# Patient Record
Sex: Female | Born: 1960
Health system: Southern US, Community
[De-identification: ages and names within clinical notes are randomized; demographics above are authoritative.]

## PROBLEM LIST (undated history)

## (undated) DIAGNOSIS — S88111A Complete traumatic amputation at level between knee and ankle, right lower leg, initial encounter: Secondary | ICD-10-CM

## (undated) DIAGNOSIS — Z800 Family history of malignant neoplasm of digestive organs: Secondary | ICD-10-CM

## (undated) DIAGNOSIS — C55 Malignant neoplasm of uterus, part unspecified: Secondary | ICD-10-CM

## (undated) DIAGNOSIS — J449 Chronic obstructive pulmonary disease, unspecified: Secondary | ICD-10-CM

## (undated) DIAGNOSIS — E669 Obesity, unspecified: Secondary | ICD-10-CM

## (undated) DIAGNOSIS — M869 Osteomyelitis, unspecified: Secondary | ICD-10-CM

## (undated) DIAGNOSIS — E039 Hypothyroidism, unspecified: Secondary | ICD-10-CM

## (undated) DIAGNOSIS — D62 Acute posthemorrhagic anemia: Secondary | ICD-10-CM

## (undated) DIAGNOSIS — E119 Type 2 diabetes mellitus without complications: Secondary | ICD-10-CM

## (undated) DIAGNOSIS — F329 Major depressive disorder, single episode, unspecified: Secondary | ICD-10-CM

## (undated) DIAGNOSIS — T8781 Dehiscence of amputation stump: Secondary | ICD-10-CM

## (undated) DIAGNOSIS — E441 Mild protein-calorie malnutrition: Secondary | ICD-10-CM

## (undated) DIAGNOSIS — M1990 Unspecified osteoarthritis, unspecified site: Secondary | ICD-10-CM

## (undated) DIAGNOSIS — Z89511 Acquired absence of right leg below knee: Secondary | ICD-10-CM

## (undated) DIAGNOSIS — F32A Depression, unspecified: Secondary | ICD-10-CM

## (undated) DIAGNOSIS — S88111D Complete traumatic amputation at level between knee and ankle, right lower leg, subsequent encounter: Secondary | ICD-10-CM

## (undated) DIAGNOSIS — I739 Peripheral vascular disease, unspecified: Secondary | ICD-10-CM

## (undated) DIAGNOSIS — N390 Urinary tract infection, site not specified: Secondary | ICD-10-CM

## (undated) DIAGNOSIS — K219 Gastro-esophageal reflux disease without esophagitis: Secondary | ICD-10-CM

## (undated) DIAGNOSIS — E785 Hyperlipidemia, unspecified: Secondary | ICD-10-CM

## (undated) DIAGNOSIS — I82409 Acute embolism and thrombosis of unspecified deep veins of unspecified lower extremity: Secondary | ICD-10-CM

## (undated) DIAGNOSIS — I10 Essential (primary) hypertension: Secondary | ICD-10-CM

## (undated) HISTORY — DX: Major depressive disorder, single episode, unspecified: F32.9

## (undated) HISTORY — DX: Acute posthemorrhagic anemia: D62

## (undated) HISTORY — DX: Essential (primary) hypertension: I10

## (undated) HISTORY — DX: Urinary tract infection, site not specified: N39.0

## (undated) HISTORY — PX: TONSILLECTOMY: SUR1361

## (undated) HISTORY — DX: Peripheral vascular disease, unspecified: I73.9

## (undated) HISTORY — DX: Malignant neoplasm of uterus, part unspecified: C55

## (undated) HISTORY — DX: Depression, unspecified: F32.A

## (undated) HISTORY — DX: Dehiscence of amputation stump: T87.81

## (undated) HISTORY — DX: Obesity, unspecified: E66.9

## (undated) HISTORY — PX: FEMORAL BYPASS: SHX50

## (undated) HISTORY — DX: Chronic obstructive pulmonary disease, unspecified: J44.9

## (undated) HISTORY — DX: Mild protein-calorie malnutrition: E44.1

## (undated) HISTORY — DX: Complete traumatic amputation at level between knee and ankle, right lower leg, subsequent encounter: S88.111D

## (undated) HISTORY — DX: Hyperlipidemia, unspecified: E78.5

## (undated) HISTORY — DX: Gastro-esophageal reflux disease without esophagitis: K21.9

## (undated) HISTORY — PX: ABDOMINAL HYSTERECTOMY: SHX81

## (undated) HISTORY — DX: Hypothyroidism, unspecified: E03.9

## (undated) HISTORY — DX: Acquired absence of right leg below knee: Z89.511

## (undated) HISTORY — DX: Acute embolism and thrombosis of unspecified deep veins of unspecified lower extremity: I82.409

## (undated) HISTORY — PX: LUMBAR DISC SURGERY: SHX700

## (undated) HISTORY — PX: TOE AMPUTATION: SHX809

## (undated) HISTORY — DX: Family history of malignant neoplasm of digestive organs: Z80.0

## (undated) HISTORY — DX: Type 2 diabetes mellitus without complications: E11.9

---
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
## 1999-01-12 ENCOUNTER — Encounter: Payer: Self-pay | Admitting: Neurosurgery

## 1999-01-12 ENCOUNTER — Ambulatory Visit (HOSPITAL_COMMUNITY): Admission: RE | Admit: 1999-01-12 | Discharge: 1999-01-12 | Payer: Self-pay | Admitting: Neurosurgery

## 1999-01-20 ENCOUNTER — Encounter: Admission: RE | Admit: 1999-01-20 | Discharge: 1999-04-01 | Payer: Self-pay | Admitting: Neurosurgery

## 2003-04-06 ENCOUNTER — Other Ambulatory Visit: Admission: RE | Admit: 2003-04-06 | Discharge: 2003-04-06 | Payer: Self-pay | Admitting: Family Medicine

## 2003-05-18 ENCOUNTER — Encounter: Admission: RE | Admit: 2003-05-18 | Discharge: 2003-05-18 | Payer: Self-pay | Admitting: Family Medicine

## 2005-06-17 ENCOUNTER — Other Ambulatory Visit: Admission: RE | Admit: 2005-06-17 | Discharge: 2005-06-17 | Payer: Self-pay | Admitting: Family Medicine

## 2006-01-10 ENCOUNTER — Emergency Department (HOSPITAL_COMMUNITY): Admission: EM | Admit: 2006-01-10 | Discharge: 2006-01-10 | Payer: Self-pay | Admitting: *Deleted

## 2006-01-20 ENCOUNTER — Ambulatory Visit (HOSPITAL_COMMUNITY): Admission: RE | Admit: 2006-01-20 | Discharge: 2006-01-20 | Payer: Self-pay | Admitting: Vascular Surgery

## 2006-01-22 ENCOUNTER — Inpatient Hospital Stay (HOSPITAL_COMMUNITY): Admission: RE | Admit: 2006-01-22 | Discharge: 2006-01-26 | Payer: Self-pay | Admitting: Vascular Surgery

## 2006-04-16 ENCOUNTER — Encounter (INDEPENDENT_AMBULATORY_CARE_PROVIDER_SITE_OTHER): Payer: Self-pay | Admitting: Specialist

## 2006-04-16 ENCOUNTER — Ambulatory Visit (HOSPITAL_COMMUNITY): Admission: RE | Admit: 2006-04-16 | Discharge: 2006-04-16 | Payer: Self-pay | Admitting: Vascular Surgery

## 2006-07-01 ENCOUNTER — Encounter: Payer: Self-pay | Admitting: Vascular Surgery

## 2006-07-01 ENCOUNTER — Inpatient Hospital Stay (HOSPITAL_COMMUNITY): Admission: AD | Admit: 2006-07-01 | Discharge: 2006-07-02 | Payer: Self-pay | Admitting: Vascular Surgery

## 2006-08-02 ENCOUNTER — Ambulatory Visit: Payer: Self-pay | Admitting: Vascular Surgery

## 2006-08-25 ENCOUNTER — Ambulatory Visit: Payer: Self-pay | Admitting: Vascular Surgery

## 2006-09-01 ENCOUNTER — Encounter: Admission: RE | Admit: 2006-09-01 | Discharge: 2006-09-01 | Payer: Self-pay | Admitting: Vascular Surgery

## 2006-09-09 ENCOUNTER — Ambulatory Visit: Payer: Self-pay | Admitting: *Deleted

## 2006-09-24 ENCOUNTER — Ambulatory Visit: Payer: Self-pay | Admitting: Vascular Surgery

## 2006-09-24 ENCOUNTER — Observation Stay (HOSPITAL_COMMUNITY): Admission: EM | Admit: 2006-09-24 | Discharge: 2006-09-25 | Payer: Self-pay | Admitting: *Deleted

## 2006-09-24 ENCOUNTER — Ambulatory Visit (HOSPITAL_COMMUNITY): Admission: RE | Admit: 2006-09-24 | Discharge: 2006-09-24 | Payer: Self-pay | Admitting: Vascular Surgery

## 2006-09-27 ENCOUNTER — Ambulatory Visit: Payer: Self-pay | Admitting: Vascular Surgery

## 2006-10-25 ENCOUNTER — Ambulatory Visit: Payer: Self-pay | Admitting: Vascular Surgery

## 2006-11-04 ENCOUNTER — Ambulatory Visit: Payer: Self-pay | Admitting: Vascular Surgery

## 2006-12-06 ENCOUNTER — Ambulatory Visit: Payer: Self-pay | Admitting: Vascular Surgery

## 2006-12-08 ENCOUNTER — Ambulatory Visit: Payer: Self-pay | Admitting: Vascular Surgery

## 2006-12-08 ENCOUNTER — Inpatient Hospital Stay (HOSPITAL_COMMUNITY): Admission: RE | Admit: 2006-12-08 | Discharge: 2006-12-11 | Payer: Self-pay | Admitting: Vascular Surgery

## 2006-12-09 ENCOUNTER — Encounter: Payer: Self-pay | Admitting: Vascular Surgery

## 2006-12-27 ENCOUNTER — Ambulatory Visit: Payer: Self-pay | Admitting: Vascular Surgery

## 2007-01-04 ENCOUNTER — Ambulatory Visit: Payer: Self-pay | Admitting: *Deleted

## 2007-01-04 ENCOUNTER — Ambulatory Visit: Payer: Self-pay | Admitting: Vascular Surgery

## 2007-01-07 ENCOUNTER — Ambulatory Visit: Payer: Self-pay | Admitting: Vascular Surgery

## 2007-01-08 ENCOUNTER — Inpatient Hospital Stay (HOSPITAL_COMMUNITY): Admission: RE | Admit: 2007-01-08 | Discharge: 2007-01-18 | Payer: Self-pay | Admitting: Vascular Surgery

## 2007-01-10 ENCOUNTER — Encounter: Payer: Self-pay | Admitting: Vascular Surgery

## 2007-01-11 ENCOUNTER — Ambulatory Visit: Payer: Self-pay | Admitting: Vascular Surgery

## 2007-01-28 ENCOUNTER — Ambulatory Visit: Payer: Self-pay | Admitting: Vascular Surgery

## 2007-03-04 ENCOUNTER — Ambulatory Visit: Payer: Self-pay | Admitting: Vascular Surgery

## 2007-03-16 ENCOUNTER — Encounter: Admission: RE | Admit: 2007-03-16 | Discharge: 2007-03-16 | Payer: Self-pay | Admitting: Family Medicine

## 2007-04-05 ENCOUNTER — Emergency Department (HOSPITAL_COMMUNITY): Admission: EM | Admit: 2007-04-05 | Discharge: 2007-04-05 | Payer: Self-pay | Admitting: *Deleted

## 2007-04-29 ENCOUNTER — Ambulatory Visit: Payer: Self-pay | Admitting: Vascular Surgery

## 2007-06-21 ENCOUNTER — Ambulatory Visit: Payer: Self-pay | Admitting: Gastroenterology

## 2007-08-05 ENCOUNTER — Ambulatory Visit: Payer: Self-pay | Admitting: Vascular Surgery

## 2007-08-30 ENCOUNTER — Encounter: Admission: RE | Admit: 2007-08-30 | Discharge: 2007-11-28 | Payer: Self-pay | Admitting: Family Medicine

## 2008-03-30 ENCOUNTER — Ambulatory Visit: Payer: Self-pay | Admitting: Vascular Surgery

## 2008-04-18 DIAGNOSIS — I739 Peripheral vascular disease, unspecified: Secondary | ICD-10-CM

## 2008-04-18 DIAGNOSIS — L089 Local infection of the skin and subcutaneous tissue, unspecified: Secondary | ICD-10-CM

## 2008-04-18 DIAGNOSIS — E669 Obesity, unspecified: Secondary | ICD-10-CM | POA: Insufficient documentation

## 2008-04-18 DIAGNOSIS — E785 Hyperlipidemia, unspecified: Secondary | ICD-10-CM

## 2008-04-18 DIAGNOSIS — E1169 Type 2 diabetes mellitus with other specified complication: Secondary | ICD-10-CM | POA: Insufficient documentation

## 2008-04-18 DIAGNOSIS — E11628 Type 2 diabetes mellitus with other skin complications: Secondary | ICD-10-CM

## 2008-04-18 DIAGNOSIS — F17200 Nicotine dependence, unspecified, uncomplicated: Secondary | ICD-10-CM

## 2008-04-18 DIAGNOSIS — I10 Essential (primary) hypertension: Secondary | ICD-10-CM | POA: Insufficient documentation

## 2008-09-14 ENCOUNTER — Ambulatory Visit: Payer: Self-pay | Admitting: Vascular Surgery

## 2009-04-17 ENCOUNTER — Ambulatory Visit: Payer: Self-pay | Admitting: Surgery

## 2009-04-17 ENCOUNTER — Emergency Department (HOSPITAL_COMMUNITY): Admission: EM | Admit: 2009-04-17 | Discharge: 2009-04-17 | Payer: Self-pay | Admitting: Family Medicine

## 2009-04-17 ENCOUNTER — Encounter (INDEPENDENT_AMBULATORY_CARE_PROVIDER_SITE_OTHER): Payer: Self-pay | Admitting: Emergency Medicine

## 2009-04-17 ENCOUNTER — Emergency Department (HOSPITAL_COMMUNITY): Admission: EM | Admit: 2009-04-17 | Discharge: 2009-04-17 | Payer: Self-pay | Admitting: Emergency Medicine

## 2009-05-17 ENCOUNTER — Ambulatory Visit: Payer: Self-pay | Admitting: Vascular Surgery

## 2009-11-29 ENCOUNTER — Ambulatory Visit: Payer: Self-pay | Admitting: Vascular Surgery

## 2010-06-03 ENCOUNTER — Ambulatory Visit: Payer: Self-pay | Admitting: Vascular Surgery

## 2010-07-08 ENCOUNTER — Ambulatory Visit
Admission: RE | Admit: 2010-07-08 | Discharge: 2010-07-08 | Payer: Self-pay | Source: Home / Self Care | Attending: Vascular Surgery | Admitting: Vascular Surgery

## 2010-07-09 NOTE — Assessment & Plan Note (Addendum)
OFFICE VISIT  Pam, Cox DOB:  09/10/1960                                       07/08/2010 ZOXWR#:[id number]  The patient presents today for followup of her lower extremity peripheral vascular occlusive disease.  She has a very complex past history, presenting initially with atheroemboli to her right fourth and fifth toes.  This was in August 2007.  Workup revealed high-grade stenosis in her superficial femoral artery.  It was felt she had embolized from this.  She underwent right femoral popliteal bypass and eventual toe amputation.  She has had multiple Delina Kruczek failures of her fem- pop bypass.  Most recently, she had redo bypass with vein from her left leg.  This was in August 2008.  On her last protocol visit 6 months ago, she did have drops in her ankle-arm index but did have a patent bypass and it was recommended that we continue observation only.  She was seen in our office yesterday for vascular lab followup and was found to have an occlusion of her graft.  Interestingly, her last ankle-arm index was 0.37 and with the graft occluded by duplex is 0.39.  Fortunately, she is having no symptoms related to this and really is walking without any claudication symptoms, having no rest pain, and is quite comfortable.  I am quite pleased with this and explained to the patient that this really is the best possible outcome since she is not at any risk for occlusion with the graft failed.  She, on physical exam, has well-perfused feet bilaterally with no evidence of tissue loss.  I discussed this with the patient and recommended continued observation only.  She knows to notify us immediately should she develop any tissue loss or rest pain. Otherwise, we will see her again in 6 months for continued followup of her moderate to severe ischemia.    [name], M.D. Electronically Signed  TFE/MEDQ  D:  07/08/2010  T:  07/09/2010  Job:  [id number]

## 2010-07-17 NOTE — Procedures (Unsigned)
BYPASS GRAFT EVALUATION  INDICATION:  Followup right fem-pop graft.  HISTORY: The patient fell in December and reports bruising of distal surgical scar but states she is feeling better than ever. Diabetes:  Yes. Cardiac:  No. Hypertension:  Yes. Smoking:  Current. Previous Surgery:  Right fem-pop bypass graft with multiple revisions, most recent performed on 01/21/2007, it was a right tibial embolectomy/VPA/PTA.  SINGLE LEVEL ARTERIAL EXAM                              RIGHT              LEFT Brachial:                    127                134 Anterior tibial:             46                 102 Posterior tibial:            52                 105 Peroneal: Ankle/brachial index:        0.39               0.78  PREVIOUS ABI:  Date:  11/29/2009  RIGHT:  0.37  LEFT:  0.79  LOWER EXTREMITY BYPASS GRAFT DUPLEX EXAM:  DUPLEX:  Patent right CFA however no flow can be detected in the right fem-pop graft. Arterial flow appears to be provided to the lower extremity via the right deep femoral artery. Difficult visualization due to groin scarring.  IMPRESSION: 1. Stable bilateral ankle brachial index. 2. Occluded right femoral-popliteal graft. 3. Results are communicated to Dr. Myra Gianotti.  The patient is set up to     see Dr. Arbie Cookey at her earliest convenience.  ___________________________________________ [name], M.D.  LT/MEDQ  D:  07/07/2010  T:  07/07/2010  Job:  [id number]

## 2010-09-10 LAB — POCT I-STAT, CHEM 8
Chloride: 104 mEq/L (ref 96–112)
HCT: 39 % (ref 36.0–46.0)
Potassium: 3.6 mEq/L (ref 3.5–5.1)
Sodium: 139 mEq/L (ref 135–145)

## 2010-09-10 LAB — APTT: aPTT: 44 seconds — ABNORMAL HIGH (ref 24–37)

## 2010-09-10 LAB — PROTIME-INR: INR: 2.85 — ABNORMAL HIGH (ref 0.00–1.49)

## 2010-10-21 NOTE — Procedures (Signed)
BYPASS GRAFT EVALUATION   INDICATION:  Followup evaluation of right leg bypass graft, right thigh  pain.   HISTORY:  Diabetes:  Orally controlled.  Cardiac:  No.  Hypertension:  Yes.  Smoking:  Two packs per day for 30 years.  Previous Surgery:  Thrombectomy and revision of distal anastomosis of  right fem-pop bypass graft on June 30, 2006; PTA of bypass graft on  September 24, 2006, by Dr. Arbie Cookey.   SINGLE LEVEL ARTERIAL EXAM                               RIGHT              LEFT  Brachial:                    148.               168.  Anterior tibial:             39.                71.  Posterior tibial:            52.                107.  Peroneal:  Ankle/brachial index:        0.31.              0.64.   PREVIOUS ABI:  Date:  Nov 04, 2006.  RIGHT:  0.90  LEFT:  0.62   LOWER EXTREMITY BYPASS GRAFT DUPLEX EXAM:   DUPLEX:  No flow is identified within the right fem-pop bypass graft.   IMPRESSION:  1. Occluded right femoral-popliteal bypass graft.  2. Left ankle-brachial index (ABI) is stable from previous study.   ___________________________________________  [name], M.D.   MC/MEDQ  D:  12/06/2006  T:  12/06/2006  Job:  [id number]

## 2010-10-21 NOTE — Assessment & Plan Note (Signed)
OFFICE VISIT   DARWIN, GUASTELLA  DOB:  Feb 18, 1961                                       05/17/2009  UYQIH#:[id number]   Patient presents today for continued follow-up of her right fem-pop  bypass with multiple restenoses, tibial embolectomies in the past.  She  had been maintained on Coumadin for her graft patency.  She recently was  placed on Crestor and had a marked elevation in her Coumadin level with  a resulting hematoma in her right anterior thigh.  This is now resolved.   She underwent noninvasive vascular laboratory studies today is in our  office, and this reveals no change in her ankle-arm index bilaterally.  Her ankle-arm index on the right is 0.45, on the left is 0.76.  Her  graft is patent throughout its course by ultrasound.   She will continue her follow-up in our vascular lab protocol.  I feel it  is okay for her to undergo oral surgery as indicated.  She tells me that  Dr. Manson Passey does not need to stop her Coumadin for this, since he is bone-  grafting after he pulls her tooth.  She will continue to see [place] in our  vascular lab protocol.   [name], M.D.  Electronically Signed   TFE/MEDQ  D:  05/17/2009  T:  05/21/2009  Job:  [id number]   cc:   Grant Ruts., D.D.S.

## 2010-10-21 NOTE — Procedures (Signed)
BYPASS GRAFT EVALUATION   INDICATION:  Followup, status post revision to the right fem-pop bypass  graft as well as tibial embolectomy VPA/PTA from August, 2008 by Dr.  Arbie Cookey.   HISTORY:  Diabetes:  Yes.  Cardiac:  No.  Hypertension:  Yes.  Smoking:  Quit.  Previous Surgery:  See above.  In July, 2008, redo right fem-pop bypass  graft.  In April, 2008, revision of right fem-pop bypass graft.  In  January, 2008, right fem-pop graft.   SINGLE LEVEL ARTERIAL EXAM                               RIGHT              LEFT  Brachial:                    165                159  Anterior tibial:             97                 125  Posterior tibial:            105                125  Peroneal:                    123  Ankle/brachial index:        0.75               0.76   PREVIOUS ABI:  Date: 03/04/2007  RIGHT:  0.67  LEFT:  0.52   LOWER EXTREMITY BYPASS GRAFT DUPLEX EXAM:  See attached sheet for  velocities.   DUPLEX:  Doppler arterial waveforms appear biphasic proximal to, within,  and distal to bypass graft.  Nonvascularized complex structures  suggestive of hematomas in the right groin and the medial aspect of the  knee.   IMPRESSION:  1. Stable bilateral ankle brachial indices from previous study with      strong monophasic (near biphasic) flow bilaterally.  2. Patent right femoropopliteal bypass graft.  3. Technically difficult to image sections due to depth and body      habitus.   ___________________________________________  [name], M.D.   AS/MEDQ  D:  04/29/2007  T:  04/30/2007  Job:  [id number]

## 2010-10-21 NOTE — Assessment & Plan Note (Signed)
OFFICE VISIT   [name], [name]  DOB:  06-Apr-1961                                       03/04/2007  ZOXWR#:[id number]   The patient presents today for followup of her right fem-pop bypass.  She has had multiple prior graft difficulties with occlusions.  She did  have new bypass of the right fem-pop with vein to her left leg on  01/11/2007.  She quit smoking and has not smoked since July of 2008.  She looks quite good today.  She has good spirits.  She has completely  healed all of her incisions.  She is walking with no claudication  symptoms.  She does have a palpable popliteal pulse.  Ankle brachial  index is stable at 0.67 on the right and 0.52 on the left.  She is to  notify us if she has any difficulty.  Otherwise, we will see her again  in November with repeat vascular lab protocol.  She is on Coumadin  therapy and we will continue this, at least on the short term.   [name], M.D.  Electronically Signed   TFE/MEDQ  D:  03/04/2007  T:  03/04/2007  Job:  [id number]

## 2010-10-21 NOTE — Assessment & Plan Note (Signed)
OFFICE VISIT   ELANTRA, CAPRARA  DOB:  Feb 11, 1961                                       08/05/2007  LKGMW#:[id number]   [name] presents today for continued followup of her right  fem-pop bypass.  She has a new diagnosis of hypothyroidism and has been  on thyroid replacement medication for 2 weeks.  She is concerned  regarding her continued weight gain.  Fortunately,  she continues to not  smoke.  She does report total legs, both sides, giving out with  walking and also reports numbness and tingling in her thighs with  walking and at rest.  She does have known degenerative disk disease and  I suspect this is the etiology for this.   PHYSICAL EXAM:  She does have obesity.  I do not palpable pedal pulses.  She underwent Duplex today.  This does show patent bypass graft on the  right.  She does have slightly elevated velocities at the distal  anastomosis, ankle arm index 0.5 on the right and 0.7 on the left.   I have encouraged [name] to continue her exercise program as  much as possible and plan to see her again in 3 months for continued  graft followup.   [name], M.D.  Electronically Signed   TFE/MEDQ  D:  08/05/2007  T:  08/08/2007  Job:  [id number]

## 2010-10-21 NOTE — Assessment & Plan Note (Signed)
OFFICE VISIT   [name], [name]  DOB:  1960/12/28                                       11/29/2009  MVHQI#:[id number]   Patient presents today for continued follow-up of her lower extremity  insufficiency and also for noninvasive vascular laboratory study.  She  has a patent right femoral to popliteal bypass.  She has multiple  revisions of this and has known tibial disease.  She reports that she is  in a swimming program and is walking with very mild claudication  symptoms.  She has night cramps in her left, more so than her right,  calf.  She does not have any tissue loss.  She did have significant  swelling in her right leg, and this is improving over time as well.   She underwent noninvasive vascular laboratory study in our office today,  and this reveals a slight drop her right pressure 0.37, down from 0.45  six months ago and the left stable at 0.79, as compared to 0.76 in  December 2010.   Her physical exam is unchanged.  She does not have palpable pedal  pulses.  She does not have any tissue loss.  Has a well-healed toe  amputation on the right.   I plan to continue to follow her on a noninvasive vascular lab protocol.     [name], M.D.  Electronically Signed   TFE/MEDQ  D:  11/29/2009  T:  11/29/2009  Job:  [id number]

## 2010-10-21 NOTE — Procedures (Signed)
BYPASS GRAFT EVALUATION   INDICATION:  Followup lower extremity bypass graft.   HISTORY:  Diabetes:  Yes.  Cardiac:  No.  Hypertension:  Yes.  Smoking:  Yes.  Previous Surgery:  Right fem-pop bypass graft with multiple revisions,  most recent on 01/11/2007, right tibial embolectomy/VPA/PTA on  01/11/2007.   SINGLE LEVEL ARTERIAL EXAM                               RIGHT              LEFT  Brachial:                    150                141  Anterior tibial:             59                 86  Posterior tibial:            66                 107  Peroneal:  Ankle/brachial index:        0.44               0.71   PREVIOUS ABI:  Date:  03/30/2008  RIGHT:  0.48  LEFT:  0.64   LOWER EXTREMITY BYPASS GRAFT DUPLEX EXAM:   DUPLEX:  Patent right fem-pop bypass graft with no evidence of focal  stenosis.  Biphasic duplex waveform noted within graft and native arteries.   IMPRESSION:  1. Right lower extremity ABI suggests moderate to severe arterial      disease.  2. Left lower extremity ABI suggests moderate arterial disease.       ___________________________________________  [name], M.D.   AC/MEDQ  D:  09/14/2008  T:  09/14/2008  Job:  [id number]

## 2010-10-21 NOTE — Discharge Summary (Signed)
NAMEHAZELL, [name]        ACCOUNT NO.:  [id number]   MEDICAL RECORD NO.:  [id number]          PATIENT TYPE:  INP   LOCATION:  2038                         FACILITY:  [facility]   PHYSICIAN:  [name], M.D.    DATE OF BIRTH:  05/26/1961   DATE OF ADMISSION:  01/07/2007  DATE OF DISCHARGE:  01/18/2007                               DISCHARGE SUMMARY   FINAL DIAGNOSIS:  Recurrent ischemia of right lower extremity.   HISTORY OF PRESENT ILLNESS:  The patient is a 50 year old white female  patient undergoing right femoral to below-the-knee popliteal bypass with  saphenous vein in August of 2007.  She had presented with gangrenous  changes of her toes.  Underwent toe amputation following this, which  eventually healed.  She subsequently had occlusion of this and underwent  revision in January of 2008, with a replacement with a prosthetic graft.  She presented on January 07, 2007 with a 24-hour history of increasing  coolness in her right foot, with obvious occlusion of the graft.  She  did have an open groin wound and had been on Keflex 500 mg b.i.d. for  this.  She was also undergoing local wound care.   PAST HISTORY:  Significant for:  1. Diabetes.  2. Hypertension.  3. Elevated cholesterol.   FAMILY HISTORY:  Significant for cardiac disease.   SOCIAL HISTORY:  Recently quit smoking.   MEDICATIONS ON ADMISSION AND DISCHARGE:  1. Nexium 40 mg b.i.d.  2. Zyrtec 10 mg twice a day.  3. Benicar 20 mg once daily.  4. Macrobid 100 mg once daily.  5. Flonase p.r.n.  6. Lipitor 20 mg once daily.  7. Oxycodone 5/500 p.r.n.  8. She was discharged on Coumadin to be regulated in the outpatient      [facility].   HOSPITAL COURSE:  The patient was admitted and underwent attempted  thrombolysis of her prosthetic graft.  This was initially successful and  has no evidence of anastomotic difficulties with the upper or lower  anastomosis.  She was continued on heparin and Coumadin.   Despite  maintaining her heparin and Coumadin, she reoccluded her graft on January 10, 2007 in the afternoon, with recurrent ischemia.  I discussed the  options for [name], and recommend that she undergo  replacement with a vein graft with her early reocclusion, with no  evidence of technical difficulty.  She had imaging of her left leg vein,  and this showed this is acceptable for bypass.  She was taken to the  operating room on the morning of January 11, 2007, where she underwent  harvesting of her left leg great saphenous vein, and had a redo right  femoral tibial below-the-knee popliteal bypass on the right, using vein  from her left.  On opening her below-the-knee popliteal artery, she was  found to have an occlusion of her anterior tibia, posterior tibial, and  perineal arteries, and these underwent individual thrombectomies as  well.  The patient was maintained on heparin and Coumadin  postoperatively, and did well.  She slowly mobilized.  She was continued  on Zinacef for 3 days postoperatively.  She was working with physical  therapy.  __________  index was 0.78 on the right.  It was up from  inaudible, and maintained stable at 0.61 on the left postoperatively.  Her Coumadin was adjusted to therapeutic.  She was mobilizing  independently and was discharged to home on January 18, 2007, with  instructions for followup in my office in 2 weeks.      [name], M.D.  Electronically Signed     TFE/MEDQ  D:  02/22/2007  T:  02/22/2007  Job:  [id number]

## 2010-10-21 NOTE — Procedures (Signed)
BYPASS GRAFT EVALUATION   INDICATION:  Follow up right femoral-popliteal artery bypass graft with  multiple revisions.   HISTORY:  Diabetes:  Yes.  Cardiac:  No.  Hypertension:  Yes.  Smoking:  Quit.  Previous Surgery:  July, 2008, re-do right fem-pop artery bypass graft  and tibial embolectomy VPA/PTA from August, 2008, April, 2008 revision  of right femoral-popliteal artery bypass graft, January, 2008 right  femoral - popliteal artery bypass graft.   SINGLE LEVEL ARTERIAL EXAM                               RIGHT              LEFT  Brachial:                    162                152  Anterior tibial:             62                 110  Posterior tibial:            Inaudible          115  Peroneal:                    82  Ankle/brachial index:        0.51               0.71   PREVIOUS ABI:  Date: 04/29/2007  RIGHT:  0.75  LEFT:  0.76   LOWER EXTREMITY BYPASS GRAFT DUPLEX EXAM:   DUPLEX:  1. The Doppler arterial wave forms appear biphasic proximal to, within      and distal to bypass graft; however, elevated velocities of 149      cm/s in distal anastomosis/native compared to distal graft at 45      cm/s, suggestive of greater than 50% stenosis.  2. Known hematoma-like non-vascularized structures in right groin and      medial aspect of knee.   IMPRESSION:  1. Right ABI has decreased from previous study and was difficult to      obtain.  2. Left ABI appears stable.  3. Patent right femoral-popliteal artery bypass graft; however,      elevated velocities at the distal anastomosis/native suggestive of      greater than 50% stenosis.  4. Technically difficult study due to depth and body habitus.   ___________________________________________  [name], M.D.   AS/MEDQ  D:  08/05/2007  T:  08/07/2007  Job:  [id number]

## 2010-10-21 NOTE — H&P (Signed)
Cox, Pam        ACCOUNT NO.:  [id number]   MEDICAL RECORD NO.:  [id number]          PATIENT TYPE:  OIB   LOCATION:  2860                         FACILITY:  [facility]   PHYSICIAN:  Janetta Hora. Fields, MD  DATE OF BIRTH:  07-Jul-1960   DATE OF ADMISSION:  01/07/2007  DATE OF DISCHARGE:                              HISTORY & PHYSICAL   REASON FOR ADMISSION:  Right lower extremity ischemia with occlusion of  femoral-popliteal bypass.   HISTORY OF PRESENT ILLNESS:  Patient is a 50 year old female who  previously has undergone right femoral-to-below knee popliteal bypass  with saphenous vein in August, 2007, followed by thrombectomy and  revision of this in January, 2008, followed by redo right femoral-to-  below-knee popliteal bypass in July, 2008.  All of these were done by  Dr. Arbie Cookey.  Approximately 24 hours ago, she began to experience some  numbness in her right foot.  She presented to the office today.  Duplex  scan showed that her bypass was occluded.  She is currently on Keflex  500 mg twice daily for an open groin wound.  She is doing wound care,  normal saline wet-to-dry, of this.   PAST MEDICAL HISTORY:  1. Diabetes.  2. Hypertension.  3. Elevated cholesterol.   FAMILY HISTORY:  Cardiac disease.   She recently quit smoking.   MEDICATIONS:  1. Nexium 40 mg once daily.  2. Zyrtec 10 mg twice daily.  3. Benicar 20 mg once daily.  4. Macrobid 100 mg once daily.  5. Flonase p.r.n.  6. Lipitor 20 mg once daily.  7. Oxycodone 5/500 p.r.n.  8. Keflex 500 mg twice daily.   PHYSICAL EXAMINATION:  Blood pressure is 138/80.  Heart rate is 86 and  regular.  HEENT:  Unremarkable.  CHEST:  Clear to auscultation.  CARDIAC:  Regular rate and rhythm.  ABDOMEN:  Obese, soft and nontender.  LOWER EXTREMITIES:  She has 2+ femoral pulses bilaterally.  She has an  open wound in the right groin, which is slightly separated, but there is  some granulation tissue in this  area.  There is no purulent drainage.  There is no erythema.  Right foot is cool and dusky in appearance.  NEURO:  Intact.  VASCULAR:  She does have decreased sensation to light touch.  Left foot  is warm and adequately perfused. She has no palpable popliteal or pedal  pulses on the left side.   ABIs today were 0.29 on the right and 0.63 on the left.   ASSESSMENT:  Occlusion of right femoral-popliteal graft with some  ischemia symptoms.  I believe the best option for her is lysis of her  graft due to her multiple previous occlusions and the relatively early-  on failure of this bypass graft.  Also, since she has an open wound in  the right groin, it is not an ideal time to explore her prosthetic  graft, as this would be at high risk of infection.  Additionally, we  will send off a hypercoagulable profile and consider putting her on  Coumadin if able to open the bypass with lytic therapy.  Janetta Hora. Fields, MD  Electronically Signed     CEF/MEDQ  D:  01/07/2007  T:  01/07/2007  Job:  [id number]

## 2010-10-21 NOTE — Assessment & Plan Note (Signed)
OFFICE VISIT   GEORGEAN, SPAINHOWER  DOB:  1961/01/08                                       09/14/2008  ZOXWR#:[id number]   The patient presents today for continued follow-up of her extensive  prior revascularization for her right lower extremity.  She initially  presented to 2007 with gangrenous changes on her toes and she  subsequently underwent right femoral to popliteal bypass and toe  amputation.  She has had subsequent occlusion and revision.  Most  recently, she underwent right femoral to below-knee popliteal bypass  with saphenous vein harvest from her left leg in August 2008.  She has  done well since that time.  She continues to have bilateral calf  claudication.  This is not limiting to her.  Unfortunately, she  continues to smoke cigarettes as well.   On physical exam, her incisions are all well-healed.  She does have a  palpable popliteal pulse on the right and no pulses in her right foot.  She has no tissue loss in her right foot.   Her noninvasive vascular laboratories are stable with an ankle arm index  of 0.44 on the right and 0.71 on the left.  Her graft scan shows normal  flow throughout her graft.   I am pleased with her continued progress as is the patient.  I again had  a long discussion regarding the critical importance of smoking cessation  to her.  She will continue to follow up in our noninvasive vascular  laboratory study.   [name], M.D.  Electronically Signed   TFE/MEDQ  D:  09/14/2008  T:  09/17/2008  Job:  [id number]

## 2010-10-21 NOTE — Discharge Summary (Signed)
NAMEJAHNIAH, [name]        ACCOUNT NO.:  [id number]   MEDICAL RECORD NO.:  [id number]          PATIENT TYPE:  INP   LOCATION:  2002                         FACILITY:  [facility]   PHYSICIAN:  [name], M.D.    DATE OF BIRTH:  [birth year]   DATE OF ADMISSION:  12/08/2006  DATE OF DISCHARGE:  12/11/2006                               DISCHARGE SUMMARY   ADMISSION DIAGNOSIS:  Recurrent occlusion, right femoral-popliteal  bypass vein graft.   SECONDARY DIAGNOSES:  1. Recurrent occlusion of right popliteal vein bypass status post re-      do right femoral-popliteal bypass.  2. Postoperative hypotension, improved.  3. Suspected candidiasis left groin treated with Nystatin.  4. Right foot pain with history of gout.  5. Diabetes mellitus type 2.  6. Hypertension.  7. Hyperlipidemia.  8. History of tobacco abuse.  9. History of heart murmur as a child.  10.Renal cyst.  11.Recurrent urinary tract infection.  12.Gastroesophageal reflux disease.  13.History of peripheral vascular occlusive disease status post right      femoral to below-knee popliteal artery bypass graft using reverse      saphenous vein graft, January 22, 2006, status post right toe      amputation, fourth and fifth, April 16, 2006, status post      thrombectomy right femoral-popliteal bypass graft with exploration      of proximal and distal anastomosis with vein patch angioplasty of      distal site.  14.History of Cesarean section.  15.History of lumbar spine surgery.  16.History of pulmonary nodule felt most likely granulomatous disease.   ALLERGIES:  PREDNISONE AND CORTISONE.   PROCEDURES:  On 12/08/2006 exploration of right femoral below-knee  popliteal vein bypass followed by placement of new right femoral to  below-knee popliteal Propaten Gore-Tex femoral-popliteal graft by Dr.  Arbie Cookey.   BRIEF HISTORY:  The patient is a 50 year old Caucasian female with  recurrent graft occlusion of the right leg.   She has recently undergone  femoral-popliteal bypass with saphenous vein in August 2007 when she  presented with ischemia and gangrenous changes of her right fourth and  fifth toes.  She underwent fourth and fifth toe amputation in November  2007 with eventual healing.  She underwent early re-occlusion of her  graft in January, and underwent thrombectomy exploration of her proximal  and distal anastomosis and distal vein patch angioplasty.  She then had  recurrent ischemia.  An arteriogram in April 2008 revealed a critical  stenosis in the mid portion of her vein graft which was successfully  treated with dilatation.  She was seen in routine follow-up and found to  have re-occluded right femoral-popliteal bypass graft.  Dr. Arbie Cookey  recommended that she be admitted on 12/08/2006 for exploration and  possible replacement of her prosthetic graft.   HOSPITAL COURSE:  The patient was admitted electively to [facility] 12/08/2006 and underwent the previously mentioned procedure.  She was extubated and neurologically intact and remained stable other  than her blood pressure was somewhat low in the recovery unit with  systolic in the 80's.  This was treated with normal  saline bolus and she  was transferred to the surgical intensive care unit overnight for closer  monitoring.  Her Benicar was placed on hold.  She is asymptomatic of her  hypotension.  The following day her pressure showed improvement with  systolic blood pressure in the 90's.  She had she was felt appropriate  transferred to telemetry unit 2000 and mobilized, and later blood  pressures were ranging between 115 and 130 with diastolic in the 60's to  80's.  Her oxygen saturation was 92% on room air.  She was in sinus  rhythm although slightly tachycardiac at times in the low 100's.  She  was afebrile.  Her postoperative labs were stable showing mild acute  blood loss anemia which did not feel that she would require  transfusion.  She showed specifically hemoglobin 9.9, hematocrit 29.3, platelet count  184, white blood count of 9.1.  Sodium was 137, potassium 3.9, chloride  104, CO2 27, blood glucose 141, BUN 9, creatinine 0.87, calcium 8.2.  postoperatively she was seen by Physical Therapy who felt that depending  on her progress, she may or may not need home health physical therapy.  We will follow this and make recommendations as appropriate.  Her foot  has remained warm with positive Doppler signals of her posterior tibial  and dorsalis pedis arteries.  Her incisions appear to be healing without  signs of infection.  She was tolerating p.o.'s and pain has been  controlled on oral medication.  She has reported some right foot pain  without injury that was present since July 1st.  She was concerned that  she may be having gout exacerbation and uric acid level has been ordered  but is still pending.  There is no significant erythema of her leg  except some minimal right great toe erythema.  She did report some yeast  irritation of her left groin which was treated with nystatin powder.  Her sugars have been controlled on her home regimen and NovoLog insulin  sliding scale and most recently range between about 150 and 170.  If the  patient continues to make steady progress, and there are no significant  her status, it is anticipated she will be ready for discharge home on  postoperative day #3, 12/11/2006.   DISCHARGE MEDICATIONS:  1. Benicar 20 mg p.o. daily.  2. Macrobid 100 mg p.o. daily for which she stays on chronically.  3. Avandaryl 4/4 milligrams p.o. daily.  4. Plavix 75 mg daily.  5. Lipitor 20 mg p.o. daily.  6. Aspirin 325 mg p.o. daily.  7. Stool softener 100 mg,  2 tablets p.o. daily.  8. Chantix 1 mg p.o. b.i.d.  9. Oxycodone 5 mg, 1 to 2 tablets p.o. q 4 hours.   DISCHARGE INSTRUCTIONS:  She is to continue diabetic appropriate diet.  She can increase her activity slowly, avoid  driving or heavy lifting for  the next three weeks.  She may shower and clean her incisions gently  with soap and water.  She should call if she develops fever greater than  101, redness or drainage from her incision site or increased pain.  She  will see Dr. Arbie Cookey at the [facility] office in  approximately three weeks with postoperative ABI;s.  His office will  contact her regarding specific appointment date and time.      Jerold Coombe, P.A.      [name], M.D.  Electronically Signed   AWZ/MEDQ  D:  12/10/2006  T:  12/10/2006  Job:  [id number]

## 2010-10-21 NOTE — Assessment & Plan Note (Signed)
OFFICE VISIT   [name], [name]  DOB:  [birth year]  AYTKZ#:[id number]   [name] returns today for examination of the right inguinal  wound where she has had a slowly healing wound being packed daily.  This  was examined closely and there is good granulation tissue with no deep  extension.  The wound now measures only about 4 cm by 1-1/2 to 2 cm in  depth.  This is being packed twice daily.  Instructed her to continue  the same wound care and return to see Dr. Arbie Cookey next week as scheduled.   Quita Skye Hart Rochester, M.D.  Electronically Signed   JDL/MEDQ  D:  01/04/2007  T:  01/06/2007  Job:  [id number]

## 2010-10-21 NOTE — Procedures (Signed)
BYPASS GRAFT EVALUATION   INDICATION:  Followup right lower extremity bypass graft.   HISTORY:  Diabetes:  Yes.  Cardiac:  No.  Hypertension:  Yes.  Smoking:  Yes.  Previous Surgery:  Right femoral-popliteal artery bypass graft with  multiple revisions, most recent 01/11/2007.  Right tibial embolectomy /  VPA / PTA on 01/11/2007.   SINGLE LEVEL ARTERIAL EXAM                               RIGHT              LEFT  Brachial:                    146                139  Anterior tibial:             58                 106  Posterior tibial:            65                 111  Peroneal:  Ankle/brachial index:        0.45               0.76   PREVIOUS ABI:  Date:  09/14/2008  RIGHT:  0.44  LEFT:  0.71   LOWER EXTREMITY BYPASS GRAFT DUPLEX EXAM:   DUPLEX:  Patent right femoral-popliteal artery bypass graft with no  evidence for restenosis within graft.  Elevated velocities in native artery distal to graft of 190 cm/s with  flow as low as 26 cm/s in graft.   IMPRESSION:  1. Patent right femoral-popliteal artery bypass graft.  2. Elevated velocities in native artery distal to graft.  3. Bilateral ankle brachial indices appear stable from previous study      with monophasic flow.   ___________________________________________  [name], M.D.   AS/MEDQ  D:  05/17/2009  T:  05/17/2009  Job:  [id number]

## 2010-10-21 NOTE — Assessment & Plan Note (Signed)
OFFICE VISIT   [name], KANNER  DOB:  07-03-1960                                       12/27/2006  [id number]#:[id number]   The patient is a walk-in today with concerns regarding her right groin.  She is status post several right fem-pop bypasses for limb salvage.  Most recently she underwent replacement of a new right femoral to below  knee popliteal Propaten bypass graft on December 08, 2006.  She had palpable  popliteal graft pulse.  She is walking without any difficulty.  She does  have separation of her groin wound.  She is obese with a large  peniculus.  This area was debrided with a Vicryl suture being removed  and also had some debridement of some fat necrosis.  There does not  appear to be a deep space or graft involvement.  She will begin b.i.d.  normal saline dressing changes and I have started her on Keflex 500 mg  b.i.d.  for 2 weeks.  She will see Dr. Hart Rochester in 1 week in my absence  and I will see her back following this.  She denies any fevers, does not  have any surrounding erythema.   [name], M.D.  Electronically Signed   TFE/MEDQ  D:  12/27/2006  T:  12/28/2006  Job:  [id number]

## 2010-10-21 NOTE — Procedures (Signed)
BYPASS GRAFT EVALUATION   INDICATION:  Follow up right femoral-to-popliteal bypass graft.   HISTORY:  Diabetes:  Yes.  Cardiac:  No.  Hypertension:  Yes.  Smoking:  Yes.  Previous Surgery:  Right femoral-to-popliteal artery bypass graft with  multiple revisions, most recent 01/11/07, right tibial  embolectomy/VTA/PTA on 01/11/07.   SINGLE LEVEL ARTERIAL EXAM                               RIGHT              LEFT  Brachial:                    127                134  Anterior tibial:             50                 100  Posterior tibial:            44                 106  Peroneal:  Ankle/brachial index:        0.37               0.79   PREVIOUS ABI:  Date: 05/17/09  RIGHT:  0.45  LEFT:  0.76   LOWER EXTREMITY BYPASS GRAFT DUPLEX EXAM:   DUPLEX:  Patent right femoral-to-popliteal artery bypass graft with no  evidence of restenosis within the graft itself.  There are elevated  velocities of 143 cm at the distal anastomosis.  Difficult to image the  distal outflow level.   IMPRESSION:  1. Right ankle brachial index suggests severe arterial disease.  2. Left ankle brachial index suggests mild arterial disease.  3. Patent right femoral to popliteal artery bypass graft with elevated      velocities as described above.   ___________________________________________  [name], M.D.   CB/MEDQ  D:  11/29/2009  T:  11/29/2009  Job:  [id number]

## 2010-10-21 NOTE — Assessment & Plan Note (Signed)
OFFICE VISIT   [name], [name]  DOB:  Sep 18, 1960                                       01/28/2007  JYNWG#:[id number]   The patient presents today for followup of her recent hospitalization  after an occluded Gore-Tex right fem-pop bypass.  She initially had  thrombolysis of this after she had presented with an occluded graft on  08/01.  She had initially successful thrombolysis with a widely patent  graft at 3-vessel runoff and no evidence of technical difficulties.  She  was started on heparin and Coumadin conversion and 3 days following her  successful lysis, she had re-occlusion of her right Gore-Tex fem-pop  bypass.  After a prolonged discussion, it was recommended that she  undergo harvest of her left leg saphenous vein for a right fem-pop  bypass.  At the time of her re-do bypass, she was found to have complete  occlusion of her popliteal and all tibial vessels.  She had individual  thrombectomy of her tibial vessels and had a right fem-to below knee  popliteal bypass with saphenous vein.  She had an excellent quality  saphenous vein for bypass.  She is continued on Coumadin therapy.  She  had had a coagulable workup with no evidence of hypercoagulable state.   Today, fortunately her graft remains patent with a 2+ dorsalis pedis  pulse.  Her wounds are all healing.  She had removal of all of her  surgical staples.  Her surgery was on 08/05.  She does have a small open  area in her right groin and this will be continued to be treated with  dressing changes.  She had a subcutaneous Vicryl suture that was exposed  at this area and this was removed.   I plan to see her again in 1 month.  She will continue her walking  program in the interim.  Fortunately, she has stopped smoking since her  recent admission and was encouraged with this.  She will continue her  Coumadin therapy and we will see her in 1 month with ABI  at that time.   [name], M.D.  Electronically Signed   TFE/MEDQ  D:  01/28/2007  T:  01/31/2007  Job:  [id number]

## 2010-10-21 NOTE — Op Note (Signed)
NAMEDEWANDA, FENNEMA        ACCOUNT NO.:  [id number]   MEDICAL RECORD NO.:  [id number]          PATIENT TYPE:  INP   LOCATION:  2305                         FACILITY:  [facility]   PHYSICIAN:  [name], M.D.    DATE OF BIRTH:  10-17-1960   DATE OF PROCEDURE:  12/08/2006  DATE OF DISCHARGE:                               OPERATIVE REPORT   PREOPERATIVE DIAGNOSIS:  Recurrent occlusion of right fem below-knee  popliteal vein bypass.   POSTOPERATIVE DIAGNOSIS:  Recurrent occlusion of right fem below-knee  popliteal vein bypass.   PROCEDURE:  Exploration of right fem below-knee popliteal vein bypass  followed by placement of new right femoral to below-knee popliteal  Propaten Gore-Tex fem-pop graft.   SURGEON:  [name], M.D.   ASSISTANT:  Gae Bon, P.A. student.   ANESTHESIA:  General endotracheal.   COMPLICATIONS:  None.   CONDITION:  To recovery room stable.   PROCEDURE IN DETAIL:  The patient was taken the operating room and  placed in the supine position where the right groin and right leg were  prepped and draped in sterile fashion.  Incision was made over the prior  femoral incision and carried down through the scar to the level of the  common femoral artery vein anastomosis.  The patient is obese and had  extensive scarring.  The common femoral artery was encircled with vessel  loop.  The old vein graft was encircled as was the superficial femoral  artery and profunda femoris artery.  The bypass graft was opened  transversely and was extremely sclerotic and would not allow for a  dilator passed throughout.  A Fogarty catheter was positioned through  this and was quite scarred down and sclerotic and unusable for bypass.  Separate incision was made through the same prior scar at the below-knee  popliteal medial approach.  The below-knee popliteal artery was quite  small as it had been in prior explorations.  The artery was of minimal  plaque, however.   The popliteal artery was exposed distal to the prior  below-knee popliteal bypass and proximal to the anterior tibial artery  takeoff.  A tunnel was created between the level of the popliteal space  and the groin, and a 6-mm Propaten ring Gore-Tex graft was brought  through the tunnel.  The patient was given 8000 units of intravenous  heparin.  After adequate circulation time, the common superficial  femoral and profundus femoris arteries were occluded.  The old vein  graft was excised, and the graft was spatulated and sewn end-to-side to  the artery with a running 5-0 Prolene suture.  Anastomosis was tested  and found to be adequate.  The graft itself was flushed with heparinized  saline and reoccluded.  The below-knee popliteal artery was occluded at  the old venous anastomosis and also occluded distally.  The artery was  opened with an 11 blade and Potts scissors.  There was poor  backbleeding.  For this reason, three Fogarty catheters were passed  distally and no clot was removed.  The graft was cut to the appropriate  length, spatulated and sewn end-to-side to the  artery with a running 6-0  Prolene suture.  Prior to completion of the anastomosis, the usual  flushing maneuvers were undertaken and anastomosis was completed.  Intraoperative arteriogram was obtained through a 21-gauge butterfly  needle.  This showed small but patent tibial vessel runoff.  The patient was given 50 mg of protamine to reverse heparin.  The wounds  were irrigated with saline.  Hemostasis with electrocautery.  Wounds  were closed with 2-0 Vicryl subcutaneous tissue.  Skin was closed with 3-  0 subcuticular Vicryl.  Sterile dressing was applied.  The patient was  taken to recovery room in stable condition.      [name], M.D.  Electronically Signed     TFE/MEDQ  D:  12/08/2006  T:  12/09/2006  Job:  [id number]

## 2010-10-21 NOTE — Procedures (Signed)
BYPASS GRAFT EVALUATION   INDICATION:  Followup right lower extremity bypass graft.   HISTORY:  Diabetes:  Yes.  Cardiac:  No.  Hypertension:  Yes.  Smoking:  Yes.  Previous Surgery:  Right fem-pop bypass graft with multiple revisions,  most recent on 01/11/2007, right tibial embolectomy/VPA/PTA on  01/11/2007.   SINGLE LEVEL ARTERIAL EXAM                               RIGHT              LEFT  Brachial:                    142                138  Anterior tibial:             64                 82  Posterior tibial:            68                 92  Peroneal:  Ankle/brachial index:        0.48               0.64   PREVIOUS ABI:  Date: 10/03/2007  RIGHT:  0.51  LEFT:  0.71   LOWER EXTREMITY BYPASS GRAFT DUPLEX EXAM:   DUPLEX:  Biphasic to monophasic Doppler waveforms noted throughout the  right lower extremity bypass graft with increased velocity of 121 cm/s  noted at the distal anastomosis level since the distal bypass graft  velocity was 47 cm/s.   IMPRESSION:  1. Patent right fem-pop bypass graft with Doppler velocities      suggestive of a greater than 50% stenosis of the distal      anastomosis, as described above.  2. Stable bilateral ankle brachial indices noted.   ___________________________________________  [name], M.D.   CH/MEDQ  D:  04/02/2008  T:  04/02/2008  Job:  [id number]

## 2010-10-21 NOTE — Assessment & Plan Note (Signed)
OFFICE VISIT   DONNELL, BEAUCHAMP  DOB:  15-Apr-1961                                       03/30/2008  EAVWU#:[id number]   The patient presents today for followup of her extensive  revascularizations in her right leg.  Her most recent surgery was in  August 2008.  She continued to do well and is walking without  claudication symptoms.  She looks quite good today, her wounds are all  well-healed, she is not having any significant swelling in her right  ankle.  Her ankle-arm indices are stable at 0.48 on the right and 0.64  on the left.  Her duplex reveals patency throughout her graft.  She will  continue her usual activities.  We will see her at 15-month intervals for  vascular lab protocol.  She knows to notify us immediately should she  develop any new lower extremity deficits.   [name], M.D.  Electronically Signed   TFE/MEDQ  D:  03/30/2008  T:  04/02/2008  Job:  [id number]   cc:   Ernestina Penna, M.D.

## 2010-10-21 NOTE — Procedures (Signed)
BYPASS GRAFT EVALUATION   INDICATION:  Patient had a sudden lingering pain in the right foot which  occurred 24 hours ago and still hurts.   HISTORY:  Diabetes:  Orally controlled.  Cardiac:  No.  Hypertension:  Yes.  Smoking:  Two packs per day for 30 years.  Previous Surgery:  Redo right femoral-popliteal bypass graft with [facility] by Dr. Arbie Cookey on December 08, 2006.   SINGLE LEVEL ARTERIAL EXAM                               RIGHT              LEFT  Brachial:                    158                167  Anterior tibial:                                47  Posterior tibial:            49                 105  Peroneal:  Ankle/brachial index:        0.29               0.63   PREVIOUS ABI:  Date: December 08, 2006  RIGHT:  0.75  LEFT:  0.60   LOWER EXTREMITY BYPASS GRAFT DUPLEX EXAM:   DUPLEX:  No flow was identified within the right femoral-popliteal  bypass graft.   IMPRESSION:  Occluded right femoral-popliteal bypass graft.   Right ankle brachial index is lower than previously recorded.   Left ankle brachial index is stable from previous study.   ___________________________________________  [name], M.D.   MC/MEDQ  D:  01/07/2007  T:  01/08/2007  Job:  [id number]

## 2010-10-21 NOTE — Procedures (Signed)
LOWER EXTREMITY ARTERIAL EVALUATION-SINGLE LEVEL   INDICATION:  Followup, status post revision to the right SPBG as well as  a tibial embolectomy/VPA/PTA from August, 2008 by Dr. Arbie Cookey.  Patient  states that she is doing well with no new complaints.   HISTORY:  Diabetes:  Yes, orally controlled.  Cardiac:  No.  Hypertension:  Yes.  Smoking:  Quit in July, 2008.  Previous Surgery:  See above.  In July, 2008, redo, right SPBG.  In  April, 2008, revision to right FPBG.  In January, 2008, right FPBG.   RESTING SYSTOLIC PRESSURES: (ABI)                          RIGHT                LEFT  Brachial:               150                  148  Anterior tibial:        88                   78 (0.52)  Posterior tibial:  Peroneal:               100 (0.67)           60  DOPPLER WAVEFORM ANALYSIS:  Anterior tibial:        Monophasic           Monophasic  Posterior tibial:  Peroneal:               Monophasic/near biphasic  Monophasic   PREVIOUS ABI'S:  Date: 01/2007 (preop)  RIGHT:  0.29  LEFT:  0.63   IMPRESSION:  1. Right ankle brachial index improved status post redo      femoropopliteal bypass grafting. Moderate arterial compromise with      monophasic flow.  2. Left ankle brachial indices stable, consistent with moderate-to-      severe arterial compromise.   ___________________________________________  [name], M.D.   AR/MEDQ  D:  03/04/2007  T:  03/05/2007  Job:  [id number]

## 2010-10-21 NOTE — H&P (Signed)
HISTORY AND PHYSICAL EXAMINATION   December 06, 2006   Re:  [name], HORACE              DOB:  February 21, 1961   CHIEF COMPLAINT:  Right foot ischemia.   HISTORY OF PRESENT ILLNESS:  The patient is a 50 year old white female  with recurrent graft occlusion in her right leg.  She has recently  undergone femoral popliteal bypass with saphenous vein in August 2007,  where she had presented with ischemia and gangrenous changes of her  right 4th and 5th toe.  She had been seen and underwent 4th and 5th toe  amputation in November 2007 with eventual healing.  She underwent early  reocclusion of her graft in January and underwent thrombectomy and  exploration of her proximal and distal anastomosis and distal vein patch  angioplasty.  She then had recurrent ischemia arteriogram in April 2008  revealed a critical stenosis in the midportion of her vein graft which  was successfully treated with dilatation.  She was admitted with  reoccluded right fem-pop bypass.   PAST MEDICAL HISTORY:  1. Diabetes.  2. Hypertension.  3. Elevated cholesterol.  4. She has noninsulin diabetes.   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  She is trying to quit  smoking.  She also has difficulty with weight gain.  Current weight is  280 pounds.  She has no other major medical difficulties.   REVIEW OF SYSTEMS:  Otherwise unremarkable.   MEDICATIONS:  1. Nexium 40 mg daily.  2. Zyrtec 10 mg b.i.d.  3. Benicar 20 mg daily.  4. Macrobid 100 mg daily.  5. Flonase p.r.n.  6. Lipitor 20 mg daily.  7. Oxycodone 5/500 p.r.n.   PHYSICAL EXAMINATION:  GENERAL:  Well-developed, well-nourished white  female in no acute distress.  VITAL SIGNS:  Blood pressure is 142/78, pulse 84, respirations 20.  CHEST:  Clear bilaterally.  HEART:  Regular rate and rhythm.  ABDOMEN:  Mildly obese, benign.  EXTREMITIES:  2+ femoral  pulses bilaterally with a well-healed right  femoral to popliteal incision.  She does have a cool right foot as  compared to her left.  She does have motor and sensory intact.   IMPRESSION:  Recurrent occlusion of her right femoral popliteal bypass.   PLAN:  The patient will be admitted on July 2 for exploration and  probable replacement of her graft with prosthetic graft.  She has now  had 3 failures of this graft since it was placed in a month ago.  She  understands the procedure including risk and recurrent occlusive  disease.   [name], M.D.  Electronically Signed   TFE/MEDQ  D:  12/06/2006  T:  12/07/2006  Job:  [id number]

## 2010-10-21 NOTE — Op Note (Signed)
[name], [name]        ACCOUNT NO.:  [id number]   MEDICAL RECORD NO.:  [id number]          PATIENT TYPE:  INP   LOCATION:  3305                         FACILITY:  [facility]   PHYSICIAN:  [name], M.D.    DATE OF BIRTH:  April 04, 1961   DATE OF PROCEDURE:  01/11/2007  DATE OF DISCHARGE:                               OPERATIVE REPORT   PREOPERATIVE DIAGNOSIS:  Right foot ischemia with reocclusion of right  femoral to below-knee popliteal Gore-Tex graft.   POSTOPERATIVE DIAGNOSIS:  Right foot ischemia with reocclusion of right  femoral to below-knee popliteal Gore-Tex graft.   PROCEDURE:  1. Right femoral to below-knee popliteal bypass with saphenous vein      harvest from left leg.  2. Tibial embolectomy.  3. Patch angioplasty of tibioperoneal trunk with saphenous vein patch      and intraoperative arteriogram.   SURGEON:  [name], M.D.   ASSISTANT:  Jerold Coombe, P.A.   ANESTHESIA:  General tracheal.   COMPLICATIONS:  None.   DISPOSITION:  To recovery room stable.   INDICATIONS FOR PROCEDURE:  The patient is a 51 year old with extensive  revascularization history of her right leg.  She had initially presented  one year ago with critical ischemia to her right foot and gangrenous  fourth and fifth toes.  She eventually underwent the fem-pop bypass and  amputation of her fourth and fifth toes.  The bypass was with vein.  She  has early significant stenosis in her vein graft, underwent balloon  angioplasty and then had subsequent occlusion.  She then underwent  attempted thrombectomy followed by placement of a new right femoral to  below-knee popliteal bypass with Propaten Gore-Tex graft.  The patient  suffered early occlusion of this and three days ago underwent  thrombolysis of this in the radiology department.  This showed  technically good bypass with no evidence of inflow stenosis with three-  vessel runoff and no evidence of stenoses at either  anastomosis  following thrombolysis.  The patient had been placed on Coumadin was  starting to be heparinized when she reoccluded her graft yesterday  afternoon.  She was continued on heparin throughout the night and was  taken to the operating room today for redo bypass.  It was recommended  that she undergo harvest of her left saphenous vein with right vein  bypass.  The risks of the procedure and alternatives including  amputation, rethrombolysis were discussed.  It was felt with her  immediate reocclusion after thrombolysis this was not appropriate.  She  clearly did not have adequate flow in her foot for limb salvage long-  term since she had rest pain.  It was recommended she undergo harvested  of her left vein.  Her left ankle arm index was 0.6 and it was discussed  that she could require additional treatment in her left leg in the  future but currently the critical problem was her right leg.  She is  taken to operating room at this time for right fem-pop bypass with left  leg vein.   PROCEDURE IN DETAIL:  The patient taken to operating room  and placed in  supine position where area of both groins and both legs were prepped and  draped in the usual sterile fashion.  Incision made through the prior  groin scar, carried down to isolate the common, superficial femoral and  profundus femoris arteries and the Gore-Tex graft coming off the common  femoral artery.  These were all encircled with vessel loops.  Next,  separate incision was made through the prior below-knee popliteal  incision on medial aspect of her calf and the below-knee popliteal  artery was exposed proximal and distal to the old Gore-Tex graft  anastomosis.  Next, starting in her groin and extending down to midcalf  her saphenous vein was harvested from the left leg.  The vein was  ligated at the saphenofemoral junction with 2-0 silk tie.  The tributary  branches of the left saphenous vein were ligated with 3-0 and 4-0  silk  ties and divided.  The vein harvest was through multiple small skin  incisions leaving skin bridges.  The vein was harvested and was gently  dilated with heparinized saline and was very good caliber.  It did  become smaller at just below the knee but otherwise was excellent for  bypass.  The tunnel was then created between the level of the below-knee  popliteal artery and the right groin.  The patient was given 9000  intravenous heparin.  After adequate circulation time the common  superficial femoral and profundus femoris arteries were occluded.  The  old Gore-Tex graft was removed in its entirety from the common femoral  artery.  The graft was brought through its prior tunnel.  The vein was  brought onto the field, was spatulated and was sewn end-to-side to the  junction of the common femoral artery and superficial femoral artery on  the right. This was done with a running 5-0 Prolene suture.  The  anastomosis tested and found to be adequate.  Next using a Mills  valvulotome, the vein valves were lysed and excellent flow was noted  through the vein graft.  The vein was then brought through a prior  created tunnel down to the level of her below-knee popliteal artery.  The below-knee popliteal artery was occluded proximally and distally  above and below the prior bypass.  The graft was removed from the below-  knee popliteal artery and the Gore-Tex graft was removed in its entirety  with no remaining prosthetic material on the right leg.  There was clot  in the below-knee popliteal artery itself.  The 3 Fogarty catheter was  passed distally and was able to be passed down to the level of the ankle  and clot was removed.  With no further positive passes, the Fogarty was  passed proximally to the level of the above-knee popliteal artery.  There was disease throughout the artery and clot was removed from here  as well.  The vein graft cut to appropriate length, was spatulated and  sewn  end-to-side to the below-knee popliteal artery at the area of the  old Gore-Tex anastomosis.  Prior to completion anastomosis, usual  flushing maneuvers were undertaken.  Anastomosis completed.  There was  occlusive sounding signal at the level of popliteal artery and no flow  in the foot.  Intraoperative arteriogram was taken through a  21 gauge  butterfly needle through the vein graft.  This showed spasm in the  anterior tibial artery but patency of the anterior tibial artery.  There  was no flow in  the popliteal and there was no flow in the peroneal,  posterior tibial arteries.  The patient had a completion study after the  thrombolysis three days earlier showing widely patent trifurcation  proximally.  For this reason, decision was made to explore the  trifurcation.  Incision was extended further distally and the below-knee  popliteal artery was exposed further distally.  The anterior tibial  artery takeoff was encircled with a red vessel loop and the  tibioperoneal trunk was exposed down to the peroneal and posterior  tibial takeoffs and these were individually controlled with vessel loops  as well.  The below-knee popliteal artery was opened at the area of the  anterior tibial takeoff longitudinally.  There was clot present and the  arteriotomy was extended down to the junction of the peroneal and  posterior tibial artery since there was adherent thrombus here.  A 3  Fogarty catheter was passed into the foot via the posterior tibial  artery with thrombus removed and also through the anterior tibial and  again negative pass was taken down the anterior tibial and peroneal.  When no further thrombus was removed, the remaining segment of vein was  used, was opened up longitudinally and was sewn as a patch angioplasty  over the tibioperoneal trunk arteriotomy.  This was done with a running  6-0 Prolene suture.  Prior to completion of the anastomosis, the usual  flush maneuvers were  undertaken.  The anastomosis completed.  Finally  the patient was not given any additional heparin.  The patient did have  a anterior tibial Doppler signal at the ankle.  The wounds were  irrigated with saline.  Hemostasis electrocautery.  The vein harvest  incisions were closed with 3-0 and 4-0 Vicryl ties and skin was closed  skin clips.  The right groin and popliteal incisions were closed with several layers  of 2-0 Vicryl tie and skin was closed with skin clips.  Sterile dressing  was applied.  The patient was taken to recovery room in stable  condition.   OPERATIVE NOTE:  Body levels a curved 95 dictation by      [name], M.D.  Electronically Signed     TFE/MEDQ  D:  01/11/2007  T:  01/12/2007  Job:  [id number]

## 2010-10-24 NOTE — H&P (Signed)
[name], SHAWLEY        ACCOUNT NO.:  [id number]   MEDICAL RECORD NO.:  [id number]          PATIENT TYPE:  INP   LOCATION:  2016                         FACILITY:  [facility]   PHYSICIAN:  Janetta Hora. Fields, MD  DATE OF BIRTH:  12-Jan-1961   DATE OF ADMISSION:  09/24/2006  DATE OF DISCHARGE:                              HISTORY & PHYSICAL   CHIEF COMPLAINT:  Occluded right femoral-popliteal bypass grafting.   HISTORY OF PRESENT ILLNESS:  The patient is a pleasant 50 year old  Caucasian female who is well known to vascular service.  The patient  underwent right femoral-popliteal below knee bypass grafting with  saphenous vein in August of 2007.  Patient tolerated the procedure well  and was discharged home.  She unfortunately returned in January 2008  with occluded right femoral popliteal bypass.  She was then taken to the  operating room where she underwent thrombectomy of right femoral-  popliteal bypass with vein patch angioplasty, distal anastomosis.  The  patient tolerated that procedure well.  Noted, she also underwent right  fourth and fifth toe amputation in November of 2007.  Since then, the  patient has been followed by the office.  She states, over the past  several months, she has noted pain at the right toe amputation site.  Feels like it is infected.  States area is getting worse.  One suture  noted through skin, and patient cut that herself.  States that this was  not getting better.  She denies any claudication symptoms, ulcerations,  purulent drainage, fevers, chills, tenderness, numbness, tingling,  decreased temperature or motor function.  The patient was seen and  evaluated at the office today.  She was at the office for follow-up  appointment.  While there, noted occluded right femoral-popliteal bypass  graft.   PAST MEDICAL HISTORY:  1. Peripheral vascular occlusive disease.  2. Diabetes mellitus type 2.  3. Hypertension.  4. Tobacco abuse.  5. History  of heart murmur as a child.  6. History of renal cyst.  7. History of recurring urinary tract infections, on prophylactic      Macrobid.  8. History of gastroesophageal reflux disease.  9. History of pulmonary nodule.   PAST SURGICAL HISTORY:  1. Status post right femoral to below-knee popliteal bypass graft      using known reverse saphenous vein graft January 22, 2006.  2. Status post right toe amputation, 4th and 5th, April 16, 2006.  3. Status post thrombectomy of right femoral to popliteal bypass graft      with exploration of proximal and distal anastomosis with vein patch      angioplasty of distal site.  4. Status post C-section.  5. Status post lumbar spine surgery.   ALLERGIES:  HE HAS NO KNOWN DRUG ALLERGIES.  THE PATIENT DOES STATE SHE  IS SENSITIVE TO CORTISONE, STATES INTERNAL SWELLING.   MEDICATIONS:  1. Nexium 40 mg daily.  2. Zyrtec 10 mg b.i.d.  3. Benicar 20 mg daily.  4. Macrobid 100 mg daily.  5. Flonase p.r.n..  6. Lipitor 20 mg daily.  7. Oxycodone 5/500 p.r.n.   SOCIAL HISTORY:  The  patient is married with one child, lives at home  with family.  The patient states she still smokes, was down to 5  cigarettes per day but has increased since toe amputation site pain.  The patient is still working as a Runner, broadcasting/film/video.   FAMILY HISTORY:  Noncontributory.   REVIEW OF SYSTEMS:  See HPI for pertinent positives and negatives.  The  patient denies any recent fevers, night sweats, chills.  She denies any  recent changes in vision, difficulty swallowing or changes in hearing.  Denies any shortness of breath, coughing, hemoptysis or wheezing.  She  denies any chest pain, palpitations, lightheadedness, dizziness,  orthopnea, proximal nocturnal dyspnea.  Denies any nausea, vomiting,  abdominal pain, changes in bowel movements, diarrhea, constipation,  melena, hematemesis, hematochezia.  She also has a history of chronic  urinary tract infections.  Denies any TIA or CVA  symptoms such as muscle  weakness, slurred speech, amaurosis fugax, dysphagia.   PHYSICAL EXAM:  GENERAL:  Well-developed, well-nourished white female in  no acute distress.  VITAL SIGNS:  Not available.  HEENT:  Normocephalic, atraumatic.  Pupils equal, round, react to light  accommodation.  Extraocular movements intact.  Oral mucosa is pink and  moist.  NECK:  Supple.  RESPIRATORY:  Clear to auscultation bilaterally.  CARDIAC:  Regular rate and rhythm.  No murmurs, gallops, rubs noted.  ABDOMEN:  Bowel sounds x4.  Soft, nontender on palpitation.  GENITOURINARY:  Deferred.  RECTAL:  Deferred.  EXTREMITIES:  Noted skin breakdown, right 4th and 5th toe amputation  site.  No erythema.  Purulent drainage noted.  No ulceration noted.  The  patient does have cold bilateral lower extremities to touch.  She has 2+  bilateral radial pulses noted.  No palpable right dorsalis pedis or  posterior tibial pulses noted.  Dorsalis pedis and posterior pulses 1+  noted on the left.  She has a well-healed scar on the right femur from  femoral to popliteal bypass grafting.  NEUROLOGIC:  Cranial nerves II-XII intact.  The patient is alert and  oriented x4.  Gait steady.  MUSCULOSKELETAL:  Strength 5/5 upper and lower extremities bilaterally.      [name], PA      Janetta Hora. Fields, MD  Electronically Signed   KMD/MEDQ  D:  09/24/2006  T:  09/25/2006  Job:  [id number]

## 2010-10-24 NOTE — H&P (Signed)
[name], [name]        ACCOUNT NO.:  [id number]   MEDICAL RECORD NO.:  [id number]          PATIENT TYPE:  OIB   LOCATION:  5501                         FACILITY:  [facility]   PHYSICIAN:  [name], M.D.    DATE OF BIRTH:  Oct 24, 1960   DATE OF ADMISSION:  06/29/2006  DATE OF DISCHARGE:                              HISTORY & PHYSICAL   CHIEF COMPLAINT:  Left leg/foot numbness.   HISTORY OF PRESENT ILLNESS:  The patient is a 50 year old female with  known history of peripheral vascular occlusive disease status post right  fem to below knee popliteal bypass with greater saphenous vein done on  January 22, 2006 by Dr. Arbie Cookey.  The patient also had ischemic toes and  subsequently underwent a right foot fourth and fifth toe amputation.  On  Thursday, this week, she developed increasing symptoms of cramping in  her right leg, as well as numbness to the foot.  She was subsequently  seen by Dr. Arbie Cookey, who recommended proceeding with arteriography and  this was done on June 29, 2006 by Dr. Darrick Penna.  Reportedly, it shows  that the bypass has occluded.  She will require redo revascularization  for relief of symptoms.  Currently, the patient primarily has symptoms  of right foot numbness.  The claudication appears to have resolved, but  she is having much less activity.  She denies any tissue loss or  evidence of infection.  She has no fevers or chills.  The foot is not  tender, but it is numb.  There is some tingling type sensation.  She  also noticed that the foot is cool to the touch.  She reports normal  motor function.  She denies chest pain, shortness of breath currently.  She does occasionally have these symptoms and is a known ongoing tobacco  abuser.  She has no symptoms of TIA or stroke.  She is admitted for redo  femoral popliteal bypass.   PAST MEDICAL HISTORY:  1. Peripheral vascular occlusive disease.  2. Noninsulin dependent diabetes.  3. Hypertension.  4. Tobacco  abuse.  5. History of childhood heart murmur.  6. History of renal cyst.  7. History of recurrent urinary tract infections on chronic      prophylaxis with Macrobid.  8. History of gastroesophageal reflux disease.  9. History of obesity.  10.History of pulmonary nodules.   PAST SURGICAL HISTORY:  Includes:  1. C-section.  2. L-spine surgery.  3. Right femoral popliteal bypass.  4. Right fourth and fifth toe amputation.   ALLERGIES:  SHE HAS NO KNOWN DRUG ALLERGIES, BUT DOES HAVE A REACTION TO  CORTISONE IN THE PAST, WHICH HAS CAUSED INTERNAL SWELLING.   CURRENT MEDICATIONS:  1. Zyrtec 10 mg b.i.d. p.r.n.  2. Benicar 20 mg daily.  3. Macrobid 100 mg daily.  4. Flonase p.r.n.  5. Oxycodone 5/500 p.r.n.  6. Lipitor 20 mg daily.   REVIEW OF SYSTEMS:  See the history of present illness for pertinent  positives and negatives.  Otherwise, is remarkable for recent weight  gain over the holidays.  Otherwise, essentially unremarkable from  previous state.   SOCIAL  HISTORY:  She is married with 1 child.  She continues to smoke  cigarettes and has for approximately the past 25 years.  Alcohol use is  occasional.  Occupation:  She works as a Runner, broadcasting/film/video.   FAMILY HISTORY:  Remarkable for her father and brother having premature  cardiac and vascular disease.   PHYSICAL EXAMINATION:  VITAL SIGNS:  Blood pressure 119/69.  Heart rate  79.  Respirations 16.  Temperature 96.9.  GENERAL APPEARANCE:  [age]-year-old white, obese female in no  acute distress.  HEENT EXAMINATION:  Normocephalic, atraumatic.  Pupils equal, round and  reactive to light.  Extraocular movements intact.  Oral mucosa is pink  and moist.  There are no obvious lesions.  Sclerae is anicteric.  Pharynx is clean, no exudates, erythema or lesions.  NECK EXAMINATION:  Supple.  No jugular venous distention.  No bruits.  No  lymphadenopathy.  PULMONARY EXAMINATION:  Symmetrical and inspiration unlabored.  Clear  breath  sounds without wheezes, rhonchi or crackles.  CARDIAC EXAMINATION:  Regular rate and rhythm without murmurs, gallops  or rubs.  ABDOMINAL EXAMINATION:  Soft, nontender, nondistended.  Obese.  Normoactive bowel sounds.  No masses.  GENITOURINARY/RECTAL:  Deferred.  EXTREMITY:  No edema.  No varicosities.  No venous stasis changes.  The  right foot is slightly cool compared to the left.  Peripheral pulses are  absent below the level of the femoral bilateral.  NEUROLOGICAL:  Grossly nonfocal with the exception of numbness to the  right foot grossly.  Gait is not tested as she is status post her  arteriogram and at bed rest.  Muscle strength is grossly equal and  intact bilaterally.  She evidences no significant asymmetry regarding  her neurological exam.   ASSESSMENT:  Recurrent right leg ischemia secondary to peripheral  vascular occlusive disease with occluded right femoral popliteal bypass.   PLAN:  Redo right fem/pop bypass on June 30, 2006 per Dr. Arbie Cookey.      Rowe Clack, P.A.-C.      [name], M.D.  Electronically Signed    WEG/MEDQ  D:  06/29/2006  T:  06/29/2006  Job:  [id number]

## 2010-10-24 NOTE — Op Note (Signed)
[name], [name] NO.:  [id number]   MEDICAL RECORD NO.:  [id number]          PATIENT TYPE:  INP   LOCATION:  3313                         FACILITY:  [facility]   PHYSICIAN:  [name], M.D.    DATE OF BIRTH:  1961-01-02   DATE OF PROCEDURE:  01/22/2006  DATE OF DISCHARGE:                                 OPERATIVE REPORT   SURGEON:  [name], M.D.   ASSISTANT:  Stephanie Acre Dominick, PA-C.   PREOPERATIVE DIAGNOSIS:  Right foot ischemia with atheroma of the right  fourth and fifth toes.   POSTOPERATIVE DIAGNOSIS:  Right foot ischemia with atheroma of the right  fourth and fifth toes.   PROCEDURE:  Right femoral-to-below-knee-popliteal bypass with translocated  nonreversed saphenous vein.   ANESTHESIA:  General endotracheal.   COMPLICATIONS:  None.   DISPOSITION:  To recovery room, stable.   PROCEDURE IN DETAIL:  The patient was taken to the operating room and placed  supine, at which time the areas of the right foot and right leg and groin  were prepped and draped in the usual sterile fashion.  Incision was made  oblique above the inguinal crease and carried down nicely to common,  superficial femoral and profunda femoris arteries.  The common femoral vein  and saphenofemoral junction were identified.  Saphenous vein was unroofed  from the level of the groin to the below-knee position, leaving multiple  skin bridges.  The vein was of good caliber throughout its course.  The  below-knee popliteal artery was exposed through the medial vein-harvest  incision and was also of good caliber, with minimal atherosclerotic change  in the popliteal artery.  The vein was harvested from the midcalf up to the  level of the groin, ligating tributary branches with 3-0 and 4-0 silk ties  and dividing them.  The saphenofemoral junction was occluded with a Cooley  clamp.  The vein was excised from the saphenofemoral junction and the  saphenofemoral junction was  oversewn with a running 5-0 Prolene suture.  A  tunnel was created from the level of the below-knee popliteal artery to the  femoral artery in the groin.  The patient was given 9000 units intravenous  heparin.  After adequate circulation time, the common, superficial femoral  and profunda femoris arteries were occluded.  The common femoral artery was  opened with an 11 blade and extended longitudinally with Potts scissors.  The vein was brought onto the field.  The proximal portion was spatulated  and sewn end to side to the artery with a running 6-0 Prolene suture.  The  anastomosis was tested and found to be adequate.  Next, the vein valves were  lysed with the Mills valvulotome and excellent flow was noted through the  vein graft.  The vein graft was brought through the prior created tunnel to  the level of the below-knee popliteal artery.  A Webril and pneumatic  tourniquet were placed in the above-knee position.  The leg was elevated and  exsanguinated with an Esmarch tourniquet.  The pneumatic tourniquet was  inflated to 250 mmHg.  The below-knee popliteal artery was  opened with an 11  blade and extended longitudinally with Potts scissors.  The vein was cut to  appropriate length.  It was spatulated and sewn end to side to the artery  with a running 6-0 Prolene suture.  A 3 dilator passed easily through the  proximal and distal anastomosis.  The vein was controlled with a vascular  clamp and the tourniquet was deflated.  After the usual flushing maneuvers,  the anastomosis was completed.  Graft-dependent Doppler flow was noted in  the foot.  The wounds were irrigated with saline.  The patient was given 15  mg protamine.  Hemostasis was obtained with electrocautery.  The wounds were  closed with 3-0 Vicryl in several layers, and the harvest site, the groin  and below-knee popliteal incision were closed with several layers of 2-0  Vicryl, followed by closure of the skin with a 4-0  subcuticular Vicryl  stitch.  A sterile dressing was applied, and the patient was taken to the  recovery room in stable condition.      [name], M.D.  Electronically Signed     TFE/MEDQ  D:  01/22/2006  T:  01/22/2006  Job:  [id number]

## 2010-10-24 NOTE — Op Note (Signed)
NAME:  [name], [name]        ACCOUNT NO.:  [id number]   MEDICAL RECORD NO.:  [id number]           PATIENT TYPE:   LOCATION:                                 FACILITY:   PHYSICIAN:  [name], M.D.         DATE OF BIRTH:   DATE OF PROCEDURE:  06/30/2006  DATE OF DISCHARGE:                               OPERATIVE REPORT   PREOPERATIVE DIAGNOSIS:  Right foot ischemia with occluded right femoral-  to-popliteal bypass graft.   POSTOPERATIVE DIAGNOSIS:  Right foot ischemia with occluded right  femoral-to-popliteal bypass graft.   PROCEDURES:  1. Thrombectomy of right femoral-to-popliteal bypass, with exploration      of proximal and distal anastomosis.  2. Vein patch angioplasty of distal anastomosis.  3. Intraoperative arteriogram.   SURGEON:  [name], MD.   ASSISTANT:  Constance Holster, PA-C.   ANESTHESIA:  General endotracheal.   COMPLICATIONS:  None.   DISPOSITION:  To recovery room, stable.   INDICATIONS FOR PROCEDURES:  The patient is a 50 year old white female,  who is status post right femoral-to-popliteal bypass in August 2007.  At  that time, she had presented with gangrenous changes of her fourth and  fifth toes, underwent fem-pop bypass, and eventual amputation and  healing of her right fourth and fifth toes.  She had been seen most  recently in our office in December 2007, with a normal ankle-arm index  and normal graft flow by duplex.  She presented on January 21 with a 4-  day history of ischemic pain in her right foot.  She was found by  Doppler to have an occluded graft.  She underwent repeat arteriogram the  day prior to this procedure, and this revealed an occluded fem-pop, and  also progressive disease in her popliteal artery and tibial runoff.  It  was recommended that she undergo surgery with attempted thrombectomy of  her bypass, with potential placement of a prosthetic graft if this was  not possible.   PROCEDURES IN DETAIL:  The  patient was taken to the room and placed  supine.  The areas of the right groin and right leg were prepped and  draped in the usual sterile fashion.  An incision was made over the  prior groin incision, carried to isolate the common femoral artery and  the venous anastomosis.  The vein was opened transversely near the  arterial anastomosis, and the vein at this level appeared to be normal.  The Fogarty would pass down to the distal anastomosis, but not through  the distal anastomosis.  The vein was reoccluded.  Next, the below-knee  popliteal anastomosis was exposed through the same medial incision, and  carried down to isolate the popliteal artery and the distal vein graft  anastomosis.  The artery was of a small caliber.  The patient was given  8000 units of intravenous heparin, and after adequate circulation time,  the vein was opened across the hood of the graft.  This did reveal a  tight stenosis with intimal hyperplasia at the distal anastomosis.  The  vein above this was thrombectomized, and a  3.5 dilator passed with  slight resistance through this.  The vein did not appear sclerotic.  There was no clot noted in the distal popliteal artery, and a 3 dilator  passed through this without resistance.  The transverse incision near  the arterial anastomosis at the proximal anastomosis was closed with  interrupted 6-0 Prolene sutures, and the clamps were removed from the  vein graft, restoring excellent flow through the graft.  For this, the  decision was made to try to salvage the vein graft, rather than place a  prosthetic graft at this time.  Using ultrasound visualization, the  distal saphenous vein was patent from the level of the ankle to the  distal calf.  A separate incision was made and this vein was harvested  and the tributary branches were ligated with 3-0 and 4-0 silk ties.  The  vein was ligated proximally and distally and was harvested.  The vein  was opened longitudinally  and was used as a patch angioplasty across the  prior anastomosis, extending down onto the native below-knee popliteal  artery and up onto the prior vein graft.  This was accomplished with a  running 6-0 Prolene suture.  Prior to completion of the anastomosis, the  3 dilator again passed through the distal anastomosis.  The anastomosis  was completed after the usual flushing maneuvers.  The patient had a  graft-dependent Doppler signal at the level of the foot.  Intraoperative  arteriogram was obtained with 21-gauge butterfly needles through the  proximal vein.  This did show a somewhat small vein graft and somewhat  small popliteal artery and a patulous patched anastomosis with no flow-  limiting stenosis.  The patient's heparin was not reversed.  The  patient's wounds were irrigated with saline and hemostasis obtained with  electrocautery.  The wounds were closed with 2-0 Vicryl in the  subcutaneous tissue in several layers in the groin and popliteal  incision.  The distal ankle incision for vein patch harvest was closed  with a 3-0 and 4-0 subcuticular stitch.  The skin at the 2 groin and  popliteal incisions was closed with a 3-0 Vicryl suture in subcuticular  fashion.  A sterile dressing was applied and the patient was taken to  the recovery room in stable condition.      [name], M.D.  Electronically Signed     TFE/MEDQ  D:  06/30/2006  T:  06/30/2006  Job:  [id number]

## 2010-10-24 NOTE — Op Note (Signed)
[name], [name]        ACCOUNT NO.:  [id number]   MEDICAL RECORD NO.:  [id number]          PATIENT TYPE:  AMB   LOCATION:  SDS                          FACILITY:  [facility]   PHYSICIAN:  [name], M.D.    DATE OF BIRTH:  12-01-1960   DATE OF PROCEDURE:  01/20/2006  DATE OF DISCHARGE:                                 OPERATIVE REPORT   PREOPERATIVE DIAGNOSIS:  Severe bilateral lower extremity arterial  insufficiency with emboli to right foot.   POSTOPERATIVE DIAGNOSIS:  Severe bilateral lower extremity arterial  insufficiency with emboli to right foot.   PROCEDURE:  Aortogram and bilateral lower extremity runoff.   SURGEON:  [name], M.D.   ANESTHESIA:  1% lidocaine, local.   COMPLICATIONS:  None.   DISPOSITION:  To holding area stable.   PROCEDURE IN DETAIL:  The patient was taken to the cath lab, placed in  supine position with the area of both groins prepped and draped in usual  sterile fashion.  Using local anesthesia in a single wall puncture, the  right common femoral artery was entered and the guide wire was passed up to  the level of suprarenal aorta.  A 5 French sheath was passed over the guide  wire and a pigtail catheter was positioned at the level of suprarenal aorta.  AP projection was undertaken, revealing single renal arteries bilaterally  with wide patency.  There was no evidence of irregularity or flow-limiting  stenosis in the aorta, common or external iliac arteries.  Next, the pigtail  catheter was exchanged for an IMA catheter and the left common iliac artery  was electively catheterized.  The patient had irregularity in the common  iliac artery at the junction of the takeoff of the profunda femoris artery.  The patient had a large profunda femoris artery.  The superficial femoral  artery was occluded at its takeoff.  There was reconstitution of above-knee  popliteal artery and runoff on the left was a 3-vessel runoff.  The  posterior  tibial artery with a dominant runoff as well into the left foot.   Next, the IMA catheter was removed and right leg runoff was obtained via the  right femoral sheath.  This showed wide patency of the common femoral  artery, the profundus, and the proximal superficial femoral artery. There  was occlusion of the superficial femoral artery at the adductor canal.  There was irregularity of the popliteal artery behind the knee, but below-  knee popliteal artery was normal and widely patent.  There was 3-vessel  runoff on the right with the posterior tibial being the dominant runoff  vessel.  Additional subtracted reviews were obtained at the knee, again  showing irregularity at the at-knee position.  A right lateral foot view was  obtained showing good filling into the foot from the anterior tibial and  posterior tibial arteries.   The patient tolerated the procedure without immediate complication and was  transferred to the holding area in stable condition.      [name], M.D.  Electronically Signed     TFE/MEDQ  D:  01/20/2006  T:  01/20/2006  Job:  [id number]

## 2010-10-24 NOTE — Op Note (Signed)
[name], [name]        ACCOUNT NO.:  [id number]   MEDICAL RECORD NO.:  [id number]          PATIENT TYPE:  AMB   LOCATION:  SDS                          FACILITY:  [facility]   PHYSICIAN:  [name], M.D.    DATE OF BIRTH:  01-Aug-1960   DATE OF PROCEDURE:  04/16/2006  DATE OF DISCHARGE:  04/16/2006                                 OPERATIVE REPORT   PREOPERATIVE DIAGNOSIS:  Gangrene, right fourth and fifth toe.   POSTOPERATIVE DIAGNOSIS:  Gangrene, right fourth and fifth toe.   PROCEDURE:  Toe amputation, right fourth and fifth toe, primary closure.   SURGEON:  [name], M.D.   ASSISTANT:  Nurse.   ANESTHESIA:  General endotracheal.   COMPLICATIONS:  None.   DISPOSITION:  To recovery room stable.   PROCEDURE IN DETAIL:  The patient was taken the operating room and placed in  a supine position, where the area of the right foot was prepped and draped  in the usual sterile fashion.  A fishmouth incision was made at the base of  the fourth and fifth toe, carried down to the phalangeal bone.  The bone was  resected in line with the incision and was resected further proximally with  a rongeur.  The metatarsal head was left intact bilaterally.  There was good  bleeding and hemostasis was obtained with electrocautery.  The wounds were  irrigated with saline and were closed with 3-0 nylon mattress sutures.  A  sterile dressing was applied.  The patient was taken to the recovery room in  stable condition.      [name], M.D.  Electronically Signed     TFE/MEDQ  D:  04/16/2006  T:  04/17/2006  Job:  [id number]

## 2010-10-24 NOTE — H&P (Signed)
NAME:  [name], [name]        ACCOUNT NO.:  [id number]   MEDICAL RECORD NO.:  [id number]          PATIENT TYPE:  INP   LOCATION:  NA                           FACILITY:  [facility]   PHYSICIAN:  [name], M.D.    DATE OF BIRTH:  1960/11/13   DATE OF ADMISSION:  01/22/2006  DATE OF DISCHARGE:                                HISTORY & PHYSICAL   CHIEF COMPLAINT:  Right fourth and fifth toe cyanosis.   HISTORY OF PRESENT ILLNESS:  This patient is a 50 year old female referred  to Dr. Arbie Cookey with a history of approximately 2 weeks of increasing cyanosis  and duskiness to the right fourth and fifth toes.  The patient has a  significant history for claudication of the bilateral lower extremities but  has had no previous tissue component.  The cyanosis is associated with some  increased pain and claudication.  Claudication is primarily in her calves  but occasionally does go to the thigh level.  She has recently been  diagnosed with noninsulin-dependent diabetes.  She does additionally  describe some symptoms of peripheral neuropathy with a tingling-type  sensation in her feet.  She was scheduled and underwent peripheral  arteriography by Dr. Arbie Cookey on January 20, 2006.  He has reviewed the study.  The results are currently available to the chart.  He has recommended  proceeding with right femoral-popliteal bypass.  She will be admitted  electively on January 22, 2006, for the procedure.   PAST MEDICAL HISTORY:  1. Peripheral vascular occlusive disease.  2. Noninsulin-dependent diabetes mellitus.  3. Hypertension.  4. Tobacco abuse.  5. History of a heart murmur as a child.  6. Gastroesophageal reflux.  7. History of a renal cyst.  8. History of frequent urinary tract infections.   ALLERGIES:  She has no known drug allergies although has had some type of  reaction related to cortisone in the past.  She describes these symptoms as  internal swelling.   MEDICATIONS CURRENTLY:  1.  Zyrtec one p.o. b.i.d.  2. Benicar 20 mg daily.  3. Hydrocodone/APAP p.r.n.  4. __________ 4/4 mg one daily.  She has been told that she may reduce      this dose in the near future.  5. Stool softener p.r.n.  6. Fluticasone inhalation once daily.  7. Lyrica 75 mg b.i.d.   REVIEW OF SYSTEMS:  Positive for shortness of breath, primarily dyspnea on  exertion.  She also describes dry skin.  She also describes the tingling in  both lower extremities, primarily in the feet.  Otherwise, review is  unremarkable.   PAST SURGICAL HISTORY:  She had a C-section in 1989.  She has had L-spine  surgery in 1991.   SOCIAL HISTORY:  Remarkable for cigarette use, one-half pack per day for  approximately 25 years.  Alcohol use:  None.   FAMILY HISTORY:  Remarkable for early vascular disease with her father and  her brother having coronary artery bypass grafting in their late 46s or  early 23s.   PHYSICAL EXAMINATION:  VITAL SIGNS:  Blood pressure 158/95, heart rate 48,  respirations 16, temperature 97.4.  GENERAL:  This is an adult obese female in no acute distress.  NECK:  Supple, no jugular venous distention, no bruits, no adenopathy, no  thyromegaly.  HEENT:  Pupils equal, round, and reactive to light.  Extraocular movements  intact.  Pharynx is clear without exudates or erythema.  PULMONARY:  Lungs are clear to auscultation.  She is noted to be at bed rest  following her arteriogram, so a full examination was not undertaken.  CARDIAC:  Regular rate and rhythm, S1, S2, without murmurs, gallops or rubs.  PERIPHERAL PULSES:  Absent below the level of the femoral arteries  bilaterally.  SKIN:  Right fourth and fifth toes with cyanotic changes.  EXTREMITIES:  No clubbing, cyanosis, or edema.  NEUROLOGIC:  Grossly notable for decreased sensation in the feet.  Otherwise, it is essentially nonfocal.  ABDOMEN:  Obese, no bruits, no hepatosplenomegaly.  No masses.   ASSESSMENT:  Severe peripheral  vascular occlusive disease with cyanotic  right fourth and fifth toes.   PLAN:  Right femoral-popliteal bypass grafting.      Rowe Clack, P.A.-C.      [name], M.D.  Electronically Signed    WEG/MEDQ  D:  01/20/2006  T:  01/20/2006  Job:  [id number]   cc:   Ernestina Penna, M.D.  Vanita Panda. Magnus Ivan, M.D.

## 2010-10-24 NOTE — Op Note (Signed)
NAME:  [name], [name]        ACCOUNT NO.:  [id number]   MEDICAL RECORD NO.:  [id number]          PATIENT TYPE:  OIB   LOCATION:  2807                         FACILITY:  [facility]   PHYSICIAN:  Janetta Hora. Fields, MD  DATE OF BIRTH:  Jun 19, 1960   DATE OF PROCEDURE:  06/29/2006  DATE OF DISCHARGE:                               OPERATIVE REPORT   PREOPERATIVE DIAGNOSIS:  Rest pain right foot.   POSTOPERATIVE DIAGNOSIS:  Rest pain right foot.   PROCEDURE:  Right lower extremity arteriogram.   ANESTHESIA:  Local.   INDICATIONS:  The patient is a 81 old female who has previously  undergone right femoral to below-knee popliteal bypass by Dr. Colin Benton  approximately 6 months ago.  She has had a few day history of numbness,  tingling and pain in her right foot.   OPERATIVE DETAIL:  After obtaining informed consent, the patient taken  to the PV lab.  The patient placed supine position on the angio table.  The patient's right groin was prepped and draped usual sterile fashion.  Local anesthesia was infiltrated over the right common femoral artery.  The patient was quite obese and the pannus was retracted superiorly.  Local anesthesia was then infiltrated over the right common femoral  artery using fluoroscopic guidance to determine the precise location of  the femoral head and right common femoral artery.  The right common  femoral artery was successfully cannulated and a 0.035 J-tipped Rosen  wire was threaded into the abdominal aorta under fluoroscopic guidance.  The needle was removed.  An attempt was made to place a 5-French sheath  over the wire.  However, due to the depth of the patient's femoral  artery and previous scar tissue the dilator would not advance easily.  Therefore, this was pulled back and a 5-French end-hole catheter was  placed over the guidewire and up into the abdominal aorta.  The Rosen  wire was then exchanged for a .035 Amplatz wire.  This was then threaded  in  the abdominal aorta and the end-hole catheter removed over the  guidewire.  The 5-French sheath then passed easily over the Amplatz wire  into the right common femoral artery.  The guidewire was then removed.  The flush was noted to flush and draw easily.  This was thoroughly  flushed with heparinized saline.  Next contrast arteriogram was obtained  of the right lower extremity and multiple subtraction views and also  some peak hole views of the right calf and foot.  This shows a widely  patent right common iliac and external iliac artery.  There is some mild  atherosclerotic change of the right common femoral artery.  The profunda  femoris and superficial femoral arteries are patent proximally.  The  proximal portion of the vein graft was not visualized and is occluded.  There are scattered collaterals throughout the thigh but there is a  paucity of these.  There are some geniculate collaterals which fill the  right popliteal artery.  There is mild atherosclerotic change of the  right popliteal artery.  Anterior tibial artery fills proximally but  occludes in the mid leg.  The peroneal artery is very small and occludes  in the mid leg.  Posterior tibial artery is also very small but is in  continuity all the way to the level of the ankle and the only vessel  filling the foot.  The plantar arch does not fill.  On very delayed  films there was some retrograde filling of the right dorsalis pedis  artery.   Next the patient was taken to the holding area with the 5-French sheath  in place to be removed in the holding area.  The patient tolerated  procedure well and there were no complications.   OPERATIVE FINDINGS:  1. Occlusion of right femoral to below-knee popliteal bypass graft.  2. Right superficial femoral peroneal and anterior tibial artery      occlusions with severe tibial and superficial femoral artery      occlusive disease.      Janetta Hora. Fields, MD  Electronically  Signed     CEF/MEDQ  D:  06/29/2006  T:  06/29/2006  Job:  [id number]

## 2010-10-24 NOTE — Discharge Summary (Signed)
NAMECHANTELLA, [name]        ACCOUNT NO.:  [id number]   MEDICAL RECORD NO.:  [id number]          PATIENT TYPE:  INP   LOCATION:  2041                         FACILITY:  [facility]   PHYSICIAN:  [name], M.D.    DATE OF BIRTH:  11-Jul-1960   DATE OF ADMISSION:  01/22/2006  DATE OF DISCHARGE:  01/26/2006                                 DISCHARGE SUMMARY   ADMISSION DIAGNOSIS:  Right foot ischemia with atheroma of the right fourth  and fifth toes.   DISCHARGE DIAGNOSES:  1. Right foot ischemia with atheroma of the right fourth and fifth toes,      status post a right STBG.  2. Peripheral vascular disease.  3. Noninsulin dependent diabetes mellitus.  4. Hypertension.  5. Tobacco abuse.  6. History of heart murmur as a child.  7. Gastroesophageal reflux disease.  8. History of renal cyst.  9. History of frequent urinary tract infections.   CONSULTS:  None.   PROCEDURES:  On January 22, 2006 the patient underwent right femoral to below  knee popliteal bypass graft with translocated nonreversed saphenous vein by  Dr. Gretta Began.   HISTORY AND PHYSICAL:  The patient is a 50 year old female referred to Dr.  Arbie Cookey with a history of approximately 2 weeks of increasing cyanosis and  duskiness of the right fourth and fifth toes.  The patient had a significant  history for claudication of the bilateral lower extremities, but had no  previous tissue component.  Cyanosis with associated increased pain and  claudication.  The claudication is primarily in her calves, though  occasionally does goes up to the thigh level.  The patient has recently been  diagnosed with noninsulin dependent diabetes mellitus.  She does  additionally describe some symptoms of peripheral neuropathy with a tingling  type sensation in her feet.  The patient was scheduled and underwent  peripheral arteriography by Dr. Juel Burrow on January 20, 2006.  He has reviewed the  study.  The results are currently available in  the chart.  It was  recommended the patient undergo a right femoral popliteal bypass, the  patient will be admitted electively on January 22, 2006 for the procedure.   HOSPITAL COURSE:  Postoperatively, the patient had progressed well than  expected.  Her hospital stay has been uneventful.  The patient was  transferred to 2000 appropriately on postop day number 1.  She did have some  acute blood loss anemia; however, she did not require any blood transfusions  and her hemoglobin and hematocrit have raised appropriately.  The patient's  vital signs have remained stable.  She has remained afebrile.  The patient's  graft has been patent with a 2+ dorsalis pedis pulse.   The patient has been ambulating well.  She is ambulating 18 feet with a  rolling walker on postop day number 1.  The patient is able to ambulate in  the hallway with a walker independently on postop day number 3.  Tobacco  cessation is spoke with the patient on January 25, 2006, the patient agrees  to discontinue smoking habits.  The patient's blood sugars have been within  normal limits.  She has been maintained on a sliding scale of insulin.  Patient was started on Lovenox 40 mg subcu daily on January 23, 2006 for DVT  prophylaxis.   The patient will be discharged home in the morning provided she remains  stable and progresses in a positive manner.   DISCHARGE DISPOSITION:  The patient will be discharged to home without home  health.   INSTRUCTIONS:  1. The patient is instructed to follow a low-fat, low-salt diabetic diet.  2. Do no driving for 2 weeks and no heavy lifting greater than 10 pounds      for 3 weeks.  3. The patient is ambulate 3 to 4 times daily, increase activity as      tolerated.  4. The patient is to continue her breathing exercises.  She may shower and      clean the wounds with mild soap and water.  5. The patient was instructed to call the office with any problems, such      as incision drainage,  erythema, temp greater than 101.5.  6. The patient was counseled on smoking cessation.   MEDICATIONS:  Include:  1. Oxycodone 5 mg one to two tabs every 4 p.r.n.  2. Zyrtec 10 mg p.o. b.i.d.  3. Benicar 20 mg p.o. daily.  4. Stool softener p.r.n.  5. Flovent inhaler 1 puff p.o. daily.  6. Lyrica 75 mg p.o. b.i.d.  7. Avandaryl 4/4 mg p.o. daily.   FOLLOWUP:  The patient will have a followup appointment with Dr. Arbie Cookey on  February 22, 2006 at 2:30 p.m.      Constance Holster, PA      [name], M.D.  Electronically Signed    JMW/MEDQ  D:  01/25/2006  T:  01/25/2006  Job:  [id number]

## 2011-03-23 LAB — CBC
HCT: 31.7 — ABNORMAL LOW
HCT: 32.8 — ABNORMAL LOW
HCT: 33.1 — ABNORMAL LOW
HCT: 33.2 — ABNORMAL LOW
HCT: 33.3 — ABNORMAL LOW
HCT: 33.5 — ABNORMAL LOW
HCT: 34.6 — ABNORMAL LOW
HCT: 35.8 — ABNORMAL LOW
HCT: 38.1
HCT: 38.8
Hemoglobin: 10.4 — ABNORMAL LOW
Hemoglobin: 10.5 — ABNORMAL LOW
Hemoglobin: 10.9 — ABNORMAL LOW
Hemoglobin: 11.1 — ABNORMAL LOW
Hemoglobin: 11.2 — ABNORMAL LOW
Hemoglobin: 11.2 — ABNORMAL LOW
Hemoglobin: 11.2 — ABNORMAL LOW
Hemoglobin: 11.4 — ABNORMAL LOW
Hemoglobin: 12.6
Hemoglobin: 13
MCHC: 33
MCHC: 33.1
MCHC: 33.1
MCHC: 33.2
MCHC: 33.3
MCHC: 33.4
MCHC: 33.5
MCHC: 33.6
MCHC: 33.7
MCV: 87
MCV: 87.2
MCV: 87.6
MCV: 87.9
MCV: 88
MCV: 88.2
MCV: 88.3
MCV: 88.5
MCV: 88.5
MCV: 88.7
Platelets: 109 — ABNORMAL LOW
Platelets: 116 — ABNORMAL LOW
Platelets: 118 — ABNORMAL LOW
Platelets: 150
Platelets: 174
Platelets: 234
Platelets: 74 — ABNORMAL LOW
Platelets: 81 — ABNORMAL LOW
Platelets: 99 — ABNORMAL LOW
RBC: 3.57 — ABNORMAL LOW
RBC: 3.58 — ABNORMAL LOW
RBC: 3.62 — ABNORMAL LOW
RBC: 3.74 — ABNORMAL LOW
RBC: 3.76 — ABNORMAL LOW
RBC: 3.77 — ABNORMAL LOW
RBC: 3.78 — ABNORMAL LOW
RBC: 3.78 — ABNORMAL LOW
RBC: 3.9
RBC: 4.07
RBC: 4.32
RDW: 15 — ABNORMAL HIGH
RDW: 15.1 — ABNORMAL HIGH
RDW: 15.2 — ABNORMAL HIGH
RDW: 15.4 — ABNORMAL HIGH
RDW: 15.4 — ABNORMAL HIGH
RDW: 15.5 — ABNORMAL HIGH
RDW: 15.6 — ABNORMAL HIGH
WBC: 10.1
WBC: 10.7 — ABNORMAL HIGH
WBC: 12 — ABNORMAL HIGH
WBC: 7.1
WBC: 7.2
WBC: 8.8
WBC: 9.2
WBC: 9.2
WBC: 9.5
WBC: 9.6
WBC: 9.9

## 2011-03-23 LAB — FACTOR 5 LEIDEN

## 2011-03-23 LAB — PROTIME-INR
INR: 1
INR: 1
INR: 1.1
INR: 1.1
INR: 1.3
INR: 1.4
INR: 1.5
INR: 1.9 — ABNORMAL HIGH
INR: 2.1 — ABNORMAL HIGH
INR: 2.3 — ABNORMAL HIGH
INR: 3.6 — ABNORMAL HIGH
Prothrombin Time: 13.3
Prothrombin Time: 13.6
Prothrombin Time: 14.4
Prothrombin Time: 14.9
Prothrombin Time: 16.9 — ABNORMAL HIGH
Prothrombin Time: 18.3 — ABNORMAL HIGH
Prothrombin Time: 22.5 — ABNORMAL HIGH
Prothrombin Time: 23.8 — ABNORMAL HIGH
Prothrombin Time: 25.8 — ABNORMAL HIGH
Prothrombin Time: 36.9 — ABNORMAL HIGH

## 2011-03-23 LAB — POCT I-STAT 7, (LYTES, BLD GAS, ICA,H+H)
Calcium, Ion: 1.17
HCT: 28 — ABNORMAL LOW
Operator id: 190201
pCO2 arterial: 44.1
pO2, Arterial: 450 — ABNORMAL HIGH

## 2011-03-23 LAB — BASIC METABOLIC PANEL
BUN: 13
CO2: 26
Calcium: 8 — ABNORMAL LOW
Calcium: 9.4
Chloride: 104
Chloride: 106
GFR calc Af Amer: >60
GFR calc non Af Amer: >60
GFR calc non Af Amer: >60
Glucose, Bld: 102 — ABNORMAL HIGH
Potassium: 4
Potassium: 4.2
Sodium: 136
Sodium: 137
Sodium: 140

## 2011-03-23 LAB — POCT I-STAT 4, (NA,K, GLUC, HGB,HCT)
Glucose, Bld: 112 — ABNORMAL HIGH
Glucose, Bld: 114 — ABNORMAL HIGH
Glucose, Bld: 123 — ABNORMAL HIGH
HCT: 24 — ABNORMAL LOW
HCT: 25 — ABNORMAL LOW
HCT: 31 — ABNORMAL LOW
Hemoglobin: 10.5 — ABNORMAL LOW
Hemoglobin: 8.2 — ABNORMAL LOW
Hemoglobin: 8.5 — ABNORMAL LOW
Operator id: 190201
Operator id: 190201
Potassium: 4.1
Potassium: 4.1
Potassium: 4.2
Sodium: 136
Sodium: 137
Sodium: 137

## 2011-03-23 LAB — TYPE AND SCREEN
ABO/RH(D): AB POS
Antibody Screen: NEGATIVE

## 2011-03-23 LAB — FIBRINOGEN
Fibrinogen: 122 — ABNORMAL LOW
Fibrinogen: 205

## 2011-03-23 LAB — HEPARIN LEVEL (UNFRACTIONATED)
Heparin Unfractionated: 0.24 — ABNORMAL LOW
Heparin Unfractionated: 0.25 — ABNORMAL LOW
Heparin Unfractionated: 0.26 — ABNORMAL LOW
Heparin Unfractionated: 0.32

## 2011-03-23 LAB — POTASSIUM: Potassium: 4.2

## 2011-03-23 LAB — CREATININE, SERUM
Creatinine, Ser: 1.13
GFR calc Af Amer: >60
GFR calc non Af Amer: 52 — ABNORMAL LOW

## 2011-03-23 LAB — ANTITHROMBIN III: AntiThromb III Func: 97

## 2011-03-23 LAB — APTT: aPTT: 30

## 2011-03-23 LAB — PROTEIN S, TOTAL: Protein S Ag, Total: 136 % (ref 70–140)

## 2011-03-24 LAB — BASIC METABOLIC PANEL
CO2: 27
Calcium: 8.2 — ABNORMAL LOW
Creatinine, Ser: 0.87
GFR calc Af Amer: >60

## 2011-03-24 LAB — CBC
HCT: 41.7
Hemoglobin: 14
MCHC: 33.5
MCHC: 33.9
RBC: 3.37 — ABNORMAL LOW
RDW: 15.1 — ABNORMAL HIGH
WBC: 9.1

## 2011-03-24 LAB — BLOOD GAS, ARTERIAL
Acid-base deficit: 1
Drawn by: 274481
O2 Saturation: 96.4
pO2, Arterial: 79.9 — ABNORMAL LOW

## 2011-03-24 LAB — APTT: aPTT: 29

## 2011-03-24 LAB — COMPREHENSIVE METABOLIC PANEL
BUN: 11
Calcium: 9.5
Glucose, Bld: 119 — ABNORMAL HIGH
Sodium: 138
Total Protein: 7

## 2011-03-24 LAB — TYPE AND SCREEN: ABO/RH(D): AB POS

## 2011-03-24 LAB — PROTIME-INR
INR: 1
Prothrombin Time: 13.3

## 2011-03-24 LAB — URINALYSIS, ROUTINE W REFLEX MICROSCOPIC
Protein, ur: NEGATIVE
Urobilinogen, UA: 0.2

## 2011-03-24 LAB — URIC ACID: Uric Acid, Serum: 5.7

## 2012-04-12 ENCOUNTER — Encounter: Payer: Self-pay | Admitting: Vascular Surgery

## 2012-05-24 ENCOUNTER — Encounter: Payer: Self-pay | Admitting: Gastroenterology

## 2012-06-17 ENCOUNTER — Encounter: Payer: Self-pay | Admitting: Gastroenterology

## 2012-06-17 ENCOUNTER — Ambulatory Visit (INDEPENDENT_AMBULATORY_CARE_PROVIDER_SITE_OTHER): Payer: 59 | Admitting: Gastroenterology

## 2012-06-17 ENCOUNTER — Other Ambulatory Visit (INDEPENDENT_AMBULATORY_CARE_PROVIDER_SITE_OTHER): Payer: 59

## 2012-06-17 VITALS — BP 120/64 | HR 81 | Ht 68.0 in | Wt 253.1 lb

## 2012-06-17 DIAGNOSIS — K625 Hemorrhage of anus and rectum: Secondary | ICD-10-CM

## 2012-06-17 DIAGNOSIS — R195 Other fecal abnormalities: Secondary | ICD-10-CM

## 2012-06-17 DIAGNOSIS — R1110 Vomiting, unspecified: Secondary | ICD-10-CM

## 2012-06-17 DIAGNOSIS — K219 Gastro-esophageal reflux disease without esophagitis: Secondary | ICD-10-CM

## 2012-06-17 DIAGNOSIS — K5900 Constipation, unspecified: Secondary | ICD-10-CM

## 2012-06-17 DIAGNOSIS — I739 Peripheral vascular disease, unspecified: Secondary | ICD-10-CM

## 2012-06-17 DIAGNOSIS — R21 Rash and other nonspecific skin eruption: Secondary | ICD-10-CM

## 2012-06-17 DIAGNOSIS — E669 Obesity, unspecified: Secondary | ICD-10-CM

## 2012-06-17 LAB — IBC PANEL: Iron: 145 ug/dL (ref 42–145)

## 2012-06-17 LAB — FOLATE: Folate: 8.2 ng/mL (ref >5.9)

## 2012-06-17 MED ORDER — PEG-KCL-NACL-NASULF-NA ASC-C 100 G PO SOLR: 1.0000 | Freq: Once | ORAL | Status: DC

## 2012-06-17 MED ORDER — LINACLOTIDE 145 MCG PO CAPS: 145.0000 ug | ORAL_CAPSULE | Freq: Every day | ORAL | Status: DC

## 2012-06-17 NOTE — Progress Notes (Signed)
History of Present Illness:  This is a pleasant 52 year old Caucasian female with peripheral vascular disease of her right lower extremity which has required several vascular surgical procedures.  She also is status post total abdominal hysterectomy and removal of her ovaries for uterine cancer several years ago.  She chronically is on Coumadin/Plavix therapy and followed by Dr. Tawanna Cooler Early in vascular surgery.  Her GI problems or valve around 20 years of periodic severe constipation associated with right red blood per rectum.  She had a viral illness this past Christmas with some diffuse abdominal pain and swelling which seems to have improved.  However, she continues to complain of intermittent episodes her brother severe sudden abdominal spasms which are very intermittent and very infrequent without real precipitating or alleviating elements.  She has chronic hard bowel movements without melena or maroonish stools .  She is followed at [facility] family practice, and recent lab data has shown a normal CBC and liver profile.  The patient does have type 2 diabetes on metformon meds and chronic thyroid dysfunction.  Patient describes many years of rather severe acid reflux with regurgitation and burning substernal chest pain without dysphagia.  She's been on Dexilant 60 mg a day for many years. There is no history of previous endoscopy or colonoscopy.  Family history is noncontributory.  She denies any specific food intolerances or history of celiac disease.  I have reviewed this patient's present history, medical and surgical past history, allergies and medications.     ROS: The remainder of the 10 point ROS is negative: Specifically no cardiovascular or pulmonary complaints this time.  She does have a chronic papular skin rash, and apparently has had dermatologic evaluation.  She denies pain in her right lower extremity but does have bilateral peripheral edema.  Patient denies previous MI, CVA,  brother embolic problems.  Allergies  Allergen Reactions  . Cortisone   . Prednisone     Makes internal organs swell   Outpatient Prescriptions Prior to Visit  Medication Sig Dispense Refill  . aspirin 325 MG tablet Take 325 mg by mouth daily.      . Cholecalciferol (VITAMIN D) 1000 UNITS capsule Take 5,000 Units by mouth daily.      . clopidogrel (PLAVIX) 75 MG tablet Take 75 mg by mouth daily.      Marland Kitchen dexlansoprazole (DEXILANT) 60 MG capsule Take 60 mg by mouth daily.      Tery Sanfilippo Calcium (STOOL SOFTENER PO) Take by mouth as directed.      . escitalopram (LEXAPRO) 20 MG tablet Take 40 mg by mouth daily.      . furosemide (LASIX) 20 MG tablet Take 20 mg by mouth daily.      Marland Kitchen glimepiride (AMARYL) 4 MG tablet Take 4 mg by mouth daily before breakfast.      . levothyroxine (SYNTHROID, LEVOTHROID) 100 MCG tablet Take 100 mcg by mouth daily.      . metFORMIN (GLUCOPHAGE) 1000 MG tablet Take 1,000 mg by mouth 2 (two) times daily with a meal.      . olmesartan (BENICAR) 5 MG tablet Take 10 mg by mouth daily.      . rosuvastatin (CRESTOR) 10 MG tablet Take 10 mg by mouth daily.       Last reviewed on 06/17/2012 10:30 AM by Mardella Layman, MD Past Medical History  Diagnosis Date  . DM (diabetes mellitus)   . Hypertension   . Hypothyroid   . Hyperlipemia   .  DVT (deep venous thrombosis)     x5  . GERD (gastroesophageal reflux disease)   . UTI (urinary tract infection)   . Family history of colon cancer   . PVD (peripheral vascular disease)   . Uterine cancer   . Depression   . Obesity    Past Surgical History  Procedure Date  . Abdominal hysterectomy   . Toe amputation     right  . Lumbar disc surgery     L4-L5  . Femoral bypass     x 5  . Cesarean section   . Tonsillectomy    History   Social History  . Marital Status: Married    Spouse Name: N/A    Number of Children: 1  . Years of Education: N/A   Occupational History  . teacher    Social History Main  Topics  . Smoking status: Current Every Day Smoker -- 0.5 packs/day for 38 years    Types: Cigarettes  . Smokeless tobacco: Never Used  . Alcohol Use: No  . Drug Use: No  . Sexually Active: None   Other Topics Concern  . None   Social History Narrative  . None   Family History  Problem Relation Age of Onset  . Colon cancer Brother 29  . Hypertension Mother   . Hypothyroidism Mother   . Heart disease Father   . Diabetes Father   . Hyperlipidemia Father   . Clotting disorder Brother   . Diabetes Brother   . Heart disease Brother        Physical Exam: Blood pressure 120/64, pulse 81 and regular, and weight 253 pounds with a BMI of 38.49. General well developed well nourished patient in no acute distress, appearing their stated age Eyes PERRLA, no icterus, fundoscopic exam per opthamologist Skin at her skin rash on anterior abdominal wall and lower extremities with some excoriations. Neck supple, no adenopathy, no thyroid enlargement, no tenderness Chest clear to percussion and auscultation Heart no significant murmurs, gallops or rubs noted Abdomen no hepatosplenomegaly masses or tenderness, BS normal.  Rectal inspection normal no fissures, or fistulae noted.  No masses or tenderness on digital exam.  Hard impacted stool in rectal vault which is guaiac positive. Extremities no acute joint lesions, edema, phlebitis or evidence of cellulitis.Marland Kitchen decreased blood flow in both pretibial areas with trace edema right greater than left.  I cannot see evidence of active phlebitis.. Neurologic patient oriented x 3, cranial nerves intact, no focal neurologic deficits noted. Psychological mental status normal and normal affect.  Assessment and plan: Obese patient with chronic GERD doing well on daily PPI therapy.  We have scheduled endoscopy to exclude Barrett's mucosa because of the chronicity of her acid reflux problems..  Her main complaint is chronic constipation, and she has a guaiac  positive stool without previous colonoscopy exam.  We'll schedule colonoscopy, and we'll hold her Coumadin and Plavix 5-7 days before this procedure while continuing salicylates.  We will consult with Dr. Lacinda Axon office to be sure that this course of action is appropriate, and that she does not need Lovenox coverage.  For constipation I placed her on Linzess 145 mcg a day and MiraLax 8 ounces at bedtime.  I have reviewed a constipation regime with a high fiber diet and daily Metamucil, liberal by mouth fluids, and also a standard antireflux regime.  I suspect this patient does have underlying colonic polyposis, and we need to exclude colon carcinoma.  She seems to be stable  from a cardiovascular standpoint, and has not have had to have peripheral vascular surgery in over 5 years.  She does have a brother who has some type of clotting disorder with DVTs.  I did order a day and anemia and a celiac profile.  Her skin rash almost resembles dermatitis herpetiformis.  If this patient has not had hematology consultation, I think this would be in order because of her complex any coagulation requirements.  Otherwise she appears very stable from a cardiovascular and pulmonary standpoint.    CC Dr. Leretha Pol CC Dr. Rudi Heap Encounter Diagnoses  Name Primary?  . Constipation Yes  . Vomiting

## 2012-06-17 NOTE — Patient Instructions (Addendum)
You have been scheduled for an endoscopy and colonoscopy with propofol. Please follow the written instructions given to you at your visit today. Please pick up your prep at the pharmacy within the next 1-3 days. If you use inhalers (even only as needed) or a CPAP machine, please bring them with you on the day of your procedure. You will be contacted by our office prior to your procedure for directions on holding your Plavix/Coumadin.  If you do not hear from our office 1 week prior to your scheduled procedure, please call [phone] to discuss.  We have sent the following medications to your pharmacy for you to pick up at your convenience: Linzess Please start Miralax daily at bedtime as directed, you can purchase this over the counter at your drug store or [facility]. Your physician has requested that you go to the basement for  lab work before leaving today. CC:  Rudi Heap MD

## 2012-06-20 ENCOUNTER — Telehealth: Payer: Self-pay

## 2012-06-20 LAB — CELIAC PANEL 10
Endomysial Screen: NEGATIVE
Gliadin IgA: 9.6 U/mL (ref <20)
Gliadin IgG: 6.8 U/mL (ref <20)
Tissue Transglut Ab: 7.8 U/mL (ref <20)

## 2012-06-20 NOTE — Telephone Encounter (Signed)
Unable to reach pt the phone was not accepting messages

## 2012-06-21 ENCOUNTER — Telehealth: Payer: Self-pay | Admitting: *Deleted

## 2012-06-21 MED ORDER — LUBIPROSTONE 24 MCG PO CAPS: 24.0000 ug | ORAL_CAPSULE | Freq: Two times a day (BID) | ORAL | Status: DC

## 2012-06-21 NOTE — Telephone Encounter (Signed)
I tried contacting patient on home/cell phone but voice mail full cannot leave message.  I left message on patients voicemail at her place of employment to call me back.  Linzess is not covered by patients insurance but patient can try Amitiza. I tried to contact patient to see if she tried Amitiza but had to leave a message. Sent Amitiza to patients pharmacy.  Waiting on call back

## 2012-06-22 NOTE — Telephone Encounter (Signed)
Pt is aware to hold her coumadin and plavix prior to the procedure and was notified that her linzess is not covered and amitiza has been sent to the pharmacy

## 2012-06-22 NOTE — Telephone Encounter (Signed)
Left message on machine to call back  

## 2012-07-01 ENCOUNTER — Encounter: Payer: Self-pay | Admitting: Gastroenterology

## 2012-07-01 ENCOUNTER — Ambulatory Visit (AMBULATORY_SURGERY_CENTER): Payer: 59 | Admitting: Gastroenterology

## 2012-07-01 VITALS — BP 153/99 | HR 59 | Temp 97.9°F | Resp 21 | Ht 68.0 in | Wt 253.0 lb

## 2012-07-01 DIAGNOSIS — K219 Gastro-esophageal reflux disease without esophagitis: Secondary | ICD-10-CM

## 2012-07-01 DIAGNOSIS — K625 Hemorrhage of anus and rectum: Secondary | ICD-10-CM

## 2012-07-01 DIAGNOSIS — K5900 Constipation, unspecified: Secondary | ICD-10-CM

## 2012-07-01 DIAGNOSIS — Z1211 Encounter for screening for malignant neoplasm of colon: Secondary | ICD-10-CM

## 2012-07-01 LAB — GLUCOSE, CAPILLARY: Glucose-Capillary: 87 mg/dL (ref 70–99)

## 2012-07-01 MED ORDER — DEXTROSE 5 % IV SOLN: INTRAVENOUS | Status: DC

## 2012-07-01 NOTE — Progress Notes (Signed)
Pts IV infiltrated upon arrival to procedure room Restart # 20 left forearm. nonfunctional IV d/c'd  Mouth guard in and out and out for EGD No dental trauma

## 2012-07-01 NOTE — Progress Notes (Signed)
Patient did not experience any of the following events: a burn prior to discharge; a fall within the facility; wrong site/side/patient/procedure/implant event; or a hospital transfer or hospital admission upon discharge from the facility. (G8907) Patient did not have preoperative order for IV antibiotic SSI prophylaxis. (G8918)  

## 2012-07-01 NOTE — Patient Instructions (Addendum)

## 2012-07-01 NOTE — Op Note (Signed)
 [facility] [address] [place], [address]   ENDOSCOPY PROCEDURE REPORT  PATIENT: [name], [name].  MR#: [id number] BIRTHDATE: Feb 04, 1961 , 51  yrs. old GENDER: Female ENDOSCOPIST:Liem Copenhaver Hale Bogus, MD, [facility] REFERRED BY: Rudi Heap, M.D. PROCEDURE DATE:  07/01/2012 PROCEDURE:   EGD w/ biopsy for H.pylori ASA CLASS:    Class III INDICATIONS: History of reflux esophagitis. MEDICATION: There was residual sedation effect present from prior procedure and Propofol (Diprivan) 140 mg IV TOPICAL ANESTHETIC:  DESCRIPTION OF PROCEDURE:   After the risks and benefits of the procedure were explained, informed consent was obtained.  The LB GIF-H180 [id number]  endoscope was introduced through the mouth  and advanced to the second portion of the duodenum .  The instrument was slowly withdrawn as the mucosa was fully examined.    The esophagus was generally unremarkable, but there was a 4-5 cm sliding hiatal hernia.  .  There is dried heme in the stomach and mild antral gastritis without ulcerations.  Pylori channel and duodenal bulb and post bulbar area was unremarkable.  Biopsies were obtained of the gastric mucosa for CLO test.  Retroflexed view the fundus and cardia stomach otherwise was unremarkable except for prominent hiatal hernia.   There is dried heme in the stomach and mild antral gastritis without ulcerations.  Pylori channel and duodenal bulb and post bulbar area was unremarkable.  Biopsies were obtained of the gastric mucosa for CLO test.  Retroflexed view the fundus and cardia stomach otherwise was unremarkable except for prominent hiatal hernia.    Retroflexed views revealed a hiatal hernia.    The scope was then withdrawn from the patient and the procedure completed.  COMPLICATIONS: There were no complications.   ENDOSCOPIC IMPRESSION: 1.   The esophagus was generally unremarkable, but there was a 4-5 cm sliding hiatal hernia.   There is dried  heme in the stomach and mild antral gastritis without ulcerations.  Pylori channel and duodenal bulb and post bulbar area was unremarkable.  Biopsies were obtained of the gastric mucosa for CLO test.  Retroflexed view the fundus and cardia stomach otherwise was unremarkable except for prominent hiatal hernia. 2 ,Chronic GERD  RECOMMENDATIONS: 1.  Await biopsy results 2.  Continue PPI    _______________________________ eSigned:  Mardella Layman, MD, [facility] 07/01/2012 4:12 PM      PATIENT NAME:  [name]. MR#: [id number]

## 2012-07-01 NOTE — Op Note (Signed)
[facility] [address] [place], [address]   COLONOSCOPY PROCEDURE REPORT  PATIENT: [name], [name].  MR#: [id number] BIRTHDATE: 06/27/1960 , 51  yrs. old GENDER: Female ENDOSCOPIST: Mardella Layman, MD, [facility] REFERRED BY:  Rudi Heap, M.D. PROCEDURE DATE:  07/01/2012 PROCEDURE:   Colonoscopy with snare polypectomy ASA CLASS:   Class III INDICATIONS:Patient's family history of colon polyps. MEDICATIONS: propofol (Diprivan) 200mg  IV  DESCRIPTION OF PROCEDURE:   After the risks and benefits and of the procedure were explained, informed consent was obtained.  A digital rectal exam revealed no abnormalities of the rectum.    The LB CF-H180AL [id number]  endoscope was introduced through the anus and advanced to the cecum, which was identified by both the appendix and ileocecal valve .  The quality of the prep was excellent, using MoviPrep .  The instrument was then slowly withdrawn as the colon was fully examined.     COLON FINDINGS: A smooth flat polyp ranging between 3-64mm in size was found at the hepatic flexure.  A polypectomy was performed with a cold snare.  The resection was complete and the polyp tissue was not retrieved.   The colon was otherwise normal.  There was no diverticulosis, inflammation, polyps or cancers unless previously stated.     Retroflexed views revealed no abnormalities.     The scope was then withdrawn from the patient and the procedure completed.  COMPLICATIONS: There were no complications. ENDOSCOPIC IMPRESSION: 1.   Flat polyp ranging between 3-38mm in size was found at the hepatic flexure; polypectomy was performed with a cold snare 2.   The colon was otherwise normal  RECOMMENDATIONS: 1.  Repeat Colonoscopy in 5 years. 2.  Continue current medications   REPEAT EXAM:  cc:  _______________________________ eSignedMardella Layman, MD, [facility] 07/01/2012 3:57 PM

## 2012-07-01 NOTE — Progress Notes (Signed)
Per Dr. Norval Gable verbal order, pt to restart Coumadin and Plavix today

## 2012-07-01 NOTE — Progress Notes (Signed)
Pt stable awake HOB elevated to RR Report to RN

## 2012-07-04 ENCOUNTER — Telehealth: Payer: Self-pay | Admitting: *Deleted

## 2012-07-04 ENCOUNTER — Encounter: Payer: Self-pay | Admitting: Gastroenterology

## 2012-07-04 NOTE — Telephone Encounter (Signed)
  Follow up Call-  Call back number 07/01/2012  Post procedure Call Back phone  # [phone]  Permission to leave phone message Yes    [facility]

## 2012-07-26 ENCOUNTER — Ambulatory Visit: Payer: 59 | Admitting: Gastroenterology

## 2012-10-18 ENCOUNTER — Encounter: Payer: Self-pay | Admitting: Physician Assistant

## 2012-10-18 ENCOUNTER — Ambulatory Visit (INDEPENDENT_AMBULATORY_CARE_PROVIDER_SITE_OTHER): Payer: 59 | Admitting: Physician Assistant

## 2012-10-18 VITALS — BP 107/66 | HR 86 | Temp 97.4°F | Ht 68.0 in | Wt 255.0 lb

## 2012-10-18 DIAGNOSIS — K219 Gastro-esophageal reflux disease without esophagitis: Secondary | ICD-10-CM | POA: Insufficient documentation

## 2012-10-18 DIAGNOSIS — K449 Diaphragmatic hernia without obstruction or gangrene: Secondary | ICD-10-CM

## 2012-10-18 NOTE — Patient Instructions (Addendum)

## 2012-10-18 NOTE — Progress Notes (Addendum)
Subjective:     Patient ID: [name], female   DOB: Nov 06, 1960, 52 y.o.   MRN: [id number]  Abdominal Pain This is a recurrent problem. The current episode started 1 to 4 weeks ago. The onset quality is sudden. The problem occurs intermittently. The most recent episode lasted 1 week. The problem has been unchanged. The pain is located in the epigastric region. The pain is at a severity of 5/10. The pain is moderate. The quality of the pain is colicky. The abdominal pain does not radiate. Associated symptoms include belching, dysuria, frequency and nausea. The pain is aggravated by eating. The pain is relieved by vomiting. She has tried proton pump inhibitors for the symptoms. The treatment provided no relief. Prior diagnostic workup includes upper endoscopy and GI consult.  Emesis  This is a chronic problem. The problem occurs 2 to 4 times per day. The problem has been unchanged. The emesis has an appearance of bile and stomach contents. There has been no fever. The treatment provided no relief.     Review of Systems  Gastrointestinal: Positive for nausea.  Genitourinary: Positive for dysuria and frequency.       Objective:   Physical Exam  Cardiovascular: Normal rate, regular rhythm, S1 normal, S2 normal and normal heart sounds.   Pulmonary/Chest: Effort normal and breath sounds normal.  Abdominal: Soft. Normal appearance and bowel sounds are normal. There is no hepatosplenomegaly, splenomegaly or hepatomegaly. There is tenderness in the epigastric area. There is no rigidity, no rebound and no guarding.       Assessment:     1. Acid reflux   2. GERD (gastroesophageal reflux disease)   3. Hernia, hiatal        Plan:     Will refer back to GI Increase Dexilant to bid dose No ETOH/caff intake Stay upright 20 min after eating F/U pending GI consult

## 2012-10-19 ENCOUNTER — Telehealth: Payer: Self-pay

## 2012-10-19 DIAGNOSIS — K219 Gastro-esophageal reflux disease without esophagitis: Secondary | ICD-10-CM

## 2012-10-19 MED ORDER — DEXLANSOPRAZOLE 60 MG PO CPDR: 60.0000 mg | DELAYED_RELEASE_CAPSULE | Freq: Every day | ORAL | Status: DC

## 2012-10-19 MED ORDER — DEXLANSOPRAZOLE 60 MG PO CPDR: 60.0000 mg | DELAYED_RELEASE_CAPSULE | Freq: Two times a day (BID) | ORAL | Status: DC

## 2012-10-19 NOTE — Telephone Encounter (Signed)
Patient requesting something for nausea. Discussed with Montey Hora, PA-C and he had increased Dexilant to BID which should help with nausea. Patient notified.  Also notified that GI appt is 10/25/12 with Dr. Jarold Motto.

## 2012-10-19 NOTE — Telephone Encounter (Signed)
Wants referral to GI

## 2012-10-25 ENCOUNTER — Ambulatory Visit (INDEPENDENT_AMBULATORY_CARE_PROVIDER_SITE_OTHER): Payer: 59 | Admitting: Gastroenterology

## 2012-10-25 ENCOUNTER — Other Ambulatory Visit (INDEPENDENT_AMBULATORY_CARE_PROVIDER_SITE_OTHER): Payer: 59

## 2012-10-25 ENCOUNTER — Encounter: Payer: Self-pay | Admitting: Gastroenterology

## 2012-10-25 VITALS — BP 118/78 | HR 76 | Ht 67.75 in | Wt 258.4 lb

## 2012-10-25 DIAGNOSIS — Z8542 Personal history of malignant neoplasm of other parts of uterus: Secondary | ICD-10-CM

## 2012-10-25 DIAGNOSIS — R197 Diarrhea, unspecified: Secondary | ICD-10-CM

## 2012-10-25 DIAGNOSIS — R1013 Epigastric pain: Secondary | ICD-10-CM

## 2012-10-25 DIAGNOSIS — K219 Gastro-esophageal reflux disease without esophagitis: Secondary | ICD-10-CM

## 2012-10-25 DIAGNOSIS — I739 Peripheral vascular disease, unspecified: Secondary | ICD-10-CM

## 2012-10-25 DIAGNOSIS — R112 Nausea with vomiting, unspecified: Secondary | ICD-10-CM

## 2012-10-25 LAB — COMPREHENSIVE METABOLIC PANEL
BUN: 12 mg/dL (ref 6–23)
CO2: 30 mEq/L (ref 19–32)
Creatinine, Ser: 0.8 mg/dL (ref 0.4–1.2)
GFR: 79.46 mL/min (ref >60.00)
Glucose, Bld: 69 mg/dL — ABNORMAL LOW (ref 70–99)
Total Bilirubin: 0.3 mg/dL (ref 0.3–1.2)

## 2012-10-25 LAB — CBC WITH DIFFERENTIAL/PLATELET
Basophils Absolute: 0.1 10*3/uL (ref 0.0–0.1)
Basophils Relative: 0.5 % (ref 0.0–3.0)
Eosinophils Absolute: 1.4 10*3/uL — ABNORMAL HIGH (ref 0.0–0.7)
Hemoglobin: 15.1 g/dL — ABNORMAL HIGH (ref 12.0–15.0)
MCHC: 34.4 g/dL (ref 30.0–36.0)
MCV: 87.9 fl (ref 78.0–100.0)
Monocytes Absolute: 0.7 10*3/uL (ref 0.1–1.0)
Neutro Abs: 6.6 10*3/uL (ref 1.4–7.7)
RBC: 4.99 Mil/uL (ref 3.87–5.11)
RDW: 13.8 % (ref 11.5–14.6)

## 2012-10-25 LAB — IBC PANEL
Iron: 65 ug/dL (ref 42–145)
Transferrin: 268.4 mg/dL (ref 212.0–360.0)

## 2012-10-25 LAB — HEPATIC FUNCTION PANEL
ALT: 19 U/L (ref 0–35)
AST: 20 U/L (ref 0–37)
Alkaline Phosphatase: 42 U/L (ref 39–117)
Bilirubin, Direct: 0.1 mg/dL (ref 0.0–0.3)
Total Bilirubin: 0.3 mg/dL (ref 0.3–1.2)

## 2012-10-25 LAB — TSH: TSH: 1.55 u[IU]/mL (ref 0.35–5.50)

## 2012-10-25 LAB — FOLATE: Folate: 7.3 ng/mL (ref >5.9)

## 2012-10-25 LAB — LIPASE: Lipase: 26 U/L (ref 11.0–59.0)

## 2012-10-25 NOTE — Patient Instructions (Addendum)
Your physician has requested that you go to the basement for lab work before leaving today.  You have been scheduled for a CT scan of the abdomen and pelvis at [facility] ([address]---this is in the same building as Architectural technologist).   You are scheduled on 10-27-12 at 2:30 pm. You should arrive 15 minutes prior to your appointment time for registration. Please follow the written instructions below on the day of your exam:  WARNING: IF YOU ARE ALLERGIC TO IODINE/X-RAY DYE, PLEASE NOTIFY RADIOLOGY IMMEDIATELY AT [phone]! YOU WILL BE GIVEN A 13 HOUR PREMEDICATION PREP.  1) Do not eat or drink anything after 10:30 am (4 hours prior to your test)  You may take any medications as prescribed with a small amount of water except for the following: Metformin, Glucophage, Glucovance, Avandamet, Riomet, Fortamet, Actoplus Met, Janumet, Glumetza or Metaglip. The above medications must be held the day of the exam AND 48 hours after the exam.  The purpose of you drinking the oral contrast is to aid in the visualization of your intestinal tract. The contrast solution may cause some diarrhea. Before your exam is started, you will be given a small amount of fluid to drink. Depending on your individual set of symptoms, you may also receive an intravenous injection of x-ray contrast/dye. Plan on being at [facility] for 30 minutes or long, depending on the type of exam you are having performed.  This test typically takes 30-45 minutes to complete.  If you have any questions regarding your exam or if you need to reschedule, you may call the CT department at [phone] between the hours of 8:00 am and 5:00 pm, Monday-Friday.  ________________________________________________________________________

## 2012-10-25 NOTE — Progress Notes (Signed)
This is a nice 52 year old Caucasian female smoker with peripheral vascular disease his had previous femoral-popliteal bypass surgery and is chronically on Coumadin.  He also is status post total abdominal hysterectomy and removal of her ovaries in 2011 because of uterine cancer.  Since December this year she's had recurrent episodes of upper abdominal pain with nausea and vomiting and periodic watery diarrhea.  Endoscopy and colonoscopy were negative except for a 4-5 cm hiatal hernia, she's been on regular PPI therapy.  She has is periodic episodes of severe pain with nausea and vomiting occur every several months without real precipitating or alleviating elements.  She denies a history of angina, but does have mild COPD and dyspnea on exertion.  There is no history of alcohol abuse, previous hepatitis or pancreatitis, anorexia, weight loss, or other systemic complaints.  She denies abuse of NSAIDs.  In addition the patient is on Plavix, also oral medications for adult onset diabetes.   Current Medications, Allergies, Past Medical History, Past Surgical History, Family History and Social History were reviewed in [employer] record.  ROS: All systems were reviewed and are negative unless otherwise stated in the HPI.          Physical Exam: At pressure 118/78, pulse 76 and weight 258 with a BMI of 39.57.  97% oxygen saturation on room air.  Chest is clear without wheezes or rhonchi.  She appear to be in a regular rhythm without murmurs gallops or rubs.  Her abdomen is markedly obese without definite organomegaly, masses or localized tenderness.  Bowel sounds are nonobstructive, and there is no succussion splash.  There is no evidence of phlebitis, but the patient does have multiple ecchymoses on his and trunk area.  Mental status is normal    Assessment and Plan: Recurrent, severe intermittent upper abdominal pain with nausea vomiting, rule out recurrent pancreatitis,  cholelithiasis, or ischemic bowel problems.  We'll check labs today including CBC, metabolic and anemia profile, and schedule CT scan of the abdomen and pelvis.  As mentioned above, she is status post surgical hysterectomy for uterine cancer.  I cannot appreciate hepatomegaly or any evidence of definite ascites on examination, but I can this is an underlying anatomical cause for her recurrent symptomatology, at the time of CT scan will last him to pay attention to her mesenteric arteries and intestinal areas.  She is a smoker, has had previous peripheral vascular problems and bypass surgery.  Now we will continue current medications.  Her clinical picture does not seem consistent with diabetic gastroparesis.

## 2012-10-27 ENCOUNTER — Other Ambulatory Visit: Payer: 59

## 2012-10-27 ENCOUNTER — Telehealth: Payer: Self-pay | Admitting: *Deleted

## 2012-10-27 NOTE — Telephone Encounter (Signed)
[employer] will not approve pt's CT Angio; they wanted a Peer to Peer , but Dr Jarold Motto stated it was OK to do a regular CT w/ special attention to mesenteric arteries. Spoke with pt to inform her and she can't come today to get contrast before the CT today, so she would like it r/s to 11/04/12 at 2:30pm Eventually  [facility] approved the CT with contrast. R/S with Rose and pt will pick up instructions and contrast here.

## 2012-11-04 ENCOUNTER — Ambulatory Visit (INDEPENDENT_AMBULATORY_CARE_PROVIDER_SITE_OTHER)
Admission: RE | Admit: 2012-11-04 | Discharge: 2012-11-04 | Disposition: A | Discharge status: Home / Self Care | Payer: 59 | Source: Ambulatory Visit | Attending: Gastroenterology | Admitting: Gastroenterology

## 2012-11-04 DIAGNOSIS — R112 Nausea with vomiting, unspecified: Secondary | ICD-10-CM

## 2012-11-04 DIAGNOSIS — R197 Diarrhea, unspecified: Secondary | ICD-10-CM

## 2012-11-04 MED ORDER — IOHEXOL 300 MG/ML  SOLN
100.0000 mL | Freq: Once | INTRAMUSCULAR | Status: AC | PRN
  Administered 2012-11-04: 100 mL via INTRAVENOUS

## 2012-11-07 ENCOUNTER — Telehealth: Payer: Self-pay | Admitting: *Deleted

## 2012-11-07 DIAGNOSIS — R197 Diarrhea, unspecified: Secondary | ICD-10-CM

## 2012-11-07 DIAGNOSIS — R112 Nausea with vomiting, unspecified: Secondary | ICD-10-CM

## 2012-11-07 NOTE — Telephone Encounter (Signed)
Informed pt of normal CT and that Dr Jarold Motto recommends a GES. Pt agreed and prefers Fridays. Scheduled for 11/18/12 arrive at 6:45am at [facility]; hold stomach meds for 24 hours and NPO after midnight. lmom to call back.

## 2012-11-07 NOTE — Telephone Encounter (Signed)
Message copied by Florene Glen on Mon Nov 07, 2012  9:53 AM ------      Message from: Jarold Motto, DAVID R      Created: Mon Nov 07, 2012  9:05 AM       CT is OK...needs gastric empting scan ------

## 2012-11-07 NOTE — Telephone Encounter (Signed)
Informed pt of appt time, location and prep; pt stated understanding.

## 2012-11-16 ENCOUNTER — Telehealth: Payer: Self-pay | Admitting: Gastroenterology

## 2012-11-16 NOTE — Telephone Encounter (Signed)
Cancelled scan; left message for pt to call if she needs to r/s.

## 2012-11-18 ENCOUNTER — Encounter (HOSPITAL_COMMUNITY): Payer: 59

## 2012-12-12 ENCOUNTER — Other Ambulatory Visit: Payer: Self-pay | Admitting: Physician Assistant

## 2012-12-16 ENCOUNTER — Other Ambulatory Visit: Payer: Self-pay

## 2012-12-19 ENCOUNTER — Encounter (INDEPENDENT_AMBULATORY_CARE_PROVIDER_SITE_OTHER): Payer: 59 | Admitting: *Deleted

## 2012-12-19 NOTE — Progress Notes (Signed)
This encounter was created in error - please disregard.

## 2012-12-21 ENCOUNTER — Ambulatory Visit (INDEPENDENT_AMBULATORY_CARE_PROVIDER_SITE_OTHER): Payer: 59 | Admitting: General Practice

## 2012-12-21 ENCOUNTER — Encounter: Payer: Self-pay | Admitting: General Practice

## 2012-12-21 VITALS — BP 117/80 | HR 65 | Temp 96.7°F | Ht 68.0 in | Wt 250.0 lb

## 2012-12-21 DIAGNOSIS — K219 Gastro-esophageal reflux disease without esophagitis: Secondary | ICD-10-CM

## 2012-12-21 DIAGNOSIS — F329 Major depressive disorder, single episode, unspecified: Secondary | ICD-10-CM

## 2012-12-21 DIAGNOSIS — E785 Hyperlipidemia, unspecified: Secondary | ICD-10-CM

## 2012-12-21 DIAGNOSIS — F32A Depression, unspecified: Secondary | ICD-10-CM

## 2012-12-21 DIAGNOSIS — E039 Hypothyroidism, unspecified: Secondary | ICD-10-CM

## 2012-12-21 DIAGNOSIS — E119 Type 2 diabetes mellitus without complications: Secondary | ICD-10-CM

## 2012-12-21 DIAGNOSIS — I739 Peripheral vascular disease, unspecified: Secondary | ICD-10-CM

## 2012-12-21 DIAGNOSIS — F3289 Other specified depressive episodes: Secondary | ICD-10-CM

## 2012-12-21 DIAGNOSIS — Z09 Encounter for follow-up examination after completed treatment for conditions other than malignant neoplasm: Secondary | ICD-10-CM

## 2012-12-21 DIAGNOSIS — R300 Dysuria: Secondary | ICD-10-CM

## 2012-12-21 DIAGNOSIS — I10 Essential (primary) hypertension: Secondary | ICD-10-CM

## 2012-12-21 LAB — POCT URINALYSIS DIPSTICK
Glucose, UA: NEGATIVE
Ketones, UA: NEGATIVE
Spec Grav, UA: 1.015

## 2012-12-21 LAB — POCT CBC
Hemoglobin: 15.9 g/dL (ref 12.2–16.2)
Lymph, poc: 4.4 — AB (ref 0.6–3.4)
MCH, POC: 29.8 pg (ref 27–31.2)
MCHC: 34 g/dL (ref 31.8–35.4)
MPV: 9.2 fL (ref 0–99.8)
POC LYMPH PERCENT: 38.9 %L (ref 10–50)
RDW, POC: 13.6 %
WBC: 11.4 10*3/uL — AB (ref 4.6–10.2)

## 2012-12-21 LAB — POCT UA - MICROSCOPIC ONLY: WBC, Ur, HPF, POC: NEGATIVE

## 2012-12-21 MED ORDER — ROSUVASTATIN CALCIUM 10 MG PO TABS: 10.0000 mg | ORAL_TABLET | Freq: Every day | ORAL | Status: DC

## 2012-12-21 MED ORDER — LEVOTHYROXINE SODIUM 100 MCG PO TABS: 100.0000 ug | ORAL_TABLET | Freq: Every day | ORAL | Status: DC

## 2012-12-21 MED ORDER — CLOPIDOGREL BISULFATE 75 MG PO TABS: 75.0000 mg | ORAL_TABLET | Freq: Every day | ORAL | Status: DC

## 2012-12-21 MED ORDER — OLMESARTAN MEDOXOMIL 20 MG PO TABS: 20.0000 mg | ORAL_TABLET | Freq: Every day | ORAL | Status: DC

## 2012-12-21 MED ORDER — GLIMEPIRIDE 4 MG PO TABS: 4.0000 mg | ORAL_TABLET | Freq: Every day | ORAL | Status: DC

## 2012-12-21 MED ORDER — DEXLANSOPRAZOLE 60 MG PO CPDR: 60.0000 mg | DELAYED_RELEASE_CAPSULE | Freq: Two times a day (BID) | ORAL | Status: DC

## 2012-12-21 MED ORDER — ESCITALOPRAM OXALATE 20 MG PO TABS: 20.0000 mg | ORAL_TABLET | Freq: Every day | ORAL | Status: DC

## 2012-12-21 NOTE — Progress Notes (Signed)
Subjective:    Patient ID: [name], female    DOB: 1961/05/19, 52 y.o.   MRN: [id number]  HPI Patient presents today for follow up of chronic health conditions. She has hypertension, gerd, diabetes, PVD, and she is a smoker. She reports having several episodes of nausea, vomiting, and diarrhea since December. She was hospitalized and endoscopy performed. She reports being diagnosed with hiatal hernia. She reports changing her eating habits (no fried food, no fast foods, no tomato based foods). Patient reports that she stopped taking metformin to see if this helps episodes of diarrhea, nausea, vomiting. She reports blood sugars were running around 120, but hasn't checked it since stopping metformin.     Review of Systems  Constitutional: Negative for fever and chills.  Respiratory: Negative for chest tightness and shortness of breath.   Cardiovascular: Negative for chest pain and palpitations.  Gastrointestinal: Negative for nausea, vomiting, abdominal pain and blood in stool.       Reports abdominal pain intermittently, denies pain at this time  Genitourinary: Negative for dysuria, hematuria and difficulty urinating.  Neurological: Negative for dizziness, weakness and headaches.       Objective:   Physical Exam  Constitutional: She is oriented to person, place, and time. She appears well-developed and well-nourished.  HENT:  Head: Normocephalic and atraumatic.  Right Ear: External ear normal.  Left Ear: External ear normal.  Mouth/Throat: Oropharynx is clear and moist.  Neck: Normal range of motion.  Cardiovascular: Normal rate, regular rhythm and normal heart sounds.   Pulmonary/Chest: Effort normal and breath sounds normal. No respiratory distress. She exhibits no tenderness.  Abdominal: Soft. Bowel sounds are normal. She exhibits no distension. There is no tenderness.  Neurological: She is alert and oriented to person, place, and time.  Skin: Skin is warm and dry.   Psychiatric: She has a normal mood and affect.   Results for orders placed in visit on 12/21/12  POCT URINALYSIS DIPSTICK      Result Value Range   Color, UA yellow     Clarity, UA clear     Glucose, UA neg     Bilirubin, UA neg     Ketones, UA neg     Spec Grav, UA 1.015     Blood, UA trace     pH, UA 7.5     Protein, UA mod     Urobilinogen, UA negative     Nitrite, UA neg     Leukocytes, UA Negative    POCT UA - MICROSCOPIC ONLY      Result Value Range   WBC, Ur, HPF, POC neg     RBC, urine, microscopic occ     Bacteria, U Microscopic few     Mucus, UA trace     Epithelial cells, urine per micros occ     Crystals, Ur, HPF, POC occ     Casts, Ur, LPF, POC neg     Yeast, UA neg           Assessment & Plan:  1. Dysuria - POCT urinalysis dipstick - POCT UA - Microscopic Only  2. Acid reflux - dexlansoprazole (DEXILANT) 60 MG capsule; Take 1 capsule (60 mg total) by mouth 2 (two) times daily.  Dispense: 60 capsule; Refill: 3  3. Unspecified hypothyroidism - levothyroxine (SYNTHROID, LEVOTHROID) 100 MCG tablet; Take 1 tablet (100 mcg total) by mouth daily.  Dispense: 30 tablet; Refill: 3 - Thyroid Panel With TSH  4. Other and unspecified hyperlipidemia -  rosuvastatin (CRESTOR) 10 MG tablet; Take 1 tablet (10 mg total) by mouth daily.  Dispense: 30 tablet; Refill: 0 - NMR Lipoprofile with Lipids  5. Follow-up exam, 3-6 months since previous exam - POCT CBC - COMPLETE METABOLIC PANEL WITH GFR  6. Diabetes - glimepiride (AMARYL) 4 MG tablet; Take 1 tablet (4 mg total) by mouth daily before breakfast.  Dispense: 30 tablet; Refill: 3 -discussed importance of taking all medications as prescribed - POCT glycosylated hemoglobin (Hb A1C)  7. Essential hypertension, benign - olmesartan (BENICAR) 20 MG tablet; Take 1 tablet (20 mg total) by mouth daily.  Dispense: 30 tablet; Refill: 3  8. PVD (peripheral vascular disease) - clopidogrel (PLAVIX) 75 MG tablet; Take 1  tablet (75 mg total) by mouth daily.  Dispense: 30 tablet; Refill: 3  9. Depression - escitalopram (LEXAPRO) 20 MG tablet; Take 1 tablet (20 mg total) by mouth daily.  Dispense: 30 tablet; Refill: 3 -Continue all current medications Labs pending F/u in 3 months and sooner if symptoms develop Discussed regular exercise and healthy eating -Patient to discuss Xarelto with next INR reading -Patient verbalized understanding -Coralie Keens, FNP-C

## 2012-12-22 ENCOUNTER — Telehealth: Payer: Self-pay | Admitting: General Practice

## 2012-12-22 LAB — THYROID PANEL WITH TSH
T3 Uptake: 32.5 % (ref 22.5–37.0)
T4, Total: 11.5 ug/dL (ref 5.0–12.5)

## 2012-12-22 LAB — COMPLETE METABOLIC PANEL WITH GFR
AST: 16 U/L (ref 0–37)
BUN: 12 mg/dL (ref 6–23)
CO2: 27 mEq/L (ref 19–32)
Calcium: 9.4 mg/dL (ref 8.4–10.5)
Chloride: 103 mEq/L (ref 96–112)
Creat: 0.76 mg/dL (ref 0.50–1.10)
GFR, Est African American: >89 mL/min

## 2012-12-23 LAB — NMR LIPOPROFILE WITH LIPIDS
Cholesterol, Total: 136 mg/dL (ref <200)
HDL Particle Number: 21.9 umol/L — ABNORMAL LOW (ref >=30.5)
Large VLDL-P: 6.6 nmol/L — ABNORMAL HIGH (ref <=2.7)
Small LDL Particle Number: 917 nmol/L — ABNORMAL HIGH (ref <=527)

## 2012-12-23 NOTE — Telephone Encounter (Signed)
Could you find out which medications need to be called in and which pharmacy. Also verify which she pick up for mail in. thx

## 2012-12-30 ENCOUNTER — Telehealth: Payer: Self-pay | Admitting: General Practice

## 2012-12-30 NOTE — Telephone Encounter (Signed)
Please contact patient and have her make appointment with Tammy to have INR checked, which has to be done prior to starting on Xarelto. thx

## 2013-01-02 ENCOUNTER — Other Ambulatory Visit: Payer: Self-pay | Admitting: *Deleted

## 2013-01-02 DIAGNOSIS — E785 Hyperlipidemia, unspecified: Secondary | ICD-10-CM

## 2013-01-02 DIAGNOSIS — F329 Major depressive disorder, single episode, unspecified: Secondary | ICD-10-CM

## 2013-01-02 MED ORDER — ROSUVASTATIN CALCIUM 10 MG PO TABS: 20.0000 mg | ORAL_TABLET | Freq: Every day | ORAL | Status: DC

## 2013-01-02 MED ORDER — ESCITALOPRAM OXALATE 20 MG PO TABS: 20.0000 mg | ORAL_TABLET | Freq: Every day | ORAL | Status: DC

## 2013-01-02 MED ORDER — WARFARIN SODIUM 5 MG PO TABS: 5.0000 mg | ORAL_TABLET | ORAL | Status: DC

## 2013-01-02 MED ORDER — ROSUVASTATIN CALCIUM 20 MG PO TABS: ORAL_TABLET | ORAL | Status: DC

## 2013-01-02 NOTE — Telephone Encounter (Signed)
Spoke with patient and took care of meds. See previous note

## 2013-01-02 NOTE — Telephone Encounter (Signed)
Spoke with patient. Sent in correct amount of meds to mail order pharmacy per [facility] and advised that she needs to make an appt with Tammy to discuss changing to Xarelto. Pt verbalized understanding and will call back for appt

## 2013-04-18 ENCOUNTER — Other Ambulatory Visit: Payer: Self-pay | Admitting: General Practice

## 2013-04-21 ENCOUNTER — Ambulatory Visit (INDEPENDENT_AMBULATORY_CARE_PROVIDER_SITE_OTHER): Payer: 59 | Admitting: General Practice

## 2013-04-21 ENCOUNTER — Encounter: Payer: Self-pay | Admitting: General Practice

## 2013-04-21 VITALS — BP 122/72 | HR 84 | Temp 97.5°F | Wt 254.0 lb

## 2013-04-21 DIAGNOSIS — K219 Gastro-esophageal reflux disease without esophagitis: Secondary | ICD-10-CM

## 2013-04-21 DIAGNOSIS — Z7901 Long term (current) use of anticoagulants: Secondary | ICD-10-CM

## 2013-04-21 DIAGNOSIS — E039 Hypothyroidism, unspecified: Secondary | ICD-10-CM

## 2013-04-21 DIAGNOSIS — E785 Hyperlipidemia, unspecified: Secondary | ICD-10-CM

## 2013-04-21 DIAGNOSIS — F32A Depression, unspecified: Secondary | ICD-10-CM

## 2013-04-21 DIAGNOSIS — L0291 Cutaneous abscess, unspecified: Secondary | ICD-10-CM

## 2013-04-21 DIAGNOSIS — F329 Major depressive disorder, single episode, unspecified: Secondary | ICD-10-CM

## 2013-04-21 DIAGNOSIS — E119 Type 2 diabetes mellitus without complications: Secondary | ICD-10-CM

## 2013-04-21 DIAGNOSIS — I10 Essential (primary) hypertension: Secondary | ICD-10-CM

## 2013-04-21 DIAGNOSIS — Z9229 Personal history of other drug therapy: Secondary | ICD-10-CM

## 2013-04-21 DIAGNOSIS — I739 Peripheral vascular disease, unspecified: Secondary | ICD-10-CM

## 2013-04-21 LAB — POCT CBC
Granulocyte percent: 61.6 %G (ref 37–80)
HCT, POC: 46 % (ref 37.7–47.9)
Hemoglobin: 15.2 g/dL (ref 12.2–16.2)
MCH, POC: 28.5 pg (ref 27–31.2)
MCHC: 33 g/dL (ref 31.8–35.4)
MPV: 8.8 fL (ref 0–99.8)
POC Granulocyte: 7.1 — AB (ref 2–6.9)
POC LYMPH PERCENT: 31.8 %L (ref 10–50)
Platelet Count, POC: 200 10*3/uL (ref 142–424)
RDW, POC: 14.2 %
WBC: 11.5 10*3/uL — AB (ref 4.6–10.2)

## 2013-04-21 LAB — POCT INR: INR: 1.4

## 2013-04-21 LAB — POCT GLYCOSYLATED HEMOGLOBIN (HGB A1C): Hemoglobin A1C: 6.6

## 2013-04-21 MED ORDER — LEVOTHYROXINE SODIUM 100 MCG PO TABS: 100.0000 ug | ORAL_TABLET | Freq: Every day | ORAL | Status: DC

## 2013-04-21 MED ORDER — FUROSEMIDE 20 MG PO TABS: 20.0000 mg | ORAL_TABLET | Freq: Every day | ORAL | Status: DC

## 2013-04-21 MED ORDER — ROSUVASTATIN CALCIUM 20 MG PO TABS: ORAL_TABLET | ORAL | Status: DC

## 2013-04-21 MED ORDER — OLMESARTAN MEDOXOMIL 20 MG PO TABS: 20.0000 mg | ORAL_TABLET | Freq: Every day | ORAL | Status: DC

## 2013-04-21 MED ORDER — ESCITALOPRAM OXALATE 20 MG PO TABS: 20.0000 mg | ORAL_TABLET | Freq: Every day | ORAL | Status: DC

## 2013-04-21 MED ORDER — SULFAMETHOXAZOLE-TMP DS 800-160 MG PO TABS: 1.0000 | ORAL_TABLET | Freq: Two times a day (BID) | ORAL | Status: DC

## 2013-04-21 MED ORDER — DEXLANSOPRAZOLE 60 MG PO CPDR: 60.0000 mg | DELAYED_RELEASE_CAPSULE | Freq: Two times a day (BID) | ORAL | Status: DC

## 2013-04-21 MED ORDER — GLIMEPIRIDE 4 MG PO TABS: 4.0000 mg | ORAL_TABLET | Freq: Every day | ORAL | Status: DC

## 2013-04-21 MED ORDER — CLOPIDOGREL BISULFATE 75 MG PO TABS: 75.0000 mg | ORAL_TABLET | Freq: Once | ORAL | Status: DC

## 2013-04-21 NOTE — Patient Instructions (Signed)

## 2013-04-22 LAB — CMP14+EGFR
ALT: 16 IU/L (ref 0–32)
AST: 16 IU/L (ref 0–40)
CO2: 23 mmol/L (ref 18–29)
Calcium: 9.8 mg/dL (ref 8.7–10.2)
Chloride: 101 mmol/L (ref 97–108)
GFR calc Af Amer: 120 mL/min/{1.73_m2} (ref >59)
GFR calc non Af Amer: 104 mL/min/{1.73_m2} (ref >59)
Glucose: 86 mg/dL (ref 65–99)
Potassium: 4.4 mmol/L (ref 3.5–5.2)
Sodium: 144 mmol/L (ref 134–144)
Total Protein: 7.3 g/dL (ref 6.0–8.5)

## 2013-04-22 LAB — THYROID PANEL WITH TSH
Free Thyroxine Index: 1.7 (ref 1.2–4.9)
T3 Uptake Ratio: 28 % (ref 24–39)
T4, Total: 6.1 ug/dL (ref 4.5–12.0)
TSH: 3.92 u[IU]/mL (ref 0.450–4.500)

## 2013-04-22 LAB — NMR, LIPOPROFILE
Cholesterol: 174 mg/dL (ref <200)
HDL Cholesterol by NMR: 32 mg/dL — ABNORMAL LOW (ref >=40)
LDL Particle Number: 1847 nmol/L — ABNORMAL HIGH (ref <1000)
LDLC SERPL CALC-MCNC: 104 mg/dL — ABNORMAL HIGH (ref <100)
Triglycerides by NMR: 192 mg/dL — ABNORMAL HIGH (ref <150)

## 2013-04-22 NOTE — Progress Notes (Signed)
Subjective:    Patient ID: [name], female    DOB: 1960/07/27, 52 y.o.   MRN: [id number]  HPI Patient presents today for follow up of chronic health conditions. She has hypertension, gerd, depression, hyperlipidemia, hypothyroidism, diabetes, and PVD. She reports also taking coumadin, but denies having INR checked regular. Reports she was placed on coumadin after having fem-pop bypass. She reports she will follow up with vascular surgeon in reference to coumadin. She also reports having a knot on her right lateral chest that is tender and has small amount of drainage. She reports having these tender areas, "boils" in the past.      Review of Systems  Constitutional: Negative for fever and chills.  Respiratory: Negative for chest tightness, shortness of breath and wheezing.   Cardiovascular: Negative for chest pain and palpitations.  Gastrointestinal: Negative for nausea, vomiting, abdominal pain, diarrhea, constipation and blood in stool.  Genitourinary: Negative for dysuria, vaginal discharge and difficulty urinating.  Skin:       Slightly red, tender knot to right side  Neurological: Negative for dizziness, weakness and headaches.       Objective:   Physical Exam  Constitutional: She is oriented to person, place, and time. She appears well-developed and well-nourished.  HENT:  Head: Normocephalic and atraumatic.  Right Ear: External ear normal.  Left Ear: External ear normal.  Nose: Nose normal.  Mouth/Throat: Oropharynx is clear and moist.  Eyes: EOM are normal. Pupils are equal, round, and reactive to light.  Neck: Normal range of motion. Neck supple. No thyromegaly present.  Cardiovascular: Normal rate, regular rhythm and normal heart sounds.   Pulmonary/Chest: Effort normal and breath sounds normal. No respiratory distress. She exhibits no tenderness.  Abdominal: Soft. Bowel sounds are normal. She exhibits no distension. There is no tenderness.   Musculoskeletal: She exhibits no edema and no tenderness.  Lymphadenopathy:    She has no cervical adenopathy.  Neurological: She is alert and oriented to person, place, and time.  Skin: Skin is warm and dry. There is erythema.  Abscess noted to right lateral chest, size of small grape. Negative for drainage. Firm with mild erythema.   Psychiatric: She has a normal mood and affect.    Results for orders placed in visit on 04/21/13  POCT CBC      Result Value Range   WBC 11.5 (*) 4.6 - 10.2 K/uL   Lymph, poc 3.7 (*) 0.6 - 3.4   POC LYMPH PERCENT 31.8  10 - 50 %L   POC Granulocyte 7.1 (*) 2 - 6.9   Granulocyte percent 61.6  37 - 80 %G   RBC 5.3  4.04 - 5.48 M/uL   Hemoglobin 15.2  12.2 - 16.2 g/dL   HCT, POC 28.4  13.2 - 47.9 %   MCV 86.2  80 - 97 fL   MCH, POC 28.5  27 - 31.2 pg   MCHC 33.0  31.8 - 35.4 g/dL   RDW, POC 44.0     Platelet Count, POC 200.0  142 - 424 K/uL   MPV 8.8  0 - 99.8 fL  POCT GLYCOSYLATED HEMOGLOBIN (HGB A1C)      Result Value Range   Hemoglobin A1C 6.6%    POCT INR      Result Value Range   INR 1.4           Assessment & Plan:  1. PVD (peripheral vascular disease)  - clopidogrel (PLAVIX) 75 MG tablet; Take 1 tablet (75 mg  total) by mouth once.  Dispense: 90 tablet; Refill: 1  2. Acid reflux  - dexlansoprazole (DEXILANT) 60 MG capsule; Take 1 capsule (60 mg total) by mouth 2 (two) times daily.  Dispense: 180 capsule; Refill: 1  3. Other and unspecified hyperlipidemia  - rosuvastatin (CRESTOR) 20 MG tablet; Take 1 tablet every day  Dispense: 90 tablet; Refill: 1  4. Essential hypertension, benign  - olmesartan (BENICAR) 20 MG tablet; Take 1 tablet (20 mg total) by mouth daily.  Dispense: 90 tablet; Refill: 1 - POCT CBC - CMP14+EGFR - furosemide (LASIX) 20 MG tablet; Take 1 tablet (20 mg total) by mouth daily.  Dispense: 90 tablet; Refill: 1  5. Depression  - escitalopram (LEXAPRO) 20 MG tablet; Take 1 tablet (20 mg total) by mouth  daily.  Dispense: 90 tablet; Refill: 1  6. Diabetes  - glimepiride (AMARYL) 4 MG tablet; Take 1 tablet (4 mg total) by mouth daily before breakfast.  Dispense: 90 tablet; Refill: 1 - POCT glycosylated hemoglobin (Hb A1C)  7. Hypothyroidism  - Thyroid Panel With TSH - levothyroxine (SYNTHROID, LEVOTHROID) 100 MCG tablet; Take 1 tablet (100 mcg total) by mouth daily before breakfast.  Dispense: 90 tablet; Refill: 1  8. Hyperlipidemia  - NMR, lipoprofile  9. Cellulitis and abscess  - sulfamethoxazole-trimethoprim (BACTRIM DS) 800-160 MG per tablet; Take 1 tablet by mouth 2 (two) times daily.  Dispense: 20 tablet; Refill: 0  10. HX: anticoagulation - POCT INR -discussed importance of having INR monitored while taking coumadin -Patient to follow up with vascular surgeon for coumadin order -Continue all current medications Labs pending F/u in 3 months Discussed exercise and diet  Patient verbalized understanding Coralie Keens, FNP-C

## 2013-04-27 ENCOUNTER — Other Ambulatory Visit: Payer: Self-pay | Admitting: General Practice

## 2013-05-17 ENCOUNTER — Ambulatory Visit (INDEPENDENT_AMBULATORY_CARE_PROVIDER_SITE_OTHER): Payer: 59 | Admitting: Pharmacist

## 2013-05-17 DIAGNOSIS — I739 Peripheral vascular disease, unspecified: Secondary | ICD-10-CM

## 2013-05-17 DIAGNOSIS — K219 Gastro-esophageal reflux disease without esophagitis: Secondary | ICD-10-CM

## 2013-05-17 MED ORDER — WARFARIN SODIUM 5 MG PO TABS: 5.0000 mg | ORAL_TABLET | Freq: Every day | ORAL | Status: DC

## 2013-05-17 MED ORDER — DEXLANSOPRAZOLE 60 MG PO CPDR: 60.0000 mg | DELAYED_RELEASE_CAPSULE | Freq: Every day | ORAL | Status: DC

## 2013-05-17 NOTE — Patient Instructions (Signed)
Anticoagulation Dose Instructions as of 05/17/2013     [name] Tue Wed Thu Fri Sat   New Dose 5 mg 7.5 mg 5 mg 7.5 mg 5 mg 7.5 mg 5 mg    Description       While taking Bactrim DS / Septra DS take 1 tablet daily, then restart 1 and 1/2 tablet Mondays, Wednesdays and Fridays and 1 tablet all other days.      INR was 1.6 today

## 2013-06-19 ENCOUNTER — Encounter: Payer: Self-pay | Admitting: Family Medicine

## 2013-06-19 ENCOUNTER — Encounter (INDEPENDENT_AMBULATORY_CARE_PROVIDER_SITE_OTHER): Payer: Self-pay

## 2013-06-19 ENCOUNTER — Ambulatory Visit (INDEPENDENT_AMBULATORY_CARE_PROVIDER_SITE_OTHER): Payer: 59 | Admitting: Pharmacist

## 2013-06-19 ENCOUNTER — Ambulatory Visit (INDEPENDENT_AMBULATORY_CARE_PROVIDER_SITE_OTHER): Payer: 59 | Admitting: Family Medicine

## 2013-06-19 ENCOUNTER — Ambulatory Visit (INDEPENDENT_AMBULATORY_CARE_PROVIDER_SITE_OTHER): Payer: 59

## 2013-06-19 VITALS — BP 114/66 | HR 83 | Temp 97.3°F | Ht 68.0 in | Wt 258.0 lb

## 2013-06-19 DIAGNOSIS — L0390 Cellulitis, unspecified: Secondary | ICD-10-CM

## 2013-06-19 DIAGNOSIS — R059 Cough, unspecified: Secondary | ICD-10-CM

## 2013-06-19 DIAGNOSIS — R05 Cough: Secondary | ICD-10-CM

## 2013-06-19 DIAGNOSIS — L0291 Cutaneous abscess, unspecified: Secondary | ICD-10-CM

## 2013-06-19 DIAGNOSIS — I739 Peripheral vascular disease, unspecified: Secondary | ICD-10-CM

## 2013-06-19 DIAGNOSIS — J209 Acute bronchitis, unspecified: Secondary | ICD-10-CM

## 2013-06-19 LAB — POCT INR: INR: 2.4

## 2013-06-19 MED ORDER — LEVOFLOXACIN 500 MG PO TABS
500.0000 mg | ORAL_TABLET | Freq: Every day | ORAL | Status: DC
Start: 2013-06-19 — End: 2013-07-28

## 2013-06-19 MED ORDER — ALBUTEROL SULFATE HFA 108 (90 BASE) MCG/ACT IN AERS: 2.0000 | INHALATION_SPRAY | Freq: Four times a day (QID) | RESPIRATORY_TRACT | Status: DC | PRN

## 2013-06-19 NOTE — Patient Instructions (Signed)
Use a cool mist humidifier in her bedroom at nighttime Drink plenty of fluids Take Mucinex maximum strength blue and white in color, over-the-counter one twice daily with a large glass of water for cough and congestion Take antibiotic as directed Use albuterol inhaler as needed Recheck in 7-8 day or sooner if necessary

## 2013-06-19 NOTE — Patient Instructions (Signed)
Anticoagulation Dose Instructions as of 06/19/2013     [name] Tue Wed Thu Fri Sat   New Dose 5 mg 7.5 mg 5 mg 7.5 mg 5 mg 7.5 mg 5 mg    Description       Continue 1 and 1/2 tablet Mondays, Wednesdays and Fridays and 1 tablet all other days.      INR was 2.4 today

## 2013-06-19 NOTE — Progress Notes (Signed)
Subjective:    Patient ID: [name], female    DOB: [birth year], 53 y.o.   MRN: [id number]  HPI Patient here today for cough and congestion and sore under right arm. Patient also complains of diarrhea. Patient has multiple sores on her axilla back and arms .       Patient Active Problem List   Diagnosis Date Noted  . Peripheral vascular occlusive disease 05/17/2013  . GERD (gastroesophageal reflux disease) 10/18/2012  . Hernia, hiatal 10/18/2012  . DIABETES MELLITUS 04/18/2008  . HYPERLIPIDEMIA 04/18/2008  . OBESITY 04/18/2008  . SMOKER 04/18/2008  . HYPERTENSION 04/18/2008  . PERIPHERAL VASCULAR DISEASE 04/18/2008   Outpatient Encounter Prescriptions as of 06/19/2013  Medication Sig  . clopidogrel (PLAVIX) 75 MG tablet Take 1 tablet (75 mg total) by mouth once.  Marland Kitchen dexlansoprazole (DEXILANT) 60 MG capsule Take 1 capsule (60 mg total) by mouth daily.  Marland Kitchen escitalopram (LEXAPRO) 20 MG tablet Take 1 tablet (20 mg total) by mouth daily.  . furosemide (LASIX) 20 MG tablet Take 1 tablet (20 mg total) by mouth daily.  Marland Kitchen levothyroxine (SYNTHROID, LEVOTHROID) 100 MCG tablet Take 1 tablet (100 mcg total) by mouth daily before breakfast.  . olmesartan (BENICAR) 20 MG tablet Take 1 tablet (20 mg total) by mouth daily.  . Potassium Gluconate 595 MG CAPS Take 1 capsule by mouth daily.  . rosuvastatin (CRESTOR) 20 MG tablet Take 1 tablet every day  . warfarin (COUMADIN) 5 MG tablet Take 1 tablet (5 mg total) by mouth daily.  . [DISCONTINUED] aspirin 325 MG tablet Take 325 mg by mouth daily.  . [DISCONTINUED] Cholecalciferol (VITAMIN D) 1000 UNITS capsule Take 5,000 Units by mouth daily.  . [DISCONTINUED] glimepiride (AMARYL) 4 MG tablet Take 1 tablet (4 mg total) by mouth daily before breakfast.  . [DISCONTINUED] metFORMIN (GLUCOPHAGE) 1000 MG tablet Take 1,000 mg by mouth 2 (two) times daily with a meal.  . [DISCONTINUED] sulfamethoxazole-trimethoprim (BACTRIM DS) 800-160 MG per  tablet Take 1 tablet by mouth 2 (two) times daily.    Review of Systems  Constitutional: Negative.   HENT: Positive for congestion and postnasal drip.   Eyes: Negative.   Respiratory: Positive for cough and wheezing.   Cardiovascular: Negative.   Gastrointestinal: Positive for diarrhea.  Endocrine: Negative.   Genitourinary: Negative.   Musculoskeletal: Negative.   Skin: Negative.   Allergic/Immunologic: Negative.   Neurological: Negative.   Hematological: Negative.   Psychiatric/Behavioral: Negative.        Objective:   Physical Exam  Nursing note and vitals reviewed. Constitutional: She is oriented to person, place, and time. She appears well-developed and well-nourished. No distress.  HENT:  Head: Normocephalic and atraumatic.  Right Ear: External ear normal.  Left Ear: External ear normal.  Mouth/Throat: Oropharynx is clear and moist. No oropharyngeal exudate.  Nasal congestion bilaterally  Eyes: Conjunctivae and EOM are normal. Pupils are equal, round, and reactive to light. Right eye exhibits no discharge. Left eye exhibits no discharge. No scleral icterus.  Neck: Normal range of motion. Neck supple. No JVD present. No thyromegaly present.  Cardiovascular: Normal rate, regular rhythm, normal heart sounds and intact distal pulses.  Exam reveals no gallop and no friction rub.   No murmur heard. At 72 per minute  Pulmonary/Chest: Effort normal and breath sounds normal. No respiratory distress. She has no wheezes. She has no rales. She exhibits no tenderness.  Tight congested cough  Abdominal: Soft. Bowel sounds are normal.  Musculoskeletal: Normal range  of motion. She exhibits no edema.  Lymphadenopathy:    She has no cervical adenopathy.  Neurological: She is alert and oriented to person, place, and time.  Skin: Skin is warm and dry. Rash noted.  Multiple areas of excoriation dry patchy and papular  Psychiatric: She has a normal mood and affect. Her behavior is  normal. Judgment and thought content normal.   BP 114/66  Pulse 83  Temp(Src) 97.3 F (36.3 C) (Oral)  Ht 5\' 8"  (1.727 m)  Wt 258 lb (117.028 kg)  BMI 39.24 kg/m2  [facility] reading (PRIMARY) by  Dr.Moore;-chest x-ray, degenerative changes in the spine and increased bronchial markings                                Results for orders placed in visit on 06/19/13  POCT INR      Result Value Range   INR 2.4            Assessment & Plan:  1. Cough - DG Chest 2 View; Future - levofloxacin (LEVAQUIN) 500 MG tablet; Take 1 tablet (500 mg total) by mouth daily.  Dispense: 7 tablet; Refill: 0 - albuterol (PROVENTIL HFA;VENTOLIN HFA) 108 (90 BASE) MCG/ACT inhaler; Inhale 2 puffs into the lungs every 6 (six) hours as needed for wheezing or shortness of breath.  Dispense: 1 Inhaler; Refill: 2  2. Cellulitis - levofloxacin (LEVAQUIN) 500 MG tablet; Take 1 tablet (500 mg total) by mouth daily.  Dispense: 7 tablet; Refill: 0 - albuterol (PROVENTIL HFA;VENTOLIN HFA) 108 (90 BASE) MCG/ACT inhaler; Inhale 2 puffs into the lungs every 6 (six) hours as needed for wheezing or shortness of breath.  Dispense: 1 Inhaler; Refill: 2  3. Acute bronchitis - levofloxacin (LEVAQUIN) 500 MG tablet; Take 1 tablet (500 mg total) by mouth daily.  Dispense: 7 tablet; Refill: 0 - albuterol (PROVENTIL HFA;VENTOLIN HFA) 108 (90 BASE) MCG/ACT inhaler; Inhale 2 puffs into the lungs every 6 (six) hours as needed for wheezing or shortness of breath.  Dispense: 1 Inhaler; Refill: 2 Patient Instructions  Use a cool mist humidifier in her bedroom at nighttime Drink plenty of fluids Take Mucinex maximum strength blue and white in color, over-the-counter one twice daily with a large glass of water for cough and congestion Take antibiotic as directed Use albuterol inhaler as needed Recheck in 7-8 day or sooner if necessary   Arrie Senate MD

## 2013-06-19 NOTE — Progress Notes (Signed)
Patient triaged to Dr Laurance Flatten for cough and congestion

## 2013-06-20 ENCOUNTER — Ambulatory Visit: Payer: 59

## 2013-06-28 ENCOUNTER — Ambulatory Visit: Payer: 59 | Admitting: Family Medicine

## 2013-07-28 ENCOUNTER — Ambulatory Visit (INDEPENDENT_AMBULATORY_CARE_PROVIDER_SITE_OTHER): Payer: 59 | Admitting: Family Medicine

## 2013-07-28 ENCOUNTER — Encounter: Payer: Self-pay | Admitting: Family Medicine

## 2013-07-28 VITALS — BP 140/85 | HR 77 | Temp 97.6°F | Ht 68.0 in | Wt 261.0 lb

## 2013-07-28 DIAGNOSIS — C55 Malignant neoplasm of uterus, part unspecified: Secondary | ICD-10-CM | POA: Insufficient documentation

## 2013-07-28 DIAGNOSIS — E669 Obesity, unspecified: Secondary | ICD-10-CM

## 2013-07-28 DIAGNOSIS — F17200 Nicotine dependence, unspecified, uncomplicated: Secondary | ICD-10-CM

## 2013-07-28 DIAGNOSIS — E119 Type 2 diabetes mellitus without complications: Secondary | ICD-10-CM

## 2013-07-28 DIAGNOSIS — L0100 Impetigo, unspecified: Secondary | ICD-10-CM | POA: Insufficient documentation

## 2013-07-28 DIAGNOSIS — J449 Chronic obstructive pulmonary disease, unspecified: Secondary | ICD-10-CM | POA: Insufficient documentation

## 2013-07-28 DIAGNOSIS — I739 Peripheral vascular disease, unspecified: Secondary | ICD-10-CM

## 2013-07-28 DIAGNOSIS — I10 Essential (primary) hypertension: Secondary | ICD-10-CM

## 2013-07-28 DIAGNOSIS — K219 Gastro-esophageal reflux disease without esophagitis: Secondary | ICD-10-CM

## 2013-07-28 DIAGNOSIS — E785 Hyperlipidemia, unspecified: Secondary | ICD-10-CM

## 2013-07-28 DIAGNOSIS — J209 Acute bronchitis, unspecified: Secondary | ICD-10-CM | POA: Insufficient documentation

## 2013-07-28 MED ORDER — BUDESONIDE-FORMOTEROL FUMARATE 160-4.5 MCG/ACT IN AERO: 2.0000 | INHALATION_SPRAY | Freq: Two times a day (BID) | RESPIRATORY_TRACT | Status: DC

## 2013-07-28 MED ORDER — CEPHALEXIN 500 MG PO CAPS: 500.0000 mg | ORAL_CAPSULE | Freq: Four times a day (QID) | ORAL | Status: DC

## 2013-07-28 MED ORDER — MUPIROCIN 2 % EX OINT: 1.0000 "application " | TOPICAL_OINTMENT | Freq: Two times a day (BID) | CUTANEOUS | Status: DC

## 2013-07-28 NOTE — Progress Notes (Signed)
Patient ID: [name], female   DOB: [birth year], 53 y.o.   MRN: [id number] SUBJECTIVE: CC: Chief Complaint  Patient presents with  . Cough  . head and chest congestion    HPI: Acute Bronchitis Patient presents for evaluation of productive cough. Symptoms began 2 weeks ago and are gradually worsening since that time. Past history is significant for smoking and episodes of bronchitis. She has other medical problems as listed in the History in EPIC. She continues to smoke because she loves it and has no plans to stop.  Her sugars are fluctuating. She has chronic vascular problems in the right leg. And had an injury subsequent to the fem-pop surgery. The vascular surgeon has had her on soumadin to keep the flow patent and it seems to have worked. .  Past Medical History  Diagnosis Date  . DM (diabetes mellitus)   . Hypertension   . Hypothyroid   . Hyperlipemia   . DVT (deep venous thrombosis)     x5  . GERD (gastroesophageal reflux disease)   . UTI (urinary tract infection)   . Family history of colon cancer   . PVD (peripheral vascular disease)   . Depression   . Obesity   . COPD (chronic obstructive pulmonary disease)   . Uterine cancer    Past Surgical History  Procedure Laterality Date  . Abdominal hysterectomy    . Toe amputation      right  . Lumbar disc surgery      L4-L5  . Femoral bypass      x 5  . Cesarean section    . Tonsillectomy     History   Social History  . Marital Status: Married    Spouse Name: N/A    Number of Children: 1  . Years of Education: N/A   Occupational History  . teacher    Social History Main Topics  . Smoking status: Current Every Day Smoker -- 0.50 packs/day for 38 years    Types: Cigarettes  . Smokeless tobacco: Never Used  . Alcohol Use: No  . Drug Use: No  . Sexual Activity: Not on file   Other Topics Concern  . Not on file   Social History Narrative  . No narrative on file   Family History  Problem  Relation Age of Onset  . Colon cancer Brother 72  . Hypertension Mother   . Hypothyroidism Mother   . Heart disease Father   . Diabetes Father   . Hyperlipidemia Father   . Clotting disorder Brother   . Diabetes Brother   . Heart disease Brother    Current Outpatient Prescriptions on File Prior to Visit  Medication Sig Dispense Refill  . clopidogrel (PLAVIX) 75 MG tablet Take 1 tablet (75 mg total) by mouth once.  90 tablet  1  . dexlansoprazole (DEXILANT) 60 MG capsule Take 1 capsule (60 mg total) by mouth daily.  30 capsule  2  . escitalopram (LEXAPRO) 20 MG tablet Take 1 tablet (20 mg total) by mouth daily.  90 tablet  1  . furosemide (LASIX) 20 MG tablet Take 1 tablet (20 mg total) by mouth daily.  90 tablet  1  . levothyroxine (SYNTHROID, LEVOTHROID) 100 MCG tablet Take 1 tablet (100 mcg total) by mouth daily before breakfast.  90 tablet  1  . olmesartan (BENICAR) 20 MG tablet Take 1 tablet (20 mg total) by mouth daily.  90 tablet  1  . Potassium Gluconate 595 MG CAPS  Take 1 capsule by mouth daily.      . rosuvastatin (CRESTOR) 20 MG tablet Take 1 tablet every day  90 tablet  1  . warfarin (COUMADIN) 5 MG tablet Take 1 tablet (5 mg total) by mouth daily.  90 tablet  1  . albuterol (PROVENTIL HFA;VENTOLIN HFA) 108 (90 BASE) MCG/ACT inhaler Inhale 2 puffs into the lungs every 6 (six) hours as needed for wheezing or shortness of breath.  1 Inhaler  2   No current facility-administered medications on file prior to visit.   Allergies  Allergen Reactions  . Cortisone   . Prednisone     Makes internal organs swell   Immunization History  Administered Date(s) Administered  . Td 06/08/2005   Prior to Admission medications   Medication Sig Start Date End Date Taking? Authorizing Provider  clopidogrel (PLAVIX) 75 MG tablet Take 1 tablet (75 mg total) by mouth once. 04/21/13  Yes Mae Loree Fee, FNP  dexlansoprazole (DEXILANT) 60 MG capsule Take 1 capsule (60 mg total) by mouth  daily. 05/17/13  Yes Mae Loree Fee, FNP  escitalopram (LEXAPRO) 20 MG tablet Take 1 tablet (20 mg total) by mouth daily. 04/21/13  Yes Mae Loree Fee, FNP  furosemide (LASIX) 20 MG tablet Take 1 tablet (20 mg total) by mouth daily. 04/21/13  Yes Mae Loree Fee, FNP  levothyroxine (SYNTHROID, LEVOTHROID) 100 MCG tablet Take 1 tablet (100 mcg total) by mouth daily before breakfast. 04/21/13  Yes Mae Loree Fee, FNP  olmesartan (BENICAR) 20 MG tablet Take 1 tablet (20 mg total) by mouth daily. 04/21/13  Yes Mae E Haliburton, FNP  Potassium Gluconate 595 MG CAPS Take 1 capsule by mouth daily.   Yes Historical Provider, MD  rosuvastatin (CRESTOR) 20 MG tablet Take 1 tablet every day 04/21/13  Yes Mae Loree Fee, FNP  warfarin (COUMADIN) 5 MG tablet Take 1 tablet (5 mg total) by mouth daily. 05/17/13  Yes Tammy Eckard, PHARMD  albuterol (PROVENTIL HFA;VENTOLIN HFA) 108 (90 BASE) MCG/ACT inhaler Inhale 2 puffs into the lungs every 6 (six) hours as needed for wheezing or shortness of breath. 06/19/13   Chipper Herb, MD     ROS: As above in the HPI. All other systems are stable or negative.  OBJECTIVE: APPEARANCE:  Patient in no acute distress.The patient appeared well nourished and normally developed. Acyanotic. Waist: VITAL SIGNS:BP 140/85  Pulse 77  Temp(Src) 97.6 F (36.4 C) (Oral)  Ht 5\' 8"  (1.727 m)  Wt 261 lb (118.389 kg)  BMI 39.69 kg/m2  Obese WF with a wheezy cough  SKIN: warm and  Dry with ulcerated skin areas where she has picked them. There are sores. And there is a submental lymph node as well related to the sore under the chin. Multiple areas of the sores on arms and shoulders.  HEAD and Neck: without JVD, Head and scalp: normal Eyes:No scleral icterus. Fundi normal, eye movements normal. Ears: Auricle normal, canal normal, Tympanic membranes normal, insufflation normal. Nose: normal Throat: normal Neck & thyroid: normal  CHEST & LUNGS: Chest wall:  normal Lungs: Coarse breath sounds and wheezes. No distress. No Rales.  CVS: Reveals the PMI to be normally located. Regular rhythm, First and Second Heart sounds are normal,  absence of murmurs, rubs or gallops. Peripheral vasculature: Radial pulses: normal Dorsal pedis pulses: normal Posterior pulses: normal  ABDOMEN:  Appearance: Obese Benign, no organomegaly, no masses, no Abdominal Aortic enlargement. No Guarding , no rebound. No Bruits. Bowel sounds: normal  RECTAL: N/A GU: N/A  EXTREMETIES: nonedematous.  NEUROLOGIC: oriented to time,place and person; nonfocal. Strength is normal Sensory is normal Reflexes are normal Cranial Nerves are normal.  ASSESSMENT:  SMOKER  Peripheral vascular occlusive disease  OBESITY  HYPERTENSION  HYPERLIPIDEMIA  DIABETES MELLITUS  GERD (gastroesophageal reflux disease)  Impetigo  Acute bronchitis Psychologic related skin picking.   PLAN: Smoking dessation in the AVS and counselled for 7 minutes.   No orders of the defined types were placed in this encounter.   Meds ordered this encounter  Medications  . cephALEXin (KEFLEX) 500 MG capsule    Sig: Take 1 capsule (500 mg total) by mouth 4 (four) times daily.    Dispense:  40 capsule    Refill:  0  . budesonide-formoterol (SYMBICORT) 160-4.5 MCG/ACT inhaler    Sig: Inhale 2 puffs into the lungs 2 (two) times daily.    Dispense:  2 Inhaler    Refill:  0  . mupirocin ointment (BACTROBAN) 2 %    Sig: Place 1 application into the nose 2 (two) times daily.    Dispense:  22 g    Refill:  0   Medications Discontinued During This Encounter  Medication Reason  . levofloxacin (LEVAQUIN) 500 MG tablet   skin care. Samples of symbicort given Use the albuterol as a rescue. If she is better in 2 weeks consider PFTs.  Return in about 2 weeks (around 08/11/2013) for recheck wheezing with Mae.Dub Mikes P. Jacelyn Grip, M.D.

## 2013-07-28 NOTE — Patient Instructions (Signed)
Smoking Cessation Quitting smoking is important to your health and has many advantages. However, it is not always easy to quit since nicotine is a very addictive drug. Often times, people try 3 times or more before being able to quit. This document explains the best ways for you to prepare to quit smoking. Quitting takes hard work and a lot of effort, but you can do it. ADVANTAGES OF QUITTING SMOKING  You will live longer, feel better, and live better.  Your body will feel the impact of quitting smoking almost immediately.  Within 20 minutes, blood pressure decreases. Your pulse returns to its normal level.  After 8 hours, carbon monoxide levels in the blood return to normal. Your oxygen level increases.  After 24 hours, the chance of having a heart attack starts to decrease. Your breath, hair, and body stop smelling like smoke.  After 48 hours, damaged nerve endings begin to recover. Your sense of taste and smell improve.  After 72 hours, the body is virtually free of nicotine. Your bronchial tubes relax and breathing becomes easier.  After 2 to 12 weeks, lungs can hold more air. Exercise becomes easier and circulation improves.  The risk of having a heart attack, stroke, cancer, or lung disease is greatly reduced.  After 1 year, the risk of coronary heart disease is cut in half.  After 5 years, the risk of stroke falls to the same as a nonsmoker.  After 10 years, the risk of lung cancer is cut in half and the risk of other cancers decreases significantly.  After 15 years, the risk of coronary heart disease drops, usually to the level of a nonsmoker.  If you are pregnant, quitting smoking will improve your chances of having a healthy baby.  The people you live with, especially any children, will be healthier.  You will have extra money to spend on things other than cigarettes. QUESTIONS TO THINK ABOUT BEFORE ATTEMPTING TO QUIT You may want to talk about your answers with your  caregiver.  Why do you want to quit?  If you tried to quit in the past, what helped and what did not?  What will be the most difficult situations for you after you quit? How will you plan to handle them?  Who can help you through the tough times? Your family? Friends? A caregiver?  What pleasures do you get from smoking? What ways can you still get pleasure if you quit? Here are some questions to ask your caregiver:  How can you help me to be successful at quitting?  What medicine do you think would be best for me and how should I take it?  What should I do if I need more help?  What is smoking withdrawal like? How can I get information on withdrawal? GET READY  Set a quit date.  Change your environment by getting rid of all cigarettes, ashtrays, matches, and lighters in your home, car, or work. Do not let people smoke in your home.  Review your past attempts to quit. Think about what worked and what did not. GET SUPPORT AND ENCOURAGEMENT You have a better chance of being successful if you have help. You can get support in many ways.  Tell your family, friends, and co-workers that you are going to quit and need their support. Ask them not to smoke around you.  Get individual, group, or telephone counseling and support. Programs are available at local hospitals and health centers. Call your local health department for   information about programs in your area.  Spiritual beliefs and practices may help some smokers quit.  Download a "quit meter" on your computer to keep track of quit statistics, such as how long you have gone without smoking, cigarettes not smoked, and money saved.  Get a self-help book about quitting smoking and staying off of tobacco. LEARN NEW SKILLS AND BEHAVIORS  Distract yourself from urges to smoke. Talk to someone, go for a walk, or occupy your time with a task.  Change your normal routine. Take a different route to work. Drink tea instead of coffee.  Eat breakfast in a different place.  Reduce your stress. Take a hot bath, exercise, or read a book.  Plan something enjoyable to do every day. Reward yourself for not smoking.  Explore interactive web-based programs that specialize in helping you quit. GET MEDICINE AND USE IT CORRECTLY Medicines can help you stop smoking and decrease the urge to smoke. Combining medicine with the above behavioral methods and support can greatly increase your chances of successfully quitting smoking.  Nicotine replacement therapy helps deliver nicotine to your body without the negative effects and risks of smoking. Nicotine replacement therapy includes nicotine gum, lozenges, inhalers, nasal sprays, and skin patches. Some may be available over-the-counter and others require a prescription.  Antidepressant medicine helps people abstain from smoking, but how this works is unknown. This medicine is available by prescription.  Nicotinic receptor partial agonist medicine simulates the effect of nicotine in your brain. This medicine is available by prescription. Ask your caregiver for advice about which medicines to use and how to use them based on your health history. Your caregiver will tell you what side effects to look out for if you choose to be on a medicine or therapy. Carefully read the information on the package. Do not use any other product containing nicotine while using a nicotine replacement product.  RELAPSE OR DIFFICULT SITUATIONS Most relapses occur within the first 3 months after quitting. Do not be discouraged if you start smoking again. Remember, most people try several times before finally quitting. You may have symptoms of withdrawal because your body is used to nicotine. You may crave cigarettes, be irritable, feel very hungry, cough often, get headaches, or have difficulty concentrating. The withdrawal symptoms are only temporary. They are strongest when you first quit, but they will go away within  10 14 days. To reduce the chances of relapse, try to:  Avoid drinking alcohol. Drinking lowers your chances of successfully quitting.  Reduce the amount of caffeine you consume. Once you quit smoking, the amount of caffeine in your body increases and can give you symptoms, such as a rapid heartbeat, sweating, and anxiety.  Avoid smokers because they can make you want to smoke.  Do not let weight gain distract you. Many smokers will gain weight when they quit, usually less than 10 pounds. Eat a healthy diet and stay active. You can always lose the weight gained after you quit.  Find ways to improve your mood other than smoking. FOR MORE INFORMATION  www.smokefree.gov  Document Released: 05/19/2001 Document Revised: 11/24/2011 Document Reviewed: 09/03/2011 [employer] Patient Information 2014 [employer].  

## 2013-08-01 ENCOUNTER — Other Ambulatory Visit: Payer: Self-pay | Admitting: Family Medicine

## 2013-08-03 ENCOUNTER — Other Ambulatory Visit: Payer: Self-pay | Admitting: *Deleted

## 2013-08-03 ENCOUNTER — Telehealth: Payer: Self-pay | Admitting: *Deleted

## 2013-08-03 DIAGNOSIS — E039 Hypothyroidism, unspecified: Secondary | ICD-10-CM

## 2013-08-03 MED ORDER — LEVOTHYROXINE SODIUM 100 MCG PO TABS: 100.0000 ug | ORAL_TABLET | Freq: Every day | ORAL | Status: DC

## 2013-08-03 NOTE — Telephone Encounter (Signed)
We received fax from [facility] requesting refill for Warfarin 5mg . Pt told them she is taking 1 and 1/2 tablet daily. I reviewed last Protime visit and the directions are different. Can you review and send in correctly to [facility].  Also received fax for Escitalopram 10mg . Last strength in Epic is for 20mg ? Refill on Clopidogrel 75mg  daily needed.

## 2013-08-04 ENCOUNTER — Other Ambulatory Visit: Payer: Self-pay | Admitting: Nurse Practitioner

## 2013-08-04 DIAGNOSIS — I739 Peripheral vascular disease, unspecified: Secondary | ICD-10-CM

## 2013-08-04 MED ORDER — WARFARIN SODIUM 5 MG PO TABS: ORAL_TABLET | ORAL | Status: DC

## 2013-08-04 NOTE — Telephone Encounter (Signed)
Is she getting these from mail order or [facility]- ask parient about dose of lexapro 10 or 20?

## 2013-08-14 ENCOUNTER — Ambulatory Visit: Payer: 59 | Admitting: General Practice

## 2013-08-14 NOTE — Telephone Encounter (Signed)
Line busy

## 2013-08-16 ENCOUNTER — Ambulatory Visit (INDEPENDENT_AMBULATORY_CARE_PROVIDER_SITE_OTHER): Payer: 59 | Admitting: Family Medicine

## 2013-08-16 VITALS — BP 109/71 | HR 69 | Temp 96.8°F | Resp 20 | Ht 70.0 in | Wt 260.4 lb

## 2013-08-16 DIAGNOSIS — I739 Peripheral vascular disease, unspecified: Secondary | ICD-10-CM

## 2013-08-16 DIAGNOSIS — E669 Obesity, unspecified: Secondary | ICD-10-CM | POA: Insufficient documentation

## 2013-08-16 DIAGNOSIS — E119 Type 2 diabetes mellitus without complications: Secondary | ICD-10-CM

## 2013-08-16 LAB — POCT GLYCOSYLATED HEMOGLOBIN (HGB A1C): Hemoglobin A1C: 9.7

## 2013-08-16 LAB — POCT INR: INR: 2

## 2013-08-16 LAB — GLUCOSE, POCT (MANUAL RESULT ENTRY): POC Glucose: 294 mg/dl — AB (ref 70–99)

## 2013-08-16 NOTE — Patient Instructions (Addendum)
Continue current medications. Continue good therapeutic lifestyle changes which include good diet and exercise. Fall precautions discussed with patient. Follow up with Mae and schedule a visit with clinical pharm. For diabetic management.   Monitor blood sugars as demonstrated on blood sugar flow sheet keep appointment with Tammy in 3-4 weeks and bring these blood sugar readings and to that visit Exercise as much is possible tried to stop smoking, drink plenty of water and fluids Make all efforts possible especially to stop smoking   Take januvia 100 mg one daily

## 2013-08-16 NOTE — Progress Notes (Signed)
Subjective:    Patient ID: [name], female    DOB: 1960-12-04, 53 y.o.   MRN: [id number]  HPI Patient here today for elevated blood sugar levels. The patient had a blood sugar of 400 this morning. It is already 290 out. She has had trouble managing her blood sugars and finding medication that she can tolerate. She is tried metformin but this caused a lot of abdominal pain. She is also trigona peroxide and this caused her blood sugars to drop too low. She is on a lot of different medication and one of these includes warfarin. She is taking this because of a history of arterial blockage in one of her legs.        Patient Active Problem List   Diagnosis Date Noted  . Acute bronchitis 07/28/2013  . Impetigo 07/28/2013  . COPD (chronic obstructive pulmonary disease)   . Uterine cancer   . Peripheral vascular occlusive disease 05/17/2013  . GERD (gastroesophageal reflux disease) 10/18/2012  . Hernia, hiatal 10/18/2012  . DIABETES MELLITUS 04/18/2008  . HYPERLIPIDEMIA 04/18/2008  . OBESITY 04/18/2008  . SMOKER 04/18/2008  . HYPERTENSION 04/18/2008  . PERIPHERAL VASCULAR DISEASE 04/18/2008   Outpatient Encounter Prescriptions as of 08/16/2013  Medication Sig  . clopidogrel (PLAVIX) 75 MG tablet Take 1 tablet (75 mg total) by mouth once.  Marland Kitchen dexlansoprazole (DEXILANT) 60 MG capsule Take 1 capsule (60 mg total) by mouth daily.  Marland Kitchen escitalopram (LEXAPRO) 20 MG tablet Take 1 tablet (20 mg total) by mouth daily.  . furosemide (LASIX) 20 MG tablet Take 1 tablet (20 mg total) by mouth daily.  . furosemide (LASIX) 20 MG tablet TAKE 1 TABLET DAILY  . levothyroxine (SYNTHROID, LEVOTHROID) 100 MCG tablet Take 1 tablet (100 mcg total) by mouth daily before breakfast.  . mupirocin ointment (BACTROBAN) 2 % Place 1 application into the nose 2 (two) times daily.  Marland Kitchen olmesartan (BENICAR) 20 MG tablet Take 1 tablet (20 mg total) by mouth daily.  . Potassium Gluconate 595 MG CAPS Take 1  capsule by mouth daily.  . rosuvastatin (CRESTOR) 20 MG tablet Take 1 tablet every day  . warfarin (COUMADIN) 5 MG tablet Up to 1 1/2 tablets daily  . albuterol (PROVENTIL HFA;VENTOLIN HFA) 108 (90 BASE) MCG/ACT inhaler Inhale 2 puffs into the lungs every 6 (six) hours as needed for wheezing or shortness of breath.  . [DISCONTINUED] budesonide-formoterol (SYMBICORT) 160-4.5 MCG/ACT inhaler Inhale 2 puffs into the lungs 2 (two) times daily.  . [DISCONTINUED] cephALEXin (KEFLEX) 500 MG capsule Take 1 capsule (500 mg total) by mouth 4 (four) times daily.    Review of Systems  Constitutional: Negative.   HENT: Negative.   Eyes: Negative.   Respiratory: Negative.   Cardiovascular: Negative.   Gastrointestinal: Negative.   Endocrine: Negative.        Elevated blood sugar levels  Genitourinary: Negative.   Musculoskeletal: Negative.   Skin: Negative.   Allergic/Immunologic: Negative.   Neurological: Negative.   Hematological: Negative.   Psychiatric/Behavioral: Negative.        Objective:   Physical Exam  Nursing note and vitals reviewed. Constitutional: She is oriented to person, place, and time. She appears well-developed and well-nourished.  Obesity  HENT:  Head: Normocephalic and atraumatic.  Right Ear: External ear normal.  Left Ear: External ear normal.  Nose: Nose normal.  Mouth/Throat: Oropharynx is clear and moist.  Eyes: Conjunctivae and EOM are normal. Pupils are equal, round, and reactive to light. Right eye exhibits  no discharge. Left eye exhibits no discharge. No scleral icterus.  Neck: Normal range of motion. Neck supple. No thyromegaly present.  Cardiovascular: Normal rate, regular rhythm, normal heart sounds and intact distal pulses.   No murmur heard. At 72 per minute  Pulmonary/Chest: Effort normal and breath sounds normal. She has no wheezes. She has no rales.  Abdominal: Soft. Bowel sounds are normal. She exhibits no mass. There is no tenderness. There is no  rebound and no guarding.  Musculoskeletal: Normal range of motion. She exhibits no edema and no tenderness.  Lymphadenopathy:    She has no cervical adenopathy.  Neurological: She is alert and oriented to person, place, and time. She has normal reflexes. No cranial nerve deficit.  Skin: Skin is warm and dry.  Psychiatric: She has a normal mood and affect. Her behavior is normal. Judgment and thought content normal.   BP 109/71  Pulse 69  Temp(Src) 96.8 F (36 C) (Oral)  Resp 20  Ht _0  (1.778 m)  Wt 260 lb 6.4 oz (118.117 kg)  BMI 37.36 kg/m2        Assessment & Plan:   1. Peripheral vascular occlusive disease - POCT INR - POCT glucose (manual entry) - POCT glycosylated hemoglobin (Hb A1C) - BMP8+EGFR - Hepatic function panel  2. Diabetes - POCT glycosylated hemoglobin (Hb A1C) - BMP8+EGFR - Hepatic function panel  3. High risk medication use  4. Obesity  5. Nicotine abuse  Patient Instructions  Continue current medications. Continue good therapeutic lifestyle changes which include good diet and exercise. Fall precautions discussed with patient. Follow up with Mae and schedule a visit with clinical pharm. For diabetic management.   Monitor blood sugars as demonstrated on blood sugar flow sheet keep appointment with Tammy in 3-4 weeks and bring these blood sugar readings and to that visit Exercise as much is possible tried to stop smoking, drink plenty of water and fluids Make all efforts possible especially to stop smoking   Take januvia 100 mg one daily   Arrie Senate MD

## 2013-08-17 LAB — BMP8+EGFR
BUN/Creatinine Ratio: 21 (ref 9–23)
BUN: 15 mg/dL (ref 6–24)
CO2: 23 mmol/L (ref 18–29)
Calcium: 9.1 mg/dL (ref 8.7–10.2)
Chloride: 96 mmol/L — ABNORMAL LOW (ref 97–108)
Creatinine, Ser: 0.73 mg/dL (ref 0.57–1.00)
GFR, EST AFRICAN AMERICAN: 110 mL/min/{1.73_m2} (ref >59)
GFR, EST NON AFRICAN AMERICAN: 96 mL/min/{1.73_m2} (ref >59)
Glucose: 263 mg/dL — ABNORMAL HIGH (ref 65–99)
POTASSIUM: 3.9 mmol/L (ref 3.5–5.2)
SODIUM: 138 mmol/L (ref 134–144)

## 2013-08-17 LAB — HEPATIC FUNCTION PANEL
ALBUMIN: 3.8 g/dL (ref 3.5–5.5)
ALT: 21 IU/L (ref 0–32)
AST: 19 IU/L (ref 0–40)
Alkaline Phosphatase: 79 IU/L (ref 39–117)
BILIRUBIN TOTAL: 0.2 mg/dL (ref 0.0–1.2)
Bilirubin, Direct: 0.09 mg/dL (ref 0.00–0.40)
TOTAL PROTEIN: 6.8 g/dL (ref 6.0–8.5)

## 2013-08-17 NOTE — Telephone Encounter (Signed)
Patient seen on 3/11

## 2013-08-18 ENCOUNTER — Other Ambulatory Visit: Payer: Self-pay | Admitting: Family Medicine

## 2013-08-30 ENCOUNTER — Ambulatory Visit: Payer: 59 | Admitting: Nurse Practitioner

## 2013-09-01 ENCOUNTER — Other Ambulatory Visit: Payer: Self-pay | Admitting: Family Medicine

## 2013-09-04 ENCOUNTER — Encounter: Payer: Self-pay | Admitting: Pharmacist

## 2013-09-04 ENCOUNTER — Ambulatory Visit (INDEPENDENT_AMBULATORY_CARE_PROVIDER_SITE_OTHER): Payer: 59 | Admitting: Pharmacist

## 2013-09-04 VITALS — BP 126/80 | HR 75 | Ht 70.0 in | Wt 256.0 lb

## 2013-09-04 DIAGNOSIS — I739 Peripheral vascular disease, unspecified: Secondary | ICD-10-CM

## 2013-09-04 DIAGNOSIS — R319 Hematuria, unspecified: Secondary | ICD-10-CM

## 2013-09-04 DIAGNOSIS — E785 Hyperlipidemia, unspecified: Secondary | ICD-10-CM

## 2013-09-04 LAB — POCT URINALYSIS DIPSTICK
BILIRUBIN UA: NEGATIVE
Glucose, UA: NEGATIVE
KETONES UA: NEGATIVE
Leukocytes, UA: NEGATIVE
Nitrite, UA: NEGATIVE
PH UA: 6
SPEC GRAV UA: 1.02
Urobilinogen, UA: NEGATIVE

## 2013-09-04 LAB — POCT UA - MICROSCOPIC ONLY
CASTS, UR, LPF, POC: NEGATIVE
Crystals, Ur, HPF, POC: NEGATIVE
Yeast, UA: NEGATIVE

## 2013-09-04 LAB — POCT INR: INR: 1.8

## 2013-09-04 MED ORDER — BLOOD GLUCOSE TEST VI STRP: ORAL_STRIP | Status: DC

## 2013-09-04 MED ORDER — ROSUVASTATIN CALCIUM 20 MG PO TABS: ORAL_TABLET | ORAL | Status: DC

## 2013-09-04 NOTE — Patient Instructions (Signed)
Blood glucose goals:     Fasting = 80 to 130     Within 2 hours of the start of a meal = less than 180.

## 2013-09-04 NOTE — Progress Notes (Signed)
Diabetes Flow Sheet:  Visit 1  Chief Complaint:   Chief Complaint  Patient presents with  . Diabetes    HPI:  Type 2DM seen by Dr Laurance Flatten about 3 weeks ago when was discovered DM was uncontrolled. Started Januvia 100mg  1 tablet daily. She has tried metformin in past but had GI discomfort.  Glimepiride caused hypoglycemia.   HBG readings - 337, 259, 279, 204, 274, 257, 238, 214, 148, 169, 288, 124, 183, 153   Exam Filed Vitals:   09/04/13 1624  BP: 126/80  Pulse: 75   Filed Weights   09/04/13 1624  Weight: 256 lb (116.121 kg)    HR:  RRR Edema:  trace  Polyuria:  Negative  (but patient c/o hemituria about 2 weeks ago and though it was related to starting Januvia, reports that hematuria better but would like checked today)  Polydipsia:  negative Polyphagia:  negative  BMI:  Body mass index is 36.73 kg/(m^2).   Weight changes:  Decreased 4 # General Appearance:  alert, oriented, no acute distress and obese Mood/Affect:  normal  Low fat/carbohydrate diet?  No - ice cream, sweets Nicotine Abuse?  Yes Medication Compliance?  Yes Exercise?  No Alcohol Abuse?  No   INR was 1.8 today  Lab Results  Component Value Date   HGBA1C 9.7% 08/16/2013    Lab Results  Component Value Date   CHOL 174 04/21/2013   LDLCALC 76 12/21/2012   TRIG 164* 12/21/2012     Medication Checklist: ACE Inhibitor/ARB?  Yes Lipid Lowering Agent?  Yes Aspirin?  No - nat applicable on warfarin and plavix Oral Hypoglycemic Agent(s)?  Yes  Assessment: 1.  type 2 Diabetes.  Improving controlled since starting Januvia 2.  Blood Pressure Control.  good 3.  Lipid Control.  Due recheck - last showed elevated Tg and low HDL  Recommendations: 1.  1500 calorie, carbohydrate counting diet.  Patient is counseled extensively on carbohydrate counting, serving sizes, saturated fat intake and meal planning.  Patient is instructed to eat 3 meals a day and 3 small snacks.  Patient will supplement snacks based on  physical activity. 2.  30 minutes of physical activity.  Patient is counseled to always carry glucose tablets, lifesavers, hard candies, etc., while exercising in case of hypoglycemic event. 3.  Patient is counseled on pathophysiology of diabetes and the risk of long-term complications.  Fasting blood glucose goals are 80-120mg /dL.  Post-prandial goals are < 140.  A1C goals < 6.5%. 4.  LDL goal of < 100, HDL > 40 and TG < 150; BP goal < 130/80 5.  Patient is counseled on proper use of glucometer and lancing device.  Patient is informed how often to test and how to respond to unsuitable results. 6.  Medication recommendations at this time are as follows:    Continue Januvia 100mg  1 tablet daily, Rx sent in for Crestor to [place]   Anticoagulation Dose Instructions as of 09/04/2013     [name] Tue Wed Thu Fri Sat   New Dose 5 mg 7.5 mg 5 mg 7.5 mg 5 mg 7.5 mg 5 mg    Description       Take 2 tablets today, then Continue 1 and 1/2 tablet Mondays, Wednesdays and Fridays and 1 tablet all other days.         Time spent counseling patient:  45 minutes  Referring provider:  Redge Gainer     PharmD:  Cherre Robins, [facility]

## 2013-09-28 ENCOUNTER — Ambulatory Visit: Payer: Self-pay

## 2013-10-09 ENCOUNTER — Ambulatory Visit (INDEPENDENT_AMBULATORY_CARE_PROVIDER_SITE_OTHER): Payer: 59 | Admitting: Pharmacist

## 2013-10-09 DIAGNOSIS — E119 Type 2 diabetes mellitus without complications: Secondary | ICD-10-CM

## 2013-10-09 DIAGNOSIS — R319 Hematuria, unspecified: Secondary | ICD-10-CM

## 2013-10-09 DIAGNOSIS — I739 Peripheral vascular disease, unspecified: Secondary | ICD-10-CM

## 2013-10-09 LAB — POCT URINALYSIS DIPSTICK
BILIRUBIN UA: NEGATIVE
Blood, UA: NEGATIVE
Glucose, UA: NEGATIVE
KETONES UA: NEGATIVE
Leukocytes, UA: NEGATIVE
Nitrite, UA: NEGATIVE
PH UA: 5
Protein, UA: NEGATIVE
Spec Grav, UA: 1.02
Urobilinogen, UA: NEGATIVE

## 2013-10-09 LAB — POCT UA - MICROSCOPIC ONLY
Casts, Ur, LPF, POC: NEGATIVE
Crystals, Ur, HPF, POC: NEGATIVE
MUCUS UA: NEGATIVE
RBC, urine, microscopic: NEGATIVE
Yeast, UA: NEGATIVE

## 2013-10-09 LAB — POCT INR: INR: 1.8

## 2013-10-09 MED ORDER — SITAGLIP PHOS-METFORMIN HCL ER 100-1000 MG PO TB24: 1.0000 | ORAL_TABLET | Freq: Every day | ORAL | Status: DC

## 2013-10-09 NOTE — Progress Notes (Signed)
Diabetes Flow Sheet:  Visit 1  Chief Complaint:   No chief complaint on file.   HPI:  Type 2DM seen by Dr Laurance Flatten about 3 weeks ago when was discovered DM was uncontrolled. Started Januvia 100mg  1 tablet daily. She has tried metformin in past but had GI discomfort though today she states that metformin kept her BG well controlled and maybe GI discomfort was related to hernia.  Glimepiride caused hypoglycemia.   HBG readings - still in 200's  Exam Edema:  trace  Polyuria:  Negative  Polydipsia:  negative Polyphagia:  negative    General Appearance:  alert, oriented, no acute distress and obese Mood/Affect:  normal  Low fat/carbohydrate diet?  Improved since last visit - trying to limit high sugar food Nicotine Abuse?  Yes Medication Compliance?  Yes Exercise?  No Alcohol Abuse?  No   INR was 1.8 today  Lab Results  Component Value Date   HGBA1C 9.7% 08/16/2013    Lab Results  Component Value Date   CHOL 174 04/21/2013   LDLCALC 76 12/21/2012   TRIG 164* 12/21/2012     Medication Checklist: ACE Inhibitor/ARB?  Yes Lipid Lowering Agent?  Yes Aspirin?  No - nat applicable on warfarin and plavix Oral Hypoglycemic Agent(s)?  Yes  Assessment: 1.  type 2 Diabetes. HBG improved but still elevated 2.  Blood Pressure Control.  good 3.  Lipid Control.  Due recheck - last showed elevated Tg and low HDL  Recommendations: 1.  Discontinue Januvia.  Start Janumet XR 100/1000mg  1 tablet daily with food. 2.  30 minutes of physical activity.  Patient is counseled to always carry glucose tablets, lifesavers, hard candies, etc., while exercising in case of hypoglycemic event. 3.  Patient is counseled on pathophysiology of diabetes and the risk of long-term complications.  Fasting blood glucose goals are 80-130mg /dL.  Post-prandial goals are < 180.  A1C goals < 7.0%. 4.  LDL goal of < 100, HDL > 40 and TG < 150; BP goal < 130/80 5.  Continue to check BG readings 1-2 times daily 6.    Anticoagulation Dose Instructions as of 10/09/2013     [name] Tue Wed Thu Fri Sat   New Dose 5 mg 7.5 mg 5 mg 7.5 mg 5 mg 7.5 mg 7.5 mg    Description       Take 2 tablets today, then Continue 1 and 1/2 tablet Mondays, Wednesdays, Fridays and Saturdays and 1 tablet all other days.       7.   Orders Placed This Encounter  Procedures  . POCT urinalysis dipstick  . POCT UA - Microscopic Only   8.  Referral to urologist for hematuria (since smoker)  Time spent counseling patient:  30 minutes  Referring provider:  Redge Gainer     PharmD:  Cherre Robins, [facility]

## 2013-10-09 NOTE — Patient Instructions (Addendum)
Anticoagulation Dose Instructions as of 10/09/2013     [name] Tue Wed Thu Fri Sat   New Dose 5 mg 7.5 mg 5 mg 7.5 mg 5 mg 7.5 mg 7.5 mg    Description       Take 2 tablets today, then Continue 1 and 1/2 tablet Mondays, Wednesdays, Fridays and Saturdays and 1 tablet all other days.      INR was 1.8 today  Take JanuMet 50/500mg  1 tablet daily with food for 1 week, then start Janumet XR 100/1000mg  1 tablet daily with food

## 2013-10-28 ENCOUNTER — Other Ambulatory Visit: Payer: Self-pay | Admitting: Physician Assistant

## 2013-11-02 ENCOUNTER — Other Ambulatory Visit: Payer: Self-pay | Admitting: Family Medicine

## 2013-11-14 ENCOUNTER — Telehealth: Payer: Self-pay | Admitting: Family Medicine

## 2013-11-14 DIAGNOSIS — I10 Essential (primary) hypertension: Secondary | ICD-10-CM

## 2013-11-14 DIAGNOSIS — E785 Hyperlipidemia, unspecified: Secondary | ICD-10-CM

## 2013-11-16 MED ORDER — SITAGLIP PHOS-METFORMIN HCL ER 100-1000 MG PO TB24: 1.0000 | ORAL_TABLET | Freq: Every day | ORAL | Status: DC

## 2013-11-16 MED ORDER — ROSUVASTATIN CALCIUM 20 MG PO TABS: ORAL_TABLET | ORAL | Status: DC

## 2013-11-16 MED ORDER — OLMESARTAN MEDOXOMIL 20 MG PO TABS: 20.0000 mg | ORAL_TABLET | Freq: Every day | ORAL | Status: DC

## 2013-11-16 NOTE — Telephone Encounter (Signed)
#   28 Crestor 20mg  and #56 Januvia 100mg  (+ rx for Metformin 100mg  take 1 a day along with Januvia to equal Janumet) left at front desk. Also rxs for crestor, [place] and benicar left with 1 month free coupons.  Called [facility] - left message on VM

## 2013-12-05 ENCOUNTER — Other Ambulatory Visit: Payer: Self-pay | Admitting: Family Medicine

## 2013-12-05 ENCOUNTER — Other Ambulatory Visit: Payer: Self-pay | Admitting: General Practice

## 2013-12-06 ENCOUNTER — Telehealth: Payer: Self-pay | Admitting: Family Medicine

## 2013-12-06 NOTE — Telephone Encounter (Signed)
Called and changed at [facility]

## 2013-12-06 NOTE — Telephone Encounter (Signed)
This is okay to change the prescription to generic Prilosec. Please call into [facility] and discontinue the dexilant

## 2013-12-19 ENCOUNTER — Telehealth: Payer: Self-pay | Admitting: Pharmacist

## 2013-12-19 NOTE — Telephone Encounter (Signed)
Called to check on patient since she recently lost job.  Left message to see if she has been able to get her medications and if she is going to either free clinic or health department for protime / INR checks.

## 2013-12-29 NOTE — Telephone Encounter (Signed)
Left message again today for patient - trying to make sure she is able to get meds.

## 2014-01-02 ENCOUNTER — Other Ambulatory Visit: Payer: Self-pay | Admitting: Family Medicine

## 2014-01-03 ENCOUNTER — Telehealth: Payer: Self-pay | Admitting: Pharmacist

## 2014-01-03 DIAGNOSIS — E785 Hyperlipidemia, unspecified: Secondary | ICD-10-CM

## 2014-01-03 MED ORDER — SITAGLIPTIN PHOSPHATE 100 MG PO TABS: 100.0000 mg | ORAL_TABLET | Freq: Every day | ORAL | Status: DC

## 2014-01-03 MED ORDER — METFORMIN HCL ER 500 MG PO TB24: 1000.0000 mg | ORAL_TABLET | Freq: Every day | ORAL | Status: DC

## 2014-01-03 MED ORDER — ROSUVASTATIN CALCIUM 20 MG PO TABS: ORAL_TABLET | ORAL | Status: DC

## 2014-01-03 NOTE — Telephone Encounter (Signed)
Patient has lost job and she cannot afford janumet or Crestor I left #28 samples of Crestor 20mg  (take 1 tablet daily) and #42 Januvia 100mg  take 1 tablet daily at front desk.  Rx sent to [facility] for metformin

## 2014-01-18 ENCOUNTER — Other Ambulatory Visit: Payer: Self-pay | Admitting: Nurse Practitioner

## 2014-01-19 NOTE — Telephone Encounter (Signed)
Patient has lost job - protime to be checked this month

## 2014-01-19 NOTE — Telephone Encounter (Signed)
Last seen 08/16/13  DWM

## 2014-01-29 ENCOUNTER — Ambulatory Visit (INDEPENDENT_AMBULATORY_CARE_PROVIDER_SITE_OTHER): Payer: 59 | Admitting: Pharmacist

## 2014-01-29 ENCOUNTER — Encounter: Payer: Self-pay | Admitting: Pharmacist

## 2014-01-29 VITALS — BP 112/70 | HR 80 | Ht 70.0 in | Wt 244.0 lb

## 2014-01-29 DIAGNOSIS — E119 Type 2 diabetes mellitus without complications: Secondary | ICD-10-CM

## 2014-01-29 DIAGNOSIS — F17200 Nicotine dependence, unspecified, uncomplicated: Secondary | ICD-10-CM

## 2014-01-29 DIAGNOSIS — I739 Peripheral vascular disease, unspecified: Secondary | ICD-10-CM

## 2014-01-29 DIAGNOSIS — R635 Abnormal weight gain: Secondary | ICD-10-CM

## 2014-01-29 DIAGNOSIS — E669 Obesity, unspecified: Secondary | ICD-10-CM

## 2014-01-29 DIAGNOSIS — E039 Hypothyroidism, unspecified: Secondary | ICD-10-CM

## 2014-01-29 LAB — POCT GLYCOSYLATED HEMOGLOBIN (HGB A1C): Hemoglobin A1C: 6.7

## 2014-01-29 LAB — POCT INR: INR: 2.1

## 2014-01-29 NOTE — Patient Instructions (Signed)
Anticoagulation Dose Instructions as of 01/29/2014     [name] Tue Wed Thu Fri Sat   New Dose 5 mg 7.5 mg 5 mg 7.5 mg 5 mg 7.5 mg 7.5 mg    Description        Continue 1 and 1/2 tablet Mondays, Wednesdays, Fridays and Saturdays and 1 tablet all other days.

## 2014-01-29 NOTE — Progress Notes (Signed)
Diabetes Flow Sheet:  Visit 1  Chief Complaint:   Chief Complaint  Patient presents with  . Atrial Fibrillation  . Diabetes    HPI:  Type 2DM diagnosed about 5 months ago.  Patient has no been in for recheck INR or other labs because about 2 months ago she lost her job and does not have insurance. Currently taking Januvia 116m 1 tablet daily and metformin XR 5037m2 tablets daily for diabetes.   She is also taking warfarin daily for Afib (see anticoag note for dosing) Since being out of work she is exercising more and paying more attention to eating healthier.  She has lost about 12# since 09/04/13.   HBG readings - no checking regularly due to cost  Exam Filed Vitals:   01/29/14 1100  BP: 112/70  Pulse: 80   Filed Weights   01/29/14 1100  Weight: 244 lb (110.678 kg)    HR:  RRR Edema:  negative Polyuria:  Negative  Polydipsia:  negative Polyphagia:  negative  BMI:  Body mass index is 35.01 kg/(m^2).   Weight changes:  Decreased 12 # General Appearance:  alert, oriented, no acute distress and obese Mood/Affect:  normal  Low fat/carbohydrate diet?  Yes Nicotine Abuse?  Yes Medication Compliance?  Yes Exercise?  No Alcohol Abuse?  No   Lab Results  Component Value Date   HGBA1C 6.7 01/29/2014    Lab Results  Component Value Date   CHOL 174 04/21/2013   HDL 32* 04/21/2013   LDLCALC 104* 04/21/2013   TRIG 192* 04/21/2013    INR = 2.1 today  Medication Checklist: ACE Inhibitor/ARB?  Yes Lipid Lowering Agent?  Yes Aspirin?  No - not applicable on warfarin and plavix Oral Hypoglycemic Agent(s)?  Yes  Assessment: 1.  type 2 Diabetes.  Has met A1c goal - A1c = 6.7% 2.  Blood Pressure Control.  good 3.  Lipid Control.  Due recheck - last showed elevated Tg and low HDL - due recheck 4.  Therapeutic anticoagulation 5.  Hypothyroidism - due recheck last TSH elevated 6.   Obesity - improved BMI / weight with recent diet changes and increased exercise 7.  Tobacco use  disorder - patient in contemplative stage - not committed to quitting yet   Recommendations: 1.  Continue to limit caloric intake and follow carbohydrate counting diet.  2.  30 minutes of physical activity.  Patient is counseled to always carry glucose tablets, lifesavers, hard candies, etc., while exercising in case of hypoglycemic event. 3.  Reviewed pathophysiology of diabetes and the risk of long-term complications.  Fasting blood glucose goals are 80-1204mL.  Post-prandial goals are < 140.  A1C goals < 6.5%. 4.  LDL goal of < 100, HDL > 40 and TG < 150; BP goal < 130/80 5.  Medication recommendations at this time are as follows:  No changes 6.  Reviewed benefits of smoking cessation  Patient was given samples of Januvia 100m86mily #35 and Benicar 40mg46me 1/2 tablet daily #28   Anticoagulation Dose Instructions as of 01/29/2014     Sun MDorene GrebeWed Thu Fri Sat   New Dose 5 mg 7.5 mg 5 mg 7.5 mg 5 mg 7.5 mg 7.5 mg    Description        Continue 1 and 1/2 tablet Mondays, Wednesdays, Fridays and Saturdays and 1 tablet all other days.         Time spent counseling patient:  45 minutes  Referring provider:  Redge Gainer     PharmD:  Cherre Robins, PHARMD , CPP, CDE

## 2014-01-30 LAB — THYROID PANEL WITH TSH
Free Thyroxine Index: 2.1 (ref 1.2–4.9)
T3 UPTAKE RATIO: 31 % (ref 24–39)
T4, Total: 6.8 ug/dL (ref 4.5–12.0)
TSH: 1.63 u[IU]/mL (ref 0.450–4.500)

## 2014-02-28 ENCOUNTER — Other Ambulatory Visit: Payer: Self-pay | Admitting: General Practice

## 2014-02-28 ENCOUNTER — Other Ambulatory Visit: Payer: Self-pay | Admitting: Pharmacist

## 2014-03-12 ENCOUNTER — Ambulatory Visit (INDEPENDENT_AMBULATORY_CARE_PROVIDER_SITE_OTHER): Payer: Self-pay | Admitting: Pharmacist

## 2014-03-12 ENCOUNTER — Encounter: Payer: Self-pay | Admitting: Pharmacist

## 2014-03-12 VITALS — Ht 70.0 in | Wt 246.0 lb

## 2014-03-12 DIAGNOSIS — I739 Peripheral vascular disease, unspecified: Secondary | ICD-10-CM

## 2014-03-12 LAB — POCT INR: INR: 1.5

## 2014-03-12 NOTE — Patient Instructions (Signed)
Anticoagulation Dose Instructions as of 03/12/2014     [name] Tue Wed Thu Fri Sat   New Dose 5 mg 7.5 mg 5 mg 7.5 mg 5 mg 7.5 mg 7.5 mg    Description       Take extra 1/2 today, then Continue 1 and 1/2 tablet Mondays, Wednesdays, Fridays and Saturdays and 1 tablet sundays, tuesdays and thursdays.      INR was 1.5 today

## 2014-03-15 ENCOUNTER — Other Ambulatory Visit: Payer: Self-pay | Admitting: Family Medicine

## 2014-03-20 ENCOUNTER — Other Ambulatory Visit: Payer: Self-pay | Admitting: Family Medicine

## 2014-03-23 ENCOUNTER — Ambulatory Visit (INDEPENDENT_AMBULATORY_CARE_PROVIDER_SITE_OTHER): Payer: Self-pay | Admitting: Family Medicine

## 2014-03-23 ENCOUNTER — Encounter: Payer: Self-pay | Admitting: Family Medicine

## 2014-03-23 VITALS — BP 143/69 | HR 62 | Temp 96.5°F | Ht 70.0 in | Wt 246.0 lb

## 2014-03-23 DIAGNOSIS — R635 Abnormal weight gain: Secondary | ICD-10-CM

## 2014-03-23 DIAGNOSIS — E785 Hyperlipidemia, unspecified: Secondary | ICD-10-CM

## 2014-03-23 DIAGNOSIS — Z7901 Long term (current) use of anticoagulants: Secondary | ICD-10-CM

## 2014-03-23 DIAGNOSIS — I10 Essential (primary) hypertension: Secondary | ICD-10-CM

## 2014-03-23 DIAGNOSIS — I739 Peripheral vascular disease, unspecified: Secondary | ICD-10-CM

## 2014-03-23 DIAGNOSIS — Z720 Tobacco use: Secondary | ICD-10-CM

## 2014-03-23 DIAGNOSIS — F329 Major depressive disorder, single episode, unspecified: Secondary | ICD-10-CM

## 2014-03-23 DIAGNOSIS — R059 Cough, unspecified: Secondary | ICD-10-CM

## 2014-03-23 DIAGNOSIS — Z719 Counseling, unspecified: Secondary | ICD-10-CM

## 2014-03-23 DIAGNOSIS — F32A Depression, unspecified: Secondary | ICD-10-CM

## 2014-03-23 DIAGNOSIS — Z9229 Personal history of other drug therapy: Secondary | ICD-10-CM

## 2014-03-23 DIAGNOSIS — R05 Cough: Secondary | ICD-10-CM

## 2014-03-23 DIAGNOSIS — E669 Obesity, unspecified: Secondary | ICD-10-CM

## 2014-03-23 DIAGNOSIS — K219 Gastro-esophageal reflux disease without esophagitis: Secondary | ICD-10-CM

## 2014-03-23 DIAGNOSIS — E039 Hypothyroidism, unspecified: Secondary | ICD-10-CM

## 2014-03-23 DIAGNOSIS — F17200 Nicotine dependence, unspecified, uncomplicated: Secondary | ICD-10-CM

## 2014-03-23 DIAGNOSIS — E1159 Type 2 diabetes mellitus with other circulatory complications: Secondary | ICD-10-CM

## 2014-03-23 NOTE — Progress Notes (Deleted)
   Subjective:    Patient ID: [name], female    DOB: 1960/10/02, 53 y.o.   MRN: [id number]  HPI Patient here today for a disability discussion.        Patient Active Problem List   Diagnosis Date Noted  . Hypothyroidism 01/29/2014  . Obesity (BMI 30-39.9) 08/16/2013  . Acute bronchitis 07/28/2013  . Impetigo 07/28/2013  . COPD (chronic obstructive pulmonary disease)   . Uterine cancer   . Peripheral vascular occlusive disease 05/17/2013  . GERD (gastroesophageal reflux disease) 10/18/2012  . Hernia, hiatal 10/18/2012  . DIABETES MELLITUS 04/18/2008  . HYPERLIPIDEMIA 04/18/2008  . OBESITY 04/18/2008  . SMOKER 04/18/2008  . HYPERTENSION 04/18/2008   Outpatient Encounter Prescriptions as of 03/23/2014  Medication Sig  . albuterol (PROVENTIL HFA;VENTOLIN HFA) 108 (90 BASE) MCG/ACT inhaler Inhale 2 puffs into the lungs every 6 (six) hours as needed for wheezing or shortness of breath.  . clopidogrel (PLAVIX) 75 MG tablet TAKE 1 TABLET DAILY  . DEXILANT 60 MG capsule TAKE  (1)  CAPSULE  TWICE DAILY.  Marland Kitchen escitalopram (LEXAPRO) 20 MG tablet Take 1 tablet (20 mg total) by mouth daily.  . furosemide (LASIX) 20 MG tablet Take 1 tablet (20 mg total) by mouth daily.  . furosemide (LASIX) 20 MG tablet TAKE 1 TABLET DAILY  . Glucose Blood (BLOOD GLUCOSE TEST STRIPS) STRP One touch ultra 2 test strips.  Use to check BG up to twice a day.  Dx:  250.02  . levothyroxine (SYNTHROID, LEVOTHROID) 100 MCG tablet Take 1 tablet (100 mcg total) by mouth daily before breakfast.  . metFORMIN (GLUCOPHAGE XR) 500 MG 24 hr tablet Take 2 tablets (1,000 mg total) by mouth daily with breakfast.  . mupirocin ointment (BACTROBAN) 2 % APPLY TO THE NOSE AS DIRECTED 2 TIMES A DAY  . olmesartan (BENICAR) 20 MG tablet Take 1 tablet (20 mg total) by mouth daily.  . Potassium Gluconate 595 MG CAPS Take 1 capsule by mouth daily.  . rosuvastatin (CRESTOR) 20 MG tablet Take 1 tablet every day  .  sitaGLIPtin (JANUVIA) 100 MG tablet Take 1 tablet (100 mg total) by mouth daily.  . Vitamin D, Ergocalciferol, (DRISDOL) 50000 UNITS CAPS capsule Take 50,000 Units by mouth every 7 (seven) days.  Marland Kitchen warfarin (COUMADIN) 5 MG tablet TAKE AS DIRECTED UP TO 1 1/2 TABLETS A DAY    Review of Systems  Constitutional: Negative.   HENT: Negative.   Eyes: Negative.   Respiratory: Negative.   Cardiovascular: Negative.   Gastrointestinal: Negative.   Endocrine: Negative.   Genitourinary: Negative.   Musculoskeletal: Negative.   Skin: Negative.   Allergic/Immunologic: Negative.   Neurological: Negative.   Hematological: Negative.   Psychiatric/Behavioral: Negative.        Objective:   Physical Exam BP 143/69  Pulse 62  Temp(Src) 96.5 F (35.8 C) (Oral)  Ht 5\' 10"  (1.778 m)  Wt 246 lb (111.585 kg)  BMI 35.30 kg/m2        Assessment & Plan:

## 2014-03-23 NOTE — Progress Notes (Signed)
Subjective:    Patient ID: [name], female    DOB: 06-27-1960, 53 y.o.   MRN: [id number]  HPI Patient here today to discuss disability and all of her chronic health problems.       Patient Active Problem List   Diagnosis Date Noted  . Hypothyroidism 01/29/2014  . Obesity (BMI 30-39.9) 08/16/2013  . Acute bronchitis 07/28/2013  . Impetigo 07/28/2013  . COPD (chronic obstructive pulmonary disease)   . Uterine cancer   . Peripheral vascular occlusive disease 05/17/2013  . GERD (gastroesophageal reflux disease) 10/18/2012  . Hernia, hiatal 10/18/2012  . DIABETES MELLITUS 04/18/2008  . HYPERLIPIDEMIA 04/18/2008  . OBESITY 04/18/2008  . SMOKER 04/18/2008  . HYPERTENSION 04/18/2008   Outpatient Encounter Prescriptions as of 03/23/2014  Medication Sig  . albuterol (PROVENTIL HFA;VENTOLIN HFA) 108 (90 BASE) MCG/ACT inhaler Inhale 2 puffs into the lungs every 6 (six) hours as needed for wheezing or shortness of breath.  . clopidogrel (PLAVIX) 75 MG tablet TAKE 1 TABLET DAILY  . DEXILANT 60 MG capsule TAKE  (1)  CAPSULE  TWICE DAILY.  Marland Kitchen escitalopram (LEXAPRO) 20 MG tablet Take 1 tablet (20 mg total) by mouth daily.  . furosemide (LASIX) 20 MG tablet Take 1 tablet (20 mg total) by mouth daily.  . furosemide (LASIX) 20 MG tablet TAKE 1 TABLET DAILY  . Glucose Blood (BLOOD GLUCOSE TEST STRIPS) STRP One touch ultra 2 test strips.  Use to check BG up to twice a day.  Dx:  250.02  . levothyroxine (SYNTHROID, LEVOTHROID) 100 MCG tablet Take 1 tablet (100 mcg total) by mouth daily before breakfast.  . metFORMIN (GLUCOPHAGE XR) 500 MG 24 hr tablet Take 2 tablets (1,000 mg total) by mouth daily with breakfast.  . mupirocin ointment (BACTROBAN) 2 % APPLY TO THE NOSE AS DIRECTED 2 TIMES A DAY  . olmesartan (BENICAR) 20 MG tablet Take 1 tablet (20 mg total) by mouth daily.  . Potassium Gluconate 595 MG CAPS Take 1 capsule by mouth daily.  . rosuvastatin (CRESTOR) 20 MG tablet Take  1 tablet every day  . sitaGLIPtin (JANUVIA) 100 MG tablet Take 1 tablet (100 mg total) by mouth daily.  . Vitamin D, Ergocalciferol, (DRISDOL) 50000 UNITS CAPS capsule Take 50,000 Units by mouth every 7 (seven) days.  Marland Kitchen warfarin (COUMADIN) 5 MG tablet TAKE AS DIRECTED UP TO 1 1/2 TABLETS A DAY    Review of Systems The patient and I had a long discussion of about 30 minutes regarding her multiple medical problems. He started back in the early 90s with back surgery for a ruptured disc. Only one disc was repaired and 2 others were left unrepaired. The patient is a smoker. She has developed diabetes. She has had uterine cancer with a hysterectomy and oophorectomy. She developed circulatory problems and has had multiple surgeries because of poor circulation to the right leg these surgeries were done by vascular surgeon from August of 2007 to September of 2008. She's had bypass surgery to the leg. 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 She got to the point recently where she was unable to walk around the classroom because of severe pain secondary to lack of circulation to her extremities. Because of poor circulation she has had one toe amputated on the right foot. She is attempting to get her blood sugar under better control and is unable to tolerate any further surgeries on her leg cause of the multiple  surgeries and stents which have been done. She has severe acid reflux. She has COPD. She has medication for thyroid replacement. She certainly has advanced circulatory problems and breathing problems for someone her age. There is a strong family history of vascular disease in her brother and her father. Her mother had a history of COPD. This patient has many complaints which are justified by the physical problems that she has.    Objective:   Physical Exam BP 143/69  Pulse 62  Temp(Src) 96.5 F (35.8 C) (Oral)  Ht 5\' 10"  (1.778 m)  Wt 246 lb (111.585 kg)   BMI 35.30 kg/m2 No direct physical exam was done today as we reviewed her options with her multiple problems and where she should go from this point. She obviously has shortness of breath chronic cough and circulation issues with legs. Based on her history of trying to be on her feet and unable to do this because of the pain I feel that she would be a good candidate for disability.       Assessment & Plan:  1. Encounter for consultation  2. Peripheral vascular occlusive disease  3. Hypothyroidism, unspecified hypothyroidism type  4. Obesity (BMI 30-39.9)  5. Tobacco use disorder  6. Essential hypertension, benign  7. Type 2 diabetes mellitus with other circulatory complications  8. Gastroesophageal reflux disease, esophagitis presence not specified  9. Cough  10. Depression  11. Hyperlipidemia  12. HX: anticoagulation  Patient Instructions  Continue current meds Our office will help you with applying for disability because of her chronic medical problems especially circulatory related Please schedule a visit with vascular surgeon that you have seen and the past Requested information for [place] to write a letter supporting your application for disability   Arrie Senate MD

## 2014-03-23 NOTE — Patient Instructions (Signed)
Continue current meds Our office will help you with applying for disability because of her chronic medical problems especially circulatory related Please schedule a visit with vascular surgeon that you have seen and the past Requested information for [place] to write a letter supporting your application for disability

## 2014-03-26 DIAGNOSIS — Z0279 Encounter for issue of other medical certificate: Secondary | ICD-10-CM | POA: Diagnosis not present

## 2014-03-30 ENCOUNTER — Telehealth: Payer: Self-pay

## 2014-03-30 DIAGNOSIS — I739 Peripheral vascular disease, unspecified: Secondary | ICD-10-CM

## 2014-03-30 DIAGNOSIS — R202 Paresthesia of skin: Secondary | ICD-10-CM

## 2014-03-30 DIAGNOSIS — R200 Anesthesia of skin: Secondary | ICD-10-CM

## 2014-03-30 NOTE — Telephone Encounter (Signed)
Phone call from pt. with request for Dr. Donnetta Hutching to complete paperwork for Disability.  Stated she is applying for [employer] Disability.  Last seen in our office 07/08/10.  Advised pt. That she would have to be reevaluated for Dr. Donnetta Hutching to comment on her ability to work or not.  Questioned about any symptoms she currently has.  Stated her right leg has been the one with most problems.  Reported her right foot has been numb since her surgery.  Stated the sole of her left foot tingles. Reported she has pain in right leg with activity, and occasionally at night @ rest.  Denies any open sores of lower extremities.  Will discuss with Dr. Donnetta Hutching, and contact pt. to schedule appts., per his recommendation.  Agrees with plan.

## 2014-04-03 NOTE — Telephone Encounter (Signed)
Discussed pt's situation with Dr. Donnetta Hutching.  Recommended to schedule for ABI's and office visit to evaluate PVD, and indication for Disability.

## 2014-04-03 NOTE — Telephone Encounter (Signed)
Spoke with patient to schedule for 11/17 @1130 , dpm

## 2014-04-18 ENCOUNTER — Telehealth: Payer: Self-pay | Admitting: Family Medicine

## 2014-04-18 DIAGNOSIS — Z0289 Encounter for other administrative examinations: Secondary | ICD-10-CM

## 2014-04-20 ENCOUNTER — Telehealth: Payer: Self-pay | Admitting: Pharmacist

## 2014-04-20 NOTE — Telephone Encounter (Signed)
Tried to call patient to schedule protime recheck which is past due.  LMOVM for patient to call office to schedule appt.

## 2014-04-23 ENCOUNTER — Encounter: Payer: Self-pay | Admitting: Vascular Surgery

## 2014-04-24 ENCOUNTER — Ambulatory Visit (INDEPENDENT_AMBULATORY_CARE_PROVIDER_SITE_OTHER): Payer: PRIVATE HEALTH INSURANCE | Admitting: Vascular Surgery

## 2014-04-24 ENCOUNTER — Ambulatory Visit (HOSPITAL_COMMUNITY)
Admission: RE | Admit: 2014-04-24 | Discharge: 2014-04-24 | Disposition: A | Discharge status: Home / Self Care | Payer: PRIVATE HEALTH INSURANCE | Source: Ambulatory Visit | Attending: Vascular Surgery | Admitting: Vascular Surgery

## 2014-04-24 ENCOUNTER — Encounter: Payer: Self-pay | Admitting: Vascular Surgery

## 2014-04-24 VITALS — BP 105/64 | HR 71 | Ht 70.0 in | Wt 241.4 lb

## 2014-04-24 DIAGNOSIS — R202 Paresthesia of skin: Secondary | ICD-10-CM

## 2014-04-24 DIAGNOSIS — R200 Anesthesia of skin: Secondary | ICD-10-CM

## 2014-04-24 DIAGNOSIS — I739 Peripheral vascular disease, unspecified: Secondary | ICD-10-CM | POA: Insufficient documentation

## 2014-04-24 DIAGNOSIS — R208 Other disturbances of skin sensation: Secondary | ICD-10-CM | POA: Insufficient documentation

## 2014-04-24 NOTE — Progress Notes (Signed)
Patient name: [name] MRN: [id number] DOB: 06/04/1961 Sex: female   Referred by: Laurance Flatten  Reason for referral:  Chief Complaint  Patient presents with  . New Evaluation    c/o numbness of R LE and tingling of L foot     HISTORY OF PRESENT ILLNESS: Patient is seen today for follow-up of her long history of lower extremity arterial insufficiency. She has a very complex past history. She initially underwent right femoral-popliteal bypass with saphenous vein in August 2007 when she suffered atheroemboli to fourth and fifth toe secondary to severe stenosis in her right superficial femoral artery. She also had amputation of fourth and fifth toes that time had eventual healing of this. She was extremely young at 29 during this initial presentation. She suffered Charvi Gammage failure of her bypass and underwent multiple revision attempts including a thrombectomy and revision and eventually complete replacement with a new left vein femoropopliteal to her right leg and even prosthetic graft. All of these failed. Portions she is been relatively stable since her last known occlusion and 2011. She does have severe limiting claudication but fortunately has had no critical limb ischemia with no rest pain and tissue loss. This makes it difficult difficult for her to walk long distances or standing for prolongedperiods of time. Unfortunately she does continue to smoke cigarettes. She has no right legs symptoms.  Past Medical History  Diagnosis Date  . DM (diabetes mellitus)   . Hypertension   . Hypothyroid   . Hyperlipemia   . DVT (deep venous thrombosis)     x5  . GERD (gastroesophageal reflux disease)   . UTI (urinary tract infection)   . Family history of colon cancer   . PVD (peripheral vascular disease)   . Depression   . Obesity   . COPD (chronic obstructive pulmonary disease)   . Uterine cancer     Past Surgical History  Procedure Laterality Date  . Abdominal hysterectomy    .  Toe amputation      right  . Lumbar disc surgery      L4-L5  . Femoral bypass      x 5  . Cesarean section    . Tonsillectomy      History   Social History  . Marital Status: Married    Spouse Name: N/A    Number of Children: 1  . Years of Education: N/A   Occupational History  . teacher    Social History Main Topics  . Smoking status: Current Every Day Smoker -- 0.50 packs/day for 38 years    Types: Cigarettes  . Smokeless tobacco: Never Used  . Alcohol Use: No  . Drug Use: No  . Sexual Activity: Not on file   Other Topics Concern  . Not on file   Social History Narrative    Family History  Problem Relation Age of Onset  . Colon cancer Brother 24  . Hypertension Mother   . Hypothyroidism Mother   . Heart disease Father   . Diabetes Father   . Hyperlipidemia Father   . Clotting disorder Brother   . Diabetes Brother   . Heart disease Brother     Allergies as of 04/24/2014 - Review Complete 04/24/2014  Allergen Reaction Noted  . Cortisone  06/17/2012  . Prednisone  06/17/2012    Current Outpatient Prescriptions on File Prior to Visit  Medication Sig Dispense Refill  . albuterol (PROVENTIL HFA;VENTOLIN HFA) 108 (90 BASE) MCG/ACT inhaler Inhale 2  puffs into the lungs every 6 (six) hours as needed for wheezing or shortness of breath. 1 Inhaler 2  . clopidogrel (PLAVIX) 75 MG tablet TAKE 1 TABLET DAILY 30 tablet 3  . DEXILANT 60 MG capsule TAKE  (1)  CAPSULE  TWICE DAILY. 30 capsule 3  . escitalopram (LEXAPRO) 20 MG tablet Take 1 tablet (20 mg total) by mouth daily. 90 tablet 1  . furosemide (LASIX) 20 MG tablet Take 1 tablet (20 mg total) by mouth daily. 90 tablet 1  . furosemide (LASIX) 20 MG tablet TAKE 1 TABLET DAILY 30 tablet 3  . Glucose Blood (BLOOD GLUCOSE TEST STRIPS) STRP One touch ultra 2 test strips.  Use to check BG up to twice a day.  Dx:  250.02 100 each 5  . levothyroxine (SYNTHROID, LEVOTHROID) 100 MCG tablet Take 1 tablet (100 mcg total) by  mouth daily before breakfast. 30 tablet 10  . metFORMIN (GLUCOPHAGE XR) 500 MG 24 hr tablet Take 2 tablets (1,000 mg total) by mouth daily with breakfast. 60 tablet 2  . mupirocin ointment (BACTROBAN) 2 % APPLY TO THE NOSE AS DIRECTED 2 TIMES A DAY 22 g 1  . olmesartan (BENICAR) 20 MG tablet Take 1 tablet (20 mg total) by mouth daily. 30 tablet 0  . Potassium Gluconate 595 MG CAPS Take 1 capsule by mouth daily.    . rosuvastatin (CRESTOR) 20 MG tablet Take 1 tablet every day 28 tablet 0  . sitaGLIPtin (JANUVIA) 100 MG tablet Take 1 tablet (100 mg total) by mouth daily. 42 tablet 0  . Vitamin D, Ergocalciferol, (DRISDOL) 50000 UNITS CAPS capsule Take 50,000 Units by mouth every 7 (seven) days.    Marland Kitchen warfarin (COUMADIN) 5 MG tablet TAKE AS DIRECTED UP TO 1 1/2 TABLETS A DAY 45 tablet 2   No current facility-administered medications on file prior to visit.     REVIEW OF SYSTEMS:  Positives indicated with an "X"  CARDIOVASCULAR:  [ ]  chest pain   [ ]  chest pressure   [ ]  palpitations   [ ]  orthopnea   [ ]  dyspnea on exertion   [x ] claudication   [ ]  rest pain   [ ]  DVT   [ ]  phlebitis PULMONARY:   [ ]  productive cough   [ ]  asthma   [x ] wheezing NEUROLOGIC:   [x ] weakness  [ x] paresthesias  [ ]  aphasia  [ ]  amaurosis  [ ]  dizziness HEMATOLOGIC:   [ ]  bleeding problems   [ ]  clotting disorders MUSCULOSKELETAL:  [ ]  joint pain   [ ]  joint swelling GASTROINTESTINAL: [ ]   blood in stool  [ ]   hematemesis GENITOURINARY:  [ ]   dysuria  [ ]   hematuria PSYCHIATRIC:  [ ]  history of major depression INTEGUMENTARY:  [ ]  rashes  [ ]  ulcers CONSTITUTIONAL:  [ ]  fever   [ ]  chills  PHYSICAL EXAMINATION:  General: The patient is a well-nourished female, in no acute distress. Vital signs are BP 105/64 mmHg  Pulse 71  Ht 5\' 10"  (1.778 m)  Wt 241 lb 6.4 oz (109.498 kg)  BMI 34.64 kg/m2  SpO2 97% Pulmonary: There is a good air exchange   Musculoskeletal: There are no major deformities.  There is  no significant extremity pain. Neurologic: No focal weakness or paresthesias are detected, Skin: There are no ulcer or rashes noted. Psychiatric: The patient has normal affect. Cardiovascularpalpable radial and femoral pulses. Absent popliteal and distal pulses bilaterally  [facility] Vascular Lab Studies:  Ordered and Independently Reviewed a invasive studies today reveal monophasic waveforms tibial level bilaterally. She does have medial calcification making ankle arm indices unreliable.  Impression and Plan:  Moderate left leg and moderate to severe right leg arterial insufficiency. She is unreconstructable from a standpoint of her right leg with multiple failed infrainguinal bypasses. Fortunately she is not regressed limb threatening ischemia. I again explained the critical importance of smoking cessation to her. Recommend that she continue her usual activities and walking program is much as possible. She will notify should she develop any lower extremity tissue loss. Otherwise we'll see [place] on as-needed basis    Tito Ausmus [facility]: [phone]        i

## 2014-04-25 ENCOUNTER — Telehealth: Payer: Self-pay | Admitting: *Deleted

## 2014-04-25 NOTE — Telephone Encounter (Signed)
Dwm gave [name]  Message that last day of work for this pt was 10-31-13 But that she should have came out of work about 1 yr prior to that date.

## 2014-05-21 NOTE — Telephone Encounter (Signed)
Patient called and appt made for 05/23/14 at 11am

## 2014-05-23 ENCOUNTER — Other Ambulatory Visit: Payer: Self-pay | Admitting: General Practice

## 2014-05-23 ENCOUNTER — Ambulatory Visit (INDEPENDENT_AMBULATORY_CARE_PROVIDER_SITE_OTHER): Payer: Self-pay | Admitting: Pharmacist

## 2014-05-23 ENCOUNTER — Other Ambulatory Visit: Payer: Self-pay | Admitting: Pharmacist

## 2014-05-23 VITALS — Wt 241.5 lb

## 2014-05-23 DIAGNOSIS — I739 Peripheral vascular disease, unspecified: Secondary | ICD-10-CM

## 2014-05-23 DIAGNOSIS — E785 Hyperlipidemia, unspecified: Secondary | ICD-10-CM

## 2014-05-23 LAB — POCT INR: INR: 1.2

## 2014-05-23 MED ORDER — SITAGLIPTIN PHOSPHATE 100 MG PO TABS: 100.0000 mg | ORAL_TABLET | Freq: Every day | ORAL | Status: DC

## 2014-05-23 MED ORDER — ROSUVASTATIN CALCIUM 20 MG PO TABS: ORAL_TABLET | ORAL | Status: DC

## 2014-05-23 NOTE — Patient Instructions (Signed)
Anticoagulation Dose Instructions as of 05/23/2014      [name] Tue Wed Thu Fri Sat   New Dose 7.5 mg 5 mg 7.5 mg 7.5 mg 7.5 mg 5 mg 7.5 mg    Description        Take 2 tablets today and tomorrow.  Increase to 1 tablet on mondays and fridays.  Take 1 and 1/2 tablets on all other days.       INR was 1.2 today

## 2014-06-12 DIAGNOSIS — Z0279 Encounter for issue of other medical certificate: Secondary | ICD-10-CM

## 2014-06-14 ENCOUNTER — Ambulatory Visit (HOSPITAL_COMMUNITY)
Admission: RE | Admit: 2014-06-14 | Discharge: 2014-06-14 | Disposition: A | Discharge status: Home / Self Care | Payer: Disability Insurance | Source: Ambulatory Visit | Attending: Family Medicine | Admitting: Family Medicine

## 2014-06-14 ENCOUNTER — Other Ambulatory Visit (HOSPITAL_COMMUNITY): Payer: Self-pay | Admitting: Family Medicine

## 2014-06-14 DIAGNOSIS — M5137 Other intervertebral disc degeneration, lumbosacral region: Secondary | ICD-10-CM | POA: Insufficient documentation

## 2014-06-14 DIAGNOSIS — M5136 Other intervertebral disc degeneration, lumbar region: Secondary | ICD-10-CM | POA: Insufficient documentation

## 2014-06-14 DIAGNOSIS — M5441 Lumbago with sciatica, right side: Secondary | ICD-10-CM

## 2014-06-14 DIAGNOSIS — I700 Atherosclerosis of aorta: Secondary | ICD-10-CM | POA: Diagnosis not present

## 2014-06-14 DIAGNOSIS — M5126 Other intervertebral disc displacement, lumbar region: Secondary | ICD-10-CM | POA: Insufficient documentation

## 2014-06-18 ENCOUNTER — Ambulatory Visit (INDEPENDENT_AMBULATORY_CARE_PROVIDER_SITE_OTHER): Payer: Self-pay | Admitting: Pharmacist

## 2014-06-18 VITALS — Wt 240.0 lb

## 2014-06-18 DIAGNOSIS — I739 Peripheral vascular disease, unspecified: Secondary | ICD-10-CM

## 2014-06-18 LAB — POCT INR: INR: 1.9

## 2014-06-18 MED ORDER — SITAGLIPTIN PHOSPHATE 100 MG PO TABS
100.0000 mg | ORAL_TABLET | Freq: Every day | ORAL | Status: DC
Start: 2014-06-18 — End: 2014-09-07

## 2014-06-18 NOTE — Patient Instructions (Signed)
Anticoagulation Dose Instructions as of 06/18/2014      [name] Tue Wed Thu Fri Sat   New Dose 7.5 mg 5 mg 7.5 mg 7.5 mg 7.5 mg 5 mg 7.5 mg    Description        Take 2 tablets today. Then continue current warfarin dose of 1 tablet on mondays and fridays.  Take 1 and 1/2 tablets on all other days.       INR was 1.9 today

## 2014-06-22 ENCOUNTER — Other Ambulatory Visit: Payer: Self-pay | Admitting: Family Medicine

## 2014-06-30 ENCOUNTER — Other Ambulatory Visit: Payer: Self-pay | Admitting: Family Medicine

## 2014-07-25 ENCOUNTER — Ambulatory Visit: Payer: Self-pay

## 2014-08-01 ENCOUNTER — Encounter: Payer: Self-pay | Admitting: Family

## 2014-08-01 ENCOUNTER — Ambulatory Visit (INDEPENDENT_AMBULATORY_CARE_PROVIDER_SITE_OTHER): Payer: Self-pay | Admitting: Pharmacist

## 2014-08-01 ENCOUNTER — Ambulatory Visit (INDEPENDENT_AMBULATORY_CARE_PROVIDER_SITE_OTHER): Payer: Self-pay | Admitting: Family

## 2014-08-01 VITALS — Wt 235.0 lb

## 2014-08-01 VITALS — BP 132/83 | HR 70 | Temp 97.2°F | Ht 70.0 in | Wt 235.0 lb

## 2014-08-01 DIAGNOSIS — F17200 Nicotine dependence, unspecified, uncomplicated: Secondary | ICD-10-CM

## 2014-08-01 DIAGNOSIS — Z720 Tobacco use: Secondary | ICD-10-CM

## 2014-08-01 DIAGNOSIS — K219 Gastro-esophageal reflux disease without esophagitis: Secondary | ICD-10-CM

## 2014-08-01 DIAGNOSIS — I10 Essential (primary) hypertension: Secondary | ICD-10-CM

## 2014-08-01 DIAGNOSIS — I739 Peripheral vascular disease, unspecified: Secondary | ICD-10-CM

## 2014-08-01 DIAGNOSIS — J069 Acute upper respiratory infection, unspecified: Secondary | ICD-10-CM

## 2014-08-01 DIAGNOSIS — L0291 Cutaneous abscess, unspecified: Secondary | ICD-10-CM

## 2014-08-01 DIAGNOSIS — J449 Chronic obstructive pulmonary disease, unspecified: Secondary | ICD-10-CM

## 2014-08-01 LAB — POCT INR: INR: 1.9

## 2014-08-01 MED ORDER — DOXYCYCLINE HYCLATE 100 MG PO TABS: 100.0000 mg | ORAL_TABLET | Freq: Two times a day (BID) | ORAL | Status: DC

## 2014-08-01 NOTE — Patient Instructions (Signed)
Anticoagulation Dose Instructions as of 08/01/2014      [name] Tue Wed Thu Fri Sat   New Dose 7.5 mg 5 mg 7.5 mg 7.5 mg 7.5 mg 5 mg 7.5 mg    Description        Take 2 tablets today. Then continue current warfarin dose of 1 tablet on mondays and fridays.  Take 1 and 1/2 tablets on all other days.       INR was 1.9 today

## 2014-08-01 NOTE — Progress Notes (Signed)
Subjective:    Patient ID: [name], female    DOB: February 07, 1961, 54 y.o.   MRN: [id number]  Pt presents to the office with a number of complaints. Pt states she has a chronic cough that has been going on for years.Pt states she is a chronic smoker 1 pack X 35 years. Pt states she is wanting to stop smoking within this year. Pt states she has nausea and vomiting everyday. States she has these nausea spells are worse in the morning. Pt states she has a hital hernia and she believes this is causing the nausea and pressure. Pt states she is also having leg pain . Pt states she ahs PVD and has had multiple surgeries on her right leg and has removed her main artery. However, this has not helped her pain. Pt states she does not have insurance right now and does not want to be started on any new medications at this time. Pt states she was on dexliant and it was helping with her GERD and N&V, but she can not afford it. Pt is in the process of applying for disability.    Cough This is a chronic problem. The current episode started more than 1 year ago. The problem has been waxing and waning. The problem occurs every few minutes. The cough is non-productive. Associated symptoms include heartburn, myalgias, nasal congestion, rhinorrhea, shortness of breath and wheezing. Pertinent negatives include no chills or headaches. The symptoms are aggravated by lying down. She has tried rest for the symptoms. Her past medical history is significant for COPD.  Gastrophageal Reflux She complains of coughing, heartburn, nausea and wheezing. This is a chronic problem. The current episode started more than 1 year ago. The problem occurs frequently. The problem has been waxing and waning. The heartburn duration is several minutes. Associated symptoms include fatigue. Risk factors include obesity, hiatal hernia and lack of exercise. She has tried a PPI for the symptoms. The treatment provided mild relief.      Review  of Systems  Constitutional: Positive for fatigue. Negative for chills.  HENT: Positive for rhinorrhea.   Eyes: Negative.   Respiratory: Positive for cough, shortness of breath and wheezing.   Cardiovascular: Positive for leg swelling. Negative for palpitations.  Gastrointestinal: Positive for heartburn and nausea.  Endocrine: Negative.   Genitourinary: Negative.   Musculoskeletal: Positive for myalgias and back pain.  Neurological: Negative.  Negative for headaches.  Hematological: Negative.   Psychiatric/Behavioral: Negative.   All other systems reviewed and are negative.      Objective:   Physical Exam  Constitutional: She is oriented to person, place, and time. She appears well-developed and well-nourished. No distress.  HENT:  Head: Normocephalic and atraumatic.  Right Ear: External ear normal.  Left Ear: External ear normal.  Mouth/Throat: Oropharynx is clear and moist.  Nasal passage erythemas with mild swelling  Oropharynx erythemas   Eyes: Pupils are equal, round, and reactive to light.  Neck: Normal range of motion. Neck supple. No thyromegaly present.  Cardiovascular: Normal rate, regular rhythm, normal heart sounds and intact distal pulses.   No murmur heard. Pulmonary/Chest: Effort normal. No respiratory distress. She has wheezes.  Coarse breath sounds in bilateral lungs   Abdominal: Soft. Bowel sounds are normal. She exhibits no distension. There is no tenderness.  Musculoskeletal: Normal range of motion. She exhibits no edema or tenderness.  Neurological: She is alert and oriented to person, place, and time. She has normal reflexes. No cranial nerve deficit.  Skin: Skin is warm and dry.  Bilateral legs discolored, right worse than left. Cool to touch.  Abscess under right arm   Psychiatric: She has a normal mood and affect. Her behavior is normal. Judgment and thought content normal.  Vitals reviewed.    BP 132/83 mmHg  Pulse 70  Temp(Src) 97.2 F  (36.2 C) (Oral)  Ht 5\' 10"  (1.778 m)  Wt 235 lb (106.595 kg)  BMI 33.72 kg/m2      Assessment & Plan:  1. Peripheral vascular occlusive disease  2. Essential hypertension  3. Chronic obstructive pulmonary disease, unspecified COPD, unspecified chronic bronchitis type  4. Gastroesophageal reflux disease, esophagitis presence not specified  5. SMOKER  6. Acute upper respiratory infection -- Take meds as prescribed - Use a cool mist humidifier  -Use saline nose sprays frequently -Saline irrigations of the nose can be very helpful if done frequently.  * 4X daily for 1 week*  * Use of a nettie pot can be helpful with this. Follow directions with this* -Force fluids -For any cough or congestion  Use plain Mucinex- regular strength or max strength is fine   * Children- consult with Pharmacist for dosing -For fever or aces or pains- take tylenol or ibuprofen appropriate for age and weight.  * for fevers greater than 101 orally you may alternate ibuprofen and tylenol every  3 hours. -Throat lozenges if help - doxycycline (VIBRA-TABS) 100 MG tablet; Take 1 tablet (100 mg total) by mouth 2 (two) times daily.  Dispense: 20 tablet; Refill: 0  7. Abscess -Warm compresses Do not pick!! - doxycycline (VIBRA-TABS) 100 MG tablet; Take 1 tablet (100 mg total) by mouth 2 (two) times daily.  Dispense: 20 tablet; Refill: 0   Continue all meds Smoking cessation discussed Diet and exercise encouraged RTO prn  Evelina Dun, FNP

## 2014-08-01 NOTE — Patient Instructions (Addendum)
Smoking Cessation Quitting smoking is important to your health and has many advantages. However, it is not always easy to quit since nicotine is a very addictive drug. Oftentimes, people try 3 times or more before being able to quit. This document explains the best ways for you to prepare to quit smoking. Quitting takes hard work and a lot of effort, but you can do it. ADVANTAGES OF QUITTING SMOKING  You will live longer, feel better, and live better.  Your body will feel the impact of quitting smoking almost immediately.  Within 20 minutes, blood pressure decreases. Your pulse returns to its normal level.  After 8 hours, carbon monoxide levels in the blood return to normal. Your oxygen level increases.  After 24 hours, the chance of having a heart attack starts to decrease. Your breath, hair, and body stop smelling like smoke.  After 48 hours, damaged nerve endings begin to recover. Your sense of taste and smell improve.  After 72 hours, the body is virtually free of nicotine. Your bronchial tubes relax and breathing becomes easier.  After 2 to 12 weeks, lungs can hold more air. Exercise becomes easier and circulation improves.  The risk of having a heart attack, stroke, cancer, or lung disease is greatly reduced.  After 1 year, the risk of coronary heart disease is cut in half.  After 5 years, the risk of stroke falls to the same as a nonsmoker.  After 10 years, the risk of lung cancer is cut in half and the risk of other cancers decreases significantly.  After 15 years, the risk of coronary heart disease drops, usually to the level of a nonsmoker.  If you are pregnant, quitting smoking will improve your chances of having a healthy baby.  The people you live with, especially any children, will be healthier.  You will have extra money to spend on things other than cigarettes. QUESTIONS TO THINK ABOUT BEFORE ATTEMPTING TO QUIT You may want to talk about your answers with your  health care provider.  Why do you want to quit?  If you tried to quit in the past, what helped and what did not?  What will be the most difficult situations for you after you quit? How will you plan to handle them?  Who can help you through the tough times? Your family? Friends? A health care provider?  What pleasures do you get from smoking? What ways can you still get pleasure if you quit? Here are some questions to ask your health care provider:  How can you help me to be successful at quitting?  What medicine do you think would be best for me and how should I take it?  What should I do if I need more help?  What is smoking withdrawal like? How can I get information on withdrawal? GET READY  Set a quit date.  Change your environment by getting rid of all cigarettes, ashtrays, matches, and lighters in your home, car, or work. Do not let people smoke in your home.  Review your past attempts to quit. Think about what worked and what did not. GET SUPPORT AND ENCOURAGEMENT You have a better chance of being successful if you have help. You can get support in many ways.  Tell your family, friends, and coworkers that you are going to quit and need their support. Ask them not to smoke around you.  Get individual, group, or telephone counseling and support. Programs are available at local hospitals and health centers. Call   your local health department for information about programs in your area.  Spiritual beliefs and practices may help some smokers quit.  Download a "quit meter" on your computer to keep track of quit statistics, such as how long you have gone without smoking, cigarettes not smoked, and money saved.  Get a self-help book about quitting smoking and staying off tobacco. [place] yourself from urges to smoke. Talk to someone, go for a walk, or occupy your time with a task.  Change your normal routine. Take a different route to work.  Drink tea instead of coffee. Eat breakfast in a different place.  Reduce your stress. Take a hot bath, exercise, or read a book.  Plan something enjoyable to do every day. Reward yourself for not smoking.  Explore interactive web-based programs that specialize in helping you quit. GET MEDICINE AND USE IT CORRECTLY Medicines can help you stop smoking and decrease the urge to smoke. Combining medicine with the above behavioral methods and support can greatly increase your chances of successfully quitting smoking.  Nicotine replacement therapy helps deliver nicotine to your body without the negative effects and risks of smoking. Nicotine replacement therapy includes nicotine gum, lozenges, inhalers, nasal sprays, and skin patches. Some may be available over-the-counter and others require a prescription.  Antidepressant medicine helps people abstain from smoking, but how this works is unknown. This medicine is available by prescription.  Nicotinic receptor partial agonist medicine simulates the effect of nicotine in your brain. This medicine is available by prescription. Ask your health care provider for advice about which medicines to use and how to use them based on your health history. Your health care provider will tell you what side effects to look out for if you choose to be on a medicine or therapy. Carefully read the information on the package. Do not use any other product containing nicotine while using a nicotine replacement product.  RELAPSE OR DIFFICULT SITUATIONS Most relapses occur within the first 3 months after quitting. Do not be discouraged if you start smoking again. Remember, most people try several times before finally quitting. You may have symptoms of withdrawal because your body is used to nicotine. You may crave cigarettes, be irritable, feel very hungry, cough often, get headaches, or have difficulty concentrating. The withdrawal symptoms are only temporary. They are strongest  when you first quit, but they will go away within 10-14 days. To reduce the chances of relapse, try to:  Avoid drinking alcohol. Drinking lowers your chances of successfully quitting.  Reduce the amount of caffeine you consume. Once you quit smoking, the amount of caffeine in your body increases and can give you symptoms, such as a rapid heartbeat, sweating, and anxiety.  Avoid smokers because they can make you want to smoke.  Do not let weight gain distract you. Many smokers will gain weight when they quit, usually less than 10 pounds. Eat a healthy diet and stay active. You can always lose the weight gained after you quit.  Find ways to improve your mood other than smoking. FOR MORE INFORMATION  www.smokefree.gov  Document Released: 05/19/2001 Document Revised: 10/09/2013 Document Reviewed: 09/03/2011 [facility] Patient Information 2015 [place], [place]. This information is not intended to replace advice given to you by your health care provider. Make sure you discuss any questions you have with your health care provider. Peripheral Vascular Disease Peripheral Vascular Disease (PVD), also called Peripheral Arterial Disease (PAD), is a circulation problem caused by cholesterol (atherosclerotic plaque)  deposits in the arteries. PVD commonly occurs in the lower extremities (legs) but it can occur in other areas of the body, such as your arms. The cholesterol buildup in the arteries reduces blood flow which can cause pain and other serious problems. The presence of PVD can place a person at risk for Coronary Artery Disease (CAD).  CAUSES  Causes of PVD can be many. It is usually associated with more than one risk factor such as:   High Cholesterol.  Smoking.  Diabetes.  Lack of exercise or inactivity.  High blood pressure (hypertension).  Obesity.  Family history. SYMPTOMS   When the lower extremities are affected, patients with PVD may experience:  Leg pain with exertion or physical  activity. This is called INTERMITTENT CLAUDICATION. This may present as cramping or numbness with physical activity. The location of the pain is associated with the level of blockage. For example, blockage at the abdominal level (distal abdominal aorta) may result in buttock or hip pain. Lower leg arterial blockage may result in calf pain.  As PVD becomes more severe, pain can develop with less physical activity.  In people with severe PVD, leg pain may occur at rest.  Other PVD signs and symptoms:  Leg numbness or weakness.  Coldness in the affected leg or foot, especially when compared to the other leg.  A change in leg color.  Patients with significant PVD are more prone to ulcers or sores on toes, feet or legs. These may take longer to heal or may reoccur. The ulcers or sores can become infected.  If signs and symptoms of PVD are ignored, gangrene may occur. This can result in the loss of toes or loss of an entire limb.  Not all leg pain is related to PVD. Other medical conditions can cause leg pain such as:  Blood clots (embolism) or Deep Vein Thrombosis.  Inflammation of the blood vessels (vasculitis).  Spinal stenosis. DIAGNOSIS  Diagnosis of PVD can involve several different types of tests. These can include:  Pulse Volume Recording Method (PVR). This test is simple, painless and does not involve the use of X-rays. PVR involves measuring and comparing the blood pressure in the arms and legs. An ABI (Ankle-Brachial Index) is calculated. The normal ratio of blood pressures is 1. As this number becomes smaller, it indicates more severe disease.  < 0.95 - indicates significant narrowing in one or more leg vessels.  <0.8 - there will usually be pain in the foot, leg or buttock with exercise.  <0.4 - will usually have pain in the legs at rest.  <0.25 - usually indicates limb threatening PVD.  Doppler detection of pulses in the legs. This test is painless and checks to see if  you have a pulses in your legs/feet.  A dye or contrast material (a substance that highlights the blood vessels so they show up on x-ray) may be given to help your caregiver better see the arteries for the following tests. The dye is eliminated from your body by the kidney's. Your caregiver may order blood work to check your kidney function and other laboratory values before the following tests are performed:  Magnetic Resonance Angiography (MRA). An MRA is a picture study of the blood vessels and arteries. The MRA machine uses a large magnet to produce images of the blood vessels.  Computed Tomography Angiography (CTA). A CTA is a specialized x-ray that looks at how the blood flows in your blood vessels. An IV may be inserted into  your arm so contrast dye can be injected.  Angiogram. Is a procedure that uses x-rays to look at your blood vessels. This procedure is minimally invasive, meaning a small incision (cut) is made in your groin. A small tube (catheter) is then inserted into the artery of your groin. The catheter is guided to the blood vessel or artery your caregiver wants to examine. Contrast dye is injected into the catheter. X-rays are then taken of the blood vessel or artery. After the images are obtained, the catheter is taken out. TREATMENT  Treatment of PVD involves many interventions which may include:  Lifestyle changes:  Quitting smoking.  Exercise.  Following a low fat, low cholesterol diet.  Control of diabetes.  Foot care is very important to the PVD patient. Good foot care can help prevent infection.  Medication:  Cholesterol-lowering medicine.  Blood pressure medicine.  Anti-platelet drugs.  Certain medicines may reduce symptoms of Intermittent Claudication.  Interventional/Surgical options:  Angioplasty. An Angioplasty is a procedure that inflates a balloon in the blocked artery. This opens the blocked artery to improve blood flow.  Stent Implant. A wire  mesh tube (stent) is placed in the artery. The stent expands and stays in place, allowing the artery to remain open.  Peripheral Bypass Surgery. This is a surgical procedure that reroutes the blood around a blocked artery to help improve blood flow. This type of procedure may be performed if Angioplasty or stent implants are not an option. SEEK IMMEDIATE MEDICAL CARE IF:   You develop pain or numbness in your arms or legs.  Your arm or leg turns cold, becomes blue in color.  You develop redness, warmth, swelling and pain in your arms or legs. MAKE SURE YOU:   Understand these instructions.  Will watch your condition.  Will get help right away if you are not doing well or get worse. Document Released: 07/02/2004 Document Revised: 08/17/2011 Document Reviewed: 05/29/2008 [facility] Patient Information 2015 [place], [place]. This information is not intended to replace advice given to you by your health care provider. Make sure you discuss any questions you have with your health care provider. Gastroesophageal Reflux Disease, Adult Gastroesophageal reflux disease (GERD) happens when acid from your stomach flows up into the esophagus. When acid comes in contact with the esophagus, the acid causes soreness (inflammation) in the esophagus. Over time, GERD may create small holes (ulcers) in the lining of the esophagus. CAUSES   Increased body weight. This puts pressure on the stomach, making acid rise from the stomach into the esophagus.  Smoking. This increases acid production in the stomach.  Drinking alcohol. This causes decreased pressure in the lower esophageal sphincter (valve or ring of muscle between the esophagus and stomach), allowing acid from the stomach into the esophagus.  Late evening meals and a full stomach. This increases pressure and acid production in the stomach.  A malformed lower esophageal sphincter. Sometimes, no cause is found. SYMPTOMS   Burning pain in the lower  part of the mid-chest behind the breastbone and in the mid-stomach area. This may occur twice a week or more often.  Trouble swallowing.  Sore throat.  Dry cough.  Asthma-like symptoms including chest tightness, shortness of breath, or wheezing. DIAGNOSIS  Your caregiver may be able to diagnose GERD based on your symptoms. In some cases, X-rays and other tests may be done to check for complications or to check the condition of your stomach and esophagus. TREATMENT  Your caregiver may recommend over-the-counter or prescription  medicines to help decrease acid production. Ask your caregiver before starting or adding any new medicines.  HOME CARE INSTRUCTIONS   Change the factors that you can control. Ask your caregiver for guidance concerning weight loss, quitting smoking, and alcohol consumption.  Avoid foods and drinks that make your symptoms worse, such as:  Caffeine or alcoholic drinks.  Chocolate.  Peppermint or mint flavorings.  Garlic and onions.  Spicy foods.  Citrus fruits, such as oranges, lemons, or limes.  Tomato-based foods such as sauce, chili, salsa, and pizza.  Fried and fatty foods.  Avoid lying down for the 3 hours prior to your bedtime or prior to taking a nap.  Eat small, frequent meals instead of large meals.  Wear loose-fitting clothing. Do not wear anything tight around your waist that causes pressure on your stomach.  Raise the head of your bed 6 to 8 inches with wood blocks to help you sleep. Extra pillows will not help.  Only take over-the-counter or prescription medicines for pain, discomfort, or fever as directed by your caregiver.  Do not take aspirin, ibuprofen, or other nonsteroidal anti-inflammatory drugs (NSAIDs). SEEK IMMEDIATE MEDICAL CARE IF:   You have pain in your arms, neck, jaw, teeth, or back.  Your pain increases or changes in intensity or duration.  You develop nausea, vomiting, or sweating (diaphoresis).  You develop  shortness of breath, or you faint.  Your vomit is green, yellow, black, or looks like coffee grounds or blood.  Your stool is red, bloody, or black. These symptoms could be signs of other problems, such as heart disease, gastric bleeding, or esophageal bleeding. MAKE SURE YOU:   Understand these instructions.  Will watch your condition.  Will get help right away if you are not doing well or get worse. Document Released: 03/04/2005 Document Revised: 08/17/2011 Document Reviewed: 12/12/2010 [facility] Patient Information 2015 [place], [place]. This information is not intended to replace advice given to you by your health care provider. Make sure you discuss any questions you have with your health care provider. Food Choices for Gastroesophageal Reflux Disease When you have gastroesophageal reflux disease (GERD), the foods you eat and your eating habits are very important. Choosing the right foods can help ease the discomfort of GERD. WHAT GENERAL GUIDELINES DO I NEED TO FOLLOW?  Choose fruits, vegetables, whole grains, low-fat dairy products, and low-fat meat, fish, and poultry.  Limit fats such as oils, salad dressings, butter, nuts, and avocado.  Keep a food diary to identify foods that cause symptoms.  Avoid foods that cause reflux. These may be different for different people.  Eat frequent small meals instead of three large meals each day.  Eat your meals slowly, in a relaxed setting.  Limit fried foods.  Cook foods using methods other than frying.  Avoid drinking alcohol.  Avoid drinking large amounts of liquids with your meals.  Avoid bending over or lying down until 2-3 hours after eating. WHAT FOODS ARE NOT RECOMMENDED? The following are some foods and drinks that may worsen your symptoms: Vegetables Tomatoes. Tomato juice. Tomato and spaghetti sauce. Chili peppers. Onion and garlic. Horseradish. Fruits Oranges, grapefruit, and lemon (fruit and juice). Meats High-fat  meats, fish, and poultry. This includes hot dogs, ribs, ham, sausage, salami, and bacon. Dairy Whole milk and chocolate milk. Sour cream. Cream. Butter. Ice cream. Cream cheese.  Beverages Coffee and tea, with or without caffeine. Carbonated beverages or energy drinks. Condiments Hot sauce. Barbecue sauce.  Sweets/Desserts Chocolate and cocoa. Donuts. Peppermint  and spearmint. Fats and Oils High-fat foods, including [place] fries and potato chips. Other Vinegar. Strong spices, such as black pepper, white pepper, red pepper, cayenne, curry powder, cloves, ginger, and chili powder. The items listed above may not be a complete list of foods and beverages to avoid. Contact your dietitian for more information. Document Released: 05/25/2005 Document Revised: 05/30/2013 Document Reviewed: 03/29/2013 [facility] Patient Information 2015 [place], [place]. This information is not intended to replace advice given to you by your health care provider. Make sure you discuss any questions you have with your health care provider.

## 2014-08-01 NOTE — Progress Notes (Signed)
No charge for this visit because patient is coming back this afternoon to see Pam Dun, NP for nausea / cough / possible sinus infection.

## 2014-08-13 ENCOUNTER — Other Ambulatory Visit: Payer: Self-pay | Admitting: Family Medicine

## 2014-08-20 ENCOUNTER — Other Ambulatory Visit: Payer: Self-pay | Admitting: Family

## 2014-08-28 ENCOUNTER — Other Ambulatory Visit: Payer: Self-pay | Admitting: Family Medicine

## 2014-09-07 ENCOUNTER — Other Ambulatory Visit: Payer: Self-pay | Admitting: Family Medicine

## 2014-09-07 ENCOUNTER — Telehealth: Payer: Self-pay | Admitting: Family

## 2014-09-07 MED ORDER — SITAGLIPTIN PHOSPHATE 100 MG PO TABS: 100.0000 mg | ORAL_TABLET | Freq: Every day | ORAL | Status: DC

## 2014-09-07 NOTE — Telephone Encounter (Signed)
[email or web address] that samples were at front desk to pick up

## 2014-09-11 DIAGNOSIS — Z0279 Encounter for issue of other medical certificate: Secondary | ICD-10-CM | POA: Diagnosis not present

## 2014-09-13 ENCOUNTER — Ambulatory Visit (INDEPENDENT_AMBULATORY_CARE_PROVIDER_SITE_OTHER): Payer: Self-pay | Admitting: Family

## 2014-09-13 ENCOUNTER — Encounter: Payer: Self-pay | Admitting: Family

## 2014-09-13 VITALS — BP 100/60 | HR 72 | Temp 97.2°F | Ht 70.0 in | Wt 236.4 lb

## 2014-09-13 DIAGNOSIS — E669 Obesity, unspecified: Secondary | ICD-10-CM

## 2014-09-13 DIAGNOSIS — E785 Hyperlipidemia, unspecified: Secondary | ICD-10-CM

## 2014-09-13 DIAGNOSIS — L0291 Cutaneous abscess, unspecified: Secondary | ICD-10-CM

## 2014-09-13 DIAGNOSIS — K449 Diaphragmatic hernia without obstruction or gangrene: Secondary | ICD-10-CM

## 2014-09-13 DIAGNOSIS — J449 Chronic obstructive pulmonary disease, unspecified: Secondary | ICD-10-CM

## 2014-09-13 DIAGNOSIS — F17200 Nicotine dependence, unspecified, uncomplicated: Secondary | ICD-10-CM

## 2014-09-13 DIAGNOSIS — I10 Essential (primary) hypertension: Secondary | ICD-10-CM

## 2014-09-13 DIAGNOSIS — E039 Hypothyroidism, unspecified: Secondary | ICD-10-CM

## 2014-09-13 DIAGNOSIS — E1152 Type 2 diabetes mellitus with diabetic peripheral angiopathy with gangrene: Secondary | ICD-10-CM

## 2014-09-13 DIAGNOSIS — K219 Gastro-esophageal reflux disease without esophagitis: Secondary | ICD-10-CM

## 2014-09-13 DIAGNOSIS — I739 Peripheral vascular disease, unspecified: Secondary | ICD-10-CM

## 2014-09-13 DIAGNOSIS — Z720 Tobacco use: Secondary | ICD-10-CM

## 2014-09-13 LAB — POCT GLYCOSYLATED HEMOGLOBIN (HGB A1C): Hemoglobin A1C: 6.3

## 2014-09-13 MED ORDER — SULFAMETHOXAZOLE-TRIMETHOPRIM 800-160 MG PO TABS: 1.0000 | ORAL_TABLET | Freq: Two times a day (BID) | ORAL | Status: DC

## 2014-09-13 MED ORDER — SITAGLIPTIN PHOSPHATE 100 MG PO TABS: 100.0000 mg | ORAL_TABLET | Freq: Every day | ORAL | Status: DC

## 2014-09-13 NOTE — Patient Instructions (Signed)

## 2014-09-13 NOTE — Progress Notes (Signed)
Subjective:    Patient ID: [name], female    DOB: 1960-06-29, 54 y.o.   MRN: [id number]  Gastrophageal Reflux She complains of nausea. This is a chronic problem. The current episode started more than 1 year ago. The problem occurs frequently. The problem has been waxing and waning. The heartburn duration is several minutes. Associated symptoms include fatigue. Risk factors include obesity, hiatal hernia and lack of exercise. She has tried a PPI for the symptoms. The treatment provided mild relief.  Diabetes She presents for her follow-up diabetic visit. She has type 2 diabetes mellitus. Her disease course has been worsening. Pertinent negatives for hypoglycemia include no confusion, dizziness or headaches. Associated symptoms include blurred vision, fatigue, foot paresthesias and visual change. Pertinent negatives for diabetes include no foot ulcerations. Pertinent negatives for hypoglycemia complications include no blackouts and no hospitalization. Symptoms are worsening. Diabetic complications include peripheral neuropathy and PVD. Pertinent negatives for diabetic complications include no CVA or nephropathy. Risk factors for coronary artery disease include diabetes mellitus, dyslipidemia, hypertension, obesity, post-menopausal and family history. Current diabetic treatment includes oral agent (dual therapy). She is following a generally healthy diet. Her breakfast blood glucose range is generally 140-180 mg/dl. An ACE inhibitor/angiotensin II receptor blocker is being taken. Eye exam is current.  Hypertension The current episode started more than 1 year ago. The problem has been resolved since onset. The problem is controlled. Associated symptoms include blurred vision and shortness of breath. Pertinent negatives include no headaches, palpitations or peripheral edema. Risk factors for coronary artery disease include diabetes mellitus, dyslipidemia, family history, obesity,  post-menopausal state and sedentary lifestyle. Past treatments include diuretics and angiotensin blockers. The current treatment provides significant improvement. Hypertensive end-organ damage includes PVD. There is no history of kidney disease, CAD/MI, CVA, heart failure or a thyroid problem. There is no history of sleep apnea.  Hyperlipidemia This is a chronic problem. The current episode started more than 1 year ago. The problem is controlled. Recent lipid tests were reviewed and are normal. Exacerbating diseases include diabetes and hypothyroidism. Factors aggravating her hyperlipidemia include smoking. Associated symptoms include shortness of breath. Pertinent negatives include no leg pain. Current antihyperlipidemic treatment includes statins. The current treatment provides significant improvement of lipids. Risk factors for coronary artery disease include diabetes mellitus, dyslipidemia, family history, hypertension, obesity and post-menopausal.  Thyroid Problem Presents for follow-up visit. Symptoms include constipation, diarrhea, dry skin, fatigue and visual change. Patient reports no depressed mood, heat intolerance, menstrual problem or palpitations. The symptoms have been stable. Past treatments include levothyroxine. The treatment provided significant relief. Her past medical history is significant for diabetes and hyperlipidemia. There is no history of heart failure.  COPD Pt does not have insurance and states she can not afford her inhaler. Pt is a current smoker, but states she is cutting back and currently smoking half a pack a day.     Review of Systems  Constitutional: Positive for fatigue.  HENT: Negative.   Eyes: Positive for blurred vision.  Respiratory: Positive for shortness of breath.   Cardiovascular: Negative.  Negative for palpitations.  Gastrointestinal: Positive for nausea, diarrhea and constipation.  Endocrine: Negative.  Negative for heat intolerance.  Genitourinary:  Negative.  Negative for menstrual problem.  Musculoskeletal: Negative.   Neurological: Negative.  Negative for dizziness and headaches.  Hematological: Negative.   Psychiatric/Behavioral: Negative.  Negative for confusion.  All other systems reviewed and are negative.      Objective:   Physical Exam  Constitutional:  She is oriented to person, place, and time. She appears well-developed and well-nourished. No distress.  HENT:  Head: Normocephalic and atraumatic.  Right Ear: External ear normal.  Left Ear: External ear normal.  Nose: Nose normal.  Mouth/Throat: Oropharynx is clear and moist.  Eyes: Pupils are equal, round, and reactive to light.  Neck: Normal range of motion. Neck supple. No thyromegaly present.  Cardiovascular: Normal rate, regular rhythm, normal heart sounds and intact distal pulses.   No murmur heard. Pulmonary/Chest: Effort normal and breath sounds normal. No respiratory distress. She has no wheezes.  Abdominal: Soft. Bowel sounds are normal. She exhibits no distension. There is no tenderness.  Musculoskeletal: Normal range of motion. She exhibits no edema or tenderness.  Neurological: She is alert and oriented to person, place, and time. She has normal reflexes. No cranial nerve deficit.  Skin: Skin is warm and dry.  Psychiatric: She has a normal mood and affect. Her behavior is normal. Judgment and thought content normal.  Vitals reviewed.   See Diabetic foot note  BP 100/60 mmHg  Pulse 72  Temp(Src) 97.2 F (36.2 C) (Oral)  Ht 5' 10"  (1.778 m)  Wt 236 lb 6.4 oz (107.23 kg)  BMI 33.92 kg/m2     Assessment & Plan:  1. Essential hypertension - CMP14+EGFR  2. Peripheral vascular occlusive disease - CMP14+EGFR  3. Hernia, hiatal - CMP14+EGFR  4. Chronic obstructive pulmonary disease, unspecified COPD, unspecified chronic bronchitis type - CMP14+EGFR  5. Gastroesophageal reflux disease, esophagitis presence not specified - CMP14+EGFR  6. Type  2 diabetes mellitus with diabetic peripheral angiopathy with gangrene - POCT glycosylated hemoglobin (Hb A1C) - CMP14+EGFR  7. Hypothyroidism, unspecified hypothyroidism type - CMP14+EGFR  8. Hyperlipidemia - CMP14+EGFR  9. SMOKER - CMP14+EGFR  10. Obesity (BMI 30-39.9) - CMP14+EGFR  11. Abscess -Warm Compresses - sulfamethoxazole-trimethoprim (BACTRIM DS,SEPTRA DS) 800-160 MG per tablet; Take 1 tablet by mouth 2 (two) times daily.  Dispense: 20 tablet; Refill: 0   Continue all meds Labs pending Health Maintenance reviewed Diet and exercise encouraged RTO 3 months  Evelina Dun, FNP

## 2014-09-14 ENCOUNTER — Other Ambulatory Visit: Payer: Self-pay | Admitting: Family Medicine

## 2014-09-14 LAB — CMP14+EGFR
A/G RATIO: 1.4 (ref 1.1–2.5)
ALK PHOS: 55 IU/L (ref 39–117)
ALT: 16 IU/L (ref 0–32)
AST: 19 IU/L (ref 0–40)
Albumin: 4 g/dL (ref 3.5–5.5)
BILIRUBIN TOTAL: 0.2 mg/dL (ref 0.0–1.2)
BUN / CREAT RATIO: 19 (ref 9–23)
BUN: 14 mg/dL (ref 6–24)
CHLORIDE: 100 mmol/L (ref 97–108)
CO2: 25 mmol/L (ref 18–29)
Calcium: 9.8 mg/dL (ref 8.7–10.2)
Creatinine, Ser: 0.75 mg/dL (ref 0.57–1.00)
GFR, EST AFRICAN AMERICAN: 106 mL/min/{1.73_m2} (ref >59)
GFR, EST NON AFRICAN AMERICAN: 92 mL/min/{1.73_m2} (ref >59)
GLUCOSE: 94 mg/dL (ref 65–99)
Globulin, Total: 2.9 g/dL (ref 1.5–4.5)
Potassium: 4.2 mmol/L (ref 3.5–5.2)
Sodium: 142 mmol/L (ref 134–144)
TOTAL PROTEIN: 6.9 g/dL (ref 6.0–8.5)

## 2014-09-20 ENCOUNTER — Ambulatory Visit (INDEPENDENT_AMBULATORY_CARE_PROVIDER_SITE_OTHER): Payer: Self-pay | Admitting: Pharmacist

## 2014-09-20 DIAGNOSIS — I739 Peripheral vascular disease, unspecified: Secondary | ICD-10-CM

## 2014-09-20 LAB — POCT INR: INR: 2.6

## 2014-09-20 NOTE — Patient Instructions (Signed)
Anticoagulation Dose Instructions as of 09/20/2014      [name] Tue Wed Thu Fri Sat   New Dose 7.5 mg 5 mg 7.5 mg 7.5 mg 7.5 mg 5 mg 7.5 mg    Description        Take 1 tablet everyday while taking Bactrim and then continue current warfarin dose of 1 tablet on mondays and fridays.  Take 1 and 1/2 tablets on all other days.

## 2014-11-01 ENCOUNTER — Ambulatory Visit (INDEPENDENT_AMBULATORY_CARE_PROVIDER_SITE_OTHER): Payer: Self-pay | Admitting: Pharmacist

## 2014-11-01 ENCOUNTER — Other Ambulatory Visit: Payer: Self-pay | Admitting: Family Medicine

## 2014-11-01 DIAGNOSIS — I739 Peripheral vascular disease, unspecified: Secondary | ICD-10-CM

## 2014-11-01 LAB — POCT INR: INR: 1.2

## 2014-11-01 MED ORDER — UMECLIDINIUM BROMIDE 62.5 MCG/INH IN AEPB: 1.0000 | INHALATION_SPRAY | Freq: Every day | RESPIRATORY_TRACT | Status: DC

## 2014-11-01 NOTE — Addendum Note (Signed)
Addended by: Cherre Robins on: 11/01/2014 03:26 PM   Modules accepted: Orders

## 2014-11-01 NOTE — Patient Instructions (Signed)
Anticoagulation Dose Instructions as of 11/01/2014      [name] Tue Wed Thu Fri Sat   New Dose 7.5 mg 5 mg 7.5 mg 7.5 mg 7.5 mg 7.5 mg 7.5 mg    Description        Take 2 tablets for next 2 days, then increase dose to 1 and 1/2 tablets except 1 tablet mondays.     INR was 1.2 (too low / thick)

## 2014-11-22 ENCOUNTER — Other Ambulatory Visit (INDEPENDENT_AMBULATORY_CARE_PROVIDER_SITE_OTHER): Payer: Self-pay

## 2014-11-22 ENCOUNTER — Other Ambulatory Visit: Payer: Self-pay | Admitting: Family Medicine

## 2014-11-22 DIAGNOSIS — I739 Peripheral vascular disease, unspecified: Secondary | ICD-10-CM

## 2014-11-22 LAB — POCT INR: INR: 2.2

## 2014-11-22 NOTE — Patient Instructions (Addendum)
Continue current dose of  1 and 1/2 tablets daily except 1 tablet mondays.

## 2014-11-22 NOTE — Patient Instructions (Signed)
Continue current dose of  1 and 1/2 tablets daily except 1 tablet mondays.

## 2014-12-17 ENCOUNTER — Encounter: Payer: Self-pay | Admitting: Family

## 2014-12-17 ENCOUNTER — Ambulatory Visit (INDEPENDENT_AMBULATORY_CARE_PROVIDER_SITE_OTHER): Payer: Self-pay | Admitting: Family

## 2014-12-17 VITALS — BP 120/67 | HR 76 | Temp 97.3°F | Ht 70.0 in | Wt 234.4 lb

## 2014-12-17 DIAGNOSIS — E785 Hyperlipidemia, unspecified: Secondary | ICD-10-CM

## 2014-12-17 DIAGNOSIS — K219 Gastro-esophageal reflux disease without esophagitis: Secondary | ICD-10-CM

## 2014-12-17 DIAGNOSIS — F17200 Nicotine dependence, unspecified, uncomplicated: Secondary | ICD-10-CM

## 2014-12-17 DIAGNOSIS — I10 Essential (primary) hypertension: Secondary | ICD-10-CM

## 2014-12-17 DIAGNOSIS — I739 Peripheral vascular disease, unspecified: Secondary | ICD-10-CM

## 2014-12-17 DIAGNOSIS — Z720 Tobacco use: Secondary | ICD-10-CM

## 2014-12-17 DIAGNOSIS — E039 Hypothyroidism, unspecified: Secondary | ICD-10-CM

## 2014-12-17 DIAGNOSIS — E669 Obesity, unspecified: Secondary | ICD-10-CM

## 2014-12-17 DIAGNOSIS — J449 Chronic obstructive pulmonary disease, unspecified: Secondary | ICD-10-CM

## 2014-12-17 DIAGNOSIS — E1152 Type 2 diabetes mellitus with diabetic peripheral angiopathy with gangrene: Secondary | ICD-10-CM

## 2014-12-17 LAB — POCT GLYCOSYLATED HEMOGLOBIN (HGB A1C): Hemoglobin A1C: 6.3

## 2014-12-17 MED ORDER — CANAGLIFLOZIN 100 MG PO TABS: 100.0000 mg | ORAL_TABLET | Freq: Every day | ORAL | Status: DC

## 2014-12-17 NOTE — Progress Notes (Signed)
Subjective:    Patient ID: [name], female    DOB: 05-Feb-1961, 54 y.o.   MRN: [id number]   Pt presents to the office for chronic follow up.  Diabetes She presents for her follow-up diabetic visit. She has type 2 diabetes mellitus. Her disease course has been worsening. Pertinent negatives for hypoglycemia include no confusion, dizziness or headaches. Associated symptoms include blurred vision, fatigue, foot paresthesias and visual change. Pertinent negatives for diabetes include no foot ulcerations. Pertinent negatives for hypoglycemia complications include no blackouts and no hospitalization. Symptoms are worsening. Diabetic complications include peripheral neuropathy and PVD. Pertinent negatives for diabetic complications include no CVA or nephropathy. Risk factors for coronary artery disease include diabetes mellitus, dyslipidemia, hypertension, obesity, post-menopausal and family history. Current diabetic treatment includes oral agent (dual therapy). She is following a generally healthy diet. Her breakfast blood glucose range is generally 140-180 mg/dl. An ACE inhibitor/angiotensin II receptor blocker is being taken. Eye exam is current.  Gastrophageal Reflux She complains of nausea. She reports no belching, no choking, no heartburn or no sore throat. This is a chronic problem. The current episode started more than 1 year ago. The problem occurs frequently. The problem has been waxing and waning. The heartburn duration is several minutes. Associated symptoms include fatigue. Risk factors include obesity, hiatal hernia and lack of exercise. She has tried a PPI for the symptoms. The treatment provided mild relief.  Hypertension This is a chronic problem. The current episode started more than 1 year ago. The problem has been resolved since onset. The problem is controlled. Associated symptoms include blurred vision and shortness of breath. Pertinent negatives include no headaches,  palpitations or peripheral edema. Risk factors for coronary artery disease include diabetes mellitus, dyslipidemia, family history, obesity, post-menopausal state and sedentary lifestyle. Past treatments include diuretics and angiotensin blockers. The current treatment provides significant improvement. Hypertensive end-organ damage includes PVD. There is no history of kidney disease, CAD/MI, CVA, heart failure or a thyroid problem. There is no history of sleep apnea.  Hyperlipidemia This is a chronic problem. The current episode started more than 1 year ago. The problem is controlled. Recent lipid tests were reviewed and are normal. Exacerbating diseases include diabetes and hypothyroidism. Factors aggravating her hyperlipidemia include smoking. Associated symptoms include shortness of breath. Pertinent negatives include no leg pain. Current antihyperlipidemic treatment includes diet change. The current treatment provides significant improvement of lipids. Risk factors for coronary artery disease include diabetes mellitus, dyslipidemia, family history, hypertension, obesity, post-menopausal and stress.  Thyroid Problem Presents for follow-up visit. Symptoms include constipation, diarrhea, dry skin, fatigue and visual change. Patient reports no depressed mood, heat intolerance, menstrual problem or palpitations. The symptoms have been stable. Past treatments include levothyroxine. The treatment provided significant relief. Her past medical history is significant for diabetes and hyperlipidemia. There is no history of heart failure.  COPD Pt currently taking Incruse Ellipta every other day because of cost of rx, but states she can tell a "huge difference" in her breathing when she uses it. Pt states she is down to 10 cigarettes a day and hopes to continue decreasing.     Review of Systems  Constitutional: Positive for fatigue.  HENT: Negative.  Negative for sore throat.   Eyes: Positive for blurred  vision.  Respiratory: Positive for shortness of breath. Negative for choking.   Cardiovascular: Negative.  Negative for palpitations.  Gastrointestinal: Positive for nausea, diarrhea and constipation. Negative for heartburn.  Endocrine: Negative.  Negative for heat intolerance.  Genitourinary: Negative.  Negative for menstrual problem.  Musculoskeletal: Negative.   Neurological: Negative.  Negative for dizziness and headaches.  Hematological: Negative.   Psychiatric/Behavioral: Negative.  Negative for confusion.  All other systems reviewed and are negative.      Objective:   Physical Exam  Constitutional: She is oriented to person, place, and time. She appears well-developed and well-nourished. No distress.  HENT:  Head: Normocephalic and atraumatic.  Right Ear: External ear normal.  Left Ear: External ear normal.  Nose: Nose normal.  Mouth/Throat: Oropharynx is clear and moist.  Eyes: Pupils are equal, round, and reactive to light.  Neck: Normal range of motion. Neck supple. No thyromegaly present.  Cardiovascular: Normal rate, regular rhythm, normal heart sounds and intact distal pulses.   No murmur heard. Pulmonary/Chest: Effort normal and breath sounds normal. No respiratory distress. She has no wheezes.  Abdominal: Soft. Bowel sounds are normal. She exhibits no distension. There is no tenderness.  Musculoskeletal: Normal range of motion. She exhibits no edema or tenderness.  Neurological: She is alert and oriented to person, place, and time. She has normal reflexes. No cranial nerve deficit.  Skin: Skin is warm and dry.  Psychiatric: She has a normal mood and affect. Her behavior is normal. Judgment and thought content normal.  Vitals reviewed.     BP 120/67 mmHg  Pulse 76  Temp(Src) 97.3 F (36.3 C) (Oral)  Ht 5' 10"  (1.778 m)  Wt 234 lb 6.4 oz (106.323 kg)  BMI 33.63 kg/m2     Assessment & Plan:  1. Essential hypertension - CMP14+EGFR  2. Peripheral vascular  occlusive disease - CMP14+EGFR  3. Chronic obstructive pulmonary disease, unspecified COPD, unspecified chronic bronchitis type - CMP14+EGFR  4. Gastroesophageal reflux disease, esophagitis presence not specified - CMP14+EGFR  5. Type 2 diabetes mellitus with diabetic peripheral angiopathy with gangrene -We do not have samples of Januvia for pt- Pt states she can not afford medication -Pt started on Invokana 100 mg today- Samples given to pt - POCT glycosylated hemoglobin (Hb A1C) - POCT UA - Microalbumin - CMP14+EGFR - canagliflozin (INVOKANA) 100 MG TABS tablet; Take 1 tablet (100 mg total) by mouth daily.  Dispense: 30 tablet; Refill: 1  6. Hyperlipidemia -PT not taking Crestor because she states she can not afford it - CMP14+EGFR - Lipid panel  7. SMOKER -Smoking cessation discussed - CMP14+EGFR  8. Obesity (BMI 30-39.9) - CMP14+EGFR  9. Hypothyroidism, unspecified hypothyroidism type - Thyroid Panel With TSH  *Pt does not have insurance and can not afford all of her medications.  Continue all meds Labs pending Health Maintenance reviewed Diet and exercise encouraged RTO 3 months  Evelina Dun, FNP

## 2014-12-17 NOTE — Patient Instructions (Signed)

## 2014-12-18 ENCOUNTER — Telehealth: Payer: Self-pay | Admitting: *Deleted

## 2014-12-18 ENCOUNTER — Other Ambulatory Visit: Payer: Self-pay | Admitting: Family

## 2014-12-18 LAB — LIPID PANEL
Chol/HDL Ratio: 7.8 ratio units — ABNORMAL HIGH (ref 0.0–4.4)
Cholesterol, Total: 259 mg/dL — ABNORMAL HIGH (ref 100–199)
HDL: 33 mg/dL — AB (ref >39)
LDL CALC: 181 mg/dL — AB (ref 0–99)
Triglycerides: 227 mg/dL — ABNORMAL HIGH (ref 0–149)
VLDL Cholesterol Cal: 45 mg/dL — ABNORMAL HIGH (ref 5–40)

## 2014-12-18 LAB — CMP14+EGFR
ALBUMIN: 3.9 g/dL (ref 3.5–5.5)
ALK PHOS: 57 IU/L (ref 39–117)
ALT: 17 IU/L (ref 0–32)
AST: 19 IU/L (ref 0–40)
Albumin/Globulin Ratio: 1.3 (ref 1.1–2.5)
BUN / CREAT RATIO: 16 (ref 9–23)
BUN: 13 mg/dL (ref 6–24)
Bilirubin Total: <0.2 mg/dL (ref 0.0–1.2)
CO2: 26 mmol/L (ref 18–29)
Calcium: 9.8 mg/dL (ref 8.7–10.2)
Chloride: 102 mmol/L (ref 97–108)
Creatinine, Ser: 0.8 mg/dL (ref 0.57–1.00)
GFR calc Af Amer: 98 mL/min/{1.73_m2} (ref >59)
GFR calc non Af Amer: 85 mL/min/{1.73_m2} (ref >59)
Globulin, Total: 3.1 g/dL (ref 1.5–4.5)
Glucose: 109 mg/dL — ABNORMAL HIGH (ref 65–99)
Potassium: 4.8 mmol/L (ref 3.5–5.2)
SODIUM: 143 mmol/L (ref 134–144)
TOTAL PROTEIN: 7 g/dL (ref 6.0–8.5)

## 2014-12-18 LAB — THYROID PANEL WITH TSH
Free Thyroxine Index: 2.1 (ref 1.2–4.9)
T3 UPTAKE RATIO: 29 % (ref 24–39)
T4, Total: 7.2 ug/dL (ref 4.5–12.0)
TSH: 1.84 u[IU]/mL (ref 0.450–4.500)

## 2014-12-18 MED ORDER — ATORVASTATIN CALCIUM 40 MG PO TABS: 40.0000 mg | ORAL_TABLET | Freq: Every day | ORAL | Status: DC

## 2014-12-18 NOTE — Telephone Encounter (Signed)
-----   Message from Sharion Balloon, [place] sent at 12/18/2014 12:25 PM EDT ----- HgbA1C WNL Kidney and liver function stable Cholesterol levels elevated- Lipitor rx sent to pharmacy, pt needs to be on low fat diet Thyroid levels WNL

## 2014-12-18 NOTE — Telephone Encounter (Signed)
Pt notified of results Verbalizes understanding 

## 2014-12-27 ENCOUNTER — Encounter: Payer: Self-pay | Admitting: *Deleted

## 2015-01-08 ENCOUNTER — Other Ambulatory Visit: Payer: Self-pay | Admitting: Family

## 2015-01-08 ENCOUNTER — Telehealth: Payer: Self-pay | Admitting: Family

## 2015-01-09 MED ORDER — LOSARTAN POTASSIUM 50 MG PO TABS: 50.0000 mg | ORAL_TABLET | Freq: Every day | ORAL | Status: DC

## 2015-01-09 NOTE — Telephone Encounter (Signed)
Patient currently does not have insurance and needs cheaper alternative to Benicar.  Changed to losartan 50mg  - take 1 tablet daily for BP Rx sent to [place].

## 2015-01-18 ENCOUNTER — Encounter: Payer: Self-pay | Admitting: Family

## 2015-01-24 ENCOUNTER — Telehealth: Payer: Self-pay | Admitting: Pharmacist

## 2015-01-24 NOTE — Telephone Encounter (Signed)
Patient called b/c follow up needed to recheck INR - appt made for 01/28/15

## 2015-01-25 ENCOUNTER — Other Ambulatory Visit: Payer: Self-pay | Admitting: Family

## 2015-01-28 ENCOUNTER — Ambulatory Visit (INDEPENDENT_AMBULATORY_CARE_PROVIDER_SITE_OTHER): Payer: Self-pay | Admitting: Pharmacist

## 2015-01-28 DIAGNOSIS — E1152 Type 2 diabetes mellitus with diabetic peripheral angiopathy with gangrene: Secondary | ICD-10-CM

## 2015-01-28 DIAGNOSIS — I739 Peripheral vascular disease, unspecified: Secondary | ICD-10-CM

## 2015-01-28 LAB — POCT INR: INR: 1.3

## 2015-01-28 MED ORDER — CANAGLIFLOZIN 100 MG PO TABS: 100.0000 mg | ORAL_TABLET | Freq: Every day | ORAL | Status: DC

## 2015-01-28 NOTE — Patient Instructions (Signed)
Anticoagulation Dose Instructions as of 01/28/2015      [name] Tue Wed Thu Fri Sat   New Dose 7.5 mg 7.5 mg 7.5 mg 7.5 mg 7.5 mg 7.5 mg 7.5 mg    Description        Take 2 tablets today - August 22nd and August 23rd.  Then increase to warfarin 5mg  take 1 and 1/2 tablets daily     INR was 1.3 today

## 2015-01-28 NOTE — Progress Notes (Signed)
Subjective:     Indication: DVT and PVD Bleeding signs/symptoms: None Thromboembolic signs/symptoms: None  Missed Coumadin doses: This week - missed 2 doses Medication changes: yes - crestor changed to atorvastatin 40mg  daily.  Patient is concerned about this change because in past she thinks that atorvastin did not get her cholesterol down enough and when she was on 80mg  she was told that this would damage her liver.  Although sh is prescribed 40mg  daily she is only taking 20mg  daily Dietary changes: no Bacterial/viral infection: no Other concerns: no  The following portions of the patient's history were reviewed and updated as appropriate: allergies, current medications, past family history, past medical history, past social history, past surgical history and problem list.  Objective:    INR Today: 1.3 Current dose: 7.5mg  daily except 5mg  on mondays.     Assessment:    Subtherapeutic INR for goal of 2-3   Plan:    1. New dose: take 10mg  of warfarin for next 2 days, then increase dose to 7.5mg  daily.  Disucssed ways in improve compliance.    2. Next INR: recommended recheck in 1 weeks but patient is going to be out of town for 2-3 weeks.  She also refuses to come in sooner because she is cash pay.   3.  Encouraged patient to restart atrovastatin at 40mg  daily.  We will continue to monitor LFTs and lipids.   4.  Patient also given #10 invokana 100mg  and rx for #30 with a coupon for #30 free.    Cherre Robins, PharmD, CPP

## 2015-02-06 ENCOUNTER — Encounter: Payer: Self-pay | Admitting: *Deleted

## 2015-02-11 ENCOUNTER — Other Ambulatory Visit: Payer: Self-pay | Admitting: Family Medicine

## 2015-03-11 ENCOUNTER — Encounter: Payer: Self-pay | Admitting: Family Medicine

## 2015-03-11 ENCOUNTER — Ambulatory Visit (INDEPENDENT_AMBULATORY_CARE_PROVIDER_SITE_OTHER): Payer: Self-pay | Admitting: Family Medicine

## 2015-03-11 ENCOUNTER — Other Ambulatory Visit: Payer: Self-pay | Admitting: Family

## 2015-03-11 VITALS — BP 122/78 | HR 86 | Temp 97.8°F | Ht 70.0 in | Wt 235.0 lb

## 2015-03-11 DIAGNOSIS — I739 Peripheral vascular disease, unspecified: Secondary | ICD-10-CM

## 2015-03-11 DIAGNOSIS — R05 Cough: Secondary | ICD-10-CM

## 2015-03-11 DIAGNOSIS — J449 Chronic obstructive pulmonary disease, unspecified: Secondary | ICD-10-CM

## 2015-03-11 DIAGNOSIS — E1152 Type 2 diabetes mellitus with diabetic peripheral angiopathy with gangrene: Secondary | ICD-10-CM

## 2015-03-11 DIAGNOSIS — R300 Dysuria: Secondary | ICD-10-CM

## 2015-03-11 DIAGNOSIS — R059 Cough, unspecified: Secondary | ICD-10-CM

## 2015-03-11 DIAGNOSIS — L0390 Cellulitis, unspecified: Secondary | ICD-10-CM

## 2015-03-11 LAB — POCT UA - MICROSCOPIC ONLY
CASTS, UR, LPF, POC: NEGATIVE
CRYSTALS, UR, HPF, POC: NEGATIVE
MUCUS UA: NEGATIVE
RBC, URINE, MICROSCOPIC: NEGATIVE
Yeast, UA: NEGATIVE

## 2015-03-11 LAB — POCT URINALYSIS DIPSTICK
GLUCOSE UA: NEGATIVE
KETONES UA: NEGATIVE
Leukocytes, UA: NEGATIVE
Nitrite, UA: NEGATIVE
RBC UA: NEGATIVE
SPEC GRAV UA: 1.015
Urobilinogen, UA: NEGATIVE
pH, UA: 7.5

## 2015-03-11 LAB — POCT INR: INR: 1.4

## 2015-03-11 MED ORDER — SULFAMETHOXAZOLE-TRIMETHOPRIM 800-160 MG PO TABS: 1.0000 | ORAL_TABLET | Freq: Two times a day (BID) | ORAL | Status: DC

## 2015-03-11 NOTE — Patient Instructions (Signed)
Take Septra DS 1 twice daily for infection until completed. Be sure and take this with food. Return to clinic in 7-10 days to get a repeat pro time Use mupirocin ointment on sores on legs twice daily after cleaning with warm soapy water Drink plenty of fluids Use Mucinex maximum strength, blue and white in color, 1 twice daily for cough and congestion and use nasal saline frequently in each nostril through the day

## 2015-03-11 NOTE — Progress Notes (Signed)
Subjective:    Patient ID: [name], female    DOB: [birth year], 55 y.o.   MRN: [id number]  HPI Patient here today for possible URI and UTI. The patient has a history of cough and congestion. This is worse early in the morning. She is also complaining with some painful voiding and frequency. She is also concerned about a bug bite on her left leg. She is requesting refills on her Plavix and Lexapro. The area on her leg she thinks is a bug bite from the beach. She recently had a course of Cipro for urinary tract infection. She still having the pain and frequency with voiding. She is still smoking. She still coughing. She is not been running any fever.    Patient Active Problem List   Diagnosis Date Noted  . Hypothyroidism 01/29/2014  . Obesity (BMI 30-39.9) 08/16/2013  . Impetigo 07/28/2013  . COPD (chronic obstructive pulmonary disease) ([place])   . Uterine cancer ([place])   . Peripheral vascular occlusive disease ([place]) 05/17/2013  . GERD (gastroesophageal reflux disease) 10/18/2012  . Hernia, hiatal 10/18/2012  . Diabetes mellitus, type 2 ([place]) 04/18/2008  . Hyperlipidemia 04/18/2008  . SMOKER 04/18/2008  . Essential hypertension 04/18/2008   Outpatient Encounter Prescriptions as of 03/11/2015  Medication Sig  . albuterol (PROVENTIL HFA;VENTOLIN HFA) 108 (90 BASE) MCG/ACT inhaler Inhale 2 puffs into the lungs every 6 (six) hours as needed for wheezing or shortness of breath.  Marland Kitchen atorvastatin (LIPITOR) 40 MG tablet Take 1 tablet (40 mg total) by mouth daily. (Patient taking differently: Take 20 mg by mouth daily. )  . clopidogrel (PLAVIX) 75 MG tablet TAKE 1 TABLET DAILY  . escitalopram (LEXAPRO) 20 MG tablet TAKE 1 TABLET ONCE A DAY  . furosemide (LASIX) 20 MG tablet TAKE 1 TABLET DAILY  . Glucose Blood (BLOOD GLUCOSE TEST STRIPS) STRP One touch ultra 2 test strips.  Use to check BG up to twice a day.  Dx:  250.02  . levothyroxine (SYNTHROID, LEVOTHROID) 100 MCG tablet Take 1  tablet (100 mcg total) by mouth daily before breakfast.  . losartan (COZAAR) 50 MG tablet Take 1 tablet (50 mg total) by mouth daily.  . metFORMIN (GLUCOPHAGE-XR) 500 MG 24 hr tablet TAKE 2 TABLET ONCE DAILY WITH BREAKFAST (Patient taking differently: take 1 tablet qd)  . mupirocin ointment (BACTROBAN) 2 % APPLY TO THE NOSE AS DIRECTED 2 TIMES A DAY  . omeprazole (PRILOSEC) 20 MG capsule TAKE (1) CAPSULE DAILY  . Potassium Gluconate 595 MG CAPS Take 1 capsule by mouth daily.  Marland Kitchen Umeclidinium Bromide (INCRUSE ELLIPTA) 62.5 MCG/INH AEPB Inhale 1 puff into the lungs daily.  . Vitamin D, Ergocalciferol, (DRISDOL) 50000 UNITS CAPS capsule Take 50,000 Units by mouth every 7 (seven) days.  Marland Kitchen warfarin (COUMADIN) 5 MG tablet TAKE AS DIRECTED UP TO 1 1/2 TABLETS A DAY  . [DISCONTINUED] canagliflozin (INVOKANA) 100 MG TABS tablet Take 1 tablet (100 mg total) by mouth daily.   No facility-administered encounter medications on file as of 03/11/2015.      Review of Systems  Constitutional: Negative.   HENT: Positive for congestion (nasal - early AM worse).   Eyes: Negative.   Respiratory: Positive for cough (early in AM).   Cardiovascular: Negative.   Gastrointestinal: Negative.   Endocrine: Negative.   Genitourinary: Positive for dysuria and frequency.  Musculoskeletal: Negative.   Skin: Negative.        Left leg - bug bite  Allergic/Immunologic: Negative.   Neurological:  Negative.   Hematological: Negative.   Psychiatric/Behavioral: Negative.        Objective:   Physical Exam  Constitutional: She is oriented to person, place, and time. She appears well-developed and well-nourished. No distress.  Older appearing than her stated age but very alert  HENT:  Head: Normocephalic and atraumatic.  Right Ear: External ear normal.  Left Ear: External ear normal.  Mouth/Throat: Oropharynx is clear and moist. No oropharyngeal exudate.  Eyes: Conjunctivae and EOM are normal. Pupils are equal, round,  and reactive to light. Right eye exhibits no discharge. Left eye exhibits no discharge. No scleral icterus.  Neck: Normal range of motion. Neck supple. No thyromegaly present.  Cardiovascular: Normal rate, regular rhythm and normal heart sounds.  Exam reveals no gallop and no friction rub.   No murmur heard. At 84/m  Pulmonary/Chest: Effort normal. No respiratory distress. She has no wheezes. She has no rales.  Rhonchi with coughing  Abdominal: Soft. Bowel sounds are normal. She exhibits no mass. There is no tenderness. There is no rebound and no guarding.  No suprapubic tenderness just abdominal obesity without masses.  Musculoskeletal: Normal range of motion. She exhibits no edema or tenderness.  Lymphadenopathy:    She has no cervical adenopathy.  Neurological: She is alert and oriented to person, place, and time.  Skin: Skin is warm and dry. Rash noted. There is erythema.  Multiple sores on both legs the worst is on the right posterior thigh about a quarter size.  Psychiatric: She has a normal mood and affect. Her behavior is normal. Judgment and thought content normal.  Nursing note and vitals reviewed.   BP 122/78 mmHg  Pulse 86  Temp(Src) 97.8 F (36.6 C) (Oral)  Ht 5\' 10"  (1.778 m)  Wt 235 lb (106.595 kg)  BMI 33.72 kg/m2  Results for orders placed or performed in visit on 03/11/15  POCT UA - Microscopic Only  Result Value Ref Range   WBC, Ur, HPF, POC 1-3    RBC, urine, microscopic neg    Bacteria, U Microscopic occ    Mucus, UA neg    Epithelial cells, urine per micros many    Crystals, Ur, HPF, POC neg    Casts, Ur, LPF, POC neg    Yeast, UA neg   POCT urinalysis dipstick  Result Value Ref Range   Color, UA gold    Clarity, UA clear    Glucose, UA neg    Bilirubin, UA small    Ketones, UA neg    Spec Grav, UA 1.015    Blood, UA neg    pH, UA 7.5    Protein, UA 30+    Urobilinogen, UA negative    Nitrite, UA neg    Leukocytes, UA Negative Negative  POCT  INR  Result Value Ref Range   INR 1.4          Assessment & Plan:  1. Dysuria -Take antibiotic as directed - POCT UA - Microscopic Only - POCT urinalysis dipstick - Urine culture  2. Peripheral vascular occlusive disease (HCC) - POCT INR  3. Type 2 diabetes mellitus with diabetic peripheral angiopathy and gangrene, without long-term current use of insulin (HCC)  4. Cellulitis of skin -Take Septra DS 1 twice daily for infection until completed -Use mupirocin ointment twice daily after cleansing with warm soapy water  5. Chronic obstructive pulmonary disease, unspecified COPD, unspecified chronic bronchitis type Take Mucinex maximum strength 1 twice daily with a large glass of water  for cough and congestion and use nasal saline frequently in each nostril  6. Cough -Take Mucinex for cough and congestion and drink plenty of fluids  Meds ordered this encounter  Medications  . sulfamethoxazole-trimethoprim (BACTRIM DS,SEPTRA DS) 800-160 MG tablet    Sig: Take 1 tablet by mouth 2 (two) times daily.    Dispense:  20 tablet    Refill:  0   Patient Instructions  Take Septra DS 1 twice daily for infection until completed. Be sure and take this with food. Return to clinic in 7-10 days to get a repeat pro time Use mupirocin ointment on sores on legs twice daily after cleaning with warm soapy water Drink plenty of fluids Use Mucinex maximum strength, blue and white in color, 1 twice daily for cough and congestion and use nasal saline frequently in each nostril through the day    Arrie Senate MD

## 2015-03-13 LAB — URINE CULTURE

## 2015-03-19 ENCOUNTER — Other Ambulatory Visit: Payer: Self-pay | Admitting: Family Medicine

## 2015-03-19 ENCOUNTER — Other Ambulatory Visit: Payer: Self-pay | Admitting: Family

## 2015-03-20 ENCOUNTER — Ambulatory Visit: Payer: Self-pay | Admitting: Family

## 2015-03-21 ENCOUNTER — Ambulatory Visit (INDEPENDENT_AMBULATORY_CARE_PROVIDER_SITE_OTHER): Payer: Self-pay | Admitting: Family

## 2015-03-21 ENCOUNTER — Encounter: Payer: Self-pay | Admitting: Family

## 2015-03-21 VITALS — BP 140/87 | HR 68 | Temp 97.1°F | Ht 70.0 in | Wt 233.0 lb

## 2015-03-21 DIAGNOSIS — I10 Essential (primary) hypertension: Secondary | ICD-10-CM

## 2015-03-21 DIAGNOSIS — N63 Unspecified lump in breast: Secondary | ICD-10-CM

## 2015-03-21 DIAGNOSIS — J449 Chronic obstructive pulmonary disease, unspecified: Secondary | ICD-10-CM

## 2015-03-21 DIAGNOSIS — T63304D Toxic effect of unspecified spider venom, undetermined, subsequent encounter: Secondary | ICD-10-CM

## 2015-03-21 DIAGNOSIS — E039 Hypothyroidism, unspecified: Secondary | ICD-10-CM

## 2015-03-21 DIAGNOSIS — F17200 Nicotine dependence, unspecified, uncomplicated: Secondary | ICD-10-CM

## 2015-03-21 DIAGNOSIS — E1159 Type 2 diabetes mellitus with other circulatory complications: Secondary | ICD-10-CM

## 2015-03-21 DIAGNOSIS — E1152 Type 2 diabetes mellitus with diabetic peripheral angiopathy with gangrene: Secondary | ICD-10-CM

## 2015-03-21 DIAGNOSIS — E785 Hyperlipidemia, unspecified: Secondary | ICD-10-CM

## 2015-03-21 DIAGNOSIS — K641 Second degree hemorrhoids: Secondary | ICD-10-CM

## 2015-03-21 DIAGNOSIS — E669 Obesity, unspecified: Secondary | ICD-10-CM

## 2015-03-21 DIAGNOSIS — I739 Peripheral vascular disease, unspecified: Secondary | ICD-10-CM

## 2015-03-21 DIAGNOSIS — N6320 Unspecified lump in the left breast, unspecified quadrant: Secondary | ICD-10-CM

## 2015-03-21 DIAGNOSIS — K219 Gastro-esophageal reflux disease without esophagitis: Secondary | ICD-10-CM

## 2015-03-21 LAB — POCT INR: INR: 3.2

## 2015-03-21 LAB — POCT GLYCOSYLATED HEMOGLOBIN (HGB A1C): Hemoglobin A1C: 6.8

## 2015-03-21 NOTE — Progress Notes (Signed)
Subjective:    Patient ID: [name], female    DOB: [birth year], 54 y.o.   MRN: [id number]  Pt presents to the office today chronic follow up. Pt was diagnosed with a UTI,  acute bronchitis, and a spider bite on 03/11/15 and started on 10 days bactrim. Pt states she is on her "last  Day". Pt states the spider bite is improved but is still erythemas.  Diabetes She presents for her follow-up diabetic visit. She has type 2 diabetes mellitus. Her disease course has been worsening. Pertinent negatives for hypoglycemia include no confusion, dizziness or headaches. Associated symptoms include blurred vision, fatigue, foot paresthesias and visual change. Pertinent negatives for diabetes include no foot ulcerations. Pertinent negatives for hypoglycemia complications include no blackouts and no hospitalization. Symptoms are worsening. Diabetic complications include peripheral neuropathy and PVD. Pertinent negatives for diabetic complications include no CVA, heart disease or nephropathy. Risk factors for coronary artery disease include diabetes mellitus, dyslipidemia, hypertension, obesity, post-menopausal and family history. Current diabetic treatment includes oral agent (dual therapy). She is following a generally healthy diet. Her breakfast blood glucose range is generally 140-180 mg/dl. An ACE inhibitor/angiotensin II receptor blocker is being taken. Eye exam is current.  Hypertension This is a chronic problem. The current episode started more than 1 year ago. The problem has been resolved since onset. The problem is controlled. Associated symptoms include blurred vision and shortness of breath. Pertinent negatives include no headaches, palpitations or peripheral edema. Risk factors for coronary artery disease include diabetes mellitus, dyslipidemia, family history, obesity, post-menopausal state and sedentary lifestyle. Past treatments include diuretics and angiotensin blockers. The current  treatment provides significant improvement. Hypertensive end-organ damage includes PVD. There is no history of kidney disease, CAD/MI, CVA, heart failure or a thyroid problem. There is no history of sleep apnea.  Gastrophageal Reflux She complains of nausea. She reports no belching, no choking, no heartburn or no sore throat. This is a chronic problem. The current episode started more than 1 year ago. The problem occurs frequently. The problem has been waxing and waning. The heartburn duration is several minutes. Associated symptoms include fatigue. Risk factors include obesity, hiatal hernia and lack of exercise. She has tried a PPI for the symptoms. The treatment provided mild relief.  Hyperlipidemia This is a chronic problem. The current episode started more than 1 year ago. The problem is controlled. Recent lipid tests were reviewed and are normal. Exacerbating diseases include diabetes and hypothyroidism. Factors aggravating her hyperlipidemia include smoking. Associated symptoms include shortness of breath. Pertinent negatives include no leg pain. Current antihyperlipidemic treatment includes diet change. The current treatment provides significant improvement of lipids. Risk factors for coronary artery disease include diabetes mellitus, dyslipidemia, family history, hypertension, obesity, post-menopausal and stress.  Thyroid Problem Presents for follow-up visit. Symptoms include constipation, diarrhea, dry skin, fatigue and visual change. Patient reports no depressed mood, heat intolerance, menstrual problem or palpitations. The symptoms have been stable. Past treatments include levothyroxine. The treatment provided significant relief. Her past medical history is significant for diabetes and hyperlipidemia. There is no history of heart failure.      Review of Systems  Constitutional: Positive for fatigue.  HENT: Negative.  Negative for sore throat.   Eyes: Positive for blurred vision.    Respiratory: Positive for shortness of breath. Negative for choking.   Cardiovascular: Negative.  Negative for palpitations.  Gastrointestinal: Positive for nausea, diarrhea and constipation. Negative for heartburn.  Endocrine: Negative.  Negative for heat intolerance.  Genitourinary: Negative.  Negative for menstrual problem.  Musculoskeletal: Negative.   Neurological: Negative.  Negative for dizziness and headaches.  Hematological: Negative.   Psychiatric/Behavioral: Negative.  Negative for confusion.  All other systems reviewed and are negative.      Objective:   Physical Exam  Constitutional: She is oriented to person, place, and time. She appears well-developed and well-nourished. No distress.  HENT:  Head: Normocephalic and atraumatic.  Right Ear: External ear normal.  Left Ear: External ear normal.  Nose: Nose normal.  Mouth/Throat: Oropharynx is clear and moist.  Eyes: Pupils are equal, round, and reactive to light.  Neck: Normal range of motion. Neck supple. No thyromegaly present.  Cardiovascular: Normal rate, regular rhythm, normal heart sounds and intact distal pulses.   No murmur heard. Pulmonary/Chest: Effort normal and breath sounds normal. No respiratory distress. She has no wheezes. Right breast exhibits no skin change and no tenderness. Left breast exhibits mass (left outer breast). Left breast exhibits no tenderness.  Abdominal: Soft. Bowel sounds are normal. She exhibits no distension. There is no tenderness.  Musculoskeletal: Normal range of motion. She exhibits no edema or tenderness.  Neurological: She is alert and oriented to person, place, and time. She has normal reflexes. No cranial nerve deficit.  Skin: Skin is warm and dry. Lesion (1.5 cm X 2 Cm erythemas lesion) noted.  Psychiatric: She has a normal mood and affect. Her behavior is normal. Judgment and thought content normal.  Vitals reviewed.    BP 140/87 mmHg  Pulse 68  Temp(Src) 97.1 F (36.2  C) (Oral)  Ht 5' 10"  (1.778 m)  Wt 233 lb (105.688 kg)  BMI 33.43 kg/m2      Assessment & Plan:  1. Peripheral vascular occlusive disease (HCC) - POCT INR  2. Type 2 diabetes mellitus with diabetic peripheral angiopathy and gangrene, without long-term current use of insulin ([place])  3. Type 2 diabetes mellitus with other circulatory complications (HCC) - [id number] - POCT glycosylated hemoglobin (Hb A1C)  4. Hyperlipidemia  5. Essential hypertension  6. Chronic obstructive pulmonary disease, unspecified COPD type ([place])  7. Gastroesophageal reflux disease, esophagitis presence not specified  8. Hypothyroidism, unspecified hypothyroidism type  9. SMOKER  10. Obesity (BMI 30-39.9)  11. Spider bite wound, undetermined intent, subsequent encounter -Pt told to keep close eye -Keep would clean and dry -If redness increases or drainage or fever RTO   12. Second degree hemorrhoids -Continue with suppository   13. Mass of breast, left -Pt to call [facility] about getting assists with a mammogram since she is self pay!!  INR discussed- Pt to only take 5 mg (1 Tablet) tomorrow, then 7.5 mg daily- Pt to follow up with Tammy in 1 week  Continue all meds Labs pending Health Maintenance reviewed- Pt refuses all vaccines at this time since she is self-pay Diet and exercise encouraged RTO 3 months  Evelina Dun, FNP

## 2015-03-21 NOTE — Patient Instructions (Addendum)
Menopause is a normal process in which your reproductive ability comes to an end. This process happens gradually over a span of months to years, usually between the ages of 48 and 55. Menopause is complete when you have missed 12 consecutive menstrual periods. It is important to talk with your health care provider about some of the most common conditions that affect postmenopausal women, such as heart disease, cancer, and bone loss (osteoporosis). Adopting a healthy lifestyle and getting preventive care can help to promote your health and wellness. Those actions can also lower your chances of developing some of these common conditions. WHAT SHOULD I KNOW ABOUT MENOPAUSE? During menopause, you may experience a number of symptoms, such as:  Moderate-to-severe hot flashes.  Night sweats.  Decrease in sex drive.  Mood swings.  Headaches.  Tiredness.  Irritability.  Memory problems.  Insomnia. Choosing to treat or not to treat menopausal changes is an individual decision that you make with your health care provider. WHAT SHOULD I KNOW ABOUT HORMONE REPLACEMENT THERAPY AND SUPPLEMENTS? Hormone therapy products are effective for treating symptoms that are associated with menopause, such as hot flashes and night sweats. Hormone replacement carries certain risks, especially as you become older. If you are thinking about using estrogen or estrogen with progestin treatments, discuss the benefits and risks with your health care provider. WHAT SHOULD I KNOW ABOUT HEART DISEASE AND STROKE? Heart disease, heart attack, and stroke become more likely as you age. This may be due, in part, to the hormonal changes that your body experiences during menopause. These can affect how your body processes dietary fats, triglycerides, and cholesterol. Heart attack and stroke are both medical emergencies. There are many things that you can do to help prevent heart disease and stroke:  Have your blood pressure  checked at least every 1-2 years. High blood pressure causes heart disease and increases the risk of stroke.  If you are 55-79 years old, ask your health care provider if you should take aspirin to prevent a heart attack or a stroke.  Do not use any tobacco products, including cigarettes, chewing tobacco, or electronic cigarettes. If you need help quitting, ask your health care provider.  It is important to eat a healthy diet and maintain a healthy weight.  Be sure to include plenty of vegetables, fruits, low-fat dairy products, and lean protein.  Avoid eating foods that are high in solid fats, added sugars, or salt (sodium).  Get regular exercise. This is one of the most important things that you can do for your health.  Try to exercise for at least 150 minutes each week. The type of exercise that you do should increase your heart rate and make you sweat. This is known as moderate-intensity exercise.  Try to do strengthening exercises at least twice each week. Do these in addition to the moderate-intensity exercise.  Know your numbers.Ask your health care provider to check your cholesterol and your blood glucose. Continue to have your blood tested as directed by your health care provider. WHAT SHOULD I KNOW ABOUT CANCER SCREENING? There are several types of cancer. Take the following steps to reduce your risk and to catch any cancer development as early as possible. Breast Cancer  Practice breast self-awareness.  This means understanding how your breasts normally appear and feel.  It also means doing regular breast self-exams. Let your health care provider know about any changes, no matter how small.  If you are 40 or older, have a clinician do a   breast exam (clinical breast exam or CBE) every year. Depending on your age, family history, and medical history, it may be recommended that you also have a yearly breast X-ray (mammogram).  If you have a family history of breast cancer,  talk with your health care provider about genetic screening.  If you are at high risk for breast cancer, talk with your health care provider about having an MRI and a mammogram every year.  Breast cancer (BRCA) gene test is recommended for women who have family members with BRCA-related cancers. Results of the assessment will determine the need for genetic counseling and BRCA1 and for BRCA2 testing. BRCA-related cancers include these types:  Breast. This occurs in males or females.  Ovarian.  Tubal. This may also be called fallopian tube cancer.  Cancer of the abdominal or pelvic lining (peritoneal cancer).  Prostate.  Pancreatic. Cervical, Uterine, and Ovarian Cancer Your health care provider may recommend that you be screened regularly for cancer of the pelvic organs. These include your ovaries, uterus, and vagina. This screening involves a pelvic exam, which includes checking for microscopic changes to the surface of your cervix (Pap test).  For women ages 21-65, health care providers may recommend a pelvic exam and a Pap test every three years. For women ages 77-65, they may recommend the Pap test and pelvic exam, combined with testing for human papilloma virus (HPV), every five years. Some types of HPV increase your risk of cervical cancer. Testing for HPV may also be done on women of any age who have unclear Pap test results.  Other health care providers may not recommend any screening for nonpregnant women who are considered low risk for pelvic cancer and have no symptoms. Ask your health care provider if a screening pelvic exam is right for you.  If you have had past treatment for cervical cancer or a condition that could lead to cancer, you need Pap tests and screening for cancer for at least 20 years after your treatment. If Pap tests have been discontinued for you, your risk factors (such as having a new sexual partner) need to be reassessed to determine if you should start having  screenings again. Some women have medical problems that increase the chance of getting cervical cancer. In these cases, your health care provider may recommend that you have screening and Pap tests more often.  If you have a family history of uterine cancer or ovarian cancer, talk with your health care provider about genetic screening.  If you have vaginal bleeding after reaching menopause, tell your health care provider.  There are currently no reliable tests available to screen for ovarian cancer. Lung Cancer Lung cancer screening is recommended for adults 3-70 years old who are at high risk for lung cancer because of a history of smoking. A yearly low-dose CT scan of the lungs is recommended if you:  Currently smoke.  Have a history of at least 30 pack-years of smoking and you currently smoke or have quit within the past 15 years. A pack-year is smoking an average of one pack of cigarettes per day for one year. Yearly screening should:  Continue until it has been 15 years since you quit.  Stop if you develop a health problem that would prevent you from having lung cancer treatment. Colorectal Cancer  This type of cancer can be detected and can often be prevented.  Routine colorectal cancer screening usually begins at age 38 and continues through age 12.  If you have  risk factors for colon cancer, your health care provider may recommend that you be screened at an earlier age.  If you have a family history of colorectal cancer, talk with your health care provider about genetic screening.  Your health care provider may also recommend using home test kits to check for hidden blood in your stool.  A small camera at the end of a tube can be used to examine your colon directly (sigmoidoscopy or colonoscopy). This is done to check for the earliest forms of colorectal cancer.  Direct examination of the colon should be repeated every 5-10 years until age 67. However, if early forms of  precancerous polyps or small growths are found or if you have a family history or genetic risk for colorectal cancer, you may need to be screened more often. Skin Cancer  Check your skin from head to toe regularly.  Monitor any moles. Be sure to tell your health care provider:  About any new moles or changes in moles, especially if there is a change in a mole's shape or color.  If you have a mole that is larger than the size of a pencil eraser.  If any of your family members has a history of skin cancer, especially at a young age, talk with your health care provider about genetic screening.  Always use sunscreen. Apply sunscreen liberally and repeatedly throughout the day.  Whenever you are outside, protect yourself by wearing long sleeves, pants, a wide-brimmed hat, and sunglasses. WHAT SHOULD I KNOW ABOUT OSTEOPOROSIS? Osteoporosis is a condition in which bone destruction happens more quickly than new bone creation. After menopause, you may be at an increased risk for osteoporosis. To help prevent osteoporosis or the bone fractures that can happen because of osteoporosis, the following is recommended:  If you are 39-61 years old, get at least 1,000 mg of calcium and at least 600 mg of vitamin D per day.  If you are older than age 16 but younger than age 7, get at least 1,200 mg of calcium and at least 600 mg of vitamin D per day.  If you are older than age 47, get at least 1,200 mg of calcium and at least 800 mg of vitamin D per day. Smoking and excessive alcohol intake increase the risk of osteoporosis. Eat foods that are rich in calcium and vitamin D, and do weight-bearing exercises several times each week as directed by your health care provider. WHAT SHOULD I KNOW ABOUT HOW MENOPAUSE AFFECTS [name]? Depression may occur at any age, but it is more common as you become older. Common symptoms of depression include:  Low or sad mood.  Changes in sleep patterns.  Changes  in appetite or eating patterns.  Feeling an overall lack of motivation or enjoyment of activities that you previously enjoyed.  Frequent crying spells. Talk with your health care provider if you think that you are experiencing depression. WHAT SHOULD I KNOW ABOUT IMMUNIZATIONS? It is important that you get and maintain your immunizations. These include:  Tetanus, diphtheria, and pertussis (Tdap) booster vaccine.  Influenza every year before the flu season begins.  Pneumonia vaccine.  Shingles vaccine. Your health care provider may also recommend other immunizations.   This information is not intended to replace advice given to you by your health care provider. Make sure you discuss any questions you have with your health care provider.   Document Released: 07/17/2005 Document Revised: 06/15/2014 Document Reviewed: 01/25/2014 Elsevier Interactive Patient Education 2016 Elsevier  Inc. Hemorrhoids Hemorrhoids are swollen veins around the rectum or anus. There are two types of hemorrhoids:   Internal hemorrhoids. These occur in the veins just inside the rectum. They may poke through to the outside and become irritated and painful.  External hemorrhoids. These occur in the veins outside the anus and can be felt as a painful swelling or hard lump near the anus. CAUSES  Pregnancy.   Obesity.   Constipation or diarrhea.   Straining to have a bowel movement.   Sitting for long periods on the toilet.  Heavy lifting or other activity that caused you to strain.  Anal intercourse. SYMPTOMS   Pain.   Anal itching or irritation.   Rectal bleeding.   Fecal leakage.   Anal swelling.   One or more lumps around the anus.  DIAGNOSIS  Your caregiver may be able to diagnose hemorrhoids by visual examination. Other examinations or tests that may be performed include:   Examination of the rectal area with a gloved hand (digital rectal exam).   Examination of anal canal  using a small tube (scope).   A blood test if you have lost a significant amount of blood.  A test to look inside the colon (sigmoidoscopy or colonoscopy). TREATMENT Most hemorrhoids can be treated at home. However, if symptoms do not seem to be getting better or if you have a lot of rectal bleeding, your caregiver may perform a procedure to help make the hemorrhoids get smaller or remove them completely. Possible treatments include:   Placing a rubber band at the base of the hemorrhoid to cut off the circulation (rubber band ligation).   Injecting a chemical to shrink the hemorrhoid (sclerotherapy).   Using a tool to burn the hemorrhoid (infrared light therapy).   Surgically removing the hemorrhoid (hemorrhoidectomy).   Stapling the hemorrhoid to block blood flow to the tissue (hemorrhoid stapling).  HOME CARE INSTRUCTIONS   Eat foods with fiber, such as whole grains, beans, nuts, fruits, and vegetables. Ask your doctor about taking products with added fiber in them (fibersupplements).  Increase fluid intake. Drink enough water and fluids to keep your urine clear or pale yellow.   Exercise regularly.   Go to the bathroom when you have the urge to have a bowel movement. Do not wait.   Avoid straining to have bowel movements.   Keep the anal area dry and clean. Use wet toilet paper or moist towelettes after a bowel movement.   Medicated creams and suppositories may be used or applied as directed.   Only take over-the-counter or prescription medicines as directed by your caregiver.   Take warm sitz baths for 15-20 minutes, 3-4 times a day to ease pain and discomfort.   Place ice packs on the hemorrhoids if they are tender and swollen. Using ice packs between sitz baths may be helpful.   Put ice in a plastic bag.   Place a towel between your skin and the bag.   Leave the ice on for 15-20 minutes, 3-4 times a day.   Do not use a donut-shaped pillow or sit  on the toilet for long periods. This increases blood pooling and pain.  SEEK MEDICAL CARE IF:  You have increasing pain and swelling that is not controlled by treatment or medicine.  You have uncontrolled bleeding.  You have difficulty or you are unable to have a bowel movement.  You have pain or inflammation outside the area of the hemorrhoids. MAKE SURE YOU:  Understand  these instructions.  Will watch your condition.  Will get help right away if you are not doing well or get worse.   This information is not intended to replace advice given to you by your health care provider. Make sure you discuss any questions you have with your health care provider.   Document Released: 05/22/2000 Document Revised: 05/11/2012 Document Reviewed: 03/29/2012 Elsevier Interactive Patient Education [employer].

## 2015-03-22 LAB — CMP14+EGFR
A/G RATIO: 1.4 (ref 1.1–2.5)
ALBUMIN: 3.9 g/dL (ref 3.5–5.5)
ALT: 16 IU/L (ref 0–32)
AST: 16 IU/L (ref 0–40)
Alkaline Phosphatase: 60 IU/L (ref 39–117)
BUN / CREAT RATIO: 10 (ref 9–23)
BUN: 8 mg/dL (ref 6–24)
Bilirubin Total: 0.3 mg/dL (ref 0.0–1.2)
CALCIUM: 9.4 mg/dL (ref 8.7–10.2)
CO2: 24 mmol/L (ref 18–29)
CREATININE: 0.8 mg/dL (ref 0.57–1.00)
Chloride: 101 mmol/L (ref 97–108)
GFR, EST AFRICAN AMERICAN: 98 mL/min/{1.73_m2} (ref >59)
GFR, EST NON AFRICAN AMERICAN: 85 mL/min/{1.73_m2} (ref >59)
GLOBULIN, TOTAL: 2.8 g/dL (ref 1.5–4.5)
Glucose: 131 mg/dL — ABNORMAL HIGH (ref 65–99)
POTASSIUM: 5.1 mmol/L (ref 3.5–5.2)
SODIUM: 141 mmol/L (ref 134–144)
Total Protein: 6.7 g/dL (ref 6.0–8.5)

## 2015-03-30 ENCOUNTER — Other Ambulatory Visit: Payer: Self-pay | Admitting: Family

## 2015-04-05 ENCOUNTER — Telehealth: Payer: Self-pay | Admitting: Family

## 2015-04-05 MED ORDER — NITROFURANTOIN MONOHYD MACRO 100 MG PO CAPS: 100.0000 mg | ORAL_CAPSULE | Freq: Two times a day (BID) | ORAL | Status: DC

## 2015-04-05 NOTE — Telephone Encounter (Signed)
Macrobid Prescription sent to [facility] at [facility], [place]

## 2015-04-22 ENCOUNTER — Other Ambulatory Visit: Payer: Self-pay | Admitting: Pharmacist

## 2015-04-22 ENCOUNTER — Other Ambulatory Visit: Payer: Self-pay | Admitting: Family

## 2015-04-24 ENCOUNTER — Ambulatory Visit (INDEPENDENT_AMBULATORY_CARE_PROVIDER_SITE_OTHER): Payer: Self-pay | Admitting: Family Medicine

## 2015-04-24 ENCOUNTER — Encounter: Payer: Self-pay | Admitting: Pharmacist

## 2015-04-24 ENCOUNTER — Ambulatory Visit (INDEPENDENT_AMBULATORY_CARE_PROVIDER_SITE_OTHER): Payer: Self-pay

## 2015-04-24 ENCOUNTER — Ambulatory Visit: Payer: Self-pay

## 2015-04-24 ENCOUNTER — Encounter: Payer: Self-pay | Admitting: Family Medicine

## 2015-04-24 VITALS — BP 144/75 | HR 83 | Temp 97.5°F | Ht 70.0 in | Wt 232.0 lb

## 2015-04-24 DIAGNOSIS — C55 Malignant neoplasm of uterus, part unspecified: Secondary | ICD-10-CM

## 2015-04-24 DIAGNOSIS — I10 Essential (primary) hypertension: Secondary | ICD-10-CM

## 2015-04-24 DIAGNOSIS — R059 Cough, unspecified: Secondary | ICD-10-CM

## 2015-04-24 DIAGNOSIS — R05 Cough: Secondary | ICD-10-CM

## 2015-04-24 DIAGNOSIS — I739 Peripheral vascular disease, unspecified: Secondary | ICD-10-CM

## 2015-04-24 DIAGNOSIS — M7158 Other bursitis, not elsewhere classified, other site: Secondary | ICD-10-CM

## 2015-04-24 DIAGNOSIS — M542 Cervicalgia: Secondary | ICD-10-CM

## 2015-04-24 DIAGNOSIS — J449 Chronic obstructive pulmonary disease, unspecified: Secondary | ICD-10-CM

## 2015-04-24 DIAGNOSIS — R319 Hematuria, unspecified: Secondary | ICD-10-CM

## 2015-04-24 DIAGNOSIS — M25511 Pain in right shoulder: Secondary | ICD-10-CM

## 2015-04-24 DIAGNOSIS — M7550 Bursitis of unspecified shoulder: Secondary | ICD-10-CM

## 2015-04-24 LAB — POCT URINALYSIS DIPSTICK
Glucose, UA: NEGATIVE
Ketones, UA: NEGATIVE
Leukocytes, UA: NEGATIVE
NITRITE UA: NEGATIVE
SPEC GRAV UA: 1.015
UROBILINOGEN UA: NEGATIVE
pH, UA: 6

## 2015-04-24 LAB — POCT UA - MICROSCOPIC ONLY
Bacteria, U Microscopic: NEGATIVE
Casts, Ur, LPF, POC: NEGATIVE
Crystals, Ur, HPF, POC: NEGATIVE
MUCUS UA: NEGATIVE
WBC, UR, HPF, POC: NEGATIVE
YEAST UA: NEGATIVE

## 2015-04-24 LAB — POCT INR: INR: 2

## 2015-04-24 MED ORDER — DOXYCYCLINE HYCLATE 100 MG PO TABS: 100.0000 mg | ORAL_TABLET | Freq: Two times a day (BID) | ORAL | Status: DC

## 2015-04-24 NOTE — Progress Notes (Signed)
Subjective:    Patient ID: [name], female    DOB: 1961/03/11, 54 y.o.   MRN: [id number]  HPI Patient here today for several issues that include hematuria, cough and right shoulder pain. Patient has a cough, urinary frequency and discomfort with voiding, and arthralgias in the right neck and shoulder. The patient is unable to take prednisone or anti-inflammatory medicines. She has severe reaction to prednisone and she is on Coumadin for her circulation. She denies any chest pain or any more shortness of breath than usual other than what she has with her cough and congestion is smoking one half pack of cigarettes daily. She has not had any blood in the stool only blood in the urine when she voids. There is no pain with voiding.      Patient Active Problem List   Diagnosis Date Noted  . Hypothyroidism 01/29/2014  . Obesity (BMI 30-39.9) 08/16/2013  . Impetigo 07/28/2013  . COPD (chronic obstructive pulmonary disease) ([place])   . Uterine cancer ([place])   . Peripheral vascular occlusive disease ([place]) 05/17/2013  . GERD (gastroesophageal reflux disease) 10/18/2012  . Hernia, hiatal 10/18/2012  . Diabetes mellitus, type 2 ([place]) 04/18/2008  . Hyperlipidemia 04/18/2008  . SMOKER 04/18/2008  . Essential hypertension 04/18/2008   Outpatient Encounter Prescriptions as of 04/24/2015  Medication Sig  . albuterol (PROVENTIL HFA;VENTOLIN HFA) 108 (90 BASE) MCG/ACT inhaler Inhale 2 puffs into the lungs every 6 (six) hours as needed for wheezing or shortness of breath.  Marland Kitchen atorvastatin (LIPITOR) 40 MG tablet Take 1 tablet (40 mg total) by mouth daily. (Patient taking differently: Take 20 mg by mouth daily. )  . clopidogrel (PLAVIX) 75 MG tablet TAKE 1 TABLET DAILY  . escitalopram (LEXAPRO) 20 MG tablet TAKE 1 TABLET ONCE A DAY  . furosemide (LASIX) 20 MG tablet TAKE 1 TABLET DAILY  . Glucose Blood (BLOOD GLUCOSE TEST STRIPS) STRP One touch ultra 2 test strips.  Use to check BG up to twice  a day.  Dx:  250.02  . levothyroxine (SYNTHROID, LEVOTHROID) 100 MCG tablet Take 1 tablet (100 mcg total) by mouth daily before breakfast.  . losartan (COZAAR) 50 MG tablet TAKE 1 TABLET DAILY  . metFORMIN (GLUCOPHAGE-XR) 500 MG 24 hr tablet TAKE 2 TABLET ONCE DAILY WITH BREAKFAST (Patient taking differently: take 1 tablet qd)  . mupirocin ointment (BACTROBAN) 2 % APPLY TO THE NOSE AS DIRECTED 2 TIMES A DAY  . omeprazole (PRILOSEC) 20 MG capsule TAKE (1) CAPSULE DAILY  . Potassium Gluconate 595 MG CAPS Take 1 capsule by mouth daily.  Marland Kitchen Umeclidinium Bromide (INCRUSE ELLIPTA) 62.5 MCG/INH AEPB Inhale 1 puff into the lungs daily.  . Vitamin D, Ergocalciferol, (DRISDOL) 50000 UNITS CAPS capsule Take 50,000 Units by mouth every 7 (seven) days.  Marland Kitchen warfarin (COUMADIN) 5 MG tablet TAKE AS DIRECTED UP TO 1 1/2 TABLETS A DAY  . [DISCONTINUED] nitrofurantoin, macrocrystal-monohydrate, (MACROBID) 100 MG capsule Take 1 capsule (100 mg total) by mouth 2 (two) times daily.  . [DISCONTINUED] sulfamethoxazole-trimethoprim (BACTRIM DS,SEPTRA DS) 800-160 MG tablet Take 1 tablet by mouth 2 (two) times daily.   No facility-administered encounter medications on file as of 04/24/2015.      Review of Systems  Constitutional: Negative.   Eyes: Negative.   Respiratory: Positive for cough.   Cardiovascular: Negative.   Gastrointestinal: Negative.   Endocrine: Negative.   Genitourinary: Positive for dysuria, frequency and hematuria.  Musculoskeletal: Positive for arthralgias (neck and right shoudler pain).  Skin:  Negative.   Allergic/Immunologic: Negative.   Neurological: Negative.   Hematological: Negative.   Psychiatric/Behavioral: Negative.        Objective:   Physical Exam  Constitutional: She is oriented to person, place, and time. No distress.  HENT:  Head: Normocephalic and atraumatic.  Right Ear: External ear normal.  Left Ear: External ear normal.  Mouth/Throat: Oropharynx is clear and moist.    There is nasal congestion and turbinate swelling bilaterally left greater than right  Eyes: Conjunctivae and EOM are normal. Pupils are equal, round, and reactive to light. Right eye exhibits no discharge. Left eye exhibits no discharge. No scleral icterus.  Neck: Normal range of motion. Neck supple. No thyromegaly present.  No carotid bruits or anterior cervical adenopathy or thyromegaly  Cardiovascular: Normal rate, regular rhythm, normal heart sounds and intact distal pulses.  Exam reveals no gallop and no friction rub.   No murmur heard. At 76/m  Pulmonary/Chest: Effort normal. No respiratory distress. She has no wheezes. She has no rales. She exhibits no tenderness.  There are rhonchi bilaterally with coughing  Abdominal: Soft. Bowel sounds are normal. She exhibits no mass. There is no tenderness. There is no rebound and no guarding.  No tenderness  Musculoskeletal: Normal range of motion. She exhibits no edema.  The right suprascapular area is tender to palpation and this reproduces the pain she is hurting the most.  Lymphadenopathy:    She has no cervical adenopathy.  Neurological: She is alert and oriented to person, place, and time.  Skin: Skin is warm and dry. No rash noted.  Psychiatric: She has a normal mood and affect. Her behavior is normal. Judgment and thought content normal.  Nursing note and vitals reviewed.   BP 144/75 mmHg  Pulse 83  Temp(Src) 97.5 F (36.4 C) (Oral)  Ht 5\' 10"  (1.778 m)  Wt 232 lb (105.235 kg)  BMI 33.29 kg/m2  [facility] reading (PRIMARY) by  Dr. Brunilda Payor x-ray -   bronchitic changes and degenerative changes in spine                                    Assessment & Plan:  1. Peripheral vascular occlusive disease ([place]) -Continue trying to stop smoking - POCT INR  2. Cough -Take Mucinex maximum strength, blue and white in color, 1 twice daily with a large glass of water for cough and congestion and use nasal saline. - DG Chest 2 View;  Future  3. Neck pain -Use warm wet compresses 20 minutes 4 times daily and try applying green alcohol with dissolved aspirin as recommended 2 or 3 times daily. - DG Cervical Spine Complete; Future  4. Right shoulder pain -Moist heat and topical application of salon possible or green alcohol with ASA - DG Cervical Spine Complete; Future  5. Hematuria -Referred to urology - POCT urinalysis dipstick - POCT UA - Microscopic Only - Urine culture - Ambulatory referral to Urology  6. Chronic obstructive pulmonary disease, unspecified COPD type ([place]) -Stop smoking  7. Essential hypertension -The blood pressure is elevated today the patient should make all efforts to watch her sodium intake and continue to take her current medicines  8. Malignant neoplasm of uterus, unspecified site [facility]) -She has had a total abdominal hysterectomy.  9. Subscapular bursitis -Use warm wet compresses and try green alcohol or salonpas  Meds ordered this encounter  Medications  . doxycycline (VIBRA-TABS) 100 MG  tablet    Sig: Take 1 tablet (100 mg total) by mouth 2 (two) times daily.    Dispense:  20 tablet    Refill:  0   Patient Instructions   Anticoagulation Dose Instructions as of 04/24/2015      [name] Tue Wed Thu Fri Sat   New Dose 7.5 mg 7.5 mg 7.5 mg 7.5 mg 7.5 mg 7.5 mg 7.5 mg    Description        continue warfarin 5mg  take 1 and 1/2 tablets daily     The patient does have hematuria and she will be referred to the urologist for further evaluation of this. She will try green alcohol with dissolved aspirin and apply this to her area of the shoulder to see if this helps relieve any the pain and discomfort She cannot take prednisone or anti-inflammatory medicines She should take doxycycline 100 mg twice daily with food for 2 weeks because of her chronic bronchitis----she will have to adjust the Coumadin dose while she is taking this and the clinical pharmacist will tell her what to do. In  addition she should use warm wet compresses to the shoulder 20 minutes 4 times daily to the shoulder. She should continue to try to stop smoking completely She should take Mucinex for cough and use nasal saline for nasal congestion   Arrie Senate MD

## 2015-04-24 NOTE — Patient Instructions (Addendum)
Anticoagulation Dose Instructions as of 04/24/2015      [name] Tue Wed Thu Fri Sat   New Dose 7.5 mg 7.5 mg 7.5 mg 7.5 mg 7.5 mg 7.5 mg 7.5 mg    Description        continue warfarin 5mg  take 1 and 1/2 tablets daily     The patient does have hematuria and she will be referred to the urologist for further evaluation of this. She will try green alcohol with dissolved aspirin and apply this to her area of the shoulder to see if this helps relieve any the pain and discomfort She cannot take prednisone or anti-inflammatory medicines She should take doxycycline 100 mg twice daily with food for 2 weeks because of her chronic bronchitis----she will have to adjust the Coumadin dose while she is taking this and the clinical pharmacist will tell her what to do. In addition she should use warm wet compresses to the shoulder 20 minutes 4 times daily to the shoulder. She should continue to try to stop smoking completely She should take Mucinex for cough and use nasal saline for nasal congestion

## 2015-04-26 LAB — URINE CULTURE: Organism ID, Bacteria: NO GROWTH

## 2015-05-13 ENCOUNTER — Telehealth: Payer: Self-pay | Admitting: Family Medicine

## 2015-05-13 DIAGNOSIS — R319 Hematuria, unspecified: Secondary | ICD-10-CM

## 2015-05-14 NOTE — Telephone Encounter (Signed)
Patient has an appointment on 12/30 with urology and wants something else called in for her hematuria. Please advise

## 2015-05-14 NOTE — Telephone Encounter (Signed)
This. Hematuria if there is no infection. The patient should give [place] another urine specimen to make sure there is no infection. Please make sure that you get this appointment as soon as possible for this patient.

## 2015-05-14 NOTE — Telephone Encounter (Signed)
Patient aware and will come by and leave urine.

## 2015-05-17 ENCOUNTER — Other Ambulatory Visit: Payer: Self-pay

## 2015-05-17 MED ORDER — ESCITALOPRAM OXALATE 20 MG PO TABS: 20.0000 mg | ORAL_TABLET | Freq: Every day | ORAL | Status: DC

## 2015-05-17 MED ORDER — WARFARIN SODIUM 5 MG PO TABS: ORAL_TABLET | ORAL | Status: DC

## 2015-05-17 MED ORDER — CLOPIDOGREL BISULFATE 75 MG PO TABS: 75.0000 mg | ORAL_TABLET | Freq: Every day | ORAL | Status: DC

## 2015-05-17 MED ORDER — FUROSEMIDE 20 MG PO TABS: 20.0000 mg | ORAL_TABLET | Freq: Every day | ORAL | Status: DC

## 2015-06-04 ENCOUNTER — Other Ambulatory Visit: Payer: Self-pay | Admitting: *Deleted

## 2015-06-04 ENCOUNTER — Encounter: Payer: Self-pay | Admitting: Pharmacist

## 2015-06-04 MED ORDER — OMEPRAZOLE 20 MG PO CPDR: DELAYED_RELEASE_CAPSULE | ORAL | Status: DC

## 2015-06-06 ENCOUNTER — Other Ambulatory Visit: Payer: Self-pay

## 2015-06-06 MED ORDER — LEVOTHYROXINE SODIUM 100 MCG PO TABS: ORAL_TABLET | ORAL | Status: DC

## 2015-06-07 ENCOUNTER — Ambulatory Visit (INDEPENDENT_AMBULATORY_CARE_PROVIDER_SITE_OTHER): Payer: Self-pay | Admitting: Urology

## 2015-06-07 ENCOUNTER — Other Ambulatory Visit: Payer: Self-pay | Admitting: Urology

## 2015-06-07 DIAGNOSIS — R310 Gross hematuria: Secondary | ICD-10-CM

## 2015-06-07 DIAGNOSIS — N362 Urethral caruncle: Secondary | ICD-10-CM

## 2015-06-27 ENCOUNTER — Ambulatory Visit (HOSPITAL_COMMUNITY): Admission: RE | Admit: 2015-06-27 | Payer: Self-pay | Source: Ambulatory Visit

## 2015-07-03 ENCOUNTER — Ambulatory Visit (INDEPENDENT_AMBULATORY_CARE_PROVIDER_SITE_OTHER): Payer: Self-pay | Admitting: Pharmacist

## 2015-07-03 VITALS — Ht 70.0 in | Wt 236.0 lb

## 2015-07-03 DIAGNOSIS — I739 Peripheral vascular disease, unspecified: Secondary | ICD-10-CM

## 2015-07-03 DIAGNOSIS — Z86718 Personal history of other venous thrombosis and embolism: Secondary | ICD-10-CM

## 2015-07-03 NOTE — Progress Notes (Signed)
Subjective:     Indication: DVT HPI:  Patient had been taking warfarin. In September she experienced hematuria. She stopped both invokana and warfarin.  Saw urologist, but after workup was unable to determine etiology of hematuria. She did not however have [place] that urologist recommended due to cost.  Patient has not noticed hematuria and has not restarted Invokana or warfarin.  She states that she discussed stopping warfarin with her vascular specialist and he told her it will be OK since work up for genetic causes was negative.      Objective:  NO INR checked today   Assessment:  History of DVT and multiple incidences.  Hematuria - resolves.  Plan:    1. remain off warfarin per vascular specialist 2.  Continue clopidogrel daily

## 2015-07-16 ENCOUNTER — Telehealth: Payer: Self-pay

## 2015-07-16 NOTE — Telephone Encounter (Signed)
Dr Jeffie Pollock office called and [name] no showed for renal ultrasound   Wants to know if we want to schedule through our office or have them reschedule   Call [phone]

## 2015-07-18 NOTE — Telephone Encounter (Signed)
Call [facility] to have them schedule

## 2015-07-18 NOTE — Telephone Encounter (Signed)
Either is fine

## 2015-07-23 ENCOUNTER — Encounter (HOSPITAL_COMMUNITY): Payer: Self-pay | Admitting: Emergency Medicine

## 2015-07-23 ENCOUNTER — Emergency Department (HOSPITAL_COMMUNITY)
Admission: EM | Admit: 2015-07-23 | Discharge: 2015-07-23 | Disposition: A | Discharge status: Home / Self Care | Payer: Self-pay | Attending: Emergency Medicine | Admitting: Emergency Medicine

## 2015-07-23 DIAGNOSIS — Z79899 Other long term (current) drug therapy: Secondary | ICD-10-CM | POA: Insufficient documentation

## 2015-07-23 DIAGNOSIS — E669 Obesity, unspecified: Secondary | ICD-10-CM | POA: Insufficient documentation

## 2015-07-23 DIAGNOSIS — L509 Urticaria, unspecified: Secondary | ICD-10-CM | POA: Insufficient documentation

## 2015-07-23 DIAGNOSIS — Z8542 Personal history of malignant neoplasm of other parts of uterus: Secondary | ICD-10-CM | POA: Insufficient documentation

## 2015-07-23 DIAGNOSIS — I10 Essential (primary) hypertension: Secondary | ICD-10-CM | POA: Insufficient documentation

## 2015-07-23 DIAGNOSIS — Z7902 Long term (current) use of antithrombotics/antiplatelets: Secondary | ICD-10-CM | POA: Insufficient documentation

## 2015-07-23 DIAGNOSIS — Y9289 Other specified places as the place of occurrence of the external cause: Secondary | ICD-10-CM | POA: Insufficient documentation

## 2015-07-23 DIAGNOSIS — E039 Hypothyroidism, unspecified: Secondary | ICD-10-CM | POA: Insufficient documentation

## 2015-07-23 DIAGNOSIS — J449 Chronic obstructive pulmonary disease, unspecified: Secondary | ICD-10-CM | POA: Insufficient documentation

## 2015-07-23 DIAGNOSIS — K219 Gastro-esophageal reflux disease without esophagitis: Secondary | ICD-10-CM | POA: Insufficient documentation

## 2015-07-23 DIAGNOSIS — Z86718 Personal history of other venous thrombosis and embolism: Secondary | ICD-10-CM | POA: Insufficient documentation

## 2015-07-23 DIAGNOSIS — Z87440 Personal history of urinary (tract) infections: Secondary | ICD-10-CM | POA: Insufficient documentation

## 2015-07-23 DIAGNOSIS — Y998 Other external cause status: Secondary | ICD-10-CM | POA: Insufficient documentation

## 2015-07-23 DIAGNOSIS — F329 Major depressive disorder, single episode, unspecified: Secondary | ICD-10-CM | POA: Insufficient documentation

## 2015-07-23 DIAGNOSIS — E785 Hyperlipidemia, unspecified: Secondary | ICD-10-CM | POA: Insufficient documentation

## 2015-07-23 DIAGNOSIS — F17210 Nicotine dependence, cigarettes, uncomplicated: Secondary | ICD-10-CM | POA: Insufficient documentation

## 2015-07-23 DIAGNOSIS — Y9389 Activity, other specified: Secondary | ICD-10-CM | POA: Insufficient documentation

## 2015-07-23 DIAGNOSIS — Z7984 Long term (current) use of oral hypoglycemic drugs: Secondary | ICD-10-CM | POA: Insufficient documentation

## 2015-07-23 DIAGNOSIS — E119 Type 2 diabetes mellitus without complications: Secondary | ICD-10-CM | POA: Insufficient documentation

## 2015-07-23 DIAGNOSIS — T7840XA Allergy, unspecified, initial encounter: Secondary | ICD-10-CM | POA: Insufficient documentation

## 2015-07-23 MED ORDER — SODIUM CHLORIDE 0.9 % IV BOLUS (SEPSIS): 1000.0000 mL | Freq: Once | INTRAVENOUS | Status: DC

## 2015-07-23 MED ORDER — DIPHENHYDRAMINE HCL 50 MG/ML IJ SOLN
50.0000 mg | Freq: Once | INTRAMUSCULAR | Status: AC
Start: 2015-07-23 — End: 2015-07-23
  Administered 2015-07-23: 50 mg via INTRAMUSCULAR
  Filled 2015-07-23: qty 1

## 2015-07-23 MED ORDER — METHYLPREDNISOLONE SODIUM SUCC 125 MG IJ SOLR: 125.0000 mg | Freq: Once | INTRAMUSCULAR | Status: DC

## 2015-07-23 MED ORDER — DIPHENHYDRAMINE HCL 50 MG/ML IJ SOLN: 25.0000 mg | Freq: Once | INTRAMUSCULAR | Status: DC

## 2015-07-23 MED ORDER — FAMOTIDINE 20 MG PO TABS
40.0000 mg | ORAL_TABLET | Freq: Once | ORAL | Status: AC
  Administered 2015-07-23: 40 mg via ORAL
  Filled 2015-07-23: qty 2

## 2015-07-23 MED ORDER — FAMOTIDINE IN NACL 20-0.9 MG/50ML-% IV SOLN
20.0000 mg | Freq: Once | INTRAVENOUS | Status: DC
Start: 2015-07-23 — End: 2015-07-23

## 2015-07-23 NOTE — Discharge Instructions (Signed)
Recommend Benadryl and Pepcid for allergic reaction.

## 2015-07-23 NOTE — ED Provider Notes (Signed)
CSN: [id number]     Arrival date & time 07/23/15  1738 History   First MD Initiated Contact with Patient 07/23/15 1755     Chief Complaint  Patient presents with  . Urticaria     (Consider location/radiation/quality/duration/timing/severity/associated sxs/prior Treatment) HPI..... Itching, generalized erythema just prior to ED visit. No obvious allergens or inciting events. This has happened before but with unknown etiology. Patient has taken nothing at home. Severity of symptoms is moderate to severe. She is able to swallow. No respiratory distress.  Past Medical History  Diagnosis Date  . DM (diabetes mellitus) ([place])   . Hypertension   . Hypothyroid   . Hyperlipemia   . DVT (deep venous thrombosis) ([facility])     x5  . GERD (gastroesophageal reflux disease)   . UTI (urinary tract infection)   . Family history of colon cancer   . PVD (peripheral vascular disease) ([place])   . Depression   . Obesity   . COPD (chronic obstructive pulmonary disease) ([place])   . Uterine cancer [facility])    Past Surgical History  Procedure Laterality Date  . Abdominal hysterectomy    . Toe amputation      right  . Lumbar disc surgery      L4-L5  . Femoral bypass      x 5  . Cesarean section    . Tonsillectomy     Family History  Problem Relation Age of Onset  . Colon cancer Brother 61  . Hypertension Mother   . Hypothyroidism Mother   . Heart disease Father   . Diabetes Father   . Hyperlipidemia Father   . Clotting disorder Brother   . Diabetes Brother   . Heart disease Brother    Social History  Substance Use Topics  . Smoking status: Current Every Day Smoker -- 0.50 packs/day for 38 years    Types: Cigarettes  . Smokeless tobacco: Never Used  . Alcohol Use: No   OB History    No data available     Review of Systems  All other systems reviewed and are negative.     Allergies  Cortisone and Prednisone  Home Medications   Prior to Admission medications   Medication Sig Start  Date End Date Taking? Authorizing Provider  albuterol (PROVENTIL HFA;VENTOLIN HFA) 108 (90 BASE) MCG/ACT inhaler Inhale 2 puffs into the lungs every 6 (six) hours as needed for wheezing or shortness of breath. Patient not taking: Reported on 07/03/2015 06/19/13   Chipper Herb, MD  atorvastatin (LIPITOR) 40 MG tablet Take 1 tablet (40 mg total) by mouth daily. 12/18/14   Sharion Balloon, FNP  Cholecalciferol (VITAMIN D3) 5000 units TABS Take 1 tablet by mouth daily.    Historical Provider, MD  clopidogrel (PLAVIX) 75 MG tablet Take 1 tablet (75 mg total) by mouth daily. 05/17/15   Sharion Balloon, FNP  escitalopram (LEXAPRO) 20 MG tablet Take 1 tablet (20 mg total) by mouth daily. 05/17/15   Sharion Balloon, FNP  furosemide (LASIX) 20 MG tablet Take 1 tablet (20 mg total) by mouth daily. 05/17/15   Sharion Balloon, FNP  Glucose Blood (BLOOD GLUCOSE TEST STRIPS) STRP One touch ultra 2 test strips.  Use to check BG up to twice a day.  Dx:  250.02 09/04/13   Cherre Robins, PHARMD  levothyroxine (SYNTHROID, LEVOTHROID) 100 MCG tablet Take 1 tablet (100 mcg total) by mouth daily before breakfast. 06/06/15   Chipper Herb, MD  losartan (COZAAR)  50 MG tablet TAKE 1 TABLET DAILY 04/22/15   Sharion Balloon, FNP  metFORMIN (GLUCOPHAGE-XR) 500 MG 24 hr tablet TAKE 2 TABLET ONCE DAILY WITH BREAKFAST 05/23/14   Chipper Herb, MD  mupirocin ointment (BACTROBAN) 2 % APPLY TO THE NOSE AS DIRECTED 2 TIMES A DAY    Chipper Herb, MD  omeprazole (PRILOSEC) 20 MG capsule TAKE (1) CAPSULE DAILY 06/04/15   Sharion Balloon, FNP  Potassium Gluconate 595 MG CAPS Take 1 capsule by mouth daily.    Historical Provider, MD  Umeclidinium Bromide (INCRUSE ELLIPTA) 62.5 MCG/INH AEPB Inhale 1 puff into the lungs daily. 11/01/14   Tammy Eckard, PHARMD   BP 135/91 mmHg  Pulse 93  Temp(Src) 98.7 F (37.1 C) (Oral)  SpO2 98% Physical Exam  Constitutional: She is oriented to person, place, and time. She appears well-developed and  well-nourished.  No resp distress  HENT:  Head: Normocephalic and atraumatic.  Eyes: Conjunctivae and EOM are normal. Pupils are equal, round, and reactive to light.  Neck: Normal range of motion. Neck supple.  Cardiovascular: Normal rate and regular rhythm.   Pulmonary/Chest: Effort normal and breath sounds normal.  Abdominal: Soft. Bowel sounds are normal.  Musculoskeletal: Normal range of motion.  Neurological: She is alert and oriented to person, place, and time.  Skin:  Generalized erythema and urticaria  Psychiatric: She has a normal mood and affect. Her behavior is normal.  Nursing note and vitals reviewed.   ED Course  Procedures (including critical care time) Labs Review Labs Reviewed - No data to display  Imaging Review No results found. I have personally reviewed and evaluated these images and lab results as part of my medical decision-making.   EKG Interpretation None      MDM   Final diagnoses:  Allergic reaction, initial encounter    Patient is having an allergic reaction. She states an allergy to prednisone. Rx IM Benadryl and oral Pepcid. She feels much better after one hour of observation.    Nat Christen, MD 07/23/15 2030

## 2015-07-23 NOTE — ED Notes (Signed)
Patient arrives via EMS with c/o hives and itching that started shortly before arriving EMS. Patient denies any new environmental changes. EMS reports patient got in a hot shower and became dizzy and hypotensive just after getting out of shower. Patient arrives alert/oriented. EMS also reports patient just smoked marijuana just before symptoms occurred.

## 2015-08-02 ENCOUNTER — Telehealth: Payer: Self-pay | Admitting: Family Medicine

## 2015-08-02 NOTE — Telephone Encounter (Signed)
FYI

## 2015-08-20 ENCOUNTER — Ambulatory Visit: Payer: Self-pay | Admitting: Urology

## 2015-08-23 ENCOUNTER — Other Ambulatory Visit: Payer: Self-pay | Admitting: Family

## 2015-09-16 ENCOUNTER — Ambulatory Visit: Payer: Self-pay | Admitting: Family Medicine

## 2015-09-17 ENCOUNTER — Encounter: Payer: Self-pay | Admitting: Family

## 2015-09-17 ENCOUNTER — Ambulatory Visit: Payer: Self-pay | Admitting: Family Medicine

## 2015-09-25 ENCOUNTER — Ambulatory Visit (INDEPENDENT_AMBULATORY_CARE_PROVIDER_SITE_OTHER): Payer: Self-pay | Admitting: Family Medicine

## 2015-09-25 ENCOUNTER — Encounter: Payer: Self-pay | Admitting: Family Medicine

## 2015-09-25 VITALS — BP 109/67 | HR 75 | Temp 97.3°F | Ht 70.0 in | Wt 239.0 lb

## 2015-09-25 DIAGNOSIS — K219 Gastro-esophageal reflux disease without esophagitis: Secondary | ICD-10-CM

## 2015-09-25 DIAGNOSIS — E039 Hypothyroidism, unspecified: Secondary | ICD-10-CM

## 2015-09-25 DIAGNOSIS — I10 Essential (primary) hypertension: Secondary | ICD-10-CM

## 2015-09-25 DIAGNOSIS — Z86718 Personal history of other venous thrombosis and embolism: Secondary | ICD-10-CM

## 2015-09-25 DIAGNOSIS — E1152 Type 2 diabetes mellitus with diabetic peripheral angiopathy with gangrene: Secondary | ICD-10-CM

## 2015-09-25 DIAGNOSIS — Z1211 Encounter for screening for malignant neoplasm of colon: Secondary | ICD-10-CM

## 2015-09-25 DIAGNOSIS — E785 Hyperlipidemia, unspecified: Secondary | ICD-10-CM

## 2015-09-25 DIAGNOSIS — I739 Peripheral vascular disease, unspecified: Secondary | ICD-10-CM | POA: Insufficient documentation

## 2015-09-25 DIAGNOSIS — Z7901 Long term (current) use of anticoagulants: Secondary | ICD-10-CM

## 2015-09-25 DIAGNOSIS — C55 Malignant neoplasm of uterus, part unspecified: Secondary | ICD-10-CM

## 2015-09-25 DIAGNOSIS — J449 Chronic obstructive pulmonary disease, unspecified: Secondary | ICD-10-CM

## 2015-09-25 DIAGNOSIS — R319 Hematuria, unspecified: Secondary | ICD-10-CM

## 2015-09-25 LAB — MICROSCOPIC EXAMINATION
BACTERIA UA: NONE SEEN
RBC, UA: NONE SEEN /hpf (ref 0–?)

## 2015-09-25 LAB — URINALYSIS, COMPLETE
Bilirubin, UA: NEGATIVE
GLUCOSE, UA: NEGATIVE
Ketones, UA: NEGATIVE
Leukocytes, UA: NEGATIVE
Nitrite, UA: NEGATIVE
PH UA: 5.5 (ref 5.0–7.5)
PROTEIN UA: NEGATIVE
RBC, UA: NEGATIVE
Specific Gravity, UA: 1.01 (ref 1.005–1.030)
Urobilinogen, Ur: 0.2 mg/dL (ref 0.2–1.0)

## 2015-09-25 LAB — BAYER DCA HB A1C WAIVED: HB A1C: 7.4 % — AB (ref <7.0)

## 2015-09-25 LAB — COAGUCHEK XS/INR WAIVED
INR: 1.3 — AB (ref 0.9–1.1)
PROTHROMBIN TIME: 15.6 s

## 2015-09-25 NOTE — Addendum Note (Signed)
Addended by: Cherre Robins on: 09/25/2015 04:26 PM   Modules accepted: Orders

## 2015-09-25 NOTE — Progress Notes (Signed)
Subjective:    Patient ID: [name], female    DOB: 1960-10-10, 55 y.o.   MRN: [id number]  HPI Pt here for follow up and management of chronic medical problems which includes diabetes, hyperlipidemia, and hypertension. She is taking medications regularly. The patient is doing well overall. Her weight has gone up 3 pounds since last visit. The patient is under a lot of financial stress and would prefer as little bit of lab work as possible. She will get her pro time here today for basic lipid panel and an A1c. She is asking to get her mammogram and DEXA scan pelvic exam at the [facility]. She will return the FOBT and we will get a urine microalbumin. The patient denies chest pain. She has shortness of breath but no more than usual. She is not having any problems with swallowing heartburn indigestion nausea vomiting diarrhea or blood in the stool. She is passing her water without problems. She continues to smoke and says that she enjoys it and she will not stop. She does have her disability. She cannot get her Medicaid until a couple of years of being on disability. She is somehow able to get her medicines fairly cheap for one of the local drug stores because all of them are generic.    Patient Active Problem List   Diagnosis Date Noted  . Hypothyroidism 01/29/2014  . Obesity (BMI 30-39.9) 08/16/2013  . Impetigo 07/28/2013  . COPD (chronic obstructive pulmonary disease) ([place])   . Uterine cancer ([place])   . GERD (gastroesophageal reflux disease) 10/18/2012  . Hernia, hiatal 10/18/2012  . Diabetes mellitus, type 2 ([place]) 04/18/2008  . Hyperlipidemia 04/18/2008  . SMOKER 04/18/2008  . Essential hypertension 04/18/2008   Outpatient Encounter Prescriptions as of 09/25/2015  Medication Sig  . albuterol (PROVENTIL HFA;VENTOLIN HFA) 108 (90 BASE) MCG/ACT inhaler Inhale 2 puffs into the lungs every 6 (six) hours as needed for wheezing or shortness of breath.  Marland Kitchen atorvastatin  (LIPITOR) 40 MG tablet Take 1 tablet (40 mg total) by mouth daily.  . Cholecalciferol (VITAMIN D3) 5000 units TABS Take 1 tablet by mouth daily.  . clopidogrel (PLAVIX) 75 MG tablet TAKE 1 TABLET DAILY  . escitalopram (LEXAPRO) 20 MG tablet Take 1 tablet (20 mg total) by mouth daily.  . furosemide (LASIX) 20 MG tablet TAKE 1 TABLET DAILY  . Glucose Blood (BLOOD GLUCOSE TEST STRIPS) STRP One touch ultra 2 test strips.  Use to check BG up to twice a day.  Dx:  250.02  . levothyroxine (SYNTHROID, LEVOTHROID) 100 MCG tablet Take 1 tablet (100 mcg total) by mouth daily before breakfast.  . losartan (COZAAR) 50 MG tablet TAKE 1 TABLET DAILY  . metFORMIN (GLUCOPHAGE-XR) 500 MG 24 hr tablet TAKE 2 TABLET ONCE DAILY WITH BREAKFAST  . mupirocin ointment (BACTROBAN) 2 % APPLY TO THE NOSE AS DIRECTED 2 TIMES A DAY  . omeprazole (PRILOSEC) 20 MG capsule TAKE (1) CAPSULE DAILY  . Potassium Gluconate 595 MG CAPS Take 1 capsule by mouth daily.  Marland Kitchen Umeclidinium Bromide (INCRUSE ELLIPTA) 62.5 MCG/INH AEPB Inhale 1 puff into the lungs daily.  Marland Kitchen warfarin (COUMADIN) 5 MG tablet Take 5 mg by mouth daily. As directed   No facility-administered encounter medications on file as of 09/25/2015.      Review of Systems  Constitutional: Negative.   HENT: Negative.   Eyes: Negative.   Respiratory: Negative.   Cardiovascular: Negative.   Gastrointestinal: Negative.   Endocrine: Negative.  Genitourinary: Negative.   Musculoskeletal: Negative.   Skin: Negative.   Allergic/Immunologic: Negative.   Neurological: Negative.   Hematological: Negative.   Psychiatric/Behavioral: Negative.        Objective:   Physical Exam  Constitutional: She is oriented to person, place, and time. She appears well-developed and well-nourished. No distress.  HENT:  Head: Normocephalic and atraumatic.  Right Ear: External ear normal.  Left Ear: External ear normal.  Nose: Nose normal.  Mouth/Throat: Oropharynx is clear and moist.    Eyes: Conjunctivae and EOM are normal. Pupils are equal, round, and reactive to light. Right eye exhibits no discharge. Left eye exhibits no discharge. No scleral icterus.  Neck: Normal range of motion. Neck supple. No thyromegaly present.  Cardiovascular: Normal rate, regular rhythm and normal heart sounds.   No murmur heard. I was unable to palpate any pulses in either foot and of course she's had the fourth and fifth toes amputated on the right foot Heart is regular at 72/m  Pulmonary/Chest: Effort normal. No respiratory distress. She has no wheezes. She has no rales. She exhibits no tenderness.  With coughing there is slight wheezing and tightness but otherwise the breath sounds were good front to back.  Abdominal: Soft. Bowel sounds are normal. She exhibits no mass. There is no tenderness. There is no rebound and no guarding.  No liver or spleen enlargement and no epigastric tenderness  Musculoskeletal: Normal range of motion. She exhibits no edema.  Lymphadenopathy:    She has no cervical adenopathy.  Neurological: She is alert and oriented to person, place, and time. She has normal reflexes. No cranial nerve deficit.  Skin: Skin is warm and dry. No rash noted.  Psychiatric: She has a normal mood and affect. Her behavior is normal. Judgment and thought content normal.  Nursing note and vitals reviewed.  BP 109/67 mmHg  Pulse 75  Temp(Src) 97.3 F (36.3 C) (Oral)  Ht _0  (1.778 m)  Wt 239 lb (108.41 kg)  BMI 34.29 kg/m2 \       Assessment & Plan:  1. Type 2 diabetes mellitus with diabetic peripheral angiopathy and gangrene, without long-term current use of insulin ([facility]) -The patient should monitor blood sugars and possible - Bayer DCA Hb A1c Waived - BMP8+EGFR  2. Essential hypertension -The blood pressures were good today and she should continue with current treatment - Lipid panel - BMP8+EGFR  3. Chronic obstructive pulmonary disease, unspecified COPD type  ([place]) -Continue to make every effort at smoking cessation and continue with inhaler  4. Hyperlipidemia -Continue with atorvastatin pending results of lab work - Lipid panel  5. Gastroesophageal reflux disease, esophagitis presence not specified -Continue with omeprazole  6. Hypothyroidism, unspecified hypothyroidism type -Continue with current treatment pending results of future lab work  7. Malignant neoplasm of uterus, unspecified site [facility]) -The hysterectomy was several years ago she is having no problems with spotting or bleeding but plans to get a pelvic exam at the health department  8. Peripheral vascular insufficiency ([place]) -She continues to have peripheral vascular insufficiency with diminished pulses in both feet. She does see the vascular surgeon periodically.  9. Special screening for malignant neoplasms, colon - Fecal occult blood, imunochemical; Future  10. History of DVT (deep vein thrombosis) -Continue to get pro times checked on a regular basis - CoaguChek XS/INR Waived  11. Hematuria - Urinalysis, Complete  Patient Instructions  Continue current medications. Continue good therapeutic lifestyle changes which include good diet and exercise. Fall precautions  discussed with patient. If an FOBT was given today- please return it to our front desk. If you are over 76 years old - you may need Prevnar 36 or the adult Pneumonia vaccine.  **Flu shots are available--- please call and schedule a FLU-CLINIC appointment**  After your visit with [place] today you will receive a survey in the mail or online from [employer] regarding your care with [place]. Please take a moment to fill this out. Your feedback is very important to [place] as you can help [place] better understand your patient needs as well as improve your experience and satisfaction. WE CARE ABOUT YOU!!!   Try to stop smoking you may do not want to Drink plenty of fluids and walk as much as possible to help the  circulation Check blood sugars if possible Return to clinic for pro times as planned Get pelvic exam mammogram and DEXA scan at the health department as planned Get eye exam as planned and make sure that we get a hard copy of this report We will call with lab work results as soon as those results become available   Arrie Senate MD

## 2015-09-25 NOTE — Patient Instructions (Addendum)
Continue current medications. Continue good therapeutic lifestyle changes which include good diet and exercise. Fall precautions discussed with patient. If an FOBT was given today- please return it to our front desk. If you are over 53 years old - you may need Prevnar 41 or the adult Pneumonia vaccine.  **Flu shots are available--- please call and schedule a FLU-CLINIC appointment**  After your visit with [place] today you will receive a survey in the mail or online from [employer] regarding your care with [place]. Please take a moment to fill this out. Your feedback is very important to [place] as you can help [place] better understand your patient needs as well as improve your experience and satisfaction. WE CARE ABOUT YOU!!!   Try to stop smoking you may do not want to Drink plenty of fluids and walk as much as possible to help the circulation Check blood sugars if possible Return to clinic for pro times as planned Get pelvic exam mammogram and DEXA scan at the health department as planned Get eye exam as planned and make sure that we get a hard copy of this report We will call with lab work results as soon as those results become available   Anticoagulation Dose Instructions as of 09/25/2015      [name] Tue Wed Thu Fri Sat   New Dose 5 mg 5 mg 5 mg 5 mg 5 mg 5 mg 5 mg    Description        INR goal is actually 1.5 to 2.5  Continue warfarin 5mg  1 tablet daily     INR was 1.3 today

## 2015-09-26 LAB — BMP8+EGFR
BUN/Creatinine Ratio: 14 (ref 9–23)
BUN: 10 mg/dL (ref 6–24)
CALCIUM: 9.7 mg/dL (ref 8.7–10.2)
CHLORIDE: 98 mmol/L (ref 96–106)
CO2: 22 mmol/L (ref 18–29)
Creatinine, Ser: 0.69 mg/dL (ref 0.57–1.00)
GFR, EST AFRICAN AMERICAN: 115 mL/min/{1.73_m2} (ref >59)
GFR, EST NON AFRICAN AMERICAN: 100 mL/min/{1.73_m2} (ref >59)
Glucose: 124 mg/dL — ABNORMAL HIGH (ref 65–99)
Potassium: 4.3 mmol/L (ref 3.5–5.2)
Sodium: 139 mmol/L (ref 134–144)

## 2015-09-26 LAB — LIPID PANEL
CHOLESTEROL TOTAL: 178 mg/dL (ref 100–199)
Chol/HDL Ratio: 6.1 ratio units — ABNORMAL HIGH (ref 0.0–4.4)
HDL: 29 mg/dL — ABNORMAL LOW (ref >39)
LDL Calculated: 103 mg/dL — ABNORMAL HIGH (ref 0–99)
TRIGLYCERIDES: 230 mg/dL — AB (ref 0–149)
VLDL Cholesterol Cal: 46 mg/dL — ABNORMAL HIGH (ref 5–40)

## 2015-10-01 LAB — HM DIABETES EYE EXAM

## 2015-10-24 ENCOUNTER — Other Ambulatory Visit: Payer: Self-pay | Admitting: Family

## 2015-11-01 ENCOUNTER — Other Ambulatory Visit: Payer: Self-pay | Admitting: Urology

## 2015-11-01 DIAGNOSIS — R310 Gross hematuria: Secondary | ICD-10-CM

## 2015-11-07 ENCOUNTER — Other Ambulatory Visit: Payer: Self-pay

## 2015-11-11 ENCOUNTER — Encounter: Payer: Self-pay | Admitting: *Deleted

## 2015-11-11 ENCOUNTER — Other Ambulatory Visit: Payer: Self-pay | Admitting: Family

## 2015-11-25 ENCOUNTER — Other Ambulatory Visit: Payer: Self-pay | Admitting: Family

## 2015-11-25 ENCOUNTER — Other Ambulatory Visit: Payer: Self-pay | Admitting: Family Medicine

## 2015-12-02 ENCOUNTER — Ambulatory Visit (INDEPENDENT_AMBULATORY_CARE_PROVIDER_SITE_OTHER): Payer: Self-pay | Admitting: Pharmacist

## 2015-12-02 DIAGNOSIS — Z7901 Long term (current) use of anticoagulants: Secondary | ICD-10-CM

## 2015-12-02 DIAGNOSIS — Z86718 Personal history of other venous thrombosis and embolism: Secondary | ICD-10-CM

## 2015-12-02 DIAGNOSIS — I739 Peripheral vascular disease, unspecified: Secondary | ICD-10-CM

## 2015-12-02 LAB — COAGUCHEK XS/INR WAIVED
INR: 1.3 — ABNORMAL HIGH (ref 0.9–1.1)
PROTHROMBIN TIME: 15.8 s

## 2015-12-26 ENCOUNTER — Other Ambulatory Visit: Payer: Self-pay | Admitting: Family

## 2016-01-31 ENCOUNTER — Ambulatory Visit: Payer: Self-pay | Admitting: Family Medicine

## 2016-02-17 ENCOUNTER — Ambulatory Visit (INDEPENDENT_AMBULATORY_CARE_PROVIDER_SITE_OTHER): Payer: Self-pay | Admitting: Family Medicine

## 2016-02-17 ENCOUNTER — Encounter: Payer: Self-pay | Admitting: Family Medicine

## 2016-02-17 VITALS — BP 111/67 | HR 83 | Temp 97.1°F | Ht 70.0 in | Wt 242.0 lb

## 2016-02-17 DIAGNOSIS — C55 Malignant neoplasm of uterus, part unspecified: Secondary | ICD-10-CM

## 2016-02-17 DIAGNOSIS — E785 Hyperlipidemia, unspecified: Secondary | ICD-10-CM

## 2016-02-17 DIAGNOSIS — E1152 Type 2 diabetes mellitus with diabetic peripheral angiopathy with gangrene: Secondary | ICD-10-CM

## 2016-02-17 DIAGNOSIS — I10 Essential (primary) hypertension: Secondary | ICD-10-CM

## 2016-02-17 DIAGNOSIS — I739 Peripheral vascular disease, unspecified: Secondary | ICD-10-CM

## 2016-02-17 DIAGNOSIS — Z86718 Personal history of other venous thrombosis and embolism: Secondary | ICD-10-CM

## 2016-02-17 DIAGNOSIS — E039 Hypothyroidism, unspecified: Secondary | ICD-10-CM

## 2016-02-17 DIAGNOSIS — J449 Chronic obstructive pulmonary disease, unspecified: Secondary | ICD-10-CM

## 2016-02-17 DIAGNOSIS — K219 Gastro-esophageal reflux disease without esophagitis: Secondary | ICD-10-CM

## 2016-02-17 LAB — BAYER DCA HB A1C WAIVED: HB A1C: 7.7 % — AB (ref <7.0)

## 2016-02-17 NOTE — Progress Notes (Signed)
Subjective:    Patient ID: [name], female    DOB: 1961-03-10, 55 y.o.   MRN: [id number]  HPI Pt here for follow up and management of chronic medical problems which includes diabetes, hyperlipidemia and hypertension. She is taking medications regularly.This patient has a history of peripheral vascular insufficiency that is not amenable to surgery. She also has a history of COPD and uterine cancer along with diabetes hypertension and hyperlipidemia. The patient is having some issues with depression and stress and menopause. The patient is still smoking half a pack of cigarettes a day. She only uses her inhaler every other day. She sees the vascular surgeon yearly, Dr. early. She denies any chest pain or shortness of breath. She denies problems with her stomach other than she does have reflux and actually vomits her food at times and this usually associated with foods that are highly spiced. Otherwise her reflux is under control. She says her leg pain with walking is getting worse and she is able to walk on the shorter distances. She's been told by the back to surgeon that there is no surgery that can be done to improve her circulation.    Patient Active Problem List   Diagnosis Date Noted  . Peripheral vascular insufficiency ([place]) 09/25/2015  . Hypothyroidism 01/29/2014  . Obesity (BMI 30-39.9) 08/16/2013  . Impetigo 07/28/2013  . COPD (chronic obstructive pulmonary disease) ([place])   . Uterine cancer ([place])   . GERD (gastroesophageal reflux disease) 10/18/2012  . Hernia, hiatal 10/18/2012  . Diabetes mellitus, type 2 ([place]) 04/18/2008  . Hyperlipidemia 04/18/2008  . SMOKER 04/18/2008  . Essential hypertension 04/18/2008   Outpatient Encounter Prescriptions as of 02/17/2016  Medication Sig  . albuterol (PROVENTIL HFA;VENTOLIN HFA) 108 (90 BASE) MCG/ACT inhaler Inhale 2 puffs into the lungs every 6 (six) hours as needed for wheezing or shortness of breath.  Marland Kitchen aspirin 325 MG  tablet Take 325 mg by mouth daily.  Marland Kitchen atorvastatin (LIPITOR) 40 MG tablet Take 1 tablet (40 mg total) by mouth daily.  . Cholecalciferol (VITAMIN D3) 5000 units TABS Take 1 tablet by mouth daily.  . clopidogrel (PLAVIX) 75 MG tablet TAKE 1 TABLET DAILY  . escitalopram (LEXAPRO) 20 MG tablet TAKE 1 TABLET DAILY  . furosemide (LASIX) 20 MG tablet TAKE 1 TABLET DAILY  . Glucose Blood (BLOOD GLUCOSE TEST STRIPS) STRP One touch ultra 2 test strips.  Use to check BG up to twice a day.  Dx:  250.02  . levothyroxine (SYNTHROID, LEVOTHROID) 100 MCG tablet TAKE (1) TABLET DAILY BE- FORE BREAKFAST.  Marland Kitchen losartan (COZAAR) 50 MG tablet TAKE 1 TABLET DAILY  . metFORMIN (GLUCOPHAGE-XR) 500 MG 24 hr tablet TAKE 2 TABLET ONCE DAILY WITH BREAKFAST  . mupirocin ointment (BACTROBAN) 2 % APPLY TO THE NOSE AS DIRECTED 2 TIMES A DAY  . omeprazole (PRILOSEC) 20 MG capsule TAKE (1) CAPSULE DAILY  . Potassium Gluconate 595 MG CAPS Take 1 capsule by mouth daily.  Marland Kitchen Umeclidinium Bromide (INCRUSE ELLIPTA) 62.5 MCG/INH AEPB Inhale 1 puff into the lungs daily.  Marland Kitchen warfarin (COUMADIN) 5 MG tablet TAKE AS DIRECTED UP TO 1 1/2 TABLETS A DAY  . [DISCONTINUED] warfarin (COUMADIN) 5 MG tablet Take 5 mg by mouth daily. As directed   No facility-administered encounter medications on file as of 02/17/2016.       Review of Systems  Constitutional: Negative.   HENT: Negative.   Eyes: Negative.   Respiratory: Negative.   Cardiovascular: Negative.  Gastrointestinal: Negative.   Endocrine: Negative.   Genitourinary: Negative.   Musculoskeletal: Negative.   Skin: Negative.   Allergic/Immunologic: Negative.   Neurological: Negative.   Hematological: Negative.   Psychiatric/Behavioral: Negative.        Depression / stress        Objective:   Physical Exam  Constitutional: She is oriented to person, place, and time. She appears well-developed and well-nourished. No distress.  The patient is pleasant and alert and looks much  older than her stated age of 55 years.  HENT:  Head: Normocephalic and atraumatic.  Right Ear: External ear normal.  Left Ear: External ear normal.  Nose: Nose normal.  Mouth/Throat: Oropharynx is clear and moist.  Eyes: Conjunctivae and EOM are normal. Pupils are equal, round, and reactive to light. Right eye exhibits no discharge. Left eye exhibits no discharge. No scleral icterus.  Neck: Normal range of motion. Neck supple. No thyromegaly present.  Cardiovascular: Normal rate, regular rhythm and normal heart sounds.   No murmur heard. The heart is regular at 72/m Distal pulses were not palpable.  Pulmonary/Chest: Effort normal and breath sounds normal. No respiratory distress. She has no wheezes. She has no rales.  Breast sounds were good bilaterally but with coughing she has a tight cough.  Abdominal: Soft. Bowel sounds are normal. She exhibits no mass. There is no tenderness. There is no rebound and no guarding.  Abdominal obesity without masses or tenderness or organ enlargement  Musculoskeletal: Normal range of motion. She exhibits no edema.  Lymphadenopathy:    She has no cervical adenopathy.  Neurological: She is alert and oriented to person, place, and time. She has normal reflexes. No cranial nerve deficit.  Skin: Skin is warm and dry. Rash noted.  The patient has a healing rash that appears to be impetiginous in nature.  Psychiatric: She has a normal mood and affect. Her behavior is normal. Judgment and thought content normal.  She is fully alert and actually feeling better since she got her disability. She can still not afford lab work and we will at least get a hemoglobin A1c as we did a thyroid panel 6 months ago and it was normal.  Nursing note and vitals reviewed.  BP 111/67 (BP Location: Left Arm)   Pulse 83   Temp 97.1 F (36.2 C) (Oral)   Ht 5\' 10"  (1.778 m)   Wt 242 lb (109.8 kg)   BMI 34.72 kg/m        Assessment & Plan:  1. Type 2 diabetes mellitus with  diabetic peripheral angiopathy and gangrene, without long-term current use of insulin (HCC) -The patient does not check her blood sugars at home. She's been under less stress since she is not working anymore. She has a monitor but just does not check them. - Bayer DCA Hb A1c Waived  2. Essential hypertension -The blood pressure is good today and she will continue with current treatment  3. Chronic obstructive pulmonary disease, unspecified COPD type ([place]) -The patient says she likes to smoke and she is not going to stop smoking. She will continue with her inhalers on an as-needed basis.  4. Hyperlipidemia -Continue with aggressive therapeutic lifestyle changes as much as possible  5. Gastroesophageal reflux disease, esophagitis presence not specified Continue with omeprazole. If the frequency of vomiting after eating increases she will need to go see the gastroenterologist  6. Hypothyroidism, unspecified hypothyroidism type -The thyroid tests were normal 6 months ago we will continue with current treatment  7. History of DVT (deep vein thrombosis) -Continue with Coumadin and Plavix and aspirin and follow-up with vascular surgeon as planned  8. Peripheral vascular insufficiency (HCC) -Exercise as much as possible and follow-up with vascular surgeon as planned  9. Malignant neoplasm of uterus, unspecified site [facility]) -Follow-up with GYN  Patient Instructions  Continue current medications. Continue good therapeutic lifestyle changes which include good diet and exercise. Fall precautions discussed with patient. If an FOBT was given today- please return it to our front desk. If you are over 19 years old - you may need Prevnar 55 or the adult Pneumonia vaccine.  **Flu shots are available--- please call and schedule a FLU-CLINIC appointment**  After your visit with [place] today you will receive a survey in the mail or online from [employer] regarding your care with [place]. Please take a moment  to fill this out. Your feedback is very important to [place] as you can help [place] better understand your patient needs as well as improve your experience and satisfaction. WE CARE ABOUT YOU!!!    The patient was in [place] to try to stop smoking but says she does not want to do that. She was encouraged to check her blood sugars a little more often  Arrie Senate MD

## 2016-02-17 NOTE — Patient Instructions (Signed)
Continue current medications. Continue good therapeutic lifestyle changes which include good diet and exercise. Fall precautions discussed with patient. If an FOBT was given today- please return it to our front desk. If you are over 53 years old - you may need Prevnar 13 or the adult Pneumonia vaccine.  **Flu shots are available--- please call and schedule a FLU-CLINIC appointment**  After your visit with us today you will receive a survey in the mail or online from [employer] regarding your care with us. Please take a moment to fill this out. Your feedback is very important to us as you can help us better understand your patient needs as well as improve your experience and satisfaction. WE CARE ABOUT YOU!!!    

## 2016-02-18 ENCOUNTER — Telehealth: Payer: Self-pay | Admitting: Family

## 2016-02-18 ENCOUNTER — Other Ambulatory Visit: Payer: Self-pay | Admitting: *Deleted

## 2016-02-18 DIAGNOSIS — I10 Essential (primary) hypertension: Secondary | ICD-10-CM

## 2016-02-18 DIAGNOSIS — E119 Type 2 diabetes mellitus without complications: Secondary | ICD-10-CM

## 2016-02-18 NOTE — Telephone Encounter (Signed)
Patient aware of results and to come in and have more labs drawn

## 2016-02-20 ENCOUNTER — Other Ambulatory Visit: Payer: Self-pay | Admitting: Family

## 2016-03-05 ENCOUNTER — Other Ambulatory Visit: Payer: Self-pay | Admitting: Family Medicine

## 2016-03-12 ENCOUNTER — Other Ambulatory Visit: Payer: Self-pay | Admitting: Nurse Practitioner

## 2016-04-16 ENCOUNTER — Other Ambulatory Visit: Payer: Self-pay | Admitting: Family

## 2016-04-21 ENCOUNTER — Other Ambulatory Visit: Payer: Self-pay | Admitting: Family

## 2016-05-20 ENCOUNTER — Telehealth: Payer: Self-pay | Admitting: Pharmacist

## 2016-05-20 NOTE — Telephone Encounter (Signed)
Left message for patient that INR is past due.  Needs to call office to set up appt.

## 2016-05-26 ENCOUNTER — Other Ambulatory Visit: Payer: Self-pay | Admitting: Family

## 2016-05-26 ENCOUNTER — Other Ambulatory Visit: Payer: Self-pay | Admitting: Family Medicine

## 2016-05-27 ENCOUNTER — Telehealth: Payer: Self-pay

## 2016-05-27 ENCOUNTER — Other Ambulatory Visit: Payer: Self-pay | Admitting: Family

## 2016-05-27 NOTE — Telephone Encounter (Signed)
Message sent to pools that patient needed to be seen and have lab work. Patient has appointment with Laurance Flatten in March.

## 2016-05-27 NOTE — Progress Notes (Unsigned)
Patient NTBS for follow up and lab work  

## 2016-06-02 ENCOUNTER — Other Ambulatory Visit: Payer: Self-pay | Admitting: Family Medicine

## 2016-06-02 ENCOUNTER — Other Ambulatory Visit: Payer: Self-pay | Admitting: Family

## 2016-06-27 ENCOUNTER — Other Ambulatory Visit: Payer: Self-pay | Admitting: Pediatrics

## 2016-07-10 ENCOUNTER — Telehealth: Payer: Self-pay | Admitting: Pharmacist Clinician (PhC)/ Clinical Pharmacy Specialist

## 2016-07-10 NOTE — Telephone Encounter (Signed)
Patient states that she is not taking warfarin for any specific diagnosis except that she has leg pain from vascular problems and low dose warfarin relieves the pain.  She has had INRs around 1.3 and was told by Dr. Laurance Flatten that she does not require close monitoring for this reason.  Dr. Laurance Flatten will check her INR on her visits.  I explained to patient that antibiotics and other medications could cause the INR to increase and cause bleeding.  IF she has any medication changes she should have her INR checked, or if she has any signs or symptoms of bleeding.  Patient agrees.

## 2016-07-16 ENCOUNTER — Other Ambulatory Visit: Payer: Self-pay | Admitting: Family Medicine

## 2016-08-05 ENCOUNTER — Telehealth: Payer: Self-pay | Admitting: Family

## 2016-08-05 NOTE — Telephone Encounter (Signed)
What symptoms do you have? Pt scraped arm with nail broke skin a little bit  How long have you been sick? No wants to know when last tetanus shot was  Have you been seen for this problem? No has appt march 12 wants to know if ok to wait until then  If your provider decides to give you a prescription, which pharmacy would you like for it to be sent to?    Patient informed that this information will be sent to the clinical staff for review and that they should receive a follow up call.

## 2016-08-05 NOTE — Telephone Encounter (Signed)
Aware.  She can come by for a Tdap or she can wait until her appointment in March if the wound does not get infected or no other issues.

## 2016-08-17 ENCOUNTER — Ambulatory Visit: Payer: Self-pay | Admitting: Family Medicine

## 2016-08-27 ENCOUNTER — Encounter: Payer: Self-pay | Admitting: Family Medicine

## 2016-08-27 ENCOUNTER — Ambulatory Visit (INDEPENDENT_AMBULATORY_CARE_PROVIDER_SITE_OTHER): Payer: Medicare Other

## 2016-08-27 ENCOUNTER — Ambulatory Visit (INDEPENDENT_AMBULATORY_CARE_PROVIDER_SITE_OTHER): Payer: Medicare Other | Admitting: Family Medicine

## 2016-08-27 VITALS — BP 132/79 | HR 79 | Temp 97.2°F | Ht 70.0 in | Wt 236.0 lb

## 2016-08-27 DIAGNOSIS — R0602 Shortness of breath: Secondary | ICD-10-CM

## 2016-08-27 DIAGNOSIS — E1152 Type 2 diabetes mellitus with diabetic peripheral angiopathy with gangrene: Secondary | ICD-10-CM

## 2016-08-27 DIAGNOSIS — E7800 Pure hypercholesterolemia, unspecified: Secondary | ICD-10-CM

## 2016-08-27 DIAGNOSIS — I82403 Acute embolism and thrombosis of unspecified deep veins of lower extremity, bilateral: Secondary | ICD-10-CM

## 2016-08-27 DIAGNOSIS — F17200 Nicotine dependence, unspecified, uncomplicated: Secondary | ICD-10-CM | POA: Diagnosis not present

## 2016-08-27 DIAGNOSIS — E559 Vitamin D deficiency, unspecified: Secondary | ICD-10-CM | POA: Diagnosis not present

## 2016-08-27 DIAGNOSIS — K219 Gastro-esophageal reflux disease without esophagitis: Secondary | ICD-10-CM | POA: Diagnosis not present

## 2016-08-27 DIAGNOSIS — I739 Peripheral vascular disease, unspecified: Secondary | ICD-10-CM | POA: Diagnosis not present

## 2016-08-27 DIAGNOSIS — I10 Essential (primary) hypertension: Secondary | ICD-10-CM | POA: Diagnosis not present

## 2016-08-27 DIAGNOSIS — J449 Chronic obstructive pulmonary disease, unspecified: Secondary | ICD-10-CM | POA: Diagnosis not present

## 2016-08-27 LAB — BAYER DCA HB A1C WAIVED: HB A1C (BAYER DCA - WAIVED): 8.1 % — ABNORMAL HIGH (ref <7.0)

## 2016-08-27 MED ORDER — TRETINOIN 0.025 % EX CREA: TOPICAL_CREAM | Freq: Every day | CUTANEOUS | 3 refills | Status: DC

## 2016-08-27 MED ORDER — UMECLIDINIUM BROMIDE 62.5 MCG/INH IN AEPB: 1.0000 | INHALATION_SPRAY | Freq: Every day | RESPIRATORY_TRACT | 3 refills | Status: DC

## 2016-08-27 NOTE — Patient Instructions (Addendum)
Continue current medications. Continue good therapeutic lifestyle changes which include good diet and exercise. Fall precautions discussed with patient. If an FOBT was given today- please return it to our front desk. If you are over 54 years old - you may need Prevnar 10 or the adult Pneumonia vaccine.  **Flu shots are available--- please call and schedule a FLU-CLINIC appointment**  After your visit with [place] today you will receive a survey in the mail or online from [employer] regarding your care with [place]. Please take a moment to fill this out. Your feedback is very important to [place] as you can help [place] better understand your patient needs as well as improve your experience and satisfaction. WE CARE ABOUT YOU!!!  The patient should continue with her current medications She should drink plenty of fluids and stay well hydrated She should check her blood sugars regularly and bring readings from home with her to the office visit We will arrange for you to have an appointment with the cardiologist that comes next-door for cardiac evaluation in light of your multiple risk factors

## 2016-08-27 NOTE — Addendum Note (Signed)
Addended by: Zannie Cove on: 08/27/2016 04:26 PM   Modules accepted: Orders

## 2016-08-27 NOTE — Progress Notes (Signed)
Subjective:    Patient ID: [name], female    DOB: Jan 05, 1961, 56 y.o.   MRN: [id number]  HPI Pt here for follow up and management of chronic medical problems which includes diabetes, hypertension, and hyperlipidemia. She is taking medication regularly.The patient today complains of a knot on her head. She is due to get a mammogram and a urine microalbumin to creatinine ratio. She is also due for a pelvic and Pap smear. She will get lab work today. Her weight is down 6 pounds from her previous visit. Her BMI is 34.8. The patient is improving and something fell and hit her head and has been extremely sore. It was to the back of the head. She was not knocked out and she has not had a stiff neck or anything like that. The patient denies any chest pain. She has had more shortness of breath but has run out of one of her inhalers and we will see about refilling that. She continues to smoke and she hasn't had a chest x-ray since 2016 so we will get a chest x-ray today. She thinks her shortness of breath has been worse and she's been out of the inhaler. She occasionally about once a week does have some vomiting which she attributes to her reflux. She is taking a proton pump inhibitor like Prilosec regularly. Otherwise she denies any nausea diarrhea blood in the stool or black tarry bowel movements. Her last colonoscopy was in January 2014. She is passing her water without problems. She continues to take her Coumadin for her DVT. The patient says her blood sugars at home and been running in the 150 range.     Patient Active Problem List   Diagnosis Date Noted  . Peripheral vascular insufficiency ([place]) 09/25/2015  . Hypothyroidism 01/29/2014  . Obesity (BMI 30-39.9) 08/16/2013  . Impetigo 07/28/2013  . COPD (chronic obstructive pulmonary disease) ([place])   . Uterine cancer ([place])   . GERD (gastroesophageal reflux disease) 10/18/2012  . Hernia, hiatal 10/18/2012  . Diabetes mellitus, type 2  ([place]) 04/18/2008  . Hyperlipidemia 04/18/2008  . SMOKER 04/18/2008  . Essential hypertension 04/18/2008   Outpatient Encounter Prescriptions as of 08/27/2016  Medication Sig  . albuterol (PROVENTIL HFA;VENTOLIN HFA) 108 (90 BASE) MCG/ACT inhaler Inhale 2 puffs into the lungs every 6 (six) hours as needed for wheezing or shortness of breath.  Marland Kitchen aspirin 325 MG tablet Take 325 mg by mouth daily.  Marland Kitchen atorvastatin (LIPITOR) 40 MG tablet TAKE 1 TABLET DAILY  . Cholecalciferol (VITAMIN D3) 5000 units TABS Take 1 tablet by mouth daily.  . clopidogrel (PLAVIX) 75 MG tablet TAKE 1 TABLET DAILY  . escitalopram (LEXAPRO) 20 MG tablet TAKE 1 TABLET DAILY  . furosemide (LASIX) 20 MG tablet TAKE 1 TABLET DAILY  . levothyroxine (SYNTHROID, LEVOTHROID) 100 MCG tablet TAKE (1) TABLET DAILY BE- FORE BREAKFAST.  Marland Kitchen losartan (COZAAR) 50 MG tablet TAKE 1 TABLET DAILY  . metFORMIN (GLUCOPHAGE-XR) 500 MG 24 hr tablet TAKE 2 TABLET ONCE DAILY WITH BREAKFAST  . mupirocin ointment (BACTROBAN) 2 % APPLY TO THE NOSE AS DIRECTED 2 TIMES A DAY  . ONE TOUCH ULTRA TEST test strip TEST BLOOD SUGARS TWICE A DAY  . Potassium Gluconate 595 MG CAPS Take 1 capsule by mouth daily.  Marland Kitchen Umeclidinium Bromide (INCRUSE ELLIPTA) 62.5 MCG/INH AEPB Inhale 1 puff into the lungs daily.  Marland Kitchen warfarin (COUMADIN) 5 MG tablet TAKE AS DIRECTED UP TO 1 1/2 TABLETS A DAY   No  facility-administered encounter medications on file as of 08/27/2016.       Review of Systems  Constitutional: Negative.   HENT: Negative.   Eyes: Negative.   Respiratory: Negative.   Cardiovascular: Negative.   Gastrointestinal: Negative.   Endocrine: Negative.   Genitourinary: Negative.   Musculoskeletal: Negative.   Skin: Negative.        Hit head on a metal sign - knot  Allergic/Immunologic: Negative.   Neurological: Negative.   Hematological: Negative.   Psychiatric/Behavioral: Negative.        Objective:   Physical Exam  Constitutional: She is oriented  to person, place, and time. She appears well-developed and well-nourished. No distress.  The patient is very alert and looks older than her stated age. She is still a smoker.  HENT:  Head: Normocephalic and atraumatic.  Right Ear: External ear normal.  Left Ear: External ear normal.  Mouth/Throat: Oropharynx is clear and moist.  Nasal congestion and turbinate swelling bilaterally  Eyes: Conjunctivae and EOM are normal. Pupils are equal, round, and reactive to light. Right eye exhibits no discharge. Left eye exhibits no discharge. No scleral icterus.  Neck: Normal range of motion. Neck supple. No thyromegaly present.  No bruits thyromegaly or anterior cervical adenopathy  Cardiovascular: Normal rate, regular rhythm and normal heart sounds.   No murmur heard. Distal pulses were The heart has a regular rate and rhythm at 72/m  Pulmonary/Chest: Effort normal and breath sounds normal. No respiratory distress. She has no wheezes. She has no rales.  Clear anteriorly and posteriorly  Abdominal: Soft. Bowel sounds are normal. She exhibits no mass. There is tenderness. There is no rebound and no guarding.  Epigastric tenderness without liver or spleen enlargement and no abdominal masses or bruits.  Musculoskeletal: Normal range of motion. She exhibits no edema.  Lymphadenopathy:    She has no cervical adenopathy.  Neurological: She is alert and oriented to person, place, and time. She has normal reflexes. No cranial nerve deficit.  Skin: Skin is warm and dry. No rash noted.  Psychiatric: She has a normal mood and affect. Her behavior is normal. Judgment and thought content normal.  Nursing note and vitals reviewed.  BP 132/79 (BP Location: Left Arm)   Pulse 79   Temp 97.2 F (36.2 C) (Oral)   Ht 5' 10"  (1.778 m)   Wt 236 lb (107 kg)   BMI 33.86 kg/m        Assessment & Plan:  1. Type 2 diabetes mellitus with diabetic peripheral angiopathy and gangrene, without long-term current use of  insulin (HCC) -Continue aggressive therapeutic lifestyle changes including diet and exercise and current treatment pending results of lab work - CBC with Differential/Platelet - Bayer [place] Hb A1c Waived - Microalbumin / creatinine urine ratio - Ambulatory referral to Cardiology  2. Essential hypertension -The blood pressure is good today and she will continue with current treatment - CBC with Differential/Platelet - Hepatic function panel - BMP8+EGFR - Ambulatory referral to Cardiology  3. Pure hypercholesterolemia -Continue with aggressive therapeutic lifestyle changes and atorvastatin pending results of lab work - CBC with Differential/Platelet - Lipid panel - Ambulatory referral to Cardiology  4. Gastroesophageal reflux disease, esophagitis presence not specified -Add ranitidine 150 mg at night take this regularly for at least a month and if vomiting issues continue early in the morning we will reschedule her to see the gastroenterologist for further evaluation - CBC with Differential/Platelet  5. Vitamin D deficiency -Any current treatment pending results of  lab work - VITAMIN D 25 Hydroxy (Vit-D Deficiency, Fractures)  6. Peripheral vascular insufficiency (HCC) -The patient continues to have problems with her feet and legs with diminished pulses  7. Chronic obstructive pulmonary disease, unspecified COPD type ([place]) -Try to make all efforts at smoking cessation -Refill incruse  8. Deep vein thrombosis (DVT) of both lower extremities, unspecified chronicity, unspecified vein (HCC) -Continue with Coumadin indefinitely and get pro times checked regularly  9. Smoker -Make all efforts at smoking cessation - DG Chest 2 View; Future - Ambulatory referral to Cardiology  10. SOB (shortness of breath) -Stop smoking - DG Chest 2 View; Future - Ambulatory referral to Cardiology  Meds ordered this encounter  Medications  . tretinoin (RETIN-A) 0.025 % cream    Sig: Apply  topically at bedtime.    Dispense:  45 g    Refill:  3  . umeclidinium bromide (INCRUSE ELLIPTA) 62.5 MCG/INH AEPB    Sig: Inhale 1 puff into the lungs daily.    Dispense:  30 each    Refill:  3   Patient Instructions  Continue current medications. Continue good therapeutic lifestyle changes which include good diet and exercise. Fall precautions discussed with patient. If an FOBT was given today- please return it to our front desk. If you are over 80 years old - you may need Prevnar 59 or the adult Pneumonia vaccine.  **Flu shots are available--- please call and schedule a FLU-CLINIC appointment**  After your visit with [place] today you will receive a survey in the mail or online from [employer] regarding your care with [place]. Please take a moment to fill this out. Your feedback is very important to [place] as you can help [place] better understand your patient needs as well as improve your experience and satisfaction. WE CARE ABOUT YOU!!!  The patient should continue with her current medications She should drink plenty of fluids and stay well hydrated She should check her blood sugars regularly and bring readings from home with her to the office visit   Arrie Senate MD

## 2016-08-28 LAB — BMP8+EGFR
BUN / CREAT RATIO: 14 (ref 9–23)
BUN: 11 mg/dL (ref 6–24)
CO2: 25 mmol/L (ref 18–29)
Calcium: 9.2 mg/dL (ref 8.7–10.2)
Chloride: 101 mmol/L (ref 96–106)
Creatinine, Ser: 0.81 mg/dL (ref 0.57–1.00)
GFR calc Af Amer: 95 mL/min/{1.73_m2} (ref >59)
GFR, EST NON AFRICAN AMERICAN: 83 mL/min/{1.73_m2} (ref >59)
Glucose: 150 mg/dL — ABNORMAL HIGH (ref 65–99)
POTASSIUM: 4.3 mmol/L (ref 3.5–5.2)
SODIUM: 141 mmol/L (ref 134–144)

## 2016-08-28 LAB — CBC WITH DIFFERENTIAL/PLATELET
BASOS: 0 %
Basophils Absolute: 0 10*3/uL (ref 0.0–0.2)
EOS (ABSOLUTE): 0.3 10*3/uL (ref 0.0–0.4)
Eos: 3 %
Hematocrit: 42.9 % (ref 34.0–46.6)
Hemoglobin: 13.9 g/dL (ref 11.1–15.9)
IMMATURE GRANULOCYTES: 0 %
Immature Grans (Abs): 0 10*3/uL (ref 0.0–0.1)
Lymphocytes Absolute: 2.6 10*3/uL (ref 0.7–3.1)
Lymphs: 27 %
MCH: 28.3 pg (ref 26.6–33.0)
MCHC: 32.4 g/dL (ref 31.5–35.7)
MCV: 87 fL (ref 79–97)
MONOS ABS: 0.7 10*3/uL (ref 0.1–0.9)
Monocytes: 7 %
Neutrophils Absolute: 6.2 10*3/uL (ref 1.4–7.0)
Neutrophils: 63 %
PLATELETS: 214 10*3/uL (ref 150–379)
RBC: 4.92 x10E6/uL (ref 3.77–5.28)
RDW: 15 % (ref 12.3–15.4)
WBC: 9.8 10*3/uL (ref 3.4–10.8)

## 2016-08-28 LAB — HEPATIC FUNCTION PANEL
ALT: 14 IU/L (ref 0–32)
AST: 14 IU/L (ref 0–40)
Albumin: 3.7 g/dL (ref 3.5–5.5)
Alkaline Phosphatase: 52 IU/L (ref 39–117)
Bilirubin Total: 0.2 mg/dL (ref 0.0–1.2)
Bilirubin, Direct: 0.09 mg/dL (ref 0.00–0.40)
Total Protein: 6.6 g/dL (ref 6.0–8.5)

## 2016-08-28 LAB — LIPID PANEL
Chol/HDL Ratio: 5.9 ratio units — ABNORMAL HIGH (ref 0.0–4.4)
Cholesterol, Total: 158 mg/dL (ref 100–199)
HDL: 27 mg/dL — AB (ref >39)
LDL Calculated: 95 mg/dL (ref 0–99)
TRIGLYCERIDES: 180 mg/dL — AB (ref 0–149)
VLDL Cholesterol Cal: 36 mg/dL (ref 5–40)

## 2016-08-28 LAB — VITAMIN D 25 HYDROXY (VIT D DEFICIENCY, FRACTURES): VIT D 25 HYDROXY: 54.5 ng/mL (ref 30.0–100.0)

## 2016-09-02 ENCOUNTER — Encounter: Payer: Medicare Other | Admitting: *Deleted

## 2016-09-07 ENCOUNTER — Other Ambulatory Visit: Payer: Self-pay | Admitting: Family

## 2016-09-24 ENCOUNTER — Ambulatory Visit: Payer: Medicare Other | Admitting: Physician Assistant

## 2016-10-06 ENCOUNTER — Other Ambulatory Visit: Payer: Self-pay | Admitting: Family Medicine

## 2016-10-14 ENCOUNTER — Other Ambulatory Visit: Payer: Self-pay | Admitting: Family

## 2016-10-15 NOTE — Telephone Encounter (Signed)
Last TSH 12/2014.

## 2016-10-27 ENCOUNTER — Other Ambulatory Visit: Payer: Self-pay | Admitting: Family

## 2016-10-28 ENCOUNTER — Other Ambulatory Visit: Payer: Self-pay | Admitting: Family

## 2016-10-28 ENCOUNTER — Other Ambulatory Visit: Payer: Self-pay | Admitting: Family Medicine

## 2016-11-05 ENCOUNTER — Ambulatory Visit (INDEPENDENT_AMBULATORY_CARE_PROVIDER_SITE_OTHER): Payer: Medicare Other | Admitting: Nurse Practitioner

## 2016-11-05 ENCOUNTER — Encounter: Payer: Self-pay | Admitting: Nurse Practitioner

## 2016-11-05 VITALS — BP 103/69 | HR 67 | Temp 97.7°F | Ht 70.0 in | Wt 240.0 lb

## 2016-11-05 DIAGNOSIS — K047 Periapical abscess without sinus: Secondary | ICD-10-CM | POA: Diagnosis not present

## 2016-11-05 MED ORDER — CLINDAMYCIN HCL 300 MG PO CAPS: 300.0000 mg | ORAL_CAPSULE | Freq: Three times a day (TID) | ORAL | 0 refills | Status: DC

## 2016-11-05 MED ORDER — HYDROCODONE-ACETAMINOPHEN 5-325 MG PO TABS
1.0000 | ORAL_TABLET | Freq: Four times a day (QID) | ORAL | 0 refills | Status: DC | PRN
Start: 2016-11-05 — End: 2017-01-05

## 2016-11-05 NOTE — Progress Notes (Signed)
   Subjective:    Patient ID: [name], female    DOB: 08/22/1960, 56 y.o.   MRN: [id number]  HPI Patient comes in today c/o toothache- has called dentist and they are closed on Friday and the earliest they could see her would be Monday of next week- just need antibiotic to get her through until can have tooth pulled.    Review of Systems  Constitutional: Negative for chills and fever.  HENT: Positive for dental problem.   Respiratory: Negative.   Cardiovascular: Negative.   Neurological: Negative.   Psychiatric/Behavioral: Negative.   All other systems reviewed and are negative.      Objective:   Physical Exam  Constitutional: She appears well-developed and well-nourished. She appears distressed (mild).  HENT:  Left lower 3rd molar abscess with erythematous mucosa and left jaw swelling  Cardiovascular: Normal rate.   Pulmonary/Chest: Effort normal and breath sounds normal.  Neurological: She is alert.  Skin: Skin is warm.  Psychiatric: She has a normal mood and affect. Her behavior is normal. Judgment and thought content normal.   BP 103/69   Pulse 67   Temp 97.7 F (36.5 C) (Oral)   Ht 5\' 10"  (1.778 m)   Wt 240 lb (108.9 kg)   BMI 34.44 kg/m       Assessment & Plan:   1. Abscessed tooth    Meds ordered this encounter  Medications  . HYDROcodone-acetaminophen (LORTAB) 5-325 MG tablet    Sig: Take 1 tablet by mouth every 6 (six) hours as needed for moderate pain.    Dispense:  40 tablet    Refill:  0    Order Specific Question:   Supervising Provider    Answer:   VINCENT, CAROL L [[id number]]  . clindamycin (CLEOCIN) 300 MG capsule    Sig: Take 1 capsule (300 mg total) by mouth 3 (three) times daily.    Dispense:  30 capsule    Refill:  0    Order Specific Question:   Supervising Provider    Answer:   Eustaquio Maize [[id number]]   Gargle with warm salt water See dentist ASAP  Mary-Margaret Hassell Done, FNP

## 2016-11-05 NOTE — Patient Instructions (Signed)
Dental Abscess A dental abscess is pus in or around a tooth. Follow these instructions at home:  Take medicines only as told by your dentist.  If you were prescribed antibiotic medicine, finish all of it even if you start to feel better.  Rinse your mouth (gargle) often with salt water.  Do not drive or use heavy machinery, like a lawn mower, while taking pain medicine.  Do not apply heat to the outside of your mouth.  Keep all follow-up visits as told by your dentist. This is important. Contact a doctor if:  Your pain is worse, and medicine does not help. Get help right away if:  You have a fever or chills.  Your symptoms suddenly get worse.  You have a very bad headache.  You have problems breathing or swallowing.  You have trouble opening your mouth.  You have puffiness (swelling) in your neck or around your eye. This information is not intended to replace advice given to you by your health care provider. Make sure you discuss any questions you have with your health care provider. Document Released: 10/09/2014 Document Revised: 10/31/2015 Document Reviewed: 05/22/2014 Elsevier Interactive Patient Education  2018 Elsevier Inc.  

## 2016-11-06 ENCOUNTER — Other Ambulatory Visit: Payer: Self-pay

## 2016-11-06 DIAGNOSIS — K047 Periapical abscess without sinus: Secondary | ICD-10-CM

## 2016-11-06 MED ORDER — CLINDAMYCIN HCL 300 MG PO CAPS: 300.0000 mg | ORAL_CAPSULE | Freq: Three times a day (TID) | ORAL | 0 refills | Status: DC

## 2016-12-31 ENCOUNTER — Other Ambulatory Visit: Payer: Self-pay | Admitting: Family Medicine

## 2017-01-05 ENCOUNTER — Encounter: Payer: Self-pay | Admitting: Family Medicine

## 2017-01-05 ENCOUNTER — Ambulatory Visit (INDEPENDENT_AMBULATORY_CARE_PROVIDER_SITE_OTHER): Payer: Medicare Other | Admitting: Family Medicine

## 2017-01-05 VITALS — BP 135/85 | HR 71 | Temp 97.4°F | Ht 70.0 in | Wt 235.0 lb

## 2017-01-05 DIAGNOSIS — F339 Major depressive disorder, recurrent, unspecified: Secondary | ICD-10-CM | POA: Diagnosis not present

## 2017-01-05 DIAGNOSIS — E7800 Pure hypercholesterolemia, unspecified: Secondary | ICD-10-CM

## 2017-01-05 DIAGNOSIS — K219 Gastro-esophageal reflux disease without esophagitis: Secondary | ICD-10-CM | POA: Diagnosis not present

## 2017-01-05 DIAGNOSIS — I82403 Acute embolism and thrombosis of unspecified deep veins of lower extremity, bilateral: Secondary | ICD-10-CM | POA: Diagnosis not present

## 2017-01-05 DIAGNOSIS — C55 Malignant neoplasm of uterus, part unspecified: Secondary | ICD-10-CM | POA: Diagnosis not present

## 2017-01-05 DIAGNOSIS — E1152 Type 2 diabetes mellitus with diabetic peripheral angiopathy with gangrene: Secondary | ICD-10-CM | POA: Diagnosis not present

## 2017-01-05 DIAGNOSIS — E559 Vitamin D deficiency, unspecified: Secondary | ICD-10-CM

## 2017-01-05 DIAGNOSIS — J449 Chronic obstructive pulmonary disease, unspecified: Secondary | ICD-10-CM

## 2017-01-05 DIAGNOSIS — I10 Essential (primary) hypertension: Secondary | ICD-10-CM

## 2017-01-05 DIAGNOSIS — I739 Peripheral vascular disease, unspecified: Secondary | ICD-10-CM

## 2017-01-05 HISTORY — DX: Acute embolism and thrombosis of unspecified deep veins of lower extremity, bilateral: I82.403

## 2017-01-05 LAB — BAYER DCA HB A1C WAIVED: HB A1C (BAYER DCA - WAIVED): 7.6 % — ABNORMAL HIGH (ref <7.0)

## 2017-01-05 MED ORDER — ESCITALOPRAM OXALATE 20 MG PO TABS: 30.0000 mg | ORAL_TABLET | Freq: Every day | ORAL | 1 refills | Status: DC

## 2017-01-05 NOTE — Addendum Note (Signed)
Addended by: Zannie Cove on: 01/05/2017 11:51 AM   Modules accepted: Orders

## 2017-01-05 NOTE — Progress Notes (Signed)
Subjective:    Patient ID: [name], female    DOB: Mar 28, 1961, 56 y.o.   MRN: [id number]  HPI  Pt here for follow up and management of chronic medical problems which includes diabetes, hypertension and gerd. She is taking medication regularly.This patient, for a shunt if she is has had a lot of health issues to deal with. She is somewhat more depressed and would like to increase the Lexapro. She will be given an FOBT to return and will get lab work today. Her vital signs are stable and her weight is down 5 pounds from last visit. The patient is complaining of some depression. She is dealing with her daughter that has depression and with a good friend has stage IV lymphoma and taking him to the doctor frequently. She denies any chest pain and no more shortness of breath than usual. She's having no problems with her intestinal tract including nausea vomiting diarrhea blood in the stool or black tarry bowel movements. There is no change in bowel habits. She is passing her water without problems. She is due to get an eye exam soon and we'll make sure that we get a copy of that report. She is passing her water without problems. Not check her blood sugars routinely at home and I encouraged her to do this. She refuses to do Pap and pelvic exams and DEXA scan as. She does occasionally get a mammogram and I encouraged her to keep doing this and continue with self breast checks.    Patient Active Problem List   Diagnosis Date Noted  . Peripheral vascular insufficiency ([place]) 09/25/2015  . Hypothyroidism 01/29/2014  . Obesity (BMI 30-39.9) 08/16/2013  . Impetigo 07/28/2013  . COPD (chronic obstructive pulmonary disease) ([place])   . Uterine cancer ([place])   . GERD (gastroesophageal reflux disease) 10/18/2012  . Hernia, hiatal 10/18/2012  . Diabetes mellitus, type 2 ([place]) 04/18/2008  . Hyperlipidemia 04/18/2008  . SMOKER 04/18/2008  . Essential hypertension 04/18/2008   Outpatient Encounter  Prescriptions as of 01/05/2017  Medication Sig  . albuterol (PROVENTIL HFA;VENTOLIN HFA) 108 (90 BASE) MCG/ACT inhaler Inhale 2 puffs into the lungs every 6 (six) hours as needed for wheezing or shortness of breath.  Marland Kitchen aspirin 325 MG tablet Take 325 mg by mouth daily.  Marland Kitchen atorvastatin (LIPITOR) 40 MG tablet TAKE 1 TABLET DAILY  . Cholecalciferol (VITAMIN D3) 5000 units TABS Take 1 tablet by mouth daily.  . clopidogrel (PLAVIX) 75 MG tablet TAKE 1 TABLET DAILY  . escitalopram (LEXAPRO) 20 MG tablet TAKE 1 TABLET DAILY  . furosemide (LASIX) 20 MG tablet TAKE 1 TABLET DAILY  . levothyroxine (SYNTHROID, LEVOTHROID) 100 MCG tablet TAKE (1) TABLET DAILY BE- FORE BREAKFAST.  Marland Kitchen losartan (COZAAR) 50 MG tablet TAKE 1 TABLET DAILY  . metFORMIN (GLUCOPHAGE-XR) 500 MG 24 hr tablet TAKE 2 TABLET ONCE DAILY WITH BREAKFAST  . mupirocin ointment (BACTROBAN) 2 % APPLY TO THE NOSE AS DIRECTED 2 TIMES A DAY  . omeprazole (PRILOSEC) 20 MG capsule TAKE (1) CAPSULE DAILY  . ONE TOUCH ULTRA TEST test strip TEST BLOOD SUGARS TWICE A DAY  . Potassium Gluconate 595 MG CAPS Take 1 capsule by mouth daily.  Marland Kitchen tretinoin (RETIN-A) 0.025 % cream Apply topically at bedtime.  Marland Kitchen umeclidinium bromide (INCRUSE ELLIPTA) 62.5 MCG/INH AEPB Inhale 1 puff into the lungs daily.  Marland Kitchen warfarin (COUMADIN) 5 MG tablet TAKE AS DIRECTED UP TO 1 1/2 TABLETS A DAY  . [DISCONTINUED] clindamycin (CLEOCIN) 300 MG  capsule Take 1 capsule (300 mg total) by mouth 3 (three) times daily.  . [DISCONTINUED] HYDROcodone-acetaminophen (LORTAB) 5-325 MG tablet Take 1 tablet by mouth every 6 (six) hours as needed for moderate pain.   No facility-administered encounter medications on file as of 01/05/2017.       Review of Systems  Constitutional: Negative.   HENT: Negative.   Eyes: Negative.   Respiratory: Negative.   Cardiovascular: Negative.   Gastrointestinal: Negative.   Endocrine: Negative.   Genitourinary: Negative.   Musculoskeletal: Negative.     Skin: Negative.   Allergic/Immunologic: Negative.   Neurological: Negative.   Hematological: Negative.   Psychiatric/Behavioral: Negative.        Depressed       Objective:   Physical Exam  Constitutional: She is oriented to person, place, and time. She appears well-developed and well-nourished. No distress.  The patient is pleasant and alert and due to her off her health issues and her cigarette smoking appears older than her stated age.  HENT:  Head: Normocephalic and atraumatic.  Right Ear: External ear normal.  Left Ear: External ear normal.  Nose: Nose normal.  Mouth/Throat: Oropharynx is clear and moist. No oropharyngeal exudate.  Eyes: Pupils are equal, round, and reactive to light. Conjunctivae and EOM are normal. Right eye exhibits no discharge. Left eye exhibits no discharge. No scleral icterus.  Patient is aware that she needs to get her eye exam yearly  Neck: Normal range of motion. Neck supple. No thyromegaly present.  No bruits or thyromegaly  Cardiovascular: Normal rate and regular rhythm.   No murmur heard. Heart is regular at 72/m, no pulses are palpable in either foot.  Pulmonary/Chest: Effort normal. No respiratory distress. She has no wheezes. She has no rales. She exhibits no tenderness.  Clear anteriorly and posteriorly but with coughing a coarse tight cough.  Abdominal: Soft. Bowel sounds are normal. She exhibits no mass. There is no tenderness. There is no rebound and no guarding.  Abdominal obesity without masses tenderness or organ enlargement or bruits  Musculoskeletal: Normal range of motion. She exhibits no edema.  Missing the fourth and fifth toes of the right foot  Lymphadenopathy:    She has no cervical adenopathy.  Neurological: She is alert and oriented to person, place, and time. She has normal reflexes. No cranial nerve deficit.  Skin: Skin is warm and dry. No rash noted.  Psychiatric: She has a normal mood and affect. Her behavior is normal.  Judgment and thought content normal.  Nursing note and vitals reviewed.  BP 135/85 (BP Location: Left Arm)   Pulse 71   Temp (!) 97.4 F (36.3 C) (Oral)   Ht 5' 10"  (1.778 m)   Wt 235 lb (106.6 kg)   BMI 33.72 kg/m         Assessment & Plan:  1. Type 2 diabetes mellitus with diabetic peripheral angiopathy and gangrene, without long-term current use of insulin (HCC) -Hopefully with weight loss and exercise the A1c will be improved. She will try to make an effort to check more blood sugars at home fasting and during the day. - CBC with Differential/Platelet - Bayer DCA Hb A1c Waived  2. Essential hypertension -The blood pressure is good and she will continue with current treatment - CBC with Differential/Platelet - BMP8+EGFR - Hepatic function panel  3. Gastroesophageal reflux disease, esophagitis presence not specified -Continue to watch diet closely and work on weight loss. - CBC with Differential/Platelet - Hepatic function panel  4. Pure hypercholesterolemia -Continue with atorvastatin and aggressive therapeutic lifestyle changes - CBC with Differential/Platelet - Lipid panel  5. Vitamin D deficiency -Continue with vitamin D replacement pending results of lab work - CBC with Differential/Platelet - VITAMIN D 25 Hydroxy (Vit-D Deficiency, Fractures)  6. Chronic obstructive pulmonary disease, unspecified COPD type ([place]) -Continue with inhalers and make every effort to stop smoking  7. Peripheral vascular insufficiency (HCC) -Continue to walk and exercise as much as possible as this will improve circulation. Patient will schedule appointment to see vascular surgeon if any symptoms get worse with her lower extremity.  8. Deep vein thrombosis (DVT) of both lower extremities, unspecified chronicity, unspecified vein (HCC) -Continue with Coumadin  9. Malignant neoplasm of uterus, unspecified site [facility]) -Patient refuses to get pelvic exam and have internal exam.  10.  Depression -Increase Lexapro to 30 mg daily  No orders of the defined types were placed in this encounter.  Patient Instructions  Continue current medications. Continue good therapeutic lifestyle changes which include good diet and exercise. Fall precautions discussed with patient. If an FOBT was given today- please return it to our front desk. If you are over 80 years old - you may need Prevnar 2 or the adult Pneumonia vaccine.  **Flu shots are available--- please call and schedule a FLU-CLINIC appointment**  After your visit with [place] today you will receive a survey in the mail or online from [employer] regarding your care with [place]. Please take a moment to fill this out. Your feedback is very important to [place] as you can help [place] better understand your patient needs as well as improve your experience and satisfaction. WE CARE ABOUT YOU!!!   We will call with lab work results as soon as these results become available Stay active physically Check blood sugars periodically Drink plenty of fluids and stay well hydrated Check feet regularly Follow-up with vascular surgeon as needed Continue walking Try to make an effort to really stop smoking Increase Lexapro to 30 mg daily    Arrie Senate MD

## 2017-01-05 NOTE — Patient Instructions (Addendum)
Continue current medications. Continue good therapeutic lifestyle changes which include good diet and exercise. Fall precautions discussed with patient. If an FOBT was given today- please return it to our front desk. If you are over 54 years old - you may need Prevnar 33 or the adult Pneumonia vaccine.  **Flu shots are available--- please call and schedule a FLU-CLINIC appointment**  After your visit with [place] today you will receive a survey in the mail or online from [employer] regarding your care with [place]. Please take a moment to fill this out. Your feedback is very important to [place] as you can help [place] better understand your patient needs as well as improve your experience and satisfaction. WE CARE ABOUT YOU!!!   We will call with lab work results as soon as these results become available Stay active physically Check blood sugars periodically Drink plenty of fluids and stay well hydrated Check feet regularly Follow-up with vascular surgeon as needed Continue walking Try to make an effort to really stop smoking Increase Lexapro to 30 mg daily

## 2017-01-06 LAB — CBC WITH DIFFERENTIAL/PLATELET
BASOS ABS: 0 10*3/uL (ref 0.0–0.2)
Basos: 0 %
EOS (ABSOLUTE): 0.2 10*3/uL (ref 0.0–0.4)
Eos: 2 %
Hematocrit: 44.5 % (ref 34.0–46.6)
Hemoglobin: 14.5 g/dL (ref 11.1–15.9)
Immature Grans (Abs): 0 10*3/uL (ref 0.0–0.1)
Immature Granulocytes: 0 %
LYMPHS ABS: 3.1 10*3/uL (ref 0.7–3.1)
Lymphs: 31 %
MCH: 29.1 pg (ref 26.6–33.0)
MCHC: 32.6 g/dL (ref 31.5–35.7)
MCV: 89 fL (ref 79–97)
MONOS ABS: 0.5 10*3/uL (ref 0.1–0.9)
Monocytes: 5 %
Neutrophils Absolute: 6.1 10*3/uL (ref 1.4–7.0)
Neutrophils: 62 %
Platelets: 214 10*3/uL (ref 150–379)
RBC: 4.99 x10E6/uL (ref 3.77–5.28)
RDW: 14.5 % (ref 12.3–15.4)
WBC: 9.9 10*3/uL (ref 3.4–10.8)

## 2017-01-06 LAB — LIPID PANEL
CHOL/HDL RATIO: 5.9 ratio — AB (ref 0.0–4.4)
CHOLESTEROL TOTAL: 177 mg/dL (ref 100–199)
HDL: 30 mg/dL — ABNORMAL LOW (ref >39)
LDL Calculated: 100 mg/dL — ABNORMAL HIGH (ref 0–99)
TRIGLYCERIDES: 234 mg/dL — AB (ref 0–149)
VLDL Cholesterol Cal: 47 mg/dL — ABNORMAL HIGH (ref 5–40)

## 2017-01-06 LAB — BMP8+EGFR
BUN / CREAT RATIO: 17 (ref 9–23)
BUN: 12 mg/dL (ref 6–24)
CO2: 25 mmol/L (ref 20–29)
Calcium: 9.4 mg/dL (ref 8.7–10.2)
Chloride: 100 mmol/L (ref 96–106)
Creatinine, Ser: 0.7 mg/dL (ref 0.57–1.00)
GFR calc Af Amer: 114 mL/min/{1.73_m2} (ref >59)
GFR, EST NON AFRICAN AMERICAN: 99 mL/min/{1.73_m2} (ref >59)
GLUCOSE: 185 mg/dL — AB (ref 65–99)
POTASSIUM: 4.4 mmol/L (ref 3.5–5.2)
SODIUM: 140 mmol/L (ref 134–144)

## 2017-01-06 LAB — HEPATIC FUNCTION PANEL
ALBUMIN: 3.6 g/dL (ref 3.5–5.5)
ALT: 16 IU/L (ref 0–32)
AST: 17 IU/L (ref 0–40)
Alkaline Phosphatase: 59 IU/L (ref 39–117)
BILIRUBIN TOTAL: 0.3 mg/dL (ref 0.0–1.2)
Bilirubin, Direct: 0.09 mg/dL (ref 0.00–0.40)
TOTAL PROTEIN: 6.7 g/dL (ref 6.0–8.5)

## 2017-01-06 LAB — VITAMIN D 25 HYDROXY (VIT D DEFICIENCY, FRACTURES): Vit D, 25-Hydroxy: 57.5 ng/mL (ref 30.0–100.0)

## 2017-01-20 ENCOUNTER — Other Ambulatory Visit: Payer: Self-pay | Admitting: Family Medicine

## 2017-03-18 ENCOUNTER — Telehealth: Payer: Self-pay | Admitting: Family Medicine

## 2017-03-18 NOTE — Telephone Encounter (Signed)
PT AWARE APPT MADE

## 2017-03-18 NOTE — Telephone Encounter (Signed)
Needs  To be seen for antibiotic

## 2017-03-19 ENCOUNTER — Ambulatory Visit (INDEPENDENT_AMBULATORY_CARE_PROVIDER_SITE_OTHER): Payer: Medicare Other | Admitting: Family Medicine

## 2017-03-19 ENCOUNTER — Encounter: Payer: Self-pay | Admitting: Family Medicine

## 2017-03-19 VITALS — BP 130/79 | HR 80 | Temp 97.5°F | Ht 70.0 in | Wt 229.0 lb

## 2017-03-19 DIAGNOSIS — L723 Sebaceous cyst: Secondary | ICD-10-CM | POA: Diagnosis not present

## 2017-03-19 DIAGNOSIS — L089 Local infection of the skin and subcutaneous tissue, unspecified: Secondary | ICD-10-CM | POA: Diagnosis not present

## 2017-03-19 MED ORDER — CLINDAMYCIN HCL 300 MG PO CAPS: 300.0000 mg | ORAL_CAPSULE | Freq: Four times a day (QID) | ORAL | 0 refills | Status: AC

## 2017-03-19 NOTE — Patient Instructions (Signed)
The lesion on her neck appears to be an infected sebaceous cyst of her skin. I do recommend that you continue applying warm compresses 4-5 times daily. I have also prescribed an oral antibiotic to take 4 times a day for the next 5 days.  If you develop severe diarrhea or rash from this medication please give [place] a call.  I do not feel fluid that is able to be drained today. However, If the lesion on your neck is not improving by Monday, I recommend that you call for an appointment for incision and drainage of this lesion.   Epidermal Cyst An epidermal cyst is a small, painless lump under your skin. It may be called an epidermal inclusion cyst or an infundibular cyst. The cyst contains a grayish-white, bad-smelling substance (keratin). It is important not to pop epidermal cysts yourself. These cysts are usually harmless (benign), but they can get infected. Symptoms of infection may include:  Redness.  Inflammation.  Tenderness.  Warmth.  Fever.  A grayish-white, bad-smelling substance draining from the cyst.  Pus draining from the cyst.  Follow these instructions at home:  Take over-the-counter and prescription medicines only as told by your doctor.  If you were prescribed an antibiotic, use it as told by your doctor. Do not stop using the antibiotic even if you start to feel better.  Keep the area around your cyst clean and dry.  Wear loose, dry clothing.  Do not try to pop your cyst.  Avoid touching your cyst.  Check your cyst every day for signs of infection.  Keep all follow-up visits as told by your doctor. This is important. How is this prevented?  Wear clean, dry, clothing.  Avoid wearing tight clothing.  Keep your skin clean and dry. Shower or take baths every day.  Wash your body with a benzoyl peroxide wash when you shower or bathe. Contact a health care provider if:  Your cyst has symptoms of infection.  Your condition is not improving or is getting  worse.  You have a cyst that looks different from other cysts you have had.  You have a fever. Get help right away if:  Redness spreads from the cyst into the surrounding area. This information is not intended to replace advice given to you by your health care provider. Make sure you discuss any questions you have with your health care provider. Document Released: 07/02/2004 Document Revised: 01/22/2016 Document Reviewed: 03/27/2015 Elsevier Interactive Patient Education  [employer].
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
## 2017-04-08 ENCOUNTER — Other Ambulatory Visit: Payer: Self-pay | Admitting: Family Medicine

## 2017-04-12 ENCOUNTER — Other Ambulatory Visit: Payer: Self-pay | Admitting: Family Medicine

## 2017-05-06 ENCOUNTER — Other Ambulatory Visit: Payer: Self-pay | Admitting: Family Medicine

## 2017-05-07 NOTE — Telephone Encounter (Signed)
Last seen 10/18   Last thyroid 12/17/14

## 2017-05-11 ENCOUNTER — Ambulatory Visit (INDEPENDENT_AMBULATORY_CARE_PROVIDER_SITE_OTHER): Payer: Medicare Other | Admitting: Family Medicine

## 2017-05-11 ENCOUNTER — Encounter: Payer: Self-pay | Admitting: Family Medicine

## 2017-05-11 VITALS — BP 123/68 | HR 66 | Temp 97.1°F | Ht 70.0 in | Wt 228.0 lb

## 2017-05-11 DIAGNOSIS — E1152 Type 2 diabetes mellitus with diabetic peripheral angiopathy with gangrene: Secondary | ICD-10-CM | POA: Diagnosis not present

## 2017-05-11 DIAGNOSIS — C549 Malignant neoplasm of corpus uteri, unspecified: Secondary | ICD-10-CM | POA: Diagnosis not present

## 2017-05-11 DIAGNOSIS — J449 Chronic obstructive pulmonary disease, unspecified: Secondary | ICD-10-CM

## 2017-05-11 DIAGNOSIS — I739 Peripheral vascular disease, unspecified: Secondary | ICD-10-CM | POA: Diagnosis not present

## 2017-05-11 DIAGNOSIS — I82403 Acute embolism and thrombosis of unspecified deep veins of lower extremity, bilateral: Secondary | ICD-10-CM | POA: Diagnosis not present

## 2017-05-11 DIAGNOSIS — E7800 Pure hypercholesterolemia, unspecified: Secondary | ICD-10-CM

## 2017-05-11 DIAGNOSIS — K219 Gastro-esophageal reflux disease without esophagitis: Secondary | ICD-10-CM | POA: Diagnosis not present

## 2017-05-11 DIAGNOSIS — I10 Essential (primary) hypertension: Secondary | ICD-10-CM

## 2017-05-11 DIAGNOSIS — E559 Vitamin D deficiency, unspecified: Secondary | ICD-10-CM | POA: Diagnosis not present

## 2017-05-11 LAB — BAYER DCA HB A1C WAIVED: HB A1C (BAYER DCA - WAIVED): 7.6 % — ABNORMAL HIGH (ref <7.0)

## 2017-05-11 MED ORDER — TRETINOIN 0.05 % EX CREA: TOPICAL_CREAM | Freq: Every day | CUTANEOUS | 3 refills | Status: DC

## 2017-05-11 NOTE — Progress Notes (Signed)
Subjective:    Patient ID: [name], female    DOB: 1961-04-07, 56 y.o.   MRN: [id number]  HPI Pt here for follow up and management of chronic medical problems which includes hyperlipidemia, diabetes. and hypertension. She is taking medication regularly.  Is doing well today with no specific complaints.  She is requesting refills on her Retin-A.  She refuses to get a Pap smear mammogram or DEXA scan.  She will be given an FOBT to return and will get lab work today.  This patient has multiple issues including malignant neoplasm of the uterus peripheral vascular insufficiency type 2 diabetes and chronic obstructive pulmonary disease along with chronic DVTs of both lower extremities.  The patient says she feels the best that she is ever felt but is still smoking a half a pack of cigarettes daily.  She does not check her blood sugars regularly because she can tell when her blood sugars up or down and she only checks it during that time.  She denies any chest pain or shortness of breath.  She denies any trouble with swallowing heartburn indigestion nausea vomiting diarrhea blood in the stool black tarry bowel movements or change in bowel habits.  She says she is passing her water without problems.    Patient Active Problem List   Diagnosis Date Noted  . Deep vein thrombosis (DVT) of both lower extremities ([place]) 01/05/2017  . Peripheral vascular insufficiency ([place]) 09/25/2015  . Hypothyroidism 01/29/2014  . Obesity (BMI 30-39.9) 08/16/2013  . Impetigo 07/28/2013  . Chronic obstructive pulmonary disease ([place])   . Malignant neoplasm of uterus ([place])   . GERD (gastroesophageal reflux disease) 10/18/2012  . Hernia, hiatal 10/18/2012  . Diabetes mellitus, type 2 ([place]) 04/18/2008  . Hyperlipidemia 04/18/2008  . SMOKER 04/18/2008  . Essential hypertension 04/18/2008   Outpatient Encounter Medications as of 05/11/2017  Medication Sig  . albuterol (PROVENTIL HFA;VENTOLIN HFA) 108 (90 BASE)  MCG/ACT inhaler Inhale 2 puffs into the lungs every 6 (six) hours as needed for wheezing or shortness of breath.  Marland Kitchen aspirin 325 MG tablet Take 325 mg by mouth daily.  Marland Kitchen atorvastatin (LIPITOR) 40 MG tablet TAKE 1 TABLET DAILY  . Cholecalciferol (VITAMIN D3) 5000 units TABS Take 1 tablet by mouth daily.  . clopidogrel (PLAVIX) 75 MG tablet TAKE 1 TABLET DAILY  . escitalopram (LEXAPRO) 20 MG tablet Take 1.5 tablets (30 mg total) by mouth daily.  . furosemide (LASIX) 20 MG tablet TAKE 1 TABLET DAILY  . levothyroxine (SYNTHROID, LEVOTHROID) 100 MCG tablet TAKE (1) TABLET DAILY BE- FORE BREAKFAST.  Marland Kitchen losartan (COZAAR) 50 MG tablet TAKE 1 TABLET DAILY  . metFORMIN (GLUCOPHAGE-XR) 500 MG 24 hr tablet TAKE 2 TABLET ONCE DAILY WITH BREAKFAST  . mupirocin ointment (BACTROBAN) 2 % APPLY TO THE NOSE AS DIRECTED 2 TIMES A DAY  . omeprazole (PRILOSEC) 20 MG capsule TAKE (1) CAPSULE DAILY  . ONE TOUCH ULTRA TEST test strip TEST BLOOD SUGARS TWICE A DAY  . Potassium Gluconate 595 MG CAPS Take 1 capsule by mouth daily.  Marland Kitchen tretinoin (RETIN-A) 0.025 % cream Apply topically at bedtime.  Marland Kitchen umeclidinium bromide (INCRUSE ELLIPTA) 62.5 MCG/INH AEPB Inhale 1 puff into the lungs daily.  Marland Kitchen warfarin (COUMADIN) 5 MG tablet TAKE AS DIRECTED UP TO 1 & 1/2 TABLETS A DAY   No facility-administered encounter medications on file as of 05/11/2017.       Review of Systems  Constitutional: Negative.   HENT: Negative.   Eyes:  Negative.   Respiratory: Negative.   Cardiovascular: Negative.   Gastrointestinal: Negative.   Endocrine: Negative.   Genitourinary: Negative.   Musculoskeletal: Negative.   Skin: Negative.   Allergic/Immunologic: Negative.   Neurological: Negative.   Hematological: Negative.   Psychiatric/Behavioral: Negative.        Objective:   Physical Exam  Constitutional: She is oriented to person, place, and time. She appears well-developed and well-nourished.  Patient is pleasant and alert  HENT:    Head: Normocephalic and atraumatic.  Right Ear: External ear normal.  Left Ear: External ear normal.  Mouth/Throat: Oropharynx is clear and moist. No oropharyngeal exudate.  Nasal passages are dry  Eyes: Conjunctivae and EOM are normal. Pupils are equal, round, and reactive to light. Right eye exhibits no discharge. Left eye exhibits no discharge. No scleral icterus.  Patient is aware that she needs to get an eye exam  Neck: Normal range of motion. Neck supple. No thyromegaly present.  No bruits thyromegaly or anterior cervical adenopathy  Cardiovascular: Normal rate, regular rhythm and normal heart sounds.  No murmur heard. The heart is regular at 72/min distal pulses were not palpable  Pulmonary/Chest: Effort normal. No respiratory distress. She has no wheezes. She has no rales.  Clear anteriorly and posteriorly with a coarse cough  Abdominal: Soft. Bowel sounds are normal. She exhibits no mass. There is no tenderness. There is no rebound and no guarding.  No masses tenderness or organ enlargement or bruits  Musculoskeletal: Normal range of motion. She exhibits no edema.  The patient has had the fourth and fifth toes of the right foot amputated.  There is nail fungus bilaterally.  Lymphadenopathy:    She has no cervical adenopathy.  Neurological: She is alert and oriented to person, place, and time. She has normal reflexes. No cranial nerve deficit.  Skin: Skin is warm and dry. Rash noted. There is erythema.  There is excoriated area on the posterior chest beneath the neck.  Psychiatric: She has a normal mood and affect. Her behavior is normal. Judgment and thought content normal.  Nursing note and vitals reviewed.  BP 123/68 (BP Location: Left Arm)   Pulse 66   Temp (!) 97.1 F (36.2 C) (Oral)   Ht _0  (1.778 m)   Wt 228 lb (103.4 kg)   BMI 32.71 kg/m        Assessment & Plan:  1. Type 2 diabetes mellitus with diabetic peripheral angiopathy and gangrene, without  long-term current use of insulin (HCC) -Continue to follow diet aggressively and exercise and walk as much as possible no treatment changes pending results of lab work - [place] Hb A1c Waived - CBC with Differential/Platelet  2. Gastroesophageal reflux disease, esophagitis presence not specified -No complaints today and patient will continue with current treatment - CBC with Differential/Platelet - Hepatic function panel  3. Essential hypertension -Blood pressure is good today and she will continue with current treatment - BMP8+EGFR - CBC with Differential/Platelet - Hepatic function panel  4. Pure hypercholesterolemia -Continue with current treatment of atorvastatin and aggressive therapeutic lifestyle changes pending results of lab work - Lipid panel - CBC with Differential/Platelet  5. Vitamin D deficiency -Continue with current treatment pending results of lab work - CBC with Differential/Platelet - VITAMIN D 25 Hydroxy (Vit-D Deficiency, Fractures)  6. Chronic obstructive pulmonary disease, unspecified COPD type ([place]) -Continue to make every effort at smoking cessation. - CBC with Differential/Platelet  7. Malignant neoplasm of body of uterus, unspecified site (  [place]) -Pelvic exams regularly  8. Deep vein thrombosis (DVT) of both lower extremities, unspecified chronicity, unspecified vein (HCC) -Continue with Plavix and follow-up with vascular surgeon as needed  9. Peripheral vascular insufficiency ([place]) -Follow-up with vascular surgeon and continue to walk regularly  10. Intermittent claudication (HCC) -Continue to walk and exercise regularly and try to stop smoking completely  No orders of the defined types were placed in this encounter.  Patient Instructions                       Medicare Annual Wellness Visit  [facility] and the medical providers at [facility] strive to bring you the best medical care.  In doing so we not only want to  address your current medical conditions and concerns but also to detect new conditions early and prevent illness, disease and health-related problems.    Medicare offers a yearly Wellness Visit which allows our clinical staff to assess your need for preventative services including immunizations, lifestyle education, counseling to decrease risk of preventable diseases and screening for fall risk and other medical concerns.    This visit is provided free of charge (no copay) for all Medicare recipients. The clinical pharmacists at [facility] have begun to conduct these Wellness Visits which will also include a thorough review of all your medications.    As you primary medical provider recommend that you make an appointment for your Annual Wellness Visit if you have not done so already this year.  You may set up this appointment before you leave today or you may call back ([phone]) and schedule an appointment.  Please make sure when you call that you mention that you are scheduling your Annual Wellness Visit with the clinical pharmacist so that the appointment may be made for the proper length of time.     Continue current medications. Continue good therapeutic lifestyle changes which include good diet and exercise. Fall precautions discussed with patient. If an FOBT was given today- please return it to our front desk. If you are over 82 years old - you may need Prevnar 71 or the adult Pneumonia vaccine.  **Flu shots are available--- please call and schedule a FLU-CLINIC appointment**  After your visit with [place] today you will receive a survey in the mail or online from [employer] regarding your care with [place]. Please take a moment to fill this out. Your feedback is very important to [place] as you can help [place] better understand your patient needs as well as improve your experience and satisfaction. WE CARE ABOUT YOU!!!   Continue to work on smoking cessation Reconsider getting a flu  shot and Prevnar vaccine We will call you with lab work results as soon as those results are returned as well as the FOBT when she returned this. Do not forget to get your eye exam.  This is very important.  When you get this please make sure they send [place] a copy of that report. If you change your mind on getting the flu shot or the Prevnar vaccine please let us know We will also make sure you have refills on your rescue inhaler.    Arrie Senate MD

## 2017-05-11 NOTE — Patient Instructions (Addendum)
Medicare Annual Wellness Visit  [facility] and the medical providers at [facility] strive to bring you the best medical care.  In doing so we not only want to address your current medical conditions and concerns but also to detect new conditions early and prevent illness, disease and health-related problems.    Medicare offers a yearly Wellness Visit which allows our clinical staff to assess your need for preventative services including immunizations, lifestyle education, counseling to decrease risk of preventable diseases and screening for fall risk and other medical concerns.    This visit is provided free of charge (no copay) for all Medicare recipients. The clinical pharmacists at [facility] have begun to conduct these Wellness Visits which will also include a thorough review of all your medications.    As you primary medical provider recommend that you make an appointment for your Annual Wellness Visit if you have not done so already this year.  You may set up this appointment before you leave today or you may call back ([phone]) and schedule an appointment.  Please make sure when you call that you mention that you are scheduling your Annual Wellness Visit with the clinical pharmacist so that the appointment may be made for the proper length of time.     Continue current medications. Continue good therapeutic lifestyle changes which include good diet and exercise. Fall precautions discussed with patient. If an FOBT was given today- please return it to our front desk. If you are over 52 years old - you may need Prevnar 83 or the adult Pneumonia vaccine.  **Flu shots are available--- please call and schedule a FLU-CLINIC appointment**  After your visit with [place] today you will receive a survey in the mail or online from [employer] regarding your care with [place]. Please take a moment to fill this out. Your feedback is very  important to [place] as you can help [place] better understand your patient needs as well as improve your experience and satisfaction. WE CARE ABOUT YOU!!!   Continue to work on smoking cessation Reconsider getting a flu shot and Prevnar vaccine We will call you with lab work results as soon as those results are returned as well as the FOBT when she returned this. Do not forget to get your eye exam.  This is very important.  When you get this please make sure they send [place] a copy of that report. If you change your mind on getting the flu shot or the Prevnar vaccine please let us know We will also make sure you have refills on your rescue inhaler.

## 2017-05-11 NOTE — Addendum Note (Signed)
Addended by: Zannie Cove on: 05/11/2017 10:50 AM   Modules accepted: Orders

## 2017-05-12 ENCOUNTER — Other Ambulatory Visit: Payer: Self-pay | Admitting: *Deleted

## 2017-05-12 LAB — SPECIMEN STATUS REPORT

## 2017-05-12 MED ORDER — ICOSAPENT ETHYL 1 G PO CAPS: 2.0000 g | ORAL_CAPSULE | Freq: Every day | ORAL | 3 refills | Status: DC

## 2017-05-13 LAB — LIPID PANEL
CHOLESTEROL TOTAL: 186 mg/dL (ref 100–199)
Chol/HDL Ratio: 6.2 ratio — ABNORMAL HIGH (ref 0.0–4.4)
HDL: 30 mg/dL — ABNORMAL LOW (ref >39)
LDL Calculated: 112 mg/dL — ABNORMAL HIGH (ref 0–99)
TRIGLYCERIDES: 221 mg/dL — AB (ref 0–149)
VLDL Cholesterol Cal: 44 mg/dL — ABNORMAL HIGH (ref 5–40)

## 2017-05-13 LAB — CBC WITH DIFFERENTIAL/PLATELET
BASOS: 0 %
Basophils Absolute: 0 10*3/uL (ref 0.0–0.2)
EOS (ABSOLUTE): 0.2 10*3/uL (ref 0.0–0.4)
Eos: 1 %
Hematocrit: 45.7 % (ref 34.0–46.6)
Hemoglobin: 15.2 g/dL (ref 11.1–15.9)
IMMATURE GRANS (ABS): 0 10*3/uL (ref 0.0–0.1)
IMMATURE GRANULOCYTES: 0 %
LYMPHS: 35 %
Lymphocytes Absolute: 3.9 10*3/uL — ABNORMAL HIGH (ref 0.7–3.1)
MCH: 30.3 pg (ref 26.6–33.0)
MCHC: 33.3 g/dL (ref 31.5–35.7)
MCV: 91 fL (ref 79–97)
MONOCYTES: 4 %
MONOS ABS: 0.4 10*3/uL (ref 0.1–0.9)
NEUTROS ABS: 6.6 10*3/uL (ref 1.4–7.0)
NEUTROS PCT: 60 %
PLATELETS: 236 10*3/uL (ref 150–379)
RBC: 5.02 x10E6/uL (ref 3.77–5.28)
RDW: 14.8 % (ref 12.3–15.4)
WBC: 11.1 10*3/uL — ABNORMAL HIGH (ref 3.4–10.8)

## 2017-05-13 LAB — HEPATIC FUNCTION PANEL
ALBUMIN: 4 g/dL (ref 3.5–5.5)
ALT: 15 IU/L (ref 0–32)
AST: 17 IU/L (ref 0–40)
Alkaline Phosphatase: 64 IU/L (ref 39–117)
BILIRUBIN TOTAL: 0.3 mg/dL (ref 0.0–1.2)
Bilirubin, Direct: 0.09 mg/dL (ref 0.00–0.40)
TOTAL PROTEIN: 7.1 g/dL (ref 6.0–8.5)

## 2017-05-13 LAB — BMP8+EGFR
BUN/Creatinine Ratio: 18 (ref 9–23)
BUN: 12 mg/dL (ref 6–24)
CALCIUM: 9.4 mg/dL (ref 8.7–10.2)
CO2: 23 mmol/L (ref 20–29)
Chloride: 105 mmol/L (ref 96–106)
Creatinine, Ser: 0.68 mg/dL (ref 0.57–1.00)
GFR, EST AFRICAN AMERICAN: 115 mL/min/{1.73_m2} (ref >59)
GFR, EST NON AFRICAN AMERICAN: 99 mL/min/{1.73_m2} (ref >59)
Glucose: 152 mg/dL — ABNORMAL HIGH (ref 65–99)
Potassium: 4.4 mmol/L (ref 3.5–5.2)
Sodium: 144 mmol/L (ref 134–144)

## 2017-05-13 LAB — VITAMIN D 25 HYDROXY (VIT D DEFICIENCY, FRACTURES): VIT D 25 HYDROXY: 59.7 ng/mL (ref 30.0–100.0)

## 2017-07-13 ENCOUNTER — Encounter: Payer: Self-pay | Admitting: Gastroenterology

## 2017-07-29 ENCOUNTER — Other Ambulatory Visit: Payer: Self-pay | Admitting: Family Medicine

## 2017-08-23 ENCOUNTER — Other Ambulatory Visit: Payer: Self-pay | Admitting: Family Medicine

## 2017-09-10 ENCOUNTER — Encounter: Payer: Self-pay | Admitting: Family Medicine

## 2017-09-10 ENCOUNTER — Ambulatory Visit (INDEPENDENT_AMBULATORY_CARE_PROVIDER_SITE_OTHER): Payer: Medicare Other | Admitting: Family Medicine

## 2017-09-10 VITALS — BP 111/65 | HR 74 | Temp 97.7°F | Ht 70.0 in | Wt 231.0 lb

## 2017-09-10 DIAGNOSIS — E7800 Pure hypercholesterolemia, unspecified: Secondary | ICD-10-CM

## 2017-09-10 DIAGNOSIS — I739 Peripheral vascular disease, unspecified: Secondary | ICD-10-CM | POA: Diagnosis not present

## 2017-09-10 DIAGNOSIS — E1152 Type 2 diabetes mellitus with diabetic peripheral angiopathy with gangrene: Secondary | ICD-10-CM

## 2017-09-10 DIAGNOSIS — I10 Essential (primary) hypertension: Secondary | ICD-10-CM | POA: Diagnosis not present

## 2017-09-10 DIAGNOSIS — C549 Malignant neoplasm of corpus uteri, unspecified: Secondary | ICD-10-CM

## 2017-09-10 DIAGNOSIS — J449 Chronic obstructive pulmonary disease, unspecified: Secondary | ICD-10-CM | POA: Diagnosis not present

## 2017-09-10 DIAGNOSIS — K219 Gastro-esophageal reflux disease without esophagitis: Secondary | ICD-10-CM | POA: Diagnosis not present

## 2017-09-10 DIAGNOSIS — E559 Vitamin D deficiency, unspecified: Secondary | ICD-10-CM | POA: Diagnosis not present

## 2017-09-10 LAB — BAYER DCA HB A1C WAIVED: HB A1C: 7.6 % — AB (ref <7.0)

## 2017-09-10 NOTE — Patient Instructions (Addendum)
Medicare Annual Wellness Visit  [facility] and the medical providers at [facility] strive to bring you the best medical care.  In doing so we not only want to address your current medical conditions and concerns but also to detect new conditions early and prevent illness, disease and health-related problems.    Medicare offers a yearly Wellness Visit which allows our clinical staff to assess your need for preventative services including immunizations, lifestyle education, counseling to decrease risk of preventable diseases and screening for fall risk and other medical concerns.    This visit is provided free of charge (no copay) for all Medicare recipients. The clinical pharmacists at [facility] have begun to conduct these Wellness Visits which will also include a thorough review of all your medications.    As you primary medical provider recommend that you make an appointment for your Annual Wellness Visit if you have not done so already this year.  You may set up this appointment before you leave today or you may call back ([phone]) and schedule an appointment.  Please make sure when you call that you mention that you are scheduling your Annual Wellness Visit with the clinical pharmacist so that the appointment may be made for the proper length of time.     Continue current medications. Continue good therapeutic lifestyle changes which include good diet and exercise. Fall precautions discussed with patient. If an FOBT was given today- please return it to our front desk. If you are over 32 years old - you may need Prevnar 23 or the adult Pneumonia vaccine.  **Flu shots are available--- please call and schedule a FLU-CLINIC appointment**  After your visit with [place] today you will receive a survey in the mail or online from [employer] regarding your care with [place]. Please take a moment to fill this out. Your feedback is very  important to [place] as you can help [place] better understand your patient needs as well as improve your experience and satisfaction. WE CARE ABOUT YOU!!!   Continue with current treatment Continue to make all efforts at stopping cigarette smoking If problems continue with the patient will call back and we will call in a prescription for antibiotics for this. Stay well-hydrated and drink plenty of fluids Walk as much as possible to improve collateral circulation Because of diabetes keep feet checked regularly

## 2017-09-10 NOTE — Progress Notes (Signed)
Subjective:    Patient ID: [name], female    DOB: June 17, 1960, 57 y.o.   MRN: [id number]  HPI Pt here for follow up and management of chronic medical problems which includes diabetes, gerd, hypertension and hyperlipidemia. She is taking medication regularly.  Patient today complains of fluid around the right eye.  She is aware of the fact that she is due to get a pelvic exam mammogram and DEXA scan.  She will be given an FOBT to return and will have lab work drawn today.  Patient is pleasant and doing well and continues to smoke greater than 1 pack of cigarettes a day.  She does not see the vascular surgeon unless she has continued problems.  She has ongoing peripheral vascular insufficiency associated with diabetes and has had 2 toes removed from the right foot.  She denies any chest pain or shortness of breath.  She denies any trouble with her stomach as long as she takes her Prilosec or omeprazole.  She denies any blood in the stool black tarry bowel movements or change in bowel habits.  She is passing her water without problems.    Patient Active Problem List   Diagnosis Date Noted  . Intermittent claudication ([place]) 05/11/2017  . Deep vein thrombosis (DVT) of both lower extremities ([place]) 01/05/2017  . Peripheral vascular insufficiency ([place]) 09/25/2015  . Hypothyroidism 01/29/2014  . Obesity (BMI 30-39.9) 08/16/2013  . Impetigo 07/28/2013  . Chronic obstructive pulmonary disease ([place])   . Malignant neoplasm of uterus ([place])   . GERD (gastroesophageal reflux disease) 10/18/2012  . Hernia, hiatal 10/18/2012  . Diabetes mellitus, type 2 ([place]) 04/18/2008  . Hyperlipidemia 04/18/2008  . SMOKER 04/18/2008  . Essential hypertension 04/18/2008   Outpatient Encounter Medications as of 09/10/2017  Medication Sig  . albuterol (PROVENTIL HFA;VENTOLIN HFA) 108 (90 BASE) MCG/ACT inhaler Inhale 2 puffs into the lungs every 6 (six) hours as needed for wheezing or shortness of breath.  Marland Kitchen  aspirin 325 MG tablet Take 325 mg by mouth daily.  Marland Kitchen atorvastatin (LIPITOR) 40 MG tablet TAKE 1 TABLET DAILY  . Cholecalciferol (VITAMIN D3) 5000 units TABS Take 1 tablet by mouth daily.  . clopidogrel (PLAVIX) 75 MG tablet TAKE 1 TABLET DAILY  . escitalopram (LEXAPRO) 20 MG tablet TAKE 1 & 1/2 TABLETS ONCE DAILY  . furosemide (LASIX) 20 MG tablet TAKE 1 TABLET DAILY  . levothyroxine (SYNTHROID, LEVOTHROID) 100 MCG tablet TAKE (1) TABLET DAILY BE- FORE BREAKFAST.  Marland Kitchen losartan (COZAAR) 50 MG tablet TAKE 1 TABLET DAILY  . metFORMIN (GLUCOPHAGE-XR) 500 MG 24 hr tablet TAKE 2 TABLET ONCE DAILY WITH BREAKFAST  . mupirocin ointment (BACTROBAN) 2 % APPLY TO THE NOSE AS DIRECTED 2 TIMES A DAY  . omeprazole (PRILOSEC) 20 MG capsule TAKE (1) CAPSULE DAILY  . ONE TOUCH ULTRA TEST test strip TEST BLOOD SUGARS TWICE A DAY  . Potassium Gluconate 595 MG CAPS Take 1 capsule by mouth daily.  Marland Kitchen tretinoin (RETIN-A) 0.05 % cream Apply topically at bedtime.  Marland Kitchen umeclidinium bromide (INCRUSE ELLIPTA) 62.5 MCG/INH AEPB Inhale 1 puff into the lungs daily.  Marland Kitchen warfarin (COUMADIN) 5 MG tablet TAKE AS DIRECTED UP TO 1 & 1/2 TABLETS A DAY  . [DISCONTINUED] Icosapent Ethyl (VASCEPA) 1 g CAPS Take 2 g by mouth daily.   No facility-administered encounter medications on file as of 09/10/2017.       Review of Systems  Constitutional: Negative.   HENT: Negative.   Eyes: Negative.  Fluid around right eye  Respiratory: Negative.   Cardiovascular: Negative.   Gastrointestinal: Negative.   Endocrine: Negative.   Genitourinary: Negative.   Musculoskeletal: Negative.   Skin: Negative.   Allergic/Immunologic: Negative.   Neurological: Negative.   Hematological: Negative.   Psychiatric/Behavioral: Negative.        Objective:   Physical Exam  Constitutional: She is oriented to person, place, and time. She appears well-developed and well-nourished. No distress.  The patient is pleasant and alert and unfortunately  continues to smoke.  HENT:  Head: Normocephalic and atraumatic.  Right Ear: External ear normal.  Left Ear: External ear normal.  Mouth/Throat: No oropharyngeal exudate.  Bilateral nasal congestion and turbinate swelling  Eyes: Pupils are equal, round, and reactive to light. Conjunctivae and EOM are normal. Right eye exhibits no discharge. Left eye exhibits no discharge. No scleral icterus.  No redness or signs of a stye.  Neck: Normal range of motion. Neck supple. No thyromegaly present.  No bruits thyromegaly or anterior cervical adenopathy  Cardiovascular: Normal rate, regular rhythm and normal heart sounds.  No murmur heard. Heart is regular at 84/min distal pulses were not palpable nor inguinal pulses being palpable.  Pulmonary/Chest: Effort normal and breath sounds normal. No respiratory distress. She has no wheezes. She has no rales.  Clear anteriorly and posteriorly  Abdominal: Soft. Bowel sounds are normal. She exhibits no mass. There is no tenderness. There is no rebound and no guarding.  Abdominal obesity without masses tenderness or organ enlargement or bruits  Musculoskeletal: Normal range of motion. She exhibits no edema.  Lymphadenopathy:    She has no cervical adenopathy.  Neurological: She is alert and oriented to person, place, and time. She has normal reflexes. No cranial nerve deficit.  Skin: Skin is warm and dry. No rash noted.  Small callus on the plantar surface beneath the past location of the fifth toe.  Nondraining.  Patient is aware of this and keeps his checked regularly.  Psychiatric: She has a normal mood and affect. Her behavior is normal. Judgment and thought content normal.  Nursing note and vitals reviewed.   BP 111/65 (BP Location: Left Arm)   Pulse 74   Temp 97.7 F (36.5 C) (Oral)   Ht 5' 10"  (1.778 m)   Wt 231 lb (104.8 kg)   BMI 33.15 kg/m        Assessment & Plan:  1. Type 2 diabetes mellitus with diabetic peripheral angiopathy and  gangrene, without long-term current use of insulin ([place]) -Continue with aggressive t management and current treatment the results of lab work - CBC with Differential/Platelet - Bayer [place] Hb A1c Waived  2. Gastroesophageal reflux disease, esophagitis presence not specified -Continue with omeprazole and diet of avoidance - CBC with Differential/Platelet - Hepatic function panel  3. Pure hypercholesterolemia -Continue with atorvastatin pending results of lab work - CBC with Differential/Platelet - Lipid panel  4. Essential hypertension -The blood pressure is good today and she will continue with current treatment - BMP8+EGFR - CBC with Differential/Platelet - Hepatic function panel  5. Vitamin D deficiency -Continue with vitamin D replacement pending results of lab work - CBC with Differential/Platelet - VITAMIN D 25 Hydroxy (Vit-D Deficiency, Fractures)  6. Chronic obstructive pulmonary disease, unspecified COPD type ([place]) -Make all efforts to stop smoking - CBC with Differential/Platelet  7. Malignant neoplasm of body of uterus, unspecified site [facility]) -Patient gets pelvic exam regularly and has had a pelvic exam sometime recently.  8. Peripheral vascular  insufficiency ([place]) -This is an ongoing problem and she has seen the vascular surgeon in the past and was told not to come back unless she had continued problems and currently she is having no problems with this.  She has been told in the past by the vascular surgeon that there is little that he can do to help this.  Patient Instructions                       Medicare Annual Wellness Visit  [facility] and the medical providers at [facility] strive to bring you the best medical care.  In doing so we not only want to address your current medical conditions and concerns but also to detect new conditions early and prevent illness, disease and health-related problems.    Medicare offers a yearly Wellness  Visit which allows our clinical staff to assess your need for preventative services including immunizations, lifestyle education, counseling to decrease risk of preventable diseases and screening for fall risk and other medical concerns.    This visit is provided free of charge (no copay) for all Medicare recipients. The clinical pharmacists at [facility] have begun to conduct these Wellness Visits which will also include a thorough review of all your medications.    As you primary medical provider recommend that you make an appointment for your Annual Wellness Visit if you have not done so already this year.  You may set up this appointment before you leave today or you may call back ([phone]) and schedule an appointment.  Please make sure when you call that you mention that you are scheduling your Annual Wellness Visit with the clinical pharmacist so that the appointment may be made for the proper length of time.     Continue current medications. Continue good therapeutic lifestyle changes which include good diet and exercise. Fall precautions discussed with patient. If an FOBT was given today- please return it to our front desk. If you are over 17 years old - you may need Prevnar 69 or the adult Pneumonia vaccine.  **Flu shots are available--- please call and schedule a FLU-CLINIC appointment**  After your visit with [place] today you will receive a survey in the mail or online from [employer] regarding your care with [place]. Please take a moment to fill this out. Your feedback is very important to [place] as you can help [place] better understand your patient needs as well as improve your experience and satisfaction. WE CARE ABOUT YOU!!!   Continue with current treatment Continue to make all efforts at stopping cigarette smoking If problems continue with the patient will call back and we will call in a prescription for antibiotics for this. Stay well-hydrated and drink plenty of  fluids Walk as much as possible to improve collateral circulation Because of diabetes keep feet checked regularly  Arrie Senate MD

## 2017-09-11 LAB — LIPID PANEL
CHOL/HDL RATIO: 6.1 ratio — AB (ref 0.0–4.4)
Cholesterol, Total: 182 mg/dL (ref 100–199)
HDL: 30 mg/dL — AB (ref >39)
LDL CALC: 105 mg/dL — AB (ref 0–99)
Triglycerides: 236 mg/dL — ABNORMAL HIGH (ref 0–149)
VLDL Cholesterol Cal: 47 mg/dL — ABNORMAL HIGH (ref 5–40)

## 2017-09-11 LAB — CBC WITH DIFFERENTIAL/PLATELET
BASOS: 0 %
Basophils Absolute: 0 10*3/uL (ref 0.0–0.2)
EOS (ABSOLUTE): 0.2 10*3/uL (ref 0.0–0.4)
EOS: 2 %
HEMATOCRIT: 43.6 % (ref 34.0–46.6)
Hemoglobin: 15 g/dL (ref 11.1–15.9)
IMMATURE GRANULOCYTES: 0 %
Immature Grans (Abs): 0 10*3/uL (ref 0.0–0.1)
LYMPHS ABS: 3.1 10*3/uL (ref 0.7–3.1)
Lymphs: 31 %
MCH: 29.9 pg (ref 26.6–33.0)
MCHC: 34.4 g/dL (ref 31.5–35.7)
MCV: 87 fL (ref 79–97)
MONOS ABS: 0.5 10*3/uL (ref 0.1–0.9)
Monocytes: 5 %
NEUTROS PCT: 62 %
Neutrophils Absolute: 6 10*3/uL (ref 1.4–7.0)
Platelets: 245 10*3/uL (ref 150–379)
RBC: 5.02 x10E6/uL (ref 3.77–5.28)
RDW: 14.6 % (ref 12.3–15.4)
WBC: 9.7 10*3/uL (ref 3.4–10.8)

## 2017-09-11 LAB — BMP8+EGFR
BUN / CREAT RATIO: 14 (ref 9–23)
BUN: 10 mg/dL (ref 6–24)
CO2: 25 mmol/L (ref 20–29)
CREATININE: 0.74 mg/dL (ref 0.57–1.00)
Calcium: 9.6 mg/dL (ref 8.7–10.2)
Chloride: 102 mmol/L (ref 96–106)
GFR calc Af Amer: 105 mL/min/{1.73_m2} (ref >59)
GFR, EST NON AFRICAN AMERICAN: 91 mL/min/{1.73_m2} (ref >59)
Glucose: 161 mg/dL — ABNORMAL HIGH (ref 65–99)
Potassium: 4.7 mmol/L (ref 3.5–5.2)
SODIUM: 142 mmol/L (ref 134–144)

## 2017-09-11 LAB — HEPATIC FUNCTION PANEL
ALT: 17 IU/L (ref 0–32)
AST: 16 IU/L (ref 0–40)
Albumin: 3.7 g/dL (ref 3.5–5.5)
Alkaline Phosphatase: 63 IU/L (ref 39–117)
BILIRUBIN TOTAL: 0.3 mg/dL (ref 0.0–1.2)
BILIRUBIN, DIRECT: 0.1 mg/dL (ref 0.00–0.40)
Total Protein: 6.9 g/dL (ref 6.0–8.5)

## 2017-09-11 LAB — VITAMIN D 25 HYDROXY (VIT D DEFICIENCY, FRACTURES): VIT D 25 HYDROXY: 62.3 ng/mL (ref 30.0–100.0)

## 2017-09-21 ENCOUNTER — Telehealth: Payer: Self-pay | Admitting: Family Medicine

## 2017-09-21 NOTE — Telephone Encounter (Signed)
Discussed lab results and recommendations with patient.

## 2017-11-15 ENCOUNTER — Other Ambulatory Visit: Payer: Self-pay | Admitting: Family Medicine

## 2017-11-24 ENCOUNTER — Other Ambulatory Visit: Payer: Self-pay | Admitting: Nurse Practitioner

## 2017-11-24 ENCOUNTER — Other Ambulatory Visit: Payer: Self-pay | Admitting: Family Medicine

## 2018-01-14 ENCOUNTER — Other Ambulatory Visit: Payer: Self-pay | Admitting: Family Medicine

## 2018-01-26 ENCOUNTER — Ambulatory Visit: Payer: Medicare Other | Admitting: Family Medicine

## 2018-02-10 ENCOUNTER — Other Ambulatory Visit: Payer: Self-pay | Admitting: Family Medicine

## 2018-02-10 ENCOUNTER — Other Ambulatory Visit: Payer: Self-pay | Admitting: Nurse Practitioner

## 2018-02-11 NOTE — Telephone Encounter (Signed)
Last seen 09/10/17  DWM 

## 2018-02-11 NOTE — Telephone Encounter (Signed)
Last seen 4/19  Last Thyroid 2016

## 2018-03-14 ENCOUNTER — Ambulatory Visit (INDEPENDENT_AMBULATORY_CARE_PROVIDER_SITE_OTHER): Payer: Medicare Other | Admitting: Family Medicine

## 2018-03-14 ENCOUNTER — Other Ambulatory Visit: Payer: Self-pay | Admitting: Family Medicine

## 2018-03-14 ENCOUNTER — Encounter: Payer: Self-pay | Admitting: Family Medicine

## 2018-03-14 VITALS — BP 117/70 | HR 81 | Temp 97.5°F | Ht 70.0 in | Wt 233.0 lb

## 2018-03-14 DIAGNOSIS — R059 Cough, unspecified: Secondary | ICD-10-CM

## 2018-03-14 DIAGNOSIS — E559 Vitamin D deficiency, unspecified: Secondary | ICD-10-CM

## 2018-03-14 DIAGNOSIS — E1152 Type 2 diabetes mellitus with diabetic peripheral angiopathy with gangrene: Secondary | ICD-10-CM | POA: Diagnosis not present

## 2018-03-14 DIAGNOSIS — E7800 Pure hypercholesterolemia, unspecified: Secondary | ICD-10-CM | POA: Diagnosis not present

## 2018-03-14 DIAGNOSIS — F339 Major depressive disorder, recurrent, unspecified: Secondary | ICD-10-CM | POA: Diagnosis not present

## 2018-03-14 DIAGNOSIS — K219 Gastro-esophageal reflux disease without esophagitis: Secondary | ICD-10-CM

## 2018-03-14 DIAGNOSIS — J449 Chronic obstructive pulmonary disease, unspecified: Secondary | ICD-10-CM

## 2018-03-14 DIAGNOSIS — I739 Peripheral vascular disease, unspecified: Secondary | ICD-10-CM

## 2018-03-14 DIAGNOSIS — R05 Cough: Secondary | ICD-10-CM

## 2018-03-14 DIAGNOSIS — I10 Essential (primary) hypertension: Secondary | ICD-10-CM

## 2018-03-14 MED ORDER — ALBUTEROL SULFATE HFA 108 (90 BASE) MCG/ACT IN AERS: 2.0000 | INHALATION_SPRAY | Freq: Four times a day (QID) | RESPIRATORY_TRACT | 11 refills | Status: DC | PRN

## 2018-03-14 MED ORDER — METFORMIN HCL ER 500 MG PO TB24: ORAL_TABLET | ORAL | 3 refills | Status: DC

## 2018-03-14 NOTE — Patient Instructions (Addendum)
Continue current medications. Continue good therapeutic lifestyle changes which include good diet and exercise. Fall precautions discussed with patient. If an FOBT was given today- please return it to our front desk. If you are over 55 years old - you may need Prevnar 22 or the adult Pneumonia vaccine.  **Flu shots are available--- please call and schedule a FLU-CLINIC appointment**  After your visit with [place] today you will receive a survey in the mail or online from [employer] regarding your care with [place]. Please take a moment to fill this out. Your feedback is very important to [place] as you can help [place] better understand your patient needs as well as improve your experience and satisfaction. WE CARE ABOUT YOU!!!  Drink plenty of fluids Take Mucinex regularly 1 twice daily with a large glass of water Eat healthy Stop smoking Forms completed for continued disability these will be scanned into the record.

## 2018-03-14 NOTE — Progress Notes (Addendum)
Subjective:    Patient ID: [name], female    DOB: 1960/08/14, 57 y.o.   MRN: [id number]  HPI Pt here for follow up and management of chronic medical problems which includes diabetes and hypertension. She is taking medication regularly.the patient is doing well overall and as usual has no complaints. She has type 2 diabetes with diabetic peripheral angiopathy along with gastroesophageal reflux and peripheral vascular insufficiency including hyperlipidemia hypertension vitamin D deficiency and COPD.the patient usesalbuterol inhaleras currently taking atorvarosemide thyroid replacement metforminand omeprazole. She is also taking Coumadin.  The patient is requesting a refill on metformin okay.  The patient is stable.  She needs refills on her inhalers and will start taking her Mucinex more regularly.  She wants a refill on her albuterol and samples of Incruse if we have any.  She has moved to [facility] and staying in a trailer there.  She came home specifically to get her medications and to see [place].  Today she denies any chest pain or shortness of breath anymore than usual.  She plans to get her eyes examined at [facility].  She was given an FOBT to return.  She is still smoking unfortunately.  She denies having any trouble with heartburn indigestion nausea vomiting diarrhea or blood in the stool and says she is passing her water without problems.  She will return in the office in the morning for fasting blood work.     Patient Active Problem List   Diagnosis Date Noted  . Intermittent claudication ([place]) 05/11/2017  . Deep vein thrombosis (DVT) of both lower extremities ([place]) 01/05/2017  . Peripheral vascular insufficiency ([place]) 09/25/2015  . Hypothyroidism 01/29/2014  . Obesity (BMI 30-39.9) 08/16/2013  . Impetigo 07/28/2013  . Chronic obstructive pulmonary disease ([place])   . Malignant neoplasm of uterus ([place])   . GERD (gastroesophageal reflux disease) 10/18/2012  . Hernia, hiatal  10/18/2012  . Diabetes mellitus, type 2 ([place]) 04/18/2008  . Hyperlipidemia 04/18/2008  . SMOKER 04/18/2008  . Essential hypertension 04/18/2008   Outpatient Encounter Medications as of 03/14/2018  Medication Sig  . albuterol (PROVENTIL HFA;VENTOLIN HFA) 108 (90 BASE) MCG/ACT inhaler Inhale 2 puffs into the lungs every 6 (six) hours as needed for wheezing or shortness of breath.  Marland Kitchen aspirin 325 MG tablet Take 325 mg by mouth daily.  Marland Kitchen atorvastatin (LIPITOR) 40 MG tablet TAKE 1 TABLET DAILY  . Cholecalciferol (VITAMIN D3) 5000 units TABS Take 1 tablet by mouth daily.  . clopidogrel (PLAVIX) 75 MG tablet TAKE 1 TABLET DAILY  . escitalopram (LEXAPRO) 20 MG tablet TAKE 1 & 1/2 TABLETS ONCE DAILY  . escitalopram (LEXAPRO) 20 MG tablet TAKE 1 & 1/2 TABLETS ONCE DAILY  . furosemide (LASIX) 20 MG tablet TAKE 1 TABLET DAILY  . levothyroxine (SYNTHROID, LEVOTHROID) 100 MCG tablet TAKE (1) TABLET DAILY BE- FORE BREAKFAST.  Marland Kitchen levothyroxine (SYNTHROID, LEVOTHROID) 100 MCG tablet TAKE (1) TABLET DAILY BE- FORE BREAKFAST.  Marland Kitchen losartan (COZAAR) 50 MG tablet TAKE 1 TABLET DAILY  . metFORMIN (GLUCOPHAGE-XR) 500 MG 24 hr tablet TAKE 2 TABLET ONCE DAILY WITH BREAKFAST  . mupirocin ointment (BACTROBAN) 2 % APPLY TO THE NOSE AS DIRECTED 2 TIMES A DAY  . omeprazole (PRILOSEC) 20 MG capsule TAKE (1) CAPSULE DAILY  . ONE TOUCH ULTRA TEST test strip TEST BLOOD SUGARS TWICE A DAY  . Potassium Gluconate 595 MG CAPS Take 1 capsule by mouth daily.  Marland Kitchen tretinoin (RETIN-A) 0.05 % cream Apply topically at bedtime.  Marland Kitchen  umeclidinium bromide (INCRUSE ELLIPTA) 62.5 MCG/INH AEPB Inhale 1 puff into the lungs daily.  Marland Kitchen warfarin (COUMADIN) 5 MG tablet TAKE AS DIRECTED UP TO 1 & 1/2 TABLETS A DAY   No facility-administered encounter medications on file as of 03/14/2018.      Review of Systems  Constitutional: Negative.   HENT: Negative.   Eyes: Negative.   Respiratory: Negative.   Cardiovascular: Negative.   Gastrointestinal:  Negative.   Endocrine: Negative.   Genitourinary: Negative.   Musculoskeletal: Negative.   Skin: Negative.   Allergic/Immunologic: Negative.   Neurological: Negative.   Hematological: Negative.   Psychiatric/Behavioral: Negative.        Objective:   Physical Exam  Constitutional: She is oriented to person, place, and time. She appears well-developed and well-nourished. No distress.  The patient is pleasant and alert and in good spirits and unfortunately still smoking.  HENT:  Head: Normocephalic and atraumatic.  Right Ear: External ear normal.  Left Ear: External ear normal.  Nose: Nose normal.  Mouth/Throat: Oropharynx is clear and moist. No oropharyngeal exudate.  Eyes: Pupils are equal, round, and reactive to light. Conjunctivae and EOM are normal. Right eye exhibits no discharge. Left eye exhibits no discharge. No scleral icterus.  Plans to get eye exam at [facility] and will make sure that we get a copy of this report  Neck: Normal range of motion. Neck supple. No thyromegaly present.  No bruits thyromegaly or anterior cervical adenopathy  Cardiovascular: Normal rate, regular rhythm and normal heart sounds.  No murmur heard. Heart has a regular rate and rhythm at 84/min.  The patient has peripheral vascular insufficiency and has had 2 toes amputated from the right foot.  Pulses were difficult to palpate.  In both feet.  Pulmonary/Chest: Effort normal. No respiratory distress. She has wheezes. She has no rales.  Chest congestion with coughing bilaterally but no respiratory distress.  Wheezes present.  Abdominal: Soft. Bowel sounds are normal. She exhibits no mass. There is no tenderness. There is no rebound and no guarding.  No abdominal tenderness masses bruits or organ enlargement  Musculoskeletal: Normal range of motion. She exhibits deformity. She exhibits no edema or tenderness.  Absence of 2 toes right foot secondary to amputation secondary to peripheral vascular  insufficiency.  Lymphadenopathy:    She has no cervical adenopathy.  Neurological: She is alert and oriented to person, place, and time. She has normal reflexes. No cranial nerve deficit.  Reflexes are 1+ and equal bilaterally  Skin: Skin is warm and dry. No rash noted.  Psychiatric: She has a normal mood and affect. Her behavior is normal. Judgment and thought content normal.  Patient's mood affect and behavior are all normal for her.  Nursing note and vitals reviewed.   BP 117/70 (BP Location: Left Arm)   Pulse 81   Temp (!) 97.5 F (36.4 C) (Oral)   Ht 5' 10" (1.778 m)   Wt 233 lb (105.7 kg)   BMI 33.43 kg/m        Assessment & Plan:  1. Type 2 diabetes mellitus with diabetic peripheral angiopathy and gangrene, without long-term current use of insulin (HCC) -Unchanged from previous exam - BMP8+EGFR; Future - CBC with Differential/Platelet; Future - Bayer [place] Hb A1c Waived; Future  2. Gastroesophageal reflux disease, esophagitis presence not specified -Watch diet closely and continue to take omeprazole as needed - CBC with Differential/Platelet; Future  3. Pure hypercholesterolemia -Continue aggressive therapeutic lifestyle changes and atorvastatin pending results of  lab work - CBC with Differential/Platelet; Future - Lipid panel; Future  4. Vitamin D deficiency -Continue vitamin D replacement - CBC with Differential/Platelet; Future - VITAMIN D 25 Hydroxy (Vit-D Deficiency, Fractures); Future  5. Essential hypertension -Blood pressure is good today and she will continue with current treatment - BMP8+EGFR; Future - CBC with Differential/Platelet; Future - Hepatic function panel; Future  6. Peripheral vascular insufficiency (HCC) -Continues to have peripheral vascular insufficiency but extremities look good with no sign of any infection  7. Chronic obstructive pulmonary disease, unspecified COPD type ([place]) -Refill on albuterol and Incruse sample given  8.  Depression, recurrent ([place]) -Continue with Lexapro  9. Cough -Use Mucinex maximum strength, blue and white in color, 1 twice daily with a large glass of water for cough and congestion - albuterol (PROVENTIL HFA;VENTOLIN HFA) 108 (90 Base) MCG/ACT inhaler; Inhale 2 puffs into the lungs every 6 (six) hours as needed for wheezing or shortness of breath.  Dispense: 1 Inhaler; Refill: 11   Patient Instructions  Continue current medications. Continue good therapeutic lifestyle changes which include good diet and exercise. Fall precautions discussed with patient. If an FOBT was given today- please return it to our front desk. If you are over 71 years old - you may need Prevnar 81 or the adult Pneumonia vaccine.  **Flu shots are available--- please call and schedule a FLU-CLINIC appointment**  After your visit with [place] today you will receive a survey in the mail or online from [employer] regarding your care with [place]. Please take a moment to fill this out. Your feedback is very important to [place] as you can help [place] better understand your patient needs as well as improve your experience and satisfaction. WE CARE ABOUT YOU!!!  Drink plenty of fluids Take Mucinex regularly 1 twice daily with a large glass of water Eat healthy Stop smoking Forms completed for continued disability these will be scanned into the record.   Arrie Senate MD

## 2018-03-15 ENCOUNTER — Other Ambulatory Visit: Payer: Medicare Other

## 2018-03-15 DIAGNOSIS — E1152 Type 2 diabetes mellitus with diabetic peripheral angiopathy with gangrene: Secondary | ICD-10-CM

## 2018-03-15 DIAGNOSIS — K219 Gastro-esophageal reflux disease without esophagitis: Secondary | ICD-10-CM

## 2018-03-15 DIAGNOSIS — I10 Essential (primary) hypertension: Secondary | ICD-10-CM | POA: Diagnosis not present

## 2018-03-15 DIAGNOSIS — E559 Vitamin D deficiency, unspecified: Secondary | ICD-10-CM | POA: Diagnosis not present

## 2018-03-15 DIAGNOSIS — E7800 Pure hypercholesterolemia, unspecified: Secondary | ICD-10-CM | POA: Diagnosis not present

## 2018-03-15 LAB — BAYER DCA HB A1C WAIVED: HB A1C: 8.4 % — AB (ref <7.0)

## 2018-03-16 LAB — HEPATIC FUNCTION PANEL
ALT: 15 IU/L (ref 0–32)
AST: 15 IU/L (ref 0–40)
Albumin: 3.6 g/dL (ref 3.5–5.5)
Alkaline Phosphatase: 61 IU/L (ref 39–117)
Bilirubin Total: 0.3 mg/dL (ref 0.0–1.2)
Bilirubin, Direct: 0.11 mg/dL (ref 0.00–0.40)
TOTAL PROTEIN: 6.4 g/dL (ref 6.0–8.5)

## 2018-03-16 LAB — CBC WITH DIFFERENTIAL/PLATELET
BASOS ABS: 0 10*3/uL (ref 0.0–0.2)
Basos: 0 %
EOS (ABSOLUTE): 0.2 10*3/uL (ref 0.0–0.4)
Eos: 2 %
HEMOGLOBIN: 14.5 g/dL (ref 11.1–15.9)
Hematocrit: 42.7 % (ref 34.0–46.6)
Immature Grans (Abs): 0.1 10*3/uL (ref 0.0–0.1)
Immature Granulocytes: 1 %
LYMPHS ABS: 2.8 10*3/uL (ref 0.7–3.1)
Lymphs: 25 %
MCH: 29.4 pg (ref 26.6–33.0)
MCHC: 34 g/dL (ref 31.5–35.7)
MCV: 87 fL (ref 79–97)
MONOCYTES: 5 %
Monocytes Absolute: 0.6 10*3/uL (ref 0.1–0.9)
Neutrophils Absolute: 7.8 10*3/uL — ABNORMAL HIGH (ref 1.4–7.0)
Neutrophils: 67 %
PLATELETS: 227 10*3/uL (ref 150–450)
RBC: 4.93 x10E6/uL (ref 3.77–5.28)
RDW: 14.1 % (ref 12.3–15.4)
WBC: 11.5 10*3/uL — AB (ref 3.4–10.8)

## 2018-03-16 LAB — LIPID PANEL
CHOL/HDL RATIO: 5.7 ratio — AB (ref 0.0–4.4)
Cholesterol, Total: 171 mg/dL (ref 100–199)
HDL: 30 mg/dL — AB (ref >39)
LDL CALC: 107 mg/dL — AB (ref 0–99)
Triglycerides: 170 mg/dL — ABNORMAL HIGH (ref 0–149)
VLDL Cholesterol Cal: 34 mg/dL (ref 5–40)

## 2018-03-16 LAB — BMP8+EGFR
BUN / CREAT RATIO: 14 (ref 9–23)
BUN: 9 mg/dL (ref 6–24)
CO2: 24 mmol/L (ref 20–29)
CREATININE: 0.64 mg/dL (ref 0.57–1.00)
Calcium: 9 mg/dL (ref 8.7–10.2)
Chloride: 105 mmol/L (ref 96–106)
GFR calc Af Amer: 116 mL/min/{1.73_m2} (ref >59)
GFR calc non Af Amer: 101 mL/min/{1.73_m2} (ref >59)
Glucose: 159 mg/dL — ABNORMAL HIGH (ref 65–99)
Potassium: 4 mmol/L (ref 3.5–5.2)
SODIUM: 145 mmol/L — AB (ref 134–144)

## 2018-03-16 LAB — VITAMIN D 25 HYDROXY (VIT D DEFICIENCY, FRACTURES): VIT D 25 HYDROXY: 51.5 ng/mL (ref 30.0–100.0)

## 2018-03-16 MED ORDER — ICOSAPENT ETHYL 1 G PO CAPS: 2.0000 | ORAL_CAPSULE | Freq: Two times a day (BID) | ORAL | 3 refills | Status: DC

## 2018-04-21 ENCOUNTER — Telehealth: Payer: Self-pay | Admitting: *Deleted

## 2018-04-21 NOTE — Telephone Encounter (Signed)
LM for pt to call back - she has not had a Pt/inr since 2017 and needs one ASAP.   We need to set up a time asap for her to have a protime with Laurance Flatten or [place].

## 2018-05-13 ENCOUNTER — Other Ambulatory Visit: Payer: Self-pay | Admitting: Family Medicine

## 2018-05-24 LAB — HM DIABETES EYE EXAM

## 2018-06-07 ENCOUNTER — Other Ambulatory Visit: Payer: Self-pay | Admitting: Family Medicine

## 2018-06-10 ENCOUNTER — Other Ambulatory Visit: Payer: Self-pay | Admitting: Family Medicine

## 2018-06-14 ENCOUNTER — Other Ambulatory Visit: Payer: Self-pay | Admitting: Family Medicine

## 2018-07-01 ENCOUNTER — Other Ambulatory Visit: Payer: Self-pay | Admitting: *Deleted

## 2018-07-04 ENCOUNTER — Ambulatory Visit (INDEPENDENT_AMBULATORY_CARE_PROVIDER_SITE_OTHER): Payer: Medicare Other | Admitting: Family Medicine

## 2018-07-04 ENCOUNTER — Inpatient Hospital Stay (HOSPITAL_COMMUNITY)
Admission: EM | Admit: 2018-07-04 | Discharge: 2018-07-11 | DRG: 617 | Disposition: A | Discharge status: Rehabilitation Facility | Payer: Medicare Other | Attending: Internal Medicine | Admitting: Internal Medicine

## 2018-07-04 ENCOUNTER — Inpatient Hospital Stay (HOSPITAL_COMMUNITY): Payer: Medicare Other

## 2018-07-04 ENCOUNTER — Ambulatory Visit (INDEPENDENT_AMBULATORY_CARE_PROVIDER_SITE_OTHER): Payer: Medicare Other

## 2018-07-04 ENCOUNTER — Encounter: Payer: Self-pay | Admitting: Family Medicine

## 2018-07-04 ENCOUNTER — Encounter (HOSPITAL_COMMUNITY): Payer: Self-pay

## 2018-07-04 ENCOUNTER — Telehealth: Payer: Self-pay | Admitting: Family Medicine

## 2018-07-04 VITALS — BP 148/85 | HR 98 | Temp 97.5°F | Ht 70.0 in | Wt 231.0 lb

## 2018-07-04 DIAGNOSIS — Z716 Tobacco abuse counseling: Secondary | ICD-10-CM

## 2018-07-04 DIAGNOSIS — E11628 Type 2 diabetes mellitus with other skin complications: Secondary | ICD-10-CM | POA: Diagnosis not present

## 2018-07-04 DIAGNOSIS — Z89421 Acquired absence of other right toe(s): Secondary | ICD-10-CM | POA: Diagnosis not present

## 2018-07-04 DIAGNOSIS — A419 Sepsis, unspecified organism: Secondary | ICD-10-CM | POA: Diagnosis not present

## 2018-07-04 DIAGNOSIS — I10 Essential (primary) hypertension: Secondary | ICD-10-CM | POA: Diagnosis present

## 2018-07-04 DIAGNOSIS — Z86718 Personal history of other venous thrombosis and embolism: Secondary | ICD-10-CM | POA: Diagnosis not present

## 2018-07-04 DIAGNOSIS — Z800 Family history of malignant neoplasm of digestive organs: Secondary | ICD-10-CM

## 2018-07-04 DIAGNOSIS — F329 Major depressive disorder, single episode, unspecified: Secondary | ICD-10-CM | POA: Diagnosis not present

## 2018-07-04 DIAGNOSIS — E441 Mild protein-calorie malnutrition: Secondary | ICD-10-CM | POA: Diagnosis not present

## 2018-07-04 DIAGNOSIS — K219 Gastro-esophageal reflux disease without esophagitis: Secondary | ICD-10-CM | POA: Diagnosis not present

## 2018-07-04 DIAGNOSIS — S88111A Complete traumatic amputation at level between knee and ankle, right lower leg, initial encounter: Secondary | ICD-10-CM | POA: Diagnosis not present

## 2018-07-04 DIAGNOSIS — Z7901 Long term (current) use of anticoagulants: Secondary | ICD-10-CM | POA: Diagnosis not present

## 2018-07-04 DIAGNOSIS — Z833 Family history of diabetes mellitus: Secondary | ICD-10-CM

## 2018-07-04 DIAGNOSIS — L97519 Non-pressure chronic ulcer of other part of right foot with unspecified severity: Secondary | ICD-10-CM | POA: Diagnosis not present

## 2018-07-04 DIAGNOSIS — I739 Peripheral vascular disease, unspecified: Secondary | ICD-10-CM | POA: Diagnosis present

## 2018-07-04 DIAGNOSIS — Z8249 Family history of ischemic heart disease and other diseases of the circulatory system: Secondary | ICD-10-CM

## 2018-07-04 DIAGNOSIS — M86171 Other acute osteomyelitis, right ankle and foot: Secondary | ICD-10-CM | POA: Diagnosis not present

## 2018-07-04 DIAGNOSIS — M869 Osteomyelitis, unspecified: Secondary | ICD-10-CM

## 2018-07-04 DIAGNOSIS — J449 Chronic obstructive pulmonary disease, unspecified: Secondary | ICD-10-CM | POA: Diagnosis not present

## 2018-07-04 DIAGNOSIS — E669 Obesity, unspecified: Secondary | ICD-10-CM | POA: Diagnosis present

## 2018-07-04 DIAGNOSIS — Z9119 Patient's noncompliance with other medical treatment and regimen: Secondary | ICD-10-CM | POA: Diagnosis not present

## 2018-07-04 DIAGNOSIS — R21 Rash and other nonspecific skin eruption: Secondary | ICD-10-CM | POA: Diagnosis not present

## 2018-07-04 DIAGNOSIS — D62 Acute posthemorrhagic anemia: Secondary | ICD-10-CM | POA: Diagnosis not present

## 2018-07-04 DIAGNOSIS — L089 Local infection of the skin and subcutaneous tissue, unspecified: Secondary | ICD-10-CM

## 2018-07-04 DIAGNOSIS — E7800 Pure hypercholesterolemia, unspecified: Secondary | ICD-10-CM | POA: Diagnosis not present

## 2018-07-04 DIAGNOSIS — Z6833 Body mass index (BMI) 33.0-33.9, adult: Secondary | ICD-10-CM

## 2018-07-04 DIAGNOSIS — F17210 Nicotine dependence, cigarettes, uncomplicated: Secondary | ICD-10-CM | POA: Diagnosis present

## 2018-07-04 DIAGNOSIS — Z8542 Personal history of malignant neoplasm of other parts of uterus: Secondary | ICD-10-CM | POA: Diagnosis not present

## 2018-07-04 DIAGNOSIS — R791 Abnormal coagulation profile: Secondary | ICD-10-CM | POA: Diagnosis not present

## 2018-07-04 DIAGNOSIS — E785 Hyperlipidemia, unspecified: Secondary | ICD-10-CM | POA: Diagnosis not present

## 2018-07-04 DIAGNOSIS — E1165 Type 2 diabetes mellitus with hyperglycemia: Secondary | ICD-10-CM | POA: Diagnosis present

## 2018-07-04 DIAGNOSIS — M868X8 Other osteomyelitis, other site: Secondary | ICD-10-CM | POA: Diagnosis present

## 2018-07-04 DIAGNOSIS — Z7982 Long term (current) use of aspirin: Secondary | ICD-10-CM

## 2018-07-04 DIAGNOSIS — E1169 Type 2 diabetes mellitus with other specified complication: Principal | ICD-10-CM | POA: Diagnosis present

## 2018-07-04 DIAGNOSIS — S88111D Complete traumatic amputation at level between knee and ankle, right lower leg, subsequent encounter: Secondary | ICD-10-CM | POA: Diagnosis not present

## 2018-07-04 DIAGNOSIS — K449 Diaphragmatic hernia without obstruction or gangrene: Secondary | ICD-10-CM | POA: Diagnosis present

## 2018-07-04 DIAGNOSIS — Z89511 Acquired absence of right leg below knee: Secondary | ICD-10-CM

## 2018-07-04 DIAGNOSIS — M86671 Other chronic osteomyelitis, right ankle and foot: Secondary | ICD-10-CM | POA: Diagnosis not present

## 2018-07-04 DIAGNOSIS — E1152 Type 2 diabetes mellitus with diabetic peripheral angiopathy with gangrene: Secondary | ICD-10-CM | POA: Diagnosis present

## 2018-07-04 DIAGNOSIS — I82403 Acute embolism and thrombosis of unspecified deep veins of lower extremity, bilateral: Secondary | ICD-10-CM | POA: Diagnosis not present

## 2018-07-04 DIAGNOSIS — Z79890 Hormone replacement therapy (postmenopausal): Secondary | ICD-10-CM | POA: Diagnosis not present

## 2018-07-04 DIAGNOSIS — E039 Hypothyroidism, unspecified: Secondary | ICD-10-CM | POA: Diagnosis present

## 2018-07-04 DIAGNOSIS — Z90710 Acquired absence of both cervix and uterus: Secondary | ICD-10-CM | POA: Diagnosis not present

## 2018-07-04 DIAGNOSIS — Z886 Allergy status to analgesic agent status: Secondary | ICD-10-CM

## 2018-07-04 DIAGNOSIS — Z79899 Other long term (current) drug therapy: Secondary | ICD-10-CM

## 2018-07-04 DIAGNOSIS — E1151 Type 2 diabetes mellitus with diabetic peripheral angiopathy without gangrene: Secondary | ICD-10-CM | POA: Diagnosis not present

## 2018-07-04 DIAGNOSIS — Z888 Allergy status to other drugs, medicaments and biological substances status: Secondary | ICD-10-CM

## 2018-07-04 DIAGNOSIS — F17200 Nicotine dependence, unspecified, uncomplicated: Secondary | ICD-10-CM | POA: Diagnosis not present

## 2018-07-04 DIAGNOSIS — Z7902 Long term (current) use of antithrombotics/antiplatelets: Secondary | ICD-10-CM

## 2018-07-04 DIAGNOSIS — I70261 Atherosclerosis of native arteries of extremities with gangrene, right leg: Secondary | ICD-10-CM | POA: Diagnosis not present

## 2018-07-04 DIAGNOSIS — D72823 Leukemoid reaction: Secondary | ICD-10-CM | POA: Diagnosis not present

## 2018-07-04 DIAGNOSIS — M79671 Pain in right foot: Secondary | ICD-10-CM | POA: Diagnosis not present

## 2018-07-04 DIAGNOSIS — Z4781 Encounter for orthopedic aftercare following surgical amputation: Secondary | ICD-10-CM | POA: Diagnosis not present

## 2018-07-04 DIAGNOSIS — I96 Gangrene, not elsewhere classified: Secondary | ICD-10-CM | POA: Diagnosis not present

## 2018-07-04 DIAGNOSIS — D72829 Elevated white blood cell count, unspecified: Secondary | ICD-10-CM | POA: Diagnosis not present

## 2018-07-04 DIAGNOSIS — E1140 Type 2 diabetes mellitus with diabetic neuropathy, unspecified: Secondary | ICD-10-CM | POA: Diagnosis present

## 2018-07-04 DIAGNOSIS — Z7984 Long term (current) use of oral hypoglycemic drugs: Secondary | ICD-10-CM

## 2018-07-04 HISTORY — DX: Osteomyelitis, unspecified: M86.9

## 2018-07-04 LAB — CBC WITH DIFFERENTIAL/PLATELET
Abs Immature Granulocytes: 0.06 10*3/uL (ref 0.00–0.07)
Abs Immature Granulocytes: 0.06 10*3/uL (ref 0.00–0.07)
Basophils Absolute: 0.1 10*3/uL (ref 0.0–0.1)
Basophils Absolute: 0.1 10*3/uL (ref 0.0–0.1)
Basophils Relative: 1 %
Basophils Relative: 0 %
Eosinophils Absolute: 0.2 10*3/uL (ref 0.0–0.5)
Eosinophils Absolute: 0.2 10*3/uL (ref 0.0–0.5)
Eosinophils Relative: 2 %
Eosinophils Relative: 2 %
HCT: 45.7 % (ref 36.0–46.0)
HEMATOCRIT: 48.8 % — AB (ref 36.0–46.0)
Hemoglobin: 15.3 g/dL — ABNORMAL HIGH (ref 12.0–15.0)
Hemoglobin: 14.7 g/dL (ref 12.0–15.0)
Immature Granulocytes: 1 %
Immature Granulocytes: 1 %
LYMPHS ABS: 4.2 10*3/uL — AB (ref 0.7–4.0)
LYMPHS ABS: 4.3 10*3/uL — AB (ref 0.7–4.0)
LYMPHS PCT: 38 %
Lymphocytes Relative: 39 %
MCH: 28.4 pg (ref 26.0–34.0)
MCH: 28.5 pg (ref 26.0–34.0)
MCHC: 31.4 g/dL (ref 30.0–36.0)
MCHC: 32.2 g/dL (ref 30.0–36.0)
MCV: 90.7 fL (ref 80.0–100.0)
MCV: 88.6 fL (ref 80.0–100.0)
MONOS PCT: 8 %
Monocytes Absolute: 0.9 10*3/uL (ref 0.1–1.0)
Monocytes Absolute: 0.9 10*3/uL (ref 0.1–1.0)
Monocytes Relative: 8 %
NEUTROS PCT: 50 %
Neutro Abs: 5.5 10*3/uL (ref 1.7–7.7)
Neutro Abs: 5.7 10*3/uL (ref 1.7–7.7)
Neutrophils Relative %: 50 %
Platelets: 231 10*3/uL (ref 150–400)
Platelets: 236 10*3/uL (ref 150–400)
RBC: 5.38 MIL/uL — ABNORMAL HIGH (ref 3.87–5.11)
RBC: 5.16 MIL/uL — ABNORMAL HIGH (ref 3.87–5.11)
RDW: 13.8 % (ref 11.5–15.5)
RDW: 13.6 % (ref 11.5–15.5)
WBC: 10.9 10*3/uL — ABNORMAL HIGH (ref 4.0–10.5)
WBC: 11.1 10*3/uL — ABNORMAL HIGH (ref 4.0–10.5)
nRBC: 0 % (ref 0.0–0.2)
nRBC: 0 % (ref 0.0–0.2)

## 2018-07-04 LAB — COMPREHENSIVE METABOLIC PANEL
ALT: 22 U/L (ref 0–44)
ALT: 19 U/L (ref 0–44)
AST: 20 U/L (ref 15–41)
AST: 20 U/L (ref 15–41)
Albumin: 3.5 g/dL (ref 3.5–5.0)
Albumin: 3.2 g/dL — ABNORMAL LOW (ref 3.5–5.0)
Alkaline Phosphatase: 57 U/L (ref 38–126)
Alkaline Phosphatase: 48 U/L (ref 38–126)
Anion gap: 15 (ref 5–15)
Anion gap: 11 (ref 5–15)
BILIRUBIN TOTAL: 0.5 mg/dL (ref 0.3–1.2)
BUN: 11 mg/dL (ref 6–20)
BUN: 12 mg/dL (ref 6–20)
CO2: 26 mmol/L (ref 22–32)
CO2: 28 mmol/L (ref 22–32)
Calcium: 9.6 mg/dL (ref 8.9–10.3)
Calcium: 9.3 mg/dL (ref 8.9–10.3)
Chloride: 98 mmol/L (ref 98–111)
Chloride: 98 mmol/L (ref 98–111)
Creatinine, Ser: 0.78 mg/dL (ref 0.44–1.00)
Creatinine, Ser: 0.75 mg/dL (ref 0.44–1.00)
GFR calc Af Amer: >60 mL/min (ref >60)
GFR calc Af Amer: >60 mL/min (ref >60)
GFR calc non Af Amer: >60 mL/min (ref >60)
GFR calc non Af Amer: >60 mL/min (ref >60)
GLUCOSE: 203 mg/dL — AB (ref 70–99)
Glucose, Bld: 157 mg/dL — ABNORMAL HIGH (ref 70–99)
POTASSIUM: 3.8 mmol/L (ref 3.5–5.1)
Potassium: 4.2 mmol/L (ref 3.5–5.1)
Sodium: 139 mmol/L (ref 135–145)
Sodium: 137 mmol/L (ref 135–145)
Total Bilirubin: 0.3 mg/dL (ref 0.3–1.2)
Total Protein: 7.8 g/dL (ref 6.5–8.1)
Total Protein: 7.2 g/dL (ref 6.5–8.1)

## 2018-07-04 LAB — PROTIME-INR
INR: 1.52
INR: 1.54
Prothrombin Time: 18.1 seconds — ABNORMAL HIGH (ref 11.4–15.2)
Prothrombin Time: 18.3 seconds — ABNORMAL HIGH (ref 11.4–15.2)

## 2018-07-04 LAB — GLUCOSE, CAPILLARY: Glucose-Capillary: 199 mg/dL — ABNORMAL HIGH (ref 70–99)

## 2018-07-04 LAB — APTT: APTT: 35 s (ref 24–36)

## 2018-07-04 LAB — PROCALCITONIN: Procalcitonin: <0.1 ng/mL

## 2018-07-04 LAB — LACTIC ACID, PLASMA
Lactic Acid, Venous: 5.7 mmol/L (ref 0.5–1.9)
Lactic Acid, Venous: 1.9 mmol/L (ref 0.5–1.9)

## 2018-07-04 LAB — HEMOGLOBIN A1C
Hgb A1c MFr Bld: 8.8 % — ABNORMAL HIGH (ref 4.8–5.6)
Mean Plasma Glucose: 205.86 mg/dL

## 2018-07-04 MED ORDER — UMECLIDINIUM BROMIDE 62.5 MCG/INH IN AEPB
1.0000 | INHALATION_SPRAY | Freq: Every day | RESPIRATORY_TRACT | Status: DC
  Filled 2018-07-04: qty 7

## 2018-07-04 MED ORDER — PIPERACILLIN-TAZOBACTAM 3.375 G IVPB 30 MIN
3.3750 g | Freq: Once | INTRAVENOUS | Status: AC
  Administered 2018-07-04: 3.375 g via INTRAVENOUS
  Filled 2018-07-04: qty 50

## 2018-07-04 MED ORDER — INSULIN ASPART 100 UNIT/ML ~~LOC~~ SOLN
0.0000 [IU] | Freq: Three times a day (TID) | SUBCUTANEOUS | Status: DC
  Administered 2018-07-05: 3 [IU] via SUBCUTANEOUS
  Administered 2018-07-05 (×2): 7 [IU] via SUBCUTANEOUS

## 2018-07-04 MED ORDER — ESCITALOPRAM OXALATE 20 MG PO TABS
30.0000 mg | ORAL_TABLET | Freq: Every day | ORAL | Status: DC
  Filled 2018-07-04: qty 3
  Filled 2018-07-04 (×2): qty 1
  Filled 2018-07-04: qty 3

## 2018-07-04 MED ORDER — ALBUTEROL SULFATE (2.5 MG/3ML) 0.083% IN NEBU: 3.0000 mL | INHALATION_SOLUTION | Freq: Four times a day (QID) | RESPIRATORY_TRACT | Status: DC | PRN

## 2018-07-04 MED ORDER — ENOXAPARIN SODIUM 40 MG/0.4ML ~~LOC~~ SOLN
40.0000 mg | SUBCUTANEOUS | Status: DC
  Administered 2018-07-04 – 2018-07-07 (×4): 40 mg via SUBCUTANEOUS
  Filled 2018-07-04 (×5): qty 0.4

## 2018-07-04 MED ORDER — ATORVASTATIN CALCIUM 40 MG PO TABS
40.0000 mg | ORAL_TABLET | Freq: Every day | ORAL | Status: DC
  Administered 2018-07-05 – 2018-07-11 (×7): 40 mg via ORAL
  Filled 2018-07-04 (×7): qty 1

## 2018-07-04 MED ORDER — DIPHENHYDRAMINE HCL 25 MG PO CAPS
25.0000 mg | ORAL_CAPSULE | Freq: Four times a day (QID) | ORAL | Status: DC | PRN
  Administered 2018-07-04 – 2018-07-09 (×4): 25 mg via ORAL
  Filled 2018-07-04 (×4): qty 1

## 2018-07-04 MED ORDER — MUPIROCIN 2 % EX OINT
1.0000 "application " | TOPICAL_OINTMENT | Freq: Two times a day (BID) | CUTANEOUS | Status: DC
  Administered 2018-07-05 – 2018-07-09 (×7): 1 via TOPICAL
  Filled 2018-07-04 (×4): qty 22

## 2018-07-04 MED ORDER — TRETINOIN 0.05 % EX CREA: 1.0000 "application " | TOPICAL_CREAM | Freq: Every day | CUTANEOUS | Status: DC

## 2018-07-04 MED ORDER — PANTOPRAZOLE SODIUM 40 MG PO TBEC
40.0000 mg | DELAYED_RELEASE_TABLET | Freq: Every day | ORAL | Status: DC
  Administered 2018-07-05 – 2018-07-11 (×7): 40 mg via ORAL
  Filled 2018-07-04 (×7): qty 1

## 2018-07-04 MED ORDER — INSULIN ASPART 100 UNIT/ML ~~LOC~~ SOLN: 0.0000 [IU] | Freq: Every day | SUBCUTANEOUS | Status: DC

## 2018-07-04 MED ORDER — INSULIN ASPART 100 UNIT/ML ~~LOC~~ SOLN
6.0000 [IU] | Freq: Three times a day (TID) | SUBCUTANEOUS | Status: DC
  Administered 2018-07-05 (×2): 6 [IU] via SUBCUTANEOUS

## 2018-07-04 MED ORDER — LACTATED RINGERS IV SOLN
INTRAVENOUS | Status: DC
  Administered 2018-07-04: 20:00:00 via INTRAVENOUS

## 2018-07-04 MED ORDER — HYDROXYZINE HCL 10 MG PO TABS: 10.0000 mg | ORAL_TABLET | Freq: Three times a day (TID) | ORAL | 0 refills | Status: DC | PRN

## 2018-07-04 MED ORDER — POTASSIUM GLUCONATE 595 MG PO CAPS: 1.0000 | ORAL_CAPSULE | Freq: Every day | ORAL | Status: DC

## 2018-07-04 MED ORDER — ASPIRIN 325 MG PO TABS
325.0000 mg | ORAL_TABLET | Freq: Every day | ORAL | Status: DC
  Administered 2018-07-05 – 2018-07-06 (×2): 325 mg via ORAL
  Filled 2018-07-04 (×2): qty 1

## 2018-07-04 MED ORDER — VANCOMYCIN HCL 10 G IV SOLR
1250.0000 mg | Freq: Two times a day (BID) | INTRAVENOUS | Status: DC
  Administered 2018-07-04: 1250 mg via INTRAVENOUS
  Filled 2018-07-04 (×3): qty 1250

## 2018-07-04 MED ORDER — LOSARTAN POTASSIUM 50 MG PO TABS
50.0000 mg | ORAL_TABLET | Freq: Every day | ORAL | Status: DC
  Administered 2018-07-05 – 2018-07-11 (×7): 50 mg via ORAL
  Filled 2018-07-04 (×7): qty 1

## 2018-07-04 MED ORDER — PIPERACILLIN-TAZOBACTAM 3.375 G IVPB
3.3750 g | Freq: Three times a day (TID) | INTRAVENOUS | Status: DC
  Administered 2018-07-04 – 2018-07-09 (×14): 3.375 g via INTRAVENOUS
  Filled 2018-07-04 (×13): qty 50

## 2018-07-04 MED ORDER — VITAMIN D 25 MCG (1000 UNIT) PO TABS
5000.0000 [IU] | ORAL_TABLET | Freq: Every day | ORAL | Status: DC
  Administered 2018-07-05 – 2018-07-11 (×7): 5000 [IU] via ORAL
  Filled 2018-07-04 (×11): qty 5

## 2018-07-04 MED ORDER — FUROSEMIDE 20 MG PO TABS
20.0000 mg | ORAL_TABLET | Freq: Every day | ORAL | Status: DC
  Administered 2018-07-05 – 2018-07-11 (×7): 20 mg via ORAL
  Filled 2018-07-04 (×7): qty 1

## 2018-07-04 MED ORDER — CLINDAMYCIN HCL 300 MG PO CAPS: 300.0000 mg | ORAL_CAPSULE | Freq: Three times a day (TID) | ORAL | 0 refills | Status: DC

## 2018-07-04 MED ORDER — LEVOTHYROXINE SODIUM 100 MCG PO TABS
100.0000 ug | ORAL_TABLET | Freq: Every day | ORAL | Status: DC
  Administered 2018-07-05 – 2018-07-11 (×7): 100 ug via ORAL
  Filled 2018-07-04 (×7): qty 1

## 2018-07-04 MED ORDER — SODIUM CHLORIDE 0.9 % IV SOLN
600.0000 mg | Freq: Every day | INTRAVENOUS | Status: AC
  Administered 2018-07-04 – 2018-07-08 (×5): 600 mg via INTRAVENOUS
  Filled 2018-07-04 (×5): qty 12

## 2018-07-04 MED ORDER — CLOPIDOGREL BISULFATE 75 MG PO TABS
75.0000 mg | ORAL_TABLET | Freq: Once | ORAL | Status: AC
  Administered 2018-07-05: 75 mg via ORAL
  Filled 2018-07-04: qty 1

## 2018-07-04 MED ORDER — SODIUM CHLORIDE 0.9 % IV BOLUS
1000.0000 mL | Freq: Once | INTRAVENOUS | Status: AC
  Administered 2018-07-04: 1000 mL via INTRAVENOUS

## 2018-07-04 MED ORDER — CIPROFLOXACIN HCL 500 MG PO TABS: 500.0000 mg | ORAL_TABLET | Freq: Two times a day (BID) | ORAL | 0 refills | Status: DC

## 2018-07-04 NOTE — ED Notes (Signed)
Delay in obtaining labs, cultures, and giving IV meds due to lack of IV access.  IV team still working to obtain IV access.

## 2018-07-04 NOTE — H&P (Signed)
History and Physical    [name] WUX:[id number] DOB: 1960-11-13 DOA: 07/04/2018  PCP: Chipper Herb, MD   Patient coming from: [facility], [name]  I have personally briefly reviewed patient's old medical records in [place]  Chief Complaint: Osteomyelitis of right great toe  HPI: [name] is a 58 y.o. female with medical history significant of diabetes type 2, COPD, hypercholesterolemia, hypothyroidism, hypertension, gastroesophageal reflux disease, full vascular disease with multiple surgeries by Dr. Donnetta Hutching and obesity who presents the emergency department today having been sent in by her nurse practitioner due to osteomyelitis of the right great toe.  She has had a chronic wound in this area for many weeks.  She has had no pain in the right great toe and states that she is feeling well today.  She does have peripheral neuropathy due to her diabetes.  She has no fevers, no chills, no nausea or vomiting no cough no abdominal pain or any other concerns today.  She is ambulatory without assistance or distress.  She has had a fourth and fifth toe amputation of that foot in the past.  Sensation decreased in the toe with capillary refill is intact.  Patient continues to enjoy smoking she smokes about a pack a day and has done so for 40 years.   ED Course: X rays reviewed and ordered by our emergency department.  Patient received 1 L normal saline, she also received 3.375 g of Pipracil and tazobactam and 1250 mg of vancomycin.  Laboratory data significant for lactic acid of 5.7, blood glucose of 203,  Review of Systems: As per HPI otherwise all other systems reviewed and  negative.   Past Medical History:  Diagnosis Date  . COPD (chronic obstructive pulmonary disease) ([place])   . Depression   . DM (diabetes mellitus) ([place])   . DVT (deep venous thrombosis) ([facility])    x5  . Family history of colon cancer   . GERD (gastroesophageal  reflux disease)   . Hyperlipemia   . Hypertension   . Hypothyroid   . Obesity   . PVD (peripheral vascular disease) ([place])   . Uterine cancer ([place])   . UTI (urinary tract infection)     Past Surgical History:  Procedure Laterality Date  . ABDOMINAL HYSTERECTOMY    . CESAREAN SECTION    . FEMORAL BYPASS     x 5  . LUMBAR DISC SURGERY     L4-L5  . TOE AMPUTATION     right  . TONSILLECTOMY      Social History   Social History Narrative  . Not on file     reports that she has been smoking cigarettes. She has a 19.00 pack-year smoking history. She has never used smokeless tobacco. She reports that she does not drink alcohol or use drugs.  Allergies  Allergen Reactions  . Aleve [Naproxen Sodium] Hives and Itching  . Cortisone Swelling    Internal organs  . Prednisone Swelling    Makes internal organs swell    Family History  Problem Relation Age of Onset  . Colon cancer Brother 43  . Hypertension Mother   . Hypothyroidism Mother   . Heart disease Father   . Diabetes Father   . Hyperlipidemia Father   . Clotting disorder Brother   . Diabetes Brother   . Heart disease Brother      Prior to Admission medications   Medication Sig Start Date End Date Taking?  Authorizing Provider  albuterol (PROVENTIL HFA;VENTOLIN HFA) 108 (90 Base) MCG/ACT inhaler Inhale 2 puffs into the lungs every 6 (six) hours as needed for wheezing or shortness of breath. 03/14/18  Yes Chipper Herb, MD  aspirin 325 MG tablet Take 325 mg by mouth daily.   Yes [provider]  atorvastatin (LIPITOR) 40 MG tablet TAKE 1 TABLET DAILY Patient taking differently: Take 40 mg by mouth daily.  06/09/18  Yes Chipper Herb, MD  Cholecalciferol (VITAMIN D3) 5000 units TABS Take 1 tablet by mouth daily.   Yes [provider]  clopidogrel (PLAVIX) 75 MG tablet TAKE 1 TABLET DAILY Patient taking differently: Take 75 mg by mouth once.  06/15/18  Yes Chipper Herb, MD  escitalopram (LEXAPRO)  20 MG tablet TAKE 1 & 1/2 TABLETS ONCE DAILY Patient taking differently: 30 mg daily.  02/11/18  Yes Chipper Herb, MD  furosemide (LASIX) 20 MG tablet TAKE 1 TABLET DAILY Patient taking differently: Take 20 mg by mouth daily.  06/15/18  Yes Chipper Herb, MD  levothyroxine (SYNTHROID, LEVOTHROID) 100 MCG tablet TAKE (1) TABLET DAILY BE- FORE BREAKFAST. Patient taking differently: Take 100 mcg by mouth daily.  02/11/18  Yes Chipper Herb, MD  losartan (COZAAR) 50 MG tablet TAKE 1 TABLET DAILY Patient taking differently: Take 50 mg by mouth daily.  06/09/18  Yes Chipper Herb, MD  metFORMIN (GLUCOPHAGE-XR) 500 MG 24 hr tablet TAKE 2 TABLET ONCE DAILY WITH BREAKFAST Patient taking differently: Take 1,000 mg by mouth daily with breakfast.  03/14/18  Yes Chipper Herb, MD  mupirocin ointment (BACTROBAN) 2 % APPLY TO THE NOSE AS DIRECTED 2 TIMES A DAY Patient taking differently: Apply 1 application topically 2 (two) times daily. To nose as directed.   Yes Chipper Herb, MD  omeprazole (PRILOSEC) 20 MG capsule TAKE (1) CAPSULE DAILY Patient taking differently: Take 20 mg by mouth daily.  06/10/18  Yes Chipper Herb, MD  Potassium Gluconate 595 MG CAPS Take 1 capsule by mouth daily.   Yes [provider]  tretinoin (RETIN-A) 0.05 % cream APPLY TO AFFECTED AREAS AT BEDTIME Patient taking differently: Apply 1 application topically at bedtime. To affected areas. 05/16/18  Yes Gottschalk, Ashly M, DO  umeclidinium bromide (INCRUSE ELLIPTA) 62.5 MCG/INH AEPB Inhale 1 puff into the lungs daily. 08/27/16  Yes Chipper Herb, MD  warfarin (COUMADIN) 5 MG tablet TAKE AS DIRECTED UP TO 1 & 1/2 TABLETS A DAY Patient taking differently: Take 5-7.5 mg by mouth See admin instructions. Take as directed up to 7.5mg  daily 06/15/18  Yes Chipper Herb, MD  hydrOXYzine (ATARAX/VISTARIL) 10 MG tablet Take 1 tablet (10 mg total) by mouth 3 (three) times daily as needed. 07/04/18   Baruch Gouty, FNP  ONE TOUCH  ULTRA TEST test strip TEST BLOOD SUGARS TWICE A DAY 03/12/16   Chipper Herb, MD    Physical Exam:  Constitutional: NAD, calm, comfortable Vitals:   07/04/18 1515 07/04/18 1703 07/04/18 1730 07/04/18 1745  BP: (!) 141/85 (!) 151/84 134/75 137/84  Pulse: 92 75 81 83  Resp: (!) 21 18 19 13   Temp:  97.7 F (36.5 C)    TempSrc:  Oral    SpO2: 98% 99% 95% 94%   Eyes: PERRL, lids and conjunctivae normal ENMT: Mucous membranes are moist. Posterior pharynx clear of any exudate or lesions.Normal dentition.  Neck: normal, supple, no masses, no thyromegaly Respiratory: clear to auscultation bilaterally, no wheezing,  no crackles. Normal respiratory effort. No accessory muscle use.  Cardiovascular: Regular rate and rhythm, no murmurs / rubs / gallops. No extremity edema. 2+ pedal pulses. No carotid bruits.  Abdomen: no tenderness, no masses palpated. No hepatosplenomegaly. Bowel sounds positive.  Musculoskeletal: no clubbing / cyanosis. No joint deformity upper and lower extremities. Good ROM, no contractures. Normal muscle tone.  Skin: no rashes, lesions. No induration.  Dry ulceration of fifth great toe please see photo in ED provider note. Neurologic: CN 2-12 grossly intact. Sensation creased in stocking glove distribution, DTR normal. Strength 5/5 in all 4.  Psychiatric: Normal judgment and insight. Alert and oriented x 3. Normal mood.    Labs on Admission: I have personally reviewed following labs and imaging studies  CBC: Recent Labs  Lab 07/04/18 1209 07/04/18 1641  WBC 10.9* 11.1*  NEUTROABS 5.5 5.7  HGB 15.3* 14.7  HCT 48.8* 45.7  MCV 90.7 88.6  PLT 231 062   Basic Metabolic Panel: Recent Labs  Lab 07/04/18 1209  NA 139  K 4.2  CL 98  CO2 26  GLUCOSE 203*  BUN 11  CREATININE 0.78  CALCIUM 9.6   GFR: Estimated Creatinine Clearance: 104.1 mL/min (by C-G formula based on SCr of 0.78 mg/dL). Liver Function Tests: Recent Labs  Lab 07/04/18 1209  AST 20  ALT 22    ALKPHOS 57  BILITOT 0.3  PROT 7.8  ALBUMIN 3.5   No results for input(s): LIPASE, AMYLASE in the last 168 hours. No results for input(s): AMMONIA in the last 168 hours. Coagulation Profile: Recent Labs  Lab 07/04/18 1209  INR 1.52   Cardiac Enzymes: No results for input(s): CKTOTAL, CKMB, CKMBINDEX, TROPONINI in the last 168 hours. BNP (last 3 results) No results for input(s): PROBNP in the last 8760 hours. HbA1C: No results for input(s): HGBA1C in the last 72 hours. CBG: No results for input(s): GLUCAP in the last 168 hours. Lipid Profile: No results for input(s): CHOL, HDL, LDLCALC, TRIG, CHOLHDL, LDLDIRECT in the last 72 hours. Thyroid Function Tests: No results for input(s): TSH, T4TOTAL, FREET4, T3FREE, THYROIDAB in the last 72 hours. Anemia Panel: No results for input(s): VITAMINB12, FOLATE, FERRITIN, TIBC, IRON, RETICCTPCT in the last 72 hours. Urine analysis:    Component Value Date/Time   COLORURINE YELLOW 12/08/2006 0927   APPEARANCEUR Clear 09/25/2015 1558   LABSPEC 1.013 12/08/2006 0927   PHURINE 6.5 12/08/2006 0927   GLUCOSEU Negative 09/25/2015 1558   HGBUR NEGATIVE 12/08/2006 0927   BILIRUBINUR Negative 09/25/2015 1558   KETONESUR NEGATIVE 12/08/2006 0927   PROTEINUR Negative 09/25/2015 1558   PROTEINUR NEGATIVE 12/08/2006 0927   UROBILINOGEN negative 04/24/2015 1454   UROBILINOGEN 0.2 12/08/2006 0927   NITRITE Negative 09/25/2015 1558   NITRITE NEGATIVE 12/08/2006 0927   LEUKOCYTESUR Negative 09/25/2015 1558    Radiological Exams on Admission: Dg Toe Great Right  Result Date: 07/04/2018 CLINICAL DATA:  Diabetic infection of the right foot. EXAM: RIGHT GREAT TOE COMPARISON:  Radiographs dated 09/01/2006 FINDINGS: There is erosion of the dorsal aspect of the tip of the tuft of the distal phalanx of the right great toe as compared to the prior exam. There has been previous amputation of the fourth and fifth toes at the level of the base of the proximal  phalanges. This is unchanged. The other bones of the visualized portion of the forefoot appear normal. IMPRESSION: Focal osteomyelitis of the tip of the tuft of the distal phalanx of the great toe. Electronically Signed  By: Lorriane Shire M.D.   On: 07/04/2018 09:15      Assessment/Plan Principal Problem:   Osteomyelitis of great toe of right foot ([place]) Active Problems:   Diabetic infection of right foot ([facility])   Intermittent claudication ([facility])   Chronic obstructive pulmonary disease ([facility])   Peripheral vascular insufficiency ([facility])   Hyperlipidemia   SMOKER   Essential hypertension   GERD (gastroesophageal reflux disease)   Obesity (BMI 30-39.9)   Hypothyroidism   Deep vein thrombosis (DVT) of both lower extremities ([place])   1.  Osteomyelitis of great toe of right foot: Patient be admitted into the hospital started on vancomycin and Zosyn.  Dr. orthopedics from orthopedic surgery has been consulted.  Suspect patient will require evaluation of arterial flow and possible amputation.  2.  Diabetic infection of right foot: Noted vancomycin and Zosyn are ordered.  3.  Intermittent claudication: This is been chronic and present for years.  Patient states that there is no more surgery she can have per Dr. early.  She last saw him 6 years ago.  4.  Hyperlipidemia: Continue home medication regimen for the statin.  5.  Chronic obstructive pulmonary disease: Currently well compensated we will continue her albuterol and steroid inhalers.  6.  Peripheral vascular insufficiency: Likely because of poor blood flow not sure if antibiotics will reach the area of interest.  7.  Tobacco use continuous: Patient has been told multiple times to discontinue tobacco use per her history.  I encouraged her to stop smoking as she may start to have more more troubles with amputations should she continue to smoke.  She has no prior history of stroke or coronary disease.  I spent 9 minutes discussing tobacco  cessation.  8.  Essential hypertension: Continue home medications which include Cozaar.  9.  Gastroesophageal reflux disease: Continue home omeprazole (Protonix is substituted in our facility).  10.  Obesity: Dietary counseling would be helpful.  11.  Hypothyroidism: Continue Synthroid.  12.  History of deep venous thrombosis of both lower extremities.  Patient is on aspirin and Plavix for her arterial disease.  Currently she has had 5 episodes of DVT.  If she has another one may need to consider lifelong anticoagulation  DVT prophylaxis: Lovenox Code Status: Full code Family Communication: No family present.  Patient retains capacity. Disposition Plan: Home versus skilled rehab at discharge in 3 to 4 days Consults called: Dr. Sharol Given orthopedics Admission status: Inpatient   Lady Deutscher MD [facility] Pager [phone]  If 7PM-7AM, please contact night-coverage [email or web address] Password [facility]  07/04/2018, 6:17 PM

## 2018-07-04 NOTE — ED Notes (Signed)
Lactic 5.7

## 2018-07-04 NOTE — ED Triage Notes (Signed)
Pt endorses right big toe infection for a while. Sent by [facility] after x-ray that revealed osteomyelitis. Pt is not on antibiotics. Has very poor circulation to right foot and already has 4th/5th toes amputated. VSS, Afebrile.

## 2018-07-04 NOTE — Patient Instructions (Signed)
Wound Infection    A wound infection happens when germs start to grow in the wound. Germs that cause wound infections are most often bacteria. Other types of infections can occur as well. In some cases, infection can cause the wound to break open. Wound infections need treatment. If a wound infection is not treated, complications can happen.  Follow these instructions at home:  Medicines   Take or apply over-the-counter and prescription medicines only as told by your doctor.   If you were prescribed antibiotic medicine, take or apply it as told by your doctor. Do not stop using the antibiotic even if your condition improves.  Wound care   Clean the wound each day or as told by your doctor.  ? Wash the wound with mild soap and water.  ? Rinse the wound with water to remove all soap.  ? Pat the wound dry with a clean towel. Do not rub it.   Follow instructions from your doctor about how to take care of your wound. Make sure you:  ? Wash your hands with soap and water before you change your bandage (dressing). If you cannot use soap and water, use hand sanitizer.  ? Change your bandage as told by your doctor.  ? Leave stitches (sutures), skin glue, or skin tape (adhesive) strips in place if your wound has been closed. They may need to stay in place for 2 weeks or longer. If tape strips get loose and curl up, you may trim the loose edges. Do not remove tape strips completely unless your doctor says it is okay. Some wounds are left open to heal on their own.   Check your wound every day for signs of infection. Watch for:  ? More redness, swelling, or pain.  ? More fluid or blood.  ? Warmth.  ? Pus or a bad smell.  General instructions   Keep the bandage dry until your doctor says it can be removed.   Do not take baths, swim, use a hot tub, or do anything that would put your wound underwater until your doctor says it is okay.   Raise (elevate) the injured area above the level of your heart while you are sitting or  lying down.   Do not scratch or pick at the wound.   Keep all follow-up visits as told by your doctor. This is important.  Contact a doctor if:   Medicine does not help your pain.   You have more redness, swelling, or pain in the area of your wound.   You have more fluid or blood coming from your wound.   Your wound feels warm to the touch.   You have pus coming from your wound.   You continue to notice a bad smell coming from your wound or your bandage.   Your wound that was closed breaks open.  Get help right away if:   You have a red streak going away from your wound.   You have a fever.  This information is not intended to replace advice given to you by your health care provider. Make sure you discuss any questions you have with your health care provider.  Document Released: 03/03/2008 Document Revised: 10/31/2015 Document Reviewed: 11/12/2014  Elsevier Interactive Patient Education  2019 [employer]

## 2018-07-04 NOTE — Progress Notes (Signed)
Patient was receiving vancomycin when she developed itching all over.  Vancomycin was less than half infused.  When it was stopped itching resolved.  I have discontinued vancomycin and discussed with pharmacy to start daptomycin.

## 2018-07-04 NOTE — Progress Notes (Signed)
Pt refused chest x ray. Stating her PCP is taking care of that.

## 2018-07-04 NOTE — Progress Notes (Signed)
Pt admitted to unit from ED. Pt AOx4 and independent. Call bell within reach and pt instructed to call when help is needed. VS completed. Will continue to monitor.

## 2018-07-04 NOTE — Progress Notes (Addendum)
.  rxvanc Pharmacy Antibiotic Note  [name] is a 58 y.o. female admitted on 07/04/2018 with cellulitis.  Pharmacy has been consulted for Vancomycin dosing.  Plan: Vancomycin 1250 mg IV Q 12  hrs. Goal AUC 400-550. Expected AUC: 475 SCr used: 0.78 Zosyn 3.375 gm IV q8hr (4 hour infusion) Will monitor renal function, C&S and vanc levels as appropriate.     Temp (24hrs), Avg:97.6 F (36.4 C), Min:97.5 F (36.4 C), Max:97.7 F (36.5 C)  Recent Labs  Lab 07/04/18 1209  WBC 10.9*  CREATININE 0.78  LATICACIDVEN 5.7*    Estimated Creatinine Clearance: 104.1 mL/min (by C-G formula based on SCr of 0.78 mg/dL).    Allergies  Allergen Reactions  . Aleve [Naproxen Sodium] Hives and Itching  . Cortisone Swelling    Internal organs  . Prednisone Swelling    Makes internal organs swell    Antimicrobials this admission: Vanc 1/27 >>  Zosyn 1/27 >>   Thank you for allowing pharmacy to be a part of this patient's care.  Alanda Slim, PharmD, [facility] Clinical Pharmacist Please see AMION for all Pharmacists' Contact Phone Numbers 07/04/2018, 3:32 PM

## 2018-07-04 NOTE — ED Provider Notes (Signed)
[facility] EMERGENCY DEPARTMENT Provider Note   CSN: [id number] Arrival date & time: 07/04/18  1143     History   Chief Complaint Chief Complaint  Patient presents with  . Wound Infection    HPI [name] is a 58 y.o. female history of diabetes, peripheral vascular disease presents today from family medicine for osteomyelitis of the tuft of the distal phalanx of the right great toe.  Patient reports having chronic wound to this area for multiple weeks.  She denies any pain to the right great toe and states that she is feeling well today.  She denies history of fever, chills, nausea/vomiting, cough, abdominal pain or any other concerns today.  She is ambulatory without assistance or difficulty.  Sensation intact to the toe, capillary refill intact to the pad of the toe.   HPI  Past Medical History:  Diagnosis Date  . COPD (chronic obstructive pulmonary disease) ([place])   . Depression   . DM (diabetes mellitus) ([place])   . DVT (deep venous thrombosis) ([facility])    x5  . Family history of colon cancer   . GERD (gastroesophageal reflux disease)   . Hyperlipemia   . Hypertension   . Hypothyroid   . Obesity   . PVD (peripheral vascular disease) ([place])   . Uterine cancer ([place])   . UTI (urinary tract infection)     Patient Active Problem List   Diagnosis Date Noted  . Osteomyelitis of great toe of right foot ([place]) 07/04/2018  . Intermittent claudication ([place]) 05/11/2017  . Deep vein thrombosis (DVT) of both lower extremities ([place]) 01/05/2017  . Peripheral vascular insufficiency ([place]) 09/25/2015  . Hypothyroidism 01/29/2014  . Obesity (BMI 30-39.9) 08/16/2013  . Impetigo 07/28/2013  . Chronic obstructive pulmonary disease ([place])   . Malignant neoplasm of uterus ([place])   . GERD (gastroesophageal reflux disease) 10/18/2012  . Hernia, hiatal 10/18/2012  . Diabetic infection of right foot ([place]) 04/18/2008  . Hyperlipidemia 04/18/2008  . SMOKER 04/18/2008    . Essential hypertension 04/18/2008    Past Surgical History:  Procedure Laterality Date  . ABDOMINAL HYSTERECTOMY    . CESAREAN SECTION    . FEMORAL BYPASS     x 5  . LUMBAR DISC SURGERY     L4-L5  . TOE AMPUTATION     right  . TONSILLECTOMY       OB History   No obstetric history on file.      Home Medications    Prior to Admission medications   Medication Sig Start Date End Date Taking? Authorizing Provider  albuterol (PROVENTIL HFA;VENTOLIN HFA) 108 (90 Base) MCG/ACT inhaler Inhale 2 puffs into the lungs every 6 (six) hours as needed for wheezing or shortness of breath. 03/14/18  Yes Chipper Herb, MD  aspirin 325 MG tablet Take 325 mg by mouth daily.   Yes [provider]  atorvastatin (LIPITOR) 40 MG tablet TAKE 1 TABLET DAILY Patient taking differently: Take 40 mg by mouth daily.  06/09/18  Yes Chipper Herb, MD  Cholecalciferol (VITAMIN D3) 5000 units TABS Take 1 tablet by mouth daily.   Yes [provider]  clopidogrel (PLAVIX) 75 MG tablet TAKE 1 TABLET DAILY Patient taking differently: Take 75 mg by mouth once.  06/15/18  Yes Chipper Herb, MD  escitalopram (LEXAPRO) 20 MG tablet TAKE 1 & 1/2 TABLETS ONCE DAILY Patient taking differently: 30 mg daily.  02/11/18  Yes Chipper Herb, MD  furosemide (LASIX)  20 MG tablet TAKE 1 TABLET DAILY Patient taking differently: Take 20 mg by mouth daily.  06/15/18  Yes Chipper Herb, MD  levothyroxine (SYNTHROID, LEVOTHROID) 100 MCG tablet TAKE (1) TABLET DAILY BE- FORE BREAKFAST. Patient taking differently: Take 100 mcg by mouth daily.  02/11/18  Yes Chipper Herb, MD  losartan (COZAAR) 50 MG tablet TAKE 1 TABLET DAILY Patient taking differently: Take 50 mg by mouth daily.  06/09/18  Yes Chipper Herb, MD  metFORMIN (GLUCOPHAGE-XR) 500 MG 24 hr tablet TAKE 2 TABLET ONCE DAILY WITH BREAKFAST Patient taking differently: Take 1,000 mg by mouth daily with breakfast.  03/14/18  Yes Chipper Herb, MD  mupirocin  ointment (BACTROBAN) 2 % APPLY TO THE NOSE AS DIRECTED 2 TIMES A DAY Patient taking differently: Apply 1 application topically 2 (two) times daily. To nose as directed.   Yes Chipper Herb, MD  omeprazole (PRILOSEC) 20 MG capsule TAKE (1) CAPSULE DAILY Patient taking differently: Take 20 mg by mouth daily.  06/10/18  Yes Chipper Herb, MD  Potassium Gluconate 595 MG CAPS Take 1 capsule by mouth daily.   Yes [provider]  tretinoin (RETIN-A) 0.05 % cream APPLY TO AFFECTED AREAS AT BEDTIME Patient taking differently: Apply 1 application topically at bedtime. To affected areas. 05/16/18  Yes Gottschalk, Ashly M, DO  umeclidinium bromide (INCRUSE ELLIPTA) 62.5 MCG/INH AEPB Inhale 1 puff into the lungs daily. 08/27/16  Yes Chipper Herb, MD  warfarin (COUMADIN) 5 MG tablet TAKE AS DIRECTED UP TO 1 & 1/2 TABLETS A DAY Patient taking differently: Take 5-7.5 mg by mouth See admin instructions. Take as directed up to 7.5mg  daily 06/15/18  Yes Chipper Herb, MD  hydrOXYzine (ATARAX/VISTARIL) 10 MG tablet Take 1 tablet (10 mg total) by mouth 3 (three) times daily as needed. 07/04/18   Baruch Gouty, FNP  ONE TOUCH ULTRA TEST test strip TEST BLOOD SUGARS TWICE A DAY 03/12/16   Chipper Herb, MD    Family History Family History  Problem Relation Age of Onset  . Colon cancer Brother 61  . Hypertension Mother   . Hypothyroidism Mother   . Heart disease Father   . Diabetes Father   . Hyperlipidemia Father   . Clotting disorder Brother   . Diabetes Brother   . Heart disease Brother     Social History Social History   Tobacco Use  . Smoking status: Current Every Day Smoker    Packs/day: 0.50    Years: 38.00    Pack years: 19.00    Types: Cigarettes  . Smokeless tobacco: Never Used  Substance Use Topics  . Alcohol use: No    Alcohol/week: 0.0 standard drinks  . Drug use: No     Allergies   Aleve [naproxen sodium]; Cortisone; and Prednisone   Review of Systems Review of  Systems  Constitutional: Negative.  Negative for chills, fatigue and fever.  Respiratory: Negative.  Negative for cough and shortness of breath.   Cardiovascular: Negative.  Negative for chest pain.  Gastrointestinal: Negative.  Negative for abdominal pain, diarrhea, nausea and vomiting.  Musculoskeletal: Negative.  Negative for arthralgias and myalgias.  Skin: Positive for wound (Right Great Toe).  Neurological: Negative.  Negative for dizziness, weakness and headaches.  All other systems reviewed and are negative.  Physical Exam Updated Vital Signs BP 137/84   Pulse 83   Temp 97.7 F (36.5 C) (Oral)   Resp 13   SpO2 94%  Physical Exam Constitutional:      General: She is not in acute distress.    Appearance: She is well-developed. She is obese. She is not ill-appearing or diaphoretic.  HENT:     Head: Normocephalic and atraumatic.     Right Ear: External ear normal.     Left Ear: External ear normal.     Nose: Nose normal.  Eyes:     Pupils: Pupils are equal, round, and reactive to light.  Neck:     Musculoskeletal: Normal range of motion.     Trachea: Trachea normal. No tracheal deviation.  Cardiovascular:     Rate and Rhythm: Normal rate and regular rhythm.     Pulses:          Dorsalis pedis pulses are detected w/ Doppler on the right side and detected w/ Doppler on the left side.  Pulmonary:     Effort: Pulmonary effort is normal. No respiratory distress.  Abdominal:     Palpations: Abdomen is soft.     Tenderness: There is no abdominal tenderness. There is no guarding or rebound.  Musculoskeletal: Normal range of motion.        General: No tenderness.     Right lower leg: No edema.     Left lower leg: No edema.  Feet:     Right foot:     Protective Sensation: 3 sites tested. 3 sites sensed.     Left foot:     Protective Sensation: 3 sites tested. 3 sites sensed.  Skin:    General: Skin is warm and dry.     Capillary Refill: Capillary refill takes less than  2 seconds.  Neurological:     Mental Status: She is alert.     GCS: GCS eye subscore is 4. GCS verbal subscore is 5. GCS motor subscore is 6.     Comments: Speech is clear and goal oriented, follows commands Major Cranial nerves without deficit, no facial droop Normal strength in upper and lower extremities bilaterally including dorsiflexion and plantar flexion, strong and equal grip strength Sensation normal to light touch Moves extremities without ataxia, coordination intact Normal gait  Psychiatric:        Behavior: Behavior normal.          ED Treatments / Results  Labs (all labs ordered are listed, but only abnormal results are displayed) Labs Reviewed  COMPREHENSIVE METABOLIC PANEL - Abnormal; Notable for the following components:      Result Value   Glucose, Bld 203 (*)    All other components within normal limits  LACTIC ACID, PLASMA - Abnormal; Notable for the following components:   Lactic Acid, Venous 5.7 (*)    All other components within normal limits  CBC WITH DIFFERENTIAL/PLATELET - Abnormal; Notable for the following components:   WBC 10.9 (*)    RBC 5.38 (*)    Hemoglobin 15.3 (*)    HCT 48.8 (*)    Lymphs Abs 4.2 (*)    All other components within normal limits  PROTIME-INR - Abnormal; Notable for the following components:   Prothrombin Time 18.1 (*)    All other components within normal limits  CBC WITH DIFFERENTIAL/PLATELET - Abnormal; Notable for the following components:   WBC 11.1 (*)    RBC 5.16 (*)    Lymphs Abs 4.3 (*)    All other components within normal limits  PROTIME-INR - Abnormal; Notable for the following components:   Prothrombin Time 18.3 (*)    All  other components within normal limits  HEMOGLOBIN A1C - Abnormal; Notable for the following components:   Hgb A1c MFr Bld 8.8 (*)    All other components within normal limits  CULTURE, BLOOD (ROUTINE X 2)  CULTURE, BLOOD (ROUTINE X 2)  CULTURE, BLOOD (ROUTINE X 2)  CULTURE, BLOOD  (ROUTINE X 2)  LACTIC ACID, PLASMA  APTT  LACTIC ACID, PLASMA  LACTIC ACID, PLASMA  COMPREHENSIVE METABOLIC PANEL  LACTIC ACID, PLASMA  LACTIC ACID, PLASMA  PROCALCITONIN  CBC  CREATININE, SERUM    EKG None  Radiology Dg Toe Great Right  Result Date: 07/04/2018 CLINICAL DATA:  Diabetic infection of the right foot. EXAM: RIGHT GREAT TOE COMPARISON:  Radiographs dated 09/01/2006 FINDINGS: There is erosion of the dorsal aspect of the tip of the tuft of the distal phalanx of the right great toe as compared to the prior exam. There has been previous amputation of the fourth and fifth toes at the level of the base of the proximal phalanges. This is unchanged. The other bones of the visualized portion of the forefoot appear normal. IMPRESSION: Focal osteomyelitis of the tip of the tuft of the distal phalanx of the great toe. Electronically Signed   By: Lorriane Shire M.D.   On: 07/04/2018 09:15    Procedures Procedures (including critical care time)  Medications Ordered in ED Medications  vancomycin (VANCOCIN) 1,250 mg in sodium chloride 0.9 % 250 mL IVPB (1,250 mg Intravenous New Bag/Given 07/04/18 1817)  aspirin tablet 325 mg (has no administration in time range)  atorvastatin (LIPITOR) tablet 40 mg (has no administration in time range)  furosemide (LASIX) tablet 20 mg (has no administration in time range)  losartan (COZAAR) tablet 50 mg (has no administration in time range)  escitalopram (LEXAPRO) tablet 30 mg (has no administration in time range)  levothyroxine (SYNTHROID, LEVOTHROID) tablet 100 mcg (has no administration in time range)  pantoprazole (PROTONIX) EC tablet 40 mg (has no administration in time range)  clopidogrel (PLAVIX) tablet 75 mg (has no administration in time range)  Vitamin D3 TABS 5,000 Units (has no administration in time range)  Potassium Gluconate CAPS 595 mg (has no administration in time range)  albuterol (PROVENTIL HFA;VENTOLIN HFA) 108 (90 Base) MCG/ACT  inhaler 2 puff (has no administration in time range)  umeclidinium bromide (INCRUSE ELLIPTA) 62.5 MCG/INH 1 puff (has no administration in time range)  mupirocin ointment (BACTROBAN) 2 % 1 application (has no administration in time range)  tretinoin (RETIN-A) 7.34 % cream 1 application (has no administration in time range)  insulin aspart (novoLOG) injection 0-20 Units (has no administration in time range)  insulin aspart (novoLOG) injection 0-5 Units (has no administration in time range)  insulin aspart (novoLOG) injection 6 Units (has no administration in time range)  lactated ringers infusion (has no administration in time range)  piperacillin-tazobactam (ZOSYN) IVPB 3.375 g (has no administration in time range)  enoxaparin (LOVENOX) injection 40 mg (has no administration in time range)  sodium chloride 0.9 % bolus 1,000 mL (1,000 mLs Intravenous New Bag/Given 07/04/18 1731)  piperacillin-tazobactam (ZOSYN) IVPB 3.375 g (3.375 g Intravenous New Bag/Given 07/04/18 1731)    Initial Impression / Assessment and Plan / ED Course  I have reviewed the triage vital signs and the nursing notes.  Pertinent labs & imaging results that were available during my care of the patient were reviewed by me and considered in my medical decision making (see chart for details).  Clinical Course as of Jul 04 1834  Mon Jul 04, 2018  1424 Patient not in room.   [BM]  1440 Patient not in room.   [BM]  1523 ABI admit to hospitial   [BM]    Clinical Course User Index [BM] Deliah Boston, PA-C   58 year old female with history of diabetes and peripheral vascular disease presenting today for concern of osteomyelitis of the right great toe.  Patient with wound present for the past few weeks, x-ray performed at primary care office today showing early osteomyelitis.  Patient then referred to emergency department.  Lactic 5.7 PT/INR with elevated PT CBC with leukocytosis CMP nonacute Blood cultures  pending ------------ Initial evaluation patient very well-appearing in no acute distress.  Sensation and movement intact, toe without pain.  Patient without other complaint.  Fluids, IV Abx and repeat lactic acid ordered.  Nursing staff attempting IV.  Received phone call from vascular nurse who states that Dr. Sharol Given is aware of patient and asked that we admit to hospitalist service and start IV antibiotics and Dr. Sharol Given to see patient in hospital.  Informed the Dr. Sharol Given wishes for ankle-brachial indexes to be performed, these have been ordered through nursing communication, unable to find order in epic. ------------ Discussed case with hospitalist who is seeing patient in ED for admission. -------------- Repeat lactic acid is 1.9. ------------- Patient has been admitted to hospitalist service for further treatment evaluation.  Note: Portions of this report may have been transcribed using voice recognition software. Every effort was made to ensure accuracy; however, inadvertent computerized transcription errors may still be present. Final Clinical Impressions(s) / ED Diagnoses   Final diagnoses:  Osteomyelitis of right foot, unspecified type [facility])    ED Discharge Orders    None       Gari Crown 07/04/18 1843    Virgel Manifold, MD 07/04/18 2054

## 2018-07-04 NOTE — Progress Notes (Signed)
Subjective:    Patient ID: [name], female    DOB: December 14, 1960, 58 y.o.   MRN: [id number]  Chief Complaint:  Possible infection in big toe right feet and Itchy rash on neck and chest   HPI: [name] is a 58 y.o. female presenting on 07/04/2018 for Possible infection in big toe right feet and Itchy rash on neck and chest  Pt presents today with complaints of right great toe infection. She states she had a hard callus on her toe about 2 weeks ago. She states she pulled the callus off and then the redness, swelling, and drainage started. She denies pain or fever. Her right fourth and fifth toes have been amputated. Pt states her blood sugars have been elevated over the last few weeks. She also reports a pruritic rash to her upper chest. States this is intermittent. She denies new medications, household products, hygiene products, or new pets. She denies shortness of breath, cough, trouble speaking, trouble swallowing, or throat swelling. She denies other areas of involvement. She has used cool cloths to minimize the pruritis.   Relevant past medical, surgical, family, and social history reviewed and updated as indicated.  Allergies and medications reviewed and updated.   Past Medical History:  Diagnosis Date  . COPD (chronic obstructive pulmonary disease) ([place])   . Depression   . DM (diabetes mellitus) ([place])   . DVT (deep venous thrombosis) ([facility])    x5  . Family history of colon cancer   . GERD (gastroesophageal reflux disease)   . Hyperlipemia   . Hypertension   . Hypothyroid   . Obesity   . PVD (peripheral vascular disease) ([place])   . Uterine cancer ([place])   . UTI (urinary tract infection)     Past Surgical History:  Procedure Laterality Date  . ABDOMINAL HYSTERECTOMY    . CESAREAN SECTION    . FEMORAL BYPASS     x 5  . LUMBAR DISC SURGERY     L4-L5  . TOE AMPUTATION     right  . TONSILLECTOMY      Social History   Socioeconomic History    . Marital status: Married    Spouse name: Not on file  . Number of children: 1  . Years of education: Not on file  . Highest education level: Not on file  Occupational History  . Occupation: Product manager: [employer]  . Financial resource strain: Not on file  . Food insecurity:    Worry: Not on file    Inability: Not on file  . Transportation needs:    Medical: Not on file    Non-medical: Not on file  Tobacco Use  . Smoking status: Current Every Day Smoker    Packs/day: 0.50    Years: 38.00    Pack years: 19.00    Types: Cigarettes  . Smokeless tobacco: Never Used  Substance and Sexual Activity  . Alcohol use: No    Alcohol/week: 0.0 standard drinks  . Drug use: No  . Sexual activity: Not on file  Lifestyle  . Physical activity:    Days per week: Not on file    Minutes per session: Not on file  . Stress: Not on file  Relationships  . Social connections:    Talks on phone: Not on file    Gets together: Not on file    Attends religious service: Not on file    Active member  of club or organization: Not on file    Attends meetings of clubs or organizations: Not on file    Relationship status: Not on file  . Intimate partner violence:    Fear of current or ex partner: Not on file    Emotionally abused: Not on file    Physically abused: Not on file    Forced sexual activity: Not on file  Other Topics Concern  . Not on file  Social History Narrative  . Not on file    Outpatient Encounter Medications as of 07/04/2018  Medication Sig  . albuterol (PROVENTIL HFA;VENTOLIN HFA) 108 (90 Base) MCG/ACT inhaler Inhale 2 puffs into the lungs every 6 (six) hours as needed for wheezing or shortness of breath.  Marland Kitchen aspirin 325 MG tablet Take 325 mg by mouth daily.  Marland Kitchen atorvastatin (LIPITOR) 40 MG tablet TAKE 1 TABLET DAILY  . Cholecalciferol (VITAMIN D3) 5000 units TABS Take 1 tablet by mouth daily.  . clopidogrel (PLAVIX) 75 MG tablet TAKE 1 TABLET  DAILY  . escitalopram (LEXAPRO) 20 MG tablet TAKE 1 & 1/2 TABLETS ONCE DAILY  . furosemide (LASIX) 20 MG tablet TAKE 1 TABLET DAILY  . Icosapent Ethyl (VASCEPA) 1 g CAPS Take 2 capsules (2 g total) by mouth 2 (two) times daily.  Marland Kitchen levothyroxine (SYNTHROID, LEVOTHROID) 100 MCG tablet TAKE (1) TABLET DAILY BE- FORE BREAKFAST.  Marland Kitchen losartan (COZAAR) 50 MG tablet TAKE 1 TABLET DAILY  . metFORMIN (GLUCOPHAGE-XR) 500 MG 24 hr tablet TAKE 2 TABLET ONCE DAILY WITH BREAKFAST  . mupirocin ointment (BACTROBAN) 2 % APPLY TO THE NOSE AS DIRECTED 2 TIMES A DAY  . omeprazole (PRILOSEC) 20 MG capsule TAKE (1) CAPSULE DAILY  . ONE TOUCH ULTRA TEST test strip TEST BLOOD SUGARS TWICE A DAY  . Potassium Gluconate 595 MG CAPS Take 1 capsule by mouth daily.  Marland Kitchen tretinoin (RETIN-A) 0.05 % cream APPLY TO AFFECTED AREAS AT BEDTIME  . umeclidinium bromide (INCRUSE ELLIPTA) 62.5 MCG/INH AEPB Inhale 1 puff into the lungs daily.  Marland Kitchen warfarin (COUMADIN) 5 MG tablet TAKE AS DIRECTED UP TO 1 & 1/2 TABLETS A DAY  . hydrOXYzine (ATARAX/VISTARIL) 10 MG tablet Take 1 tablet (10 mg total) by mouth 3 (three) times daily as needed.  . [DISCONTINUED] ciprofloxacin (CIPRO) 500 MG tablet Take 1 tablet (500 mg total) by mouth 2 (two) times daily for 10 days.  . [DISCONTINUED] clindamycin (CLEOCIN) 300 MG capsule Take 1 capsule (300 mg total) by mouth 3 (three) times daily for 10 days.   No facility-administered encounter medications on file as of 07/04/2018.     Allergies  Allergen Reactions  . Cortisone   . Prednisone     Makes internal organs swell    Review of Systems  Constitutional: Negative for chills, fatigue and fever.  HENT: Negative for trouble swallowing and voice change.   Respiratory: Negative for cough, choking, chest tightness, shortness of breath and wheezing.   Cardiovascular: Negative for chest pain, palpitations and leg swelling.  Endocrine:       Elevated blood sugars  Skin: Positive for rash (pruritic rash  to upper chest) and wound (right great toe, erythema, swelling, and drainage).  Neurological: Negative for weakness and numbness.  Psychiatric/Behavioral: Negative for confusion.  All other systems reviewed and are negative.       Objective:    BP (!) 148/85   Pulse 98   Temp (!) 97.5 F (36.4 C) (Oral)   Ht 5\' 10"  (1.778 m)  Wt 231 lb (104.8 kg)   BMI 33.15 kg/m    Wt Readings from Last 3 Encounters:  07/04/18 231 lb (104.8 kg)  03/14/18 233 lb (105.7 kg)  09/10/17 231 lb (104.8 kg)    Physical Exam Vitals signs and nursing note reviewed.  Constitutional:      Appearance: She is obese.  HENT:     Head: Normocephalic and atraumatic.     Mouth/Throat:     Mouth: Mucous membranes are moist.     Pharynx: Oropharynx is clear.  Eyes:     Conjunctiva/sclera: Conjunctivae normal.     Pupils: Pupils are equal, round, and reactive to light.  Cardiovascular:     Rate and Rhythm: Normal rate and regular rhythm.     Pulses: Normal pulses.     Heart sounds: Normal heart sounds.  Pulmonary:     Effort: Pulmonary effort is normal.     Breath sounds: Normal breath sounds.  Feet:     Right foot:     Skin integrity: Skin breakdown, erythema, warmth, callus and dry skin present.     Comments: Pictures depicting wound to right great toe. Erythema, swelling, and purulent drainage.  Skin:    General: Skin is warm and dry.     Capillary Refill: Capillary refill takes less than 2 seconds.       Neurological:     Mental Status: She is alert and oriented to person, place, and time.  Psychiatric:        Mood and Affect: Mood normal.        Behavior: Behavior normal. Behavior is cooperative.        Thought Content: Thought content normal.        Judgment: Judgment normal.           Results for orders placed or performed in visit on 06/10/18  HM DIABETES EYE EXAM  Result Value Ref Range   HM Diabetic Eye Exam No Retinopathy No Retinopathy     X-Ray: right great toe:  concerning for osteomyelitis of distal phalanx, erosion noted. Preliminary x-ray reading by Monia Pouch, FNP-C, [facility].  Radiology reading: IMPRESSION: Focal osteomyelitis of the tip of the tuft of the distal phalanx of the great toe.   Electronically Signed   By: Lorriane Shire M.D.   On: 07/04/2018 09:15  Pertinent labs & imaging results that were available during my care of the patient were reviewed by me and considered in my medical decision making.  Assessment & Plan:  Yamilex was seen today for possible infection in big toe right feet and itchy rash on neck and chest.  Diagnoses and all orders for this visit:  Diabetic infection of right foot ([place]) -     AMB referral to wound care center -     DG Toe Great Right; Future  Osteomyelitis of great toe of right foot ([place]) Osteomyelitis noted on xray of right great toe. Pt to go to [facility] ED for further treatment. Pt aware of severity and need for further evaluation and treatment.   Rash -     hydrOXYzine (ATARAX/VISTARIL) 10 MG tablet; Take 1 tablet (10 mg total) by mouth 3 (three) times daily as needed.   Pt going to [facility] ED for further treatment of osteomyelitis. Spoke with Lennette Bihari in the ED at [facility]. He was made aware of pt status and ETA. Pt is going POV  Continue all other maintenance medications.  Follow up plan: Return in about 1  week (around 07/11/2018), or if symptoms worsen or fail to improve.   The above assessment and management plan was discussed with the patient. The patient verbalized understanding of and has agreed to the management plan. Patient is aware to call the clinic if symptoms persist or worsen. Patient is aware when to return to the clinic for a follow-up visit. Patient educated on when it is appropriate to go to the emergency department.   Monia Pouch, FNP-C [facility] [phone]

## 2018-07-04 NOTE — Progress Notes (Addendum)
Pt had previous reaction on vancomycin, developed itching on head and neck and rash on neck. Itching improved, but rash and redness on her neck still present. Pt c/o mild itching in that area, MD notified.

## 2018-07-04 NOTE — Progress Notes (Signed)
Pharmacy Antibiotic Note  [name] is a 58 y.o. female admitted today with osteomyelitis of right great toe.  Zosyn and Vancomycin begun.  Spoke with Dr. Evangeline Gula regarding alternative to Vancomycin, due to itching all over during Vancomycin infusion, which resolved after infusion stopped.  To begin Daptomycin.  LFTs normal.  Plan:  Daptomycin 600 mg (~6 mg/kg) IV q24hrs.  CK in am and weekly while on Daptomycin.  May need to hold Atorvastatin while on Daptomycin.  To continue Zosyn 3.375 gm IV q8hrs (each over 4 hrs)  Follow up orthopedics evaluation and plans.    Height: 5\' 10"  (177.8 cm) Weight: 231 lb 0.7 oz (104.8 kg) IBW/kg (Calculated) : 68.5  Temp (24hrs), Avg:97.6 F (36.4 C), Min:97.5 F (36.4 C), Max:97.7 F (36.5 C)  Recent Labs  Lab 07/04/18 1209 07/04/18 1641 07/04/18 1701  WBC 10.9* 11.1*  --   CREATININE 0.78 0.75  --   LATICACIDVEN 5.7*  --  1.9    Estimated Creatinine Clearance: 104.1 mL/min (by C-G formula based on SCr of 0.75 mg/dL).    Allergies  Allergen Reactions  . Aleve [Naproxen Sodium] Hives and Itching  . Cortisone Swelling    Internal organs  . Prednisone Swelling    Makes internal organs swell  . Vancomycin Itching    Antimicrobials this admission:  Vancomycin x 1 on 1/27 (partial dose, as developed itching and infusion stopped)  Zosyn 1/27>>  Daptomycin 1/27>>  Dose adjustments this admission:  n/a  Microbiology results:  1/27 blood x 2 -   Thank you for allowing pharmacy to be a part of this patient's care.  Arty Baumgartner, [place] Pager: [phone] 07/04/2018 7:24 PM

## 2018-07-05 ENCOUNTER — Inpatient Hospital Stay (HOSPITAL_COMMUNITY): Payer: Medicare Other

## 2018-07-05 DIAGNOSIS — E039 Hypothyroidism, unspecified: Secondary | ICD-10-CM

## 2018-07-05 DIAGNOSIS — J449 Chronic obstructive pulmonary disease, unspecified: Secondary | ICD-10-CM

## 2018-07-05 DIAGNOSIS — I96 Gangrene, not elsewhere classified: Secondary | ICD-10-CM

## 2018-07-05 DIAGNOSIS — I739 Peripheral vascular disease, unspecified: Secondary | ICD-10-CM

## 2018-07-05 DIAGNOSIS — M86671 Other chronic osteomyelitis, right ankle and foot: Secondary | ICD-10-CM

## 2018-07-05 DIAGNOSIS — E441 Mild protein-calorie malnutrition: Secondary | ICD-10-CM

## 2018-07-05 DIAGNOSIS — E7800 Pure hypercholesterolemia, unspecified: Secondary | ICD-10-CM

## 2018-07-05 LAB — GLUCOSE, CAPILLARY
Glucose-Capillary: 206 mg/dL — ABNORMAL HIGH (ref 70–99)
Glucose-Capillary: 138 mg/dL — ABNORMAL HIGH (ref 70–99)
Glucose-Capillary: 217 mg/dL — ABNORMAL HIGH (ref 70–99)
Glucose-Capillary: 241 mg/dL — ABNORMAL HIGH (ref 70–99)

## 2018-07-05 LAB — LACTIC ACID, PLASMA: Lactic Acid, Venous: 1.7 mmol/L (ref 0.5–1.9)

## 2018-07-05 LAB — CK: CK TOTAL: 87 U/L (ref 38–234)

## 2018-07-05 MED ORDER — INSULIN ASPART 100 UNIT/ML ~~LOC~~ SOLN
0.0000 [IU] | Freq: Every day | SUBCUTANEOUS | Status: DC
  Administered 2018-07-05: 2 [IU] via SUBCUTANEOUS

## 2018-07-05 MED ORDER — INSULIN GLARGINE 100 UNIT/ML ~~LOC~~ SOLN
10.0000 [IU] | Freq: Every day | SUBCUTANEOUS | Status: DC
  Administered 2018-07-05 – 2018-07-06 (×2): 10 [IU] via SUBCUTANEOUS
  Filled 2018-07-05 (×2): qty 0.1

## 2018-07-05 MED ORDER — INSULIN ASPART 100 UNIT/ML ~~LOC~~ SOLN
0.0000 [IU] | Freq: Three times a day (TID) | SUBCUTANEOUS | Status: DC
  Administered 2018-07-06 (×2): 5 [IU] via SUBCUTANEOUS
  Administered 2018-07-06: 3 [IU] via SUBCUTANEOUS
  Administered 2018-07-07 (×2): 11 [IU] via SUBCUTANEOUS
  Administered 2018-07-07: 3 [IU] via SUBCUTANEOUS
  Administered 2018-07-08: 5 [IU] via SUBCUTANEOUS
  Administered 2018-07-08 – 2018-07-09 (×3): 3 [IU] via SUBCUTANEOUS
  Administered 2018-07-09 (×2): 2 [IU] via SUBCUTANEOUS
  Administered 2018-07-10 – 2018-07-11 (×6): 3 [IU] via SUBCUTANEOUS

## 2018-07-05 NOTE — Progress Notes (Signed)
ABI/TBI exam completed. Please see preliminary notes on CV PROC under chart review.  Note: Bilateral Calf-Brachial Index per MD's requests.  Gagan Dillion H Delecia Vastine(RDMS RVT) 07/05/18 4:33 PM

## 2018-07-05 NOTE — Progress Notes (Signed)
PROGRESS NOTE   [name]  HDQ:[id number]    DOB: Mar 22, 1961    DOA: 07/04/2018  PCP: Chipper Herb, MD   I have briefly reviewed patients previous medical records in [facility].  Brief Narrative:  58 year old female with PMH of COPD, DM 2, DVT, GERD, HLD, HTN, hypothyroid, obesity, PAD with prior toe amputations, depression presented to [facility] ED on 1/27 due to chronic nonhealing wound of right great toe with purulent drainage, seen by PCP and admitted for right great toe osteomyelitis.  Orthopedics and vascular surgery consulted.  Undergoing work-up to determine extent of surgery i.e. BKA versus AKA.   Assessment & Plan:   Principal Problem:   Osteomyelitis of great toe of right foot (HCC) Active Problems:   Diabetic infection of right foot (HCC)   Hyperlipidemia   SMOKER   Essential hypertension   GERD (gastroesophageal reflux disease)   Chronic obstructive pulmonary disease (HCC)   Obesity (BMI 30-39.9)   Hypothyroidism   Peripheral vascular insufficiency (HCC)   Deep vein thrombosis (DVT) of both lower extremities (HCC)   Intermittent claudication (HCC)   Mild protein-calorie malnutrition (HCC)   Severe PAD with right great toe osteomyelitis/diabetic foot infection - Orthopedics/Dr. Sharol Given was initially consulted who in turn consulted vascular surgery. - As per vascular surgery input, no further options for revascularization. - Awaiting on noninvasive studies (patient does not want arteriogram) to determine extent of surgery i.e. BKA versus AKA. - Continue empiric IV Zosyn and daptomycin (reacted to vancomycin). -Continue aspirin and statins.   -I discussed with Dr. Scot Dock: Hold Plavix and Coumadin for possible upcoming surgery.  Type II DM with peripheral neuropathy -Hold metformin. -Start Lantus 10 units at bedtime and continue SSI.  DC mealtime NovoLog for now.  Monitor closely and adjust insulins as needed. -A1c 8.8 suggest poor outpatient  control.  Essential hypertension Reasonable inpatient control.  Continue losartan.  Hyperlipidemia Continue atorvastatin  Hypothyroid Clinically euthyroid.  Continue Synthroid.  GERD Asymptomatic.  Continue PPI.  COPD Stable without clinical bronchospasm.  Tobacco abuse Cessation counseled.  Obesity/Body mass index is 33.15 kg/m.  History of DVTs States that she has been on Coumadin for 13 years.  Also on aspirin 325 mg daily and Plavix.  Currently Coumadin and Plavix held for possible surgery.  DVT prophylaxis: Lovenox Code Status: Full Family Communication: None at bedside Disposition: To be determined pending clinical improvement and possible surgery.   Consultants:  Orthopedic/Dr. Sharol Given Vascular surgery  Procedures:  None  Antimicrobials:  IV vancomycin-discontinued IV daptomycin and Zosyn.   Subjective: Patient reported that she had purulent drainage from right great toe prior to admission.  No pain.  Denied complaints.  No dyspnea or chest pain.  ROS: As above, otherwise negative.  Objective:  Vitals:   07/04/18 1853 07/04/18 2101 07/05/18 0507 07/05/18 1226  BP: 129/75 112/77 (!) 155/85   Pulse: 66 76 62   Resp: 18 12 12    Temp: 97.6 F (36.4 C) 98.2 F (36.8 C) (!) 97.2 F (36.2 C) 97.6 F (36.4 C)  TempSrc: Oral  Oral Oral  SpO2: 99% 98% 96%   Weight: 104.8 kg     Height: 5\' 10"  (1.778 m)       Examination:  General exam: Pleasant young female, moderately built and obese sitting up comfortably in bed. Respiratory system: Clear to auscultation. Respiratory effort normal. Cardiovascular system: S1 & S2 heard, RRR. No JVD, murmurs, rubs, gallops or clicks. No pedal edema. Gastrointestinal system: Abdomen is  nondistended, soft and nontender. No organomegaly or masses felt. Normal bowel sounds heard. Central nervous system: Alert and oriented. No focal neurological deficits. Extremities: Symmetric 5 x 5 power.  Unable to palpate popliteal  pulses bilaterally or pulses distally.  Absence of hair bilateral lower extremities. Skin: Tip of right great toe with dry gangrene.  No surrounding erythema and no drainage at this time.  Amputated right fourth and fifth toes. Psychiatry: Judgement and insight appear normal. Mood & affect appropriate.     Data Reviewed: I have personally reviewed following labs and imaging studies  CBC: Recent Labs  Lab 07/04/18 1209 07/04/18 1641  WBC 10.9* 11.1*  NEUTROABS 5.5 5.7  HGB 15.3* 14.7  HCT 48.8* 45.7  MCV 90.7 88.6  PLT 231 269   Basic Metabolic Panel: Recent Labs  Lab 07/04/18 1209 07/04/18 1641  NA 139 137  K 4.2 3.8  CL 98 98  CO2 26 28  GLUCOSE 203* 157*  BUN 11 12  CREATININE 0.78 0.75  CALCIUM 9.6 9.3   Liver Function Tests: Recent Labs  Lab 07/04/18 1209 07/04/18 1641  AST 20 20  ALT 22 19  ALKPHOS 57 48  BILITOT 0.3 0.5  PROT 7.8 7.2  ALBUMIN 3.5 3.2*   Coagulation Profile: Recent Labs  Lab 07/04/18 1209 07/04/18 1641  INR 1.52 1.54   Cardiac Enzymes: Recent Labs  Lab 07/05/18 0213  CKTOTAL 87   HbA1C: Recent Labs    07/04/18 1641  HGBA1C 8.8*   CBG: Recent Labs  Lab 07/04/18 2154 07/05/18 0759 07/05/18 1215 07/05/18 1734  GLUCAP 199* 206* 138* 217*    Recent Results (from the past 240 hour(s))  Culture, blood (Routine x 2)     Status: None (Preliminary result)   Collection Time: 07/04/18 12:09 PM  Result Value Ref Range Status   Specimen Description BLOOD LEFT HAND  Final   Special Requests   Final    BOTTLES DRAWN AEROBIC ONLY Blood Culture adequate volume   Culture   Final    NO GROWTH < 24 HOURS Performed at [facility], [place] [address] [place], [place] [address]    Report Status PENDING  Incomplete  Culture, blood (Routine x 2)     Status: None (Preliminary result)   Collection Time: 07/04/18  5:20 PM  Result Value Ref Range Status   Specimen Description BLOOD RIGHT ANTECUBITAL  Final   Special Requests    Final    BOTTLES DRAWN AEROBIC AND ANAEROBIC Blood Culture adequate volume   Culture   Final    NO GROWTH < 24 HOURS Performed at [facility], [place] [address] [place], [place] [address]    Report Status PENDING  Incomplete         Radiology Studies: Dg Toe Great Right  Result Date: 07/04/2018 CLINICAL DATA:  Diabetic infection of the right foot. EXAM: RIGHT GREAT TOE COMPARISON:  Radiographs dated 09/01/2006 FINDINGS: There is erosion of the dorsal aspect of the tip of the tuft of the distal phalanx of the right great toe as compared to the prior exam. There has been previous amputation of the fourth and fifth toes at the level of the base of the proximal phalanges. This is unchanged. The other bones of the visualized portion of the forefoot appear normal. IMPRESSION: Focal osteomyelitis of the tip of the tuft of the distal phalanx of the great toe. Electronically Signed   By: Lorriane Shire M.D.   On: 07/04/2018 09:15  Vas [place] Burnard Bunting With/wo Tbi  Result Date: 07/05/2018 LOWER EXTREMITY DOPPLER STUDY Indications: Gangrene, and PVD ; Establish baseline vascular status. High Risk Factors: Hypertension, hyperlipidemia, Diabetes. Other Factors: Cigerrate.  Vascular Interventions: Femoral bypass x5. Comparison Study: ABI/TBI on 04/24/2014 Performing Technologist: Rudell Cobb RDMS RVT  Examination Guidelines: A complete evaluation includes at minimum, Doppler waveform signals and systolic blood pressure reading at the level of bilateral brachial, anterior tibial, and posterior tibial arteries, when vessel segments are accessible. Bilateral testing is considered an integral part of a complete examination. Photoelectric Plethysmograph (PPG) waveforms and toe systolic pressure readings are included as required and additional duplex testing as needed. Limited examinations for reoccurring indications may be performed as noted.  ABI Findings: +---------+------------------+-----+----------+-------------+  Right    Rt Pressure (mmHg)IndexWaveform  Comment       +---------+------------------+-----+----------+-------------+ Brachial 165                    triphasic               +---------+------------------+-----+----------+-------------+ ATA      71                0.43 monophasiccalf pressure +---------+------------------+-----+----------+-------------+ PTA      74                0.45 monophasic              +---------+------------------+-----+----------+-------------+ DP       81                0.49 monophasic              +---------+------------------+-----+----------+-------------+ Great Toe66                0.40                         +---------+------------------+-----+----------+-------------+ +---------+------------------+-----+-------------+------------------------+ Left     Lt Pressure (mmHg)IndexWaveform     Comment                  +---------+------------------+-----+-------------+------------------------+ Brachial 163                    triphasic                             +---------+------------------+-----+-------------+------------------------+ ATA      108               0.65 biphasic     Calf pressure            +---------+------------------+-----+-------------+------------------------+ PTA      136               0.82 monophasic                            +---------+------------------+-----+-------------+------------------------+ DP       99                0.60 monophasic                            +---------+------------------+-----+-------------+------------------------+ Great Toe52                0.32 Almost absentwaveform is very flatten +---------+------------------+-----+-------------+------------------------+ +-------+-----------+-----------+------------+------------+ ABI/TBIToday's ABIToday's TBIPrevious ABIPrevious TBI +-------+-----------+-----------+------------+------------+ Right  0.49       0.40  0.39         0.40         +-------+-----------+-----------+------------+------------+ Left   0.82       0.32       0.78        0.32         +-------+-----------+-----------+------------+------------+ Right Calf-Brachial Index=0.43 Left Calf-Brachial Index=0.65. Right ABIs and TBIs appear increased compared to prior study on 04/24/2014. Left ABIs appear decreased compared to prior study on 04/24/2014. Bilateral TBIs essentially unchanged compares to previous study on 2015  Summary: Right: Resting right ankle-brachial index indicates severe right lower extremity arterial disease. The right toe-brachial index is abnormal. Right Calf-Brachial Index=0.43  Left: Resting left ankle-brachial index indicates mild left lower extremity arterial disease. The left toe-brachial index is abnormal. Left Calf-Brachial Index=0.65  *See table(s) above for measurements and observations.     Preliminary         Scheduled Meds: . aspirin  325 mg Oral Daily  . atorvastatin  40 mg Oral Daily  . cholecalciferol  5,000 Units Oral Daily  . enoxaparin (LOVENOX) injection  40 mg Subcutaneous Q24H  . escitalopram  30 mg Oral Daily  . furosemide  20 mg Oral Daily  . insulin aspart  0-20 Units Subcutaneous TID WC  . insulin aspart  0-5 Units Subcutaneous QHS  . insulin aspart  6 Units Subcutaneous TID WC  . levothyroxine  100 mcg Oral Daily  . losartan  50 mg Oral Daily  . mupirocin ointment  1 application Topical BID  . pantoprazole  40 mg Oral Daily  . umeclidinium bromide  1 puff Inhalation Daily   Continuous Infusions: . DAPTOmycin (CUBICIN)  IV 600 mg (07/04/18 2013)  . piperacillin-tazobactam (ZOSYN)  IV 3.375 g (07/05/18 1410)     LOS: 1 day     Vernell Leep, MD, FACP, [facility]  To contact the attending provider between 7A-7P or the covering provider during after hours 7P-7A, please log into the web site [email or web address] and access using universal [place] password for that web site. If  you do not have the password, please call the hospital operator.  07/05/2018, 6:44 PM

## 2018-07-05 NOTE — Consult Note (Addendum)
Hospital Consult VASCULAR SURGERY ASSESSMENT & PLAN:   OSTEOMYELITIS RIGHT GREAT TOE: This patient has undergone multiple previous revascularization attempts in the past by Dr. Donnetta Hutching.  She was last seen in our office in 2015 and at that time it was felt that there were no further options for revascularization.  I did discuss with her today the possibility of considering an arteriogram however she felt strongly that she did not want an arteriogram and knew that she would require amputation.  On exam she has a monophasic dorsalis pedis signal only in the right foot.  I suspect that she does not have adequate circulation to heal a toe amputation.  I have explained that the noninvasive studies today will help [place] predict her chances of healing a below the knee versus an above-the-knee amputation and the studies are pending.  We will make further recommendations pending these results.  Deitra Mayo, MD, [place] [phone] Office: [phone]  Reason for Consult:  R GT osteomyelitis, PAD Requesting Physician:  Dr. Sharol Given MRN #:  [id number]  History of Present Illness: This is a 58 y.o. female with past medical history significant for uncontrolled diabetes mellitus type 2 per patient, COPD, hyperlipidemia, hypertension, and peripheral arterial disease.  She is being seen in consultation due to right great toe osteomyelitis demonstrated by plain film in the presence of severe peripheral arterial disease.  She is well-known to vein and vascular surgery with a complex past surgical history.  She had her initial right femoral to popliteal bypass with saphenous vein in August 2007 by Dr. Donnetta Hutching.  She then underwent fourth and fifth toe amputation likely due to atheroemboli.  She then had multiple revisions of right lower extremity bypass including thrombectomy, revision with left greater saphenous vein, as well as revision with PTFE graft.  Latest revision was noted to be occluded in 2011.  As of office visit in  2015 right ABI was 0.4 and left ABI of 0.32.  At that time Dr. Donnetta Hutching considered the patient unreconstructable.  On exam today she is still without rest pain.  She does suffer from diabetic neuropathy.  She is aware that she will likely require a more proximal amputation.  She is currently on IV antibiotics without obvious cellulitis of right foot or ascending lymphangitis.  She continues to smoke half a pack a day.  She is on aspirin, Plavix, and statin.  Past Medical History:  Diagnosis Date  . COPD (chronic obstructive pulmonary disease) ([place])   . Depression   . DM (diabetes mellitus) ([place])   . DVT (deep venous thrombosis) ([facility])    x5  . Family history of colon cancer   . GERD (gastroesophageal reflux disease)   . Hyperlipemia   . Hypertension   . Hypothyroid   . Obesity   . PVD (peripheral vascular disease) ([place])   . Uterine cancer ([place])   . UTI (urinary tract infection)     Past Surgical History:  Procedure Laterality Date  . ABDOMINAL HYSTERECTOMY    . CESAREAN SECTION    . FEMORAL BYPASS     x 5  . LUMBAR DISC SURGERY     L4-L5  . TOE AMPUTATION     right  . TONSILLECTOMY      Allergies  Allergen Reactions  . Aleve [Naproxen Sodium] Hives and Itching  . Cortisone Swelling    Internal organs  . Prednisone Swelling    Makes internal organs swell  . Vancomycin Itching    Prior to Admission  medications   Medication Sig Start Date End Date Taking? Authorizing Provider  albuterol (PROVENTIL HFA;VENTOLIN HFA) 108 (90 Base) MCG/ACT inhaler Inhale 2 puffs into the lungs every 6 (six) hours as needed for wheezing or shortness of breath. 03/14/18  Yes Chipper Herb, MD  aspirin 325 MG tablet Take 325 mg by mouth daily.   Yes [provider]  atorvastatin (LIPITOR) 40 MG tablet TAKE 1 TABLET DAILY Patient taking differently: Take 40 mg by mouth daily.  06/09/18  Yes Chipper Herb, MD  Cholecalciferol (VITAMIN D3) 5000 units TABS Take 1 tablet by mouth daily.   Yes  [provider]  clopidogrel (PLAVIX) 75 MG tablet TAKE 1 TABLET DAILY Patient taking differently: Take 75 mg by mouth once.  06/15/18  Yes Chipper Herb, MD  escitalopram (LEXAPRO) 20 MG tablet TAKE 1 & 1/2 TABLETS ONCE DAILY Patient taking differently: 30 mg daily.  02/11/18  Yes Chipper Herb, MD  furosemide (LASIX) 20 MG tablet TAKE 1 TABLET DAILY Patient taking differently: Take 20 mg by mouth daily.  06/15/18  Yes Chipper Herb, MD  levothyroxine (SYNTHROID, LEVOTHROID) 100 MCG tablet TAKE (1) TABLET DAILY BE- FORE BREAKFAST. Patient taking differently: Take 100 mcg by mouth daily.  02/11/18  Yes Chipper Herb, MD  losartan (COZAAR) 50 MG tablet TAKE 1 TABLET DAILY Patient taking differently: Take 50 mg by mouth daily.  06/09/18  Yes Chipper Herb, MD  metFORMIN (GLUCOPHAGE-XR) 500 MG 24 hr tablet TAKE 2 TABLET ONCE DAILY WITH BREAKFAST Patient taking differently: Take 1,000 mg by mouth daily with breakfast.  03/14/18  Yes Chipper Herb, MD  mupirocin ointment (BACTROBAN) 2 % APPLY TO THE NOSE AS DIRECTED 2 TIMES A DAY Patient taking differently: Apply 1 application topically 2 (two) times daily. To nose as directed.   Yes Chipper Herb, MD  omeprazole (PRILOSEC) 20 MG capsule TAKE (1) CAPSULE DAILY Patient taking differently: Take 20 mg by mouth daily.  06/10/18  Yes Chipper Herb, MD  Potassium Gluconate 595 MG CAPS Take 1 capsule by mouth daily.   Yes [provider]  tretinoin (RETIN-A) 0.05 % cream APPLY TO AFFECTED AREAS AT BEDTIME Patient taking differently: Apply 1 application topically at bedtime. To affected areas. 05/16/18  Yes Gottschalk, Ashly M, DO  umeclidinium bromide (INCRUSE ELLIPTA) 62.5 MCG/INH AEPB Inhale 1 puff into the lungs daily. 08/27/16  Yes Chipper Herb, MD  warfarin (COUMADIN) 5 MG tablet TAKE AS DIRECTED UP TO 1 & 1/2 TABLETS A DAY Patient taking differently: Take 5-7.5 mg by mouth See admin instructions. Take as directed up to 7.5mg   daily 06/15/18  Yes Chipper Herb, MD  hydrOXYzine (ATARAX/VISTARIL) 10 MG tablet Take 1 tablet (10 mg total) by mouth 3 (three) times daily as needed. 07/04/18   Baruch Gouty, FNP  ONE TOUCH ULTRA TEST test strip TEST BLOOD SUGARS TWICE A DAY 03/12/16   Chipper Herb, MD    Social History   Socioeconomic History  . Marital status: Married    Spouse name: Not on file  . Number of children: 1  . Years of education: Not on file  . Highest education level: Not on file  Occupational History  . Occupation: Product manager: [employer]  . Financial resource strain: Not on file  . Food insecurity:    Worry: Not on file    Inability: Not on file  . Transportation needs:  Medical: Not on file    Non-medical: Not on file  Tobacco Use  . Smoking status: Current Every Day Smoker    Packs/day: 0.50    Years: 38.00    Pack years: 19.00    Types: Cigarettes  . Smokeless tobacco: Never Used  Substance and Sexual Activity  . Alcohol use: No    Alcohol/week: 0.0 standard drinks  . Drug use: No  . Sexual activity: Not on file  Lifestyle  . Physical activity:    Days per week: Not on file    Minutes per session: Not on file  . Stress: Not on file  Relationships  . Social connections:    Talks on phone: Not on file    Gets together: Not on file    Attends religious service: Not on file    Active member of club or organization: Not on file    Attends meetings of clubs or organizations: Not on file    Relationship status: Not on file  . Intimate partner violence:    Fear of current or ex partner: Not on file    Emotionally abused: Not on file    Physically abused: Not on file    Forced sexual activity: Not on file  Other Topics Concern  . Not on file  Social History Narrative  . Not on file     Family History  Problem Relation Age of Onset  . Colon cancer Brother 9  . Hypertension Mother   . Hypothyroidism Mother   . Heart disease Father   .  Diabetes Father   . Hyperlipidemia Father   . Clotting disorder Brother   . Diabetes Brother   . Heart disease Brother     ROS: Otherwise negative unless mentioned in HPI  Physical Examination  Vitals:   07/05/18 0507 07/05/18 1226  BP: (!) 155/85   Pulse: 62   Resp: 12   Temp: (!) 97.2 F (36.2 C) 97.6 F (36.4 C)  SpO2: 96%    Body mass index is 33.15 kg/m.  General:  WDWN in NAD Gait: Not observed HENT: WNL, normocephalic Pulmonary: normal non-labored breathing, without Rales, rhonchi,  wheezing Cardiac: regular Abdomen:  soft, NT/ND, no masses Skin: without rashes Vascular Exam/Pulses: Symmetrical radial pulses; palpable femoral pulses right greater than left; unable to palpate popliteal pulses or pedal pulses Extremities: Feet are symmetrically warm to touch; tip of right great toe with dry gangrene Musculoskeletal: no muscle wasting or atrophy  Neurologic: A&O X 3;  No focal weakness or paresthesias are detected; speech is fluent/normal Psychiatric:  The pt has Normal affect. Lymph:  Unremarkable  CBC    Component Value Date/Time   WBC 11.1 (H) 07/04/2018 1641   RBC 5.16 (H) 07/04/2018 1641   HGB 14.7 07/04/2018 1641   HGB 14.5 03/15/2018 1358   HCT 45.7 07/04/2018 1641   HCT 42.7 03/15/2018 1358   PLT 236 07/04/2018 1641   PLT 227 03/15/2018 1358   MCV 88.6 07/04/2018 1641   MCV 87 03/15/2018 1358   MCH 28.5 07/04/2018 1641   MCHC 32.2 07/04/2018 1641   RDW 13.6 07/04/2018 1641   RDW 14.1 03/15/2018 1358   LYMPHSABS 4.3 (H) 07/04/2018 1641   LYMPHSABS 2.8 03/15/2018 1358   MONOABS 0.9 07/04/2018 1641   EOSABS 0.2 07/04/2018 1641   EOSABS 0.2 03/15/2018 1358   BASOSABS 0.1 07/04/2018 1641   BASOSABS 0.0 03/15/2018 1358    BMET    Component Value Date/Time   NA  137 07/04/2018 1641   NA 145 (H) 03/15/2018 1358   K 3.8 07/04/2018 1641   CL 98 07/04/2018 1641   CO2 28 07/04/2018 1641   GLUCOSE 157 (H) 07/04/2018 1641   BUN 12 07/04/2018 1641     BUN 9 03/15/2018 1358   CREATININE 0.75 07/04/2018 1641   CREATININE 0.76 12/21/2012 1707   CALCIUM 9.3 07/04/2018 1641   GFRNONAA >60 07/04/2018 1641   GFRNONAA >89 12/21/2012 1707   GFRAA >60 07/04/2018 1641   GFRAA >89 12/21/2012 1707    COAGS: Lab Results  Component Value Date   INR 1.54 07/04/2018   INR 1.52 07/04/2018   INR 1.3 (H) 12/02/2015   X-ray right foot: X-ray of the right foot shows focal osteomyelitis of the tip of the tuft of the distal phalanx of the great toe.  Non-Invasive Vascular Imaging:   ABIs pending   ASSESSMENT/PLAN: This is a 58 y.o. female with severe PAD now with right great toe osteomyelitis  -ABIs pending however as of 2015 right ABI of 0.4 and left ABI of 0.32 -Right great toe osteomyelitis unlikely to heal with IV antibiotics or great toe amputation -Patient was considered unreconstructable and without revascularization options after several revisions of right lower extremity bypass -Unsure of utility of right lower extremity arteriogram -Patient aware she is at risk for more proximal leg amputation likely above-the-knee -On-call vascular surgeon Dr. Scot Dock will evaluate the patient later today and provide further treatment plan  Dagoberto Ligas PA-C [facility] [phone]

## 2018-07-05 NOTE — Consult Note (Signed)
ORTHOPAEDIC CONSULTATION  REQUESTING PHYSICIAN: Modena Jansky, MD  Chief Complaint: Osteomyelitis right great toe  HPI: [name] is a 58 y.o. female who presents with osteomyelitis right great toe.  Patient has diabetic insensate neuropathy with severe peripheral vascular disease.  Years ago patient underwent limb salvage intervention with multiple surgical interventions with Dr. early for the right lower extremity.  Patient states she has not seen Dr. early in a long period of time.  Past Medical History:  Diagnosis Date  . COPD (chronic obstructive pulmonary disease) ([place])   . Depression   . DM (diabetes mellitus) ([place])   . DVT (deep venous thrombosis) ([facility])    x5  . Family history of colon cancer   . GERD (gastroesophageal reflux disease)   . Hyperlipemia   . Hypertension   . Hypothyroid   . Obesity   . PVD (peripheral vascular disease) ([place])   . Uterine cancer ([place])   . UTI (urinary tract infection)    Past Surgical History:  Procedure Laterality Date  . ABDOMINAL HYSTERECTOMY    . CESAREAN SECTION    . FEMORAL BYPASS     x 5  . LUMBAR DISC SURGERY     L4-L5  . TOE AMPUTATION     right  . TONSILLECTOMY     Social History   Socioeconomic History  . Marital status: Married    Spouse name: Not on file  . Number of children: 1  . Years of education: Not on file  . Highest education level: Not on file  Occupational History  . Occupation: Product manager: [employer]  . Financial resource strain: Not on file  . Food insecurity:    Worry: Not on file    Inability: Not on file  . Transportation needs:    Medical: Not on file    Non-medical: Not on file  Tobacco Use  . Smoking status: Current Every Day Smoker    Packs/day: 0.50    Years: 38.00    Pack years: 19.00    Types: Cigarettes  . Smokeless tobacco: Never Used  Substance and Sexual Activity  . Alcohol use: No    Alcohol/week: 0.0 standard drinks  .  Drug use: No  . Sexual activity: Not on file  Lifestyle  . Physical activity:    Days per week: Not on file    Minutes per session: Not on file  . Stress: Not on file  Relationships  . Social connections:    Talks on phone: Not on file    Gets together: Not on file    Attends religious service: Not on file    Active member of club or organization: Not on file    Attends meetings of clubs or organizations: Not on file    Relationship status: Not on file  Other Topics Concern  . Not on file  Social History Narrative  . Not on file   Family History  Problem Relation Age of Onset  . Colon cancer Brother 50  . Hypertension Mother   . Hypothyroidism Mother   . Heart disease Father   . Diabetes Father   . Hyperlipidemia Father   . Clotting disorder Brother   . Diabetes Brother   . Heart disease Brother    - negative except otherwise stated in the family history section Allergies  Allergen Reactions  . Aleve [Naproxen Sodium] Hives and Itching  . Cortisone Swelling    Internal  organs  . Prednisone Swelling    Makes internal organs swell  . Vancomycin Itching   Prior to Admission medications   Medication Sig Start Date End Date Taking? Authorizing Provider  albuterol (PROVENTIL HFA;VENTOLIN HFA) 108 (90 Base) MCG/ACT inhaler Inhale 2 puffs into the lungs every 6 (six) hours as needed for wheezing or shortness of breath. 03/14/18  Yes Chipper Herb, MD  aspirin 325 MG tablet Take 325 mg by mouth daily.   Yes [provider]  atorvastatin (LIPITOR) 40 MG tablet TAKE 1 TABLET DAILY Patient taking differently: Take 40 mg by mouth daily.  06/09/18  Yes Chipper Herb, MD  Cholecalciferol (VITAMIN D3) 5000 units TABS Take 1 tablet by mouth daily.   Yes [provider]  clopidogrel (PLAVIX) 75 MG tablet TAKE 1 TABLET DAILY Patient taking differently: Take 75 mg by mouth once.  06/15/18  Yes Chipper Herb, MD  escitalopram (LEXAPRO) 20 MG tablet TAKE 1 & 1/2  TABLETS ONCE DAILY Patient taking differently: 30 mg daily.  02/11/18  Yes Chipper Herb, MD  furosemide (LASIX) 20 MG tablet TAKE 1 TABLET DAILY Patient taking differently: Take 20 mg by mouth daily.  06/15/18  Yes Chipper Herb, MD  levothyroxine (SYNTHROID, LEVOTHROID) 100 MCG tablet TAKE (1) TABLET DAILY BE- FORE BREAKFAST. Patient taking differently: Take 100 mcg by mouth daily.  02/11/18  Yes Chipper Herb, MD  losartan (COZAAR) 50 MG tablet TAKE 1 TABLET DAILY Patient taking differently: Take 50 mg by mouth daily.  06/09/18  Yes Chipper Herb, MD  metFORMIN (GLUCOPHAGE-XR) 500 MG 24 hr tablet TAKE 2 TABLET ONCE DAILY WITH BREAKFAST Patient taking differently: Take 1,000 mg by mouth daily with breakfast.  03/14/18  Yes Chipper Herb, MD  mupirocin ointment (BACTROBAN) 2 % APPLY TO THE NOSE AS DIRECTED 2 TIMES A DAY Patient taking differently: Apply 1 application topically 2 (two) times daily. To nose as directed.   Yes Chipper Herb, MD  omeprazole (PRILOSEC) 20 MG capsule TAKE (1) CAPSULE DAILY Patient taking differently: Take 20 mg by mouth daily.  06/10/18  Yes Chipper Herb, MD  Potassium Gluconate 595 MG CAPS Take 1 capsule by mouth daily.   Yes [provider]  tretinoin (RETIN-A) 0.05 % cream APPLY TO AFFECTED AREAS AT BEDTIME Patient taking differently: Apply 1 application topically at bedtime. To affected areas. 05/16/18  Yes Gottschalk, Ashly M, DO  umeclidinium bromide (INCRUSE ELLIPTA) 62.5 MCG/INH AEPB Inhale 1 puff into the lungs daily. 08/27/16  Yes Chipper Herb, MD  warfarin (COUMADIN) 5 MG tablet TAKE AS DIRECTED UP TO 1 & 1/2 TABLETS A DAY Patient taking differently: Take 5-7.5 mg by mouth See admin instructions. Take as directed up to 7.5mg  daily 06/15/18  Yes Chipper Herb, MD  hydrOXYzine (ATARAX/VISTARIL) 10 MG tablet Take 1 tablet (10 mg total) by mouth 3 (three) times daily as needed. 07/04/18   Baruch Gouty, FNP  ONE TOUCH ULTRA TEST test strip TEST  BLOOD SUGARS TWICE A DAY 03/12/16   Chipper Herb, MD   Dg Toe Great Right  Result Date: 07/04/2018 CLINICAL DATA:  Diabetic infection of the right foot. EXAM: RIGHT GREAT TOE COMPARISON:  Radiographs dated 09/01/2006 FINDINGS: There is erosion of the dorsal aspect of the tip of the tuft of the distal phalanx of the right great toe as compared to the prior exam. There has been previous amputation of the fourth and fifth toes at  the level of the base of the proximal phalanges. This is unchanged. The other bones of the visualized portion of the forefoot appear normal. IMPRESSION: Focal osteomyelitis of the tip of the tuft of the distal phalanx of the great toe. Electronically Signed   By: Lorriane Shire M.D.   On: 07/04/2018 09:15   - pertinent xrays, CT, MRI studies were reviewed and independently interpreted  Positive ROS: All other systems have been reviewed and were otherwise negative with the exception of those mentioned in the HPI and as above.  Physical Exam: General: Alert, no acute distress Psychiatric: Patient is competent for consent with normal mood and affect Lymphatic: No axillary or cervical lymphadenopathy Cardiovascular: No pedal edema Respiratory: No cyanosis, no use of accessory musculature GI: No organomegaly, abdomen is soft and non-tender    Images:  @ENCIMAGES @  Labs:  Lab Results  Component Value Date   HGBA1C 8.8 (H) 07/04/2018   HGBA1C 8.4 (H) 03/15/2018   HGBA1C 7.6 (H) 09/10/2017   ESRSEDRATE 19 10/25/2012   CRP 0.6 10/25/2012   LABURIC 5.7 12/10/2006    Lab Results  Component Value Date   ALBUMIN 3.2 (L) 07/04/2018   ALBUMIN 3.5 07/04/2018   ALBUMIN 3.6 03/15/2018   LABURIC 5.7 12/10/2006    Neurologic: Patient does not have protective sensation bilateral lower extremities.   MUSCULOSKELETAL:   Skin: Examination patient's skin on the right foot is thin and atrophic.  She has superficial eschar to the tip of the right great toe she is status  post amputation of the fourth and fifth toes.  Does not have a draining sinus tract there is no sausage digit swelling.  Patient does not have a palpable dorsalis pedis or posterior tibial pulse.  Ankle-brachial indices were ordered but not obtained yet.  Review of the x-ray shows osteomyelitis of the tuft of the right great toe.  There is no ascending cellulitis.  Patient has a hemoglobin A1c of 8.8 with an albumin of 3.2.   Assessment: Assessment: Diabetic insensate neuropathy with mild protein caloric malnutrition with severe peripheral vascular disease with osteomyelitis of the tuft of the right great toe status post revascularization to the right lower extremity with amputation of toes 4 and 5.  Plan: Plan: I will consult vascular vein surgery, patient will need arterial studies, and possibly revascularization prior to considering definitive surgery for the osteomyelitis of the great toe.  Thank you for the consult and the opportunity to see Ms. Bancroft, MD [place] [phone] 6:45 AM

## 2018-07-06 ENCOUNTER — Telehealth: Payer: Self-pay | Admitting: Family Medicine

## 2018-07-06 DIAGNOSIS — E11628 Type 2 diabetes mellitus with other skin complications: Secondary | ICD-10-CM

## 2018-07-06 DIAGNOSIS — I82403 Acute embolism and thrombosis of unspecified deep veins of lower extremity, bilateral: Secondary | ICD-10-CM

## 2018-07-06 DIAGNOSIS — L089 Local infection of the skin and subcutaneous tissue, unspecified: Secondary | ICD-10-CM

## 2018-07-06 DIAGNOSIS — I10 Essential (primary) hypertension: Secondary | ICD-10-CM

## 2018-07-06 DIAGNOSIS — M869 Osteomyelitis, unspecified: Secondary | ICD-10-CM

## 2018-07-06 LAB — GLUCOSE, CAPILLARY
Glucose-Capillary: 212 mg/dL — ABNORMAL HIGH (ref 70–99)
Glucose-Capillary: 157 mg/dL — ABNORMAL HIGH (ref 70–99)
Glucose-Capillary: 238 mg/dL — ABNORMAL HIGH (ref 70–99)
Glucose-Capillary: 186 mg/dL — ABNORMAL HIGH (ref 70–99)

## 2018-07-06 MED ORDER — NICOTINE 21 MG/24HR TD PT24
21.0000 mg | MEDICATED_PATCH | Freq: Every day | TRANSDERMAL | Status: DC
  Administered 2018-07-06 – 2018-07-11 (×6): 21 mg via TRANSDERMAL
  Filled 2018-07-06 (×6): qty 1

## 2018-07-06 NOTE — H&P (View-Only) (Signed)
Patient ID: [name], female   DOB: 29-Jul-1960, 58 y.o.   MRN: [id number] Patient seen in follow-up for her severe peripheral vascular disease as well as osteomyelitis of the right great toe.  Patient has had further vascular work-up and does not have any further revascularization options.  Discussed  her options of a transtibial amputation versus above-knee amputation.  Discussed that while she is at increased risk of wound healing of the below the knee amputation that she has a better chance of ambulating with a prosthesis with a below the knee amputations and above-knee amputation.  Patient states she understands wished to proceed with surgery we will plan for a right transtibial amputation on Friday.  Patient has a medical care at home and I will put in orders for inpatient rehab this would be ideal for her.  Again discussed the importance of smoking cessation for wound healing the importance of  Whey based protein powder supplements.

## 2018-07-06 NOTE — Progress Notes (Signed)
Patient ID: [name], female   DOB: [birth year], 58 y.o.   MRN: [id number]  PROGRESS NOTE    Celine Dishman Tuggle-Ziglar  MWU:[id number] DOB: [birth year] DOA: 07/04/2018 PCP: Chipper Herb, MD   Brief Narrative:  58 year old female with history of COPD, diabetes mellitus type 2, DVT, GERD, hyperlipidemia, hypertension, hypothyroidism, obesity, PAD with prior toe amputations, depression presented on 07/04/2018 due to chronic nonhealing wound of right great toe with purulent drainage, seen by PCP and admitted for right great toe osteomyelitis.  Orthopedics and vascular surgery were consulted.   Assessment & Plan:   Principal Problem:   Osteomyelitis of great toe of right foot (HCC) Active Problems:   Diabetic infection of right foot (HCC)   Hyperlipidemia   SMOKER   Essential hypertension   GERD (gastroesophageal reflux disease)   Chronic obstructive pulmonary disease (HCC)   Obesity (BMI 30-39.9)   Hypothyroidism   Peripheral vascular insufficiency (HCC)   Deep vein thrombosis (DVT) of both lower extremities (HCC)   Intermittent claudication (HCC)   Mild protein-calorie malnutrition (HCC)   Right great toe osteomyelitis/diabetic foot infection/severe PAD -Vascular surgery and orthopedics following.  No options for revascularization currently.  Patient will need amputation probably on Friday.  Continue broad-spectrum antibiotics Zosyn and daptomycin.  Currently hemodynamically stable  Diabetes mellitus type 2 with peripheral neuropathy -Metformin on hold.  Continue Lantus with insulin sliding scale coverage.  A1c 8.8.  Essential hypertension -Continue losartan.  Monitor blood pressure  Hyperlipidemia Continue atorvastatin  Hypothyroidism -Continue Synthroid  GERD next-continue PPI  COPD -Stable.  No signs of bronchospasm  Tobacco abuse -Cessation counseled by prior hospitalist.  Obesity  -Outpatient follow-up  History of DVTs -Apparently patient has been on  Coumadin for the last 13 years.  She was also on aspirin and Plavix.  Currently antiplatelets and Coumadin on hold for possible surgery.   DVT prophylaxis: Lovenox Code Status:  Full  Family Communication: None at bedside Disposition Plan: Pending clinical improvement  Consultants: Orthopedic/vascular surgery  Procedures: None  Antimicrobials: Daptomycin and Zosyn   Subjective: Patient seen and examined at bedside.  Denies worsening pain.  No overnight fever, nausea or vomiting.  Objective: Vitals:   07/05/18 1226 07/05/18 2208 07/06/18 0603 07/06/18 1349  BP:  135/79 133/76 105/74  Pulse:  76 66 (!) 56  Resp:    18  Temp: 97.6 F (36.4 C) (!) 97.5 F (36.4 C) (!) 97.3 F (36.3 C) 97.6 F (36.4 C)  TempSrc: Oral Oral  Oral  SpO2:  94% 95% 99%  Weight:      Height:        Intake/Output Summary (Last 24 hours) at 07/06/2018 1510 Last data filed at 07/05/2018 1827 Gross per 24 hour  Intake 240 ml  Output -  Net 240 ml   Filed Weights   07/04/18 1853  Weight: 104.8 kg    Examination:  General exam: Appears calm and comfortable  Respiratory system: Bilateral decreased breath sounds at bases Cardiovascular system: S1 & S2 heard, intermittent bradycardia Gastrointestinal system: Abdomen is obese, nondistended, soft and nontender. Normal bowel sounds heard. Extremities: No cyanosis, clubbing, edema.  Tip of right great toe with dry gangrene.  Amputated right fourth and fifth toes.  Data Reviewed: I have personally reviewed following labs and imaging studies  CBC: Recent Labs  Lab 07/04/18 1209 07/04/18 1641  WBC 10.9* 11.1*  NEUTROABS 5.5 5.7  HGB 15.3* 14.7  HCT 48.8* 45.7  MCV 90.7 88.6  PLT 231 236  Basic Metabolic Panel: Recent Labs  Lab 07/04/18 1209 07/04/18 1641  NA 139 137  K 4.2 3.8  CL 98 98  CO2 26 28  GLUCOSE 203* 157*  BUN 11 12  CREATININE 0.78 0.75  CALCIUM 9.6 9.3   GFR: Estimated Creatinine Clearance: 104.1 mL/min (by C-G  formula based on SCr of 0.75 mg/dL). Liver Function Tests: Recent Labs  Lab 07/04/18 1209 07/04/18 1641  AST 20 20  ALT 22 19  ALKPHOS 57 48  BILITOT 0.3 0.5  PROT 7.8 7.2  ALBUMIN 3.5 3.2*   No results for input(s): LIPASE, AMYLASE in the last 168 hours. No results for input(s): AMMONIA in the last 168 hours. Coagulation Profile: Recent Labs  Lab 07/04/18 1209 07/04/18 1641  INR 1.52 1.54   Cardiac Enzymes: Recent Labs  Lab 07/05/18 0213  CKTOTAL 87   BNP (last 3 results) No results for input(s): PROBNP in the last 8760 hours. HbA1C: Recent Labs    07/04/18 1641  HGBA1C 8.8*   CBG: Recent Labs  Lab 07/05/18 1215 07/05/18 1734 07/05/18 2133 07/06/18 0832 07/06/18 1204  GLUCAP 138* 217* 241* 212* 157*   Lipid Profile: No results for input(s): CHOL, HDL, LDLCALC, TRIG, CHOLHDL, LDLDIRECT in the last 72 hours. Thyroid Function Tests: No results for input(s): TSH, T4TOTAL, FREET4, T3FREE, THYROIDAB in the last 72 hours. Anemia Panel: No results for input(s): VITAMINB12, FOLATE, FERRITIN, TIBC, IRON, RETICCTPCT in the last 72 hours. Sepsis Labs: Recent Labs  Lab 07/04/18 1209 07/04/18 1641 07/04/18 1701 07/04/18 2340  PROCALCITON  --  <0.10  --   --   LATICACIDVEN 5.7*  --  1.9 1.7    Recent Results (from the past 240 hour(s))  Culture, blood (Routine x 2)     Status: None (Preliminary result)   Collection Time: 07/04/18 12:09 PM  Result Value Ref Range Status   Specimen Description BLOOD LEFT HAND  Final   Special Requests   Final    BOTTLES DRAWN AEROBIC ONLY Blood Culture adequate volume   Culture   Final    NO GROWTH 2 DAYS Performed at [facility], [address], [place], [place] [address]    Report Status PENDING  Incomplete  Culture, blood (Routine x 2)     Status: None (Preliminary result)   Collection Time: 07/04/18  5:20 PM  Result Value Ref Range Status   Specimen Description BLOOD RIGHT ANTECUBITAL  Final   Special Requests    Final    BOTTLES DRAWN AEROBIC AND ANAEROBIC Blood Culture adequate volume   Culture   Final    NO GROWTH 2 DAYS Performed at [facility], [place] [address] [place], [place] [address]    Report Status PENDING  Incomplete         Radiology Studies: Vas [place] Abi With/wo Tbi  Result Date: 07/06/2018 LOWER EXTREMITY DOPPLER STUDY Indications: Gangrene, and PVD ; Establish baseline vascular status. High Risk Factors: Hypertension, hyperlipidemia, Diabetes. Other Factors: Cigerrate.  Vascular Interventions: Femoral bypass x5. Comparison Study: ABI/TBI on 04/24/2014 Performing Technologist: Rudell Cobb RDMS RVT  Examination Guidelines: A complete evaluation includes at minimum, Doppler waveform signals and systolic blood pressure reading at the level of bilateral brachial, anterior tibial, and posterior tibial arteries, when vessel segments are accessible. Bilateral testing is considered an integral part of a complete examination. Photoelectric Plethysmograph (PPG) waveforms and toe systolic pressure readings are included as required and additional duplex testing as needed. Limited examinations for reoccurring indications  may be performed as noted.  ABI Findings: +---------+------------------+-----+----------+-------------+ Right    Rt Pressure (mmHg)IndexWaveform  Comment       +---------+------------------+-----+----------+-------------+ Brachial 165                    triphasic               +---------+------------------+-----+----------+-------------+ ATA      71                0.43 monophasiccalf pressure +---------+------------------+-----+----------+-------------+ PTA      74                0.45 monophasic              +---------+------------------+-----+----------+-------------+ DP       81                0.49 monophasic              +---------+------------------+-----+----------+-------------+ Great Toe66                0.40                          +---------+------------------+-----+----------+-------------+ +---------+------------------+-----+-------------+------------------------+ Left     Lt Pressure (mmHg)IndexWaveform     Comment                  +---------+------------------+-----+-------------+------------------------+ Brachial 163                    triphasic                             +---------+------------------+-----+-------------+------------------------+ ATA      108               0.65 biphasic     Calf pressure            +---------+------------------+-----+-------------+------------------------+ PTA      136               0.82 monophasic                            +---------+------------------+-----+-------------+------------------------+ DP       99                0.60 monophasic                            +---------+------------------+-----+-------------+------------------------+ Great Toe52                0.32 Almost absentwaveform is very flatten +---------+------------------+-----+-------------+------------------------+ +-------+-----------+-----------+------------+------------+ ABI/TBIToday's ABIToday's TBIPrevious ABIPrevious TBI +-------+-----------+-----------+------------+------------+ Right  0.49       0.40       0.39        0.40         +-------+-----------+-----------+------------+------------+ Left   0.82       0.32       0.78        0.32         +-------+-----------+-----------+------------+------------+ Right Calf-Brachial Index=0.43 Left Calf-Brachial Index=0.65. Right ABIs and TBIs appear increased compared to prior study on 04/24/2014. Left ABIs appear decreased compared to prior study on 04/24/2014. Bilateral TBIs essentially unchanged compares to previous study on 2015  Summary: Right: Resting right ankle-brachial index indicates severe right lower extremity arterial disease. The right toe-brachial index is abnormal. Right Calf-Brachial Index=0.43  Left:  Resting left  ankle-brachial index indicates mild left lower extremity arterial disease. The left toe-brachial index is abnormal. Left Calf-Brachial Index=0.65  *See table(s) above for measurements and observations.  Electronically signed by Deitra Mayo MD on 07/06/2018 at 6:40:41 AM.    Final         Scheduled Meds: . aspirin  325 mg Oral Daily  . atorvastatin  40 mg Oral Daily  . cholecalciferol  5,000 Units Oral Daily  . enoxaparin (LOVENOX) injection  40 mg Subcutaneous Q24H  . escitalopram  30 mg Oral Daily  . furosemide  20 mg Oral Daily  . insulin aspart  0-15 Units Subcutaneous TID WC  . insulin aspart  0-5 Units Subcutaneous QHS  . insulin glargine  10 Units Subcutaneous QHS  . levothyroxine  100 mcg Oral Daily  . losartan  50 mg Oral Daily  . mupirocin ointment  1 application Topical BID  . pantoprazole  40 mg Oral Daily  . umeclidinium bromide  1 puff Inhalation Daily   Continuous Infusions: . DAPTOmycin (CUBICIN)  IV 600 mg (07/05/18 2156)  . piperacillin-tazobactam (ZOSYN)  IV 3.375 g (07/06/18 1447)     LOS: 2 days        Aline August, MD [facility] Pager [phone]  If 7PM-7AM, please contact night-coverage [email or web address] Password [email or web address] 07/06/2018, 3:10 PM

## 2018-07-06 NOTE — Progress Notes (Signed)
Patient ID: NUR RABOLD, female   DOB: 1961-04-07, 58 y.o.   MRN: [id number] Patient seen in follow-up for her severe peripheral vascular disease as well as osteomyelitis of the right great toe.  Patient has had further vascular work-up and does not have any further revascularization options.  Discussed  her options of a transtibial amputation versus above-knee amputation.  Discussed that while she is at increased risk of wound healing of the below the knee amputation that she has a better chance of ambulating with a prosthesis with a below the knee amputations and above-knee amputation.  Patient states she understands wished to proceed with surgery we will plan for a right transtibial amputation on Friday.  Patient has a medical care at home and I will put in orders for inpatient rehab this would be ideal for her.  Again discussed the importance of smoking cessation for wound healing the importance of  Whey based protein powder supplements.

## 2018-07-06 NOTE — Telephone Encounter (Signed)
FYI for providers. Having a leg amputated.

## 2018-07-06 NOTE — Progress Notes (Signed)
   VASCULAR SURGERY ASSESSMENT & PLAN:   NONHEALING WOUND RIGHT FOOT WITH INFRAINGUINAL ARTERIAL OCCLUSIVE DISEASE: This patient has no further options for revascularization on the right as per Dr. Luther Parody previous notes.  She is agreeable to proceed with primary amputation.  Her below-knee pressure is 71 mmHg which suggest that she likely has adequate circulation to heal a below the knee amputation however I think there is a 10% chance of nonhealing.  I have discussed the case with Dr. Sharol Given.  He will discuss this with her and I believe he plans on proceeding with surgery Friday.  SUBJECTIVE:   No complaints this morning.  PHYSICAL EXAM:   Vitals:   07/05/18 0507 07/05/18 1226 07/05/18 2208 07/06/18 0603  BP: (!) 155/85  135/79 133/76  Pulse: 62  76 66  Resp: 12     Temp: (!) 97.2 F (36.2 C) 97.6 F (36.4 C) (!) 97.5 F (36.4 C) (!) 97.3 F (36.3 C)  TempSrc: Oral Oral Oral   SpO2: 96%  94% 95%  Weight:      Height:       No change in wound on right foot.  LABS:   Lab Results  Component Value Date   WBC 11.1 (H) 07/04/2018   HGB 14.7 07/04/2018   HCT 45.7 07/04/2018   MCV 88.6 07/04/2018   PLT 236 07/04/2018   Lab Results  Component Value Date   CREATININE 0.75 07/04/2018   Lab Results  Component Value Date   INR 1.54 07/04/2018   CBG (last 3)  Recent Labs    07/05/18 1215 07/05/18 1734 07/05/18 2133  GLUCAP 138* 217* 241*    PROBLEM LIST:    Principal Problem:   Osteomyelitis of great toe of right foot (HCC) Active Problems:   Diabetic infection of right foot (HCC)   Hyperlipidemia   SMOKER   Essential hypertension   GERD (gastroesophageal reflux disease)   Chronic obstructive pulmonary disease (HCC)   Obesity (BMI 30-39.9)   Hypothyroidism   Peripheral vascular insufficiency (HCC)   Deep vein thrombosis (DVT) of both lower extremities (HCC)   Intermittent claudication (HCC)   Mild protein-calorie malnutrition (HCC)   CURRENT MEDS:   .  aspirin  325 mg Oral Daily  . atorvastatin  40 mg Oral Daily  . cholecalciferol  5,000 Units Oral Daily  . enoxaparin (LOVENOX) injection  40 mg Subcutaneous Q24H  . escitalopram  30 mg Oral Daily  . furosemide  20 mg Oral Daily  . insulin aspart  0-15 Units Subcutaneous TID WC  . insulin aspart  0-5 Units Subcutaneous QHS  . insulin glargine  10 Units Subcutaneous QHS  . levothyroxine  100 mcg Oral Daily  . losartan  50 mg Oral Daily  . mupirocin ointment  1 application Topical BID  . pantoprazole  40 mg Oral Daily  . umeclidinium bromide  1 puff Inhalation Daily    Deitra Mayo Beeper: [phone] Office: [phone] 07/06/2018

## 2018-07-07 ENCOUNTER — Ambulatory Visit (INDEPENDENT_AMBULATORY_CARE_PROVIDER_SITE_OTHER): Payer: Self-pay | Admitting: Physician Assistant

## 2018-07-07 DIAGNOSIS — J449 Chronic obstructive pulmonary disease, unspecified: Secondary | ICD-10-CM

## 2018-07-07 LAB — GLUCOSE, CAPILLARY
GLUCOSE-CAPILLARY: 185 mg/dL — AB (ref 70–99)
Glucose-Capillary: 199 mg/dL — ABNORMAL HIGH (ref 70–99)
Glucose-Capillary: 331 mg/dL — ABNORMAL HIGH (ref 70–99)
Glucose-Capillary: 342 mg/dL — ABNORMAL HIGH (ref 70–99)
Glucose-Capillary: 192 mg/dL — ABNORMAL HIGH (ref 70–99)

## 2018-07-07 MED ORDER — INSULIN GLARGINE 100 UNIT/ML ~~LOC~~ SOLN
15.0000 [IU] | Freq: Every day | SUBCUTANEOUS | Status: DC
  Administered 2018-07-07: 15 [IU] via SUBCUTANEOUS
  Filled 2018-07-07: qty 0.15

## 2018-07-07 NOTE — Progress Notes (Signed)
Patient ID: [name], female   DOB: 20-Oct-1960, 58 y.o.   MRN: [id number] Long-term patient of mine with bypass 17 years ago according to the patient.  Seen by Dr. Scot Dock.  Having progressive pain with a gangrenous changes on the tip of her right toe.  Very low chance of healing toe amputation.  Plan for below-knee amputation tomorrow with Dr. Sharol Given.

## 2018-07-07 NOTE — Progress Notes (Addendum)
OT Cancellation Note  Patient Details Name: [name] MRN: [id number] DOB: 10/17/1960   Cancelled Treatment:    Reason Eval/Treat Not Completed: Medical issues which prohibited therapy;Other (comment). Pt scheduled for R BKA Friday 07/08/2018, please re order OT when appropriate  Britt Bottom 07/07/2018, 10:40 AM

## 2018-07-07 NOTE — Care Management Important Message (Signed)
Important Message  Patient Details  Name: [name] MRN: [id number] Date of Birth: 10-18-1960   Medicare Important Message Given:  Yes    Annemarie Sebree 07/07/2018, 4:04 PM

## 2018-07-07 NOTE — Progress Notes (Signed)
Patient ID: [name], female   DOB: Aug 30, 1960, 57 y.o.   MRN: [id number]  PROGRESS NOTE    Nickole Adamek Tuggle-Ziglar  BPZ:[id number] DOB: 1961-02-13 DOA: 07/04/2018 PCP: Chipper Herb, MD   Brief Narrative:  58 year old female with history of COPD, diabetes mellitus type 2, DVT, GERD, hyperlipidemia, hypertension, hypothyroidism, obesity, PAD with prior toe amputations, depression presented on 07/04/2018 due to chronic nonhealing wound of right great toe with purulent drainage, seen by PCP and admitted for right great toe osteomyelitis.  Orthopedics and vascular surgery were consulted.   Assessment & Plan:   Principal Problem:   Osteomyelitis of great toe of right foot (HCC) Active Problems:   Diabetic infection of right foot (HCC)   Hyperlipidemia   SMOKER   Essential hypertension   GERD (gastroesophageal reflux disease)   Chronic obstructive pulmonary disease (HCC)   Obesity (BMI 30-39.9)   Hypothyroidism   Peripheral vascular insufficiency (HCC)   Deep vein thrombosis (DVT) of both lower extremities (HCC)   Intermittent claudication (HCC)   Mild protein-calorie malnutrition (HCC)   Right great toe osteomyelitis/diabetic foot infection/severe PAD -Vascular surgery and orthopedics following.  No options for revascularization currently.  Patient will need amputation probably on Friday.  We will keep n.p.o. after midnight for tomorrow morning.  Continue broad-spectrum antibiotics Zosyn and daptomycin.  Currently hemodynamically stable  Diabetes mellitus type 2 with peripheral neuropathy with hyperglycemia -Metformin on hold.  Increase Lantus to 15 units daily, continue insulin sliding scale coverage.  A1c 8.8.  Essential hypertension -Continue losartan.  Monitor blood pressure  Hyperlipidemia Continue atorvastatin  Hypothyroidism -Continue Synthroid  GERD  -continue PPI  COPD -Stable.  No signs of bronchospasm  Tobacco abuse -Cessation counseled by prior  hospitalist.  Obesity  -Outpatient follow-up  History of DVTs -Apparently patient has been on Coumadin for the last 13 years.  She was also on aspirin and Plavix.  Currently antiplatelets and Coumadin on hold for possible surgery.   DVT prophylaxis: Lovenox Code Status:  Full  Family Communication: None at bedside Disposition Plan: Pending clinical improvement  Consultants: Orthopedic/vascular surgery  Procedures: None  Antimicrobials: Daptomycin and Zosyn   Subjective: Patient seen and examined at bedside.  No overnight fever, nausea or vomiting.  No worsening pain. Objective: Vitals:   07/06/18 0603 07/06/18 1349 07/06/18 2214 07/07/18 0511  BP: 133/76 105/74 138/82 138/82  Pulse: 66 (!) 56 75 69  Resp:  18 12 12   Temp: (!) 97.3 F (36.3 C) 97.6 F (36.4 C) (!) 97.5 F (36.4 C) 98 F (36.7 C)  TempSrc:  Oral Oral Oral  SpO2: 95% 99% 96% 99%  Weight:      Height:        Intake/Output Summary (Last 24 hours) at 07/07/2018 0914 Last data filed at 07/06/2018 1514 Gross per 24 hour  Intake 100 ml  Output -  Net 100 ml   Filed Weights   07/04/18 1853  Weight: 104.8 kg    Examination:  General exam: No distress Respiratory system: Bilateral decreased breath sounds at bases, no wheezing Cardiovascular system: S1 & S2 heard, rate controlled Gastrointestinal system: Abdomen is obese, nondistended, soft and nontender. Normal bowel sounds heard. Extremities: No cyanosis, edema.  Tip of right great toe with dry gangrene.  Amputated right fourth and fifth toes.  Data Reviewed: I have personally reviewed following labs and imaging studies  CBC: Recent Labs  Lab 07/04/18 1209 07/04/18 1641  WBC 10.9* 11.1*  NEUTROABS 5.5 5.7  HGB  15.3* 14.7  HCT 48.8* 45.7  MCV 90.7 88.6  PLT 231 233   Basic Metabolic Panel: Recent Labs  Lab 07/04/18 1209 07/04/18 1641  NA 139 137  K 4.2 3.8  CL 98 98  CO2 26 28  GLUCOSE 203* 157*  BUN 11 12  CREATININE 0.78 0.75    CALCIUM 9.6 9.3   GFR: Estimated Creatinine Clearance: 104.1 mL/min (by C-G formula based on SCr of 0.75 mg/dL). Liver Function Tests: Recent Labs  Lab 07/04/18 1209 07/04/18 1641  AST 20 20  ALT 22 19  ALKPHOS 57 48  BILITOT 0.3 0.5  PROT 7.8 7.2  ALBUMIN 3.5 3.2*   No results for input(s): LIPASE, AMYLASE in the last 168 hours. No results for input(s): AMMONIA in the last 168 hours. Coagulation Profile: Recent Labs  Lab 07/04/18 1209 07/04/18 1641  INR 1.52 1.54   Cardiac Enzymes: Recent Labs  Lab 07/05/18 0213  CKTOTAL 87   BNP (last 3 results) No results for input(s): PROBNP in the last 8760 hours. HbA1C: Recent Labs    07/04/18 1641  HGBA1C 8.8*   CBG: Recent Labs  Lab 07/06/18 0832 07/06/18 1204 07/06/18 1712 07/06/18 2211 07/07/18 0816  GLUCAP 212* 157* 238* 186* 199*   Lipid Profile: No results for input(s): CHOL, HDL, LDLCALC, TRIG, CHOLHDL, LDLDIRECT in the last 72 hours. Thyroid Function Tests: No results for input(s): TSH, T4TOTAL, FREET4, T3FREE, THYROIDAB in the last 72 hours. Anemia Panel: No results for input(s): VITAMINB12, FOLATE, FERRITIN, TIBC, IRON, RETICCTPCT in the last 72 hours. Sepsis Labs: Recent Labs  Lab 07/04/18 1209 07/04/18 1641 07/04/18 1701 07/04/18 2340  PROCALCITON  --  <0.10  --   --   LATICACIDVEN 5.7*  --  1.9 1.7    Recent Results (from the past 240 hour(s))  Culture, blood (Routine x 2)     Status: None (Preliminary result)   Collection Time: 07/04/18 12:09 PM  Result Value Ref Range Status   Specimen Description BLOOD LEFT HAND  Final   Special Requests   Final    BOTTLES DRAWN AEROBIC ONLY Blood Culture adequate volume   Culture   Final    NO GROWTH 2 DAYS Performed at [facility], [address], [place], [place] [address]    Report Status PENDING  Incomplete  Culture, blood (Routine x 2)     Status: None (Preliminary result)   Collection Time: 07/04/18  5:20 PM  Result Value Ref Range  Status   Specimen Description BLOOD RIGHT ANTECUBITAL  Final   Special Requests   Final    BOTTLES DRAWN AEROBIC AND ANAEROBIC Blood Culture adequate volume   Culture   Final    NO GROWTH 2 DAYS Performed at [facility], [place] [address] [place], [place] [address]    Report Status PENDING  Incomplete         Radiology Studies: Vas [place] Abi With/wo Tbi  Result Date: 07/06/2018 LOWER EXTREMITY DOPPLER STUDY Indications: Gangrene, and PVD ; Establish baseline vascular status. High Risk Factors: Hypertension, hyperlipidemia, Diabetes. Other Factors: Cigerrate.  Vascular Interventions: Femoral bypass x5. Comparison Study: ABI/TBI on 04/24/2014 Performing Technologist: Rudell Cobb RDMS RVT  Examination Guidelines: A complete evaluation includes at minimum, Doppler waveform signals and systolic blood pressure reading at the level of bilateral brachial, anterior tibial, and posterior tibial arteries, when vessel segments are accessible. Bilateral testing is considered an integral part of a complete examination. Photoelectric Plethysmograph (PPG) waveforms and toe systolic  pressure readings are included as required and additional duplex testing as needed. Limited examinations for reoccurring indications may be performed as noted.  ABI Findings: +---------+------------------+-----+----------+-------------+ Right    Rt Pressure (mmHg)IndexWaveform  Comment       +---------+------------------+-----+----------+-------------+ Brachial 165                    triphasic               +---------+------------------+-----+----------+-------------+ ATA      71                0.43 monophasiccalf pressure +---------+------------------+-----+----------+-------------+ PTA      74                0.45 monophasic              +---------+------------------+-----+----------+-------------+ DP       81                0.49 monophasic               +---------+------------------+-----+----------+-------------+ Great Toe66                0.40                         +---------+------------------+-----+----------+-------------+ +---------+------------------+-----+-------------+------------------------+ Left     Lt Pressure (mmHg)IndexWaveform     Comment                  +---------+------------------+-----+-------------+------------------------+ Brachial 163                    triphasic                             +---------+------------------+-----+-------------+------------------------+ ATA      108               0.65 biphasic     Calf pressure            +---------+------------------+-----+-------------+------------------------+ PTA      136               0.82 monophasic                            +---------+------------------+-----+-------------+------------------------+ DP       99                0.60 monophasic                            +---------+------------------+-----+-------------+------------------------+ Great Toe52                0.32 Almost absentwaveform is very flatten +---------+------------------+-----+-------------+------------------------+ +-------+-----------+-----------+------------+------------+ ABI/TBIToday's ABIToday's TBIPrevious ABIPrevious TBI +-------+-----------+-----------+------------+------------+ Right  0.49       0.40       0.39        0.40         +-------+-----------+-----------+------------+------------+ Left   0.82       0.32       0.78        0.32         +-------+-----------+-----------+------------+------------+ Right Calf-Brachial Index=0.43 Left Calf-Brachial Index=0.65. Right ABIs and TBIs appear increased compared to prior study on 04/24/2014. Left ABIs appear decreased compared to prior study on 04/24/2014. Bilateral TBIs essentially unchanged compares to previous study on 2015  Summary: Right: Resting right ankle-brachial index indicates  severe right  lower extremity arterial disease. The right toe-brachial index is abnormal. Right Calf-Brachial Index=0.43  Left: Resting left ankle-brachial index indicates mild left lower extremity arterial disease. The left toe-brachial index is abnormal. Left Calf-Brachial Index=0.65  *See table(s) above for measurements and observations.  Electronically signed by Deitra Mayo MD on 07/06/2018 at 6:40:41 AM.    Final         Scheduled Meds: . atorvastatin  40 mg Oral Daily  . cholecalciferol  5,000 Units Oral Daily  . enoxaparin (LOVENOX) injection  40 mg Subcutaneous Q24H  . escitalopram  30 mg Oral Daily  . furosemide  20 mg Oral Daily  . insulin aspart  0-15 Units Subcutaneous TID WC  . insulin aspart  0-5 Units Subcutaneous QHS  . insulin glargine  10 Units Subcutaneous QHS  . levothyroxine  100 mcg Oral Daily  . losartan  50 mg Oral Daily  . mupirocin ointment  1 application Topical BID  . nicotine  21 mg Transdermal Daily  . pantoprazole  40 mg Oral Daily  . umeclidinium bromide  1 puff Inhalation Daily   Continuous Infusions: . DAPTOmycin (CUBICIN)  IV 600 mg (07/06/18 2204)  . piperacillin-tazobactam (ZOSYN)  IV 3.375 g (07/07/18 0541)     LOS: 3 days        Aline August, MD [facility] Pager [phone]  If 7PM-7AM, please contact night-coverage [email or web address] Password [facility] 07/07/2018, 9:14 AM

## 2018-07-08 ENCOUNTER — Inpatient Hospital Stay (HOSPITAL_COMMUNITY): Payer: Medicare Other | Admitting: Anesthesiology

## 2018-07-08 ENCOUNTER — Other Ambulatory Visit: Payer: Self-pay

## 2018-07-08 ENCOUNTER — Encounter (HOSPITAL_COMMUNITY)
Admission: EM | Disposition: A | Discharge status: Rehabilitation Facility | Payer: Self-pay | Source: Home / Self Care | Attending: Internal Medicine

## 2018-07-08 DIAGNOSIS — M869 Osteomyelitis, unspecified: Secondary | ICD-10-CM | POA: Diagnosis not present

## 2018-07-08 DIAGNOSIS — Z89511 Acquired absence of right leg below knee: Secondary | ICD-10-CM

## 2018-07-08 DIAGNOSIS — E1151 Type 2 diabetes mellitus with diabetic peripheral angiopathy without gangrene: Secondary | ICD-10-CM | POA: Diagnosis not present

## 2018-07-08 DIAGNOSIS — E1169 Type 2 diabetes mellitus with other specified complication: Secondary | ICD-10-CM | POA: Diagnosis not present

## 2018-07-08 HISTORY — PX: AMPUTATION: SHX166

## 2018-07-08 LAB — CBC WITH DIFFERENTIAL/PLATELET
Abs Immature Granulocytes: 0.05 10*3/uL (ref 0.00–0.07)
Basophils Absolute: 0.1 10*3/uL (ref 0.0–0.1)
Basophils Relative: 1 %
Eosinophils Absolute: 0.2 10*3/uL (ref 0.0–0.5)
Eosinophils Relative: 2 %
HCT: 44.2 % (ref 36.0–46.0)
Hemoglobin: 14.6 g/dL (ref 12.0–15.0)
Immature Granulocytes: 1 %
LYMPHS ABS: 4.4 10*3/uL — AB (ref 0.7–4.0)
LYMPHS PCT: 41 %
MCH: 29.3 pg (ref 26.0–34.0)
MCHC: 33 g/dL (ref 30.0–36.0)
MCV: 88.8 fL (ref 80.0–100.0)
Monocytes Absolute: 0.9 10*3/uL (ref 0.1–1.0)
Monocytes Relative: 9 %
Neutro Abs: 5.1 10*3/uL (ref 1.7–7.7)
Neutrophils Relative %: 46 %
Platelets: 205 10*3/uL (ref 150–400)
RBC: 4.98 MIL/uL (ref 3.87–5.11)
RDW: 13.8 % (ref 11.5–15.5)
WBC: 10.7 10*3/uL — ABNORMAL HIGH (ref 4.0–10.5)
nRBC: 0 % (ref 0.0–0.2)

## 2018-07-08 LAB — BASIC METABOLIC PANEL
Anion gap: 8 (ref 5–15)
BUN: 16 mg/dL (ref 6–20)
CO2: 27 mmol/L (ref 22–32)
Calcium: 9 mg/dL (ref 8.9–10.3)
Chloride: 108 mmol/L (ref 98–111)
Creatinine, Ser: 0.97 mg/dL (ref 0.44–1.00)
GFR calc Af Amer: >60 mL/min (ref >60)
GFR calc non Af Amer: >60 mL/min (ref >60)
Glucose, Bld: 223 mg/dL — ABNORMAL HIGH (ref 70–99)
Potassium: 4.4 mmol/L (ref 3.5–5.1)
Sodium: 143 mmol/L (ref 135–145)

## 2018-07-08 LAB — SURGICAL PCR SCREEN
MRSA, PCR: NEGATIVE
STAPHYLOCOCCUS AUREUS: NEGATIVE

## 2018-07-08 LAB — GLUCOSE, CAPILLARY
Glucose-Capillary: 217 mg/dL — ABNORMAL HIGH (ref 70–99)
Glucose-Capillary: 194 mg/dL — ABNORMAL HIGH (ref 70–99)
Glucose-Capillary: 157 mg/dL — ABNORMAL HIGH (ref 70–99)
Glucose-Capillary: 155 mg/dL — ABNORMAL HIGH (ref 70–99)
Glucose-Capillary: 168 mg/dL — ABNORMAL HIGH (ref 70–99)
Glucose-Capillary: 175 mg/dL — ABNORMAL HIGH (ref 70–99)

## 2018-07-08 LAB — MAGNESIUM: Magnesium: 1.7 mg/dL (ref 1.7–2.4)

## 2018-07-08 SURGERY — AMPUTATION BELOW KNEE
Anesthesia: General | Laterality: Right

## 2018-07-08 MED ORDER — MIDAZOLAM HCL 2 MG/2ML IJ SOLN
INTRAMUSCULAR | Status: DC | PRN
  Administered 2018-07-08: 2 mg via INTRAVENOUS

## 2018-07-08 MED ORDER — ACETAMINOPHEN 500 MG PO TABS
500.0000 mg | ORAL_TABLET | Freq: Four times a day (QID) | ORAL | Status: AC
  Administered 2018-07-08 – 2018-07-09 (×4): 500 mg via ORAL
  Filled 2018-07-08 (×3): qty 1

## 2018-07-08 MED ORDER — LABETALOL HCL 5 MG/ML IV SOLN
INTRAVENOUS | Status: AC
  Filled 2018-07-08: qty 4

## 2018-07-08 MED ORDER — SUCCINYLCHOLINE CHLORIDE 200 MG/10ML IV SOSY
PREFILLED_SYRINGE | INTRAVENOUS | Status: DC | PRN
  Administered 2018-07-08: 160 mg via INTRAVENOUS

## 2018-07-08 MED ORDER — FENTANYL CITRATE (PF) 100 MCG/2ML IJ SOLN
INTRAMUSCULAR | Status: AC
  Administered 2018-07-08: 50 ug via INTRAVENOUS
  Filled 2018-07-08: qty 2

## 2018-07-08 MED ORDER — INSULIN GLARGINE 100 UNIT/ML ~~LOC~~ SOLN
20.0000 [IU] | Freq: Every day | SUBCUTANEOUS | Status: DC
  Administered 2018-07-08 – 2018-07-10 (×3): 20 [IU] via SUBCUTANEOUS
  Filled 2018-07-08 (×4): qty 0.2

## 2018-07-08 MED ORDER — FENTANYL CITRATE (PF) 100 MCG/2ML IJ SOLN
25.0000 ug | INTRAMUSCULAR | Status: DC | PRN
  Administered 2018-07-08 (×2): 50 ug via INTRAVENOUS

## 2018-07-08 MED ORDER — DOCUSATE SODIUM 100 MG PO CAPS
100.0000 mg | ORAL_CAPSULE | Freq: Two times a day (BID) | ORAL | Status: DC
  Administered 2018-07-08 – 2018-07-11 (×6): 100 mg via ORAL
  Filled 2018-07-08 (×7): qty 1

## 2018-07-08 MED ORDER — HYDROCODONE-ACETAMINOPHEN 7.5-325 MG PO TABS
1.0000 | ORAL_TABLET | ORAL | Status: DC | PRN
  Administered 2018-07-08: 1 via ORAL
  Filled 2018-07-08: qty 1

## 2018-07-08 MED ORDER — ONDANSETRON HCL 4 MG PO TABS: 4.0000 mg | ORAL_TABLET | Freq: Four times a day (QID) | ORAL | Status: DC | PRN

## 2018-07-08 MED ORDER — SUGAMMADEX SODIUM 200 MG/2ML IV SOLN
INTRAVENOUS | Status: DC | PRN
  Administered 2018-07-08: 200 mg via INTRAVENOUS

## 2018-07-08 MED ORDER — FENTANYL CITRATE (PF) 250 MCG/5ML IJ SOLN
INTRAMUSCULAR | Status: AC
  Filled 2018-07-08: qty 5

## 2018-07-08 MED ORDER — ENOXAPARIN SODIUM 40 MG/0.4ML ~~LOC~~ SOLN
40.0000 mg | SUBCUTANEOUS | Status: DC
  Administered 2018-07-09 – 2018-07-11 (×3): 40 mg via SUBCUTANEOUS
  Filled 2018-07-08 (×3): qty 0.4

## 2018-07-08 MED ORDER — METOCLOPRAMIDE HCL 5 MG/ML IJ SOLN: 5.0000 mg | Freq: Three times a day (TID) | INTRAMUSCULAR | Status: DC | PRN

## 2018-07-08 MED ORDER — PHENYLEPHRINE 40 MCG/ML (10ML) SYRINGE FOR IV PUSH (FOR BLOOD PRESSURE SUPPORT)
PREFILLED_SYRINGE | INTRAVENOUS | Status: DC | PRN
  Administered 2018-07-08: 120 ug via INTRAVENOUS

## 2018-07-08 MED ORDER — FENTANYL CITRATE (PF) 100 MCG/2ML IJ SOLN
INTRAMUSCULAR | Status: DC | PRN
  Administered 2018-07-08 (×3): 50 ug via INTRAVENOUS

## 2018-07-08 MED ORDER — HYDROMORPHONE HCL 1 MG/ML IJ SOLN
0.5000 mg | INTRAMUSCULAR | Status: DC | PRN
  Administered 2018-07-08 – 2018-07-10 (×17): 0.5 mg via INTRAVENOUS
  Filled 2018-07-08 (×19): qty 0.5

## 2018-07-08 MED ORDER — ROCURONIUM BROMIDE 50 MG/5ML IV SOSY
PREFILLED_SYRINGE | INTRAVENOUS | Status: AC
  Filled 2018-07-08: qty 10

## 2018-07-08 MED ORDER — ONDANSETRON HCL 4 MG/2ML IJ SOLN
INTRAMUSCULAR | Status: DC | PRN
  Administered 2018-07-08: 4 mg via INTRAVENOUS

## 2018-07-08 MED ORDER — PROPOFOL 10 MG/ML IV BOLUS
INTRAVENOUS | Status: AC
  Filled 2018-07-08: qty 40

## 2018-07-08 MED ORDER — METOCLOPRAMIDE HCL 10 MG PO TABS: 5.0000 mg | ORAL_TABLET | Freq: Three times a day (TID) | ORAL | Status: DC | PRN

## 2018-07-08 MED ORDER — ROCURONIUM BROMIDE 100 MG/10ML IV SOLN
INTRAVENOUS | Status: DC | PRN
  Administered 2018-07-08: 30 mg via INTRAVENOUS

## 2018-07-08 MED ORDER — ONDANSETRON HCL 4 MG/2ML IJ SOLN
INTRAMUSCULAR | Status: AC
  Filled 2018-07-08: qty 2

## 2018-07-08 MED ORDER — CEFAZOLIN SODIUM-DEXTROSE 2-4 GM/100ML-% IV SOLN: 2.0000 g | INTRAVENOUS | Status: DC

## 2018-07-08 MED ORDER — FENTANYL CITRATE (PF) 100 MCG/2ML IJ SOLN: 25.0000 ug | Freq: Once | INTRAMUSCULAR | Status: DC

## 2018-07-08 MED ORDER — ONDANSETRON HCL 4 MG/2ML IJ SOLN: 4.0000 mg | Freq: Four times a day (QID) | INTRAMUSCULAR | Status: DC | PRN

## 2018-07-08 MED ORDER — PROPOFOL 10 MG/ML IV BOLUS
INTRAVENOUS | Status: DC | PRN
  Administered 2018-07-08: 170 mg via INTRAVENOUS

## 2018-07-08 MED ORDER — ACETAMINOPHEN 325 MG PO TABS: 325.0000 mg | ORAL_TABLET | Freq: Four times a day (QID) | ORAL | Status: DC | PRN

## 2018-07-08 MED ORDER — MORPHINE SULFATE (PF) 2 MG/ML IV SOLN
0.5000 mg | INTRAVENOUS | Status: DC | PRN
  Administered 2018-07-08 (×2): 1 mg via INTRAVENOUS
  Filled 2018-07-08 (×2): qty 1

## 2018-07-08 MED ORDER — SUCCINYLCHOLINE CHLORIDE 200 MG/10ML IV SOSY
PREFILLED_SYRINGE | INTRAVENOUS | Status: AC
  Filled 2018-07-08: qty 10

## 2018-07-08 MED ORDER — HYDROCODONE-ACETAMINOPHEN 5-325 MG PO TABS
1.0000 | ORAL_TABLET | ORAL | Status: DC | PRN
  Administered 2018-07-08: 2 via ORAL
  Filled 2018-07-08: qty 2

## 2018-07-08 MED ORDER — LABETALOL HCL 5 MG/ML IV SOLN
INTRAVENOUS | Status: DC | PRN
  Administered 2018-07-08 (×2): 5 mg via INTRAVENOUS

## 2018-07-08 MED ORDER — MIDAZOLAM HCL 2 MG/2ML IJ SOLN
INTRAMUSCULAR | Status: AC
  Filled 2018-07-08: qty 2

## 2018-07-08 MED ORDER — LIDOCAINE 2% (20 MG/ML) 5 ML SYRINGE
INTRAMUSCULAR | Status: DC | PRN
  Administered 2018-07-08: 60 mg via INTRAVENOUS

## 2018-07-08 MED ORDER — LACTATED RINGERS IV SOLN
INTRAVENOUS | Status: DC
  Administered 2018-07-08 – 2018-07-09 (×2): via INTRAVENOUS

## 2018-07-08 MED ORDER — CHLORHEXIDINE GLUCONATE 4 % EX LIQD: 60.0000 mL | Freq: Once | CUTANEOUS | Status: DC

## 2018-07-08 SURGICAL SUPPLY — 46 items
APL SKNCLS STERI-STRIP NONHPOA (GAUZE/BANDAGES/DRESSINGS) ×4
BENZOIN TINCTURE PRP APPL 2/3 (GAUZE/BANDAGES/DRESSINGS) ×12 IMPLANT
BLADE SAW RECIP 87.9 MT (BLADE) ×3 IMPLANT
BLADE SURG 21 STRL SS (BLADE) ×3 IMPLANT
BNDG COHESIVE 6X5 TAN STRL LF (GAUZE/BANDAGES/DRESSINGS) ×6 IMPLANT
BNDG GAUZE ELAST 4 BULKY (GAUZE/BANDAGES/DRESSINGS) ×6 IMPLANT
CANISTER WOUNDNEG PRESSURE 500 (CANNISTER) ×2 IMPLANT
COVER SURGICAL LIGHT HANDLE (MISCELLANEOUS) ×3 IMPLANT
COVER WAND RF STERILE (DRAPES) ×3 IMPLANT
CUFF TOURNIQUET SINGLE 34IN LL (TOURNIQUET CUFF) IMPLANT
CUFF TOURNIQUET SINGLE 44IN (TOURNIQUET CUFF) IMPLANT
DRAPE INCISE IOBAN 66X45 STRL (DRAPES) IMPLANT
DRAPE U-SHAPE 47X51 STRL (DRAPES) ×3 IMPLANT
DRESSING PREVENA PLUS CUSTOM (GAUZE/BANDAGES/DRESSINGS) ×1 IMPLANT
DRSG PREVENA PLUS CUSTOM (GAUZE/BANDAGES/DRESSINGS) ×6
DURAPREP 26ML APPLICATOR (WOUND CARE) ×3 IMPLANT
ELECT REM PT RETURN 9FT ADLT (ELECTROSURGICAL) ×3
ELECTRODE REM PT RTRN 9FT ADLT (ELECTROSURGICAL) ×1 IMPLANT
GLOVE BIO SURGEON STRL SZ 6.5 (GLOVE) ×1 IMPLANT
GLOVE BIO SURGEONS STRL SZ 6.5 (GLOVE) ×1
GLOVE BIOGEL PI IND STRL 7.0 (GLOVE) IMPLANT
GLOVE BIOGEL PI IND STRL 7.5 (GLOVE) IMPLANT
GLOVE BIOGEL PI IND STRL 9 (GLOVE) ×1 IMPLANT
GLOVE BIOGEL PI INDICATOR 7.0 (GLOVE) ×2
GLOVE BIOGEL PI INDICATOR 7.5 (GLOVE) ×2
GLOVE BIOGEL PI INDICATOR 9 (GLOVE) ×2
GLOVE SURG ORTHO 9.0 STRL STRW (GLOVE) ×3 IMPLANT
GOWN STRL REUS W/ TWL XL LVL3 (GOWN DISPOSABLE) ×2 IMPLANT
GOWN STRL REUS W/TWL XL LVL3 (GOWN DISPOSABLE) ×6
KIT BASIN OR (CUSTOM PROCEDURE TRAY) ×3 IMPLANT
KIT TURNOVER KIT B (KITS) ×3 IMPLANT
MANIFOLD NEPTUNE II (INSTRUMENTS) ×3 IMPLANT
NS IRRIG 1000ML POUR BTL (IV SOLUTION) ×3 IMPLANT
PACK ORTHO EXTREMITY (CUSTOM PROCEDURE TRAY) ×3 IMPLANT
PAD ARMBOARD 7.5X6 YLW CONV (MISCELLANEOUS) ×3 IMPLANT
PREVENA RESTOR ARTHOFORM 46X30 (CANNISTER) ×2 IMPLANT
SPONGE LAP 18X18 X RAY DECT (DISPOSABLE) IMPLANT
STAPLER VISISTAT 35W (STAPLE) IMPLANT
STOCKINETTE IMPERVIOUS LG (DRAPES) ×3 IMPLANT
SUT SILK 2 0 (SUTURE) ×3
SUT SILK 2-0 18XBRD TIE 12 (SUTURE) ×1 IMPLANT
SUT VIC AB 1 CTX 27 (SUTURE) IMPLANT
TOWEL OR 17X26 10 PK STRL BLUE (TOWEL DISPOSABLE) ×3 IMPLANT
TUBE CONNECTING 12'X1/4 (SUCTIONS) ×1
TUBE CONNECTING 12X1/4 (SUCTIONS) ×2 IMPLANT
YANKAUER SUCT BULB TIP NO VENT (SUCTIONS) ×3 IMPLANT

## 2018-07-08 NOTE — Anesthesia Postprocedure Evaluation (Signed)
Anesthesia Post Note  Patient: [name]  Procedure(s) Performed: RIGHT BELOW KNEE AMPUTATION (Right )     Patient location during evaluation: PACU Anesthesia Type: General Level of consciousness: awake Pain management: pain level controlled Vital Signs Assessment: post-procedure vital signs reviewed and stable Respiratory status: spontaneous breathing Cardiovascular status: stable Postop Assessment: no apparent nausea or vomiting Anesthetic complications: no    Last Vitals:  Vitals:   07/08/18 1245 07/08/18 1302  BP:    Pulse:    Resp:    Temp: 36.5 C (!) 36.4 C  SpO2:      Last Pain:  Vitals:   07/08/18 1302  TempSrc: Oral  PainSc:                  Woodson Macha

## 2018-07-08 NOTE — Anesthesia Preprocedure Evaluation (Addendum)
Anesthesia Evaluation  Patient identified by MRN, date of birth, ID band Patient awake    Reviewed: Allergy & Precautions, NPO status , Patient's Chart, lab work & pertinent test results  History of Anesthesia Complications (+) Emergence Delirium  Airway Mallampati: II  TM Distance: >3 FB     Dental   Pulmonary COPD, Current Smoker,    breath sounds clear to auscultation       Cardiovascular hypertension, + Peripheral Vascular Disease   Rhythm:Regular Rate:Normal     Neuro/Psych    GI/Hepatic Neg liver ROS, hiatal hernia, GERD  ,  Endo/Other  diabetes  Renal/GU negative Renal ROS     Musculoskeletal   Abdominal   Peds  Hematology   Anesthesia Other Findings   Reproductive/Obstetrics                            Anesthesia Physical Anesthesia Plan  ASA: III  Anesthesia Plan: General   Post-op Pain Management:    Induction: Intravenous  PONV Risk Score and Plan: Ondansetron, Dexamethasone and Midazolam  Airway Management Planned: LMA  Additional Equipment:   Intra-op Plan:   Post-operative Plan: Extubation in OR  Informed Consent: I have reviewed the patients History and Physical, chart, labs and discussed the procedure including the risks, benefits and alternatives for the proposed anesthesia with the patient or authorized representative who has indicated his/her understanding and acceptance.     Dental advisory given  Plan Discussed with: CRNA and Anesthesiologist  Anesthesia Plan Comments:         Anesthesia Quick Evaluation

## 2018-07-08 NOTE — Progress Notes (Signed)
Pharmacy Antibiotic Note  [name] is a 58 y.o. female admitted today with osteomyelitis of right great toe.  Zosyn and Vancomycin begun.    Pt is s/p TKA today. Plan is for 24 hr of post op abx. She will get her last dose of dapto today and zosyn tomorrow.   Plan: Dc abx after 24hr post op  Height: 5\' 10"  (177.8 cm) Weight: 231 lb 0.7 oz (104.8 kg) IBW/kg (Calculated) : 68.5  Temp (24hrs), Avg:98.3 F (36.8 C), Min:97.5 F (36.4 C), Max:100.7 F (38.2 C)  Recent Labs  Lab 07/04/18 1209 07/04/18 1641 07/04/18 1701 07/04/18 2340 07/08/18 0432  WBC 10.9* 11.1*  --   --  10.7*  CREATININE 0.78 0.75  --   --  0.97  LATICACIDVEN 5.7*  --  1.9 1.7  --     Estimated Creatinine Clearance: 85.9 mL/min (by C-G formula based on SCr of 0.97 mg/dL).    Allergies  Allergen Reactions  . Aleve [Naproxen Sodium] Hives and Itching  . Cortisone Swelling    Internal organs  . Prednisone Swelling    Makes internal organs swell  . Vancomycin Itching    Antimicrobials this admission:  Vancomycin x 1 on 1/27 (partial dose, as developed itching and infusion stopped)  Zosyn 1/27>>2/1  Daptomycin 1/27>>1/31  Dose adjustments this admission:  n/a  Microbiology results:  1/27 blood x 2 - ngtd  Onnie Boer, PharmD, BCIDP, AAHIVP, CPP Infectious Disease Pharmacist 07/08/2018 1:07 PM

## 2018-07-08 NOTE — Progress Notes (Signed)
Patient ID: [name], female   DOB: 1960/12/16, 58 y.o.   MRN: [id number]  PROGRESS NOTE    [name]  KNL:[id number] DOB: August 19, 1960 DOA: 07/04/2018 PCP: Chipper Herb, MD   Brief Narrative:  58 year old female with history of COPD, diabetes mellitus type 2, DVT, GERD, hyperlipidemia, hypertension, hypothyroidism, obesity, PAD with prior toe amputations, depression presented on 07/04/2018 due to chronic nonhealing wound of right great toe with purulent drainage, seen by PCP and admitted for right great toe osteomyelitis.  Orthopedics and vascular surgery were consulted.   Assessment & Plan:   Principal Problem:   Osteomyelitis of great toe of right foot (HCC) Active Problems:   Diabetic infection of right foot (HCC)   Hyperlipidemia   SMOKER   Essential hypertension   GERD (gastroesophageal reflux disease)   Chronic obstructive pulmonary disease (HCC)   Obesity (BMI 30-39.9)   Hypothyroidism   Peripheral vascular insufficiency (HCC)   Deep vein thrombosis (DVT) of both lower extremities (HCC)   Intermittent claudication (HCC)   Mild protein-calorie malnutrition (HCC)   Right great toe osteomyelitis/diabetic foot infection/severe PAD -Vascular surgery and orthopedics following.  No options for revascularization currently.   Continue broad-spectrum antibiotics Zosyn and daptomycin.  Currently hemodynamically stable -Plan for amputation today by orthopedics.  Diabetes mellitus type 2 with peripheral neuropathy with hyperglycemia -Metformin on hold.  Increase Lantus to 20 units daily, continue insulin sliding scale coverage.  A1c 8.8.  Essential hypertension -Continue losartan.  Monitor blood pressure  Hyperlipidemia Continue atorvastatin  Hypothyroidism -Continue Synthroid  GERD  -continue PPI  COPD -Stable.  No signs of bronchospasm  Tobacco abuse -Cessation counseled by prior hospitalist.  Obesity  -Outpatient follow-up  History of  DVTs -Apparently patient has been on Coumadin for the last 13 years.  She was also on aspirin and Plavix.  Currently antiplatelets and Coumadin on hold for possible surgery.   DVT prophylaxis: Lovenox Code Status:  Full  Family Communication: None at bedside Disposition Plan: Pending clinical improvement  Consultants: Orthopedic/vascular surgery  Procedures: None  Antimicrobials: Daptomycin and Zosyn   Subjective: Patient seen and examined at bedside.  Denies any worsening  pain.  No nausea or vomiting.  Objective: Vitals:   07/07/18 0511 07/07/18 1500 07/07/18 2102 07/08/18 0513  BP: 138/82 116/75 133/83 131/78  Pulse: 69 80 86 88  Resp: 12 14 12 20   Temp: 98 F (36.7 C) 97.7 F (36.5 C) (!) 100.7 F (38.2 C) 98.4 F (36.9 C)  TempSrc: Oral Oral Oral Oral  SpO2: 99% 98% 100% 98%  Weight:      Height:        Intake/Output Summary (Last 24 hours) at 07/08/2018 1119 Last data filed at 07/08/2018 0327 Gross per 24 hour  Intake 718.45 ml  Output -  Net 718.45 ml   Filed Weights   07/04/18 1853  Weight: 104.8 kg    Examination:  General exam: No acute distress Respiratory system: Bilateral decreased breath sounds at bases, scattered crackles Cardiovascular system: S1 & S2 heard, rate controlled Gastrointestinal system: Abdomen is obese, nondistended, soft and nontender. Normal bowel sounds heard. Extremities: No edema or cyanosis.  Tip of right great toe with dry gangrene.  Amputated right fourth and fifth toes.  Data Reviewed: I have personally reviewed following labs and imaging studies  CBC: Recent Labs  Lab 07/04/18 1209 07/04/18 1641 07/08/18 0432  WBC 10.9* 11.1* 10.7*  NEUTROABS 5.5 5.7 5.1  HGB 15.3* 14.7 14.6  HCT 48.8*  45.7 44.2  MCV 90.7 88.6 88.8  PLT 231 236 509   Basic Metabolic Panel: Recent Labs  Lab 07/04/18 1209 07/04/18 1641 07/08/18 0432  NA 139 137 143  K 4.2 3.8 4.4  CL 98 98 108  CO2 26 28 27   GLUCOSE 203* 157* 223*  BUN  11 12 16   CREATININE 0.78 0.75 0.97  CALCIUM 9.6 9.3 9.0  MG  --   --  1.7   GFR: Estimated Creatinine Clearance: 85.9 mL/min (by C-G formula based on SCr of 0.97 mg/dL). Liver Function Tests: Recent Labs  Lab 07/04/18 1209 07/04/18 1641  AST 20 20  ALT 22 19  ALKPHOS 57 48  BILITOT 0.3 0.5  PROT 7.8 7.2  ALBUMIN 3.5 3.2*   No results for input(s): LIPASE, AMYLASE in the last 168 hours. No results for input(s): AMMONIA in the last 168 hours. Coagulation Profile: Recent Labs  Lab 07/04/18 1209 07/04/18 1641  INR 1.52 1.54   Cardiac Enzymes: Recent Labs  Lab 07/05/18 0213  CKTOTAL 87   BNP (last 3 results) No results for input(s): PROBNP in the last 8760 hours. HbA1C: No results for input(s): HGBA1C in the last 72 hours. CBG: Recent Labs  Lab 07/07/18 1523 07/07/18 1814 07/07/18 2059 07/08/18 0810 07/08/18 0947  GLUCAP 342* 192* 185* 217* 194*   Lipid Profile: No results for input(s): CHOL, HDL, LDLCALC, TRIG, CHOLHDL, LDLDIRECT in the last 72 hours. Thyroid Function Tests: No results for input(s): TSH, T4TOTAL, FREET4, T3FREE, THYROIDAB in the last 72 hours. Anemia Panel: No results for input(s): VITAMINB12, FOLATE, FERRITIN, TIBC, IRON, RETICCTPCT in the last 72 hours. Sepsis Labs: Recent Labs  Lab 07/04/18 1209 07/04/18 1641 07/04/18 1701 07/04/18 2340  PROCALCITON  --  <0.10  --   --   LATICACIDVEN 5.7*  --  1.9 1.7    Recent Results (from the past 240 hour(s))  Culture, blood (Routine x 2)     Status: None (Preliminary result)   Collection Time: 07/04/18 12:09 PM  Result Value Ref Range Status   Specimen Description BLOOD LEFT HAND  Final   Special Requests   Final    BOTTLES DRAWN AEROBIC ONLY Blood Culture adequate volume   Culture   Final    NO GROWTH 4 DAYS Performed at [facility], [place] [address] [place], [place] [address]    Report Status PENDING  Incomplete  Culture, blood (Routine x 2)     Status: None (Preliminary  result)   Collection Time: 07/04/18  5:20 PM  Result Value Ref Range Status   Specimen Description BLOOD RIGHT ANTECUBITAL  Final   Special Requests   Final    BOTTLES DRAWN AEROBIC AND ANAEROBIC Blood Culture adequate volume   Culture   Final    NO GROWTH 4 DAYS Performed at [facility], [place] [address] [place], [place] [address]    Report Status PENDING  Incomplete  Surgical pcr screen     Status: None   Collection Time: 07/08/18  7:44 AM  Result Value Ref Range Status   MRSA, PCR NEGATIVE NEGATIVE Final   Staphylococcus aureus NEGATIVE NEGATIVE Final    Comment: (NOTE) The Xpert SA Assay (FDA approved for NASAL specimens in patients 1 years of age and older), is one component of a comprehensive surveillance program. It is not intended to diagnose infection nor to guide or monitor treatment. Performed at [facility], [place] [address], [place], [place] [address]  Radiology Studies: No results found.      Scheduled Meds: . [MAR Hold] atorvastatin  40 mg Oral Daily  . [MAR Hold] cholecalciferol  5,000 Units Oral Daily  . [MAR Hold] enoxaparin (LOVENOX) injection  40 mg Subcutaneous Q24H  . [MAR Hold] escitalopram  30 mg Oral Daily  . [MAR Hold] furosemide  20 mg Oral Daily  . [MAR Hold] insulin aspart  0-15 Units Subcutaneous TID WC  . [MAR Hold] insulin aspart  0-5 Units Subcutaneous QHS  . [MAR Hold] insulin glargine  15 Units Subcutaneous QHS  . [MAR Hold] levothyroxine  100 mcg Oral Daily  . [MAR Hold] losartan  50 mg Oral Daily  . [MAR Hold] mupirocin ointment  1 application Topical BID  . [MAR Hold] nicotine  21 mg Transdermal Daily  . [MAR Hold] pantoprazole  40 mg Oral Daily  . [MAR Hold] umeclidinium bromide  1 puff Inhalation Daily   Continuous Infusions: . [MAR Hold] DAPTOmycin (CUBICIN)  IV Stopped (07/07/18 2127)  . lactated ringers    . [MAR Hold] piperacillin-tazobactam (ZOSYN)  IV 3.375 g (07/08/18 9150)     LOS: 4 days         Aline August, MD [facility] Pager [phone]  If 7PM-7AM, please contact night-coverage [email or web address] Password [email or web address] 07/08/2018, 11:19 AM

## 2018-07-08 NOTE — Interval H&P Note (Signed)
History and Physical Interval Note:  07/08/2018 8:03 AM  [name]  has presented today for surgery, with the diagnosis of Osteomyelitis and Gangrene Right Foot  The various methods of treatment have been discussed with the patient and family. After consideration of risks, benefits and other options for treatment, the patient has consented to  Procedure(s): RIGHT BELOW KNEE AMPUTATION (Right) as a surgical intervention .  The patient's history has been reviewed, patient examined, no change in status, stable for surgery.  I have reviewed the patient's chart and labs.  Questions were answered to the patient's satisfaction.     Erlinda Hong, PA-C [employer] [phone]

## 2018-07-08 NOTE — Progress Notes (Signed)
Physical medicine rehabilitation consult requested chart reviewed. Plan is for right BKA today 07/08/2018. Will await surgical intervention to be completed as well as physical occupational therapy evaluations and follow-up with formal rehabilitation consult and recommendations

## 2018-07-08 NOTE — Op Note (Signed)
   Date of Surgery: 07/08/2018  INDICATIONS: Ms. [name] is a 58 y.o.-year-old female who has severe peripheral vascular disease she is status post multiple revascularizations of the right lower extremity and currently has no revascularization options.  She has gangrenous changes to the right foot including osteomyelitis and presents at this time for transtibial amputation.Marland Kitchen  PREOPERATIVE DIAGNOSIS: Osteomyelitis and ischemic changes right foot  POSTOPERATIVE DIAGNOSIS: Same.  PROCEDURE: Transtibial amputation Application of Prevena wound VAC  SURGEON: Sharol Given, M.D.  ANESTHESIA:  general  IV FLUIDS AND URINE: See anesthesia.  ESTIMATED BLOOD LOSS: Minimal 24 hours mL.  COMPLICATIONS: None.  DESCRIPTION OF PROCEDURE: The patient was brought to the operating room and underwent a general anesthetic. After adequate levels of anesthesia were obtained patient's lower extremity was prepped using DuraPrep draped into a sterile field. A timeout was called. The foot was draped out of the sterile field with impervious stockinette. A transverse incision was made 11 cm distal to the tibial tubercle. This curved proximally and a large posterior flap was created. The tibia was transected 1 cm proximal to the skin incision. The fibula was transected just proximal to the tibial incision. The tibia was beveled anteriorly. A large posterior flap was created. The sciatic nerve was pulled cut and allowed to retract. The vascular bundles were suture ligated with 2-0 silk. The deep and superficial fascial layers were closed using #1 Vicryl. The skin was closed using staples and 2-0 nylon. The wound was covered with a Prevena restor and custom wound VAC. There was a good suction fit. A prosthetic shrinker will be applied. Patient was extubated taken to the PACU in stable condition.   DISCHARGE PLANNING:  Antibiotic duration: 24 hours  Weightbearing: Nonweightbearing on the right  Pain medication: high dose  opoid pathway  Dressing care/ Wound VAC:vac for 2 weeks  Discharge to: CIR vs SNF  Follow-up: In the office 1 week post operative.  [name], MD [place] 11:54 AM

## 2018-07-08 NOTE — NC FL2 (Signed)
[place] MEDICAID FL2 LEVEL OF CARE SCREENING TOOL     IDENTIFICATION  Patient Name: [name] Birthdate: [birth year] Sex: female Admission Date (Current Location): 07/04/2018  [facility] and [place] Number:  [employer] and Address:  [facility] [place]. [facility], [place] [address], [place],  [address]      Provider Number: [id number]  Attending Physician Name and Address:  [name], MD  Relative Name and Phone Number:  Kasandra Knudsen, spouse, [phone]    Current Level of Care: Hospital Recommended Level of Care: [place] Prior Approval Number:    Date Approved/Denied:   PASRR Number: [id number]  Discharge Plan: SNF    Current Diagnoses: Patient Active Problem List   Diagnosis Date Noted  . Acquired absence of right leg below knee ([place]) 07/08/2018  . Mild protein-calorie malnutrition ([place])   . Osteomyelitis of great toe of right foot ([place]) 07/04/2018  . Intermittent claudication ([place]) 05/11/2017  . Deep vein thrombosis (DVT) of both lower extremities ([place]) 01/05/2017  . Peripheral vascular insufficiency ([place]) 09/25/2015  . Hypothyroidism 01/29/2014  . Obesity (BMI 30-39.9) 08/16/2013  . Impetigo 07/28/2013  . Chronic obstructive pulmonary disease ([place])   . Malignant neoplasm of uterus ([place])   . GERD (gastroesophageal reflux disease) 10/18/2012  . Hernia, hiatal 10/18/2012  . Diabetic infection of right foot ([place]) 04/18/2008  . Hyperlipidemia 04/18/2008  . SMOKER 04/18/2008  . Essential hypertension 04/18/2008    Orientation RESPIRATION BLADDER Height & Weight     Self, Time, Situation, Place  O2(nasal cannula 2L) Continent Weight: 104.8 kg Height:  5\' 10"  (177.8 cm)  BEHAVIORAL SYMPTOMS/MOOD NEUROLOGICAL BOWEL NUTRITION STATUS      Continent Diet(Please see DC Summary)  AMBULATORY STATUS COMMUNICATION OF NEEDS Skin   Extensive Assist Verbally Surgical wounds, Wound Vac(incision on foot/non-pressure wound on  toe;pravena wound vac)                       Personal Care Assistance Level of Assistance  Bathing, Feeding, Dressing Bathing Assistance: Maximum assistance Feeding assistance: Independent Dressing Assistance: Limited assistance     Functional Limitations Info  Sight, Hearing, Speech Sight Info: Adequate Hearing Info: Adequate Speech Info: Adequate    SPECIAL CARE FACTORS FREQUENCY  PT (By licensed PT), OT (By licensed OT)     PT Frequency: 5x/week OT Frequency: 3x/week            Contractures Contractures Info: Not present    Additional Factors Info  Code Status, Allergies, Insulin Sliding Scale Code Status Info: Full Allergies Info:  Aleve Naproxen Sodium, Cortisone, Prednisone, Vancomycin   Insulin Sliding Scale Info: 3x daily with meals and at bedtime       Current Medications (07/08/2018):  This is the current hospital active medication list Current Facility-Administered Medications  Medication Dose Route Frequency Provider Last Rate Last Dose  . [START ON 07/09/2018] acetaminophen (TYLENOL) tablet 325-650 mg  325-650 mg Oral Q6H PRN Rayburn, Neta Mends, PA-C      . acetaminophen (TYLENOL) tablet 500 mg  500 mg Oral Q6H Rayburn, Neta Mends, PA-C   500 mg at 07/08/18 1349  . albuterol (PROVENTIL) (2.5 MG/3ML) 0.083% nebulizer solution 3 mL  3 mL Inhalation Q6H PRN Lady Deutscher, MD      . atorvastatin (LIPITOR) tablet 40 mg  40 mg Oral Daily Lady Deutscher, MD   40 mg at 07/08/18 1349  . cholecalciferol (VITAMIN D) tablet 5,000 Units  5,000  Units Oral Daily Lady Deutscher, MD   5,000 Units at 07/08/18 1350  . DAPTOmycin (CUBICIN) 600 mg in sodium chloride 0.9 % IVPB  600 mg Intravenous Q2000 Aline August, MD   Stopped at 07/07/18 2127  . diphenhydrAMINE (BENADRYL) capsule 25 mg  25 mg Oral Q6H PRN Opyd, Ilene Qua, MD   25 mg at 07/04/18 2040  . docusate sodium (COLACE) capsule 100 mg  100 mg Oral BID Rayburn, Neta Mends, PA-C    100 mg at 07/08/18 1350  . [START ON 07/09/2018] enoxaparin (LOVENOX) injection 40 mg  40 mg Subcutaneous Q24H Rayburn, Shawn Montgomery, PA-C      . escitalopram (LEXAPRO) tablet 30 mg  30 mg Oral Daily Lady Deutscher, MD      . furosemide (LASIX) tablet 20 mg  20 mg Oral Daily Lady Deutscher, MD   20 mg at 07/08/18 1350  . HYDROcodone-acetaminophen (NORCO) 7.5-325 MG per tablet 1-2 tablet  1-2 tablet Oral Q4H PRN Rayburn, Neta Mends, PA-C      . HYDROcodone-acetaminophen (NORCO/VICODIN) 5-325 MG per tablet 1-2 tablet  1-2 tablet Oral Q4H PRN Rayburn, Neta Mends, PA-C      . insulin aspart (novoLOG) injection 0-15 Units  0-15 Units Subcutaneous TID WC Modena Jansky, MD   3 Units at 07/08/18 1351  . insulin aspart (novoLOG) injection 0-5 Units  0-5 Units Subcutaneous QHS Modena Jansky, MD   2 Units at 07/05/18 2224  . insulin glargine (LANTUS) injection 20 Units  20 Units Subcutaneous QHS Alekh, Kshitiz, MD      . lactated ringers infusion   Intravenous Continuous Belinda Block, MD      . levothyroxine (SYNTHROID, LEVOTHROID) tablet 100 mcg  100 mcg Oral Daily Lady Deutscher, MD   100 mcg at 07/08/18 0601  . losartan (COZAAR) tablet 50 mg  50 mg Oral Daily Lady Deutscher, MD   50 mg at 07/08/18 1350  . metoCLOPramide (REGLAN) tablet 5-10 mg  5-10 mg Oral Q8H PRN Rayburn, Neta Mends, PA-C       Or  . metoCLOPramide (REGLAN) injection 5-10 mg  5-10 mg Intravenous Q8H PRN Rayburn, Neta Mends, PA-C      . morphine 2 MG/ML injection 0.5-1 mg  0.5-1 mg Intravenous Q2H PRN Rayburn, Neta Mends, PA-C   1 mg at 07/08/18 1339  . mupirocin ointment (BACTROBAN) 2 % 1 application  1 application Topical BID Lady Deutscher, MD   1 application at 19/62/22 2100  . nicotine (NICODERM CQ - dosed in mg/24 hours) patch 21 mg  21 mg Transdermal Daily Blount, Xenia T, NP   21 mg at 07/08/18 1355  . ondansetron (ZOFRAN) tablet 4 mg  4 mg Oral Q6H PRN Rayburn, Neta Mends, PA-C       Or  . ondansetron (ZOFRAN) injection 4 mg  4 mg Intravenous Q6H PRN Rayburn, Neta Mends, PA-C      . pantoprazole (PROTONIX) EC tablet 40 mg  40 mg Oral Daily Lady Deutscher, MD   40 mg at 07/08/18 1350  . piperacillin-tazobactam (ZOSYN) IVPB 3.375 g  3.375 g Intravenous Q8H Alekh, Kshitiz, MD 12.5 mL/hr at 07/08/18 1346 3.375 g at 07/08/18 1346  . umeclidinium bromide (INCRUSE ELLIPTA) 62.5 MCG/INH 1 puff  1 puff Inhalation Daily Lady Deutscher, MD         Discharge Medications: Please see discharge summary for a list of discharge medications.  Relevant Imaging Results:  Relevant Lab  Results:   Additional Information SSN: [place] [id number]  [place], [place]

## 2018-07-08 NOTE — Progress Notes (Signed)
Orthopedic Tech Progress Note Patient Details:  [name] [birth year] [id number]  Patient ID: [name], female   DOB: [birth year], 58 y.o.   MRN: [id number] Called in order to [facility].  Karolee Stamps 07/08/2018, 1:49 PM

## 2018-07-08 NOTE — Transfer of Care (Signed)
Immediate Anesthesia Transfer of Care Note  Patient: [name]  Procedure(s) Performed: RIGHT BELOW KNEE AMPUTATION (Right )  Patient Location: PACU  Anesthesia Type:General  Level of Consciousness: awake, alert , oriented and patient cooperative  Airway & Oxygen Therapy: Patient Spontanous Breathing and Patient connected to nasal cannula oxygen  Post-op Assessment: Report given to RN, Post -op Vital signs reviewed and stable and Patient moving all extremities  Post vital signs: Reviewed and stable  Last Vitals:  Vitals Value Taken Time  BP 151/88 07/08/2018 12:07 PM  Temp 36.4 C 07/08/2018 12:07 PM  Pulse 95 07/08/2018 12:08 PM  Resp 14 07/08/2018 12:08 PM  SpO2 97 % 07/08/2018 12:08 PM  Vitals shown include unvalidated device data.  Last Pain:  Vitals:   07/08/18 1207  TempSrc:   PainSc: (P) 9          Complications: No apparent anesthesia complications

## 2018-07-08 NOTE — Anesthesia Procedure Notes (Signed)
Procedure Name: Intubation Date/Time: 07/08/2018 11:17 AM Performed by: Belinda Block, MD Pre-anesthesia Checklist: Patient identified, Emergency Drugs available, Suction available, Patient being monitored and Timeout performed Patient Re-evaluated:Patient Re-evaluated prior to induction Oxygen Delivery Method: Circle system utilized Preoxygenation: Pre-oxygenation with 100% oxygen Induction Type: IV induction Ventilation: Mask ventilation without difficulty Laryngoscope Size: Mac and 3 Grade View: Grade II Tube type: Oral Tube size: 7.0 mm Number of attempts: 1 Airway Equipment and Method: Stylet Placement Confirmation: ETT inserted through vocal cords under direct vision,  positive ETCO2 and breath sounds checked- equal and bilateral Secured at: 22 cm Tube secured with: Tape Dental Injury: Teeth and Oropharynx as per pre-operative assessment

## 2018-07-09 DIAGNOSIS — F17200 Nicotine dependence, unspecified, uncomplicated: Secondary | ICD-10-CM

## 2018-07-09 DIAGNOSIS — I739 Peripheral vascular disease, unspecified: Secondary | ICD-10-CM

## 2018-07-09 LAB — GLUCOSE, CAPILLARY
GLUCOSE-CAPILLARY: 134 mg/dL — AB (ref 70–99)
Glucose-Capillary: 160 mg/dL — ABNORMAL HIGH (ref 70–99)
Glucose-Capillary: 148 mg/dL — ABNORMAL HIGH (ref 70–99)
Glucose-Capillary: 179 mg/dL — ABNORMAL HIGH (ref 70–99)
Glucose-Capillary: 167 mg/dL — ABNORMAL HIGH (ref 70–99)

## 2018-07-09 LAB — CULTURE, BLOOD (ROUTINE X 2)
Culture: NO GROWTH
Culture: NO GROWTH
Special Requests: ADEQUATE
Special Requests: ADEQUATE

## 2018-07-09 MED ORDER — BISACODYL 5 MG PO TBEC
5.0000 mg | DELAYED_RELEASE_TABLET | Freq: Once | ORAL | Status: DC
  Filled 2018-07-09: qty 1

## 2018-07-09 MED ORDER — OXYCODONE HCL 5 MG PO TABS
5.0000 mg | ORAL_TABLET | ORAL | Status: DC | PRN
  Administered 2018-07-09: 5 mg via ORAL
  Administered 2018-07-10 (×4): 10 mg via ORAL
  Administered 2018-07-11 (×5): 5 mg via ORAL
  Filled 2018-07-09 (×2): qty 1
  Filled 2018-07-09 (×4): qty 2
  Filled 2018-07-09 (×2): qty 1
  Filled 2018-07-09 (×3): qty 2

## 2018-07-09 NOTE — Progress Notes (Signed)
Patient stated Vicodin makes her nauseous/vomit. Paged MD Starla Link about vicodin alternative at 1449. PRN pain meds updated.   Removed nicotine patch at 1252 stated may be making her nauseous.

## 2018-07-09 NOTE — Progress Notes (Signed)
Patient ID: [name], female   DOB: 1961/04/13, 58 y.o.   MRN: [id number]  PROGRESS NOTE    [name]  DPO:[id number] DOB: 1961/03/30 DOA: 07/04/2018 PCP: Chipper Herb, MD   Brief Narrative:  58 year old female with history of COPD, diabetes mellitus type 2, DVT, GERD, hyperlipidemia, hypertension, hypothyroidism, obesity, PAD with prior toe amputations, depression presented on 07/04/2018 due to chronic nonhealing wound of right great toe with purulent drainage, seen by PCP and admitted for right great toe osteomyelitis.  Orthopedics and vascular surgery were consulted.   Assessment & Plan:   Principal Problem:   Osteomyelitis of great toe of right foot (HCC) Active Problems:   Diabetic infection of right foot (HCC)   Hyperlipidemia   SMOKER   Essential hypertension   GERD (gastroesophageal reflux disease)   Chronic obstructive pulmonary disease (HCC)   Obesity (BMI 30-39.9)   Hypothyroidism   Peripheral vascular insufficiency (HCC)   Deep vein thrombosis (DVT) of both lower extremities (HCC)   Intermittent claudication (HCC)   Mild protein-calorie malnutrition (HCC)   Acquired absence of right leg below knee (HCC)   Right great toe osteomyelitis/diabetic foot infection/severe PAD -Vascular surgery and orthopedics following.  No options for revascularization currently.   -Status post transtibial amputation on 07/08/2018 by Dr. Sharol Given with a wound VAC.  Will discontinue Zosyn and daptomycin.  Continue broad-spectrum antibiotics Zosyn and daptomycin.  Currently hemodynamically stable -PT/OT evaluation.  Wound care as per orthopedics recommendations.  Diabetes mellitus type 2 with peripheral neuropathy with hyperglycemia -Metformin on hold.  continue Lantus, continue insulin sliding scale coverage.  A1c 8.8.  Essential hypertension -Continue losartan.  Monitor blood pressure  Hyperlipidemia Continue atorvastatin  Hypothyroidism -Continue  Synthroid  GERD  -continue PPI  COPD -Stable.  No signs of bronchospasm  Tobacco abuse -Cessation counseled by prior hospitalist.  Obesity  -Outpatient follow-up  History of DVTs -Apparently patient has been on Coumadin for the last 13 years.  She was also on aspirin and Plavix.  Currently antiplatelets and Coumadin; will resume only when cleared by orthopedics.   DVT prophylaxis: Lovenox Code Status:  Full  Family Communication: None at bedside Disposition Plan: We will probably need CIR Consultants: Orthopedic/vascular surgery  Procedures: Transtibial amputation and wound VAC placement on 07/08/2018  Antimicrobials: Daptomycin and Zosyn   Subjective: Patient seen and examined at bedside.  Complains of intermittent right lower extremity pain.  No overnight fever, nausea or vomiting.   Objective: Vitals:   07/08/18 1305 07/08/18 2022 07/09/18 0027 07/09/18 0509  BP: (!) 147/94 (!) 141/79 (!) 142/76 (!) 157/72  Pulse: 65 100 84 71  Resp:   18 17  Temp:  98.1 F (36.7 C) (!) 97.4 F (36.3 C) (!) 97.5 F (36.4 C)  TempSrc:  Oral    SpO2:  100% 100% 95%  Weight:      Height:        Intake/Output Summary (Last 24 hours) at 07/09/2018 1014 Last data filed at 07/08/2018 1853 Gross per 24 hour  Intake 960.46 ml  Output 20 ml  Net 940.46 ml   Filed Weights   07/04/18 1853  Weight: 104.8 kg    Examination:  General exam: No distress Respiratory system: Bilateral decreased breath sounds at bases; no wheezing Cardiovascular system: S1 & S2 heard, rate controlled Gastrointestinal system: Abdomen is obese, nondistended, soft and nontender. Normal bowel sounds heard. Extremities: No edema or cyanosis.  Right transtibial amputation with VAC dressing in place.  Data Reviewed: I have personally reviewed following labs and imaging studies  CBC: Recent Labs  Lab 07/04/18 1209 07/04/18 1641 07/08/18 0432  WBC 10.9* 11.1* 10.7*  NEUTROABS 5.5 5.7 5.1  HGB 15.3*  14.7 14.6  HCT 48.8* 45.7 44.2  MCV 90.7 88.6 88.8  PLT 231 236 093   Basic Metabolic Panel: Recent Labs  Lab 07/04/18 1209 07/04/18 1641 07/08/18 0432  NA 139 137 143  K 4.2 3.8 4.4  CL 98 98 108  CO2 26 28 27   GLUCOSE 203* 157* 223*  BUN 11 12 16   CREATININE 0.78 0.75 0.97  CALCIUM 9.6 9.3 9.0  MG  --   --  1.7   GFR: Estimated Creatinine Clearance: 85.9 mL/min (by C-G formula based on SCr of 0.97 mg/dL). Liver Function Tests: Recent Labs  Lab 07/04/18 1209 07/04/18 1641  AST 20 20  ALT 22 19  ALKPHOS 57 48  BILITOT 0.3 0.5  PROT 7.8 7.2  ALBUMIN 3.5 3.2*   No results for input(s): LIPASE, AMYLASE in the last 168 hours. No results for input(s): AMMONIA in the last 168 hours. Coagulation Profile: Recent Labs  Lab 07/04/18 1209 07/04/18 1641  INR 1.52 1.54   Cardiac Enzymes: Recent Labs  Lab 07/05/18 0213  CKTOTAL 87   BNP (last 3 results) No results for input(s): PROBNP in the last 8760 hours. HbA1C: No results for input(s): HGBA1C in the last 72 hours. CBG: Recent Labs  Lab 07/08/18 1208 07/08/18 1301 07/08/18 1716 07/08/18 2201 07/09/18 0750  GLUCAP 157* 155* 168* 175* 160*   Lipid Profile: No results for input(s): CHOL, HDL, LDLCALC, TRIG, CHOLHDL, LDLDIRECT in the last 72 hours. Thyroid Function Tests: No results for input(s): TSH, T4TOTAL, FREET4, T3FREE, THYROIDAB in the last 72 hours. Anemia Panel: No results for input(s): VITAMINB12, FOLATE, FERRITIN, TIBC, IRON, RETICCTPCT in the last 72 hours. Sepsis Labs: Recent Labs  Lab 07/04/18 1209 07/04/18 1641 07/04/18 1701 07/04/18 2340  PROCALCITON  --  <0.10  --   --   LATICACIDVEN 5.7*  --  1.9 1.7    Recent Results (from the past 240 hour(s))  Culture, blood (Routine x 2)     Status: None   Collection Time: 07/04/18 12:09 PM  Result Value Ref Range Status   Specimen Description BLOOD LEFT HAND  Final   Special Requests   Final    BOTTLES DRAWN AEROBIC ONLY Blood Culture  adequate volume Performed at [facility], [address], [place], [place] [address]    Culture NO GROWTH 5 DAYS  Final   Report Status 07/09/2018 FINAL  Final  Culture, blood (Routine x 2)     Status: None   Collection Time: 07/04/18  5:20 PM  Result Value Ref Range Status   Specimen Description BLOOD RIGHT ANTECUBITAL  Final   Special Requests   Final    BOTTLES DRAWN AEROBIC AND ANAEROBIC Blood Culture adequate volume Performed at [facility], [place] [address], [place], [place] [address]    Culture NO GROWTH 5 DAYS  Final   Report Status 07/09/2018 FINAL  Final  Surgical pcr screen     Status: None   Collection Time: 07/08/18  7:44 AM  Result Value Ref Range Status   MRSA, PCR NEGATIVE NEGATIVE Final   Staphylococcus aureus NEGATIVE NEGATIVE Final    Comment: (NOTE) The Xpert SA Assay (FDA approved for NASAL specimens in patients 60 years of age and older), is one component of a  comprehensive surveillance program. It is not intended to diagnose infection nor to guide or monitor treatment. Performed at [facility], [place] [address], [place], [place] [address]          Radiology Studies: No results found.      Scheduled Meds: . atorvastatin  40 mg Oral Daily  . cholecalciferol  5,000 Units Oral Daily  . docusate sodium  100 mg Oral BID  . enoxaparin (LOVENOX) injection  40 mg Subcutaneous Q24H  . escitalopram  30 mg Oral Daily  . furosemide  20 mg Oral Daily  . insulin aspart  0-15 Units Subcutaneous TID WC  . insulin aspart  0-5 Units Subcutaneous QHS  . insulin glargine  20 Units Subcutaneous QHS  . levothyroxine  100 mcg Oral Daily  . losartan  50 mg Oral Daily  . mupirocin ointment  1 application Topical BID  . nicotine  21 mg Transdermal Daily  . pantoprazole  40 mg Oral Daily  . umeclidinium bromide  1 puff Inhalation Daily   Continuous Infusions: . lactated ringers       LOS: 5 days        Aline August, MD [facility] Pager [phone]  If 7PM-7AM, please contact night-coverage [email or web address] Password [email or web address] 07/09/2018, 10:14 AM

## 2018-07-09 NOTE — Progress Notes (Signed)
Occupational Therapy Evaluation Patient Details Name: [name] MRN: [id number] DOB: 1960/12/20 Today's Date: 07/09/2018    History of Present Illness 58 year old female with history of COPD, diabetes mellitus type 2, DVT, GERD, hyperlipidemia, hypertension, hypothyroidism, obesity, PAD with prior toe amputations, depression presented on 07/04/2018 due to chronic nonhealing wound of right great toe with purulent drainage, seen by PCP and admitted for right great toe osteomyelitis. S/P Right below knee amputation 1/31.    Clinical Impression   PTA, pt was independent with ADL and functional mobility without external device. Pt s/p RBKA with functional limitations listed below (see OT problem list). Throughout session pt demonstrated appropriate coping mechanisms. Pt is highly motivated and initiated goal for our session to transfer to [facility]. Pt currently requires minA-minguard for functional mobility with ADL. Pt will continue to benefit from continued skilled OT services to allow d/c to venue listed below. Will continue to follow acutely.     Follow Up Recommendations  CIR    Equipment Recommendations  Other (comment)(defer to next venue)    Recommendations for Other Services PT consult     Precautions / Restrictions Precautions Precautions: Fall      Mobility Bed Mobility Overal bed mobility: Modified Independent             General bed mobility comments: pt progressed supine>EOB with increased time  Transfers Overall transfer level: Needs assistance Equipment used: Rolling walker (2 wheeled) Transfers: Sit to/from [facility] Sit to Stand: Min assist;Min guard Stand pivot transfers: Min guard       General transfer comment: minA for initial sit<>stand, pt requested for no hands on assist and demonstrated sit<>stand with minguard for stability     Balance Overall balance assessment: Needs assistance Sitting-balance support: No upper  extremity supported;Feet unsupported Sitting balance-Leahy Scale: Normal Sitting balance - Comments: pt able to don L sock while sitting EOB   Standing balance support: Bilateral upper extremity supported Standing balance-Leahy Scale: Poor Standing balance comment: heavy reliance of BUE support                            ADL either performed or assessed with clinical judgement   ADL Overall ADL's : Needs assistance/impaired Eating/Feeding: Independent   Grooming: Set up;Sitting   Upper Body Bathing: Set up;Sitting   Lower Body Bathing: Set up;Sitting/lateral leans   Upper Body Dressing : Supervision/safety;Sitting   Lower Body Dressing: Supervision/safety;Sit to/from stand Lower Body Dressing Details (indicate cue type and reason): pt able to don L sock while sitting EOB  Toilet Transfer: Min Designer, jewellery Details (indicate cue type and reason): min guard for stand<>pivot transfer from EOB to [facility]; Toileting- Water quality scientist and Hygiene: Min guard;Sit to/from stand       Functional mobility during ADLs: Min guard;Minimal assistance;Rolling walker General ADL Comments: minA for initial stability and use of RW for mobility, pt progressed to minguard for functional mobility     Vision         Perception     Praxis      Pertinent Vitals/Pain Pain Assessment: 0-10 Pain Score: 7  Pain Location: R knee surgical site Pain Descriptors / Indicators: Burning;Discomfort;Grimacing Pain Intervention(s): Monitored during session;Patient requesting pain meds-RN notified     Hand Dominance Right   Extremity/Trunk Assessment Upper Extremity Assessment Upper Extremity Assessment: Overall WFL for tasks assessed   Lower Extremity Assessment Lower Extremity Assessment: RLE deficits/detail;Overall [facility] for tasks assessed RLE Deficits /  Details: RBKA, pt educated on importance of knee extension  RLE Sensation: (phantom limb pain/sensation)    Cervical / Trunk Assessment Cervical / Trunk Assessment: Normal   Communication Communication Communication: No difficulties   Cognition Arousal/Alertness: Awake/alert Behavior During Therapy: WFL for tasks assessed/performed Overall Cognitive Status: Within Functional Limits for tasks assessed                                 General Comments: pt demonstrated good safety awareness verbalizing reasoning for safe hand placement with sit<>stand;pt tearing up secondary to fear of falling prior to initial sit<>stand;pt with increased motivation to get out of bed and move around;pt appeared to demonstrate appropriate coping strategies with BKA   General Comments  pt's daughter present during session;educated pt on progressive muscle relaxation techniques to assist with pain management    Exercises     Shoulder Instructions      Home Living Family/patient expects to be discharged to:: Private residence Living Arrangements: Spouse/significant other Available Help at Discharge: Family;Available PRN/intermittently;Available 24 hours/day;Friend(s) Type of Home: House Home Access: Stairs to enter;Ramped entrance     Home Layout: One level     Bathroom Shower/Tub: Walk-in shower(8inch ledge)   Bathroom Toilet: Standard Bathroom Accessibility: Yes How Accessible: Accessible via walker Home Equipment: Shower seat;Grab bars - toilet;Grab bars - tub/shower;Walker - 2 wheels;Cane - single point;Walker - 4 wheels          Prior Functioning/Environment Level of Independence: Independent                 OT Problem List: Decreased activity tolerance;Impaired balance (sitting and/or standing);Decreased knowledge of use of DME or AE;Pain      OT Treatment/Interventions: Self-care/ADL training;Therapeutic exercise;DME and/or AE instruction;Manual therapy;Balance training;Patient/family education;Therapeutic activities;Neuromuscular education    OT Goals(Current goals  can be found in the care plan section) Acute Rehab OT Goals Patient Stated Goal: to be able to use the [facility] and get out of bed OT Goal Formulation: With patient Time For Goal Achievement: 07/23/18 Potential to Achieve Goals: Good ADL Goals Pt Will Perform Grooming: with modified independence;standing Pt Will Perform Lower Body Dressing: with modified independence;sit to/from stand Pt Will Transfer to Toilet: with modified independence;ambulating Additional ADL Goal #1: Pt/family will demonstrate understanding of residual limb management, edema control, and frequent skin inspection/skin care independently.  OT Frequency: Min 2X/week   Barriers to D/C:            Co-evaluation              AM-PAC OT "6 Clicks" Daily Activity     Outcome Measure Help from another person eating meals?: None Help from another person taking care of personal grooming?: A Little Help from another person toileting, which includes using toliet, bedpan, or urinal?: A Little Help from another person bathing (including washing, rinsing, drying)?: A Little Help from another person to put on and taking off regular upper body clothing?: A Little Help from another person to put on and taking off regular lower body clothing?: A Little 6 Click Score: 19   End of Session Equipment Utilized During Treatment: Gait belt;Rolling walker Nurse Communication: Mobility status;Patient requests pain meds  Activity Tolerance: Patient tolerated treatment well Patient left: in bed;with call bell/phone within reach;with family/visitor present;with nursing/sitter in room  OT Visit Diagnosis: Unsteadiness on feet (R26.81);Other abnormalities of gait and mobility (R26.89);Pain Pain - Right/Left: Right Pain - part of body: (  surgical site BKA)                Time: 7356-7014 OT Time Calculation (min): 53 min Charges:  OT General Charges $OT Visit: 1 Visit OT Evaluation $OT Eval Moderate Complexity: 1 Mod OT  Treatments $Self Care/Home Management : 23-37 mins $Therapeutic Exercise: 8-22 mins  Dorinda Hill OTR/L Acute Rehabilitation Services Office: [place] 07/09/2018, 10:06 AM

## 2018-07-09 NOTE — Evaluation (Signed)
Physical Therapy Evaluation Patient Details Name: [name] MRN: [id number] DOB: Nov 21, 1960 Today's Date: 07/09/2018   History of Present Illness  58 year old female with history of COPD, diabetes mellitus type 2, DVT, GERD, hyperlipidemia, hypertension, hypothyroidism, obesity, PAD with prior toe amputations, depression presented on 07/04/2018 due to chronic nonhealing wound of right great toe with purulent drainage, seen by PCP and admitted for right great toe osteomyelitis. S/P Right below knee amputation 1/31.   Clinical Impression  Pt was seen for mobility and strength testing to lend itself to goals for acute therapy, and also will recommend CIR based on her potential for improvement functionally.  Her husband and daughter are involved with her care but the pt is home alone when husband travels.  Follow acutely to work on distances and balance with gait, and progress her as able.  Will ask MD as to when she is expected to begin use of her brace for RLE, given that she currently has wound vac on that leg.    Follow Up Recommendations CIR    Equipment Recommendations  Rolling walker with 5" wheels    Recommendations for Other Services Rehab consult     Precautions / Restrictions Precautions Precautions: Fall Restrictions Weight Bearing Restrictions: No      Mobility  Bed Mobility Overal bed mobility: Modified Independent             General bed mobility comments: used bed rail to assist pivot to side of bed and back to bed  Transfers Overall transfer level: Needs assistance Equipment used: Rolling walker (2 wheeled) Transfers: Sit to/from Stand Sit to Stand: Min guard Stand pivot transfers: Min guard       General transfer comment: pt used bed height to increase control of standing  Ambulation/Gait Ambulation/Gait assistance: Min guard Gait Distance (Feet): 12 Feet Assistive device: Rolling walker (2 wheeled);1 person hand held assist   Gait  velocity: reduced Gait velocity interpretation: <1.8 ft/sec, indicate of risk for recurrent falls General Gait Details: pt is somewhat aware of heel to toe progression but is hopping on LLE now  Science writer    Modified Rankin (Stroke Patients Only)       Balance Overall balance assessment: Needs assistance Sitting-balance support: Feet supported Sitting balance-Leahy Scale: Good     Standing balance support: Bilateral upper extremity supported;During functional activity Standing balance-Leahy Scale: Poor Standing balance comment: pt is becoming more aware of LLE control and used reasonable pressure on UE's to walk                             Pertinent Vitals/Pain Pain Assessment: Faces Faces Pain Scale: Hurts even more Pain Location: R lower leg on surgery site Pain Descriptors / Indicators: Operative site guarding Pain Intervention(s): Limited activity within patient's tolerance;Monitored during session;Premedicated before session;Repositioned    Home Living Family/patient expects to be discharged to:: Private residence Living Arrangements: Spouse/significant other Available Help at Discharge: Family;Available PRN/intermittently;Available 24 hours/day;Friend(s) Type of Home: House Home Access: Stairs to enter;Ramped entrance     Home Layout: One level Home Equipment: Shower seat;Grab bars - toilet;Grab bars - tub/shower;Walker - 2 wheels;Cane - single point;Walker - 4 wheels      Prior Function Level of Independence: Independent         Comments: has equipment for previous family member wiht fracture     Hand Dominance  Dominant Hand: Right    Extremity/Trunk Assessment   Upper Extremity Assessment Upper Extremity Assessment: Overall WFL for tasks assessed    Lower Extremity Assessment Lower Extremity Assessment: RLE deficits/detail RLE Deficits / Details: RBKA, pt educated on importance of knee extension   RLE Coordination: decreased gross motor    Cervical / Trunk Assessment Cervical / Trunk Assessment: Normal  Communication   Communication: No difficulties  Cognition Arousal/Alertness: Awake/alert Behavior During Therapy: WFL for tasks assessed/performed Overall Cognitive Status: Within Functional Limits for tasks assessed                                 General Comments: asking about the use of the brace for RLE      General Comments General comments (skin integrity, edema, etc.): husband was present but left before her session progressed to gait    Exercises     Assessment/Plan    PT Assessment Patient needs continued PT services  PT Problem List Decreased strength;Decreased range of motion;Decreased activity tolerance;Decreased balance;Decreased mobility;Decreased coordination;Decreased knowledge of use of DME;Decreased safety awareness;Decreased knowledge of precautions;Cardiopulmonary status limiting activity;Obesity;Decreased skin integrity;Pain       PT Treatment Interventions DME instruction;Gait training;Functional mobility training;Therapeutic activities;Therapeutic exercise;Balance training;Neuromuscular re-education;Patient/family education    PT Goals (Current goals can be found in the Care Plan section)  Acute Rehab PT Goals Patient Stated Goal: to get home safely PT Goal Formulation: With patient Time For Goal Achievement: 07/23/18 Potential to Achieve Goals: Good    Frequency Min 4X/week   Barriers to discharge Decreased caregiver support;Inaccessible home environment home alone at times and ramped entrance    Co-evaluation               AM-PAC PT "6 Clicks" Mobility  Outcome Measure Help needed turning from your back to your side while in a flat bed without using bedrails?: None Help needed moving from lying on your back to sitting on the side of a flat bed without using bedrails?: A Little Help needed moving to and from a bed to  a chair (including a wheelchair)?: A Little Help needed standing up from a chair using your arms (e.g., wheelchair or bedside chair)?: A Little Help needed to walk in hospital room?: A Lot Help needed climbing 3-5 steps with a railing? : Total 6 Click Score: 16    End of Session Equipment Utilized During Treatment: Gait belt Activity Tolerance: Patient tolerated treatment well;Patient limited by fatigue;Patient limited by pain Patient left: in bed;with call bell/phone within reach;with bed alarm set Nurse Communication: Mobility status PT Visit Diagnosis: Unsteadiness on feet (R26.81);Other abnormalities of gait and mobility (R26.89);Pain Pain - Right/Left: Right Pain - part of body: Leg    Time: 3893-7342 PT Time Calculation (min) (ACUTE ONLY): 39 min   Charges:   PT Evaluation $PT Eval Moderate Complexity: 1 Mod PT Treatments $Gait Training: 8-22 mins $Neuromuscular Re-education: 8-22 mins       Ramond Dial 07/09/2018, 3:46 PM  Mee Hives, PT MS Acute Rehab Dept. Number: [place] and [place]

## 2018-07-09 NOTE — Progress Notes (Signed)
Subjective: 1 Day Post-Op Procedure(s) (LRB): RIGHT BELOW KNEE AMPUTATION (Right) Patient reports pain as moderate. Pain control much better after change to Dilaudid.     Objective: Vital signs in last 24 hours: Temp:  [97.4 F (36.3 C)-98.1 F (36.7 C)] 97.5 F (36.4 C) (02/01 0509) Pulse Rate:  [65-100] 71 (02/01 0509) Resp:  [16-21] 17 (02/01 0509) BP: (141-157)/(71-94) 157/72 (02/01 0509) SpO2:  [95 %-100 %] 95 % (02/01 0509)  Intake/Output from previous day: 01/31 0701 - 02/01 0700 In: 960.5 [I.V.:900; IV Piggyback:60.5] Out: 20 [Blood:20] Intake/Output this shift: No intake/output data recorded.  Recent Labs    07/08/18 0432  HGB 14.6   Recent Labs    07/08/18 0432  WBC 10.7*  RBC 4.98  HCT 44.2  PLT 205   Recent Labs    07/08/18 0432  NA 143  K 4.4  CL 108  CO2 27  BUN 16  CREATININE 0.97  GLUCOSE 223*  CALCIUM 9.0   No results for input(s): LABPT, INR in the last 72 hours.  Right transtibial amputation site with VAC dressing in place and functioning well. No drainage in VAC canister. Stump shrinker stocking in place.   Assessment/Plan: 1 Day Post-Op Procedure(s) (LRB): RIGHT BELOW KNEE AMPUTATION (Right) Up with therapy  CIR consulted for possible admit Continue VAC for at least 1 week.     Erlinda Hong, PA-C 07/09/2018, 8:37 AM  [employer] [phone]

## 2018-07-10 LAB — GLUCOSE, CAPILLARY
Glucose-Capillary: 163 mg/dL — ABNORMAL HIGH (ref 70–99)
Glucose-Capillary: 154 mg/dL — ABNORMAL HIGH (ref 70–99)
Glucose-Capillary: 174 mg/dL — ABNORMAL HIGH (ref 70–99)
Glucose-Capillary: 170 mg/dL — ABNORMAL HIGH (ref 70–99)

## 2018-07-10 MED ORDER — SENNA 8.6 MG PO TABS
1.0000 | ORAL_TABLET | Freq: Every day | ORAL | Status: DC
  Administered 2018-07-10 – 2018-07-11 (×2): 8.6 mg via ORAL
  Filled 2018-07-10 (×2): qty 1

## 2018-07-10 NOTE — Progress Notes (Signed)
Patient ID: [name], female   DOB: Feb 10, 1961, 58 y.o.   MRN: [id number]  PROGRESS NOTE    [name]  PPI:[id number] DOB: May 16, 1961 DOA: 07/04/2018 PCP: Chipper Herb, MD   Brief Narrative:  58 year old female with history of COPD, diabetes mellitus type 2, DVT, GERD, hyperlipidemia, hypertension, hypothyroidism, obesity, PAD with prior toe amputations, depression presented on 07/04/2018 due to chronic nonhealing wound of right great toe with purulent drainage, seen by PCP and admitted for right great toe osteomyelitis.  Orthopedics and vascular surgery were consulted.   Assessment & Plan:   Principal Problem:   Osteomyelitis of great toe of right foot (HCC) Active Problems:   Diabetic infection of right foot (HCC)   Hyperlipidemia   SMOKER   Essential hypertension   GERD (gastroesophageal reflux disease)   Chronic obstructive pulmonary disease (HCC)   Obesity (BMI 30-39.9)   Hypothyroidism   Peripheral vascular insufficiency (HCC)   Deep vein thrombosis (DVT) of both lower extremities (HCC)   Intermittent claudication (HCC)   Mild protein-calorie malnutrition (HCC)   Acquired absence of right leg below knee (HCC)   Right great toe osteomyelitis/diabetic foot infection/severe PAD -Vascular surgery and orthopedics following.  No options for revascularization currently.   -Status post transtibial amputation on 07/08/2018 by Dr. Sharol Given with a wound VAC.  Antibiotics have been discontinued.  Currently hemodynamically stable -PT/OT recommending CIR.  Wound care as per orthopedics recommendations.  Diabetes mellitus type 2 with peripheral neuropathy with hyperglycemia -Metformin on hold.  continue Lantus, continue insulin sliding scale coverage.  A1c 8.8.  Essential hypertension -Continue losartan.  Monitor blood pressure  Hyperlipidemia Continue atorvastatin  Hypothyroidism -Continue Synthroid  GERD  -continue PPI  COPD -Stable.  No signs of  bronchospasm  Tobacco abuse -Cessation counseled by prior hospitalist.  Obesity  -Outpatient follow-up  History of DVTs -Apparently patient has been on Coumadin for the last 13 years.  She was also on aspirin and Plavix.  Currently antiplatelets and Coumadin are on hold; will resume only when cleared by orthopedics.   DVT prophylaxis: Lovenox Code Status:  Full  Family Communication: None at bedside Disposition Plan: CIR once bed is available Consultants: Orthopedic/vascular surgery  Procedures: Transtibial amputation and wound VAC placement on 07/08/2018  Antimicrobials: Daptomycin and Zosyn from 07/04/2018-07/08/2018   Subjective: Patient seen and examined at bedside.  States that her pain is much better controlled.  No overnight fever, nausea or vomiting.  Objective: Vitals:   07/09/18 1751 07/09/18 1951 07/09/18 2332 07/10/18 0410  BP: 136/71 (!) 152/64 127/68 (!) 158/80  Pulse: 75 96 81 91  Resp:      Temp:  98.3 F (36.8 C) 98.5 F (36.9 C) 98.3 F (36.8 C)  TempSrc:  Oral Oral Oral  SpO2:  96% 93% 96%  Weight:      Height:        Intake/Output Summary (Last 24 hours) at 07/10/2018 1046 Last data filed at 07/10/2018 0900 Gross per 24 hour  Intake 360 ml  Output 350 ml  Net 10 ml   Filed Weights   07/04/18 1853  Weight: 104.8 kg    Examination:  General exam: No acute distress  respiratory system: Bilateral decreased breath sounds at bases Cardiovascular system: S1 & S2 heard, rate controlled Gastrointestinal system: Abdomen is obese, nondistended, soft and nontender. Normal bowel sounds heard. Extremities: No edema or cyanosis.  Right transtibial amputation with VAC dressing in place.    Data Reviewed: I have personally reviewed  following labs and imaging studies  CBC: Recent Labs  Lab 07/04/18 1209 07/04/18 1641 07/08/18 0432  WBC 10.9* 11.1* 10.7*  NEUTROABS 5.5 5.7 5.1  HGB 15.3* 14.7 14.6  HCT 48.8* 45.7 44.2  MCV 90.7 88.6 88.8  PLT 231  236 267   Basic Metabolic Panel: Recent Labs  Lab 07/04/18 1209 07/04/18 1641 07/08/18 0432  NA 139 137 143  K 4.2 3.8 4.4  CL 98 98 108  CO2 26 28 27   GLUCOSE 203* 157* 223*  BUN 11 12 16   CREATININE 0.78 0.75 0.97  CALCIUM 9.6 9.3 9.0  MG  --   --  1.7   GFR: Estimated Creatinine Clearance: 85.9 mL/min (by C-G formula based on SCr of 0.97 mg/dL). Liver Function Tests: Recent Labs  Lab 07/04/18 1209 07/04/18 1641  AST 20 20  ALT 22 19  ALKPHOS 57 48  BILITOT 0.3 0.5  PROT 7.8 7.2  ALBUMIN 3.5 3.2*   No results for input(s): LIPASE, AMYLASE in the last 168 hours. No results for input(s): AMMONIA in the last 168 hours. Coagulation Profile: Recent Labs  Lab 07/04/18 1209 07/04/18 1641  INR 1.52 1.54   Cardiac Enzymes: Recent Labs  Lab 07/05/18 0213  CKTOTAL 87   BNP (last 3 results) No results for input(s): PROBNP in the last 8760 hours. HbA1C: No results for input(s): HGBA1C in the last 72 hours. CBG: Recent Labs  Lab 07/09/18 1212 07/09/18 1644 07/09/18 2104 07/09/18 2206 07/10/18 0835  GLUCAP 148* 134* 179* 167* 163*   Lipid Profile: No results for input(s): CHOL, HDL, LDLCALC, TRIG, CHOLHDL, LDLDIRECT in the last 72 hours. Thyroid Function Tests: No results for input(s): TSH, T4TOTAL, FREET4, T3FREE, THYROIDAB in the last 72 hours. Anemia Panel: No results for input(s): VITAMINB12, FOLATE, FERRITIN, TIBC, IRON, RETICCTPCT in the last 72 hours. Sepsis Labs: Recent Labs  Lab 07/04/18 1209 07/04/18 1641 07/04/18 1701 07/04/18 2340  PROCALCITON  --  <0.10  --   --   LATICACIDVEN 5.7*  --  1.9 1.7    Recent Results (from the past 240 hour(s))  Culture, blood (Routine x 2)     Status: None   Collection Time: 07/04/18 12:09 PM  Result Value Ref Range Status   Specimen Description BLOOD LEFT HAND  Final   Special Requests   Final    BOTTLES DRAWN AEROBIC ONLY Blood Culture adequate volume Performed at [facility], [address], [place], [place] [address]    Culture NO GROWTH 5 DAYS  Final   Report Status 07/09/2018 FINAL  Final  Culture, blood (Routine x 2)     Status: None   Collection Time: 07/04/18  5:20 PM  Result Value Ref Range Status   Specimen Description BLOOD RIGHT ANTECUBITAL  Final   Special Requests   Final    BOTTLES DRAWN AEROBIC AND ANAEROBIC Blood Culture adequate volume Performed at [facility], [place] [address] [place], [place] [address]    Culture NO GROWTH 5 DAYS  Final   Report Status 07/09/2018 FINAL  Final  Surgical pcr screen     Status: None   Collection Time: 07/08/18  7:44 AM  Result Value Ref Range Status   MRSA, PCR NEGATIVE NEGATIVE Final   Staphylococcus aureus NEGATIVE NEGATIVE Final    Comment: (NOTE) The Xpert SA Assay (FDA approved for NASAL specimens in patients 33 years of age and older), is one component of a comprehensive surveillance program. It is not  intended to diagnose infection nor to guide or monitor treatment. Performed at [facility], [place] [address], [place], [place] [address]          Radiology Studies: No results found.      Scheduled Meds: . atorvastatin  40 mg Oral Daily  . bisacodyl  5 mg Oral Once  . cholecalciferol  5,000 Units Oral Daily  . docusate sodium  100 mg Oral BID  . enoxaparin (LOVENOX) injection  40 mg Subcutaneous Q24H  . escitalopram  30 mg Oral Daily  . furosemide  20 mg Oral Daily  . insulin aspart  0-15 Units Subcutaneous TID WC  . insulin aspart  0-5 Units Subcutaneous QHS  . insulin glargine  20 Units Subcutaneous QHS  . levothyroxine  100 mcg Oral Daily  . losartan  50 mg Oral Daily  . mupirocin ointment  1 application Topical BID  . nicotine  21 mg Transdermal Daily  . pantoprazole  40 mg Oral Daily  . umeclidinium bromide  1 puff Inhalation Daily   Continuous Infusions: . lactated ringers 10 mL/hr at 07/09/18 1431     LOS: 6 days        Aline August, MD [facility] Pager  [phone]  If 7PM-7AM, please contact night-coverage [email or web address] Password [facility] 07/10/2018, 10:46 AM

## 2018-07-11 ENCOUNTER — Other Ambulatory Visit: Payer: Self-pay

## 2018-07-11 ENCOUNTER — Encounter (HOSPITAL_COMMUNITY): Payer: Self-pay | Admitting: *Deleted

## 2018-07-11 ENCOUNTER — Inpatient Hospital Stay (HOSPITAL_COMMUNITY)
Admission: RE | Admit: 2018-07-11 | Discharge: 2018-07-16 | DRG: 560 | Disposition: A | Discharge status: Home Health | Payer: Medicare Other | Source: Intra-hospital | Attending: Physical Medicine & Rehabilitation | Admitting: Physical Medicine & Rehabilitation

## 2018-07-11 DIAGNOSIS — E669 Obesity, unspecified: Secondary | ICD-10-CM | POA: Diagnosis not present

## 2018-07-11 DIAGNOSIS — Z7982 Long term (current) use of aspirin: Secondary | ICD-10-CM

## 2018-07-11 DIAGNOSIS — I10 Essential (primary) hypertension: Secondary | ICD-10-CM | POA: Diagnosis present

## 2018-07-11 DIAGNOSIS — S88111A Complete traumatic amputation at level between knee and ankle, right lower leg, initial encounter: Secondary | ICD-10-CM

## 2018-07-11 DIAGNOSIS — D62 Acute posthemorrhagic anemia: Secondary | ICD-10-CM | POA: Diagnosis present

## 2018-07-11 DIAGNOSIS — Z7902 Long term (current) use of antithrombotics/antiplatelets: Secondary | ICD-10-CM | POA: Diagnosis not present

## 2018-07-11 DIAGNOSIS — Z8249 Family history of ischemic heart disease and other diseases of the circulatory system: Secondary | ICD-10-CM | POA: Diagnosis not present

## 2018-07-11 DIAGNOSIS — E1151 Type 2 diabetes mellitus with diabetic peripheral angiopathy without gangrene: Secondary | ICD-10-CM | POA: Diagnosis present

## 2018-07-11 DIAGNOSIS — Z91199 Patient's noncompliance with other medical treatment and regimen due to unspecified reason: Secondary | ICD-10-CM

## 2018-07-11 DIAGNOSIS — Z4781 Encounter for orthopedic aftercare following surgical amputation: Secondary | ICD-10-CM | POA: Diagnosis not present

## 2018-07-11 DIAGNOSIS — J449 Chronic obstructive pulmonary disease, unspecified: Secondary | ICD-10-CM | POA: Diagnosis present

## 2018-07-11 DIAGNOSIS — E1140 Type 2 diabetes mellitus with diabetic neuropathy, unspecified: Secondary | ICD-10-CM | POA: Diagnosis present

## 2018-07-11 DIAGNOSIS — E1169 Type 2 diabetes mellitus with other specified complication: Secondary | ICD-10-CM

## 2018-07-11 DIAGNOSIS — R5381 Other malaise: Secondary | ICD-10-CM | POA: Diagnosis present

## 2018-07-11 DIAGNOSIS — Z8542 Personal history of malignant neoplasm of other parts of uterus: Secondary | ICD-10-CM | POA: Diagnosis not present

## 2018-07-11 DIAGNOSIS — Z89511 Acquired absence of right leg below knee: Secondary | ICD-10-CM | POA: Diagnosis not present

## 2018-07-11 DIAGNOSIS — Z8349 Family history of other endocrine, nutritional and metabolic diseases: Secondary | ICD-10-CM | POA: Diagnosis not present

## 2018-07-11 DIAGNOSIS — R791 Abnormal coagulation profile: Secondary | ICD-10-CM

## 2018-07-11 DIAGNOSIS — F17210 Nicotine dependence, cigarettes, uncomplicated: Secondary | ICD-10-CM | POA: Diagnosis present

## 2018-07-11 DIAGNOSIS — I739 Peripheral vascular disease, unspecified: Secondary | ICD-10-CM

## 2018-07-11 DIAGNOSIS — Z800 Family history of malignant neoplasm of digestive organs: Secondary | ICD-10-CM | POA: Diagnosis not present

## 2018-07-11 DIAGNOSIS — Z833 Family history of diabetes mellitus: Secondary | ICD-10-CM | POA: Diagnosis not present

## 2018-07-11 DIAGNOSIS — K219 Gastro-esophageal reflux disease without esophagitis: Secondary | ICD-10-CM | POA: Diagnosis present

## 2018-07-11 DIAGNOSIS — Z86718 Personal history of other venous thrombosis and embolism: Secondary | ICD-10-CM | POA: Diagnosis not present

## 2018-07-11 DIAGNOSIS — E785 Hyperlipidemia, unspecified: Secondary | ICD-10-CM | POA: Diagnosis present

## 2018-07-11 DIAGNOSIS — S88111D Complete traumatic amputation at level between knee and ankle, right lower leg, subsequent encounter: Secondary | ICD-10-CM | POA: Diagnosis not present

## 2018-07-11 DIAGNOSIS — Z6833 Body mass index (BMI) 33.0-33.9, adult: Secondary | ICD-10-CM

## 2018-07-11 DIAGNOSIS — D72829 Elevated white blood cell count, unspecified: Secondary | ICD-10-CM | POA: Diagnosis present

## 2018-07-11 DIAGNOSIS — Z7984 Long term (current) use of oral hypoglycemic drugs: Secondary | ICD-10-CM | POA: Diagnosis not present

## 2018-07-11 DIAGNOSIS — Z79890 Hormone replacement therapy (postmenopausal): Secondary | ICD-10-CM | POA: Diagnosis not present

## 2018-07-11 DIAGNOSIS — F329 Major depressive disorder, single episode, unspecified: Secondary | ICD-10-CM | POA: Diagnosis present

## 2018-07-11 DIAGNOSIS — Z832 Family history of diseases of the blood and blood-forming organs and certain disorders involving the immune mechanism: Secondary | ICD-10-CM | POA: Diagnosis not present

## 2018-07-11 DIAGNOSIS — Z9119 Patient's noncompliance with other medical treatment and regimen: Secondary | ICD-10-CM | POA: Diagnosis not present

## 2018-07-11 DIAGNOSIS — E039 Hypothyroidism, unspecified: Secondary | ICD-10-CM | POA: Diagnosis present

## 2018-07-11 DIAGNOSIS — D72823 Leukemoid reaction: Secondary | ICD-10-CM

## 2018-07-11 HISTORY — DX: Complete traumatic amputation at level between knee and ankle, right lower leg, initial encounter: S88.111A

## 2018-07-11 LAB — GLUCOSE, CAPILLARY
GLUCOSE-CAPILLARY: 213 mg/dL — AB (ref 70–99)
Glucose-Capillary: 162 mg/dL — ABNORMAL HIGH (ref 70–99)
Glucose-Capillary: 164 mg/dL — ABNORMAL HIGH (ref 70–99)
Glucose-Capillary: 165 mg/dL — ABNORMAL HIGH (ref 70–99)

## 2018-07-11 MED ORDER — GUAIFENESIN-DM 100-10 MG/5ML PO SYRP: 5.0000 mL | ORAL_SOLUTION | Freq: Four times a day (QID) | ORAL | Status: DC | PRN

## 2018-07-11 MED ORDER — PROCHLORPERAZINE MALEATE 5 MG PO TABS: 5.0000 mg | ORAL_TABLET | Freq: Four times a day (QID) | ORAL | Status: DC | PRN

## 2018-07-11 MED ORDER — OXYCODONE HCL 5 MG PO TABS
5.0000 mg | ORAL_TABLET | ORAL | Status: DC | PRN
  Administered 2018-07-11 – 2018-07-16 (×9): 10 mg via ORAL
  Filled 2018-07-11 (×11): qty 2

## 2018-07-11 MED ORDER — INSULIN ASPART 100 UNIT/ML ~~LOC~~ SOLN
0.0000 [IU] | Freq: Every day | SUBCUTANEOUS | Status: DC
  Administered 2018-07-11: 2 [IU] via SUBCUTANEOUS
  Administered 2018-07-13: 3 [IU] via SUBCUTANEOUS

## 2018-07-11 MED ORDER — WARFARIN - PHYSICIAN DOSING INPATIENT: Freq: Every day | Status: DC

## 2018-07-11 MED ORDER — PROCHLORPERAZINE 25 MG RE SUPP: 12.5000 mg | Freq: Four times a day (QID) | RECTAL | Status: DC | PRN

## 2018-07-11 MED ORDER — NICOTINE 21 MG/24HR TD PT24
21.0000 mg | MEDICATED_PATCH | Freq: Every day | TRANSDERMAL | Status: DC
  Administered 2018-07-12 – 2018-07-16 (×5): 21 mg via TRANSDERMAL
  Filled 2018-07-11 (×5): qty 1

## 2018-07-11 MED ORDER — LOSARTAN POTASSIUM 50 MG PO TABS
50.0000 mg | ORAL_TABLET | Freq: Every day | ORAL | Status: DC
  Administered 2018-07-12 – 2018-07-16 (×5): 50 mg via ORAL
  Filled 2018-07-11 (×5): qty 1

## 2018-07-11 MED ORDER — CLOPIDOGREL BISULFATE 75 MG PO TABS
75.0000 mg | ORAL_TABLET | Freq: Every day | ORAL | Status: DC
  Administered 2018-07-12 – 2018-07-16 (×5): 75 mg via ORAL
  Filled 2018-07-11 (×5): qty 1

## 2018-07-11 MED ORDER — INSULIN ASPART 100 UNIT/ML ~~LOC~~ SOLN: 0.0000 [IU] | Freq: Three times a day (TID) | SUBCUTANEOUS | Status: DC

## 2018-07-11 MED ORDER — POLYETHYLENE GLYCOL 3350 17 G PO PACK
17.0000 g | PACK | Freq: Every day | ORAL | Status: DC | PRN
  Administered 2018-07-11: 17 g via ORAL
  Filled 2018-07-11: qty 1

## 2018-07-11 MED ORDER — INSULIN GLARGINE 100 UNIT/ML ~~LOC~~ SOLN
20.0000 [IU] | Freq: Every day | SUBCUTANEOUS | Status: DC
  Administered 2018-07-11 – 2018-07-13 (×3): 20 [IU] via SUBCUTANEOUS
  Filled 2018-07-11 (×3): qty 0.2

## 2018-07-11 MED ORDER — DIPHENHYDRAMINE HCL 12.5 MG/5ML PO ELIX: 12.5000 mg | ORAL_SOLUTION | Freq: Four times a day (QID) | ORAL | Status: DC | PRN

## 2018-07-11 MED ORDER — VITAMIN D 25 MCG (1000 UNIT) PO TABS
5000.0000 [IU] | ORAL_TABLET | Freq: Every day | ORAL | Status: DC
  Administered 2018-07-12 – 2018-07-16 (×5): 5000 [IU] via ORAL
  Filled 2018-07-11 (×5): qty 5

## 2018-07-11 MED ORDER — ALBUTEROL SULFATE (2.5 MG/3ML) 0.083% IN NEBU: 3.0000 mL | INHALATION_SOLUTION | Freq: Four times a day (QID) | RESPIRATORY_TRACT | Status: DC | PRN

## 2018-07-11 MED ORDER — ACETAMINOPHEN 325 MG PO TABS
325.0000 mg | ORAL_TABLET | ORAL | Status: DC | PRN
  Administered 2018-07-13: 650 mg via ORAL
  Filled 2018-07-11 (×2): qty 2

## 2018-07-11 MED ORDER — INSULIN ASPART 100 UNIT/ML ~~LOC~~ SOLN
0.0000 [IU] | Freq: Three times a day (TID) | SUBCUTANEOUS | Status: DC
  Administered 2018-07-12: 3 [IU] via SUBCUTANEOUS
  Administered 2018-07-12 – 2018-07-13 (×3): 2 [IU] via SUBCUTANEOUS
  Administered 2018-07-13 – 2018-07-14 (×3): 3 [IU] via SUBCUTANEOUS
  Administered 2018-07-14: 2 [IU] via SUBCUTANEOUS
  Administered 2018-07-14 – 2018-07-16 (×4): 3 [IU] via SUBCUTANEOUS

## 2018-07-11 MED ORDER — INSULIN ASPART 100 UNIT/ML ~~LOC~~ SOLN: 0.0000 [IU] | Freq: Every day | SUBCUTANEOUS | Status: DC

## 2018-07-11 MED ORDER — WARFARIN SODIUM 5 MG PO TABS: 5.0000 mg | ORAL_TABLET | Freq: Once | ORAL | Status: DC

## 2018-07-11 MED ORDER — ASPIRIN 325 MG PO TABS
325.0000 mg | ORAL_TABLET | Freq: Every day | ORAL | Status: DC
  Administered 2018-07-12 – 2018-07-13 (×2): 325 mg via ORAL
  Filled 2018-07-11 (×2): qty 1

## 2018-07-11 MED ORDER — BISACODYL 10 MG RE SUPP: 10.0000 mg | Freq: Every day | RECTAL | Status: DC | PRN

## 2018-07-11 MED ORDER — WARFARIN - PHYSICIAN DOSING INPATIENT
Freq: Every day | Status: DC
  Administered 2018-07-11: 17:00:00

## 2018-07-11 MED ORDER — ATORVASTATIN CALCIUM 40 MG PO TABS
40.0000 mg | ORAL_TABLET | Freq: Every day | ORAL | Status: DC
  Administered 2018-07-12 – 2018-07-16 (×5): 40 mg via ORAL
  Filled 2018-07-11 (×5): qty 1

## 2018-07-11 MED ORDER — CLOPIDOGREL BISULFATE 75 MG PO TABS
75.0000 mg | ORAL_TABLET | Freq: Every day | ORAL | Status: DC
  Administered 2018-07-11: 75 mg via ORAL
  Filled 2018-07-11: qty 1

## 2018-07-11 MED ORDER — ALUM & MAG HYDROXIDE-SIMETH 200-200-20 MG/5ML PO SUSP
30.0000 mL | ORAL | Status: DC | PRN
  Administered 2018-07-12 – 2018-07-16 (×7): 30 mL via ORAL
  Filled 2018-07-11 (×7): qty 30

## 2018-07-11 MED ORDER — WARFARIN SODIUM 5 MG PO TABS: 5.0000 mg | ORAL_TABLET | Freq: Every day | ORAL | Status: DC

## 2018-07-11 MED ORDER — METHOCARBAMOL 500 MG PO TABS
500.0000 mg | ORAL_TABLET | Freq: Four times a day (QID) | ORAL | Status: DC | PRN
  Administered 2018-07-13 (×2): 500 mg via ORAL
  Filled 2018-07-11 (×4): qty 1

## 2018-07-11 MED ORDER — ASPIRIN 325 MG PO TABS
325.0000 mg | ORAL_TABLET | Freq: Every day | ORAL | Status: DC
  Administered 2018-07-11: 325 mg via ORAL
  Filled 2018-07-11: qty 1

## 2018-07-11 MED ORDER — WARFARIN SODIUM 5 MG PO TABS
5.0000 mg | ORAL_TABLET | Freq: Once | ORAL | Status: DC
  Administered 2018-07-11: 5 mg via ORAL
  Filled 2018-07-11: qty 1

## 2018-07-11 MED ORDER — PROCHLORPERAZINE EDISYLATE 10 MG/2ML IJ SOLN: 5.0000 mg | Freq: Four times a day (QID) | INTRAMUSCULAR | Status: DC | PRN

## 2018-07-11 MED ORDER — PANTOPRAZOLE SODIUM 40 MG PO TBEC
40.0000 mg | DELAYED_RELEASE_TABLET | Freq: Every day | ORAL | Status: DC
  Administered 2018-07-12 – 2018-07-16 (×5): 40 mg via ORAL
  Filled 2018-07-11 (×5): qty 1

## 2018-07-11 MED ORDER — UMECLIDINIUM BROMIDE 62.5 MCG/INH IN AEPB: 1.0000 | INHALATION_SPRAY | Freq: Every day | RESPIRATORY_TRACT | Status: DC

## 2018-07-11 MED ORDER — FUROSEMIDE 20 MG PO TABS
20.0000 mg | ORAL_TABLET | Freq: Every day | ORAL | Status: DC
  Administered 2018-07-12 – 2018-07-16 (×5): 20 mg via ORAL
  Filled 2018-07-11 (×5): qty 1

## 2018-07-11 MED ORDER — OXYCODONE HCL 5 MG PO TABS: 5.0000 mg | ORAL_TABLET | ORAL | Status: DC | PRN

## 2018-07-11 MED ORDER — TRAMADOL HCL 50 MG PO TABS
50.0000 mg | ORAL_TABLET | Freq: Four times a day (QID) | ORAL | Status: DC | PRN
  Administered 2018-07-13 – 2018-07-14 (×2): 50 mg via ORAL
  Filled 2018-07-11 (×4): qty 1

## 2018-07-11 MED ORDER — FLEET ENEMA 7-19 GM/118ML RE ENEM: 1.0000 | ENEMA | Freq: Once | RECTAL | Status: DC | PRN

## 2018-07-11 MED ORDER — INSULIN GLARGINE 100 UNIT/ML ~~LOC~~ SOLN: 20.0000 [IU] | Freq: Every day | SUBCUTANEOUS | Status: DC

## 2018-07-11 MED ORDER — SENNA 8.6 MG PO TABS: 1.0000 | ORAL_TABLET | Freq: Two times a day (BID) | ORAL | Status: DC

## 2018-07-11 MED ORDER — POLYETHYLENE GLYCOL 3350 17 G PO PACK
17.0000 g | PACK | Freq: Every day | ORAL | Status: DC | PRN
  Administered 2018-07-12 – 2018-07-13 (×2): 17 g via ORAL
  Filled 2018-07-11 (×2): qty 1

## 2018-07-11 MED ORDER — SENNA 8.6 MG PO TABS
1.0000 | ORAL_TABLET | Freq: Every day | ORAL | Status: DC
  Administered 2018-07-12 – 2018-07-16 (×5): 8.6 mg via ORAL
  Filled 2018-07-11 (×5): qty 1

## 2018-07-11 MED ORDER — LEVOTHYROXINE SODIUM 100 MCG PO TABS
100.0000 ug | ORAL_TABLET | Freq: Every day | ORAL | Status: DC
  Administered 2018-07-12 – 2018-07-16 (×5): 100 ug via ORAL
  Filled 2018-07-11 (×5): qty 1

## 2018-07-11 MED ORDER — POLYETHYLENE GLYCOL 3350 17 G PO PACK: 17.0000 g | PACK | Freq: Every day | ORAL | Status: DC | PRN

## 2018-07-11 MED ORDER — TRAZODONE HCL 50 MG PO TABS
25.0000 mg | ORAL_TABLET | Freq: Every evening | ORAL | Status: DC | PRN
  Administered 2018-07-13 – 2018-07-14 (×2): 50 mg via ORAL
  Filled 2018-07-11 (×2): qty 1

## 2018-07-11 NOTE — Progress Notes (Signed)
Physical Therapy Treatment Patient Details Name: [name] MRN: [id number] DOB: [birth year] Today's Date: 07/11/2018    History of Present Illness 58 year old female with history of COPD, diabetes mellitus type 2, DVT, GERD, hyperlipidemia, hypertension, hypothyroidism, obesity, PAD with prior toe amputations, depression presented on 07/04/2018 due to chronic nonhealing wound of right great toe with purulent drainage, seen by PCP and admitted for right great toe osteomyelitis. S/P Right below knee amputation 1/31.     PT Comments    Pt seated in recliner finishing OT session.  Pt is slow and guarded and fatigues quickly.  She is adjusting to her limitations and lacks UE strength to move functionally in an independent manner.  Pt continues to be an excellent candidate for aggressive CIR therapies to improve strength and function and return home in an independent state.  Pt is eager for rehabilitation and open to admission to CIR.  Will continue PT per POC.    Follow Up Recommendations  CIR     Equipment Recommendations  Rolling walker with 5" wheels;3in1 (PT)    Recommendations for Other Services Rehab consult     Precautions / Restrictions Precautions Precautions: Fall Restrictions Weight Bearing Restrictions: No(no orders in chart assume NWB.  will f/u with ortho.  ) Other Position/Activity Restrictions: R limb guard to be worn when up in standing or when ambulating per pt, no order in chart.      Mobility  Bed Mobility Overal bed mobility: Modified Independent             General bed mobility comments: used bed rail to assist pivot to side of bed and back to bed  Transfers Overall transfer level: Needs assistance Equipment used: Rolling walker (2 wheeled) Transfers: Sit to/from Stand Sit to Stand: Min guard Stand pivot transfers: Min guard       General transfer comment: Pt required cues for hand placement to and from seated surface to achieve standing.   Pt can improve eccentric loading as she lacks arm strength to control return to seated surface when sitting.    Ambulation/Gait Ambulation/Gait assistance: Min assist;+2 safety/equipment(for close chair follow. ) Gait Distance (Feet): 8 Feet(x2.  Fatigues quickly due to UE arm weakness.  ) Assistive device: Rolling walker (2 wheeled) Gait Pattern/deviations: Step-to pattern;Trunk flexed;Antalgic(hop to pattern) Gait velocity: reduced   General Gait Details: Pt required adjustment to RW height to improve use of B UEs.  She presents with jarring hopping pattern.  Cues to stay close to RW for adequate support.  close chair follow.     Stairs             Wheelchair Mobility    Modified Rankin (Stroke Patients Only)       Balance Overall balance assessment: Needs assistance Sitting-balance support: Feet supported Sitting balance-Leahy Scale: Good       Standing balance-Leahy Scale: Poor Standing balance comment: Heavily reliant on UE support.                              Cognition Arousal/Alertness: Awake/alert Behavior During Therapy: WFL for tasks assessed/performed Overall Cognitive Status: Within Functional Limits for tasks assessed                                        Exercises Other Exercises Other Exercises: Pt fatigued from back to back session  as OT completed tx as PT was entering room.  Educated on hip extension in sidelying and standing.  Educated on knee flexion and extension when brace is removed.  Pt educated to keep limb guard intact when resting.      General Comments        Pertinent Vitals/Pain Pain Assessment: Faces Pain Score: 7  Pain Location: R lower leg and B shoudlers Pain Descriptors / Indicators: Operative site guarding Pain Intervention(s): Monitored during session;Repositioned    Home Living                      Prior Function            PT Goals (current goals can now be found in the care  plan section) Acute Rehab PT Goals Patient Stated Goal: to get home safely Potential to Achieve Goals: Good Progress towards PT goals: Progressing toward goals    Frequency    Min 4X/week      PT Plan Current plan remains appropriate    Co-evaluation              AM-PAC PT "6 Clicks" Mobility   Outcome Measure  Help needed turning from your back to your side while in a flat bed without using bedrails?: None Help needed moving from lying on your back to sitting on the side of a flat bed without using bedrails?: A Little Help needed moving to and from a bed to a chair (including a wheelchair)?: A Little Help needed standing up from a chair using your arms (e.g., wheelchair or bedside chair)?: A Little Help needed to walk in hospital room?: A Lot Help needed climbing 3-5 steps with a railing? : Total 6 Click Score: 16    End of Session Equipment Utilized During Treatment: Gait belt Activity Tolerance: Patient tolerated treatment well;Patient limited by fatigue;Patient limited by pain Patient left: in bed;with call bell/phone within reach;with bed alarm set Nurse Communication: Mobility status PT Visit Diagnosis: Unsteadiness on feet (R26.81);Other abnormalities of gait and mobility (R26.89);Pain Pain - Right/Left: Right Pain - part of body: Leg     Time: 0626-9485 PT Time Calculation (min) (ACUTE ONLY): 23 min  Charges:  $Gait Training: 23-37 mins                     Governor Rooks, PTA Acute Rehabilitation Services Pager [phone] Office [phone]     Judea Riches Eli Hose 07/11/2018, 11:49 AM

## 2018-07-11 NOTE — H&P (Signed)
Physical Medicine and Rehabilitation Admission H&P    Chief Complaint  Patient presents with  . R-BKA with functional decline.     HPI:  [name] is a 58 year old female with history of T2DM with neuropathy, DVT, HTN, COPD, tobacco abuse, PVD with with multiple revascularization who was admitted via MD office on 07/05/18 with osteromyelitis right great toe. She was started on IV antibiotics and ABI showed evidence of severe PAD.  Dr. Scot Dock consulted and recommended amputation as patient as veins flet to be unconstructable.  She was evaluated by Dr. Sharol Given and underwent R-BKA on 01/31.  Post op pain control improving. Follow up labs showed persistent leucocytosis        Review of Systems  Constitutional: Negative for chills and fever.  HENT: Negative for hearing loss and tinnitus.   Eyes: Negative for blurred vision and double vision.  Respiratory: Negative for cough and shortness of breath.   Cardiovascular: Negative for chest pain and palpitations.  Gastrointestinal: Positive for constipation. Negative for heartburn and nausea.  Genitourinary: Negative for dysuria and frequency.  Musculoskeletal: Negative for joint pain and myalgias.  Skin: Positive for itching. Negative for rash.  Neurological: Positive for dizziness and sensory change (RLE).  Psychiatric/Behavioral: Negative for memory loss. The patient does not have insomnia.      Past Medical History:  Diagnosis Date  . COPD (chronic obstructive pulmonary disease) ([place])   . Depression   . DM (diabetes mellitus) ([place])   . DVT (deep venous thrombosis) ([facility])    x5  . Family history of colon cancer   . GERD (gastroesophageal reflux disease)   . Hyperlipemia   . Hypertension   . Hypothyroid   . Obesity   . PVD (peripheral vascular disease) ([place])   . Uterine cancer ([place])   . UTI (urinary tract infection)    Past Surgical History:  Procedure Laterality Date  . ABDOMINAL HYSTERECTOMY    . CESAREAN SECTION     . FEMORAL BYPASS     x 5  . LUMBAR DISC SURGERY     L4-L5  . TOE AMPUTATION     right  . TONSILLECTOMY      Family History  Problem Relation Age of Onset  . Colon cancer Brother 84  . Hypertension Mother   . Hypothyroidism Mother   . Heart disease Father   . Diabetes Father   . Hyperlipidemia Father   . Clotting disorder Brother   . Diabetes Brother   . Heart disease Brother     Social History:  Married. Independent without AD PTA. She reports that she has been smoking cigarettes--1 PPD. She has a 19.00 pack-year smoking history. She has never used smokeless tobacco. She reports that she does not drink alcohol or use drugs.    Allergies  Allergen Reactions  . Aleve [Naproxen Sodium] Hives and Itching  . Cortisone Swelling    Internal organs  . Prednisone Swelling    Makes internal organs swell  . Vancomycin Itching   Medications Prior to Admission  Medication Sig Dispense Refill  . albuterol (PROVENTIL HFA;VENTOLIN HFA) 108 (90 Base) MCG/ACT inhaler Inhale 2 puffs into the lungs every 6 (six) hours as needed for wheezing or shortness of breath. 1 Inhaler 11  . aspirin 325 MG tablet Take 325 mg by mouth daily.    Marland Kitchen atorvastatin (LIPITOR) 40 MG tablet TAKE 1 TABLET DAILY (Patient taking differently: Take 40 mg by mouth daily. ) 90 tablet 0  .  Cholecalciferol (VITAMIN D3) 5000 units TABS Take 1 tablet by mouth daily.    . clopidogrel (PLAVIX) 75 MG tablet TAKE 1 TABLET DAILY (Patient taking differently: Take 75 mg by mouth once. ) 90 tablet 0  . escitalopram (LEXAPRO) 20 MG tablet TAKE 1 & 1/2 TABLETS ONCE DAILY (Patient taking differently: 30 mg daily. ) 135 tablet 0  . furosemide (LASIX) 20 MG tablet TAKE 1 TABLET DAILY (Patient taking differently: Take 20 mg by mouth daily. ) 90 tablet 0  . levothyroxine (SYNTHROID, LEVOTHROID) 100 MCG tablet TAKE (1) TABLET DAILY BE- FORE BREAKFAST. (Patient taking differently: Take 100 mcg by mouth daily. ) 90 tablet 0  . losartan  (COZAAR) 50 MG tablet TAKE 1 TABLET DAILY (Patient taking differently: Take 50 mg by mouth daily. ) 90 tablet 0  . metFORMIN (GLUCOPHAGE-XR) 500 MG 24 hr tablet TAKE 2 TABLET ONCE DAILY WITH BREAKFAST (Patient taking differently: Take 1,000 mg by mouth daily with breakfast. ) 180 tablet 3  . mupirocin ointment (BACTROBAN) 2 % APPLY TO THE NOSE AS DIRECTED 2 TIMES A DAY (Patient taking differently: Apply 1 application topically 2 (two) times daily. To nose as directed.) 22 g 1  . omeprazole (PRILOSEC) 20 MG capsule TAKE (1) CAPSULE DAILY (Patient taking differently: Take 20 mg by mouth daily. ) 90 capsule 0  . Potassium Gluconate 595 MG CAPS Take 1 capsule by mouth daily.    Marland Kitchen tretinoin (RETIN-A) 0.05 % cream APPLY TO AFFECTED AREAS AT BEDTIME (Patient taking differently: Apply 1 application topically at bedtime. To affected areas.) 20 g 0  . umeclidinium bromide (INCRUSE ELLIPTA) 62.5 MCG/INH AEPB Inhale 1 puff into the lungs daily. 30 each 3  . [DISCONTINUED] warfarin (COUMADIN) 5 MG tablet TAKE AS DIRECTED UP TO 1 & 1/2 TABLETS A DAY (Patient taking differently: Take 5-7.5 mg by mouth See admin instructions. Take as directed up to 7.5mg  daily) 135 tablet 0  . hydrOXYzine (ATARAX/VISTARIL) 10 MG tablet Take 1 tablet (10 mg total) by mouth 3 (three) times daily as needed. 30 tablet 0  . ONE TOUCH ULTRA TEST test strip TEST BLOOD SUGARS TWICE A DAY 50 each 2    Drug Regimen Review  Drug regimen was reviewed and remains appropriate with no significant issues identified  Home: Home Living Family/patient expects to be discharged to:: Private residence Living Arrangements: Spouse/significant other Available Help at Discharge: Family, Available PRN/intermittently, Available 24 hours/day, Friend(s) Type of Home: House Home Access: Stairs to enter, Ramped entrance Home Layout: One level Bathroom Shower/Tub: Multimedia programmer: Standard Bathroom Accessibility: Yes Home Equipment: Estate manager/land agent, Grab bars - toilet, Grab bars - tub/shower, Environmental consultant - 2 wheels, Cane - single point, Environmental consultant - 4 wheels   Functional History: Prior Function Level of Independence: Independent Comments: has equipment for previous family member wiht fracture  Functional Status:  Mobility: Bed Mobility Overal bed mobility: Modified Independent General bed mobility comments: pt sitting in recliner upon arrival Transfers Overall transfer level: Needs assistance Equipment used: Rolling walker (2 wheeled) Transfers: Sit to/from Stand, [employer] Transfers Sit to Stand: Min guard Stand pivot transfers: Min guard General transfer comment: pt required vc to descend with control Ambulation/Gait Ambulation/Gait assistance: Min assist, +2 safety/equipment(for close chair follow. ) Gait Distance (Feet): 8 Feet(x2.  Fatigues quickly due to UE arm weakness.  ) Assistive device: Rolling walker (2 wheeled) Gait Pattern/deviations: Step-to pattern, Trunk flexed, Antalgic(hop to pattern) General Gait Details: Pt required adjustment to RW height to improve  use of B UEs.  She presents with jarring hopping pattern.  Cues to stay close to RW for adequate support.  close chair follow.   Gait velocity: reduced Gait velocity interpretation: <1.8 ft/sec, indicate of risk for recurrent falls    ADL: ADL Overall ADL's : Needs assistance/impaired Eating/Feeding: Independent Grooming: Set up, Sitting Upper Body Bathing: Set up, Sitting Lower Body Bathing: Set up, Sitting/lateral leans Upper Body Dressing : Supervision/safety, Sitting Lower Body Dressing: Min guard, Sit to/from stand Lower Body Dressing Details (indicate cue type and reason): pt with reports of slight dizziness secondary to pain medication;minguard for stability while standing;mod I with return demonstration application of RLE brace Toilet Transfer: Min guard, Buyer, retail Details (indicate cue type and reason): min guard for stand<>pivot  transfer from EOB to [facility]; Toileting- Water quality scientist and Hygiene: Min guard, Sit to/from stand Functional mobility during ADLs: Min guard, Minimal assistance, Rolling walker General ADL Comments: min guard for initial stability with use of RW;minA for functional mobility reaching outside base of support with single UE support on RW and minA for steps backwards to recliner  Cognition: Cognition Overall Cognitive Status: Within Functional Limits for tasks assessed Orientation Level: Oriented X4 Cognition Arousal/Alertness: Awake/alert Behavior During Therapy: WFL for tasks assessed/performed Overall Cognitive Status: Within Functional Limits for tasks assessed General Comments: able to return demonstrate application of RLE brace   Blood pressure 133/68, pulse 74, temperature 98.5 F (36.9 C), temperature source Oral, resp. rate 18, height 5\' 10"  (1.778 m), weight 104.8 kg, SpO2 95 %. Physical Exam  Constitutional: No distress.  obese  HENT:  Head: Normocephalic and atraumatic.  Eyes: Pupils are equal, round, and reactive to light. EOM are normal.  Neck: Normal range of motion. No tracheal deviation present. No thyromegaly present.  Cardiovascular: Normal rate and regular rhythm.  Respiratory: Effort normal. No respiratory distress. She has no wheezes. She has no rales.  GI: Soft. She exhibits no distension. There is no abdominal tenderness.  Musculoskeletal:     Comments: Compressive dressing with wound VAC on L-BKA  Neurological:  UE 5/5. RLE limited by pain/vac, can lift leg off bed, can flex/extend knee against gravity. LLE 4-5/5. No sensory changes  Skin: Skin is warm and dry.  Multiple scars due to healed lesion under chin, BUE/BLE and upper chest. LLE with multiple scabbed lesions.   Psychiatric: She has a normal mood and affect. Her behavior is normal.    Results for orders placed or performed during the hospital encounter of 07/04/18 (from the past 48 hour(s))    Glucose, capillary     Status: Abnormal   Collection Time: 07/09/18  4:44 PM  Result Value Ref Range   Glucose-Capillary 134 (H) 70 - 99 mg/dL  Glucose, capillary     Status: Abnormal   Collection Time: 07/09/18  9:04 PM  Result Value Ref Range   Glucose-Capillary 179 (H) 70 - 99 mg/dL  Glucose, capillary     Status: Abnormal   Collection Time: 07/09/18 10:06 PM  Result Value Ref Range   Glucose-Capillary 167 (H) 70 - 99 mg/dL  Glucose, capillary     Status: Abnormal   Collection Time: 07/10/18  8:35 AM  Result Value Ref Range   Glucose-Capillary 163 (H) 70 - 99 mg/dL  Glucose, capillary     Status: Abnormal   Collection Time: 07/10/18  1:30 PM  Result Value Ref Range   Glucose-Capillary 154 (H) 70 - 99 mg/dL  Glucose, capillary  Status: Abnormal   Collection Time: 07/10/18  5:51 PM  Result Value Ref Range   Glucose-Capillary 174 (H) 70 - 99 mg/dL  Glucose, capillary     Status: Abnormal   Collection Time: 07/10/18  9:17 PM  Result Value Ref Range   Glucose-Capillary 170 (H) 70 - 99 mg/dL  Glucose, capillary     Status: Abnormal   Collection Time: 07/11/18  7:58 AM  Result Value Ref Range   Glucose-Capillary 162 (H) 70 - 99 mg/dL  Glucose, capillary     Status: Abnormal   Collection Time: 07/11/18 11:33 AM  Result Value Ref Range   Glucose-Capillary 164 (H) 70 - 99 mg/dL   No results found.     Medical Problem List and Plan: 1.  Functional and mobility deficits secondary to PAD, right BKA  -admit to inpatient rehab 2.  H/o BLE/DVT/Anticoagulation: Pharmaceutical: Coumadin 3. Pain Management: Oxycodone prn is effective.  4. Mood: LCSW to follow for evaluation and support.  5. Neuropsych: This patient is capable of making decisions on her own behalf. 6. Skin/Wound Care: Continue compressive dressing with prevena VAC on R-BKA.   -remove dressing post-op day 7 7. Fluids/Electrolytes/Nutrition: Monitor I/O --check lytes in am.  8. HTN: Monitor BP bid. Continue  lasix and cozaar.  9. COPD: Encourage tobacco cessation. Continue Incruse daily.  10. T2DM: Monitor Bs ac/hs. Continue Lantus at bedtime with meal coverage. Off metformin at present  -borderline control 11. Leucocytosis: Resolving. Monitor for fevers and other sign so infection.  12. PVD: Continue ASA/Plavix and Lipitor.    Post Admission Physician Evaluation: 1. Functional deficits secondary  to PAD/Right BKA. 2. Patient is admitted to receive collaborative, interdisciplinary care between the physiatrist, rehab nursing staff, and therapy team. 3. Patient's level of medical complexity and substantial therapy needs in context of that medical necessity cannot be provided at a lesser intensity of care such as a SNF. 4. Patient has experienced substantial functional loss from his/her baseline which was documented above under the "Functional History" and "Functional Status" headings.  Judging by the patient's diagnosis, physical exam, and functional history, the patient has potential for functional progress which will result in measurable gains while on inpatient rehab.  These gains will be of substantial and practical use upon discharge  in facilitating mobility and self-care at the household level. 5. Physiatrist will provide 24 hour management of medical needs as well as oversight of the therapy plan/treatment and provide guidance as appropriate regarding the interaction of the two. 6. The Preadmission Screening has been reviewed and patient status is unchanged unless otherwise stated above. 7. 24 hour rehab nursing will assist with bladder management, bowel management, safety, skin/wound care, disease management, medication administration, pain management and patient education  and help integrate therapy concepts, techniques,education, etc. 8. PT will assess and treat for/with: Lower extremity strength, range of motion, stamina, balance, functional mobility, safety, adaptive techniques and equipment,  NMR, pain mgt, pre-prosthetic education.   Goals are: mod I. 9. OT will assess and treat for/with: ADL's, functional mobility, safety, upper extremity strength, adaptive techniques and equipment, NMR, pain mgt, pre-prosthetic education.   Goals are: mod I. Therapy may proceed with showering this patient. 10. SLP will assess and treat for/with: n/a.  Goals are: n/a. 11. Case Management and Social Worker will assess and treat for psychological issues and discharge planning. 12. Team conference will be held weekly to assess progress toward goals and to determine barriers to discharge. 13. Patient will receive at least  3 hours of therapy per day at least 5 days per week. 14. ELOS: 5-7 days       15. Prognosis:  excellent   I have personally performed a face to face diagnostic evaluation of this patient and formulated the key components of the plan.  Additionally, I have personally reviewed laboratory data, imaging studies, as well as relevant notes and concur with the physician assistant's documentation above.  Meredith Staggers, MD, Mellody Drown    Bary Leriche, PA-C 07/11/2018

## 2018-07-11 NOTE — H&P (Signed)
Physical Medicine and Rehabilitation Admission H&P        Chief Complaint  Patient presents with  . R-BKA with functional decline.       HPI:  [name] is a 58 year old female with history of T2DM with neuropathy, DVT, HTN, COPD, tobacco abuse, PVD with with multiple revascularization who was admitted via MD office on 07/05/18 with osteromyelitis right great toe. She was started on IV antibiotics and ABI showed evidence of severe PAD.  Dr. Scot Dock consulted and recommended amputation as patient as veins flet to be unconstructable.  She was evaluated by Dr. Sharol Given and underwent R-BKA on 01/31.  Post op pain control improving. Follow up labs showed persistent leucocytosis          Review of Systems  Constitutional: Negative for chills and fever.  HENT: Negative for hearing loss and tinnitus.   Eyes: Negative for blurred vision and double vision.  Respiratory: Negative for cough and shortness of breath.   Cardiovascular: Negative for chest pain and palpitations.  Gastrointestinal: Positive for constipation. Negative for heartburn and nausea.  Genitourinary: Negative for dysuria and frequency.  Musculoskeletal: Negative for joint pain and myalgias.  Skin: Positive for itching. Negative for rash.  Neurological: Positive for dizziness and sensory change (RLE).  Psychiatric/Behavioral: Negative for memory loss. The patient does not have insomnia.           Past Medical History:  Diagnosis Date  . COPD (chronic obstructive pulmonary disease) ([place])    . Depression    . DM (diabetes mellitus) ([place])    . DVT (deep venous thrombosis) ([facility])      x5  . Family history of colon cancer    . GERD (gastroesophageal reflux disease)    . Hyperlipemia    . Hypertension    . Hypothyroid    . Obesity    . PVD (peripheral vascular disease) ([place])    . Uterine cancer ([place])    . UTI (urinary tract infection)           Past Surgical History:  Procedure Laterality Date  .  ABDOMINAL HYSTERECTOMY      . CESAREAN SECTION      . FEMORAL BYPASS        x 5  . LUMBAR DISC SURGERY        L4-L5  . TOE AMPUTATION        right  . TONSILLECTOMY               Family History  Problem Relation Age of Onset  . Colon cancer Brother 40  . Hypertension Mother    . Hypothyroidism Mother    . Heart disease Father    . Diabetes Father    . Hyperlipidemia Father    . Clotting disorder Brother    . Diabetes Brother    . Heart disease Brother        Social History:  Married. Independent without AD PTA. She reports that she has been smoking cigarettes--1 PPD. She has a 19.00 pack-year smoking history. She has never used smokeless tobacco. She reports that she does not drink alcohol or use drugs.           Allergies  Allergen Reactions  . Aleve [Naproxen Sodium] Hives and Itching  . Cortisone Swelling      Internal organs  . Prednisone Swelling      Makes internal organs swell  . Vancomycin Itching  Medications Prior to Admission  Medication Sig Dispense Refill  . albuterol (PROVENTIL HFA;VENTOLIN HFA) 108 (90 Base) MCG/ACT inhaler Inhale 2 puffs into the lungs every 6 (six) hours as needed for wheezing or shortness of breath. 1 Inhaler 11  . aspirin 325 MG tablet Take 325 mg by mouth daily.      Marland Kitchen atorvastatin (LIPITOR) 40 MG tablet TAKE 1 TABLET DAILY (Patient taking differently: Take 40 mg by mouth daily. ) 90 tablet 0  . Cholecalciferol (VITAMIN D3) 5000 units TABS Take 1 tablet by mouth daily.      . clopidogrel (PLAVIX) 75 MG tablet TAKE 1 TABLET DAILY (Patient taking differently: Take 75 mg by mouth once. ) 90 tablet 0  . escitalopram (LEXAPRO) 20 MG tablet TAKE 1 & 1/2 TABLETS ONCE DAILY (Patient taking differently: 30 mg daily. ) 135 tablet 0  . furosemide (LASIX) 20 MG tablet TAKE 1 TABLET DAILY (Patient taking differently: Take 20 mg by mouth daily. ) 90 tablet 0  . levothyroxine (SYNTHROID, LEVOTHROID) 100 MCG tablet TAKE (1) TABLET DAILY BE-  FORE BREAKFAST. (Patient taking differently: Take 100 mcg by mouth daily. ) 90 tablet 0  . losartan (COZAAR) 50 MG tablet TAKE 1 TABLET DAILY (Patient taking differently: Take 50 mg by mouth daily. ) 90 tablet 0  . metFORMIN (GLUCOPHAGE-XR) 500 MG 24 hr tablet TAKE 2 TABLET ONCE DAILY WITH BREAKFAST (Patient taking differently: Take 1,000 mg by mouth daily with breakfast. ) 180 tablet 3  . mupirocin ointment (BACTROBAN) 2 % APPLY TO THE NOSE AS DIRECTED 2 TIMES A DAY (Patient taking differently: Apply 1 application topically 2 (two) times daily. To nose as directed.) 22 g 1  . omeprazole (PRILOSEC) 20 MG capsule TAKE (1) CAPSULE DAILY (Patient taking differently: Take 20 mg by mouth daily. ) 90 capsule 0  . Potassium Gluconate 595 MG CAPS Take 1 capsule by mouth daily.      Marland Kitchen tretinoin (RETIN-A) 0.05 % cream APPLY TO AFFECTED AREAS AT BEDTIME (Patient taking differently: Apply 1 application topically at bedtime. To affected areas.) 20 g 0  . umeclidinium bromide (INCRUSE ELLIPTA) 62.5 MCG/INH AEPB Inhale 1 puff into the lungs daily. 30 each 3  . [DISCONTINUED] warfarin (COUMADIN) 5 MG tablet TAKE AS DIRECTED UP TO 1 & 1/2 TABLETS A DAY (Patient taking differently: Take 5-7.5 mg by mouth See admin instructions. Take as directed up to 7.5mg  daily) 135 tablet 0  . hydrOXYzine (ATARAX/VISTARIL) 10 MG tablet Take 1 tablet (10 mg total) by mouth 3 (three) times daily as needed. 30 tablet 0  . ONE TOUCH ULTRA TEST test strip TEST BLOOD SUGARS TWICE A DAY 50 each 2      Drug Regimen Review  Drug regimen was reviewed and remains appropriate with no significant issues identified   Home: Home Living Family/patient expects to be discharged to:: Private residence Living Arrangements: Spouse/significant other Available Help at Discharge: Family, Available PRN/intermittently, Available 24 hours/day, Friend(s) Type of Home: House Home Access: Stairs to enter, Ramped entrance Home Layout: One level Bathroom  Shower/Tub: Multimedia programmer: Standard Bathroom Accessibility: Yes Home Equipment: Civil engineer, contracting, Grab bars - toilet, Grab bars - tub/shower, Environmental consultant - 2 wheels, Cane - single point, [employer] - 4 wheels   Functional History: Prior Function Level of Independence: Independent Comments: has equipment for previous family member wiht fracture   Functional Status:  Mobility: Bed Mobility Overal bed mobility: Modified Independent General bed mobility comments: pt sitting in recliner upon arrival  Transfers Overall transfer level: Needs assistance Equipment used: Rolling walker (2 wheeled) Transfers: Sit to/from Stand, [employer] Transfers Sit to Stand: Min guard Stand pivot transfers: Min guard General transfer comment: pt required vc to descend with control Ambulation/Gait Ambulation/Gait assistance: Min assist, +2 safety/equipment(for close chair follow. ) Gait Distance (Feet): 8 Feet(x2.  Fatigues quickly due to UE arm weakness.  ) Assistive device: Rolling walker (2 wheeled) Gait Pattern/deviations: Step-to pattern, Trunk flexed, Antalgic(hop to pattern) General Gait Details: Pt required adjustment to RW height to improve use of B UEs.  She presents with jarring hopping pattern.  Cues to stay close to RW for adequate support.  close chair follow.   Gait velocity: reduced Gait velocity interpretation: <1.8 ft/sec, indicate of risk for recurrent falls   ADL: ADL Overall ADL's : Needs assistance/impaired Eating/Feeding: Independent Grooming: Set up, Sitting Upper Body Bathing: Set up, Sitting Lower Body Bathing: Set up, Sitting/lateral leans Upper Body Dressing : Supervision/safety, Sitting Lower Body Dressing: Min guard, Sit to/from stand Lower Body Dressing Details (indicate cue type and reason): pt with reports of slight dizziness secondary to pain medication;minguard for stability while standing;mod I with return demonstration application of RLE brace Toilet Transfer:  Min guard, Buyer, retail Details (indicate cue type and reason): min guard for stand<>pivot transfer from EOB to [facility]; Toileting- Water quality scientist and Hygiene: Min guard, Sit to/from stand Functional mobility during ADLs: Min guard, Minimal assistance, Rolling walker General ADL Comments: min guard for initial stability with use of RW;minA for functional mobility reaching outside base of support with single UE support on RW and minA for steps backwards to recliner   Cognition: Cognition Overall Cognitive Status: Within Functional Limits for tasks assessed Orientation Level: Oriented X4 Cognition Arousal/Alertness: Awake/alert Behavior During Therapy: WFL for tasks assessed/performed Overall Cognitive Status: Within Functional Limits for tasks assessed General Comments: able to return demonstrate application of RLE brace     Blood pressure 133/68, pulse 74, temperature 98.5 F (36.9 C), temperature source Oral, resp. rate 18, height 5\' 10"  (1.778 m), weight 104.8 kg, SpO2 95 %. Physical Exam  Constitutional: No distress.  obese  HENT:  Head: Normocephalic and atraumatic.  Eyes: Pupils are equal, round, and reactive to light. EOM are normal.  Neck: Normal range of motion. No tracheal deviation present. No thyromegaly present.  Cardiovascular: Normal rate and regular rhythm.  Respiratory: Effort normal. No respiratory distress. She has no wheezes. She has no rales.  GI: Soft. She exhibits no distension. There is no abdominal tenderness.  Musculoskeletal:     Comments: Compressive dressing with wound VAC on L-BKA  Neurological:  UE 5/5. RLE limited by pain/vac, can lift leg off bed, can flex/extend knee against gravity. LLE 4-5/5. No sensory changes  Skin: Skin is warm and dry.  Multiple scars due to healed lesion under chin, BUE/BLE and upper chest. LLE with multiple scabbed lesions.   Psychiatric: She has a normal mood and affect. Her behavior is normal.      Lab  Results Last 48 Hours       Results for orders placed or performed during the hospital encounter of 07/04/18 (from the past 48 hour(s))  Glucose, capillary     Status: Abnormal    Collection Time: 07/09/18  4:44 PM  Result Value Ref Range    Glucose-Capillary 134 (H) 70 - 99 mg/dL  Glucose, capillary     Status: Abnormal    Collection Time: 07/09/18  9:04 PM  Result Value Ref Range  Glucose-Capillary 179 (H) 70 - 99 mg/dL  Glucose, capillary     Status: Abnormal    Collection Time: 07/09/18 10:06 PM  Result Value Ref Range    Glucose-Capillary 167 (H) 70 - 99 mg/dL  Glucose, capillary     Status: Abnormal    Collection Time: 07/10/18  8:35 AM  Result Value Ref Range    Glucose-Capillary 163 (H) 70 - 99 mg/dL  Glucose, capillary     Status: Abnormal    Collection Time: 07/10/18  1:30 PM  Result Value Ref Range    Glucose-Capillary 154 (H) 70 - 99 mg/dL  Glucose, capillary     Status: Abnormal    Collection Time: 07/10/18  5:51 PM  Result Value Ref Range    Glucose-Capillary 174 (H) 70 - 99 mg/dL  Glucose, capillary     Status: Abnormal    Collection Time: 07/10/18  9:17 PM  Result Value Ref Range    Glucose-Capillary 170 (H) 70 - 99 mg/dL  Glucose, capillary     Status: Abnormal    Collection Time: 07/11/18  7:58 AM  Result Value Ref Range    Glucose-Capillary 162 (H) 70 - 99 mg/dL  Glucose, capillary     Status: Abnormal    Collection Time: 07/11/18 11:33 AM  Result Value Ref Range    Glucose-Capillary 164 (H) 70 - 99 mg/dL      Imaging Results (Last 48 hours)  No results found.           Medical Problem List and Plan: 1.  Functional and mobility deficits secondary to PAD, right BKA             -admit to inpatient rehab 2.  H/o BLE/DVT/Anticoagulation: Pharmaceutical: Coumadin 3. Pain Management: Oxycodone prn is effective.  4. Mood: LCSW to follow for evaluation and support.  5. Neuropsych: This patient is capable of making decisions on her own behalf. 6.  Skin/Wound Care: Continue compressive dressing with prevena VAC on R-BKA.              -remove dressing post-op day 7 7. Fluids/Electrolytes/Nutrition: Monitor I/O --check lytes in am.  8. HTN: Monitor BP bid. Continue lasix and cozaar.  9. COPD: Encourage tobacco cessation. Continue Incruse daily.  10. T2DM: Monitor Bs ac/hs. Continue Lantus at bedtime with meal coverage. Off metformin at present             -borderline control 11. Leucocytosis: Resolving. Monitor for fevers and other sign so infection.  12. PVD: Continue ASA/Plavix and Lipitor.      Post Admission Physician Evaluation: 1. Functional deficits secondary  to PAD/Right BKA. 2. Patient is admitted to receive collaborative, interdisciplinary care between the physiatrist, rehab nursing staff, and therapy team. 3. Patient's level of medical complexity and substantial therapy needs in context of that medical necessity cannot be provided at a lesser intensity of care such as a SNF. 4. Patient has experienced substantial functional loss from his/her baseline which was documented above under the "Functional History" and "Functional Status" headings.  Judging by the patient's diagnosis, physical exam, and functional history, the patient has potential for functional progress which will result in measurable gains while on inpatient rehab.  These gains will be of substantial and practical use upon discharge  in facilitating mobility and self-care at the household level. 5. Physiatrist will provide 24 hour management of medical needs as well as oversight of the therapy plan/treatment and provide guidance as appropriate regarding the interaction of the two.  6. The Preadmission Screening has been reviewed and patient status is unchanged unless otherwise stated above. 7. 24 hour rehab nursing will assist with bladder management, bowel management, safety, skin/wound care, disease management, medication administration, pain management and patient  education  and help integrate therapy concepts, techniques,education, etc. 8. PT will assess and treat for/with: Lower extremity strength, range of motion, stamina, balance, functional mobility, safety, adaptive techniques and equipment, NMR, pain mgt, pre-prosthetic education.   Goals are: mod I. 9. OT will assess and treat for/with: ADL's, functional mobility, safety, upper extremity strength, adaptive techniques and equipment, NMR, pain mgt, pre-prosthetic education.   Goals are: mod I. Therapy may proceed with showering this patient. 10. SLP will assess and treat for/with: n/a.  Goals are: n/a. 11. Case Management and Social Worker will assess and treat for psychological issues and discharge planning. 12. Team conference will be held weekly to assess progress toward goals and to determine barriers to discharge. 13. Patient will receive at least 3 hours of therapy per day at least 5 days per week. 14. ELOS: 5-7 days       15. Prognosis:  excellent     I have personally performed a face to face diagnostic evaluation of this patient and formulated the key components of the plan.  Additionally, I have personally reviewed laboratory data, imaging studies, as well as relevant notes and concur with the physician assistant's documentation above.   Meredith Staggers, MD, Mellody Drown     Bary Leriche, PA-C 07/11/2018  The patient's status has not changed. The original post admission physician evaluation remains appropriate, and any changes from the pre-admission screening or documentation from the acute chart are noted above.  Meredith Staggers, MD 07/11/2018

## 2018-07-11 NOTE — Progress Notes (Signed)
Inpatient Rehabilitation-Admissions Coordinator   [facility] has received medical clearance for admit to CIR today. Pt has agreed to CIR. RN and SW notified of plan.   Please call if questions.   Jhonnie Garner, OTR/L  Rehab Admissions Coordinator  [phone] 07/11/2018 4:23 PM

## 2018-07-11 NOTE — Progress Notes (Signed)
Occupational Therapy Treatment Patient Details Name: [name] MRN: [id number] DOB: 12-16-1960 Today's Date: 07/11/2018    History of present illness 58 year old female with history of COPD, diabetes mellitus type 2, DVT, GERD, hyperlipidemia, hypertension, hypothyroidism, obesity, PAD with prior toe amputations, depression presented on 07/04/2018 due to chronic nonhealing wound of right great toe with purulent drainage, seen by PCP and admitted for right great toe osteomyelitis. S/P Right below knee amputation 1/31.    OT comments  Pt progressing toward established OT goals. Pt currently requires minguard -minA with functional mobility during ADL. Pt able to return demonstrate don/doff R limb guard. Pt continues to demonstrate great motivation to participate during session. Due to limitations listed below (see OT problem list) pt will continue to benefit from skilled OT services to maximize independence and safety with ADL and functional mobility to allow d/c to CIR prior to return home. Will continue to follow acutely.    Follow Up Recommendations  CIR    Equipment Recommendations    3in1   Recommendations for Other Services      Precautions / Restrictions Precautions Precautions: Fall Restrictions Weight Bearing Restrictions: No Other Position/Activity Restrictions: per nsg, R limb guard to be worn when up in standing or when ambulating per pt, no order in chart.         Mobility Bed Mobility Overal bed mobility: Modified Independent             General bed mobility comments: pt sitting in recliner upon arrival  Transfers Overall transfer level: Needs assistance Equipment used: Rolling walker (2 wheeled) Transfers: Sit to/from [facility] Sit to Stand: Min guard Stand pivot transfers: Min guard       General transfer comment: pt required vc to descend with control    Balance Overall balance assessment: Needs assistance Sitting-balance  support: Feet supported;No upper extremity supported Sitting balance-Leahy Scale: Good     Standing balance support: Single extremity supported;During functional activity Standing balance-Leahy Scale: Poor Standing balance comment: reliant on single UE for support and minA when reaching outside base of support                           ADL either performed or assessed with clinical judgement   ADL Overall ADL's : Needs assistance/impaired                     Lower Body Dressing: Min guard;Sit to/from stand Lower Body Dressing Details (indicate cue type and reason): pt with reports of slight dizziness secondary to pain medication;minguard for stability while standing;mod I with return demonstration application of RLE brace Toilet Transfer: Min guard;Stand-pivot           Functional mobility during ADLs: Min guard;Minimal assistance;Rolling walker General ADL Comments: min guard for initial stability with use of RW;minA for functional mobility reaching outside base of support with single UE support on RW and minA for steps backwards to recliner     Vision       Perception     Praxis      Cognition Arousal/Alertness: Awake/alert Behavior During Therapy: WFL for tasks assessed/performed Overall Cognitive Status: Within Functional Limits for tasks assessed                                 General Comments: able to return demonstrate application of RLE brace  Exercises Other Exercises Other Exercises: Pt fatigued from back to back session as OT completed tx as PT was entering room.  Educated on hip extension in sidelying and standing.  Educated on knee flexion and extension when brace is removed.  Pt educated to keep limb guard intact when resting.     Shoulder Instructions       General Comments educated pt on importance of knee extension    Pertinent Vitals/ Pain       Pain Assessment: 0-10 Pain Score: 3  Faces Pain Scale:  Hurts little more Pain Location: R lower leg and B shoudlers Pain Descriptors / Indicators: Operative site guarding Pain Intervention(s): Monitored during session  Home Living                                          Prior Functioning/Environment              Frequency  Min 2X/week        Progress Toward Goals  OT Goals(current goals can now be found in the care plan section)  Progress towards OT goals: Progressing toward goals  Acute Rehab OT Goals Patient Stated Goal: to go to CIR OT Goal Formulation: With patient Time For Goal Achievement: 07/23/18 Potential to Achieve Goals: Good ADL Goals Pt Will Perform Grooming: with modified independence;standing Pt Will Perform Lower Body Dressing: with modified independence;sit to/from stand Pt Will Transfer to Toilet: with modified independence;ambulating Additional ADL Goal #1: Pt/family will demonstrate understanding of residual limb management, edema control, and frequent skin inspection/skin care independently.  Plan Discharge plan remains appropriate    Co-evaluation                 AM-PAC OT "6 Clicks" Daily Activity     Outcome Measure   Help from another person eating meals?: None Help from another person taking care of personal grooming?: A Little Help from another person toileting, which includes using toliet, bedpan, or urinal?: A Little Help from another person bathing (including washing, rinsing, drying)?: A Little Help from another person to put on and taking off regular upper body clothing?: A Little Help from another person to put on and taking off regular lower body clothing?: A Little 6 Click Score: 19    End of Session Equipment Utilized During Treatment: Gait belt;Rolling walker  OT Visit Diagnosis: Unsteadiness on feet (R26.81);Other abnormalities of gait and mobility (R26.89);Pain Pain - Right/Left: Right Pain - part of body: (surgical site BKA)   Activity Tolerance  Patient tolerated treatment well   Patient Left in chair;with call bell/phone within reach;Other (comment)(with PT present)   Nurse Communication Mobility status        Time: 1040-1109 OT Time Calculation (min): 29 min  Charges: OT General Charges $OT Visit: 1 Visit OT Treatments $Self Care/Home Management : 23-37 mins  Dorinda Hill OTR/L Acute Rehabilitation Services Office: [place] 07/11/2018, 1:05 PM

## 2018-07-11 NOTE — PMR Pre-admission (Signed)
[facility] PMR Admission Coordinator Pre-Admission Assessment  Patient: [name] is an 58 y.o., female MRN: [id number] DOB: 06-01-1961 Height: 5' 10"  (177.8 cm) Weight: 104.8 kg  Insurance Information HMO:     PPO:      PCP:      IPA:      80/20: Yes     OTHER:  PRIMARY: Medicare A and B      Policy#: [id number]     Subscriber: Patient CM Name:       Phone#:      Fax#:  Pre-Cert#:       Employer:  Benefits:  Phone #: NA     Name: Verified eligibility via [employer] on 07/11/18 Eff. Date: Part A: 04/08/16; Part B: 04/08/16     Deduct: $1,408      Out of Pocket Max: NA      Life Max: NA CIR: Covered per Medicare Guidelines once yearly deductible is met      SNF: days 1-20, 100%; days 21-100, 80% Outpatient: 80%     Co-Pay: 20% Home Health: 100%      Co-Pay:  DME: 80%     Co-Pay: 20% Providers: Pt's choice  SECONDARY:       Policy#:       Subscriber:  CM Name:       Phone#:      Fax#:  Pre-Cert#:       Employer:  Benefits:  Phone #:      Name:  Eff. Date:      Deduct:       Out of Pocket Max:       Life Max:  CIR:       SNF:  Outpatient:     Co-Pay:  Home Health:       Co-Pay:  DME:      Co-Pay:  Medicaid Application Date:       Case Manager:  Disability Application Date:       Case Worker:   Emergency Contact Information Contact Information    Name Relation Home Work Mobile   [place] Spouse [phone]  [phone]      Current Medical History  Patient Admitting Diagnosis: Transtibial amputation 07/08/18 History of Present Illness: Pt is a 58 yo Female with history of COPD, DM2, DVT, GERD, Hyperlipidemia, HTN, Hypothyroidism, obesity, PAD with prior toe amputations and depression. Pt was admitted on 07/04/18 due to a chronic nonhealing wound on right foot, showing right great toe osteomyelitis. Pt underwent a right transtibial amputation on 07/08/18 by Dr. Sharol Given. Pt has wound vac. Pt was evaluated by therapies with recommendations for CIR. Pt is to be  admitted to CIR on 07/11/18.    Patient's medical record from [facility] has been reviewed by the rehabilitation admission coordinator and physician.  Past Medical History  Past Medical History:  Diagnosis Date  . COPD (chronic obstructive pulmonary disease) ([place])   . Depression   . DM (diabetes mellitus) ([place])   . DVT (deep venous thrombosis) ([facility])    x5  . Family history of colon cancer   . GERD (gastroesophageal reflux disease)   . Hyperlipemia   . Hypertension   . Hypothyroid   . Obesity   . PVD (peripheral vascular disease) ([place])   . Uterine cancer ([place])   . UTI (urinary tract infection)     Family History   family history includes Clotting disorder in her brother; Colon cancer (age of onset: 73) in her brother; Diabetes in her  brother and father; Heart disease in her brother and father; Hyperlipidemia in her father; Hypertension in her mother; Hypothyroidism in her mother.  Prior Rehab/Hospitalizations Has the patient had major surgery during 100 days prior to admission? No    Current Medications  Current Facility-Administered Medications:  .  acetaminophen (TYLENOL) tablet 325-650 mg, 325-650 mg, Oral, Q6H PRN, Rayburn, Shawn Montgomery, PA-C .  albuterol (PROVENTIL) (2.5 MG/3ML) 0.083% nebulizer solution 3 mL, 3 mL, Inhalation, Q6H PRN, Lady Deutscher, MD .  aspirin tablet 325 mg, 325 mg, Oral, Daily, Alekh, Kshitiz, MD, 325 mg at 07/11/18 1211 .  atorvastatin (LIPITOR) tablet 40 mg, 40 mg, Oral, Daily, Lady Deutscher, MD, 40 mg at 07/11/18 0909 .  bisacodyl (DULCOLAX) EC tablet 5 mg, 5 mg, Oral, Once, Blount, Scarlette Shorts T, NP .  cholecalciferol (VITAMIN D3) tablet 5,000 Units, 5,000 Units, Oral, Daily, Lady Deutscher, MD, 5,000 Units at 07/11/18 [phone]  clopidogrel (PLAVIX) tablet 75 mg, 75 mg, Oral, Daily, Alekh, Kshitiz, MD, 75 mg at 07/11/18 1211 .  diphenhydrAMINE (BENADRYL) capsule 25 mg, 25 mg, Oral, Q6H PRN, Opyd, Ilene Qua, MD, 25 mg at 07/09/18 2155 .   docusate sodium (COLACE) capsule 100 mg, 100 mg, Oral, BID, Rayburn, Shawn Montgomery, PA-C, 100 mg at 07/11/18 0909 .  enoxaparin (LOVENOX) injection 40 mg, 40 mg, Subcutaneous, Q24H, Rayburn, Shawn Montgomery, PA-C, 40 mg at 07/11/18 0909 .  furosemide (LASIX) tablet 20 mg, 20 mg, Oral, Daily, Lady Deutscher, MD, 20 mg at 07/11/18 0909 .  HYDROmorphone (DILAUDID) injection 0.5 mg, 0.5 mg, Intravenous, Q2H PRN, Rayburn, Neta Mends, PA-C, 0.5 mg at 07/10/18 0620 .  insulin aspart (novoLOG) injection 0-15 Units, 0-15 Units, Subcutaneous, TID WC, Hongalgi, Lenis Dickinson, MD, 3 Units at 07/11/18 1255 .  insulin aspart (novoLOG) injection 0-5 Units, 0-5 Units, Subcutaneous, QHS, Hongalgi, Lenis Dickinson, MD, 2 Units at 07/05/18 2224 .  insulin glargine (LANTUS) injection 20 Units, 20 Units, Subcutaneous, QHS, Alekh, Kshitiz, MD, 20 Units at 07/10/18 2144 .  lactated ringers infusion, , Intravenous, Continuous, Belinda Block, MD, Last Rate: 10 mL/hr at 07/09/18 1431 .  levothyroxine (SYNTHROID, LEVOTHROID) tablet 100 mcg, 100 mcg, Oral, Daily, Lady Deutscher, MD, 100 mcg at 07/11/18 9379 .  losartan (COZAAR) tablet 50 mg, 50 mg, Oral, Daily, Lady Deutscher, MD, 50 mg at 07/11/18 0909 .  metoCLOPramide (REGLAN) tablet 5-10 mg, 5-10 mg, Oral, Q8H PRN **OR** metoCLOPramide (REGLAN) injection 5-10 mg, 5-10 mg, Intravenous, Q8H PRN, Rayburn, Shawn Montgomery, PA-C .  nicotine (NICODERM CQ - dosed in mg/24 hours) patch 21 mg, 21 mg, Transdermal, Daily, Blount, Xenia T, NP, 21 mg at 07/11/18 0910 .  ondansetron (ZOFRAN) tablet 4 mg, 4 mg, Oral, Q6H PRN **OR** ondansetron (ZOFRAN) injection 4 mg, 4 mg, Intravenous, Q6H PRN, Rayburn, Neta Mends, PA-C .  oxyCODONE (Oxy IR/ROXICODONE) immediate release tablet 5-10 mg, 5-10 mg, Oral, Q4H PRN, Starla Link, Kshitiz, MD, 5 mg at 07/11/18 0926 .  pantoprazole (PROTONIX) EC tablet 40 mg, 40 mg, Oral, Daily, Lady Deutscher, MD, 40 mg at 07/11/18 0909 .   polyethylene glycol (MIRALAX / GLYCOLAX) packet 17 g, 17 g, Oral, Daily PRN, Starla Link, Kshitiz, MD .  senna (SENOKOT) tablet 8.6 mg, 1 tablet, Oral, Daily, Alekh, Kshitiz, MD, 8.6 mg at 07/11/18 0909 .  umeclidinium bromide (INCRUSE ELLIPTA) 62.5 MCG/INH 1 puff, 1 puff, Inhalation, Daily, Randa Spike C, MD .  warfarin (COUMADIN) tablet 5 mg, 5 mg, Oral, ONCE-1800, Aline August, MD .  Warfarin - Physician Dosing Inpatient, , Does not apply, q1800, Aline August, MD  Patients Current Diet:   Diet Order            Diet - low sodium heart healthy        Diet Carb Modified        Diet heart healthy/carb modified Room service appropriate? Yes; Fluid consistency: Thin  Diet effective now              Precautions / Restrictions Precautions Precautions: Fall Restrictions Weight Bearing Restrictions: No Other Position/Activity Restrictions: per nsg, R limb guard to be worn when up in standing or when ambulating per pt, no order in chart.     Has the patient had 2 or more falls or a fall with injury in the past year?No  Prior Activity Level Community (5-7x/wk): Active PTA; not working, was able to drive.   Prior Functional Level Do you want Prior Function Level of Independence: Independent Comments: has equipment for previous family member wiht fracture from other? Self Care: Did the patient need help bathing, dressing, using the toilet or eating?  Independent  Indoor Mobility: Did the patient need assistance with walking from room to room (with or without device)? Independent  Stairs: Did the patient need assistance with internal or external stairs (with or without device)? Independent  Functional Cognition: Did the patient need help planning regular tasks such as shopping or remembering to take medications? Independent  Home Equities trader / Equipment Home Assistive Devices/Equipment: None Home Equipment: Shower seat, Grab bars - toilet, Grab bars - tub/shower, Environmental consultant -  2 wheels, Cane - single point, Walker - 4 wheels  Prior Device Use: Indicate devices/aids used by the patient prior to current illness, exacerbation or injury? None of the above  Prior Functional Level Comments: has equipment for previous family member wiht fracture   Prior Functional Level Current Functional Level  Bed Mobility  Independent Independent  Transfers Independent Min G  Mobility - Walk/Wheelchair Independent Min A x2  Mobility - Ambulation/Gait Independent Min A x2  Upper Body Dressing Independent Set up  Lower Body Dressing Independent  Min G  Grooming Independent Set up  Eating/Drinking Independent Mod I (clinical judgement)  Toilet Transfer Independent Min G  Bladder Continence Continent, Independent Continent, min G  Bowel Management  Continent, Independent Continent  Stair Climbing Independent NT  Communication WNL No difficulties  Memory WNL Not formally assessed. Overall cognitive status: [facility] for tasks assessed.   Cooking/Meal Prep  Estate manager/land agent  Independent     Special needs/care consideration BiPAP/CPAP: no CPM: no Continuous Drip IV: lactated ringers infusion Dialysis: no        Days: no Life Vest: no Oxygen: no Special Bed: no Trach Size: no Wound Vac (area): Yes      Location: RLE Skin: surgical incision to RLE; rash to right, left, posterior                   Bowel mgmt: continent, last BM: 07/05/28 Bladder mgmt: continent Diabetic mgmt: yes  Previous Home Environment Living Arrangements: Spouse/significant other Available Help at Discharge: Family, Available PRN/intermittently, Available 24 hours/day, Friend(s) Type of Home: House Home Layout: One level Home Access: Stairs to enter, Ramped entrance [employer] Shower/Tub: Multimedia programmer: Standard Bathroom Accessibility: Yes How Accessible: Accessible via walker [place]: No  Discharge  Living Setting Plans for  Discharge Living Setting: Patient's home, Lives with (comment)(husband) Type of Home at Discharge: House Discharge Home Layout: One level Discharge Home Access: [place] entrance Discharge Bathroom Shower/Tub: Walk-in shower Discharge Bathroom Toilet: Standard Discharge Bathroom Accessibility: Yes How Accessible: Accessible via walker Does the patient have any problems obtaining your medications?: No  Social/Family/Support Systems Patient Roles: Spouse Contact Information: husband is emergency contact: [phone];  Anticipated Caregiver: husband at nighttime; pt has many friends who assist as needed; per pt, her husband works 12 hour days so she will need to be Mod I for most tasks but can call on friends for assist if needed.  Anticipated Caregiver's Contact Information: see above Ability/Limitations of Caregiver: Min A of husband Caregiver Availability: Evenings only Discharge Plan Discussed with Primary Caregiver: No (pt is able to speak on her own behalf; Mod I goals) Was unable to get in touch with pt's husband, left voicemail Is Caregiver In Agreement with Plan?: Yes, per pt.  Does Caregiver/Family have Issues with Lodging/Transportation while Pt is in Rehab?: No  Goals/Additional Needs Patient/Family Goal for Rehab: PT/OT: Mod I Expected length of stay: 5-7 days Cultural Considerations: NA Dietary Needs: Heart Healthy/carb modified; thin liquids Equipment Needs: TBD Pt/Family Agrees to Admission and willing to participate: Yes Program Orientation Provided & Reviewed with Pt/Caregiver Including Roles  & Responsibilities: Yes(pt)  Barriers to Discharge: Weight bearing restrictions  Patient Condition: I have reviewed medical records from [facility], spoken with CSW, RN, and MD and the patient. I met with patient at the bedside for inpatient rehabilitation assessment.  Patient will benefit from ongoing PT and OT, can actively  participate in 3 hours of therapy a day 5 days of the week, and can make measurable gains during the admission.  Patient will also benefit from the coordinated team approach during an Inpatient Acute Rehabilitation admission.  The patient will receive intensive therapy as well as Rehabilitation physician, nursing, social worker, and care management interventions.  Due to safety, skin/wound care, disease management, medical administration, pain management, patient education the patient requires 24 hour a day rehabilitation nursing.  The patient is currently Min G transfers and Min Ax2 with mobility and Min G /set up for basic ADLs.  Discharge setting and therapy post discharge at home with home health is anticipated.  Patient has agreed to participate in the Acute Inpatient Rehabilitation Program and will admit today, 07/11/18.  Preadmission Screen Completed By:  Jhonnie Garner, 07/11/2018 2:11 PM ______________________________________________________________________   Discussed status with Dr. Naaman Plummer on 07/11/18 at 2:10PM and received telephone approval for admission today.  Admission Coordinator:  Jhonnie Garner, time 2:10PM/Date 07/11/18   Assessment/Plan: Diagnosis: right BKA 1. Does the need for close, 24 hr/day  Medical supervision in concert with the patient's rehab needs make it unreasonable for this patient to be served in a less intensive setting? Yes 2. Co-Morbidities requiring supervision/potential complications: wound care, pain 3. Due to bladder management, bowel management, safety, skin/wound care, disease management, medication administration, pain management and patient education, does the patient require 24 hr/day rehab nursing? Yes 4. Does the patient require coordinated care of a physician, rehab nurse, PT (1-2 hrs/day, 5 days/week) and OT (1-2 hrs/day, 5 days/week) to address physical and functional deficits in the context of the above medical diagnosis(es)? Yes Addressing deficits in the  following areas: balance, endurance, locomotion, strength, transferring, bowel/bladder control, bathing, dressing, feeding, grooming, toileting and psychosocial support 5. Can the patient actively participate in an intensive therapy program of at  least 3 hrs of therapy 5 days a week? Yes 6. The potential for patient to make measurable gains while on inpatient rehab is excellent 7. Anticipated functional outcomes upon discharge from inpatients are: modified independent PT, modified independent OT, modified independent SLP 8. Estimated rehab length of stay to reach the above functional goals is: 5-7 days 9. Anticipated D/C setting: Home 10. Anticipated post D/C treatments: [place] therapy 11. Overall Rehab/Functional Prognosis: excellent    RECOMMENDATIONS: This patient's condition is appropriate for continued rehabilitative care in the following setting: CIR Patient has agreed to participate in recommended program. Yes Note that insurance prior authorization may be required for reimbursement for recommended care.  Comment:  Meredith Staggers, MD, [facility]  07/11/2018  Jhonnie Garner 07/11/2018

## 2018-07-11 NOTE — Discharge Summary (Signed)
Physician Discharge Summary  Bryleigh Ottaway Tuggle-Ziglar FBP:[id number] DOB: Mar 12, 1961 DOA: 07/04/2018  PCP: Chipper Herb, MD  Admit date: 07/04/2018 Discharge date: 07/11/2018  Admitted From: Home Disposition:  CIR  Recommendations for Outpatient Follow-up:  1. Follow up with CIR provider at earliest convenience 2. follow-up with Dr. Sharol Given as an outpatient 3. Wound care as per Dr. Jess Barters recommendations 4. Activity as per Dr. Georgia Lopes recommendations   Home Health: No  equipment/Devices: Wound VAC  Discharge Condition: Stable CODE STATUS: Full Diet recommendation: Heart Healthy / Carb Modified   Brief/Interim Summary: 58 year old female with history of COPD, diabetes mellitus type 2, DVT, GERD, hyperlipidemia, hypertension, hypothyroidism, obesity, PAD with prior toe amputations, depression presented on 07/04/2018 due to chronic nonhealing wound of right great toe with purulent drainage, seen by PCP and admitted for right great toe osteomyelitis.  Orthopedics and vascular surgery were consulted.  She had transtibial amputation on 07/08/2018.  Antibiotics were subsequently discontinued.  She is currently hemodynamically stable.  PT/OT is recommending CIR.  Discharge to CIR once bed is available.  Discharge Diagnoses:  Principal Problem:   Osteomyelitis of great toe of right foot ([place]) Active Problems:   Diabetic infection of right foot (HCC)   Hyperlipidemia   SMOKER   Essential hypertension   GERD (gastroesophageal reflux disease)   Chronic obstructive pulmonary disease (HCC)   Obesity (BMI 30-39.9)   Hypothyroidism   Peripheral vascular insufficiency (HCC)   Deep vein thrombosis (DVT) of both lower extremities (HCC)   Intermittent claudication (HCC)   Mild protein-calorie malnutrition (HCC)   Acquired absence of right leg below knee (HCC)  Right great toe osteomyelitis/diabetic foot infection/severe PAD -Vascular surgery and orthopedics following.  No options for  revascularization currently.   -Status post transtibial amputation on 07/08/2018 by Dr. Sharol Given with a wound VAC.  Antibiotics have been discontinued.  Currently hemodynamically stable -PT/OT recommending CIR.  Wound care as per orthopedics recommendations. -Discharge to CIR once bed is available. -Outpatient follow-up with Dr. Sharol Given  Diabetes mellitus type 2 with peripheral neuropathy with hyperglycemia -Metformin on hold.  continue Lantus, continue insulin sliding scale coverage.  A1c 8.8.  Essential hypertension -Continue losartan.  Monitor blood pressure  Hyperlipidemia -Continue atorvastatin  Hypothyroidism -Continue Synthroid  GERD  -continue PPI  COPD -Stable.  No signs of bronchospasm  Tobacco abuse -Counseled about cessation.  Obesity  -Outpatient follow-up  History of DVTs -Apparently patient has been on Coumadin for the last 13 years and does not check her INR routinely; she states that she has been on Coumadin 5 mg daily we will do a changes.  She was also on aspirin and Plavix.    She would like all of these to be resumed.  Dr. Sharol Given has cleared the patient to resume these meds.  Will resume Coumadin 5 mg daily along with aspirin and Plavix.  Outpatient follow-up with PCP regarding management of the same.  Discharge Instructions  Discharge Instructions    Call MD for:  difficulty breathing, headache or visual disturbances   Complete by:  As directed    Call MD for:  extreme fatigue   Complete by:  As directed    Call MD for:  hives   Complete by:  As directed    Call MD for:  persistant dizziness or light-headedness   Complete by:  As directed    Call MD for:  persistant nausea and vomiting   Complete by:  As directed    Call MD for:  redness,  tenderness, or signs of infection (pain, swelling, redness, odor or green/yellow discharge around incision site)   Complete by:  As directed    Call MD for:  severe uncontrolled pain   Complete by:  As directed     Call MD for:  temperature >100.4   Complete by:  As directed    Diet - low sodium heart healthy   Complete by:  As directed    Diet Carb Modified   Complete by:  As directed    Increase activity slowly   Complete by:  As directed      Allergies as of 07/11/2018      Reactions   Aleve [naproxen Sodium] Hives, Itching   Cortisone Swelling   Internal organs   Prednisone Swelling   Makes internal organs swell   Vancomycin Itching      Medication List    STOP taking these medications   escitalopram 20 MG tablet Commonly known as:  LEXAPRO   hydrOXYzine 10 MG tablet Commonly known as:  ATARAX/VISTARIL   metFORMIN 500 MG 24 hr tablet Commonly known as:  GLUCOPHAGE-XR   mupirocin ointment 2 % Commonly known as:  BACTROBAN   Potassium Gluconate 595 MG Caps     TAKE these medications   albuterol 108 (90 Base) MCG/ACT inhaler Commonly known as:  PROVENTIL HFA;VENTOLIN HFA Inhale 2 puffs into the lungs every 6 (six) hours as needed for wheezing or shortness of breath.   aspirin 325 MG tablet Take 325 mg by mouth daily.   atorvastatin 40 MG tablet Commonly known as:  LIPITOR TAKE 1 TABLET DAILY   clopidogrel 75 MG tablet Commonly known as:  PLAVIX TAKE 1 TABLET DAILY What changed:  when to take this   furosemide 20 MG tablet Commonly known as:  LASIX TAKE 1 TABLET DAILY   insulin aspart 100 UNIT/ML injection Commonly known as:  novoLOG Inject 0-15 Units into the skin 3 (three) times daily with meals.   insulin aspart 100 UNIT/ML injection Commonly known as:  novoLOG Inject 0-5 Units into the skin at bedtime.   insulin glargine 100 UNIT/ML injection Commonly known as:  LANTUS Inject 0.2 mLs (20 Units total) into the skin at bedtime.   levothyroxine 100 MCG tablet Commonly known as:  SYNTHROID, LEVOTHROID TAKE (1) TABLET DAILY BE- FORE BREAKFAST. What changed:  See the new instructions.   losartan 50 MG tablet Commonly known as:  COZAAR TAKE 1 TABLET  DAILY   omeprazole 20 MG capsule Commonly known as:  PRILOSEC TAKE (1) CAPSULE DAILY What changed:  See the new instructions.   ONE TOUCH ULTRA TEST test strip Generic drug:  glucose blood TEST BLOOD SUGARS TWICE A DAY   oxyCODONE 5 MG immediate release tablet Commonly known as:  Oxy IR/ROXICODONE Take 1-2 tablets (5-10 mg total) by mouth every 4 (four) hours as needed for moderate pain.   polyethylene glycol packet Commonly known as:  MIRALAX / GLYCOLAX Take 17 g by mouth daily as needed for moderate constipation.   senna 8.6 MG Tabs tablet Commonly known as:  SENOKOT Take 1 tablet (8.6 mg total) by mouth 2 (two) times daily.   tretinoin 0.05 % cream Commonly known as:  RETIN-A APPLY TO AFFECTED AREAS AT BEDTIME What changed:  See the new instructions.   umeclidinium bromide 62.5 MCG/INH Aepb Commonly known as:  INCRUSE ELLIPTA Inhale 1 puff into the lungs daily.   Vitamin D3 125 MCG (5000 UT) Tabs Take 1 tablet by mouth daily.  warfarin 5 MG tablet Commonly known as:  COUMADIN Take as directed. If you are unsure how to take this medication, talk to your nurse or doctor. Original instructions:  Take 1 tablet (5 mg total) by mouth daily at 6 PM for 1 dose. What changed:  See the new instructions.       Follow-up Information    [name], MD In 1 week.   Specialty:  Orthopedic Surgery Contact information: [address] [place] [address] [phone]          Allergies  Allergen Reactions  . Aleve [Naproxen Sodium] Hives and Itching  . Cortisone Swelling    Internal organs  . Prednisone Swelling    Makes internal organs swell  . Vancomycin Itching    Consultations:  Orthopedic/vascular surgery   Procedures/Studies: Dg Toe Great Right  Result Date: 07/04/2018 CLINICAL DATA:  Diabetic infection of the right foot. EXAM: RIGHT GREAT TOE COMPARISON:  Radiographs dated 09/01/2006 FINDINGS: There is erosion of the dorsal aspect of  the tip of the tuft of the distal phalanx of the right great toe as compared to the prior exam. There has been previous amputation of the fourth and fifth toes at the level of the base of the proximal phalanges. This is unchanged. The other bones of the visualized portion of the forefoot appear normal. IMPRESSION: Focal osteomyelitis of the tip of the tuft of the distal phalanx of the great toe. Electronically Signed   By: Lorriane Shire M.D.   On: 07/04/2018 09:15   Vas [place] Burnard Bunting With/wo Tbi  Result Date: 07/06/2018 LOWER EXTREMITY DOPPLER STUDY Indications: Gangrene, and PVD ; Establish baseline vascular status. High Risk Factors: Hypertension, hyperlipidemia, Diabetes. Other Factors: Cigerrate.  Vascular Interventions: Femoral bypass x5. Comparison Study: ABI/TBI on 04/24/2014 Performing Technologist: Rudell Cobb RDMS RVT  Examination Guidelines: A complete evaluation includes at minimum, Doppler waveform signals and systolic blood pressure reading at the level of bilateral brachial, anterior tibial, and posterior tibial arteries, when vessel segments are accessible. Bilateral testing is considered an integral part of a complete examination. Photoelectric Plethysmograph (PPG) waveforms and toe systolic pressure readings are included as required and additional duplex testing as needed. Limited examinations for reoccurring indications may be performed as noted.  ABI Findings: +---------+------------------+-----+----------+-------------+ Right    Rt Pressure (mmHg)IndexWaveform  Comment       +---------+------------------+-----+----------+-------------+ Brachial 165                    triphasic               +---------+------------------+-----+----------+-------------+ ATA      71                0.43 monophasiccalf pressure +---------+------------------+-----+----------+-------------+ PTA      74                0.45 monophasic               +---------+------------------+-----+----------+-------------+ DP       81                0.49 monophasic              +---------+------------------+-----+----------+-------------+ Great Toe66                0.40                         +---------+------------------+-----+----------+-------------+ +---------+------------------+-----+-------------+------------------------+ Left     Lt Pressure (  mmHg)IndexWaveform     Comment                  +---------+------------------+-----+-------------+------------------------+ Brachial 163                    triphasic                             +---------+------------------+-----+-------------+------------------------+ ATA      108               0.65 biphasic     Calf pressure            +---------+------------------+-----+-------------+------------------------+ PTA      136               0.82 monophasic                            +---------+------------------+-----+-------------+------------------------+ DP       99                0.60 monophasic                            +---------+------------------+-----+-------------+------------------------+ Great Toe52                0.32 Almost absentwaveform is very flatten +---------+------------------+-----+-------------+------------------------+ +-------+-----------+-----------+------------+------------+ ABI/TBIToday's ABIToday's TBIPrevious ABIPrevious TBI +-------+-----------+-----------+------------+------------+ Right  0.49       0.40       0.39        0.40         +-------+-----------+-----------+------------+------------+ Left   0.82       0.32       0.78        0.32         +-------+-----------+-----------+------------+------------+ Right Calf-Brachial Index=0.43 Left Calf-Brachial Index=0.65. Right ABIs and TBIs appear increased compared to prior study on 04/24/2014. Left ABIs appear decreased compared to prior study on 04/24/2014. Bilateral TBIs essentially  unchanged compares to previous study on 2015  Summary: Right: Resting right ankle-brachial index indicates severe right lower extremity arterial disease. The right toe-brachial index is abnormal. Right Calf-Brachial Index=0.43  Left: Resting left ankle-brachial index indicates mild left lower extremity arterial disease. The left toe-brachial index is abnormal. Left Calf-Brachial Index=0.65  *See table(s) above for measurements and observations.  Electronically signed by Deitra Mayo MD on 07/06/2018 at 6:40:41 AM.    Final      Subjective: Patient seen and examined at bedside.  No overnight fever, nausea, vomiting.  No worsening extremity pain  Discharge Exam: Vitals:   07/10/18 2115 07/11/18 0447  BP: (!) 118/46 133/68  Pulse: 91 74  Resp:  18  Temp: 98.2 F (36.8 C) 98.5 F (36.9 C)  SpO2: 95% 95%   Vitals:   07/10/18 0410 07/10/18 1528 07/10/18 2115 07/11/18 0447  BP: (!) 158/80 130/71 (!) 118/46 133/68  Pulse: 91 98 91 74  Resp:  20  18  Temp: 98.3 F (36.8 C) (!) 97.4 F (36.3 C) 98.2 F (36.8 C) 98.5 F (36.9 C)  TempSrc: Oral Oral Oral Oral  SpO2: 96% 98% 95% 95%  Weight:      Height:        General: Pt is alert, awake, not in acute distress Cardiovascular: rate controlled, S1/S2 + Respiratory: bilateral decreased breath sounds at bases Abdominal: Soft, NT, ND, bowel sounds + Extremities: no edema, no cyanosis.  Right transtibial amputation with VAC dressing in place.      The results of significant diagnostics from this hospitalization (including imaging, microbiology, ancillary and laboratory) are listed below for reference.     Microbiology: Recent Results (from the past 240 hour(s))  Culture, blood (Routine x 2)     Status: None   Collection Time: 07/04/18 12:09 PM  Result Value Ref Range Status   Specimen Description BLOOD LEFT HAND  Final   Special Requests   Final    BOTTLES DRAWN AEROBIC ONLY Blood Culture adequate volume Performed at [facility], [address], [place], [place] [address]    Culture NO GROWTH 5 DAYS  Final   Report Status 07/09/2018 FINAL  Final  Culture, blood (Routine x 2)     Status: None   Collection Time: 07/04/18  5:20 PM  Result Value Ref Range Status   Specimen Description BLOOD RIGHT ANTECUBITAL  Final   Special Requests   Final    BOTTLES DRAWN AEROBIC AND ANAEROBIC Blood Culture adequate volume Performed at [facility], [place] [address], [place], [place] [address]    Culture NO GROWTH 5 DAYS  Final   Report Status 07/09/2018 FINAL  Final  Surgical pcr screen     Status: None   Collection Time: 07/08/18  7:44 AM  Result Value Ref Range Status   MRSA, PCR NEGATIVE NEGATIVE Final   Staphylococcus aureus NEGATIVE NEGATIVE Final    Comment: (NOTE) The Xpert SA Assay (FDA approved for NASAL specimens in patients 38 years of age and older), is one component of a comprehensive surveillance program. It is not intended to diagnose infection nor to guide or monitor treatment. Performed at [facility], [place] [address] [place], [place] [address]      Labs: BNP (last 3 results) No results for input(s): BNP in the last 8760 hours. Basic Metabolic Panel: Recent Labs  Lab 07/04/18 1209 07/04/18 1641 07/08/18 0432  NA 139 137 143  K 4.2 3.8 4.4  CL 98 98 108  CO2 26 28 27   GLUCOSE 203* 157* 223*  BUN 11 12 16   CREATININE 0.78 0.75 0.97  CALCIUM 9.6 9.3 9.0  MG  --   --  1.7   Liver Function Tests: Recent Labs  Lab 07/04/18 1209 07/04/18 1641  AST 20 20  ALT 22 19  ALKPHOS 57 48  BILITOT 0.3 0.5  PROT 7.8 7.2  ALBUMIN 3.5 3.2*   No results for input(s): LIPASE, AMYLASE in the last 168 hours. No results for input(s): AMMONIA in the last 168 hours. CBC: Recent Labs  Lab 07/04/18 1209 07/04/18 1641 07/08/18 0432  WBC 10.9* 11.1* 10.7*  NEUTROABS 5.5 5.7 5.1  HGB 15.3* 14.7 14.6  HCT 48.8* 45.7 44.2  MCV 90.7 88.6 88.8  PLT 231 236 205   Cardiac  Enzymes: Recent Labs  Lab 07/05/18 0213  CKTOTAL 87   BNP: Invalid input(s): POCBNP CBG: Recent Labs  Lab 07/10/18 0835 07/10/18 1330 07/10/18 1751 07/10/18 2117 07/11/18 0758  GLUCAP 163* 154* 174* 170* 162*   D-Dimer No results for input(s): DDIMER in the last 72 hours. Hgb A1c No results for input(s): HGBA1C in the last 72 hours. Lipid Profile No results for input(s): CHOL, HDL, LDLCALC, TRIG, CHOLHDL, LDLDIRECT in the last 72 hours. Thyroid function studies No results for input(s): TSH, T4TOTAL, T3FREE, THYROIDAB in the last 72 hours.  Invalid input(s): FREET3 Anemia work up No results for  input(s): VITAMINB12, FOLATE, FERRITIN, TIBC, IRON, RETICCTPCT in the last 72 hours. Urinalysis    Component Value Date/Time   COLORURINE YELLOW 12/08/2006 0927   APPEARANCEUR Clear 09/25/2015 1558   LABSPEC 1.013 12/08/2006 0927   PHURINE 6.5 12/08/2006 0927   GLUCOSEU Negative 09/25/2015 1558   HGBUR NEGATIVE 12/08/2006 0927   BILIRUBINUR Negative 09/25/2015 1558   KETONESUR NEGATIVE 12/08/2006 0927   PROTEINUR Negative 09/25/2015 1558   PROTEINUR NEGATIVE 12/08/2006 0927   UROBILINOGEN negative 04/24/2015 1454   UROBILINOGEN 0.2 12/08/2006 0927   NITRITE Negative 09/25/2015 1558   NITRITE NEGATIVE 12/08/2006 0927   LEUKOCYTESUR Negative 09/25/2015 1558   Sepsis Labs Invalid input(s): PROCALCITONIN,  WBC,  LACTICIDVEN Microbiology Recent Results (from the past 240 hour(s))  Culture, blood (Routine x 2)     Status: None   Collection Time: 07/04/18 12:09 PM  Result Value Ref Range Status   Specimen Description BLOOD LEFT HAND  Final   Special Requests   Final    BOTTLES DRAWN AEROBIC ONLY Blood Culture adequate volume Performed at [facility], [place] [address] [place], [place] [address]    Culture NO GROWTH 5 DAYS  Final   Report Status 07/09/2018 FINAL  Final  Culture, blood (Routine x 2)     Status: None   Collection Time: 07/04/18  5:20 PM  Result Value  Ref Range Status   Specimen Description BLOOD RIGHT ANTECUBITAL  Final   Special Requests   Final    BOTTLES DRAWN AEROBIC AND ANAEROBIC Blood Culture adequate volume Performed at [facility], [place] [address], [place], [place] [address]    Culture NO GROWTH 5 DAYS  Final   Report Status 07/09/2018 FINAL  Final  Surgical pcr screen     Status: None   Collection Time: 07/08/18  7:44 AM  Result Value Ref Range Status   MRSA, PCR NEGATIVE NEGATIVE Final   Staphylococcus aureus NEGATIVE NEGATIVE Final    Comment: (NOTE) The Xpert SA Assay (FDA approved for NASAL specimens in patients 68 years of age and older), is one component of a comprehensive surveillance program. It is not intended to diagnose infection nor to guide or monitor treatment. Performed at [facility], [place] [address] [place], [place] [address]      Time coordinating discharge: 35 minutes  SIGNED:   Aline August, MD  [facility] 07/11/2018, 10:09 AM Pager: [phone]  If 7PM-7AM, please contact night-coverage [email or web address] Password [email or web address]

## 2018-07-11 NOTE — Consult Note (Signed)
            [facility] CM Primary Care Navigator  07/11/2018  [name] 1961/04/02 [id number]   Seenpatient at the bedside toidentify possible discharge needs.  Patient reportsthatshe presented to the hospital due to chronic non-healing wound of right great toe with purulent drainage; had been seen by primary care provider and was admitted for right great toe osteomyelitis. (status post transtibial amputation with a wound vac)  Patientendorses Dr. Redge Gainer with [facility] as her primary care provider.   [facility] [place] [facility].  Patientstates that she has beenmanaging hermedications at [facility] use of "pill box" system filled once a month.  Patientreports that she has been driving prior to admission/ surgery but daughter Loree Fee) will be able to provide transportation to her doctors' appointments after discharge.  Patient verbalized living with her husband at home but daughter and one of her best friends (staying at patient's house for now) will be the primary caregivers for her when discharge home.   Anticipated plan for dischargeis[facility] per therapy recommendation.  Patientvoiced understandingto call primaryprovider's office when shereturnsback home,for a post discharge follow-up visitwithin1- 2 weeksor sooner if needs arise.Patient letter (with PCP's contact number) was provided asareminder.   Discussed with patientaboutTHN CM services available for health management andresourcesat homeand she seemed interested about it although she reports being capable and knowledgeable of ways in managing health conditions like DM (husband is also diabetic). Patientplans todiscuss with primary care provider onhernext visit,aboutfurther needsand assistance in managing her health needs and issues- once she hegetsback to  home. Patient mentioned being on nicotine patch to help quit smoking.   Patientvoicedunderstandingto seekreferral from primary care provider to [facility] care management ifdeemed necessary and appropriatefor anyservicesin the nearfuture- when she returns to home.   [facility] care management information was provided for future needs that shemay have.  Primary care provider's office is listed as providing transition of care.     For additional questions please contact:  Edwena Felty A. Evadne Ose, BSN, RN-BC [facility] PRIMARY CARE Navigator Cell: [phone]

## 2018-07-11 NOTE — Progress Notes (Signed)
Meredith Staggers, MD  Physician  Physical Medicine and Rehabilitation  PMR Pre-admission  Signed  Date of Service:  07/11/2018 1:34 PM       Related encounter: ED to Hosp-Admission (Current) from 07/04/2018 in [facility]      Signed        [employer] PMR Admission Coordinator Pre-Admission Assessment  Patient: [name] is an 58 y.o., female MRN: [id number] DOB: 1961-02-04 Height: 5' 10" (177.8 cm) Weight: 104.8 kg  Insurance Information HMO:     PPO:      PCP:      IPA:      80/20: Yes     OTHER:  PRIMARY: Medicare A and B      Policy#: [id number]     Subscriber: Patient CM Name:       Phone#:      Fax#:  Pre-Cert#:       Employer:  Benefits:  Phone #: NA     Name: Verified eligibility via [employer] on 07/11/18 Eff. Date: Part A: 04/08/16; Part B: 04/08/16     Deduct: $1,408      Out of Pocket Max: NA      Life Max: NA CIR: Covered per Medicare Guidelines once yearly deductible is met      SNF: days 1-20, 100%; days 21-100, 80% Outpatient: 80%     Co-Pay: 20% Home Health: 100%      Co-Pay:  DME: 80%     Co-Pay: 20% Providers: Pt's choice  SECONDARY:       Policy#:       Subscriber:  CM Name:       Phone#:      Fax#:  Pre-Cert#:       Employer:  Benefits:  Phone #:      Name:  Eff. Date:      Deduct:       Out of Pocket Max:       Life Max:  CIR:       SNF:  Outpatient:     Co-Pay:  Home Health:       Co-Pay:  DME:      Co-Pay:  Medicaid Application Date:       Case Manager:  Disability Application Date:       Case Worker:   Emergency Contact Information         Contact Information    Name Relation Home Work Mobile   [place] Spouse [phone]  [phone]      Current Medical History  Patient Admitting Diagnosis: Transtibial amputation 07/08/18 History of Present Illness: Pt is a 58 yo Female with history of COPD, DM2, DVT, GERD, Hyperlipidemia, HTN, Hypothyroidism, obesity, PAD with prior toe amputations  and depression. Pt was admitted on 07/04/18 due to a chronic nonhealing wound on right foot, showing right great toe osteomyelitis. Pt underwent a right transtibial amputation on 07/08/18 by Dr. Sharol Given. Pt has wound vac. Pt was evaluated by therapies with recommendations for CIR. Pt is to be admitted to CIR on 07/11/18.    Patient's medical record from [facility] has been reviewed by the rehabilitation admission coordinator and physician.  Past Medical History      Past Medical History:  Diagnosis Date  . COPD (chronic obstructive pulmonary disease) ([place])   . Depression   . DM (diabetes mellitus) ([place])   . DVT (deep venous thrombosis) ([facility])    x5  . Family history of colon cancer   .  GERD (gastroesophageal reflux disease)   . Hyperlipemia   . Hypertension   . Hypothyroid   . Obesity   . PVD (peripheral vascular disease) ([place])   . Uterine cancer ([place])   . UTI (urinary tract infection)     Family History   family history includes Clotting disorder in her brother; Colon cancer (age of onset: 20) in her brother; Diabetes in her brother and father; Heart disease in her brother and father; Hyperlipidemia in her father; Hypertension in her mother; Hypothyroidism in her mother.  Prior Rehab/Hospitalizations Has the patient had major surgery during 100 days prior to admission? No               Current Medications  Current Facility-Administered Medications:  .  acetaminophen (TYLENOL) tablet 325-650 mg, 325-650 mg, Oral, Q6H PRN, Rayburn, Shawn Montgomery, PA-C .  albuterol (PROVENTIL) (2.5 MG/3ML) 0.083% nebulizer solution 3 mL, 3 mL, Inhalation, Q6H PRN, Lady Deutscher, MD .  aspirin tablet 325 mg, 325 mg, Oral, Daily, Alekh, Kshitiz, MD, 325 mg at 07/11/18 1211 .  atorvastatin (LIPITOR) tablet 40 mg, 40 mg, Oral, Daily, Lady Deutscher, MD, 40 mg at 07/11/18 0909 .  bisacodyl (DULCOLAX) EC tablet 5 mg, 5 mg, Oral, Once, Blount, Scarlette Shorts T, NP .  cholecalciferol  (VITAMIN D3) tablet 5,000 Units, 5,000 Units, Oral, Daily, Lady Deutscher, MD, 5,000 Units at 07/11/18 [phone]  clopidogrel (PLAVIX) tablet 75 mg, 75 mg, Oral, Daily, Alekh, Kshitiz, MD, 75 mg at 07/11/18 1211 .  diphenhydrAMINE (BENADRYL) capsule 25 mg, 25 mg, Oral, Q6H PRN, Opyd, Ilene Qua, MD, 25 mg at 07/09/18 2155 .  docusate sodium (COLACE) capsule 100 mg, 100 mg, Oral, BID, Rayburn, Shawn Montgomery, PA-C, 100 mg at 07/11/18 0909 .  enoxaparin (LOVENOX) injection 40 mg, 40 mg, Subcutaneous, Q24H, Rayburn, Shawn Montgomery, PA-C, 40 mg at 07/11/18 0909 .  furosemide (LASIX) tablet 20 mg, 20 mg, Oral, Daily, Lady Deutscher, MD, 20 mg at 07/11/18 0909 .  HYDROmorphone (DILAUDID) injection 0.5 mg, 0.5 mg, Intravenous, Q2H PRN, Rayburn, Neta Mends, PA-C, 0.5 mg at 07/10/18 0620 .  insulin aspart (novoLOG) injection 0-15 Units, 0-15 Units, Subcutaneous, TID WC, Hongalgi, Lenis Dickinson, MD, 3 Units at 07/11/18 1255 .  insulin aspart (novoLOG) injection 0-5 Units, 0-5 Units, Subcutaneous, QHS, Hongalgi, Lenis Dickinson, MD, 2 Units at 07/05/18 2224 .  insulin glargine (LANTUS) injection 20 Units, 20 Units, Subcutaneous, QHS, Alekh, Kshitiz, MD, 20 Units at 07/10/18 2144 .  lactated ringers infusion, , Intravenous, Continuous, Belinda Block, MD, Last Rate: 10 mL/hr at 07/09/18 1431 .  levothyroxine (SYNTHROID, LEVOTHROID) tablet 100 mcg, 100 mcg, Oral, Daily, Lady Deutscher, MD, 100 mcg at 07/11/18 8372 .  losartan (COZAAR) tablet 50 mg, 50 mg, Oral, Daily, Lady Deutscher, MD, 50 mg at 07/11/18 0909 .  metoCLOPramide (REGLAN) tablet 5-10 mg, 5-10 mg, Oral, Q8H PRN **OR** metoCLOPramide (REGLAN) injection 5-10 mg, 5-10 mg, Intravenous, Q8H PRN, Rayburn, Shawn Montgomery, PA-C .  nicotine (NICODERM CQ - dosed in mg/24 hours) patch 21 mg, 21 mg, Transdermal, Daily, Blount, Xenia T, NP, 21 mg at 07/11/18 0910 .  ondansetron (ZOFRAN) tablet 4 mg, 4 mg, Oral, Q6H PRN **OR** ondansetron (ZOFRAN)  injection 4 mg, 4 mg, Intravenous, Q6H PRN, Rayburn, Neta Mends, PA-C .  oxyCODONE (Oxy IR/ROXICODONE) immediate release tablet 5-10 mg, 5-10 mg, Oral, Q4H PRN, Starla Link, Kshitiz, MD, 5 mg at 07/11/18 0926 .  pantoprazole (PROTONIX) EC tablet 40 mg, 40 mg, Oral, Daily,  Lady Deutscher, MD, 40 mg at 07/11/18 3810 .  polyethylene glycol (MIRALAX / GLYCOLAX) packet 17 g, 17 g, Oral, Daily PRN, Starla Link, Kshitiz, MD .  senna (SENOKOT) tablet 8.6 mg, 1 tablet, Oral, Daily, Alekh, Kshitiz, MD, 8.6 mg at 07/11/18 0909 .  umeclidinium bromide (INCRUSE ELLIPTA) 62.5 MCG/INH 1 puff, 1 puff, Inhalation, Daily, Lady Deutscher, MD .  warfarin (COUMADIN) tablet 5 mg, 5 mg, Oral, ONCE-1800, Alekh, Kshitiz, MD .  Warfarin - Physician Dosing Inpatient, , Does not apply, q1800, Aline August, MD  Patients Current Diet:      Diet Order                  Diet - low sodium heart healthy         Diet Carb Modified         Diet heart healthy/carb modified Room service appropriate? Yes; Fluid consistency: Thin  Diet effective now               Precautions / Restrictions Precautions Precautions: Fall Restrictions Weight Bearing Restrictions: No Other Position/Activity Restrictions: per nsg, R limb guard to be worn when up in standing or when ambulating per pt, no order in chart.     Has the patient had 2 or more falls or a fall with injury in the past year?No  Prior Activity Level Community (5-7x/wk): Active PTA; not working, was able to drive.   Prior Functional Level Do you want Prior Function Level of Independence: Independent Comments: has equipment for previous family member wiht fracture from other? Self Care: Did the patient need help bathing, dressing, using the toilet or eating?  Independent  Indoor Mobility: Did the patient need assistance with walking from room to room (with or without device)? Independent  Stairs: Did the patient need assistance with internal  or external stairs (with or without device)? Independent  Functional Cognition: Did the patient need help planning regular tasks such as shopping or remembering to take medications? Independent  Home Equities trader / Equipment Home Assistive Devices/Equipment: None Home Equipment: Shower seat, Grab bars - toilet, Grab bars - tub/shower, Environmental consultant - 2 wheels, Cane - single point, Walker - 4 wheels  Prior Device Use: Indicate devices/aids used by the patient prior to current illness, exacerbation or injury? None of the above  Prior Functional Level Comments: has equipment for previous family member wiht fracture   Prior Functional Level Current Functional Level  Bed Mobility  Independent Independent  Transfers Independent Min G  Mobility - Walk/Wheelchair Independent Min A x2  Mobility - Ambulation/Gait Independent Min A x2  Upper Body Dressing Independent Set up  Lower Body Dressing Independent  Min G  Grooming Independent Set up  Eating/Drinking Independent Mod I (clinical judgement)  Toilet Transfer Independent Min G  Bladder Continence Continent, Independent Continent, min G  Bowel Management  Continent, Independent Continent  Stair Climbing Independent NT  Communication WNL No difficulties  Memory WNL Not formally assessed. Overall cognitive status: [facility] for tasks assessed.   Cooking/Meal Prep  Estate manager/land agent  Independent     Special needs/care consideration BiPAP/CPAP: no CPM: no Continuous Drip IV: lactated ringers infusion Dialysis: no        Days: no Life Vest: no Oxygen: no Special Bed: no Trach Size: no Wound Vac (area): Yes      Location: RLE Skin: surgical incision to RLE; rash to right,  left, posterior                   Bowel mgmt: continent, last BM: 07/05/28 Bladder mgmt: continent Diabetic mgmt: yes  Previous Home Environment Living Arrangements: Spouse/significant  other Available Help at Discharge: Family, Available PRN/intermittently, Available 24 hours/day, Friend(s) Type of Home: House Home Layout: One level Home Access: Stairs to enter, Ramped entrance [employer] Shower/Tub: Multimedia programmer: Standard Bathroom Accessibility: Yes How Accessible: Accessible via walker [place]: No  Discharge Living Setting Plans for Discharge Living Setting: Patient's home, Lives with (comment)(husband) Type of Home at Discharge: House Discharge Home Layout: One level Discharge Home Access: [place] entrance Discharge Bathroom Shower/Tub: Walk-in shower Discharge Bathroom Toilet: Standard Discharge Bathroom Accessibility: Yes How Accessible: Accessible via walker Does the patient have any problems obtaining your medications?: No  Social/Family/Support Systems Patient Roles: Spouse Contact Information: husband is emergency contact: [phone];  Anticipated Caregiver: husband at nighttime; pt has many friends who assist as needed; per pt, her husband works 12 hour days so she will need to be Mod I for most tasks but can call on friends for assist if needed.  Anticipated Caregiver's Contact Information: see above Ability/Limitations of Caregiver: Min A of husband Caregiver Availability: Evenings only Discharge Plan Discussed with Primary Caregiver: No (pt is able to speak on her own behalf; Mod I goals) Was unable to get in touch with pt's husband, left voicemail Is Caregiver In Agreement with Plan?: Yes, per pt.  Does Caregiver/Family have Issues with Lodging/Transportation while Pt is in Rehab?: No  Goals/Additional Needs Patient/Family Goal for Rehab: PT/OT: Mod I Expected length of stay: 5-7 days Cultural Considerations: NA Dietary Needs: Heart Healthy/carb modified; thin liquids Equipment Needs: TBD Pt/Family Agrees to Admission and willing to participate: Yes Program Orientation Provided & Reviewed with Pt/Caregiver  Including Roles  & Responsibilities: Yes(pt)  Barriers to Discharge: Weight bearing restrictions  Patient Condition: I have reviewed medical records from [facility], spoken with CSW, RN, and MD and the patient. I met with patient at the bedside for inpatient rehabilitation assessment.  Patient will benefit from ongoing PT and OT, can actively participate in 3 hours of therapy a day 5 days of the week, and can make measurable gains during the admission.  Patient will also benefit from the coordinated team approach during an Inpatient Acute Rehabilitation admission.  The patient will receive intensive therapy as well as Rehabilitation physician, nursing, social worker, and care management interventions.  Due to safety, skin/wound care, disease management, medical administration, pain management, patient education the patient requires 24 hour a day rehabilitation nursing.  The patient is currently Min G transfers and Min Ax2 with mobility and Min G /set up for basic ADLs.  Discharge setting and therapy post discharge at home with home health is anticipated.  Patient has agreed to participate in the Acute Inpatient Rehabilitation Program and will admit today, 07/11/18.  Preadmission Screen Completed By:  Jhonnie Garner, 07/11/2018 2:11 PM ______________________________________________________________________   Discussed status with Dr. Naaman Plummer on 07/11/18 at 2:10PM and received telephone approval for admission today.  Admission Coordinator:  Jhonnie Garner, time 2:10PM/Date 07/11/18   Assessment/Plan: Diagnosis: right BKA 1. Does the need for close, 24 hr/day  Medical supervision in concert with the patient's rehab needs make it unreasonable for this patient to be served in a less intensive setting? Yes 2. Co-Morbidities requiring supervision/potential complications: wound care, pain 3. Due to bladder management, bowel management, safety, skin/wound  care, disease management, medication  administration, pain management and patient education, does the patient require 24 hr/day rehab nursing? Yes 4. Does the patient require coordinated care of a physician, rehab nurse, PT (1-2 hrs/day, 5 days/week) and OT (1-2 hrs/day, 5 days/week) to address physical and functional deficits in the context of the above medical diagnosis(es)? Yes Addressing deficits in the following areas: balance, endurance, locomotion, strength, transferring, bowel/bladder control, bathing, dressing, feeding, grooming, toileting and psychosocial support 5. Can the patient actively participate in an intensive therapy program of at least 3 hrs of therapy 5 days a week? Yes 6. The potential for patient to make measurable gains while on inpatient rehab is excellent 7. Anticipated functional outcomes upon discharge from inpatients are: modified independent PT, modified independent OT, modified independent SLP 8. Estimated rehab length of stay to reach the above functional goals is: 5-7 days 9. Anticipated D/C setting: Home 10. Anticipated post D/C treatments: [place] therapy 11. Overall Rehab/Functional Prognosis: excellent    RECOMMENDATIONS: This patient's condition is appropriate for continued rehabilitative care in the following setting: CIR Patient has agreed to participate in recommended program. Yes Note that insurance prior authorization may be required for reimbursement for recommended care.  Comment:  Meredith Staggers, MD, [place] [facility]  07/11/2018  Jhonnie Garner 07/11/2018        Revision History

## 2018-07-11 NOTE — Progress Notes (Signed)
CSW notes patient is discharging to CIR today. CSW will sign off as no further needs present.   Percell Locus Jasan Doughtie LCSW [phone]

## 2018-07-12 ENCOUNTER — Inpatient Hospital Stay (HOSPITAL_COMMUNITY): Payer: Medicare Other

## 2018-07-12 ENCOUNTER — Encounter (HOSPITAL_COMMUNITY): Payer: Self-pay | Admitting: Orthopedic Surgery

## 2018-07-12 ENCOUNTER — Inpatient Hospital Stay (HOSPITAL_COMMUNITY): Payer: Medicare Other | Admitting: Physical Therapy

## 2018-07-12 ENCOUNTER — Inpatient Hospital Stay (HOSPITAL_COMMUNITY): Payer: Medicare Other | Admitting: Occupational Therapy

## 2018-07-12 DIAGNOSIS — D62 Acute posthemorrhagic anemia: Secondary | ICD-10-CM

## 2018-07-12 DIAGNOSIS — I10 Essential (primary) hypertension: Secondary | ICD-10-CM

## 2018-07-12 DIAGNOSIS — E1169 Type 2 diabetes mellitus with other specified complication: Secondary | ICD-10-CM

## 2018-07-12 DIAGNOSIS — E669 Obesity, unspecified: Secondary | ICD-10-CM

## 2018-07-12 DIAGNOSIS — S88111A Complete traumatic amputation at level between knee and ankle, right lower leg, initial encounter: Secondary | ICD-10-CM

## 2018-07-12 DIAGNOSIS — D72829 Elevated white blood cell count, unspecified: Secondary | ICD-10-CM

## 2018-07-12 LAB — CBC WITH DIFFERENTIAL/PLATELET
Abs Immature Granulocytes: 0.12 10*3/uL — ABNORMAL HIGH (ref 0.00–0.07)
Basophils Absolute: 0.1 10*3/uL (ref 0.0–0.1)
Basophils Relative: 1 %
Eosinophils Absolute: 0.5 10*3/uL (ref 0.0–0.5)
Eosinophils Relative: 4 %
HCT: 36.8 % (ref 36.0–46.0)
Hemoglobin: 11.9 g/dL — ABNORMAL LOW (ref 12.0–15.0)
IMMATURE GRANULOCYTES: 1 %
Lymphocytes Relative: 28 %
Lymphs Abs: 3.6 10*3/uL (ref 0.7–4.0)
MCH: 28.9 pg (ref 26.0–34.0)
MCHC: 32.3 g/dL (ref 30.0–36.0)
MCV: 89.3 fL (ref 80.0–100.0)
Monocytes Absolute: 1 10*3/uL (ref 0.1–1.0)
Monocytes Relative: 8 %
NEUTROS PCT: 58 %
NRBC: 0 % (ref 0.0–0.2)
Neutro Abs: 7.8 10*3/uL — ABNORMAL HIGH (ref 1.7–7.7)
Platelets: 221 10*3/uL (ref 150–400)
RBC: 4.12 MIL/uL (ref 3.87–5.11)
RDW: 13.5 % (ref 11.5–15.5)
WBC: 13.1 10*3/uL — ABNORMAL HIGH (ref 4.0–10.5)

## 2018-07-12 LAB — COMPREHENSIVE METABOLIC PANEL
ALT: 24 U/L (ref 0–44)
AST: 43 U/L — AB (ref 15–41)
Albumin: 2.8 g/dL — ABNORMAL LOW (ref 3.5–5.0)
Alkaline Phosphatase: 39 U/L (ref 38–126)
Anion gap: 10 (ref 5–15)
BUN: 13 mg/dL (ref 6–20)
CO2: 27 mmol/L (ref 22–32)
Calcium: 8.7 mg/dL — ABNORMAL LOW (ref 8.9–10.3)
Chloride: 101 mmol/L (ref 98–111)
Creatinine, Ser: 0.89 mg/dL (ref 0.44–1.00)
GFR calc Af Amer: >60 mL/min (ref >60)
GFR calc non Af Amer: >60 mL/min (ref >60)
Glucose, Bld: 257 mg/dL — ABNORMAL HIGH (ref 70–99)
Potassium: 4.2 mmol/L (ref 3.5–5.1)
SODIUM: 138 mmol/L (ref 135–145)
Total Bilirubin: 0.3 mg/dL (ref 0.3–1.2)
Total Protein: 6.7 g/dL (ref 6.5–8.1)

## 2018-07-12 LAB — GLUCOSE, CAPILLARY
GLUCOSE-CAPILLARY: 183 mg/dL — AB (ref 70–99)
GLUCOSE-CAPILLARY: 147 mg/dL — AB (ref 70–99)
Glucose-Capillary: 145 mg/dL — ABNORMAL HIGH (ref 70–99)
Glucose-Capillary: 196 mg/dL — ABNORMAL HIGH (ref 70–99)

## 2018-07-12 LAB — PROTIME-INR
INR: 1.06
Prothrombin Time: 13.7 seconds (ref 11.4–15.2)

## 2018-07-12 MED ORDER — ENOXAPARIN SODIUM 40 MG/0.4ML ~~LOC~~ SOLN: 40.0000 mg | SUBCUTANEOUS | Status: DC

## 2018-07-12 MED ORDER — ENOXAPARIN SODIUM 40 MG/0.4ML ~~LOC~~ SOLN
40.0000 mg | SUBCUTANEOUS | Status: DC
  Administered 2018-07-12 – 2018-07-16 (×5): 40 mg via SUBCUTANEOUS
  Filled 2018-07-12 (×5): qty 0.4

## 2018-07-12 MED ORDER — SIMETHICONE 40 MG/0.6ML PO SUSP
40.0000 mg | ORAL | Status: DC | PRN
  Administered 2018-07-12 – 2018-07-13 (×2): 40 mg via ORAL
  Filled 2018-07-12 (×3): qty 0.6

## 2018-07-12 MED ORDER — WARFARIN - PHARMACIST DOSING INPATIENT: Freq: Every day | Status: DC

## 2018-07-12 MED ORDER — NICOTINE 21 MG/24HR TD PT24: 21.0000 mg | MEDICATED_PATCH | Freq: Every day | TRANSDERMAL | 0 refills | Status: DC

## 2018-07-12 MED ORDER — WARFARIN SODIUM 5 MG PO TABS: 5.0000 mg | ORAL_TABLET | Freq: Once | ORAL | Status: DC

## 2018-07-12 MED ORDER — WARFARIN SODIUM 5 MG PO TABS
5.0000 mg | ORAL_TABLET | Freq: Once | ORAL | Status: AC
  Administered 2018-07-12: 5 mg via ORAL
  Filled 2018-07-12: qty 1

## 2018-07-12 NOTE — IPOC Note (Signed)
Overall Plan of Care [facility]) Patient Details Name: [name] MRN: [id number] DOB: 1961-05-24  Admitting Diagnosis: Right BKA  Hospital Problems: Active Problems:   Unilateral complete BKA, right, initial encounter ([place])   Leukocytosis   Acute blood loss anemia   Diabetes mellitus type 2 in obese [facility])     Functional Problem List: Nursing Behavior, Bladder, Bowel, Edema, Endurance, Medication Management, Pain, Safety, Skin Integrity  PT Balance, Edema, Endurance, Pain, Safety, Skin Integrity  OT Balance, Endurance  SLP    TR         Basic ADL's: OT Bathing, Dressing, Toileting     Advanced  ADL's: OT Simple Meal Preparation     Transfers: PT Bed Mobility, Bed to Chair, Car, Furniture, Floor  OT Toilet, Metallurgist: PT Emergency planning/management officer, Ambulation, Stairs     Additional Impairments: OT None  SLP        TR      Anticipated Outcomes Item Anticipated Outcome  Self Feeding no goal, pt is I  Swallowing      Basic self-care  mod I  Toileting  mod I   Bathroom Transfers mod I to toilet, S to shower stall  Bowel/Bladder  patient will be continent of bowel and bladder with min assist  Transfers  modified independent  Locomotion  supervision short distance gait  Communication     Cognition     Pain  pain less than or equal to 4/10 with min assist  Safety/Judgment  patient will be free from falls/injury and displaying sound safety judgement with min aissist   Therapy Plan: PT Intensity: Minimum of 1-2 x/day ,45 to 90 minutes PT Frequency: 5 out of 7 days PT Duration Estimated Length of Stay: 5-7 days  OT Intensity: Minimum of 1-2 x/day, 45 to 90 minutes OT Frequency: 5 out of 7 days OT Duration/Estimated Length of Stay: 5-7 days      Team Interventions: Nursing Interventions Patient/Family Education, Bowel Management, Disease Management/Prevention, Pain Management, Medication Management, Skin Care/Wound Management,  Discharge Planning  PT interventions Ambulation/gait training, Discharge planning, DME/adaptive equipment instruction, Functional mobility training, Pain management, Splinting/orthotics, Psychosocial support, Therapeutic Activities, UE/LE Strength taining/ROM, Wheelchair propulsion/positioning, UE/LE Coordination activities, Therapeutic Exercise, Stair training, Skin care/wound management, Neuromuscular re-education, Patient/family education, Functional electrical stimulation, Disease management/prevention, Academic librarian, Training and development officer  OT Interventions Training and development officer, Discharge planning, DME/adaptive equipment instruction, Functional mobility training, Self Care/advanced ADL retraining, Therapeutic Activities, Therapeutic Exercise, UE/LE Strength taining/ROM, Skin care/wound managment, Patient/family education  SLP Interventions    TR Interventions    SW/CM Interventions Discharge Planning, Psychosocial Support, Patient/Family Education   Barriers to Discharge MD  Medical stability, Wound care and Weight bearing restrictions  Nursing      PT Decreased caregiver support husband works during day   OT      SLP      SW       Team Discharge Planning: Destination: PT-Home ,OT- Home , SLP-  Projected Follow-up: PT-Home health PT, OT-  None, SLP-  Projected Equipment Needs: PT-To be determined, OT- 3 in 1 bedside comode, SLP-  Equipment Details: PT-has RW, w/c, BSC, and tub bench already , OT-drop arm BSC Patient/family involved in discharge planning: PT- Patient,  OT-Patient, SLP-   MD ELOS: 5-7days. Medical Rehab Prognosis:  Good Assessment: 58 year old female with history of T2DM with neuropathy, DVT, HTN, COPD, tobacco abuse, PVD with with multiple revascularization who was admitted via MD office on 07/05/18 with osteromyelitis right  great toe. She was started on IV antibiotics and ABI showed evidence of severe PAD. Dr. Scot Dock consulted and recommended  amputation as patient as veins flet to be unconstructable. She was evaluated by Dr. Sharol Given and underwent R-BKA on 01/31.  Patient with resulting functional deficits with mobility, transfers, self-care.  We will set goals for Mod I/Supervision with PT/OT.  See Team Conference Notes for weekly updates to the plan of care

## 2018-07-12 NOTE — Progress Notes (Signed)
Occupational Therapy Session Note  Patient Details  Name: [name] MRN: [id number] Date of Birth: Jun 03, 1961  Today's Date: 07/12/2018 OT Individual Time: 1600-1630 OT Individual Time Calculation (min): 30 min    Short Term Goals: Week 1:  OT Short Term Goal 1 (Week 1): STGs = LTGs  Skilled Therapeutic Interventions/Progress Updates:    Session focused on R residual limb care and phantom pain management strategies. Pt with c/o phantom pain at beginning of session. Pt guided through desensitization exercises including rubbing and tapping on residual limb and educated on purpose. Pt competed SPT with RW with CGA to w/c. Pt was transported to therapy gym for time management. Pt was set up with mirror medially with mirror facing L intact limb. Pt guided through visual attention to L leg reflection and instructed in moving L limb. Pt reported almost immediate phantom pain relief to a moderate degree. Pt instructed on how to carryover exercise to home. Pt returned to room and left sitting up in chair with daughter present.    Therapy Documentation Precautions:  Precautions Precautions: Fall Restrictions Weight Bearing Restrictions: Yes RLE Weight Bearing: Non weight bearing Other Position/Activity Restrictions: R limb guard w/ OOB activity   Vital Signs: Therapy Vitals Temp: 98.2 F (36.8 C) Temp Source: Oral Pulse Rate: 75 Resp: 16 BP: 102/61 Patient Position (if appropriate): Sitting Oxygen Therapy SpO2: 97 % O2 Device: Room Air Pain: Pain Assessment Pain Scale: 0-10 Pain Score: 4  Pain Type: Phantom pain Pain Location: Leg Pain Orientation: Right Pain Descriptors / Indicators: Aching Pain Onset: On-going Pain Intervention(s): Guided imagery;Therapeutic touch;Repositioned   Therapy/Group: Individual Therapy  Curtis Sites 07/12/2018, 5:27 PM

## 2018-07-12 NOTE — Progress Notes (Signed)
Per nursing, patient was given "Data Collection Information Summary for Patients in Inpatient Rehabilitation Facilities with attached Privacy Act Statement Health Care Records" upon admission, which was given in the patient education notebook.    Patient information reviewed and entered into e[facility] System by Becky Jamarii Banks, PPS coordinator. Information including medical coding, function ability, and quality indicators will be reviewed and updated through discharge.   

## 2018-07-12 NOTE — Progress Notes (Signed)
Social Work  Social Work Assessment and Plan  Patient Details  Name: [name] MRN: [id number] Date of Birth: 02-01-1961  Today's Date: 07/12/2018  Problem List:  Patient Active Problem List   Diagnosis Date Noted  . Leukocytosis   . Acute blood loss anemia   . Diabetes mellitus type 2 in obese ([place])   . Unilateral complete BKA, right, initial encounter ([place]) 07/11/2018  . Acquired absence of right leg below knee ([place]) 07/08/2018  . Mild protein-calorie malnutrition ([place])   . Osteomyelitis of great toe of right foot ([place]) 07/04/2018  . Intermittent claudication ([place]) 05/11/2017  . Deep vein thrombosis (DVT) of both lower extremities ([place]) 01/05/2017  . Peripheral vascular insufficiency ([place]) 09/25/2015  . Hypothyroidism 01/29/2014  . Obesity (BMI 30-39.9) 08/16/2013  . Impetigo 07/28/2013  . Chronic obstructive pulmonary disease ([place])   . Malignant neoplasm of uterus ([place])   . GERD (gastroesophageal reflux disease) 10/18/2012  . Hernia, hiatal 10/18/2012  . Diabetic infection of right foot ([place]) 04/18/2008  . Hyperlipidemia 04/18/2008  . SMOKER 04/18/2008  . Essential hypertension 04/18/2008   Past Medical History:  Past Medical History:  Diagnosis Date  . COPD (chronic obstructive pulmonary disease) ([place])   . Depression   . DM (diabetes mellitus) ([place])   . DVT (deep venous thrombosis) ([facility])    x5  . Family history of colon cancer   . GERD (gastroesophageal reflux disease)   . Hyperlipemia   . Hypertension   . Hypothyroid   . Obesity   . PVD (peripheral vascular disease) ([place])   . Uterine cancer ([place])   . UTI (urinary tract infection)    Past Surgical History:  Past Surgical History:  Procedure Laterality Date  . ABDOMINAL HYSTERECTOMY    . AMPUTATION Right 07/08/2018   Procedure: RIGHT BELOW KNEE AMPUTATION;  Surgeon: [name], MD;  Location: [facility];  Service: Orthopedics;  Laterality: Right;  . CESAREAN SECTION    . FEMORAL BYPASS     x 5   . LUMBAR DISC SURGERY     L4-L5  . TOE AMPUTATION     right  . TONSILLECTOMY     Social History:  reports that she has been smoking cigarettes. She has a 19.00 pack-year smoking history. She has never used smokeless tobacco. She reports that she does not drink alcohol or use drugs.  Family / Support Systems Marital Status: Married Patient Roles: Spouse, Parent Spouse/Significant Other: Danny [phone] Children: 56 yo daughter who is in and out of the home Other Supports: Friends Anticipated Caregiver: Husband there at night and daughter and friend can check on daily Ability/Limitations of Caregiver: Husband is a Administrator and not there morning or evenings-there at night Caregiver Availability: Other (Comment)(Nights) Family Dynamics: Close knit family their 56 yo daughter comes by daily to see Mom. Husband works as a Administrator and is gone am and pm there at night. Pt will need to be mod/i to be home alone while husband works.  Social History Preferred language: English Religion: Baptist Cultural Background: No issues Education: [employer] Read: Yes Write: Yes Employment Status: Disabled Public relations account executive Issues: No issues Guardian/Conservator: None-according to MD pt is capable of making her own decisions while here   Abuse/Neglect Abuse/Neglect Assessment Can Be Completed: Yes Physical Abuse: Denies Verbal Abuse: Denies Sexual Abuse: Denies Exploitation of patient/patient's resources: Denies Self-Neglect: Denies  Emotional Status Pt's affect, behavior and adjustment status: Pt is motivated and doing well and will only  be here a short amount of time due to high level. She is hopeful she will get her wound vac off before going home. She is getting used to using a wheelchair and the pain but is managing Recent Psychosocial Issues: other health issues were being managed by PCP Psychiatric History: History of depression takes medications for her depression  and feels they help her manage. May benefit from seeing neuro-psych while here if here long enough. She is verbal and open about her concerns. Substance Abuse History: Tobacco unsure if will quit she has tried in the past and not sure she will again. She knows the resources available for this  Patient / Family Perceptions, Expectations & Goals Pt/Family understanding of illness & functional limitations: Pt can explain her amputation and the medical issues surrounding her surgery. She talks with the MD daily and feels she has a good understanding of her treatment plan going forward. Premorbid pt/family roles/activities: Wife, mother, retiree, friend, etc Anticipated changes in roles/activities/participation: resume Pt/family expectations/goals: Pt states: " I am doing well and will not be here long I think and have been told."  Husband states: " I know she will do all that she can for herself she always has."  [employer]: None Premorbid Home Care/DME Agencies: Other (Comment)(has DME from past health issues) Transportation available at discharge: Family Resource referrals recommended: Neuropsychology, Support group (specify)  Discharge Planning Living Arrangements: Spouse/significant other Support Systems: Spouse/significant other, Children, Friends/neighbors Type of Residence: Private residence Insurance Resources: [employer] Financial Resources: SSD, Family Support Financial Screen Referred: No Living Expenses: Own Money Management: Patient, Spouse Does the patient have any problems obtaining your medications?: No Home Management: Self and husband helps when she is unable too Patient/Family Preliminary Plans: Return home with husband who is able to assist at night when there. Their daughter will be checking on her daily if needed. Pt does not feel this will be necessary. PT has told her she will be here for five days due to high level. Social Work Anticipated  Follow Up Needs: HH/OP, Support Group  Clinical Impression Pleasant female who is motivated to do well and recover from her amputation. She seems to be coping appropriately and moving forward. Her husband is supportive and daughter will check on when discharged. Will await therapy evaluations and work on discharge needs.  Elease Hashimoto 07/12/2018, 9:51 AM

## 2018-07-12 NOTE — Progress Notes (Signed)
[facility] PHYSICAL MEDICINE & REHABILITATION PROGRESS NOTE  Subjective/Complaints: Patient seen sitting up in bed this morning.  She states she slept very well overnight.  She states she is ready begin therapies today.  ROS: Denies CP, SOB, N/V/D  Objective: Vital Signs: Blood pressure 111/67, pulse 72, temperature 98.2 F (36.8 C), temperature source Oral, resp. rate 19, height 5\' 10"  (1.778 m), SpO2 99 %. No results found. Recent Labs    07/12/18 0725  WBC 13.1*  HGB 11.9*  HCT 36.8  PLT 221   No results for input(s): NA, K, CL, CO2, GLUCOSE, BUN, CREATININE, CALCIUM in the last 72 hours.  Physical Exam: BP 111/67 (BP Location: Right Arm)   Pulse 72   Temp 98.2 F (36.8 C) (Oral)   Resp 19   Ht 5\' 10"  (1.778 m)   SpO2 99%   BMI 33.15 kg/m  Constitutional: NAD.  Obese. HENT: Normocephalic.  Atraumatic. Eyes:EOMI.  No discharge. Cardiovascular:Normal rateand regular rhythm.  No JVD. Respiratory:Effort normal.  Clear. GI:She exhibitsno distension.  Bowel sounds normal. Musculoskeletal: Compressive dressing with wound VAC on right BKA Neurological: Alert and oriented. Motor: Bilateral upper extremities 5/5 proximal to distal.  Left lower extremity: 5/5 proximal to distal RLE: Hip flexion: 4+/5 (pain inhibition)  Sensation intact light touch  Skin: Skin iswarmand dry. Multiple scars  Psychiatric: She has a normal mood and affect. Herbehavior is normal.  Assessment/Plan: 1. Functional deficits secondary to right BKA which require 3+ hours per day of interdisciplinary therapy in a comprehensive inpatient rehab setting.  Physiatrist is providing close team supervision and 24 hour management of active medical problems listed below.  Physiatrist and rehab team continue to assess barriers to discharge/monitor patient progress toward functional and medical goals  Care Tool:  Bathing              Bathing assist       Upper Body  Dressing/Undressing Upper body dressing        Upper body assist      Lower Body Dressing/Undressing Lower body dressing            Lower body assist       Toileting Toileting    Toileting assist Assist for toileting: Minimal Assistance - Patient > 75%     Transfers Chair/bed transfer  Transfers assist           Locomotion Ambulation   Ambulation assist              Walk 10 feet activity   Assist           Walk 50 feet activity   Assist           Walk 150 feet activity   Assist           Walk 10 feet on uneven surface  activity   Assist           Wheelchair     Assist               Wheelchair 50 feet with 2 turns activity    Assist            Wheelchair 150 feet activity     Assist            Medical Problem List and Plan: 1.Functional and mobility deficitssecondary to PAD, right BKA on 1/31  Begin CIR  Notes reviewed, labs reviewed 2.H/oBLE/DVT/Anticoagulation:Pharmaceutical:Coumadin 3. Pain Management:Oxycodone prn is effective. 4. Mood:LCSW to follow for evaluation and  support. 5. Neuropsych: This patientiscapable of making decisions onherown behalf. 6. Skin/Wound Care:Continue compressive dressing with prevena VAC on R-BKA. D/c VAC on 2/7 7. Fluids/Electrolytes/Nutrition:Monitor I/O   BMP pending 8. STM:HDQQIWL BP bid. Continue lasix and cozaar.  Monitor with increased mobility 9. COPD: Encourage tobacco cessation. Continue Incruse daily.  10. T2DM: Monitor Bs ac/hs. Continue Lantus at bedtime with meal coverage.Off metforminat present Monitor with increased mobility 11. Leucocytosis: Resolving. Monitor for fevers and other sign so infection.  12.PVD: Continue ASA/Plavix and Lipitor. 13. Leukocytosis  WBCs 13.1 on 2/4  Afebrile  Cont to monitor 14. ABLA  Hb 11.9 on 2/4  Cont to monitor  LOS: 1 days A FACE TO FACE  EVALUATION WAS PERFORMED  Ankit Lorie Phenix 07/12/2018, 8:21 AM

## 2018-07-12 NOTE — Progress Notes (Signed)
Occupational Therapy Session Note  Patient Details  Name: [name] MRN: [id number] Date of Birth: 1961-02-27  Today's Date: 07/12/2018 OT Individual Time: 7096-2836 OT Individual Time Calculation (min): 55 min    Short Term Goals: Week 1:  OT Short Term Goal 1 (Week 1): STGs = LTGs  Skilled Therapeutic Interventions/Progress Updates:  Pt seen for OT session focusing on functional transfers and education. Pt sitting up in w/c upon arrival, agreeable to tx session and requesting being taken to sink for grooming tasks. Grooming completed from w/c level mod I. She self propelled w/c throughout unit with supervision for UE strengthening/endurance. She completed stand pivot transfers throughout session with RW and CGA, assist to stabilize equipment due to pt's heavy reliance on UEs during mobility.  Seated EOM, extensive education and demonstration provided regarding inspection mirror use and purpose, importance of maintaining skin integrity and continuum of care for prosthetic training. Education and demonstration also provided regarding use and wear schedule of shrinker. With min A and VCs, pt demonstrates ability to doff and Web designer. Educated regarding pain management techniques for phantom pain.  Pt returned to room at end of session, requesting to use BSC. Min A stand pivot transfer to [facility]. Pt left seated on BSC, educated regarding use of call bell and need for staff assist when finished. Alerted NT of pt's position.   Therapy Documentation Precautions:  Precautions Precautions: Fall Restrictions Weight Bearing Restrictions: Yes RLE Weight Bearing: Non weight bearing Other Position/Activity Restrictions: R limb guard w/ OOB activity Pain: Pain Assessment Pain Score: 0-No pain(has strong phantom limb sensations)   Therapy/Group: Individual Therapy  Logan Vegh L 07/12/2018, 1:53 PM

## 2018-07-12 NOTE — Evaluation (Signed)
Physical Therapy Assessment and Plan  Patient Details  Name: [name] MRN: [id number] Date of Birth: August 18, 1960  PT Diagnosis: Abnormality of gait, Coordination disorder, Difficulty walking and Muscle weakness Rehab Potential: Excellent ELOS: 5-7 days    Today's Date: 07/12/2018 PT Individual Time: 0800-0900 PT Individual Time Calculation (min): 60 min    Problem List:  Patient Active Problem List   Diagnosis Date Noted  . Leukocytosis   . Acute blood loss anemia   . Diabetes mellitus type 2 in obese ([place])   . Unilateral complete BKA, right, initial encounter ([place]) 07/11/2018  . Acquired absence of right leg below knee ([place]) 07/08/2018  . Mild protein-calorie malnutrition ([place])   . Osteomyelitis of great toe of right foot ([place]) 07/04/2018  . Intermittent claudication ([place]) 05/11/2017  . Deep vein thrombosis (DVT) of both lower extremities ([place]) 01/05/2017  . Peripheral vascular insufficiency ([place]) 09/25/2015  . Hypothyroidism 01/29/2014  . Obesity (BMI 30-39.9) 08/16/2013  . Impetigo 07/28/2013  . Chronic obstructive pulmonary disease ([place])   . Malignant neoplasm of uterus ([place])   . GERD (gastroesophageal reflux disease) 10/18/2012  . Hernia, hiatal 10/18/2012  . Diabetic infection of right foot ([place]) 04/18/2008  . Hyperlipidemia 04/18/2008  . SMOKER 04/18/2008  . Essential hypertension 04/18/2008    Past Medical History:  Past Medical History:  Diagnosis Date  . COPD (chronic obstructive pulmonary disease) ([place])   . Depression   . DM (diabetes mellitus) ([place])   . DVT (deep venous thrombosis) ([facility])    x5  . Family history of colon cancer   . GERD (gastroesophageal reflux disease)   . Hyperlipemia   . Hypertension   . Hypothyroid   . Obesity   . PVD (peripheral vascular disease) ([place])   . Uterine cancer ([place])   . UTI (urinary tract infection)    Past Surgical History:  Past Surgical History:  Procedure Laterality Date  . ABDOMINAL HYSTERECTOMY     . AMPUTATION Right 07/08/2018   Procedure: RIGHT BELOW KNEE AMPUTATION;  Surgeon: [name], MD;  Location: [facility];  Service: Orthopedics;  Laterality: Right;  . CESAREAN SECTION    . FEMORAL BYPASS     x 5  . LUMBAR DISC SURGERY     L4-L5  . TOE AMPUTATION     right  . TONSILLECTOMY      Assessment & Plan Clinical Impression: Patient is a 58 year old female with history of T2DM with neuropathy, DVT, HTN, COPD, tobacco abuse, PVD with with multiple revascularization who was admitted via MD office on 07/05/18 with osteromyelitis right great toe. She was started on IV antibiotics and ABI showed evidence of severe PAD. Dr. Scot Dock consulted and recommended amputation as patient as veins flet to be unconstructable. She was evaluated by Dr. Sharol Given and underwent R-BKA on 01/31. Post op pain control improving. Follow up labs showed persistent leucocytosis. Patient transferred to CIR on 07/11/2018 .   Patient currently requires min with mobility secondary to muscle weakness, decreased cardiorespiratoy endurance and decreased standing balance, decreased postural control and decreased balance strategies.  Prior to hospitalization, patient was independent  with mobility and lived with Spouse in a House home.  Home access is  Stairs to enter, Ramped entrance.  Patient will benefit from skilled PT intervention to maximize safe functional mobility, minimize fall risk and decrease caregiver burden for planned discharge home with intermittent assist.  Anticipate patient will benefit from follow up [facility] at discharge.  PT - End of  Session Activity Tolerance: Tolerates < 10 min activity, no significant change in vital signs Endurance Deficit: Yes Endurance Deficit Description: decreased PT Assessment Rehab Potential (ACUTE/IP ONLY): Excellent PT Barriers to Discharge: Decreased caregiver support PT Barriers to Discharge Comments: husband works during day  PT Patient demonstrates impairments in the  following area(s): Balance;Edema;Endurance;Pain;Safety;Skin Integrity PT Transfers Functional Problem(s): Bed Mobility;Bed to Chair;Car;Furniture;Floor PT Locomotion Functional Problem(s): Wheelchair Mobility;Ambulation;Stairs PT Plan PT Intensity: Minimum of 1-2 x/day ,45 to 90 minutes PT Frequency: 5 out of 7 days PT Duration Estimated Length of Stay: 5-7 days  PT Treatment/Interventions: Ambulation/gait training;Discharge planning;DME/adaptive equipment instruction;Functional mobility training;Pain management;Splinting/orthotics;Psychosocial support;Therapeutic Activities;UE/LE Strength taining/ROM;Wheelchair propulsion/positioning;UE/LE Coordination activities;Therapeutic Exercise;Stair training;Skin care/wound management;Neuromuscular re-education;Patient/family education;Functional electrical stimulation;Disease management/prevention;Community reintegration;Balance/vestibular training PT Transfers Anticipated Outcome(s): modified independent PT Locomotion Anticipated Outcome(s): supervision short distance gait PT Recommendation Follow Up Recommendations: Home health PT Patient destination: Home Equipment Recommended: To be determined Equipment Details: has RW, w/c, BSC, and tub bench already   Skilled Therapeutic Intervention  Pt sitting EOB and agreeable to therapy, denies pain at rest. Performed functional mobility as detailed below including bed mobility, transfers, gait w/ RW, and w/c mobility. W/c follow w/ gait, however did not need it, pt clearing floor w/ L foot when hopping. Pt w/ good safety awareness, making sure w/c brakes locked and adjusted her body mechanics prior to every sit<>stand and transfer for safety. Required multiple rest breaks 2/2 fatigue/endurance deficits. Amputee education regarding desensitization techniques and importance of maintaining ROM, will continue to educate as pt limited in her ability to perform techniques 2/2 pain at this time.   Instructed pt in  results of PT evaluation as detailed below, PT POC, rehab potential, rehab goals, and discharge recommendations. Additionally discussed CIR's policies regarding fall safety and use of chair alarm and/or quick release belt. Pt verbalized understanding and in agreement. Ended session in w/c, all needs in reach.  PT Evaluation Precautions/Restrictions Precautions Precautions: Fall Restrictions Weight Bearing Restrictions: Yes RLE Weight Bearing: Non weight bearing Other Position/Activity Restrictions: R limb guard w/ OOB activity Pain Pain Assessment Pain Scale: 0-10 Pain Score: 0-No pain Home Living/Prior Functioning Home Living Living Arrangements: Spouse/significant other Available Help at Discharge: Family;Friend(s);Available PRN/intermittently(husband works during gait, family/friends able to help out during the day when needed) Type of Home: House Home Access: Stairs to enter;Ramped entrance Home Layout: One level Bathroom Shower/Tub: Multimedia programmer: Standard Bathroom Accessibility: Yes  Lives With: Spouse Prior Function Level of Independence: Independent with basic ADLs;Independent with homemaking with ambulation;Independent with gait;Independent with transfers  Able to Take Stairs?: Yes Driving: Yes Vocation: Retired Biomedical scientist: retired Community education officer: Within [place]: Intact  Cognition Overall Cognitive Status: Within Functional Limits for tasks assessed Arousal/Alertness: Awake/alert Orientation Level: Oriented X4 Memory: Appears intact Awareness: Appears intact Problem Solving: Appears intact Safety/Judgment: Appears intact Sensation Sensation Light Touch: Appears Intact(LEs, hypersensitivity at R residual limb incision site) Coordination Gross Motor Movements are Fluid and Coordinated: No Fine Motor Movements are Fluid and Coordinated: Yes Coordination and Movement  Description: gross movements impaired 2/2 new R BKA Motor  Motor Motor: Within Functional Limits  Mobility Bed Mobility Bed Mobility: Rolling Right;Rolling Left;Supine to Sit;Sit to Supine Rolling Right: Supervision/verbal cueing Rolling Left: Supervision/Verbal cueing Supine to Sit: Supervision/Verbal cueing Sit to Supine: Supervision/Verbal cueing Transfers Transfers: Sit to Stand;Stand to Sit;Stand Pivot Transfers Sit to Stand: Contact Guard/Touching assist Stand to Sit: Contact Guard/Touching assist Stand Pivot Transfers: Contact Guard/Touching assist Transfer (Assistive device):  Rolling walker Locomotion  Gait Ambulation: Yes Gait Assistance: Contact Guard/Touching assist Gait Distance (Feet): 10 Feet Assistive device: Rolling walker Gait Gait velocity: decreased Stairs / Additional Locomotion Stairs: No Wheelchair Mobility Wheelchair Mobility: Yes Wheelchair Assistance: Chartered loss adjuster: Both upper extremities Wheelchair Parts Management: Needs assistance Distance: 150'  Trunk/Postural Assessment  Cervical Assessment Cervical Assessment: Exceptions to WFL(forward head) Thoracic Assessment Thoracic Assessment: Within Functional Limits Lumbar Assessment Lumbar Assessment: Exceptions to WFL(posterior pelvic tilt) Postural Control Postural Control: Within Functional Limits  Balance Balance Balance Assessed: Yes Static Sitting Balance Static Sitting - Balance Support: No upper extremity supported;Feet supported Static Sitting - Level of Assistance: 5: Stand by assistance Dynamic Sitting Balance Dynamic Sitting - Balance Support: No upper extremity supported;Feet supported Dynamic Sitting - Level of Assistance: 5: Stand by assistance Static Standing Balance Static Standing - Balance Support: Bilateral upper extremity supported;During functional activity Static Standing - Level of Assistance: 4: Min assist(CGA) Dynamic Standing  Balance Dynamic Standing - Balance Support: During functional activity;Right upper extremity supported;Left upper extremity supported Dynamic Standing - Level of Assistance: 4: Min assist Extremity Assessment      RLE Assessment RLE Assessment: Exceptions to [facility] Passive Range of Motion (PROM) Comments: Limited in knee 2/2 pain General Strength Comments: Unable to assess 2/2 pain, moving extremity against gravity, new R BKA LLE Assessment LLE Assessment: Within Functional Limits    Refer to Care Plan for Long Term Goals  Recommendations for other services: None   Discharge Criteria: Patient will be discharged from PT if patient refuses treatment 3 consecutive times without medical reason, if treatment goals not met, if there is a change in medical status, if patient makes no progress towards goals or if patient is discharged from hospital.  The above assessment, treatment plan, treatment alternatives and goals were discussed and mutually agreed upon: by patient  Hazaiah [place]combe K Willeen Novak 07/12/2018, 10:23 AM

## 2018-07-12 NOTE — Evaluation (Signed)
Occupational Therapy Assessment and Plan  Patient Details  Name: [name] MRN: [id number] Date of Birth: 10/23/1960  OT Diagnosis: muscle weakness (generalized) Rehab Potential: Rehab Potential (ACUTE ONLY): Excellent ELOS: 5-7 days   Today's Date: 07/12/2018 OT Individual Time: 0930-1030 OT Individual Time Calculation (min): 60 min     Problem List:  Patient Active Problem List   Diagnosis Date Noted  . Leukocytosis   . Acute blood loss anemia   . Diabetes mellitus type 2 in obese ([place])   . Unilateral complete BKA, right, initial encounter ([place]) 07/11/2018  . Acquired absence of right leg below knee ([place]) 07/08/2018  . Mild protein-calorie malnutrition ([place])   . Osteomyelitis of great toe of right foot ([place]) 07/04/2018  . Intermittent claudication ([place]) 05/11/2017  . Deep vein thrombosis (DVT) of both lower extremities ([place]) 01/05/2017  . Peripheral vascular insufficiency ([place]) 09/25/2015  . Hypothyroidism 01/29/2014  . Obesity (BMI 30-39.9) 08/16/2013  . Impetigo 07/28/2013  . Chronic obstructive pulmonary disease ([place])   . Malignant neoplasm of uterus ([place])   . GERD (gastroesophageal reflux disease) 10/18/2012  . Hernia, hiatal 10/18/2012  . Diabetic infection of right foot ([place]) 04/18/2008  . Hyperlipidemia 04/18/2008  . SMOKER 04/18/2008  . Essential hypertension 04/18/2008    Past Medical History:  Past Medical History:  Diagnosis Date  . COPD (chronic obstructive pulmonary disease) ([place])   . Depression   . DM (diabetes mellitus) ([place])   . DVT (deep venous thrombosis) ([facility])    x5  . Family history of colon cancer   . GERD (gastroesophageal reflux disease)   . Hyperlipemia   . Hypertension   . Hypothyroid   . Obesity   . PVD (peripheral vascular disease) ([place])   . Uterine cancer ([place])   . UTI (urinary tract infection)    Past Surgical History:  Past Surgical History:  Procedure Laterality Date  . ABDOMINAL HYSTERECTOMY    . AMPUTATION  Right 07/08/2018   Procedure: RIGHT BELOW KNEE AMPUTATION;  Surgeon: [name], MD;  Location: [facility];  Service: Orthopedics;  Laterality: Right;  . CESAREAN SECTION    . FEMORAL BYPASS     x 5  . LUMBAR DISC SURGERY     L4-L5  . TOE AMPUTATION     right  . TONSILLECTOMY      Assessment & Plan Clinical Impression:  [name] is a 58 year old female with history of T2DM with neuropathy, DVT, HTN, COPD, tobacco abuse, PVD with with multiple revascularization who was admitted via MD office on 07/05/18 with osteromyelitis right great toe. She was started on IV antibiotics and ABI showed evidence of severe PAD.  Dr. Scot Dock consulted and recommended amputation as patient as veins flet to be unconstructable.  She was evaluated by Dr. Sharol Given and underwent R-BKA on 01/31.  Post op pain control improving. Follow up labs showed persistent leucocytosis          Patient transferred to CIR on 07/11/2018 .    Patient currently requires min with basic self-care skills secondary to muscle weakness, decreased cardiorespiratoy endurance and decreased standing balance.  Prior to hospitalization, patient was fully independent.  Patient will benefit from skilled intervention to increase independence with basic self-care skills prior to discharge home with care partner.  Anticipate patient will require intermittent supervision and no further OT follow recommended.  OT - End of Session Endurance Deficit: Yes Endurance Deficit Description: decreased OT Assessment Rehab Potential (ACUTE ONLY): Excellent OT  Patient demonstrates impairments in the following area(s): Balance;Endurance OT Basic ADL's Functional Problem(s): Bathing;Dressing;Toileting OT Advanced ADL's Functional Problem(s): Simple Meal Preparation OT Transfers Functional Problem(s): Toilet;Tub/Shower OT Additional Impairment(s): None OT Plan OT Intensity: Minimum of 1-2 x/day, 45 to 90 minutes OT Frequency: 5 out of 7 days OT  Duration/Estimated Length of Stay: 5-7 days OT Treatment/Interventions: Balance/vestibular training;Discharge planning;DME/adaptive equipment instruction;Functional mobility training;Self Care/advanced ADL retraining;Therapeutic Activities;Therapeutic Exercise;UE/LE Strength taining/ROM;Skin care/wound managment;Patient/family education OT Self Feeding Anticipated Outcome(s): no goal, pt is I OT Basic Self-Care Anticipated Outcome(s): mod I OT Toileting Anticipated Outcome(s): mod I OT Bathroom Transfers Anticipated Outcome(s): mod I to toilet, S to shower stall OT Recommendation Patient destination: Home Follow Up Recommendations: None Equipment Recommended: 3 in 1 bedside comode Equipment Details: drop arm BSC   Skilled Therapeutic Intervention Pt seen for initial evaluation and ADL training with a focus on safe functional mobility, balance, and endurance. Pt is very active and was active before her surgery so she is able to do quite a bit for herself now.   She transferred from W/c to [facility] at sink so she could reach the sink better to wash her hair.  Pt bathed and dressed from [facility] at then sink.   Overall she is only needing a small amount of A.  She is tall and a BSC set up for her height.  It is too tall to fit over the toilet, so she prefers just using the [facility].  She could not reach well to cleanse so dropped one arm down so she could be in more of a straddle position and then she was able to cleanse well.  Pt transferred back to wc.  Reviewed OT POC, discussed home set up and recommendations.  Pt in chair with all needs met.  OT Evaluation Precautions/Restrictions  Precautions Precautions: Fall Restrictions Weight Bearing Restrictions: Yes RLE Weight Bearing: Non weight bearing Other Position/Activity Restrictions: R limb guard w/ OOB activity   Pain Pain Assessment Pain Scale: 0-10 Pain Score: 0-No pain(has strong phantom limb sensations) Home Living/Prior Functioning Home  Living Family/patient expects to be discharged to:: Private residence Living Arrangements: Spouse/significant other Available Help at Discharge: Family, Friend(s), Available PRN/intermittently(husband works during gait, family/friends able to help out during the day when needed) Type of Home: House Home Access: Stairs to enter, Ramped entrance Home Layout: One level Bathroom Shower/Tub: Multimedia programmer: Programmer, systems: Yes  Lives With: Spouse Prior Function Level of Independence: Independent with basic ADLs, Independent with homemaking with ambulation, Independent with gait, Independent with transfers  Able to Take Stairs?: Yes Driving: Yes Vocation: Retired Biomedical scientist: retired Pharmacist, hospital Comments: will need a drop arm commode  ADL ADL ADL Comments: CGA with ADL transfers, min A with LB dressing and bathing and toileting Vision Baseline Vision/History: No visual deficits Patient Visual Report: No change from baseline Vision Assessment?: No apparent visual deficits Perception  Perception: Within Functional Limits Praxis Praxis: Intact Cognition Overall Cognitive Status: Within Functional Limits for tasks assessed Arousal/Alertness: Awake/alert Orientation Level: Person;Place;Situation Person: Oriented Place: Oriented Situation: Oriented Year: 2020 Month: February Day of Week: Correct Memory: Appears intact Immediate Memory Recall: Sock;Blue;Bed Memory Recall: Sock;Blue;Bed Memory Recall Sock: Without Cue Memory Recall Blue: Without Cue Memory Recall Bed: Without Cue Awareness: Appears intact Problem Solving: Appears intact Safety/Judgment: Appears intact Sensation Sensation Light Touch: Appears Intact(LEs, hypersensitivity at R residual limb incision site) Hot/Cold: Appears Intact Proprioception: Appears Intact Stereognosis: Appears Intact Coordination Gross Motor Movements are Fluid and Coordinated: No  Fine Motor  Movements are Fluid and Coordinated: Yes Coordination and Movement Description: gross movements impaired 2/2 new R BKA Motor  Motor Motor: Within Functional Limits Mobility  Bed Mobility Bed Mobility: Rolling Right;Rolling Left;Supine to Sit;Sit to Supine Rolling Right: Supervision/verbal cueing Rolling Left: Supervision/Verbal cueing Supine to Sit: Supervision/Verbal cueing Sit to Supine: Supervision/Verbal cueing Transfers Sit to Stand: Contact Guard/Touching assist Stand to Sit: Contact Guard/Touching assist  Trunk/Postural Assessment  Cervical Assessment Cervical Assessment: Exceptions to WFL(forward head) Thoracic Assessment Thoracic Assessment: Within Functional Limits Lumbar Assessment Lumbar Assessment: Exceptions to WFL(posterior pelvic tilt) Postural Control Postural Control: Within Functional Limits  Balance Balance Balance Assessed: Yes Static Sitting Balance Static Sitting - Balance Support: No upper extremity supported;Feet supported Static Sitting - Level of Assistance: 5: Stand by assistance Dynamic Sitting Balance Dynamic Sitting - Balance Support: No upper extremity supported;Feet supported Dynamic Sitting - Level of Assistance: 5: Stand by assistance Static Standing Balance Static Standing - Balance Support: Bilateral upper extremity supported;During functional activity Static Standing - Level of Assistance: 4: Min assist(CGA) Dynamic Standing Balance Dynamic Standing - Balance Support: During functional activity;Right upper extremity supported;Left upper extremity supported Dynamic Standing - Level of Assistance: 4: Min assist Extremity/Trunk Assessment RUE Assessment RUE Assessment: Within Functional Limits LUE Assessment LUE Assessment: Within Functional Limits     Refer to Care Plan for Long Term Goals  Recommendations for other services: None    Discharge Criteria: Patient will be discharged from OT if patient refuses treatment 3 consecutive  times without medical reason, if treatment goals not met, if there is a change in medical status, if patient makes no progress towards goals or if patient is discharged from hospital.  The above assessment, treatment plan, treatment alternatives and goals were discussed and mutually agreed upon: by patient  Crocker 07/12/2018, 11:14 AM

## 2018-07-12 NOTE — Care Management Note (Signed)
Inpatient [facility] Individual Statement of Services  Patient Name:  [name]  Date:  07/12/2018  Welcome to the [facility].  Our goal is to provide you with an individualized program based on your diagnosis and situation, designed to meet your specific needs.  With this comprehensive rehabilitation program, you will be expected to participate in at least 3 hours of rehabilitation therapies Monday-Friday, with modified therapy programming on the weekends.  Your rehabilitation program will include the following services:  Physical Therapy (PT), Occupational Therapy (OT), 24 hour per day rehabilitation nursing, Therapeutic Recreaction (TR), Case Management (Social Worker), Rehabilitation Medicine, Nutrition Services and Pharmacy Services  Weekly team conferences will be held on Wednesday to discuss your progress.  Your Social Worker will talk with you frequently to get your input and to update you on team discussions.  Team conferences with you and your family in attendance may also be held.  Expected length of stay: 5-7 days  Overall anticipated outcome: independent with device and supervision with ambulation  Depending on your progress and recovery, your program may change. Your Social Worker will coordinate services and will keep you informed of any changes. Your Social Worker's name and contact numbers are listed  below.  The following services may also be recommended but are not provided by the [facility] will be made to provide these services after discharge if needed.  Arrangements include referral to agencies that provide these services.  Your insurance has been verified to be:  Medicare Part A & B Your primary doctor is:  Morrie Sheldon  Pertinent information will be shared with your doctor and your insurance  company.  Social Worker:  Ovidio Kin, [place] or ([phone]  Information discussed with and copy given to patient by: Elease Hashimoto, 07/12/2018, 9:55 AM

## 2018-07-12 NOTE — Progress Notes (Signed)
ANTICOAGULATION CONSULT NOTE - Initial Consult  Pharmacy Consult for Coumadin Indication: DVT  Allergies  Allergen Reactions  . Aleve [Naproxen Sodium] Hives and Itching  . Cortisone Swelling    Internal organs  . Prednisone Swelling    Makes internal organs swell  . Vancomycin Itching    Patient Measurements: Height: 5\' 10"  (177.8 cm) IBW/kg (Calculated) : 68.5 Heparin Dosing Weight:   Vital Signs: Temp: 98.2 F (36.8 C) (02/04 0533) Temp Source: Oral (02/04 0533) BP: 111/67 (02/04 0533) Pulse Rate: 72 (02/04 0533)  Labs: Recent Labs    07/12/18 0725  HGB 11.9*  HCT 36.8  PLT 221  LABPROT 13.7  INR 1.06  CREATININE 0.89    Estimated Creatinine Clearance: 93.6 mL/min (by C-G formula based on SCr of 0.89 mg/dL).   Medical History: Past Medical History:  Diagnosis Date  . COPD (chronic obstructive pulmonary disease) ([place])   . Depression   . DM (diabetes mellitus) ([place])   . DVT (deep venous thrombosis) ([facility])    x5  . Family history of colon cancer   . GERD (gastroesophageal reflux disease)   . Hyperlipemia   . Hypertension   . Hypothyroid   . Obesity   . PVD (peripheral vascular disease) ([place])   . Uterine cancer ([place])   . UTI (urinary tract infection)     Medications:  Medications Prior to Admission  Medication Sig Dispense Refill Last Dose  . albuterol (PROVENTIL HFA;VENTOLIN HFA) 108 (90 Base) MCG/ACT inhaler Inhale 2 puffs into the lungs every 6 (six) hours as needed for wheezing or shortness of breath. 1 Inhaler 11 07/03/2018 at Unknown time  . aspirin 325 MG tablet Take 325 mg by mouth daily.   07/04/2018 at Unknown time  . atorvastatin (LIPITOR) 40 MG tablet TAKE 1 TABLET DAILY (Patient taking differently: Take 40 mg by mouth daily. ) 90 tablet 0 07/04/2018 at Unknown time  . Cholecalciferol (VITAMIN D3) 5000 units TABS Take 1 tablet by mouth daily.   07/04/2018 at Unknown time  . clopidogrel (PLAVIX) 75 MG tablet TAKE 1 TABLET DAILY (Patient taking  differently: Take 75 mg by mouth once. ) 90 tablet 0 07/04/2018 at Unknown time  . furosemide (LASIX) 20 MG tablet TAKE 1 TABLET DAILY (Patient taking differently: Take 20 mg by mouth daily. ) 90 tablet 0 07/04/2018 at Unknown time  . insulin aspart (NOVOLOG) 100 UNIT/ML injection Inject 0-15 Units into the skin 3 (three) times daily with meals.     . insulin aspart (NOVOLOG) 100 UNIT/ML injection Inject 0-5 Units into the skin at bedtime.     . insulin glargine (LANTUS) 100 UNIT/ML injection Inject 0.2 mLs (20 Units total) into the skin at bedtime.     Marland Kitchen levothyroxine (SYNTHROID, LEVOTHROID) 100 MCG tablet TAKE (1) TABLET DAILY BE- FORE BREAKFAST. (Patient taking differently: Take 100 mcg by mouth daily. ) 90 tablet 0 07/04/2018 at Unknown time  . losartan (COZAAR) 50 MG tablet TAKE 1 TABLET DAILY (Patient taking differently: Take 50 mg by mouth daily. ) 90 tablet 0 07/04/2018 at Unknown time  . omeprazole (PRILOSEC) 20 MG capsule TAKE (1) CAPSULE DAILY (Patient taking differently: Take 20 mg by mouth daily. ) 90 capsule 0 07/04/2018 at Unknown time  . ONE TOUCH ULTRA TEST test strip TEST BLOOD SUGARS TWICE A DAY 50 each 2 Taking  . oxyCODONE (OXY IR/ROXICODONE) 5 MG immediate release tablet Take 1-2 tablets (5-10 mg total) by mouth every 4 (four) hours as needed for moderate  pain.     . polyethylene glycol (MIRALAX / GLYCOLAX) packet Take 17 g by mouth daily as needed for moderate constipation.     . senna (SENOKOT) 8.6 MG TABS tablet Take 1 tablet (8.6 mg total) by mouth 2 (two) times daily.     Marland Kitchen tretinoin (RETIN-A) 0.05 % cream APPLY TO AFFECTED AREAS AT BEDTIME (Patient taking differently: Apply 1 application topically at bedtime. To affected areas.) 20 g 0 07/03/2018 at Unknown time  . umeclidinium bromide (INCRUSE ELLIPTA) 62.5 MCG/INH AEPB Inhale 1 puff into the lungs daily. 30 each 3 07/03/2018 at Unknown time  . warfarin (COUMADIN) 5 MG tablet Take 1 tablet (5 mg total) by mouth daily at 6 PM for 1  dose.      Scheduled:  . aspirin  325 mg Oral Daily  . atorvastatin  40 mg Oral Daily  . cholecalciferol  5,000 Units Oral Daily  . clopidogrel  75 mg Oral Daily  . enoxaparin (LOVENOX) injection  40 mg Subcutaneous Q24H  . furosemide  20 mg Oral Daily  . insulin aspart  0-15 Units Subcutaneous TID WC  . insulin aspart  0-5 Units Subcutaneous QHS  . insulin glargine  20 Units Subcutaneous QHS  . levothyroxine  100 mcg Oral Daily  . losartan  50 mg Oral Daily  . nicotine  21 mg Transdermal Daily  . pantoprazole  40 mg Oral Daily  . senna  1 tablet Oral Daily  . umeclidinium bromide  1 puff Inhalation Daily  . warfarin  5 mg Oral ONCE-1800  . Warfarin - Pharmacist Dosing Inpatient   Does not apply q1800   Infusions:    Assessment: Pt just got tx from 5W at [facility] after a BKA. She was on Coumadin while in [facility], however, she rarely gets her INR checked there. Coumadin has been ordered to be resumed here. D/w with Dr. Posey Pronto, we will put her on DVT px lovenox until INR is therapeutic.   Goal of Therapy:  INR 2-3 Monitor platelets by anticoagulation protocol: Yes   Plan:   Coumadin 5mg  PO x1 Lovenox 40mg  SQ qday Daily INR  Onnie Boer, PharmD, BCIDP, AAHIVP, CPP Infectious Disease Pharmacist 07/12/2018 9:09 AM

## 2018-07-13 ENCOUNTER — Inpatient Hospital Stay (HOSPITAL_COMMUNITY): Payer: Medicare Other | Admitting: Occupational Therapy

## 2018-07-13 ENCOUNTER — Inpatient Hospital Stay (HOSPITAL_COMMUNITY): Payer: Medicare Other

## 2018-07-13 DIAGNOSIS — R791 Abnormal coagulation profile: Secondary | ICD-10-CM

## 2018-07-13 DIAGNOSIS — Z91199 Patient's noncompliance with other medical treatment and regimen due to unspecified reason: Secondary | ICD-10-CM

## 2018-07-13 DIAGNOSIS — Z9119 Patient's noncompliance with other medical treatment and regimen: Secondary | ICD-10-CM

## 2018-07-13 LAB — GLUCOSE, CAPILLARY
Glucose-Capillary: 151 mg/dL — ABNORMAL HIGH (ref 70–99)
Glucose-Capillary: 150 mg/dL — ABNORMAL HIGH (ref 70–99)
Glucose-Capillary: 165 mg/dL — ABNORMAL HIGH (ref 70–99)
Glucose-Capillary: 259 mg/dL — ABNORMAL HIGH (ref 70–99)

## 2018-07-13 MED ORDER — WARFARIN SODIUM 5 MG PO TABS
5.0000 mg | ORAL_TABLET | Freq: Once | ORAL | Status: AC
  Administered 2018-07-13: 5 mg via ORAL
  Filled 2018-07-13: qty 1

## 2018-07-13 MED ORDER — ASPIRIN EC 81 MG PO TBEC
81.0000 mg | DELAYED_RELEASE_TABLET | Freq: Every day | ORAL | Status: DC
  Administered 2018-07-14 – 2018-07-16 (×3): 81 mg via ORAL
  Filled 2018-07-13 (×3): qty 1

## 2018-07-13 NOTE — Progress Notes (Addendum)
Physical Therapy Session Note  Patient Details  Name: [name] MRN: [id number] Date of Birth: December 24, 1960  Today's Date: 07/13/2018 PT Individual Time: 0900-0955and 1300-1360 PT Individual Time Calculation (min): 55 min and 60 min  Short Term Goals: Week 1:  PT Short Term Goal 1 (Week 1): =LTGs due to ELOS  Skilled Therapeutic Interventions/Progress Updates:   tx 1:  Pt seated in w/c, with limb protector donned.  She stated that she was tired from her shower this AM.  No pain reported.  She stated that she has constant heartburn, and she burped loudly several times during session.  PT urged her to continue discussing this with her MD and RN.  W/c propulsion using bil LUEs x 100' with supervision, cues for turns.  Pt stated that her house is accessible to w/c except for doorway to BR.  W/c parts mgt with hand over hand assistance to manipulate footrests.  Sit>< stand with RW, CGA, cues for scooting out and sitting slowly.  Gait training with RW over level tile x 10' with CGA; cues and demo for bil shoulder depression to avoid jarring effect of hopping; fair improvement iwht technique..  Pt stated that bil shoulders are sore and tired after gait and propelling w/c.  Strengthening on NuStep at level 5 x 4 minutes with bil UEs and LLE; at level 7 using LLE only.  Amputee education (discussion) regarding desensitization, making sure amputee pad is elevated to prevent dependent position of residual limb with resulting edema, avoiding sitting EOB wth residual limb in dependent position, as well as prone positioning to avoid hip flexion contracture.  Pt is aware that smoking impedes helaing; she quite the day she entered hospital and is dedicated to smoking cessation now.  Pt asked to be weighed; sit> stand from w/c with mod assist to bar of scale.  Pt left resting in w/c with needs at hand and seat belt alarm set.  tx 2:  Pt sitting EOB, without limb protector on; she stated she  had just removed it. No pain reported. Pt has a blister- appearing area over patella R knee, covered by post-op bandage. Pt is very tender over this.  Pt noted to have small lesion opening on L lateral lower leg  Bed mobility to L/R side lying .  In side lying: R/L hip abduction x 12 each.  Pt has poor recruitment of gluteus medius.  In prone, 10 x 1 L hip extension with extended knee, 5 x 1 hip extension with flexed knee. Pt has a hiatal hernia and is uncomfortable in this position; better with forearms under her.  In supine: 2 x 10 cervical flexion with UEs across chest. 1 x 10 R/L scapular protraction with extended elbows, L unilateral bridging with bil UEs across chest.  PT instructed pt in diaphragmatic breathing, using quick sniffs to activate diaphragm, with good results.   Pt left resting in bed with needs at hand and alarm set.      Therapy Documentation Precautions:  Precautions Precautions: Fall Restrictions Weight Bearing Restrictions: Yes RLE Weight Bearing: Non weight bearing Other Position/Activity Restrictions: R limb guard w/ OOB activity    Therapy/Group: Individual Therapy  Amonie Wisser 07/13/2018, 9:58 AM

## 2018-07-13 NOTE — Progress Notes (Signed)
ANTICOAGULATION CONSULT NOTE - Initial Consult  Pharmacy Consult for Coumadin Indication: DVT  Allergies  Allergen Reactions  . Aleve [Naproxen Sodium] Hives and Itching  . Cortisone Swelling    Internal organs  . Prednisone Swelling    Makes internal organs swell  . Vancomycin Itching    Patient Measurements: Height: 5\' 10"  (177.8 cm) IBW/kg (Calculated) : 68.5 Heparin Dosing Weight:   Vital Signs: Temp: 97.9 F (36.6 C) (02/05 0409) Temp Source: Oral (02/05 0409) BP: 111/60 (02/05 0409) Pulse Rate: 84 (02/05 0409)  Labs: Recent Labs    07/12/18 0725  HGB 11.9*  HCT 36.8  PLT 221  LABPROT 13.7  INR 1.06  CREATININE 0.89    Estimated Creatinine Clearance: 93.6 mL/min (by C-G formula based on SCr of 0.89 mg/dL).   Medical History: Past Medical History:  Diagnosis Date  . COPD (chronic obstructive pulmonary disease) ([place])   . Depression   . DM (diabetes mellitus) ([place])   . DVT (deep venous thrombosis) ([facility])    x5  . Family history of colon cancer   . GERD (gastroesophageal reflux disease)   . Hyperlipemia   . Hypertension   . Hypothyroid   . Obesity   . PVD (peripheral vascular disease) ([place])   . Uterine cancer ([place])   . UTI (urinary tract infection)     Medications:  Medications Prior to Admission  Medication Sig Dispense Refill Last Dose  . albuterol (PROVENTIL HFA;VENTOLIN HFA) 108 (90 Base) MCG/ACT inhaler Inhale 2 puffs into the lungs every 6 (six) hours as needed for wheezing or shortness of breath. 1 Inhaler 11 07/03/2018 at Unknown time  . aspirin 325 MG tablet Take 325 mg by mouth daily.   07/04/2018 at Unknown time  . atorvastatin (LIPITOR) 40 MG tablet TAKE 1 TABLET DAILY (Patient taking differently: Take 40 mg by mouth daily. ) 90 tablet 0 07/04/2018 at Unknown time  . Cholecalciferol (VITAMIN D3) 5000 units TABS Take 1 tablet by mouth daily.   07/04/2018 at Unknown time  . clopidogrel (PLAVIX) 75 MG tablet TAKE 1 TABLET DAILY (Patient taking  differently: Take 75 mg by mouth once. ) 90 tablet 0 07/04/2018 at Unknown time  . furosemide (LASIX) 20 MG tablet TAKE 1 TABLET DAILY (Patient taking differently: Take 20 mg by mouth daily. ) 90 tablet 0 07/04/2018 at Unknown time  . insulin aspart (NOVOLOG) 100 UNIT/ML injection Inject 0-15 Units into the skin 3 (three) times daily with meals.     . insulin aspart (NOVOLOG) 100 UNIT/ML injection Inject 0-5 Units into the skin at bedtime.     . insulin glargine (LANTUS) 100 UNIT/ML injection Inject 0.2 mLs (20 Units total) into the skin at bedtime.     Marland Kitchen levothyroxine (SYNTHROID, LEVOTHROID) 100 MCG tablet TAKE (1) TABLET DAILY BE- FORE BREAKFAST. (Patient taking differently: Take 100 mcg by mouth daily. ) 90 tablet 0 07/04/2018 at Unknown time  . losartan (COZAAR) 50 MG tablet TAKE 1 TABLET DAILY (Patient taking differently: Take 50 mg by mouth daily. ) 90 tablet 0 07/04/2018 at Unknown time  . omeprazole (PRILOSEC) 20 MG capsule TAKE (1) CAPSULE DAILY (Patient taking differently: Take 20 mg by mouth daily. ) 90 capsule 0 07/04/2018 at Unknown time  . ONE TOUCH ULTRA TEST test strip TEST BLOOD SUGARS TWICE A DAY 50 each 2 Taking  . oxyCODONE (OXY IR/ROXICODONE) 5 MG immediate release tablet Take 1-2 tablets (5-10 mg total) by mouth every 4 (four) hours as needed for moderate  pain.     . polyethylene glycol (MIRALAX / GLYCOLAX) packet Take 17 g by mouth daily as needed for moderate constipation.     . senna (SENOKOT) 8.6 MG TABS tablet Take 1 tablet (8.6 mg total) by mouth 2 (two) times daily.     Marland Kitchen tretinoin (RETIN-A) 0.05 % cream APPLY TO AFFECTED AREAS AT BEDTIME (Patient taking differently: Apply 1 application topically at bedtime. To affected areas.) 20 g 0 07/03/2018 at Unknown time  . umeclidinium bromide (INCRUSE ELLIPTA) 62.5 MCG/INH AEPB Inhale 1 puff into the lungs daily. 30 each 3 07/03/2018 at Unknown time  . warfarin (COUMADIN) 5 MG tablet Take 1 tablet (5 mg total) by mouth daily at 6 PM for 1  dose.      Scheduled:  . aspirin EC  81 mg Oral Daily  . atorvastatin  40 mg Oral Daily  . cholecalciferol  5,000 Units Oral Daily  . clopidogrel  75 mg Oral Daily  . enoxaparin (LOVENOX) injection  40 mg Subcutaneous Q24H  . furosemide  20 mg Oral Daily  . insulin aspart  0-15 Units Subcutaneous TID WC  . insulin aspart  0-5 Units Subcutaneous QHS  . insulin glargine  20 Units Subcutaneous QHS  . levothyroxine  100 mcg Oral Daily  . losartan  50 mg Oral Daily  . nicotine  21 mg Transdermal Daily  . pantoprazole  40 mg Oral Daily  . senna  1 tablet Oral Daily  . umeclidinium bromide  1 puff Inhalation Daily  . Warfarin - Pharmacist Dosing Inpatient   Does not apply q1800   Infusions:    Assessment: Pt just got tx from 5W at [facility] after a BKA. She was on Coumadin while in [place], however, she rarely gets her INR checked there. Coumadin has been ordered to be resumed here. D/w with Dr. Posey Pronto, we will put her on DVT px lovenox until INR is therapeutic.   She refused INR this AM because she doesn't like to be stick. Spent a good 30 minutes talking to her about the need to get INR if she is to stay on coumadin. She agrees to get it starting tomorrow. She is not even a pt with Dr. Scot Dock anymore. I believe it's more feasible for her to be on apixaban 2.5mg  BID for PVD rather than DVT.  Goal of Therapy:  INR 2-3 Monitor platelets by anticoagulation protocol: Yes   Plan:   Coumadin 5mg  PO x1 Lovenox 40mg  SQ qday Daily INR  Onnie Boer, PharmD, BCIDP, AAHIVP, CPP Infectious Disease Pharmacist 07/13/2018 2:07 PM

## 2018-07-13 NOTE — Progress Notes (Signed)
Pt refused Pt/INR this morning. Dr. Posey Pronto has been notified

## 2018-07-13 NOTE — Progress Notes (Signed)
Notified on call provider about patient request for ground pass. Verbal order provide by on call provider. Order initiated and patient and family made aware. Will continue to monitor

## 2018-07-13 NOTE — Patient Care Conference (Signed)
Inpatient RehabilitationTeam Conference and Plan of Care Update Date: 07/13/2018   Time: 2:10 PM    Patient Name: [name]      Medical Record Number: [id number]  Date of Birth: 05-08-1961 Sex: Female         Room/Bed: 4M08C/4M08C-01 Payor Info: Payor: MEDICARE / Plan: MEDICARE PART A AND B / Product Type: *No Product type* /    Admitting Diagnosis: R bka  Admit Date/Time:  07/11/2018  7:36 PM Admission Comments: No comment available   Primary Diagnosis:  <principal problem not specified> Principal Problem: <principal problem not specified>  Patient Active Problem List   Diagnosis Date Noted  . Subtherapeutic international normalized ratio (INR)   . Noncompliance   . Leukocytosis   . Acute blood loss anemia   . Diabetes mellitus type 2 in obese ([place])   . Unilateral complete BKA, right, initial encounter ([place]) 07/11/2018  . Acquired absence of right leg below knee ([place]) 07/08/2018  . Mild protein-calorie malnutrition ([place])   . Osteomyelitis of great toe of right foot ([place]) 07/04/2018  . Intermittent claudication ([place]) 05/11/2017  . Deep vein thrombosis (DVT) of both lower extremities ([place]) 01/05/2017  . Peripheral vascular insufficiency ([place]) 09/25/2015  . Hypothyroidism 01/29/2014  . Obesity (BMI 30-39.9) 08/16/2013  . Impetigo 07/28/2013  . Chronic obstructive pulmonary disease ([place])   . Malignant neoplasm of uterus ([place])   . GERD (gastroesophageal reflux disease) 10/18/2012  . Hernia, hiatal 10/18/2012  . Diabetic infection of right foot ([place]) 04/18/2008  . Hyperlipidemia 04/18/2008  . SMOKER 04/18/2008  . Essential hypertension 04/18/2008    Expected Discharge Date: Expected Discharge Date: 07/17/18  Team Members Present: Physician leading conference: Dr. Delice Lesch Social Worker Present: Ovidio Kin, LCSW Nurse Present: Rayetta Pigg, RN PT Present: Georjean Mode, PT OT Present: Amy Rounds, OT SLP Present: Windell Moulding, SLP PPS Coordinator present  : Gunnar Fusi     Current Status/Progress Goal Weekly Team Focus  Medical   Functional and mobility deficits secondary to PAD, right BKA on 1/31  Improve mobility, safety, DM, wound, compliance with INR  See above   Bowel/Bladder   Continent of bladder, constipation, last BM 1/30  To remain continent and schedule stool softner/laxative  Eating more fiber, and drinking more fluids   Swallow/Nutrition/ Hydration             ADL's   min A with LB self care, CGA transfers  mod I overall except S for shower stall transfer and for simple meal prep  ADL training. DME training, balance, endurance, pt education   Mobility   CGA transfers, min assist gait x 10', S w/c x 150'  independent transfers, set up assist for car transfer, supervision gait x 25' wiht LRAD, independent w/c x 150'  pt ed, bed mobility, transfer, gait, w/c   Communication             Safety/Cognition/ Behavioral Observations            Pain   Constant pain to right leg (Phantom pain), patient refused oxy IR this morning stating that it is causing heartburn  Pain level of 3 or less  Educate the need for pain control, help in therapy effectiveness   Skin   Pt has surgical site (Rt BKA)  Free from infection  Assess skin every shift and PRN    Rehab Goals Patient on target to meet rehab goals: Yes *See Care Plan and progress notes for long and short-term goals.  Barriers to Discharge  Current Status/Progress Possible Resolutions Date Resolved   Physician    Medical stability;Wound Care;Weight bearing restrictions;Other (comments)  INR compliance  See above  Therapies, optimize DM meds, D/c VAC Friday, encourage compliance      Nursing                  PT  Decreased caregiver support  husband works during day               OT                  SLP                SW                Discharge Planning/Teaching Needs:  Home with husband who works days & evenings and daughter coming in to check on her. Goals  set mod/i wheelchair level. Short length of stay      Team Discussion:  Goals mod/i-supervision level. White count MD watching along with CBG's. Refused INR draw today ut has spoken with the pharmacist and is agreeable to INR tomorrow. Over confident with abilities needs to be safe. Wound vac DC Friday.   Revisions to Treatment Plan:  DC 2/9    Continued Need for Acute Rehabilitation Level of Care: The patient requires daily medical management by a physician with specialized training in physical medicine and rehabilitation for the following conditions: Daily direction of a multidisciplinary physical rehabilitation program to ensure safe treatment while eliciting the highest outcome that is of practical value to the patient.: Yes Daily medical management of patient stability for increased activity during participation in an intensive rehabilitation regime.: Yes Daily analysis of laboratory values and/or radiology reports with any subsequent need for medication adjustment of medical intervention for : Post surgical problems;Diabetes problems;Wound care problems   I attest that I was present, via teleconference lead the team conference, and concur with the assessment and plan of the team.   Elease Hashimoto 07/13/2018, 2:40 PM

## 2018-07-13 NOTE — Progress Notes (Signed)
Social Work Patient ID: [name], female   DOB: August 14, 1960, 58 y.o.   MRN: [id number] Met with pt and her friend to inform team conference goals mod/i-supervision level and discharge 2/9. She had hoped to leave Friday but is ok with Sunday. Will work on equipment needs and follow up needs. She will be glad to go home due to her experience in the hospital has not been the best.

## 2018-07-13 NOTE — Progress Notes (Signed)
Occupational Therapy Session Note  Patient Details  Name: [name] MRN: [id number] Date of Birth: 05-25-1961  Today's Date: 07/13/2018 OT Individual Time: 2482-5003 and 1430-1500 OT Individual Time Calculation (min): 75 min and 30 min   Short Term Goals: Week 1:  OT Short Term Goal 1 (Week 1): STGs = LTGs  Skilled Therapeutic Interventions/Progress Updates:    Session One: PT seen for OT ADL bathing/dressing session. Pt awake sitting EOB upon arrival with MD present completing AM [name]. Pt declining pain though did voice significant phantom limb feelings. Throughout session, she completed stand pivot transfers with RW or grab bar with supervision, min VCs for w/c parts management. MD gave clearance to shower with wound and wound vac covered. She bathed with distant supervision seated on padded tub bench. She returned to w/c to dress and complete grooming tasks. Grooming tasks completed from w/c level mod I. She dressed seated in w/c, standing with RW and supervision to pull pants/underwear up. Pt able to doff shrinker independently and required min A to don clean shrinker. Education provided throughout session regarding OT/PT goals, energy conservation, DME, continuum of care, and d/c planning.   Session Two: Pt seen for OT session addressing simple meal prep goal. Pt in supine upon arrival, voicing fatigue from previous tx session, but willing to participate in therapy as able. Throughout session, completed stand pivot transfers with RW and supervision, assist to steady equipment.  Completed kitchen mobility anda accessibility activity from w/c level, problem solving how to obtain items from refrigerator, overhead and low  cabinets, and removing/replacing items from sink and oven. Education provided throughout as well as cuing for safety awareness and locking of w/c brakes.  Pt returned to room at end of session, requesting to stay sitting up in w/c. Pt left with all needs in reach  and chair belt alarm on.  Discussed d/c planning and plan for d/c on Sunday, pt agreeable. Pt also open to having peer support come during rehab admission, therapist working on coordinating this.   Therapy Documentation Precautions:  Precautions Precautions: Fall Restrictions Weight Bearing Restrictions: Yes RLE Weight Bearing: Non weight bearing Other Position/Activity Restrictions: R limb guard w/ OOB activity   Therapy/Group: Individual Therapy  [name] L 07/13/2018, 7:03 AM

## 2018-07-13 NOTE — Progress Notes (Signed)
[facility] PHYSICAL MEDICINE & REHABILITATION PROGRESS NOTE  Subjective/Complaints: Patient seen sitting up in bed this morning.  She states she had a very good first day of therapies yesterday and she is ready to go home.  She has questions regarding drainage from scab of her back.  Notified by nursing patient refused INR stating that she only has it checked once every 5 years at home.  Discussed with pharmacy INR Coumadin, compliance, alternative treatments.  ROS: Denies CP, SOB, N/V/D  Objective: Vital Signs: Blood pressure 111/60, pulse 84, temperature 97.9 F (36.6 C), temperature source Oral, resp. rate 18, height 5\' 10"  (1.778 m), SpO2 99 %. No results found. Recent Labs    07/12/18 0725  WBC 13.1*  HGB 11.9*  HCT 36.8  PLT 221   Recent Labs    07/12/18 0725  NA 138  K 4.2  CL 101  CO2 27  GLUCOSE 257*  BUN 13  CREATININE 0.89  CALCIUM 8.7*    Physical Exam: BP 111/60 (BP Location: Left Arm)   Pulse 84   Temp 97.9 F (36.6 C) (Oral)   Resp 18   Ht 5\' 10"  (1.778 m)   SpO2 99%   BMI 33.15 kg/m  Constitutional: NAD.  Obese. HENT: Normocephalic.  Atraumatic. Eyes:EOMI.  No discharge. Cardiovascular:RRR.  No JVD. Respiratory:Effort normal.  Clear. GI:She exhibitsno distension.  Bowel sounds normal. Musculoskeletal: Compressive dressing with wound VAC on right BKA Neurological: Alert and oriented. Motor: Bilateral upper extremities 5/5 proximal to distal, unchanged.  Left lower extremity: 5/5 proximal to distal, unchanged RLE: Hip flexion: 4+/5 (pain inhibition)  Skin: Skin iswarmand dry. Multiple scars  Small area with minimal serous drainage Psychiatric: She has a normal mood and affect. Herbehavior is normal.  Assessment/Plan: 1. Functional deficits secondary to right BKA which require 3+ hours per day of interdisciplinary therapy in a comprehensive inpatient rehab setting.  Physiatrist is providing close team supervision and 24 hour  management of active medical problems listed below.  Physiatrist and rehab team continue to assess barriers to discharge/monitor patient progress toward functional and medical goals  Care Tool:  Bathing    Body parts bathed by patient: Right arm, Left arm, Chest, Abdomen, Front perineal area, Buttocks, Right upper leg, Left upper leg, Face, Left lower leg   Body parts bathed by helper: Left lower leg Body parts n/a: Right lower leg   Bathing assist Assist Level: Supervision/Verbal cueing     Upper Body Dressing/Undressing Upper body dressing   What is the patient wearing?: Pull over shirt    Upper body assist Assist Level: Set up assist    Lower Body Dressing/Undressing Lower body dressing      What is the patient wearing?: Pants     Lower body assist Assist for lower body dressing: Supervision/Verbal cueing     Toileting Toileting    Toileting assist Assist for toileting: Moderate Assistance - Patient 50 - 74%     Transfers Chair/bed transfer  Transfers assist     Chair/bed transfer assist level: Contact Guard/Touching assist     Locomotion Ambulation   Ambulation assist      Assist level: Contact Guard/Touching assist Assistive device: Walker-rolling Max distance: 10'   Walk 10 feet activity   Assist     Assist level: Contact Guard/Touching assist Assistive device: Walker-rolling   Walk 50 feet activity   Assist Walk 50 feet with 2 turns activity did not occur: Safety/medical concerns  Walk 150 feet activity   Assist Walk 150 feet activity did not occur: Safety/medical concerns         Walk 10 feet on uneven surface  activity   Assist Walk 10 feet on uneven surfaces activity did not occur: Safety/medical concerns         Wheelchair     Assist Will patient use wheelchair at discharge?: Yes Type of Wheelchair: Manual    Wheelchair assist level: Supervision/Verbal cueing Max wheelchair distance: 150'     Wheelchair 50 feet with 2 turns activity    Assist        Assist Level: Supervision/Verbal cueing   Wheelchair 150 feet activity     Assist     Assist Level: Supervision/Verbal cueing      Medical Problem List and Plan: 1.Functional and mobility deficitssecondary to PAD, right BKA on 1/31  Continue CIR 2.H/oBLE/DVT/Anticoagulation:Pharmaceutical:Coumadin  INR subtherapeutic on 2/4 will discuss compliance and consider alternative medications if necessary 3. Pain Management:Oxycodone prn is effective. 4. Mood:LCSW to follow for evaluation and support. 5. Neuropsych: This patientiscapable of making decisions onherown behalf. 6. Skin/Wound Care:Continue compressive dressing with prevena VAC on R-BKA. D/c VAC on 2/7 7. Fluids/Electrolytes/Nutrition:Monitor I/O   BMP within acceptable range on 2/4 8. KXF:GHWEXHB BP bid. Continue lasix and cozaar.  Controlled on 2/5  Monitor with increased mobility 9. COPD: Encourage tobacco cessation. Continue Incruse daily.  10. T2DM: Monitor Bs ac/hs. Continue Lantus at bedtime with meal coverage.Off metforminat present Elevated on 2/5, monitor for trend  Monitor with increased mobility 11. Leucocytosis: Resolving. Monitor for fevers and other sign so infection.  12.PVD: Continue ASA/Plavix and Lipitor. 13. Leukocytosis  WBCs 13.1 on 2/4  Afebrile  Cont to monitor 14. ABLA  Hb 11.9 on 2/4  Cont to monitor  LOS: 2 days A FACE TO FACE EVALUATION WAS PERFORMED  [name] Lorie Phenix 07/13/2018, 8:43 AM

## 2018-07-14 ENCOUNTER — Ambulatory Visit: Payer: Medicare Other | Admitting: Family Medicine

## 2018-07-14 ENCOUNTER — Inpatient Hospital Stay (HOSPITAL_COMMUNITY): Payer: Medicare Other | Admitting: Occupational Therapy

## 2018-07-14 ENCOUNTER — Inpatient Hospital Stay (HOSPITAL_COMMUNITY): Payer: Medicare Other

## 2018-07-14 DIAGNOSIS — Z89511 Acquired absence of right leg below knee: Secondary | ICD-10-CM

## 2018-07-14 LAB — GLUCOSE, CAPILLARY
GLUCOSE-CAPILLARY: 151 mg/dL — AB (ref 70–99)
Glucose-Capillary: 139 mg/dL — ABNORMAL HIGH (ref 70–99)
Glucose-Capillary: 181 mg/dL — ABNORMAL HIGH (ref 70–99)
Glucose-Capillary: 188 mg/dL — ABNORMAL HIGH (ref 70–99)

## 2018-07-14 LAB — PROTIME-INR
INR: 1.01
PROTHROMBIN TIME: 13.2 s (ref 11.4–15.2)

## 2018-07-14 MED ORDER — INSULIN GLARGINE 100 UNIT/ML ~~LOC~~ SOLN
10.0000 [IU] | Freq: Every day | SUBCUTANEOUS | Status: DC
  Administered 2018-07-14: 10 [IU] via SUBCUTANEOUS
  Filled 2018-07-14 (×2): qty 0.1

## 2018-07-14 MED ORDER — METFORMIN HCL 500 MG PO TABS
500.0000 mg | ORAL_TABLET | Freq: Two times a day (BID) | ORAL | Status: DC
  Administered 2018-07-14 – 2018-07-16 (×4): 500 mg via ORAL
  Filled 2018-07-14 (×4): qty 1

## 2018-07-14 MED ORDER — WARFARIN SODIUM 7.5 MG PO TABS
7.5000 mg | ORAL_TABLET | Freq: Once | ORAL | Status: AC
  Administered 2018-07-14: 7.5 mg via ORAL
  Filled 2018-07-14: qty 1

## 2018-07-14 NOTE — Progress Notes (Signed)
ANTICOAGULATION CONSULT NOTE - Initial Consult  Pharmacy Consult for Coumadin Indication: DVT  Allergies  Allergen Reactions  . Aleve [Naproxen Sodium] Hives and Itching  . Cortisone Swelling    Internal organs  . Prednisone Swelling    Makes internal organs swell  . Vancomycin Itching    Patient Measurements: Height: 5\' 10"  (177.8 cm) IBW/kg (Calculated) : 68.5 Heparin Dosing Weight:   Vital Signs: BP: 101/55 (02/06 0636) Pulse Rate: 69 (02/06 0636)  Labs: Recent Labs    07/12/18 0725 07/14/18 0541  HGB 11.9*  --   HCT 36.8  --   PLT 221  --   LABPROT 13.7 13.2  INR 1.06 1.01  CREATININE 0.89  --     Estimated Creatinine Clearance: 93.6 mL/min (by C-G formula based on SCr of 0.89 mg/dL).   Medical History: Past Medical History:  Diagnosis Date  . COPD (chronic obstructive pulmonary disease) ([place])   . Depression   . DM (diabetes mellitus) ([place])   . DVT (deep venous thrombosis) ([facility])    x5  . Family history of colon cancer   . GERD (gastroesophageal reflux disease)   . Hyperlipemia   . Hypertension   . Hypothyroid   . Obesity   . PVD (peripheral vascular disease) ([place])   . Uterine cancer ([place])   . UTI (urinary tract infection)     Medications:  Medications Prior to Admission  Medication Sig Dispense Refill Last Dose  . albuterol (PROVENTIL HFA;VENTOLIN HFA) 108 (90 Base) MCG/ACT inhaler Inhale 2 puffs into the lungs every 6 (six) hours as needed for wheezing or shortness of breath. 1 Inhaler 11 07/03/2018 at Unknown time  . aspirin 325 MG tablet Take 325 mg by mouth daily.   07/04/2018 at Unknown time  . atorvastatin (LIPITOR) 40 MG tablet TAKE 1 TABLET DAILY (Patient taking differently: Take 40 mg by mouth daily. ) 90 tablet 0 07/04/2018 at Unknown time  . Cholecalciferol (VITAMIN D3) 5000 units TABS Take 1 tablet by mouth daily.   07/04/2018 at Unknown time  . clopidogrel (PLAVIX) 75 MG tablet TAKE 1 TABLET DAILY (Patient taking differently: Take 75 mg  by mouth once. ) 90 tablet 0 07/04/2018 at Unknown time  . furosemide (LASIX) 20 MG tablet TAKE 1 TABLET DAILY (Patient taking differently: Take 20 mg by mouth daily. ) 90 tablet 0 07/04/2018 at Unknown time  . insulin aspart (NOVOLOG) 100 UNIT/ML injection Inject 0-15 Units into the skin 3 (three) times daily with meals.     . insulin aspart (NOVOLOG) 100 UNIT/ML injection Inject 0-5 Units into the skin at bedtime.     . insulin glargine (LANTUS) 100 UNIT/ML injection Inject 0.2 mLs (20 Units total) into the skin at bedtime.     Marland Kitchen levothyroxine (SYNTHROID, LEVOTHROID) 100 MCG tablet TAKE (1) TABLET DAILY BE- FORE BREAKFAST. (Patient taking differently: Take 100 mcg by mouth daily. ) 90 tablet 0 07/04/2018 at Unknown time  . losartan (COZAAR) 50 MG tablet TAKE 1 TABLET DAILY (Patient taking differently: Take 50 mg by mouth daily. ) 90 tablet 0 07/04/2018 at Unknown time  . omeprazole (PRILOSEC) 20 MG capsule TAKE (1) CAPSULE DAILY (Patient taking differently: Take 20 mg by mouth daily. ) 90 capsule 0 07/04/2018 at Unknown time  . ONE TOUCH ULTRA TEST test strip TEST BLOOD SUGARS TWICE A DAY 50 each 2 Taking  . oxyCODONE (OXY IR/ROXICODONE) 5 MG immediate release tablet Take 1-2 tablets (5-10 mg total) by mouth every 4 (four) hours  as needed for moderate pain.     . polyethylene glycol (MIRALAX / GLYCOLAX) packet Take 17 g by mouth daily as needed for moderate constipation.     . senna (SENOKOT) 8.6 MG TABS tablet Take 1 tablet (8.6 mg total) by mouth 2 (two) times daily.     Marland Kitchen tretinoin (RETIN-A) 0.05 % cream APPLY TO AFFECTED AREAS AT BEDTIME (Patient taking differently: Apply 1 application topically at bedtime. To affected areas.) 20 g 0 07/03/2018 at Unknown time  . umeclidinium bromide (INCRUSE ELLIPTA) 62.5 MCG/INH AEPB Inhale 1 puff into the lungs daily. 30 each 3 07/03/2018 at Unknown time  . warfarin (COUMADIN) 5 MG tablet Take 1 tablet (5 mg total) by mouth daily at 6 PM for 1 dose.      Scheduled:   . aspirin EC  81 mg Oral Daily  . atorvastatin  40 mg Oral Daily  . cholecalciferol  5,000 Units Oral Daily  . clopidogrel  75 mg Oral Daily  . enoxaparin (LOVENOX) injection  40 mg Subcutaneous Q24H  . furosemide  20 mg Oral Daily  . insulin aspart  0-15 Units Subcutaneous TID WC  . insulin aspart  0-5 Units Subcutaneous QHS  . insulin glargine  20 Units Subcutaneous QHS  . levothyroxine  100 mcg Oral Daily  . losartan  50 mg Oral Daily  . nicotine  21 mg Transdermal Daily  . pantoprazole  40 mg Oral Daily  . senna  1 tablet Oral Daily  . Warfarin - Pharmacist Dosing Inpatient   Does not apply q1800   Infusions:    Assessment: Pt just got tx from 5W at [facility] after a BKA. She was on Coumadin while in [place], however, she rarely gets her INR checked there. Coumadin has been ordered to be resumed here. D/w with Dr. Posey Pronto, we will put her on DVT px lovenox until INR is therapeutic.   She refused INR yesterday because she doesn't like to be stick. Spent a good 30 minutes talking to her about the need to get INR if she is to stay on coumadin. She agrees to get it starting tomorrow. She is not even a pt with Dr. Scot Dock anymore. I believe it's more feasible for her to be on apixaban 2.5mg  BID for PVD rather than DVT.  Dr. Posey Pronto will discuss the potential use of apixaban with her today and eval the cost. Her INR came back at 1 today so we will increase the dose for now.   Goal of Therapy:  INR 2-3 Monitor platelets by anticoagulation protocol: Yes   Plan:   Coumadin 7.5mg  PO x1 Lovenox 40mg  SQ qday Daily INR F/u with apixaban  Onnie Boer, PharmD, BCIDP, AAHIVP, CPP Infectious Disease Pharmacist 07/14/2018 8:55 AM

## 2018-07-14 NOTE — Progress Notes (Signed)
Physical Therapy Session Note  Patient Details  Name: [name] MRN: [id number] Date of Birth: Sep 30, 1960  Today's Date: 07/14/2018 PT Individual Time: 0830-0945 PT Individual Time Calculation (min): 75 min   Short Term Goals: Week 1:  PT Short Term Goal 1 (Week 1): =LTGs due to ELOS  Skilled Therapeutic Interventions/Progress Updates:  Pt seated in w/c.  She stated that she slept 5 hours last night.  She c/o L pectoral area being tender , but has no pain at rest.  Sit> stand with RW, CGA.  Stand pivot with RW to mat with CGA.; rolling L><R supervision.  Supine> sit with supervision.  Pt doffed and donned limb protector.  W/c propulsion using bil UEs x 100' with supervision on level tile.  Strengthening from w/c level, using Kinetron at level 40 cm/sec with LLE x 25 cycles x 2 targeting gluteal muscles, x 25 cycles x 2 targeting heel cords.  Seated edge of mat: 2 x 10 R short arc quad knee extension and bil scapular adduction using 4# wt, 1 x 10 L long arc quad knee extension with ankle pumps at end range. In supine: 1 x 10 R/L shoulder protraction,  bil bridging with bolster under R thigh, cervical ; in R/L side lying- 15 x 1 each  L/R hip abduction with flexed knees and hips, pillow between thighs.  Gait training with RW on level tile x 15' with min guard assist.  Pt left resting in w/c with needs at hand, limb protector on and residual limb elevated.    Therapy Documentation Precautions:  Precautions Precautions: Fall Restrictions Weight Bearing Restrictions: No RLE Weight Bearing: Non weight bearing Other Position/Activity Restrictions: R limb guard w/ OOB activity  Pain: pt denies         Therapy/Group: Individual Therapy  Jerusha Reising 07/14/2018, 5:44 PM

## 2018-07-14 NOTE — Progress Notes (Addendum)
[facility] PHYSICAL MEDICINE & REHABILITATION PROGRESS NOTE  Subjective/Complaints: Patient seen sitting up in a chair working with therapy this morning.  She states she slept well overnight.  Discussed Coumadin versus Eliquis.  She complains of bilateral shoulder pain which she treats with home remedies.  ROS: Denies CP, SOB, N/V/D  Objective: Vital Signs: Blood pressure (!) 101/55, pulse 69, temperature 98.2 F (36.8 C), temperature source Oral, resp. rate 14, height 5\' 10"  (1.778 m), SpO2 96 %. No results found. Recent Labs    07/12/18 0725  WBC 13.1*  HGB 11.9*  HCT 36.8  PLT 221   Recent Labs    07/12/18 0725  NA 138  K 4.2  CL 101  CO2 27  GLUCOSE 257*  BUN 13  CREATININE 0.89  CALCIUM 8.7*    Physical Exam: BP (!) 101/55 (BP Location: Left Arm)   Pulse 69   Temp 98.2 F (36.8 C) (Oral)   Resp 14   Ht 5\' 10"  (1.778 m)   SpO2 96%   BMI 33.15 kg/m  Constitutional: NAD.  Obese. HENT: Normocephalic.  Atraumatic. Eyes:EOMI.  No discharge. Cardiovascular:RRR.  No JVD. Respiratory:Effort normal.  Clear. GI:She exhibitsno distension.  Bowel sounds normal. Musculoskeletal: Compressive dressing with wound VAC on right BKA Bilateral shoulder pain Neurological: Alert and oriented. Motor: Bilateral upper extremities 5/5 proximal to distal, unchanged.  Left lower extremity: 5/5 proximal to distal, unchanged RLE: Hip flexion: 4+/5 (pain inhibition)  Skin:  Multiple scars Mild erythema along superior patella Psychiatric: She has a normal mood and affect. Herbehavior is normal.  Assessment/Plan: 1. Functional deficits secondary to right BKA which require 3+ hours per day of interdisciplinary therapy in a comprehensive inpatient rehab setting.  Physiatrist is providing close team supervision and 24 hour management of active medical problems listed below.  Physiatrist and rehab team continue to assess barriers to discharge/monitor patient progress toward  functional and medical goals  Care Tool:  Bathing    Body parts bathed by patient: Right arm, Left arm, Chest, Abdomen, Front perineal area, Buttocks, Right upper leg, Left upper leg, Face, Left lower leg   Body parts bathed by helper: Left lower leg Body parts n/a: Right lower leg   Bathing assist Assist Level: Supervision/Verbal cueing     Upper Body Dressing/Undressing Upper body dressing   What is the patient wearing?: Pull over shirt    Upper body assist Assist Level: Set up assist    Lower Body Dressing/Undressing Lower body dressing      What is the patient wearing?: Pants     Lower body assist Assist for lower body dressing: Supervision/Verbal cueing     Toileting Toileting    Toileting assist Assist for toileting: Moderate Assistance - Patient 50 - 74%     Transfers Chair/bed transfer  Transfers assist     Chair/bed transfer assist level: Contact Guard/Touching assist     Locomotion Ambulation   Ambulation assist      Assist level: Contact Guard/Touching assist Assistive device: Walker-rolling Max distance: 10'   Walk 10 feet activity   Assist     Assist level: Contact Guard/Touching assist Assistive device: Walker-rolling   Walk 50 feet activity   Assist Walk 50 feet with 2 turns activity did not occur: Safety/medical concerns         Walk 150 feet activity   Assist Walk 150 feet activity did not occur: Safety/medical concerns         Walk 10 feet on uneven surface  activity   Assist Walk 10 feet on uneven surfaces activity did not occur: Safety/medical concerns         Wheelchair     Assist Will patient use wheelchair at discharge?: Yes Type of Wheelchair: Manual    Wheelchair assist level: Supervision/Verbal cueing Max wheelchair distance: 100    Wheelchair 50 feet with 2 turns activity    Assist        Assist Level: Supervision/Verbal cueing   Wheelchair 150 feet activity     Assist      Assist Level: Supervision/Verbal cueing      Medical Problem List and Plan: 1.Functional and mobility deficitssecondary to PAD, right BKA on 1/31  Continue CIR 2.H/oBLE/DVT/Anticoagulation:Pharmaceutical:Coumadin  INR subtherapeutic on 2/6 will discuss compliance and consider alternative medications if necessary  Have discussed with pharmacy 3. Pain Management:Oxycodone prn is effective. 4. Mood:LCSW to follow for evaluation and support. 5. Neuropsych: This patientiscapable of making decisions onherown behalf. 6. Skin/Wound Care: D/c VAC on 2/7 7. Fluids/Electrolytes/Nutrition:Monitor I/O   BMP within acceptable range on 2/4 8. OIT:GPQDIYM BP bid. Continue lasix and cozaar.  Controlled on 2/6  Monitor with increased mobility 9. COPD: Encourage tobacco cessation. Continue Incruse daily.  10. T2DM: Monitor Bs ac/hs.   Lantus decreased to 10 on 2/6  Metformin 500 twice daily started on 2/6 Labile on 2/6  Monitor with increased mobility 11.PVD: Continue ASA/Plavix and Lipitor. 12. Leukocytosis  WBCs 13.1 on 2/4  Labs ordered for tomorrow  Afebrile  Cont to monitor 13. ABLA  Hb 11.9 on 2/4  Labs ordered for tomorrow  Cont to monitor  LOS: 3 days A FACE TO FACE EVALUATION WAS PERFORMED   Lorie Phenix 07/14/2018, 11:24 AM

## 2018-07-14 NOTE — Progress Notes (Signed)
Occupational Therapy Session Note  Patient Details  Name: [name] MRN: [id number] Date of Birth: 03/10/1961  Today's Date: 07/14/2018 OT Individual Time: [phone] and 1115-1210 and 1340-1400 and 20 min OT Individual Time Calculation (min): 55 min and 55 min and 20 min   Short Term Goals: Week 1:  OT Short Term Goal 1 (Week 1): STGs = LTGs  Skilled Therapeutic Interventions/Progress Updates:    Session One: Pt seen for OT session focusing on w/c mobility during IADL task and functional transfers. Pt asleep in supine, easily awoken and agreeable to tx session. Complaints of indigestion, however, reports having received medication earlier and agreeable to tx session. She transferred to EOB with VCs for technique/hand placement. Pt requesting therapist to raise height of bed prior to stand, education provided regarding importance of LE strengthening during sit>stand and "nose over knees" technique to come into standing as well as hand placement on RW. Able to stand with min A to RW and completed stand pivot transfer to w/c with CGA.  She self propelled w/c throughout unit with supervision for UE strengthening/endurance. Completed simulated meal prep activity, re-heating items from breakfast tray in microwave and transporting items to kitchen table. VCs for effective and safe problem solving methods as well as w/c parts management. While pt ate breakfast, extensive education and discussion regarding continuum of care, incorporating strethening and exercises into daily activities, importance of participation and independence with ADLs, residual limb care, return into community, and d/c planning.  Pt returned to room. Completed grooming tasks from standing position at sink with supervision, tolerating ~2 minutes in standing before requiring seated rest break. Complaints of shoulder discomfort when transitioning from sit>stand, education provided regarding modified hand palcement during  transfer. Pt left seated in w/c at end of session with RN present administering AM meds.   Session Two: Pt seen for OT ADL bathing/dressing and family education session. Pt in supine upon arrival with daughter present. Pt requesting to shower and for therapist to teach donning of shrinker. Pt able to correctly verbalize and demonstrate to daughter how to don/doff limb guard and shrinker, wear schedule and care for each. Pt able to don shrinker with min A with daughter assisting. Pt completed stand pivot transfers throughout session with supervision-CGA with VCs for technique and hand placement. She bathed seated on tub bench in shower with supervision, lateral leans for buttock hygiene.  She returned to w/c to dress, dressed sit>stand with supervision to pull pants up, Indep UB dressing. Pt left seated in w/c at end of session, all needs in reach, set-up with lunch tray.   Session Three: Pt sitting up in w/c upon arrival, playing cards with daughter and agreeable to tx session, denying pain.  Made pt aware that therapist spoke with [name] responsible for shrinker and limb guard, new size ordered as current shrinker now too large. New shrinker to be delivered today.  Discussed home bathroom set-up, pt will need to ambulate from doorway into bathroom at d/c. Completed simulated trial in ADL apartment, pt ambulating ~24ft with RW and close supervision, VCs for RW management. Completed sit>stand from standard toilet with supervision using grab bars as she has at home. Pt returned to w/c in same manner as described above. Discussed use of BSC PRN at d/c for energy conservation to elinate need for long distance walking as pt fatigued following above activity. Recommendation with pt agreement to use BSC at bed side at night.  Pt returned to room, left seated in  w/c awaiting nursing staff to assist with changing bed linnens. All needs in reach and daughter present.   Therapy Documentation Precautions:   Precautions Precautions: Fall Restrictions Weight Bearing Restrictions: Yes RLE Weight Bearing: Non weight bearing Other Position/Activity Restrictions: R limb guard w/ OOB activity   Therapy/Group: Individual Therapy  [name] L 07/14/2018, 6:49 AM

## 2018-07-14 NOTE — Plan of Care (Signed)
  Problem: Consults Goal: RH LIMB LOSS PATIENT EDUCATION Description Description: See Patient Education module for eduction specifics. Outcome: Progressing Goal: Diabetes Guidelines if Diabetic/Glucose > 140 Description If diabetic or lab glucose is > 140 mg/dl - Initiate Diabetes/Hyperglycemia Guidelines & Document Interventions  Outcome: Progressing Goal: Skin Care Protocol Initiated - if Braden Score 18 or less Description If consults are not indicated, leave blank or document N/A Outcome: Progressing   Problem: RH BOWEL ELIMINATION Goal: RH STG MANAGE BOWEL WITH ASSISTANCE Description STG Manage Bowel with Mod I Assistance.  Outcome: Progressing Goal: RH STG MANAGE BOWEL W/MEDICATION W/ASSISTANCE Description STG Manage Bowel with Medication with Mod I Assistance.  Outcome: Progressing   Problem: RH SKIN INTEGRITY Goal: RH STG SKIN FREE OF INFECTION/BREAKDOWN Description Mod I  Outcome: Progressing Goal: RH STG ABLE TO PERFORM INCISION/WOUND CARE W/ASSISTANCE Description STG Able To Perform Incision/Wound Care With Mod I Assistance.  Outcome: Progressing   Problem: RH SAFETY Goal: RH STG ADHERE TO SAFETY PRECAUTIONS W/ASSISTANCE/DEVICE Description STG Adhere to Safety Precautions With Supervision Assistance/Device.  Outcome: Progressing Goal: RH STG DECREASED RISK OF FALL WITH ASSISTANCE Description STG Decreased Risk of Fall With [employer].  Outcome: Progressing   Problem: RH PAIN MANAGEMENT Goal: RH STG PAIN MANAGED AT OR BELOW PT'S PAIN GOAL Description <2/10  Outcome: Progressing   Problem: RH KNOWLEDGE DEFICIT LIMB LOSS Goal: RH STG INCREASE KNOWLEDGE OF SELF CARE AFTER LIMB LOSS Description Patient will be able to verbalize self care of residual limb, pain management Mod I with cues/handouts  Outcome: Progressing

## 2018-07-15 ENCOUNTER — Inpatient Hospital Stay (HOSPITAL_COMMUNITY): Payer: Medicare Other

## 2018-07-15 DIAGNOSIS — S88111D Complete traumatic amputation at level between knee and ankle, right lower leg, subsequent encounter: Secondary | ICD-10-CM

## 2018-07-15 LAB — CBC WITH DIFFERENTIAL/PLATELET
Abs Immature Granulocytes: 0.06 10*3/uL (ref 0.00–0.07)
Basophils Absolute: 0.1 10*3/uL (ref 0.0–0.1)
Basophils Relative: 1 %
EOS ABS: 0.5 10*3/uL (ref 0.0–0.5)
Eosinophils Relative: 4 %
HCT: 39.4 % (ref 36.0–46.0)
Hemoglobin: 12.8 g/dL (ref 12.0–15.0)
Immature Granulocytes: 1 %
LYMPHS ABS: 3.6 10*3/uL (ref 0.7–4.0)
Lymphocytes Relative: 29 %
MCH: 28.9 pg (ref 26.0–34.0)
MCHC: 32.5 g/dL (ref 30.0–36.0)
MCV: 88.9 fL (ref 80.0–100.0)
Monocytes Absolute: 0.7 10*3/uL (ref 0.1–1.0)
Monocytes Relative: 6 %
Neutro Abs: 7.4 10*3/uL (ref 1.7–7.7)
Neutrophils Relative %: 59 %
Platelets: 281 10*3/uL (ref 150–400)
RBC: 4.43 MIL/uL (ref 3.87–5.11)
RDW: 13.6 % (ref 11.5–15.5)
WBC: 12.4 10*3/uL — ABNORMAL HIGH (ref 4.0–10.5)
nRBC: 0 % (ref 0.0–0.2)

## 2018-07-15 LAB — GLUCOSE, CAPILLARY
Glucose-Capillary: 176 mg/dL — ABNORMAL HIGH (ref 70–99)
Glucose-Capillary: 103 mg/dL — ABNORMAL HIGH (ref 70–99)
Glucose-Capillary: 194 mg/dL — ABNORMAL HIGH (ref 70–99)
Glucose-Capillary: 177 mg/dL — ABNORMAL HIGH (ref 70–99)

## 2018-07-15 LAB — PROTIME-INR
INR: 1.06
Prothrombin Time: 13.7 seconds (ref 11.4–15.2)

## 2018-07-15 MED ORDER — SENNA 8.6 MG PO TABS: 1.0000 | ORAL_TABLET | Freq: Every day | ORAL | 0 refills | Status: DC

## 2018-07-15 MED ORDER — ASPIRIN 81 MG PO TBEC: 81.0000 mg | DELAYED_RELEASE_TABLET | Freq: Every day | ORAL | Status: AC

## 2018-07-15 MED ORDER — ACETAMINOPHEN 325 MG PO TABS: 325.0000 mg | ORAL_TABLET | ORAL | Status: DC | PRN

## 2018-07-15 MED ORDER — METHOCARBAMOL 500 MG PO TABS: 500.0000 mg | ORAL_TABLET | Freq: Four times a day (QID) | ORAL | 0 refills | Status: DC | PRN

## 2018-07-15 MED ORDER — WARFARIN SODIUM 5 MG PO TABS
10.0000 mg | ORAL_TABLET | Freq: Once | ORAL | Status: AC
  Administered 2018-07-15: 10 mg via ORAL
  Filled 2018-07-15: qty 2

## 2018-07-15 MED ORDER — WARFARIN SODIUM 5 MG PO TABS: 7.5000 mg | ORAL_TABLET | Freq: Every day | ORAL | 0 refills | Status: DC

## 2018-07-15 MED ORDER — ENOXAPARIN SODIUM 40 MG/0.4ML ~~LOC~~ SOLN: 40.0000 mg | SUBCUTANEOUS | 0 refills | Status: DC

## 2018-07-15 MED ORDER — METFORMIN HCL 500 MG PO TABS: 500.0000 mg | ORAL_TABLET | Freq: Two times a day (BID) | ORAL | Status: DC

## 2018-07-15 MED ORDER — LOSARTAN POTASSIUM 50 MG PO TABS: 50.0000 mg | ORAL_TABLET | Freq: Every day | ORAL | 0 refills | Status: DC

## 2018-07-15 NOTE — Discharge Instructions (Signed)
Inpatient Rehab Discharge Instructions  Maryem Shuffler [place] Discharge date and time:  07/16/18  Activities/Precautions/ Functional Status: Activity: no lifting, driving, or strenuous exercise till cleared by MD Diet: diabetic diet Wound Care: Wash incision with soap and water. Pat dry and apply stump shrinker.   Functional status:  ___ No restrictions     ___ Walk up steps independently __X_ 24/7 supervision/assistance   ___ Walk up steps with assistance ___ Intermittent supervision/assistance  ___ Bathe/dress independently ___ Walk with walker     _X__ Bathe/dress with assistance ___ Walk Independently    ___ Shower independently ___ Walk with assistance    ___ Shower with assistance _X__ No alcohol/Tobaco    ___ Return to work/school ________   Special Instructions: 1. Needs assistance with transfers.  2. Monitor blood sugars before meals and at bedtime.   COMMUNITY REFERRALS UPON DISCHARGE:    Home Health:   PT & RN    Agency:[facility]   [place]   Date of last service:07/17/2018  Medical Equipment/Items Ordered:3 IN 1 & RIGHT AMPUTEE PAD FOR [place]   [phone]   GENERAL COMMUNITY RESOURCES FOR PATIENT/FAMILY: Support Groups:[facility]PPORT [facility] THE SECOND Thursday OF THE MONTH @ 7:00-8:30 PM ON THE [facility] ROOM 1H105 QUESTIONS CONTACT ROBIN [phone]  My questions have been answered and I understand these instructions. I will adhere to these goals and the provided educational materials after my discharge from the hospital.  Patient/Caregiver Signature _______________________________ Date __________  Clinician Signature _______________________________________ Date __________  Please bring this form and your medication list with you to all your follow-up doctor's appointments.

## 2018-07-15 NOTE — Progress Notes (Signed)
Wound vac removed per order. Patient tolerated well. One small area oozing scant blood in middle of incision site. Wraped in telfa, kerlex and ace wrap per order.

## 2018-07-15 NOTE — Progress Notes (Signed)
[facility] PHYSICAL MEDICINE & REHABILITATION PROGRESS NOTE  Subjective/Complaints: Patient seen sitting up at the edge of her bed this morning.  She states she slept well overnight.  She did not sleep well per sleep chart.  She has questions regarding anticoagulation and is happy that her wound VAC will be removed today.  ROS: Denies CP, SOB, N/V/D  Objective: Vital Signs: Blood pressure 126/75, pulse 78, temperature 98.1 F (36.7 C), temperature source Oral, resp. rate 12, height 5\' 10"  (1.778 m), weight 104.7 kg, SpO2 92 %. No results found. Recent Labs    07/15/18 0713  WBC 12.4*  HGB 12.8  HCT 39.4  PLT 281   No results for input(s): NA, K, CL, CO2, GLUCOSE, BUN, CREATININE, CALCIUM in the last 72 hours.  Physical Exam: BP 126/75 (BP Location: Left Arm)   Pulse 78   Temp 98.1 F (36.7 C) (Oral)   Resp 12   Ht 5\' 10"  (1.778 m)   Wt 104.7 kg   SpO2 92%   BMI 33.12 kg/m  Constitutional: NAD.  Obese. HENT: Normocephalic.  Atraumatic. Eyes:EOMI.  No discharge. Cardiovascular:RRR. No JVD. Respiratory:Effort normal.  Clear. GI:She exhibitsno distension.  Bowel sounds normal. Musculoskeletal: Compressive dressing with wound VAC on right BKA Bilateral shoulder pain, stable Neurological: Alert and oriented. Motor: Bilateral upper extremities 5/5 proximal to distal, stable.  Left lower extremity: 5/5 proximal to distal, stable RLE: Hip flexion: 5/5 Skin:  Multiple scars See above Psychiatric: She has a normal mood and affect. Herbehavior is normal.  Assessment/Plan: 1. Functional deficits secondary to right BKA which require 3+ hours per day of interdisciplinary therapy in a comprehensive inpatient rehab setting.  Physiatrist is providing close team supervision and 24 hour management of active medical problems listed below.  Physiatrist and rehab team continue to assess barriers to discharge/monitor patient progress toward functional and medical goals  Care  Tool:  Bathing    Body parts bathed by patient: Right arm, Left arm, Chest, Abdomen, Front perineal area, Buttocks, Right upper leg, Left upper leg, Face, Left lower leg   Body parts bathed by helper: Left lower leg Body parts n/a: Right lower leg   Bathing assist Assist Level: Supervision/Verbal cueing     Upper Body Dressing/Undressing Upper body dressing   What is the patient wearing?: Pull over shirt    Upper body assist Assist Level: Independent    Lower Body Dressing/Undressing Lower body dressing      What is the patient wearing?: Pants     Lower body assist Assist for lower body dressing: Supervision/Verbal cueing     Toileting Toileting    Toileting assist Assist for toileting: Supervision/Verbal cueing     Transfers Chair/bed transfer  Transfers assist     Chair/bed transfer assist level: Contact Guard/Touching assist     Locomotion Ambulation   Ambulation assist      Assist level: Contact Guard/Touching assist Assistive device: Walker-rolling Max distance: 10'   Walk 10 feet activity   Assist     Assist level: Contact Guard/Touching assist Assistive device: Walker-rolling   Walk 50 feet activity   Assist Walk 50 feet with 2 turns activity did not occur: Safety/medical concerns         Walk 150 feet activity   Assist Walk 150 feet activity did not occur: Safety/medical concerns         Walk 10 feet on uneven surface  activity   Assist Walk 10 feet on uneven surfaces activity did not occur:  Safety/medical concerns         Wheelchair     Assist Will patient use wheelchair at discharge?: Yes Type of Wheelchair: Manual    Wheelchair assist level: Supervision/Verbal cueing Max wheelchair distance: 100    Wheelchair 50 feet with 2 turns activity    Assist        Assist Level: Supervision/Verbal cueing   Wheelchair 150 feet activity     Assist     Assist Level: Supervision/Verbal cueing       Medical Problem List and Plan: 1.Functional and mobility deficitssecondary to PAD, right BKA on 1/31  Continue CIR 2.H/oBLE/DVT/Anticoagulation:Pharmaceutical:Coumadin  INR subtherapeutic on 2/6, discussed compliance with patient.  She appears to be more willing to be compliant.  We will plan to discharge on Coumadin and have PCP follow-up regarding continuation of Coumadin versus NOAC-have reached out to PCP to discuss.  Discussed with pharmacy 3. Pain Management:Oxycodone prn is effective. 4. Mood:LCSW to follow for evaluation and support. 5. Neuropsych: This patientiscapable of making decisions onherown behalf. 6. Skin/Wound Care: D/c VAC today 7. Fluids/Electrolytes/Nutrition:Monitor I/O   BMP within acceptable range on 2/4 8. QPR:FFMBWGY BP bid. Continue lasix and cozaar.  Controlled on 2/7  Monitor with increased mobility 9. COPD: Encourage tobacco cessation. Continue Incruse daily.  10. T2DM: Monitor Bs ac/hs.   Lantus decreased to 10 on 2/6  Metformin 500 twice daily started on 2/6 Elevated on 2/7, will not make any further changes today  Monitor with increased mobility 11.PVD: Continue ASA/Plavix and Lipitor. 12. Leukocytosis  WBCs 12.4 on 2/7  Afebrile  Cont to monitor 13. ABLA: Resolved  Hb 12.8 on 2/7  Cont to monitor  LOS: 4 days A FACE TO FACE EVALUATION WAS PERFORMED   Lorie Phenix 07/15/2018, 8:55 AM

## 2018-07-15 NOTE — Progress Notes (Signed)
Discharge instructions reviewed. She reports that she dose not want ANY pain medications. Also was not on lantus--A1c- has been around 8 on metformin bid. She gets it checked every 3 months. Advised her to check CBGs ac/hs and take record with her to appt on Wednesday.

## 2018-07-15 NOTE — Progress Notes (Signed)
Social Work Patient ID: [name], female   DOB: September 16, 1960, 58 y.o.   MRN: [id number] Team feels pt can discharge Sat after therapies instead of Sun. Pt is agreeable and MD ok with this. Will make sure equipment is here and follow up with [facility] is aware.

## 2018-07-15 NOTE — Progress Notes (Addendum)
Physical Therapy Session Note  Patient Details  Name: [name] MRN: [id number] Date of Birth: 09-29-1960  Today's Date: 07/15/2018 PT Individual Time: 1115-1200 PT Individual Time Calculation (min): 45 min   Short Term Goals: Week 1:  PT Short Term Goal 1 (Week 1): =LTGs due to ELOS  Skilled Therapeutic Interventions/Progress Updates:    Patient in room seated on BSC with NT in room.  Able to dress with S and cues pants to thighs, sit to stand minguard with RW in front and minguard for balance to don pants. Transfer to w/c minguard A and propelled to gym in w/c with S x 150'.  Demonstrated difference between hopping and using walker to take steps.  Gait x 15' with CGA and pt hopping, but noted arms fatigued from multiple transfers, wheeling chair, etc with OT this am.  Sit to supine S after removed her leg protector.  Performed therex as noted below.  Patient supine to sit increased time due to R arm pain with S.  Replaced limb protector and gait x 7' with RW and min A.  Prepared chair and propelled to room S and left in w/c with call bell in reach.  No alarm belt per pt okay after 3 days to remove.  Therapy Documentation Precautions:  Precautions Precautions: Fall Restrictions Weight Bearing Restrictions: No RLE Weight Bearing: Non weight bearing Other Position/Activity Restrictions: R limb guard w/ OOB activity Pain: Pain Assessment Pain Scale: 0-10 Pain Score: 0-No pain Exercises: Amputee Exercises Quad Sets: Strengthening;Right;10 reps;Supine Towel Squeeze: Strengthening;10 reps;Both;Supine Hip Extension: Strengthening;Right;10 reps;Supine(signle leg bridge over pillow) Hip ABduction/ADduction: Strengthening;Right;10 reps;Supine Knee Flexion: AAROM;10 reps;Right;Supine(passive overpressure ) Straight Leg Raises: Strengthening;Right;10 reps;Supine   Therapy/Group: Individual Therapy  Reginia Naas  [place], [place] 07/15/2018  07/15/2018, 11:53 AM

## 2018-07-15 NOTE — Progress Notes (Signed)
Patient states that she has given herself lovenox on prior occassions, no need for additional teaching.

## 2018-07-15 NOTE — Progress Notes (Signed)
Discussed patient with Sharyn Lull, NP at Dr. Tawanna Sat office regarding concerns about patient's compliance in the past, subtherapeutic INR as well as pharmacy recommendations of low dose apixaban as the safest alternative. She recommended d/c on coumadin and Lovenox. Patient has follow up next Wednesday with Dr. Laurance Flatten her PCP--he is out of the office today but they will evaluate her and decide on making any changes.

## 2018-07-15 NOTE — Progress Notes (Signed)
Occupational Therapy Session Note  Patient Details  Name: [name] MRN: [id number] Date of Birth: Nov 05, 1960  Today's Date: 07/15/2018 OT Individual Time: 4106-7761 and 250 076 1897 OT Individual Time Calculation (min): 72 min  And 73 min   Short Term Goals: Week 1:  OT Short Term Goal 1 (Week 1): STGs = LTGs  Skilled Therapeutic Interventions/Progress Updates:    1:1. Pt reporting pain in residual limb as phlebotomist bumped while drawing blood. RN alerted and delivered pain medication and OT made sign to hang above bed for staff to not bump residual limb. Extensive education on desensitization techqniques, limb guard and flex/ext exercises provided. Pt anxious about wound vac change. Education provided by OT and RN about process to ease anxiety. Recommended listen to preferred music for management of pain during process. Pt educated on quat pivot transfer and completes blocked practice EOB>w/c<>max 2x with min A fading to supervision and VC for w/c parts management. Pt grooms at sink in seated with set up. Exited session with pt seated in w/c with call light in reach and all need smet  Session 2: pt received with RN in room prepping ot remove wound vac. Heavy focus on VC for head hips relationship throughout session fo rsquat pivot transfers w/c<>bed/couch/booth in cafeteria with S-min A from low surfaces (I.e. couch). Pt supine in bed with OT instructing in deep breathing techniques while RN removing wound vac. Pt declined music for distraction/pain management. Pt propels w/c throughout session with MOD I in open spaces and min VC for use of LLE for steering in tight spaces in gift shop/around displays. Reviewed pressure relief strategies, timing and different positioning. Exited session with pt seated in w/c, call light in reach and all needs met  Therapy Documentation Precautions:  Precautions Precautions: Fall Restrictions Weight Bearing Restrictions: No RLE Weight Bearing: Non  weight bearing Other Position/Activity Restrictions: R limb guard w/ OOB activity General:   Vital Signs: Therapy Vitals Temp: 98.1 F (36.7 C) Temp Source: Oral Pulse Rate: 78 Resp: 12 BP: 126/75 Patient Position (if appropriate): Sitting Oxygen Therapy SpO2: 92 % Pain: Pain Assessment Pain Scale: 0-10 Pain Score: 7  Pain Type: Acute pain;Surgical pain Pain Location: Leg Pain Orientation: Right;Left Pain Descriptors / Indicators: Aching Pain Frequency: Constant Pain Onset: Gradual Pain Intervention(s): Medication (See eMAR) ADL: ADL ADL Comments: CGA with ADL transfers, min A with LB dressing and bathing and toileting Vision   Perception    Praxis   Exercises:   Other Treatments:     Therapy/Group: Individual Therapy  Tonny Branch 07/15/2018, 8:43 AM

## 2018-07-15 NOTE — Progress Notes (Signed)
Occupational Therapy Discharge Summary  Patient Details  Name: [name] MRN: [id number] Date of Birth: [birth year]   Patient has met 8 of 8 long term goals due to improved activity tolerance, improved balance, postural control, ability to compensate for deficits and improved coordination.  Patient to discharge at overall Modified Independent level.  Patient's care partner is independent to provide the necessary physical assistance at discharge.  Pt's daughter has been present during few OT sessions. Provided education and demonstration on donning technique of shrinker and wear schedule. Pt completes basic stand pivot transfers with RW at [place] I level. Have begun training in squat pivot transfers which pt requires supervision-min A for.  Pt reports family/friends can assist PRN for ADLs/IADLs at d/c.  Recommending IADLs be completed from w/c level.  Recommendation:  Patient will benefit from ongoing skilled OT services in home health setting to continue to advance functional skills in the area of BADL, iADL and Reduce care partner burden.  Equipment: Drop arm BSC  Reasons for discharge: treatment goals met and discharge from hospital  Patient/family agrees with progress made and goals achieved: Yes  OT Discharge Precautions/Restrictions  Precautions Precautions: Fall Restrictions RLE Weight Bearing: Non weight bearing Other Position/Activity Restrictions: R limb guard w/ OOB activity Vision Baseline Vision/History: No visual deficits Patient Visual Report: No change from baseline Vision Assessment?: No apparent visual deficits Perception  Perception: Within Functional Limits Praxis Praxis: Intact Cognition Overall Cognitive Status: Within Functional Limits for tasks assessed Arousal/Alertness: Awake/alert Orientation Level: Oriented X4 Memory: Appears intact Awareness: Appears intact Problem Solving: Appears intact Safety/Judgment: Appears  intact Sensation Sensation Light Touch: Appears Intact Coordination Gross Motor Movements are Fluid and Coordinated: No Fine Motor Movements are Fluid and Coordinated: Yes Coordination and Movement Description: gross movements impaired 2/2 new R BKA Motor  Motor Motor: Within Functional Limits Trunk/Postural Assessment  Cervical Assessment Cervical Assessment: Exceptions to WFL(Forward head) Thoracic Assessment Thoracic Assessment: Exceptions to WFL(Rounded shoulders) Lumbar Assessment Lumbar Assessment: Exceptions to WFL(Posterior pelvic tilt) Postural Control Postural Control: Within Functional Limits  Balance Balance Balance Assessed: Yes Static Sitting Balance Static Sitting - Balance Support: No upper extremity supported;Feet supported Static Sitting - Level of Assistance: 7: Independent Dynamic Sitting Balance Dynamic Sitting - Balance Support: No upper extremity supported;Feet supported Dynamic Sitting - Level of Assistance: 6: Modified independent (Device/Increase time) Sitting balance - Comments: Sitting in w/c to complete dressing task Static Standing Balance Static Standing - Balance Support: During functional activity;Right upper extremity supported;Left upper extremity supported Static Standing - Level of Assistance: 6: Modified independent (Device/Increase time) Static Standing - Comment/# of Minutes: Standing at [employer]  Dynamic Standing Balance Dynamic Standing - Balance Support: During functional activity;Right upper extremity supported;Left upper extremity supported Dynamic Standing - Level of Assistance: 6: Modified independent (Device/Increase time) Dynamic Standing - Comments: Standing with grab bar to complete LB clothing management/toileting tasks Extremity/Trunk Assessment RUE Assessment RUE Assessment: Within Functional Limits General Strength Comments: hx bursitis in shoulders LUE Assessment LUE Assessment: Within Functional Limits General Strength  Comments: hx bursitis in shoulders   [place], Amy L 07/15/2018, 4:30 PM

## 2018-07-15 NOTE — Discharge Summary (Addendum)
Physician Discharge Summary  Patient ID: [name] MRN: [id number] DOB/AGE: [birth year] 58 y.o.  Admit date: 07/11/2018 Discharge date: 07/16/2018  Discharge Diagnoses:  Principal Problem:   Unilateral complete BKA, right, initial encounter [facility]) Active Problems:   Leukocytosis   Acute blood loss anemia   Diabetes mellitus type 2 in obese (HCC)   Subtherapeutic international normalized ratio (INR)   Noncompliance   S/P unilateral BKA (below knee amputation), right (HCC)   Unilateral complete BKA, right, subsequent encounter [facility])   Discharged Condition: stable   Significant Diagnostic Studies:   Labs:  Basic Metabolic Panel: BMP Latest Ref Rng & Units 07/12/2018 07/08/2018 07/04/2018  Glucose 70 - 99 mg/dL 257(H) 223(H) 157(H)  BUN 6 - 20 mg/dL 13 16 12   Creatinine 0.44 - 1.00 mg/dL 0.89 0.97 0.75  BUN/Creat Ratio 9 - 23 - - -  Sodium 135 - 145 mmol/L 138 143 137  Potassium 3.5 - 5.1 mmol/L 4.2 4.4 3.8  Chloride 98 - 111 mmol/L 101 108 98  CO2 22 - 32 mmol/L 27 27 28   Calcium 8.9 - 10.3 mg/dL 8.7(L) 9.0 9.3    CBC: CBC Latest Ref Rng & Units 07/15/2018 07/12/2018 07/08/2018  WBC 4.0 - 10.5 K/uL 12.4(H) 13.1(H) 10.7(H)  Hemoglobin 12.0 - 15.0 g/dL 12.8 11.9(L) 14.6  Hematocrit 36.0 - 46.0 % 39.4 36.8 44.2  Platelets 150 - 400 K/uL 281 221 205    CBG: Recent Labs  Lab 07/15/18 1214 07/15/18 1644 07/15/18 2115 07/16/18 0645 07/16/18 1148  GLUCAP 103* 194* 177* 176* 118*    Lab Results  Component Value Date   INR 1.12 07/16/2018   INR 1.06 07/15/2018   INR 1.01 07/14/2018    Brief HPI:   [name] is a 58 year old with T2DM with neuropathy, DVT, HTN, COPD, tobacco abuse, PVD with multiple revascularizations who was admitted via MD office on 07/05/18 with workup revealing osteomyelitis right great toes. She was started on IV antibiotics and ABI showed evidence of severe PAD. Dr. Scot Dock was consulted for inputa nd recommended amputation as veins  felt to be "unconstructable". She was evaluated by Dr. Sharol Given and underwent R-BKA on 01/31. Post op pain control improving but continues to have persistent leucocytosis. Therapy ongoing and CIR recommended due to functional decline.    Hospital Course: [name] was admitted to rehab 07/11/2018 for inpatient therapies to consist of PT, ST and OT at least three hours five days a week. Past admission physiatrist, therapy team and rehab RN have worked together to provide customized collaborative inpatient rehab.  Pressures are monitored on twice daily basis and have been resolved reasonably controlled.  Wound VAC was kept in place until 02/06 and incision is noted to be healing well without any signs or symptoms of infection.  Mild erythema n along superior right patella.  Her diabetes has been monitored with AC at bedtime CBG checks.  Metformin was resumed on 2/6 and Lantus was discontinued prior to discharge.  She was advised to continue monitoring blood sugars AC/HS basis and follow-up with primary MD for further adjustment of diabetic regimen.  She was maintained on Coumadin and Lovenox was added for cross coverage.  INR continues to be subtherapeutic and pharmacist recommended concerns due to poor compliance with labs in the past.  Pharmacy recommended transitioning patient to Eliquis 2.5 mg p.o. twice daily.  Primary care provider was contacted and updated with recommendations.  Patient was discharged on Coumadin 10 mg on Saturday followed  by 5 mg daily until follow-up with primary MD on Tuesday.  Follow up CBC showed ABLA with persistent leucocytosis without fevers or evidence of infection. She has been educated on importance of tobacco cessation to help promote wound healing and avoid its detrimental effects on peripheral vascular disease. Home health RN to draw next pro time on 2/10 with results to Dr. Laurance Flatten.  Patient has made good progress during her rehab stay and is currently at modified  independent level.  Pain is well controlled and she denies need for any narcotics past discharge.  She will continue to receive further follow-up HH PT and HH RN by [facility] at home after discharge.   Rehab course: During patient's stay in rehab weekly team conference was held to monitor patient's progress, set goals and discuss barriers to discharge. At admission, patient required min assist with basic self-care task and min assist with mobility.  For the same she  has had improvement in activity tolerance, balance, postural control as well as ability to compensate for deficits. She is able to complete ADL tasks at modified independent level. She is modified independent for transfers and to ambulate 15 feet with rolling walker.  Family education was completed regarding all aspects of care and mobility    Disposition:  Home  Diet: Heart healthy/carb modified  Special Instructions: 1.  Monitor blood sugars AC at bedtime. 2.  Wash incision with soap and water.  Pat dry and wear stump shrinker daily. 3. HHRN to draw protime on 2/10 with results to Dr. Laurance Flatten.  4. Will need discussion regarding compliance with coumadin management. If unable  recommend change to Eliquis 2.5 mg bid.   Discharge Instructions    Ambulatory referral to Physical Medicine Rehab   Complete by:  As directed    1-2 weeks transitional care appt     Allergies as of 07/16/2018      Reactions   Aleve [naproxen Sodium] Hives, Itching   Cortisone Swelling   Internal organs   Prednisone Swelling   Makes internal organs swell   Vancomycin Itching      Medication List    STOP taking these medications   aspirin 325 MG tablet Replaced by:  aspirin 81 MG EC tablet   insulin aspart 100 UNIT/ML injection Commonly known as:  novoLOG   insulin glargine 100 UNIT/ML injection Commonly known as:  LANTUS   oxyCODONE 5 MG immediate release tablet Commonly known as:  Oxy IR/ROXICODONE   tretinoin 0.05 % cream Commonly known  as:  RETIN-A     TAKE these medications   acetaminophen 325 MG tablet Commonly known as:  TYLENOL Take 1-2 tablets (325-650 mg total) by mouth every 4 (four) hours as needed for mild pain.   albuterol 108 (90 Base) MCG/ACT inhaler Commonly known as:  PROVENTIL HFA;VENTOLIN HFA Inhale 2 puffs into the lungs every 6 (six) hours as needed for wheezing or shortness of breath.   aspirin 81 MG EC tablet Take 1 tablet (81 mg total) by mouth daily. Replaces:  aspirin 325 MG tablet   atorvastatin 40 MG tablet Commonly known as:  LIPITOR TAKE 1 TABLET DAILY   clopidogrel 75 MG tablet Commonly known as:  PLAVIX TAKE 1 TABLET DAILY What changed:  when to take this   enoxaparin 40 MG/0.4ML injection Commonly known as:  LOVENOX Inject 0.4 mLs (40 mg total) into the skin daily.   furosemide 20 MG tablet Commonly known as:  LASIX TAKE 1 TABLET DAILY   levothyroxine  100 MCG tablet Commonly known as:  SYNTHROID, LEVOTHROID TAKE (1) TABLET DAILY BE- FORE BREAKFAST. What changed:  See the new instructions.   losartan 50 MG tablet Commonly known as:  COZAAR Take 1 tablet (50 mg total) by mouth daily.   metFORMIN 500 MG tablet Commonly known as:  GLUCOPHAGE Take 1 tablet (500 mg total) by mouth 2 (two) times daily with a meal.   methocarbamol 500 MG tablet Commonly known as:  ROBAXIN Take 1 tablet (500 mg total) by mouth every 6 (six) hours as needed for muscle spasms.   nicotine 21 mg/24hr patch Commonly known as:  NICODERM CQ - dosed in mg/24 hours Place 1 patch (21 mg total) onto the skin daily.   omeprazole 20 MG capsule Commonly known as:  PRILOSEC TAKE (1) CAPSULE DAILY What changed:  See the new instructions.   ONE TOUCH ULTRA TEST test strip Generic drug:  glucose blood TEST BLOOD SUGARS TWICE A DAY   polyethylene glycol packet Commonly known as:  MIRALAX / GLYCOLAX Take 17 g by mouth daily as needed for moderate constipation.   senna 8.6 MG Tabs tablet Commonly  known as:  SENOKOT Take 1 tablet (8.6 mg total) by mouth daily. What changed:  when to take this   umeclidinium bromide 62.5 MCG/INH Aepb Commonly known as:  INCRUSE ELLIPTA Inhale 1 puff into the lungs daily.   Vitamin D3 125 MCG (5000 UT) Tabs Take 1 tablet by mouth daily.   warfarin 5 MG tablet Commonly known as:  COUMADIN Take as directed. If you are unsure how to take this medication, talk to your nurse or doctor. Original instructions:  Take 10 mg on Saturday. Then take 5 mg on Sunday and Monday at 6 PM.  What changed:  how much to take      Follow-up Information    Jamse Arn, MD Follow up.   Specialty:  Physical Medicine and Rehabilitation Why:  Office will call you with follow up appointment Contact information: [address] [place] [address] [phone]        Chipper Herb, MD Follow up on 07/19/2018.   Specialty:  Family Medicine Why:  Appointment @ 2:20 PM Contact information: [place] [address] [phone]        [name], MD. Call in 2 day(s).   Specialty:  Orthopedic Surgery Why:  for follow up  Contact information: [place] [address] [phone]           Signed: Bary Leriche 07/18/2018, 3:05 PM Delice Lesch, MD, ABPMR

## 2018-07-15 NOTE — Progress Notes (Signed)
ANTICOAGULATION CONSULT NOTE - Initial Consult  Pharmacy Consult for Coumadin Indication: DVT/PVD  Allergies  Allergen Reactions  . Aleve [Naproxen Sodium] Hives and Itching  . Cortisone Swelling    Internal organs  . Prednisone Swelling    Makes internal organs swell  . Vancomycin Itching    Patient Measurements: Height: 5\' 10"  (177.8 cm) Weight: 230 lb 13.2 oz (104.7 kg) IBW/kg (Calculated) : 68.5 Heparin Dosing Weight:   Vital Signs: Temp: 98.1 F (36.7 C) (02/07 0510) Temp Source: Oral (02/07 0510) BP: 126/75 (02/07 0510) Pulse Rate: 78 (02/07 0510)  Labs: Recent Labs    07/14/18 0541 07/15/18 0713  HGB  --  12.8  HCT  --  39.4  PLT  --  281  LABPROT 13.2 13.7  INR 1.01 1.06    Estimated Creatinine Clearance: 93.6 mL/min (by C-G formula based on SCr of 0.89 mg/dL).   Medical History: Past Medical History:  Diagnosis Date  . COPD (chronic obstructive pulmonary disease) ([place])   . Depression   . DM (diabetes mellitus) ([place])   . DVT (deep venous thrombosis) ([facility])    x5  . Family history of colon cancer   . GERD (gastroesophageal reflux disease)   . Hyperlipemia   . Hypertension   . Hypothyroid   . Obesity   . PVD (peripheral vascular disease) ([place])   . Uterine cancer ([place])   . UTI (urinary tract infection)     Medications:  Medications Prior to Admission  Medication Sig Dispense Refill Last Dose  . albuterol (PROVENTIL HFA;VENTOLIN HFA) 108 (90 Base) MCG/ACT inhaler Inhale 2 puffs into the lungs every 6 (six) hours as needed for wheezing or shortness of breath. 1 Inhaler 11 07/03/2018 at Unknown time  . aspirin 325 MG tablet Take 325 mg by mouth daily.   07/04/2018 at Unknown time  . atorvastatin (LIPITOR) 40 MG tablet TAKE 1 TABLET DAILY (Patient taking differently: Take 40 mg by mouth daily. ) 90 tablet 0 07/04/2018 at Unknown time  . Cholecalciferol (VITAMIN D3) 5000 units TABS Take 1 tablet by mouth daily.   07/04/2018 at Unknown time  .  clopidogrel (PLAVIX) 75 MG tablet TAKE 1 TABLET DAILY (Patient taking differently: Take 75 mg by mouth once. ) 90 tablet 0 07/04/2018 at Unknown time  . furosemide (LASIX) 20 MG tablet TAKE 1 TABLET DAILY (Patient taking differently: Take 20 mg by mouth daily. ) 90 tablet 0 07/04/2018 at Unknown time  . insulin aspart (NOVOLOG) 100 UNIT/ML injection Inject 0-15 Units into the skin 3 (three) times daily with meals.     . insulin aspart (NOVOLOG) 100 UNIT/ML injection Inject 0-5 Units into the skin at bedtime.     . insulin glargine (LANTUS) 100 UNIT/ML injection Inject 0.2 mLs (20 Units total) into the skin at bedtime.     Marland Kitchen levothyroxine (SYNTHROID, LEVOTHROID) 100 MCG tablet TAKE (1) TABLET DAILY BE- FORE BREAKFAST. (Patient taking differently: Take 100 mcg by mouth daily. ) 90 tablet 0 07/04/2018 at Unknown time  . losartan (COZAAR) 50 MG tablet TAKE 1 TABLET DAILY (Patient taking differently: Take 50 mg by mouth daily. ) 90 tablet 0 07/04/2018 at Unknown time  . omeprazole (PRILOSEC) 20 MG capsule TAKE (1) CAPSULE DAILY (Patient taking differently: Take 20 mg by mouth daily. ) 90 capsule 0 07/04/2018 at Unknown time  . ONE TOUCH ULTRA TEST test strip TEST BLOOD SUGARS TWICE A DAY 50 each 2 Taking  . oxyCODONE (OXY IR/ROXICODONE) 5 MG immediate release  tablet Take 1-2 tablets (5-10 mg total) by mouth every 4 (four) hours as needed for moderate pain.     . polyethylene glycol (MIRALAX / GLYCOLAX) packet Take 17 g by mouth daily as needed for moderate constipation.     . senna (SENOKOT) 8.6 MG TABS tablet Take 1 tablet (8.6 mg total) by mouth 2 (two) times daily.     Marland Kitchen tretinoin (RETIN-A) 0.05 % cream APPLY TO AFFECTED AREAS AT BEDTIME (Patient taking differently: Apply 1 application topically at bedtime. To affected areas.) 20 g 0 07/03/2018 at Unknown time  . umeclidinium bromide (INCRUSE ELLIPTA) 62.5 MCG/INH AEPB Inhale 1 puff into the lungs daily. 30 each 3 07/03/2018 at Unknown time  . warfarin  (COUMADIN) 5 MG tablet Take 1 tablet (5 mg total) by mouth daily at 6 PM for 1 dose.      Scheduled:  . aspirin EC  81 mg Oral Daily  . atorvastatin  40 mg Oral Daily  . cholecalciferol  5,000 Units Oral Daily  . clopidogrel  75 mg Oral Daily  . enoxaparin (LOVENOX) injection  40 mg Subcutaneous Q24H  . furosemide  20 mg Oral Daily  . insulin aspart  0-15 Units Subcutaneous TID WC  . insulin aspart  0-5 Units Subcutaneous QHS  . insulin glargine  10 Units Subcutaneous QHS  . levothyroxine  100 mcg Oral Daily  . losartan  50 mg Oral Daily  . metFORMIN  500 mg Oral BID WC  . nicotine  21 mg Transdermal Daily  . pantoprazole  40 mg Oral Daily  . senna  1 tablet Oral Daily  . warfarin  10 mg Oral ONCE-1800  . Warfarin - Pharmacist Dosing Inpatient   Does not apply q1800   Infusions:    Assessment: Pt just got tx from 5W at [facility] after a BKA. She was on Coumadin while in [place], however, she rarely gets her INR checked there. Coumadin has been ordered to be resumed here. D/w with Dr. Posey Pronto, we will put her on DVT px lovenox until INR is therapeutic.   She refused INR recently because she doesn't like to be stick. Spent a good 30 minutes talking to her about the need to get INR if she is to stay on coumadin. She agrees to get it starting tomorrow. She is not even a pt with Dr. Scot Dock anymore. I believe it's more feasible for her to be on apixaban 2.5mg  BID for PVD rather than DVT.  Dr. Posey Pronto will discuss the potential use of apixaban with her and eval the cost. INR 1.06 today after several doses. We will increase to 10mg  today. F/u with potential for either coumadin or apixaban.  Goal of Therapy:  INR 2-3 Monitor platelets by anticoagulation protocol: Yes   Plan:   Coumadin 10mg  PO x1 Lovenox 40mg  SQ qday Daily INR F/u with apixaban  Onnie Boer, PharmD, BCIDP, AAHIVP, CPP Infectious Disease Pharmacist 07/15/2018 8:24 AM

## 2018-07-15 NOTE — Progress Notes (Signed)
Social Work  Discharge Note  The overall goal for the admission was met for: DC SAT 2/8  Discharge location: [place] TO ASSIST  Length of Stay: Yes-5 DAYS  Discharge activity level: Yes-MOD/I [place]  Home/community participation: Yes  Services provided included: MD, RD, PT, OT, RN, CM, TR, Pharmacy and SW  Financial Services: Medicare  Follow-up services arranged: Home Health: [facility]-PT & RN, DME: [facility]-R-AMPUTEE PAD AND DROP ARM BEDSIDE COMMODE and Patient/Family has no preference for HH/DME agencies  Comments (or additional information):PT DID WELL AND REACHED HER GOALS QUICKLY. WILL BE COMPLAINT AND PLANS TO QUIT SMOKING  Patient/Family verbalized understanding of follow-up arrangements: Yes  Individual responsible for coordination of the follow-up plan: SELF & DANNY-HUSBAND  Confirmed correct DME delivered: [name] 07/15/2018    [name]

## 2018-07-16 ENCOUNTER — Inpatient Hospital Stay (HOSPITAL_COMMUNITY): Payer: Medicare Other

## 2018-07-16 ENCOUNTER — Inpatient Hospital Stay (HOSPITAL_COMMUNITY): Payer: Medicare Other | Admitting: Physical Therapy

## 2018-07-16 DIAGNOSIS — Z86718 Personal history of other venous thrombosis and embolism: Secondary | ICD-10-CM

## 2018-07-16 LAB — GLUCOSE, CAPILLARY
GLUCOSE-CAPILLARY: 118 mg/dL — AB (ref 70–99)
Glucose-Capillary: 176 mg/dL — ABNORMAL HIGH (ref 70–99)

## 2018-07-16 LAB — PROTIME-INR
INR: 1.12
Prothrombin Time: 14.4 seconds (ref 11.4–15.2)

## 2018-07-16 MED ORDER — WARFARIN SODIUM 5 MG PO TABS: 10.0000 mg | ORAL_TABLET | Freq: Once | ORAL | Status: DC

## 2018-07-16 NOTE — Progress Notes (Signed)
ANTICOAGULATION CONSULT NOTE - Initial Consult  Pharmacy Consult for Coumadin Indication: DVT/PVD  Allergies  Allergen Reactions  . Aleve [Naproxen Sodium] Hives and Itching  . Cortisone Swelling    Internal organs  . Prednisone Swelling    Makes internal organs swell  . Vancomycin Itching    Patient Measurements: Height: 5\' 10"  (177.8 cm) Weight: 230 lb 13.2 oz (104.7 kg) IBW/kg (Calculated) : 68.5 Heparin Dosing Weight:   Vital Signs: Temp: 98.2 F (36.8 C) (02/08 0624) Temp Source: Oral (02/08 0624) BP: 144/81 (02/08 0624) Pulse Rate: 97 (02/08 0624)  Labs: Recent Labs    07/14/18 0541 07/15/18 0713 07/16/18 0609  HGB  --  12.8  --   HCT  --  39.4  --   PLT  --  281  --   LABPROT 13.2 13.7 14.4  INR 1.01 1.06 1.12    Estimated Creatinine Clearance: 93.6 mL/min (by C-G formula based on SCr of 0.89 mg/dL).   Medical History: Past Medical History:  Diagnosis Date  . COPD (chronic obstructive pulmonary disease) ([place])   . Depression   . DM (diabetes mellitus) ([place])   . DVT (deep venous thrombosis) ([facility])    x5  . Family history of colon cancer   . GERD (gastroesophageal reflux disease)   . Hyperlipemia   . Hypertension   . Hypothyroid   . Obesity   . PVD (peripheral vascular disease) ([place])   . Uterine cancer ([place])   . UTI (urinary tract infection)     Medications:  Medications Prior to Admission  Medication Sig Dispense Refill Last Dose  . albuterol (PROVENTIL HFA;VENTOLIN HFA) 108 (90 Base) MCG/ACT inhaler Inhale 2 puffs into the lungs every 6 (six) hours as needed for wheezing or shortness of breath. 1 Inhaler 11 07/03/2018 at Unknown time  . aspirin 325 MG tablet Take 325 mg by mouth daily.   07/04/2018 at Unknown time  . atorvastatin (LIPITOR) 40 MG tablet TAKE 1 TABLET DAILY (Patient taking differently: Take 40 mg by mouth daily. ) 90 tablet 0 07/04/2018 at Unknown time  . Cholecalciferol (VITAMIN D3) 5000 units TABS Take 1 tablet by mouth daily.    07/04/2018 at Unknown time  . clopidogrel (PLAVIX) 75 MG tablet TAKE 1 TABLET DAILY (Patient taking differently: Take 75 mg by mouth once. ) 90 tablet 0 07/04/2018 at Unknown time  . furosemide (LASIX) 20 MG tablet TAKE 1 TABLET DAILY (Patient taking differently: Take 20 mg by mouth daily. ) 90 tablet 0 07/04/2018 at Unknown time  . insulin aspart (NOVOLOG) 100 UNIT/ML injection Inject 0-15 Units into the skin 3 (three) times daily with meals.     . insulin aspart (NOVOLOG) 100 UNIT/ML injection Inject 0-5 Units into the skin at bedtime.     . insulin glargine (LANTUS) 100 UNIT/ML injection Inject 0.2 mLs (20 Units total) into the skin at bedtime.     Marland Kitchen levothyroxine (SYNTHROID, LEVOTHROID) 100 MCG tablet TAKE (1) TABLET DAILY BE- FORE BREAKFAST. (Patient taking differently: Take 100 mcg by mouth daily. ) 90 tablet 0 07/04/2018 at Unknown time  . omeprazole (PRILOSEC) 20 MG capsule TAKE (1) CAPSULE DAILY (Patient taking differently: Take 20 mg by mouth daily. ) 90 capsule 0 07/04/2018 at Unknown time  . ONE TOUCH ULTRA TEST test strip TEST BLOOD SUGARS TWICE A DAY 50 each 2 Taking  . oxyCODONE (OXY IR/ROXICODONE) 5 MG immediate release tablet Take 1-2 tablets (5-10 mg total) by mouth every 4 (four) hours as needed  for moderate pain.     . polyethylene glycol (MIRALAX / GLYCOLAX) packet Take 17 g by mouth daily as needed for moderate constipation.     . senna (SENOKOT) 8.6 MG TABS tablet Take 1 tablet (8.6 mg total) by mouth 2 (two) times daily.     Marland Kitchen tretinoin (RETIN-A) 0.05 % cream APPLY TO AFFECTED AREAS AT BEDTIME (Patient taking differently: Apply 1 application topically at bedtime. To affected areas.) 20 g 0 07/03/2018 at Unknown time  . umeclidinium bromide (INCRUSE ELLIPTA) 62.5 MCG/INH AEPB Inhale 1 puff into the lungs daily. 30 each 3 07/03/2018 at Unknown time  . [DISCONTINUED] losartan (COZAAR) 50 MG tablet TAKE 1 TABLET DAILY (Patient taking differently: Take 50 mg by mouth daily. ) 90 tablet 0  07/04/2018 at Unknown time  . [DISCONTINUED] warfarin (COUMADIN) 5 MG tablet Take 1 tablet (5 mg total) by mouth daily at 6 PM for 1 dose.      Scheduled:  . aspirin EC  81 mg Oral Daily  . atorvastatin  40 mg Oral Daily  . cholecalciferol  5,000 Units Oral Daily  . clopidogrel  75 mg Oral Daily  . enoxaparin (LOVENOX) injection  40 mg Subcutaneous Q24H  . furosemide  20 mg Oral Daily  . insulin aspart  0-15 Units Subcutaneous TID WC  . insulin aspart  0-5 Units Subcutaneous QHS  . levothyroxine  100 mcg Oral Daily  . losartan  50 mg Oral Daily  . metFORMIN  500 mg Oral BID WC  . nicotine  21 mg Transdermal Daily  . pantoprazole  40 mg Oral Daily  . senna  1 tablet Oral Daily  . Warfarin - Pharmacist Dosing Inpatient   Does not apply q1800   Infusions:    Assessment: Pt just got tx from 5W at [facility] after a BKA. She was on Coumadin while in [place], however, she rarely gets her INR checked there. Coumadin has been ordered to be resumed here. D/w with Dr. Posey Pronto, we will put her on DVT px lovenox until INR is therapeutic.   She refused INR recently because she doesn't like to be stick. Spent a good 30 minutes talking to her about the need to get INR if she is to stay on coumadin. She agrees to get it starting tomorrow. She is not even a pt with Dr. Scot Dock anymore. I believe it's more feasible for her to be on apixaban 2.5mg  BID for PVD rather than DVT.  Dr. Posey Pronto will discuss the potential use of apixaban with her and eval the cost.   INR remains subtherapeutic at 1.12 today. Hgb and plt stable. No reports of bleeding  Goal of Therapy:  INR 2-3 Monitor platelets by anticoagulation protocol: Yes   Plan:  Plan to DC today Instructed pt to take 10mg  warfarin tonight, followed by 5mg  daily starting Sunday until appointment on Tuesday Instructed pt to continue lovenox 40mg  SQ daily until appointment with PCP Plans to switch to Eliquis at PCP visit Daily INR while here - Home health  visit Monday for INR    Juanell Fairly, PharmD PGY1 Pharmacy Resident Phone [phone] 07/16/2018 10:55 AM

## 2018-07-16 NOTE — Progress Notes (Signed)
Occupational Therapy Session Note  Patient Details  Name: [name] MRN: [id number] Date of Birth: [birth year]  Today's Date: 07/16/2018 OT Individual Time: 6811-5726 OT Individual Time Calculation (min): 72 min    Short Term Goals: Week 1:  OT Short Term Goal 1 (Week 1): STGs = LTGs  Skilled Therapeutic Interventions/Progress Updates:    1:1. Pt received in w/c doing oral care. Pt instructed in covering residual limb with bag and tape for shower and able to completes with supervision. Pt ambulates with RW ~5 feet into bathroom w/c<>TTB with non skid sock at MOD I level. Pt completes bathing seated at MOD I level. Pt dresses EOB MOD I using lateral leans to advance pants past hips. Pt completes toileting with MOD I using DAC. Educated pt on w/c level cleaning with long handled equipment from w/c level, however pt reports wanting to pay someone to clean house. Exited session with pt seated in w/c, call light in reach and all needs met.  Therapy Documentation Precautions:  Precautions Precautions: Fall Restrictions Weight Bearing Restrictions: Yes RLE Weight Bearing: Non weight bearing Other Position/Activity Restrictions: R limb guard w/ OOB activity General:   Vital Signs: Therapy Vitals Temp: 98.2 F (36.8 C) Temp Source: Oral Pulse Rate: 97 Resp: 18 BP: (!) 144/81 Patient Position (if appropriate): Lying Oxygen Therapy SpO2: 99 % O2 Device: Room Air Pain: Pain Assessment Pain Scale: 0-10 Pain Score: 5  Pain Type: Acute pain;Surgical pain Pain Location: Leg Pain Orientation: Right Pain Descriptors / Indicators: Aching Pain Frequency: Intermittent Pain Onset: On-going Patients Stated Pain Goal: 2 Pain Intervention(s): Medication (See eMAR);Repositioned   Therapy/Group: Individual Therapy  Tonny Branch 07/16/2018, 9:51 AM

## 2018-07-16 NOTE — Plan of Care (Signed)
  Problem: Consults Goal: RH LIMB LOSS PATIENT EDUCATION Description Description: See Patient Education module for eduction specifics. Outcome: Completed/Met Goal: Diabetes Guidelines if Diabetic/Glucose > 140 Description If diabetic or lab glucose is > 140 mg/dl - Initiate Diabetes/Hyperglycemia Guidelines & Document Interventions  Outcome: Completed/Met Goal: Skin Care Protocol Initiated - if Braden Score 18 or less Description If consults are not indicated, leave blank or document N/A Outcome: Completed/Met   Problem: RH BOWEL ELIMINATION Goal: RH STG MANAGE BOWEL WITH ASSISTANCE Description STG Manage Bowel with Mod I Assistance.  Outcome: Completed/Met Goal: RH STG MANAGE BOWEL W/MEDICATION W/ASSISTANCE Description STG Manage Bowel with Medication with Mod I Assistance.  Outcome: Completed/Met   Problem: RH SKIN INTEGRITY Goal: RH STG SKIN FREE OF INFECTION/BREAKDOWN Description Mod I  Outcome: Completed/Met Goal: RH STG ABLE TO PERFORM INCISION/WOUND CARE W/ASSISTANCE Description STG Able To Perform Incision/Wound Care With Mod I Assistance.  Outcome: Completed/Met   Problem: RH SAFETY Goal: RH STG ADHERE TO SAFETY PRECAUTIONS W/ASSISTANCE/DEVICE Description STG Adhere to Safety Precautions With Supervision Assistance/Device.  Outcome: Completed/Met Goal: RH STG DECREASED RISK OF FALL WITH ASSISTANCE Description STG Decreased Risk of Fall With [employer].  Outcome: Completed/Met   Problem: RH PAIN MANAGEMENT Goal: RH STG PAIN MANAGED AT OR BELOW PT'S PAIN GOAL Description <2/10  Outcome: Completed/Met   Problem: RH KNOWLEDGE DEFICIT LIMB LOSS Goal: RH STG INCREASE KNOWLEDGE OF SELF CARE AFTER LIMB LOSS Description Patient will be able to verbalize self care of residual limb, pain management Mod I with cues/handouts  Outcome: Completed/Met

## 2018-07-16 NOTE — Progress Notes (Signed)
Pt finishing up with therapy. [place], PT to transport to main lobby for DC. Pt family present and belongings already been taken home. No further complaints/requests noted.   Erie Noe, LPN

## 2018-07-16 NOTE — Progress Notes (Signed)
Physical Therapy Discharge Summary  Patient Details  Name: [name] MRN: [id number] Date of Birth: [birth year]  Today's Date: 07/16/2018 PT Individual Time: 1100-1140 PT Individual Time Calculation (min): 40 min    Patient has met 10 of 10 long term goals due to improved activity tolerance, improved balance, improved postural control, increased strength and ability to compensate for deficits.  Patient to discharge at a wheelchair level Modified Independent.   Patient's care partner is independent to provide the necessary setup assistance at discharge.  Reasons goals not met: Pt has met all rehab goals.  Recommendation:  Patient will benefit from ongoing skilled PT services in home health setting to continue to advance safe functional mobility, address ongoing impairments in balance, endurance, LE strength, and minimize fall risk.  Equipment: RW and manual w/c with R residual limb support  Reasons for discharge: treatment goals met and discharge from hospital  Patient/family agrees with progress made and goals achieved: Yes  PT Discharge Precautions/Restrictions Precautions Precautions: Fall Restrictions Weight Bearing Restrictions: Yes RLE Weight Bearing: Non weight bearing Other Position/Activity Restrictions: R limb guard w/ OOB activity Vision/Perception  Perception Perception: Within Functional Limits Praxis Praxis: Intact  Cognition Overall Cognitive Status: Within Functional Limits for tasks assessed Arousal/Alertness: Awake/alert Orientation Level: Oriented X4 Memory: Appears intact Awareness: Appears intact Problem Solving: Appears intact Safety/Judgment: Appears intact Sensation Sensation Light Touch: Appears Intact(hypersensitivity at R residual limb site) Proprioception: Appears Intact Coordination Gross Motor Movements are Fluid and Coordinated: Yes Fine Motor Movements are Fluid and Coordinated: Yes Coordination and Movement Description:  improved since eval Motor  Motor Motor: Within Functional Limits  Mobility Bed Mobility Bed Mobility: Rolling Right;Rolling Left;Supine to Sit;Sit to Supine Rolling Right: Independent with assistive device Rolling Left: Independent with assistive device Supine to Sit: Independent with assistive device Sit to Supine: Independent with assistive device Transfers Transfers: Sit to Stand;Stand to Sit;Stand Pivot Transfers Sit to Stand: Independent with assistive device Stand to Sit: Independent with assistive device Stand Pivot Transfers: Independent with assistive device Transfer (Assistive device): Rolling walker Locomotion  Gait Ambulation: Yes Gait Assistance: Independent with assistive device Gait Distance (Feet): 15 Feet Assistive device: Rolling walker Gait Gait: Yes Gait Pattern: Impaired Gait Pattern: Step-to pattern(2/2 R BKA) Gait velocity: decreased Stairs / Additional Locomotion Stairs: No Wheelchair Mobility Wheelchair Mobility: Yes Wheelchair Assistance: Independent with Camera operator: Both upper extremities Wheelchair Parts Management: Independent Distance: 200  Trunk/Postural Assessment  Cervical Assessment Cervical Assessment: Exceptions to WFL(forward head) Thoracic Assessment Thoracic Assessment: Exceptions to WFL(rounded shoulders) Lumbar Assessment Lumbar Assessment: Exceptions to WFL(posterior pelvic tilt) Postural Control Postural Control: Within Functional Limits  Balance Balance Balance Assessed: Yes Static Sitting Balance Static Sitting - Balance Support: No upper extremity supported;Feet supported Static Sitting - Level of Assistance: 7: Independent Dynamic Sitting Balance Dynamic Sitting - Balance Support: No upper extremity supported;Feet supported Dynamic Sitting - Level of Assistance: 6: Modified independent (Device/Increase time) Static Standing Balance Static Standing - Balance Support: Bilateral upper  extremity supported;During functional activity Static Standing - Level of Assistance: 6: Modified independent (Device/Increase time) Dynamic Standing Balance Dynamic Standing - Balance Support: Bilateral upper extremity supported;During functional activity Dynamic Standing - Level of Assistance: 6: Modified independent (Device/Increase time) Extremity Assessment   RLE Assessment RLE Assessment: Exceptions to [facility] Passive Range of Motion (PROM) Comments: limited in knee 2/2 pain and hx of surgery General Strength Comments: 4/5 hip flex, knee not tested 2/2 pain LLE Assessment LLE Assessment: Within Functional Limits  Excell Seltzer, PT, DPT  07/16/2018, 12:46 PM

## 2018-07-16 NOTE — Progress Notes (Signed)
Pt demonstrated removing drsg to R BKA, cleaning site with NS, applying telfa and stump shrinker. Pt self administered lovenox injection. Pt appropriate for DC. No additional teaching needed.   Erie Noe, LPN

## 2018-07-16 NOTE — Progress Notes (Signed)
Orthopedic Tech Progress Note Patient Details:  [name] 29-Oct-1960 [id number]  Patient ID: [name], female   DOB: [birth year], 58 y.o.   MRN: [id number]   [place] Pink 07/16/2018, 11:19 AMCalled [facility] for BKA stump shrinker.

## 2018-07-16 NOTE — Progress Notes (Signed)
Physical Therapy Session Note  Patient Details  Name: [name] MRN: [id number] Date of Birth: Jan 05, 1961  Today's Date: 07/16/2018 PT Individual Time: 1100-1140 PT Individual Time Calculation (min): 40 min   Short Term Goals: Week 1:  PT Short Term Goal 1 (Week 1): =LTGs due to ELOS  Skilled Therapeutic Interventions/Progress Updates:    Pt received seated in w/c in room, agreeable to PT session. No complaints of pain. Pt is able to propel manual w/c up to 200 ft with use of BUE and mod I. Car transfer with setup A. Ambulation x 15 ft with RW mod I. Pt performs all bed mobility and transfers at mod I level. Reviewed education with regards to residual limb positioning, shrinker schedule, phantom pain management, and care for residual limb. Pt demos good understanding of amputee education provided. Pt left seated in w/c in room with needs in reach.  Therapy Documentation Precautions:  Precautions Precautions: Fall Restrictions Weight Bearing Restrictions: Yes RLE Weight Bearing: Non weight bearing Other Position/Activity Restrictions: R limb guard w/ OOB activity   Therapy/Group: Individual Therapy  Excell Seltzer, PT, DPT  07/16/2018, 3:53 PM

## 2018-07-16 NOTE — Progress Notes (Signed)
Patient medically stable for discharge today.  We will see patient for transitional care management in 1 to 2 weeks post discharge.  Patient will require ambulatory follow-up for INR and, BP, DM.  Incision healing with mild serous drainage lateral incision site.  Please also see discharge summary.

## 2018-07-16 NOTE — Progress Notes (Signed)
Physical Therapy Session Note  Patient Details  Name: [name] MRN: [id number] Date of Birth: [birth year]  Today's Date: 07/16/2018 PT Individual Time: 5462-7035 PT Individual Time Calculation (min): 38 min   Short Term Goals: Week 1:  PT Short Term Goal 1 (Week 1): =LTGs due to ELOS  Skilled Therapeutic Interventions/Progress Updates:   Pt received sitting in WC and agreeable to PT. PT instructed pt in WC mobility x 127ft, 364ft + 448ft without cues or assist from PT. Pt noted ot utilize BUE and LLE to control WC around obstacles. Gait training with RW x 15 without cues or assist. Pt noted to use minimal UE strength to advance the LLE due to Bil shoulder pain from chronic bursitis. PT instructed pt in Supine therex with HEP hand out provided glute sets. SAQ, hip extension. Hip abduction. And LAQ. X 10 BLE with min cues for speed and ROM. PT transported pt to entrance of hospital for car transfer using stand pivot transfer and RW without assist from PT. Pt d/c from rehab upon transfer to car. RN aware.        Therapy Documentation Precautions:  Precautions Precautions: Fall Restrictions Weight Bearing Restrictions: Yes RLE Weight Bearing: Non weight bearing Other Position/Activity Restrictions: R limb guard w/ OOB activity   Pain:   denies at rest    Therapy/Group: Individual Therapy  Lorie Phenix 07/16/2018, 1:43 PM

## 2018-07-19 ENCOUNTER — Telehealth: Payer: Self-pay | Admitting: Family Medicine

## 2018-07-19 ENCOUNTER — Telehealth: Payer: Self-pay | Admitting: Registered Nurse

## 2018-07-19 ENCOUNTER — Encounter: Payer: Self-pay | Admitting: Family Medicine

## 2018-07-19 ENCOUNTER — Ambulatory Visit (INDEPENDENT_AMBULATORY_CARE_PROVIDER_SITE_OTHER): Payer: Medicare Other | Admitting: Family Medicine

## 2018-07-19 VITALS — BP 116/80 | HR 91 | Temp 97.1°F

## 2018-07-19 DIAGNOSIS — R791 Abnormal coagulation profile: Secondary | ICD-10-CM | POA: Diagnosis not present

## 2018-07-19 DIAGNOSIS — S88111A Complete traumatic amputation at level between knee and ankle, right lower leg, initial encounter: Secondary | ICD-10-CM | POA: Diagnosis not present

## 2018-07-19 DIAGNOSIS — I739 Peripheral vascular disease, unspecified: Secondary | ICD-10-CM

## 2018-07-19 DIAGNOSIS — F17213 Nicotine dependence, cigarettes, with withdrawal: Secondary | ICD-10-CM

## 2018-07-19 DIAGNOSIS — R21 Rash and other nonspecific skin eruption: Secondary | ICD-10-CM

## 2018-07-19 LAB — COAGUCHEK XS/INR WAIVED
INR: 1.4 — ABNORMAL HIGH (ref 0.9–1.1)
PROTHROMBIN TIME: 16.8 s

## 2018-07-19 MED ORDER — CETIRIZINE HCL 10 MG PO TBDP: 10.0000 mg | ORAL_TABLET | Freq: Every day | ORAL | 0 refills | Status: DC

## 2018-07-19 MED ORDER — FAMOTIDINE 20 MG PO TABS: 20.0000 mg | ORAL_TABLET | Freq: Two times a day (BID) | ORAL | 0 refills | Status: DC

## 2018-07-19 MED ORDER — TRIAMCINOLONE ACETONIDE 0.1 % EX CREA: 1.0000 "application " | TOPICAL_CREAM | Freq: Two times a day (BID) | CUTANEOUS | 0 refills | Status: DC

## 2018-07-19 MED ORDER — APIXABAN 2.5 MG PO TABS: 2.5000 mg | ORAL_TABLET | Freq: Two times a day (BID) | ORAL | 2 refills | Status: DC

## 2018-07-19 MED ORDER — NICOTINE 21 MG/24HR TD PT24: 21.0000 mg | MEDICATED_PATCH | Freq: Every day | TRANSDERMAL | 0 refills | Status: DC

## 2018-07-19 NOTE — Telephone Encounter (Signed)
Please send in RX

## 2018-07-19 NOTE — Telephone Encounter (Signed)
Transitional Care call Transitional Call Completed  Patient name: [name]- Ziglar  DOB: [birth year] 1. Are you/is patient experiencing any problems since coming home? No a. Are there any questions regarding any aspect of care? No 2. Are there any questions regarding medications administration/dosing? No a. Are meds being taken as prescribed? Yes b. "Patient should review meds with caller to confirm" Medication List Reviewed 3. Have there been any falls? No 4. Has Home Health been to the house and/or have they contacted you? [name]. Dola Factor states she refused Home Health. a. If not, have you tried to contact them? NA b. Can we help you contact them? NA 5. Are bowels and bladder emptying properly? Yes a. Are there any unexpected incontinence issues? No b. If applicable, is patient following bowel/bladder programs? NA 6. Any fevers, problems with breathing, unexpected pain? No 7. Are there any skin problems or new areas of breakdown? No 8. Has the patient/family member arranged specialty MD follow up (ie cardiology/neurology/renal/surgical/etc.)?  She has scheduled HFU appointments.  a. Can we help arrange? NA 9. Does the patient need any other services or support that we can help arrange? No 10. Are caregivers following through as expected in assisting the patient? Yes 11. Has the patient quit smoking, drinking alcohol, or using drugs as recommended? (                         Appointment date/time [name] refuses to come for TC visit. She seen her PCP today and states she has a scheduled appointment with Dr. Sharol Given.

## 2018-07-19 NOTE — Telephone Encounter (Signed)
She should have coumadin. She has an appointment next week with Dr. Laurance Flatten. She needs to continue the Lovenox and coumadin if she is not going to switch to the Eliquis.

## 2018-07-19 NOTE — Telephone Encounter (Signed)
Attempted to call pt regarding her Coumadin dosing. I left a message for her to call the office pertaining to this.  She was to stop the coumadin and Lovenox and start eliquis but was unable to afford the eliquis. If she restarts the coumadin, she needs to continue the Lovenox also. She should take 7.5 mg of coumadin for the next two days and then go back to the 5 mg daily. She has an appointment on 02/129/2020 to have her INR rechecked.

## 2018-07-19 NOTE — Progress Notes (Signed)
Subjective:  Patient ID: [name], female    DOB: 12/27/1960, 58 y.o.   MRN: [id number]  Chief Complaint:  Hospitalization Follow-up (patient reports she took no potassium while she was in the hospital but she has since restarted it since coming home)   HPI: [name] is a 58 y.o. female presenting on 07/19/2018 for Hospitalization Follow-up (patient reports she took no potassium while she was in the hospital but she has since restarted it since coming home)   1. Peripheral vascular insufficiency (HCC)  Currently on Lovenox and Coumadin. Pt is noncompliant with INR checks. Recommendations from pharmacist (recent hospital admission) made to switch pt from Coumadin and Lovenox due to consistent subtherapeutic INR levels and noncompliance with medication adjustment regimen.   2. Unilateral complete BKA, right, initial encounter ([place])  Osteomyelitis of right great toe diagnosed on 07/04/2018. Pt was sent from PCP office to hospital for evaluation and treatment. During hospital stay it was determined that due to PVD and osteomyelitis, a right BKA was warranted. Pt had the right BKA on 07/08/2008. Pt has been doing well since the procedure. She has been participating in physical rehabilitation. She has her follow up appointments scheduled.    3. Subtherapeutic international normalized ratio (INR) \ Noncompliant with INR checks. Persistent subtherapeutic INR levels.    4. Rash  Continued rash to anterior neck and chin. Denies changes in personal hygiene or household products. States the atarax helped with the pruritis but did not help the rash.   5. Cigarette nicotine dependence with withdrawal  Has not had a cigarette since admission to the hospital. States she has severe cravings on a daily basis and feels she may start smoking again if she does not take something to help the cravings.       Relevant past medical, surgical, family, and social history reviewed and  updated as indicated.  Allergies and medications reviewed and updated.   Past Medical History:  Diagnosis Date  . COPD (chronic obstructive pulmonary disease) ([place])   . Depression   . DM (diabetes mellitus) ([place])   . DVT (deep venous thrombosis) (HCC)    x5  . Family history of colon cancer   . GERD (gastroesophageal reflux disease)   . Hyperlipemia   . Hypertension   . Hypothyroid   . Obesity   . PVD (peripheral vascular disease) ([place])   . Uterine cancer ([place])   . UTI (urinary tract infection)     Past Surgical History:  Procedure Laterality Date  . ABDOMINAL HYSTERECTOMY    . AMPUTATION Right 07/08/2018   Procedure: RIGHT BELOW KNEE AMPUTATION;  Surgeon: [name], MD;  Location: [facility];  Service: Orthopedics;  Laterality: Right;  . CESAREAN SECTION    . FEMORAL BYPASS     x 5  . LUMBAR DISC SURGERY     L4-L5  . TOE AMPUTATION     right  . TONSILLECTOMY      Social History   Socioeconomic History  . Marital status: Married    Spouse name: Not on file  . Number of children: 1  . Years of education: Not on file  . Highest education level: Not on file  Occupational History  . Occupation: Product manager: [employer]  . Financial resource strain: Not on file  . Food insecurity:    Worry: Not on file    Inability: Not on file  . Transportation needs:  Medical: Not on file    Non-medical: Not on file  Tobacco Use  . Smoking status: Current Every Day Smoker    Packs/day: 0.50    Years: 38.00    Pack years: 19.00    Types: Cigarettes  . Smokeless tobacco: Never Used  Substance and Sexual Activity  . Alcohol use: No    Alcohol/week: 0.0 standard drinks  . Drug use: No  . Sexual activity: Not on file  Lifestyle  . Physical activity:    Days per week: Not on file    Minutes per session: Not on file  . Stress: Not on file  Relationships  . Social connections:    Talks on phone: Not on file    Gets together: Not on file     Attends religious service: Not on file    Active member of club or organization: Not on file    Attends meetings of clubs or organizations: Not on file    Relationship status: Not on file  . Intimate partner violence:    Fear of current or ex partner: Not on file    Emotionally abused: Not on file    Physically abused: Not on file    Forced sexual activity: Not on file  Other Topics Concern  . Not on file  Social History Narrative  . Not on file    Outpatient Encounter Medications as of 07/19/2018  Medication Sig  . acetaminophen (TYLENOL) 325 MG tablet Take 1-2 tablets (325-650 mg total) by mouth every 4 (four) hours as needed for mild pain.  Marland Kitchen albuterol (PROVENTIL HFA;VENTOLIN HFA) 108 (90 Base) MCG/ACT inhaler Inhale 2 puffs into the lungs every 6 (six) hours as needed for wheezing or shortness of breath.  Marland Kitchen aspirin EC 81 MG EC tablet Take 1 tablet (81 mg total) by mouth daily.  Marland Kitchen atorvastatin (LIPITOR) 40 MG tablet TAKE 1 TABLET DAILY (Patient taking differently: Take 40 mg by mouth daily. )  . Cholecalciferol (VITAMIN D3) 5000 units TABS Take 1 tablet by mouth daily.  . clopidogrel (PLAVIX) 75 MG tablet TAKE 1 TABLET DAILY (Patient taking differently: Take 75 mg by mouth once. )  . furosemide (LASIX) 20 MG tablet TAKE 1 TABLET DAILY (Patient taking differently: Take 20 mg by mouth daily. )  . levothyroxine (SYNTHROID, LEVOTHROID) 100 MCG tablet TAKE (1) TABLET DAILY BE- FORE BREAKFAST. (Patient taking differently: Take 100 mcg by mouth daily. )  . losartan (COZAAR) 50 MG tablet Take 1 tablet (50 mg total) by mouth daily.  . metFORMIN (GLUCOPHAGE) 500 MG tablet Take 1 tablet (500 mg total) by mouth 2 (two) times daily with a meal.  . methocarbamol (ROBAXIN) 500 MG tablet Take 1 tablet (500 mg total) by mouth every 6 (six) hours as needed for muscle spasms.  Marland Kitchen omeprazole (PRILOSEC) 20 MG capsule TAKE (1) CAPSULE DAILY (Patient taking differently: Take 20 mg by mouth daily. )  . ONE  TOUCH ULTRA TEST test strip TEST BLOOD SUGARS TWICE A DAY  . polyethylene glycol (MIRALAX / GLYCOLAX) packet Take 17 g by mouth daily as needed for moderate constipation.  . senna (SENOKOT) 8.6 MG TABS tablet Take 1 tablet (8.6 mg total) by mouth daily.  Marland Kitchen umeclidinium bromide (INCRUSE ELLIPTA) 62.5 MCG/INH AEPB Inhale 1 puff into the lungs daily.  . [DISCONTINUED] enoxaparin (LOVENOX) 40 MG/0.4ML injection Inject 0.4 mLs (40 mg total) into the skin daily.  . [DISCONTINUED] nicotine (NICODERM CQ - DOSED IN MG/24 HOURS) 21 mg/24hr patch Place  1 patch (21 mg total) onto the skin daily.  Marland Kitchen apixaban (ELIQUIS) 2.5 MG TABS tablet Take 1 tablet (2.5 mg total) by mouth 2 (two) times daily for 30 days.  . Cetirizine HCl 10 MG TBDP Take 10 mg by mouth daily.  . famotidine (PEPCID) 20 MG tablet Take 1 tablet (20 mg total) by mouth 2 (two) times daily for 14 days.  . nicotine (NICOTINE STEP 1) 21 mg/24hr patch Place 1 patch (21 mg total) onto the skin daily.  Marland Kitchen triamcinolone cream (KENALOG) 0.1 % Apply 1 application topically 2 (two) times daily.  . [DISCONTINUED] warfarin (COUMADIN) 5 MG tablet Take 1.5 tablets (7.5 mg total) by mouth daily at 6 PM for 1 dose.   No facility-administered encounter medications on file as of 07/19/2018.     Allergies  Allergen Reactions  . Aleve [Naproxen Sodium] Hives and Itching  . Cortisone Swelling    Internal organs  . Prednisone Swelling    Makes internal organs swell  . Vancomycin Itching    Review of Systems  Constitutional: Positive for activity change. Negative for chills, fatigue and fever.  Respiratory: Negative for cough, choking, chest tightness, shortness of breath and wheezing.   Cardiovascular: Negative for chest pain, palpitations and leg swelling.  Gastrointestinal: Negative for blood in stool.  Endocrine: Negative for polydipsia, polyphagia and polyuria.  Skin: Positive for rash (neck and chin, pruritic).  Neurological: Negative for dizziness,  tremors, seizures, syncope, facial asymmetry, speech difficulty, weakness, light-headedness, numbness and headaches.  Hematological: Negative for adenopathy. Does not bruise/bleed easily.  Psychiatric/Behavioral: Negative for confusion. The patient is nervous/anxious.   All other systems reviewed and are negative.       Objective:  BP 116/80   Pulse 91   Temp (!) 97.1 F (36.2 C) (Oral)    Wt Readings from Last 3 Encounters:  07/14/18 230 lb 13.2 oz (104.7 kg)  07/04/18 231 lb 0.7 oz (104.8 kg)  07/04/18 231 lb (104.8 kg)    Physical Exam Vitals signs and nursing note reviewed.  Constitutional:      General: She is not in acute distress.    Appearance: She is obese. She is not ill-appearing or toxic-appearing.  HENT:     Head: Normocephalic and atraumatic.     Right Ear: Tympanic membrane, ear canal and external ear normal.     Left Ear: Tympanic membrane, ear canal and external ear normal.     Nose: Nose normal.     Mouth/Throat:     Mouth: Mucous membranes are moist.     Pharynx: Oropharynx is clear.  Eyes:     Conjunctiva/sclera: Conjunctivae normal.     Pupils: Pupils are equal, round, and reactive to light.  Cardiovascular:     Rate and Rhythm: Normal rate and regular rhythm.     Heart sounds: Normal heart sounds. No murmur. No friction rub. No gallop.   Pulmonary:     Effort: Pulmonary effort is normal. No respiratory distress.     Breath sounds: Normal breath sounds.  Musculoskeletal:     Comments: Right BKA. Healing surgical wound without erythema or drainage. Stocking in place, clean and dry.   Skin:    General: Skin is warm.     Capillary Refill: Capillary refill takes less than 2 seconds.     Findings: Rash present.       Neurological:     General: No focal deficit present.     Mental Status: She is alert and oriented  to person, place, and time.  Psychiatric:        Mood and Affect: Mood normal.        Behavior: Behavior normal. Behavior is  cooperative.        Thought Content: Thought content normal.        Judgment: Judgment normal.     Results for orders placed or performed during the hospital encounter of 07/11/18  CBC WITH DIFFERENTIAL  Result Value Ref Range   WBC 13.1 (H) 4.0 - 10.5 K/uL   RBC 4.12 3.87 - 5.11 MIL/uL   Hemoglobin 11.9 (L) 12.0 - 15.0 g/dL   HCT 36.8 36.0 - 46.0 %   MCV 89.3 80.0 - 100.0 fL   MCH 28.9 26.0 - 34.0 pg   MCHC 32.3 30.0 - 36.0 g/dL   RDW 13.5 11.5 - 15.5 %   Platelets 221 150 - 400 K/uL   nRBC 0.0 0.0 - 0.2 %   Neutrophils Relative % 58 %   Neutro Abs 7.8 (H) 1.7 - 7.7 K/uL   Lymphocytes Relative 28 %   Lymphs Abs 3.6 0.7 - 4.0 K/uL   Monocytes Relative 8 %   Monocytes Absolute 1.0 0.1 - 1.0 K/uL   Eosinophils Relative 4 %   Eosinophils Absolute 0.5 0.0 - 0.5 K/uL   Basophils Relative 1 %   Basophils Absolute 0.1 0.0 - 0.1 K/uL   Immature Granulocytes 1 %   Abs Immature Granulocytes 0.12 (H) 0.00 - 0.07 K/uL  Comprehensive metabolic panel  Result Value Ref Range   Sodium 138 135 - 145 mmol/L   Potassium 4.2 3.5 - 5.1 mmol/L   Chloride 101 98 - 111 mmol/L   CO2 27 22 - 32 mmol/L   Glucose, Bld 257 (H) 70 - 99 mg/dL   BUN 13 6 - 20 mg/dL   Creatinine, Ser 0.89 0.44 - 1.00 mg/dL   Calcium 8.7 (L) 8.9 - 10.3 mg/dL   Total Protein 6.7 6.5 - 8.1 g/dL   Albumin 2.8 (L) 3.5 - 5.0 g/dL   AST 43 (H) 15 - 41 U/L   ALT 24 0 - 44 U/L   Alkaline Phosphatase 39 38 - 126 U/L   Total Bilirubin 0.3 0.3 - 1.2 mg/dL   GFR calc non Af Amer >60 >60 mL/min   GFR calc Af Amer >60 >60 mL/min   Anion gap 10 5 - 15  Protime-INR  Result Value Ref Range   Prothrombin Time 13.7 11.4 - 15.2 seconds   INR 1.06   Glucose, capillary  Result Value Ref Range   Glucose-Capillary 213 (H) 70 - 99 mg/dL   Comment 1 Notify RN   Glucose, capillary  Result Value Ref Range   Glucose-Capillary 183 (H) 70 - 99 mg/dL  Glucose, capillary  Result Value Ref Range   Glucose-Capillary 147 (H) 70 - 99 mg/dL     Comment 1 Notify RN   Glucose, capillary  Result Value Ref Range   Glucose-Capillary 145 (H) 70 - 99 mg/dL   Comment 1 Notify RN   Glucose, capillary  Result Value Ref Range   Glucose-Capillary 196 (H) 70 - 99 mg/dL  Glucose, capillary  Result Value Ref Range   Glucose-Capillary 151 (H) 70 - 99 mg/dL  Glucose, capillary  Result Value Ref Range   Glucose-Capillary 150 (H) 70 - 99 mg/dL  Glucose, capillary  Result Value Ref Range   Glucose-Capillary 165 (H) 70 - 99 mg/dL  Protime-INR  Result Value Ref Range  Prothrombin Time 13.2 11.4 - 15.2 seconds   INR 1.01   Glucose, capillary  Result Value Ref Range   Glucose-Capillary 259 (H) 70 - 99 mg/dL  Glucose, capillary  Result Value Ref Range   Glucose-Capillary 151 (H) 70 - 99 mg/dL  Glucose, capillary  Result Value Ref Range   Glucose-Capillary 139 (H) 70 - 99 mg/dL  Glucose, capillary  Result Value Ref Range   Glucose-Capillary 181 (H) 70 - 99 mg/dL  Protime-INR  Result Value Ref Range   Prothrombin Time 13.7 11.4 - 15.2 seconds   INR 1.06   CBC with Differential/Platelet  Result Value Ref Range   WBC 12.4 (H) 4.0 - 10.5 K/uL   RBC 4.43 3.87 - 5.11 MIL/uL   Hemoglobin 12.8 12.0 - 15.0 g/dL   HCT 39.4 36.0 - 46.0 %   MCV 88.9 80.0 - 100.0 fL   MCH 28.9 26.0 - 34.0 pg   MCHC 32.5 30.0 - 36.0 g/dL   RDW 13.6 11.5 - 15.5 %   Platelets 281 150 - 400 K/uL   nRBC 0.0 0.0 - 0.2 %   Neutrophils Relative % 59 %   Neutro Abs 7.4 1.7 - 7.7 K/uL   Lymphocytes Relative 29 %   Lymphs Abs 3.6 0.7 - 4.0 K/uL   Monocytes Relative 6 %   Monocytes Absolute 0.7 0.1 - 1.0 K/uL   Eosinophils Relative 4 %   Eosinophils Absolute 0.5 0.0 - 0.5 K/uL   Basophils Relative 1 %   Basophils Absolute 0.1 0.0 - 0.1 K/uL   Immature Granulocytes 1 %   Abs Immature Granulocytes 0.06 0.00 - 0.07 K/uL  Glucose, capillary  Result Value Ref Range   Glucose-Capillary 188 (H) 70 - 99 mg/dL  Glucose, capillary  Result Value Ref Range    Glucose-Capillary 176 (H) 70 - 99 mg/dL  Glucose, capillary  Result Value Ref Range   Glucose-Capillary 103 (H) 70 - 99 mg/dL  Glucose, capillary  Result Value Ref Range   Glucose-Capillary 194 (H) 70 - 99 mg/dL  Protime-INR  Result Value Ref Range   Prothrombin Time 14.4 11.4 - 15.2 seconds   INR 1.12   Glucose, capillary  Result Value Ref Range   Glucose-Capillary 177 (H) 70 - 99 mg/dL  Glucose, capillary  Result Value Ref Range   Glucose-Capillary 176 (H) 70 - 99 mg/dL  Glucose, capillary  Result Value Ref Range   Glucose-Capillary 118 (H) 70 - 99 mg/dL       Pertinent labs & imaging results that were available during my care of the patient were reviewed by me and considered in my medical decision making.  Assessment & Plan:  Madolyn was seen today for hospitalization follow-up.  Diagnoses and all orders for this visit:  Peripheral vascular insufficiency ([place]) Will transition to Eliquis tomorrow. Stop coumadin and lovenox today. Will recheck INR in one week. Report any abnormal bleeding or bruising.  -     CoaguChek XS/INR Waived -     apixaban (ELIQUIS) 2.5 MG TABS tablet; Take 1 tablet (2.5 mg total) by mouth 2 (two) times daily for 30 days.  Unilateral complete BKA, right, initial encounter [facility]) Doing well with PT and wound care.   Subtherapeutic international normalized ratio (INR) INR 1.4 in office today. Will stop coumadin and lovenox and start Eliquis  2.5 mg twice daily tomorrow. Will follow up in one week for repeat INR.   Rash No known triggers. Atarax helps with pruritis but rash remains.  Zyrtec, Pepcid, and Kenalog cream as prescribed. Scraped for fungal culture and swabbed for bacterial culture. Cultures pending.  -     WOUND CULTURE -     triamcinolone cream (KENALOG) 0.1 %; Apply 1 application topically 2 (two) times daily. -     famotidine (PEPCID) 20 MG tablet; Take 1 tablet (20 mg total) by mouth 2 (two) times daily for 14 days. -     Fungus culture,  skin -     Cetirizine HCl 10 MG TBDP; Take 10 mg by mouth daily.  Cigarette nicotine dependence with withdrawal Has not had a cigarette since discharge from hospital. Having significant cravings. Will order nicotine patches today.  -     nicotine (NICOTINE STEP 1) 21 mg/24hr patch; Place 1 patch (21 mg total) onto the skin daily.     Continue all other maintenance medications.  Follow up plan: Return in about 1 week (around 07/26/2018), or if symptoms worsen or fail to improve.  Educational handout given for Apixaban  The above assessment and management plan was discussed with the patient. The patient verbalized understanding of and has agreed to the management plan. Patient is aware to call the clinic if symptoms persist or worsen. Patient is aware when to return to the clinic for a follow-up visit. Patient educated on when it is appropriate to go to the emergency department.   Monia Pouch, FNP-C [facility] [phone]

## 2018-07-19 NOTE — Telephone Encounter (Signed)
Patient says that the $10 co-pay card will not work with Medicare and she will have to stay on Coumadin.

## 2018-07-19 NOTE — Patient Instructions (Signed)
STOP COUMADIN AND LOVENOX  Apixaban oral tablets What is this medicine? APIXABAN (a PIX a ban) is an anticoagulant (blood thinner). It is used to lower the chance of stroke in people with a medical condition called atrial fibrillation. It is also used to treat or prevent blood clots in the lungs or in the veins. This medicine may be used for other purposes; ask your health care provider or pharmacist if you have questions. COMMON BRAND NAME(S): Eliquis What should I tell my health care provider before I take this medicine? They need to know if you have any of these conditions: -bleeding disorders -bleeding in the brain -blood in your stools (black or tarry stools) or if you have blood in your vomit -history of stomach bleeding -kidney disease -liver disease -mechanical heart valve -an unusual or allergic reaction to apixaban, other medicines, foods, dyes, or preservatives -pregnant or trying to get pregnant -breast-feeding How should I use this medicine? Take this medicine by mouth with a glass of water. Follow the directions on the prescription label. You can take it with or without food. If it upsets your stomach, take it with food. Take your medicine at regular intervals. Do not take it more often than directed. Do not stop taking except on your doctor's advice. Stopping this medicine may increase your risk of a blood clot. Be sure to refill your prescription before you run out of medicine. Talk to your pediatrician regarding the use of this medicine in children. Special care may be needed. Overdosage: If you think you have taken too much of this medicine contact a poison control center or emergency room at once. NOTE: This medicine is only for you. Do not share this medicine with others. What if I miss a dose? If you miss a dose, take it as soon as you can. If it is almost time for your next dose, take only that dose. Do not take double or extra doses. What may interact with this  medicine? This medicine may interact with the following: -aspirin and aspirin-like medicines -certain medicines for fungal infections like ketoconazole and itraconazole -certain medicines for seizures like carbamazepine and phenytoin -certain medicines that treat or prevent blood clots like warfarin, enoxaparin, and dalteparin -clarithromycin -NSAIDs, medicines for pain and inflammation, like ibuprofen or naproxen -rifampin -ritonavir -St. John's wort This list may not describe all possible interactions. Give your health care provider a list of all the medicines, herbs, non-prescription drugs, or dietary supplements you use. Also tell them if you smoke, drink alcohol, or use illegal drugs. Some items may interact with your medicine. What should I watch for while using this medicine? Visit your healthcare professional for regular checks on your progress. You may need blood work done while you are taking this medicine. Your condition will be monitored carefully while you are receiving this medicine. It is important not to miss any appointments. Avoid sports and activities that might cause injury while you are using this medicine. Severe falls or injuries can cause unseen bleeding. Be careful when using sharp tools or knives. Consider using an Copy. Take special care brushing or flossing your teeth. Report any injuries, bruising, or red spots on the skin to your healthcare professional. If you are going to need surgery or other procedure, tell your healthcare professional that you are taking this medicine. Wear a medical ID bracelet or chain. Carry a card that describes your disease and details of your medicine and dosage times. What side effects may I notice  from receiving this medicine? Side effects that you should report to your doctor or health care professional as soon as possible: -allergic reactions like skin rash, itching or hives, swelling of the face, lips, or tongue -signs and  symptoms of bleeding such as bloody or black, tarry stools; red or dark-brown urine; spitting up blood or brown material that looks like coffee grounds; red spots on the skin; unusual bruising or bleeding from the eye, gums, or nose -signs and symptoms of a blood clot such as chest pain; shortness of breath; pain, swelling, or warmth in the leg -signs and symptoms of a stroke such as changes in vision; confusion; trouble speaking or understanding; severe headaches; sudden numbness or weakness of the face, arm or leg; trouble walking; dizziness; loss of coordination This list may not describe all possible side effects. Call your doctor for medical advice about side effects. You may report side effects to FDA at 1-800-FDA-1088. Where should I keep my medicine? Keep out of the reach of children. Store at room temperature between 20 and 25 degrees C (68 and 77 degrees F). Throw away any unused medicine after the expiration date. NOTE: This sheet is a summary. It may not cover all possible information. If you have questions about this medicine, talk to your doctor, pharmacist, or health care provider.  2019 Elsevier/Gold Standard (2017-05-20 11:20:07)

## 2018-07-20 NOTE — Telephone Encounter (Signed)
Keep patient on Coumadin.  Just remind her that she needs to come in and be checked regularly or that we cannot prescribe this medicine unless she gets protimes.

## 2018-07-20 NOTE — Telephone Encounter (Signed)
Patient will continue taking coumadin 7.5mg  until appt with Dr. Laurance Flatten next week

## 2018-07-21 ENCOUNTER — Telehealth: Payer: Self-pay | Admitting: *Deleted

## 2018-07-21 LAB — WOUND CULTURE: Organism ID, Bacteria: NONE SEEN

## 2018-07-21 NOTE — Telephone Encounter (Signed)
Patient aware of Rakes recommendations

## 2018-07-26 ENCOUNTER — Telehealth: Payer: Self-pay | Admitting: Family Medicine

## 2018-07-26 ENCOUNTER — Ambulatory Visit (INDEPENDENT_AMBULATORY_CARE_PROVIDER_SITE_OTHER): Payer: Medicare Other | Admitting: Physician Assistant

## 2018-07-26 ENCOUNTER — Encounter (INDEPENDENT_AMBULATORY_CARE_PROVIDER_SITE_OTHER): Payer: Self-pay | Admitting: Orthopedic Surgery

## 2018-07-26 VITALS — Ht 70.0 in | Wt 230.0 lb

## 2018-07-26 DIAGNOSIS — S88111D Complete traumatic amputation at level between knee and ankle, right lower leg, subsequent encounter: Secondary | ICD-10-CM

## 2018-07-26 DIAGNOSIS — E1142 Type 2 diabetes mellitus with diabetic polyneuropathy: Secondary | ICD-10-CM

## 2018-07-26 DIAGNOSIS — E440 Moderate protein-calorie malnutrition: Secondary | ICD-10-CM

## 2018-07-26 DIAGNOSIS — I82403 Acute embolism and thrombosis of unspecified deep veins of lower extremity, bilateral: Secondary | ICD-10-CM

## 2018-07-26 DIAGNOSIS — I739 Peripheral vascular disease, unspecified: Secondary | ICD-10-CM

## 2018-07-26 NOTE — Telephone Encounter (Signed)
Aware. 

## 2018-07-26 NOTE — Telephone Encounter (Signed)
The bacterial culture was negative, I am waiting on the fungal infection.

## 2018-07-26 NOTE — Telephone Encounter (Signed)
Please review and advise.

## 2018-07-27 ENCOUNTER — Encounter: Payer: Medicare Other | Admitting: Registered Nurse

## 2018-07-27 ENCOUNTER — Encounter: Payer: Self-pay | Admitting: Family Medicine

## 2018-07-27 ENCOUNTER — Ambulatory Visit (INDEPENDENT_AMBULATORY_CARE_PROVIDER_SITE_OTHER): Payer: Medicare Other | Admitting: Family Medicine

## 2018-07-27 VITALS — BP 107/71 | HR 79 | Temp 96.9°F | Ht 70.0 in | Wt 230.0 lb

## 2018-07-27 DIAGNOSIS — I739 Peripheral vascular disease, unspecified: Secondary | ICD-10-CM | POA: Diagnosis not present

## 2018-07-27 DIAGNOSIS — I82403 Acute embolism and thrombosis of unspecified deep veins of lower extremity, bilateral: Secondary | ICD-10-CM

## 2018-07-27 DIAGNOSIS — S88111A Complete traumatic amputation at level between knee and ankle, right lower leg, initial encounter: Secondary | ICD-10-CM

## 2018-07-27 DIAGNOSIS — E1152 Type 2 diabetes mellitus with diabetic peripheral angiopathy with gangrene: Secondary | ICD-10-CM

## 2018-07-27 LAB — COAGUCHEK XS/INR WAIVED
INR: 1.4 — AB (ref 0.9–1.1)
Prothrombin Time: 16.8 s

## 2018-07-27 MED ORDER — TRETINOIN 0.05 % EX CREA: TOPICAL_CREAM | Freq: Every day | CUTANEOUS | 3 refills | Status: DC

## 2018-07-27 MED ORDER — WARFARIN SODIUM 5 MG PO TABS: 5.0000 mg | ORAL_TABLET | Freq: Every day | ORAL | 1 refills | Status: DC

## 2018-07-27 NOTE — Patient Instructions (Signed)
Follow-up with orthopedist and prosthetic group as recommended Get pro time in 1 week Increase Coumadin to 7-1/2 mg on Monday Wednesday and Friday and 5 mg on all other days. Continue to be careful and not put herself at risk for falling until she gets her prosthesis in place.

## 2018-07-27 NOTE — Progress Notes (Signed)
Subjective:    Patient ID: [name], female    DOB: 02-21-1961, 58 y.o.   MRN: [id number]  HPI Patient here today for follow up on Protime / INR and medications.  Patient comes in with her daughter to get her INR checked.  She is doing great she has an appointment this coming Tuesday at the orthopedist office, Dr. Pearla Dubonnet.  A prosthetic person will meet her there and they are working on getting her leg so she could wear a prosthesis below the right knee.  She is looking good smiling and feeling great and is positive about moving forward.  She also understands that for [place] to prescribe Coumadin that she will have to come in here regularly [place] her INR.  She cannot afford to get the Xarelto.  The co-pay card does not work for her because she has Medicare.  Her INR today was 1.4.  She has been taking 5 mg daily except she took 7-1/2 mg for 2 days in a row last week.  We will go ahead and have her increase her Coumadin and take 7-1/2 mg on Monday Wednesday and Friday and 5 mg on all other days.  She will return to this office in 1 week for repeat pro time.  Patient Active Problem List   Diagnosis Date Noted  . Unilateral complete BKA, right, subsequent encounter ([place])   . S/P unilateral BKA (below knee amputation), right ([place])   . Subtherapeutic international normalized ratio (INR)   . Noncompliance   . Leukocytosis   . Acute blood loss anemia   . Diabetes mellitus type 2 in obese ([place])   . Unilateral complete BKA, right, initial encounter ([place]) 07/11/2018  . Acquired absence of right leg below knee ([place]) 07/08/2018  . Mild protein-calorie malnutrition ([place])   . Osteomyelitis of great toe of right foot ([place]) 07/04/2018  . Intermittent claudication ([place]) 05/11/2017  . Deep vein thrombosis (DVT) of both lower extremities ([place]) 01/05/2017  . Peripheral vascular insufficiency ([place]) 09/25/2015  . Hypothyroidism  01/29/2014  . Obesity (BMI 30-39.9) 08/16/2013  . Impetigo 07/28/2013  . Chronic obstructive pulmonary disease ([place])   . Malignant neoplasm of uterus ([place])   . GERD (gastroesophageal reflux disease) 10/18/2012  . Hernia, hiatal 10/18/2012  . Hyperlipidemia 04/18/2008  . SMOKER 04/18/2008  . Essential hypertension 04/18/2008   Outpatient Encounter Medications as of 07/27/2018  Medication Sig  . acetaminophen (TYLENOL) 325 MG tablet Take 1-2 tablets (325-650 mg total) by mouth every 4 (four) hours as needed for mild pain.  Marland Kitchen albuterol (PROVENTIL HFA;VENTOLIN HFA) 108 (90 Base) MCG/ACT inhaler Inhale 2 puffs into the lungs every 6 (six) hours as needed for wheezing or shortness of breath.  Marland Kitchen aspirin EC 81 MG EC tablet Take 1 tablet (81 mg total) by mouth daily.  Marland Kitchen atorvastatin (LIPITOR) 40 MG tablet TAKE 1 TABLET DAILY (Patient taking differently: Take 40 mg by mouth daily. )  . Cholecalciferol (VITAMIN D3) 5000 units TABS Take 1 tablet by mouth daily.  . clopidogrel (PLAVIX) 75 MG tablet TAKE 1 TABLET DAILY (Patient taking differently: Take 75 mg by mouth once. )  . furosemide (LASIX) 20 MG tablet TAKE 1 TABLET DAILY (Patient taking differently: Take 20 mg by mouth daily. )  . levothyroxine (SYNTHROID, LEVOTHROID) 100 MCG tablet TAKE (1) TABLET DAILY BE- FORE BREAKFAST. (Patient taking differently: Take 100 mcg by mouth daily. )  .  losartan (COZAAR) 50 MG tablet Take 1 tablet (50 mg total) by mouth daily.  . metFORMIN (GLUCOPHAGE) 500 MG tablet Take 1 tablet (500 mg total) by mouth 2 (two) times daily with a meal.  . methocarbamol (ROBAXIN) 500 MG tablet Take 1 tablet (500 mg total) by mouth every 6 (six) hours as needed for muscle spasms.  Marland Kitchen omeprazole (PRILOSEC) 20 MG capsule TAKE (1) CAPSULE DAILY (Patient taking differently: Take 20 mg by mouth daily. )  . ONE TOUCH ULTRA TEST test strip TEST BLOOD SUGARS TWICE A DAY  . triamcinolone cream (KENALOG) 0.1 % Apply 1 application topically 2  (two) times daily.  Marland Kitchen warfarin (COUMADIN) 5 MG tablet Take 1 tablet (5 mg total) by mouth daily at 6 PM for 1 dose.  . [DISCONTINUED] apixaban (ELIQUIS) 2.5 MG TABS tablet Take 1 tablet (2.5 mg total) by mouth 2 (two) times daily for 30 days.  . [DISCONTINUED] Cetirizine HCl 10 MG TBDP Take 10 mg by mouth daily.  . [DISCONTINUED] famotidine (PEPCID) 20 MG tablet Take 1 tablet (20 mg total) by mouth 2 (two) times daily for 14 days.  . [DISCONTINUED] nicotine (NICOTINE STEP 1) 21 mg/24hr patch Place 1 patch (21 mg total) onto the skin daily.  . [DISCONTINUED] polyethylene glycol (MIRALAX / GLYCOLAX) packet Take 17 g by mouth daily as needed for moderate constipation.  . [DISCONTINUED] senna (SENOKOT) 8.6 MG TABS tablet Take 1 tablet (8.6 mg total) by mouth daily.  . [DISCONTINUED] umeclidinium bromide (INCRUSE ELLIPTA) 62.5 MCG/INH AEPB Inhale 1 puff into the lungs daily.   No facility-administered encounter medications on file as of 07/27/2018.       Review of Systems  Constitutional: Negative.   HENT: Negative.   Eyes: Negative.   Respiratory: Negative.   Cardiovascular: Negative.   Gastrointestinal: Negative.   Endocrine: Negative.   Genitourinary: Negative.   Musculoskeletal: Negative.   Skin: Positive for rash (recent ).  Allergic/Immunologic: Negative.   Neurological: Negative.   Hematological: Negative.   Psychiatric/Behavioral: Negative.        Objective:   Physical Exam Vitals signs and nursing note reviewed.  Constitutional:      Appearance: Normal appearance.  HENT:     Head: Normocephalic and atraumatic.     Nose: Nose normal.  Eyes:     General: No scleral icterus.       Right eye: No discharge.        Left eye: No discharge.     Extraocular Movements: Extraocular movements intact.     Conjunctiva/sclera: Conjunctivae normal.     Pupils: Pupils are equal, round, and reactive to light.  Neck:     Musculoskeletal: Normal range of motion.  Musculoskeletal:  Normal range of motion.     Comments: Patient has had a right below the knee amputation and has a dressing on this extremity currently and will be seeing orthopedist next Tuesday along with a prosthetic company that is helping to adjust her prosthesis.  She is currently getting around in the wheelchair and is independent as much or more than would be expected considering what she has been through.  Skin:    General: Skin is warm and dry.     Findings: No rash.  Neurological:     General: No focal deficit present.     Mental Status: She is alert and oriented to person, place, and time.  Psychiatric:        Mood and Affect: Mood normal.  Behavior: Behavior normal.        Thought Content: Thought content normal.        Judgment: Judgment normal.    BP 107/71 (BP Location: Left Arm)   Pulse 79   Temp (!) 96.9 F (36.1 C) (Oral)   Ht 5\' 10"  (1.778 m)   Wt 230 lb (104.3 kg)   BMI 33.00 kg/m         Assessment & Plan:  1. Peripheral vascular insufficiency (HCC) -Increase Coumadin as directed 7-1/2 mg Monday Wednesday and Friday and 5 mg all other days - CoaguChek XS/INR Waived  2. Deep vein thrombosis (DVT) of both lower extremities, unspecified chronicity, unspecified vein (HCC) = Continue Coumadin indefinitely but get protimes checked regularly - CoaguChek XS/INR Waived  3. Unilateral complete BKA, right, initial encounter [facility]) -Follow-up with orthopedist as planned  4. Type 2 diabetes mellitus with diabetic peripheral angiopathy and gangrene, without long-term current use of insulin (HCC) -Continue to check blood sugars regularly  Meds ordered this encounter  Medications  . warfarin (COUMADIN) 5 MG tablet    Sig: Take 1 tablet (5 mg total) by mouth daily at 6 PM for 1 dose.    Dispense:  90 tablet    Refill:  1  . tretinoin (RETIN-A) 0.05 % cream    Sig: Apply topically at bedtime.    Dispense:  45 g    Refill:  3    Do not fill - pt will let you know    Patient Instructions  Follow-up with orthopedist and prosthetic group as recommended Get pro time in 1 week Increase Coumadin to 7-1/2 mg on Monday Wednesday and Friday and 5 mg on all other days. Continue to be careful and not put herself at risk for falling until she gets her prosthesis in place.  Arrie Senate MD

## 2018-07-29 ENCOUNTER — Encounter (INDEPENDENT_AMBULATORY_CARE_PROVIDER_SITE_OTHER): Payer: Self-pay | Admitting: Physician Assistant

## 2018-07-29 NOTE — Progress Notes (Signed)
Office Visit Note   Patient: [name]           Date of Birth: 1960/09/05           MRN: [id number] Visit Date: 07/26/2018              Requested by: Chipper Herb, MD [address] [place], [place] [address] PCP: Chipper Herb, MD  Chief Complaint  Patient presents with  . Right Leg - Routine Post Op    07/08/2018 right BKA       HPI: The patient is a 58 year old woman with diabetic insensate neuropathy and severe peripheral vascular disease who underwent a right transtibial amputation on 07/08/2018 due to osteomyelitis of the right great toe.  She has had multiple surgical interventions for vascular disease by Dr. Donnetta Hutching in the past.  She had previously undergone amputation of toes 4 and 5 on the right foot. She presents for her first postoperative visit.  She went to [facility] inpatient rehabilitation postoperatively and her [facility] was removed while there.  She is working with [employer] clinic and is wearing a 3 XL shrinker stocking.  She reports her pain is under good control and she is wearing her shrinker stocking at all times. Assessment & Plan: Visit Diagnoses:  1. Unilateral complete BKA, right, subsequent encounter ([place])   2. Type 2 diabetes mellitus with diabetic polyneuropathy, without long-term current use of insulin ([place])   3. Peripheral vascular insufficiency ([place])   4. Deep vein thrombosis (DVT) of both lower extremities, unspecified chronicity, unspecified vein (HCC)   5. Moderate protein-calorie malnutrition ([place])     Plan: Continue stump shrinker stocking but decreased to 2 XL size.  Continue stump protector.  She will follow-up here in 1 week possibly for staple removal at that time or sooner should she have difficulties in the interim.  Follow-Up Instructions: Return in about 1 week (around 08/02/2018).   Ortho Exam  Patient is alert, oriented, no adenopathy, well-dressed, normal affect, normal respiratory effort. The right transtibial  amputation site is healing well with scant serosanguineous drainage.  There are no signs of infection or cellulitis.  She has full knee extension and good flexion.  Imaging: No results found.   Labs: Lab Results  Component Value Date   HGBA1C 8.8 (H) 07/04/2018   HGBA1C 8.4 (H) 03/15/2018   HGBA1C 7.6 (H) 09/10/2017   ESRSEDRATE 19 10/25/2012   CRP 0.6 10/25/2012   LABURIC 5.7 12/10/2006   REPTSTATUS 07/09/2018 FINAL 07/04/2018   CULT NO GROWTH 5 DAYS 07/04/2018     Lab Results  Component Value Date   ALBUMIN 2.8 (L) 07/12/2018   ALBUMIN 3.2 (L) 07/04/2018   ALBUMIN 3.5 07/04/2018   LABURIC 5.7 12/10/2006    Body mass index is 33 kg/m.  Orders:  No orders of the defined types were placed in this encounter.  No orders of the defined types were placed in this encounter.    Procedures: No procedures performed  Clinical Data: No additional findings.  ROS:  All other systems negative, except as noted in the HPI. Review of Systems  Objective: Vital Signs: Ht 5\' 10"  (1.778 m)   Wt 230 lb (104.3 kg)   BMI 33.00 kg/m   Specialty Comments:  No specialty comments available.  PMFS History: Patient Active Problem List   Diagnosis Date Noted  . Unilateral complete BKA, right, subsequent encounter ([place])   . S/P unilateral BKA (below knee amputation), right ([place])   .  Subtherapeutic international normalized ratio (INR)   . Noncompliance   . Leukocytosis   . Acute blood loss anemia   . Diabetes mellitus type 2 in obese ([place])   . Unilateral complete BKA, right, initial encounter ([place]) 07/11/2018  . Acquired absence of right leg below knee ([place]) 07/08/2018  . Mild protein-calorie malnutrition ([place])   . Osteomyelitis of great toe of right foot ([place]) 07/04/2018  . Intermittent claudication ([place]) 05/11/2017  . Deep vein thrombosis (DVT) of both lower extremities ([place]) 01/05/2017  . Peripheral vascular insufficiency ([place]) 09/25/2015  . Hypothyroidism 01/29/2014  .  Obesity (BMI 30-39.9) 08/16/2013  . Impetigo 07/28/2013  . Chronic obstructive pulmonary disease ([place])   . Malignant neoplasm of uterus ([place])   . GERD (gastroesophageal reflux disease) 10/18/2012  . Hernia, hiatal 10/18/2012  . Hyperlipidemia 04/18/2008  . SMOKER 04/18/2008  . Essential hypertension 04/18/2008   Past Medical History:  Diagnosis Date  . COPD (chronic obstructive pulmonary disease) ([place])   . Depression   . DM (diabetes mellitus) ([place])   . DVT (deep venous thrombosis) (HCC)    x5  . Family history of colon cancer   . GERD (gastroesophageal reflux disease)   . Hyperlipemia   . Hypertension   . Hypothyroid   . Obesity   . PVD (peripheral vascular disease) ([place])   . Uterine cancer ([place])   . UTI (urinary tract infection)     Family History  Problem Relation Age of Onset  . Colon cancer Brother 72  . Hypertension Mother   . Hypothyroidism Mother   . Heart disease Father   . Diabetes Father   . Hyperlipidemia Father   . Clotting disorder Brother   . Diabetes Brother   . Heart disease Brother     Past Surgical History:  Procedure Laterality Date  . ABDOMINAL HYSTERECTOMY    . AMPUTATION Right 07/08/2018   Procedure: RIGHT BELOW KNEE AMPUTATION;  Surgeon: [name], MD;  Location: [facility];  Service: Orthopedics;  Laterality: Right;  . CESAREAN SECTION    . FEMORAL BYPASS     x 5  . LUMBAR DISC SURGERY     L4-L5  . TOE AMPUTATION     right  . TONSILLECTOMY     Social History   Occupational History  . Occupation: Product manager: [employer]  Tobacco Use  . Smoking status: Current Every Day Smoker    Packs/day: 0.50    Years: 38.00    Pack years: 19.00    Types: Cigarettes  . Smokeless tobacco: Never Used  Substance and Sexual Activity  . Alcohol use: No    Alcohol/week: 0.0 standard drinks  . Drug use: No  . Sexual activity: Not on file

## 2018-08-02 ENCOUNTER — Encounter (INDEPENDENT_AMBULATORY_CARE_PROVIDER_SITE_OTHER): Payer: Self-pay | Admitting: Orthopedic Surgery

## 2018-08-02 ENCOUNTER — Ambulatory Visit (INDEPENDENT_AMBULATORY_CARE_PROVIDER_SITE_OTHER): Payer: Medicare Other | Admitting: Orthopedic Surgery

## 2018-08-02 VITALS — Ht 70.0 in | Wt 230.0 lb

## 2018-08-02 DIAGNOSIS — E1142 Type 2 diabetes mellitus with diabetic polyneuropathy: Secondary | ICD-10-CM

## 2018-08-02 DIAGNOSIS — S88111D Complete traumatic amputation at level between knee and ankle, right lower leg, subsequent encounter: Secondary | ICD-10-CM

## 2018-08-02 NOTE — Progress Notes (Signed)
Office Visit Note   Patient: [name]           Date of Birth: [birth year]           MRN: [id number] Visit Date: 08/02/2018              Requested by: Chipper Herb, MD [address] [place], [place] [address] PCP: Chipper Herb, MD  Chief Complaint  Patient presents with  . Right Leg - Follow-up      HPI: Patient is a 58 year old woman who presents about 3-1/2 weeks status post right transtibial amputation.  She is currently wearing a stump shrinker she states she has a little bit of redness around some of the staples laterally.  She is working with Museum/gallery curator.  Assessment & Plan: Visit Diagnoses:  1. Unilateral complete BKA, right, subsequent encounter ([place])   2. Type 2 diabetes mellitus with diabetic polyneuropathy, without long-term current use of insulin (HCC)     Plan: The staples are harvested she will continue with her stump shrinker she will follow-up with [facility] for prosthetic fitting.  Follow-Up Instructions: Return in about 3 weeks (around 08/23/2018).   Ortho Exam  Patient is alert, oriented, no adenopathy, well-dressed, normal affect, normal respiratory effort. Examination patient's residual limb is consolidating nicely there is no drainage no odor no signs of infection she does have a little bit of redness around the staples laterally.  Imaging: No results found.   Labs: Lab Results  Component Value Date   HGBA1C 8.8 (H) 07/04/2018   HGBA1C 8.4 (H) 03/15/2018   HGBA1C 7.6 (H) 09/10/2017   ESRSEDRATE 19 10/25/2012   CRP 0.6 10/25/2012   LABURIC 5.7 12/10/2006   REPTSTATUS 07/09/2018 FINAL 07/04/2018   CULT NO GROWTH 5 DAYS 07/04/2018     Lab Results  Component Value Date   ALBUMIN 2.8 (L) 07/12/2018   ALBUMIN 3.2 (L) 07/04/2018   ALBUMIN 3.5 07/04/2018   LABURIC 5.7 12/10/2006    Body mass index is 33 kg/m.  Orders:  No orders of the defined types were placed in this encounter.  No orders of the defined types were placed in  this encounter.    Procedures: No procedures performed  Clinical Data: No additional findings.  ROS:  All other systems negative, except as noted in the HPI. Review of Systems  Objective: Vital Signs: Ht 5\' 10"  (1.778 m)   Wt 230 lb (104.3 kg)   BMI 33.00 kg/m   Specialty Comments:  No specialty comments available.  PMFS History: Patient Active Problem List   Diagnosis Date Noted  . Unilateral complete BKA, right, subsequent encounter ([place])   . S/P unilateral BKA (below knee amputation), right ([place])   . Subtherapeutic international normalized ratio (INR)   . Noncompliance   . Leukocytosis   . Acute blood loss anemia   . Diabetes mellitus type 2 in obese ([place])   . Unilateral complete BKA, right, initial encounter ([place]) 07/11/2018  . Acquired absence of right leg below knee ([place]) 07/08/2018  . Mild protein-calorie malnutrition ([place])   . Osteomyelitis of great toe of right foot ([place]) 07/04/2018  . Intermittent claudication ([place]) 05/11/2017  . Deep vein thrombosis (DVT) of both lower extremities ([place]) 01/05/2017  . Peripheral vascular insufficiency ([place]) 09/25/2015  . Hypothyroidism 01/29/2014  . Obesity (BMI 30-39.9) 08/16/2013  . Impetigo 07/28/2013  . Chronic obstructive pulmonary disease ([place])   . Malignant neoplasm of uterus ([place])   . GERD (gastroesophageal reflux disease) 10/18/2012  .  Hernia, hiatal 10/18/2012  . Hyperlipidemia 04/18/2008  . SMOKER 04/18/2008  . Essential hypertension 04/18/2008   Past Medical History:  Diagnosis Date  . COPD (chronic obstructive pulmonary disease) ([place])   . Depression   . DM (diabetes mellitus) ([place])   . DVT (deep venous thrombosis) (HCC)    x5  . Family history of colon cancer   . GERD (gastroesophageal reflux disease)   . Hyperlipemia   . Hypertension   . Hypothyroid   . Obesity   . PVD (peripheral vascular disease) ([place])   . Uterine cancer ([place])   . UTI (urinary tract infection)     Family History  Problem  Relation Age of Onset  . Colon cancer Brother 43  . Hypertension Mother   . Hypothyroidism Mother   . Heart disease Father   . Diabetes Father   . Hyperlipidemia Father   . Clotting disorder Brother   . Diabetes Brother   . Heart disease Brother     Past Surgical History:  Procedure Laterality Date  . ABDOMINAL HYSTERECTOMY    . AMPUTATION Right 07/08/2018   Procedure: RIGHT BELOW KNEE AMPUTATION;  Surgeon: [name], MD;  Location: [facility];  Service: Orthopedics;  Laterality: Right;  . CESAREAN SECTION    . FEMORAL BYPASS     x 5  . LUMBAR DISC SURGERY     L4-L5  . TOE AMPUTATION     right  . TONSILLECTOMY     Social History   Occupational History  . Occupation: Product manager: [employer]  Tobacco Use  . Smoking status: Current Every Day Smoker    Packs/day: 0.50    Years: 38.00    Pack years: 19.00    Types: Cigarettes  . Smokeless tobacco: Never Used  Substance and Sexual Activity  . Alcohol use: No    Alcohol/week: 0.0 standard drinks  . Drug use: No  . Sexual activity: Not on file

## 2018-08-03 ENCOUNTER — Ambulatory Visit: Payer: Medicare Other | Admitting: Pharmacist Clinician (PhC)/ Clinical Pharmacy Specialist

## 2018-08-03 VITALS — Ht 70.5 in | Wt 221.6 lb

## 2018-08-03 DIAGNOSIS — I82A23 Chronic embolism and thrombosis of axillary vein, bilateral: Secondary | ICD-10-CM | POA: Diagnosis not present

## 2018-08-03 LAB — COAGUCHEK XS/INR WAIVED
INR: 1.8 — ABNORMAL HIGH (ref 0.9–1.1)
Prothrombin Time: 21.5 s

## 2018-08-03 NOTE — Patient Instructions (Signed)
Description   Continue taking 1 1/2 tablets on Mondays, Wednesdays, and Fridays and 1 tablet all other days of the week.  INR today is  1.8  (goal is 1.5-2.5)

## 2018-08-08 ENCOUNTER — Other Ambulatory Visit: Payer: Self-pay

## 2018-08-08 ENCOUNTER — Ambulatory Visit (INDEPENDENT_AMBULATORY_CARE_PROVIDER_SITE_OTHER): Payer: Medicare Other | Admitting: Family Medicine

## 2018-08-08 ENCOUNTER — Encounter: Payer: Self-pay | Admitting: Family Medicine

## 2018-08-08 VITALS — BP 135/79 | HR 105 | Temp 97.1°F

## 2018-08-08 DIAGNOSIS — I743 Embolism and thrombosis of arteries of the lower extremities: Secondary | ICD-10-CM

## 2018-08-08 DIAGNOSIS — R21 Rash and other nonspecific skin eruption: Secondary | ICD-10-CM

## 2018-08-08 NOTE — Progress Notes (Signed)
Subjective:  Patient ID: [name], female    DOB: February 17, 1961  Age: 58 y.o. MRN: [id number]  CC: Toe Pain (pt here today c/o blue spot on toe and she is worried about circulatory problems)   HPI [name] presents for onset of a black spot on the left second toe.  Patient is concerned because of black spot on the second toe of the right foot led to an amputation.  She has a history of multiple surgeries on the right lower extremity by Dr. Sherren Mocha early vein and vascular.  She is concerned that this may be the beginnings of a similar change of events that could lead to another amputation.  She is supposed to go to [facility] for a fitting for her right leg prosthesis today or tomorrow.  Patient denies any pain.  It just popped up in the last couple of days.  She reports 50% decrease in the arterial circulation of the  left lower extremity  Depression screen [facility] 2/9 08/08/2018 07/27/2018 07/19/2018  Decreased Interest 0 0 0  Down, Depressed, Hopeless 0 0 0  PHQ - 2 Score 0 0 0  Altered sleeping - - -  Tired, decreased energy - - -  Change in appetite - - -  Feeling bad or failure about yourself  - - -  Trouble concentrating - - -  Moving slowly or fidgety/restless - - -  Suicidal thoughts - - -  PHQ-9 Score - - -  Difficult doing work/chores - - -    History [name] has a past medical history of COPD (chronic obstructive pulmonary disease) (HCC), Depression, DM (diabetes mellitus) ([place]), DVT (deep venous thrombosis) ([place]), Family history of colon cancer, GERD (gastroesophageal reflux disease), Hyperlipemia, Hypertension, Hypothyroid, Obesity, PVD (peripheral vascular disease) ([place]), Uterine cancer ([place]), and UTI (urinary tract infection).   She has a past surgical history that includes Abdominal hysterectomy; Toe amputation; Lumbar disc surgery; Femoral bypass; Cesarean section; Tonsillectomy; and Amputation (Right, 07/08/2018).   Her family history includes Clotting  disorder in her brother; Colon cancer (age of onset: 54) in her brother; Diabetes in her brother and father; Heart disease in her brother and father; Hyperlipidemia in her father; Hypertension in her mother; Hypothyroidism in her mother.She reports that she has been smoking cigarettes. She has a 19.00 pack-year smoking history. She has never used smokeless tobacco. She reports that she does not drink alcohol or use drugs.    ROS Review of Systems  Constitutional: Negative for fever.  Musculoskeletal: Positive for gait problem (RBKA). Negative for arthralgias and joint swelling.  Neurological: Negative for numbness.  Psychiatric/Behavioral: Negative for agitation.    Objective:  BP 135/79   Pulse (!) 105   Temp (!) 97.1 F (36.2 C) (Oral)   BP Readings from Last 3 Encounters:  08/08/18 135/79  07/27/18 107/71  07/19/18 116/80    Wt Readings from Last 3 Encounters:  08/03/18 221 lb 9.6 oz (100.5 kg)  08/02/18 230 lb (104.3 kg)  07/27/18 230 lb (104.3 kg)     Physical Exam Constitutional:      General: She is not in acute distress.    Appearance: She is well-developed.  Cardiovascular:     Rate and Rhythm: Normal rate and regular rhythm.  Pulmonary:     Breath sounds: Normal breath sounds.  Skin:    General: Skin is warm and dry.     Findings: Lesion (grey/black 5 mm lesion at the distal left 2nd toe.) present.  Neurological:     Mental Status: She is alert and oriented to person, place, and time.  Psychiatric:        Mood and Affect: Mood normal.        Behavior: Behavior normal.        Thought Content: Thought content normal.       Assessment & Plan:   [name] was seen today for toe pain.  Diagnoses and all orders for this visit:  Embolism and thrombosis of arteries of the lower extremities [facility]) -     Ambulatory referral to Vascular Surgery       I am having Larry Sierras. Tuggle-Ziglar maintain her Vitamin D3, ONE TOUCH ULTRA TEST, levothyroxine,  albuterol, atorvastatin, omeprazole, clopidogrel, furosemide, aspirin, metFORMIN, losartan, acetaminophen, methocarbamol, triamcinolone cream, warfarin, and tretinoin.  Allergies as of 08/08/2018      Reactions   Aleve [naproxen Sodium] Hives, Itching   Cortisone Swelling   Internal organs   Prednisone Swelling   Makes internal organs swell   Vancomycin Itching      Medication List       Accurate as of August 08, 2018  9:51 PM. Always use your most recent med list.        acetaminophen 325 MG tablet Commonly known as:  TYLENOL Take 1-2 tablets (325-650 mg total) by mouth every 4 (four) hours as needed for mild pain.   albuterol 108 (90 Base) MCG/ACT inhaler Commonly known as:  PROVENTIL HFA;VENTOLIN HFA Inhale 2 puffs into the lungs every 6 (six) hours as needed for wheezing or shortness of breath.   aspirin 81 MG EC tablet Take 1 tablet (81 mg total) by mouth daily.   atorvastatin 40 MG tablet Commonly known as:  LIPITOR TAKE 1 TABLET DAILY   clopidogrel 75 MG tablet Commonly known as:  PLAVIX TAKE 1 TABLET DAILY   furosemide 20 MG tablet Commonly known as:  LASIX TAKE 1 TABLET DAILY   levothyroxine 100 MCG tablet Commonly known as:  SYNTHROID, LEVOTHROID TAKE (1) TABLET DAILY BE- FORE BREAKFAST.   losartan 50 MG tablet Commonly known as:  COZAAR Take 1 tablet (50 mg total) by mouth daily.   metFORMIN 500 MG tablet Commonly known as:  GLUCOPHAGE Take 1 tablet (500 mg total) by mouth 2 (two) times daily with a meal.   methocarbamol 500 MG tablet Commonly known as:  ROBAXIN Take 1 tablet (500 mg total) by mouth every 6 (six) hours as needed for muscle spasms.   omeprazole 20 MG capsule Commonly known as:  PRILOSEC TAKE (1) CAPSULE DAILY   ONE TOUCH ULTRA TEST test strip Generic drug:  glucose blood TEST BLOOD SUGARS TWICE A DAY   tretinoin 0.05 % cream Commonly known as:  RETIN-A Apply topically at bedtime.   triamcinolone cream 0.1 % Commonly known  as:  KENALOG Apply 1 application topically 2 (two) times daily.   Vitamin D3 125 MCG (5000 UT) Tabs Take 1 tablet by mouth daily.   warfarin 5 MG tablet Commonly known as:  COUMADIN Take as directed by the anticoagulation clinic. If you are unsure how to take this medication, talk to your nurse or doctor. Original instructions:  Take 1 tablet (5 mg total) by mouth daily at 6 PM for 1 dose.        Follow-up: Return if symptoms worsen or fail to improve.  Claretta Fraise, M.D.

## 2018-08-10 ENCOUNTER — Other Ambulatory Visit (HOSPITAL_COMMUNITY): Payer: Self-pay | Admitting: Vascular Surgery

## 2018-08-10 ENCOUNTER — Ambulatory Visit (HOSPITAL_COMMUNITY)
Admission: RE | Admit: 2018-08-10 | Discharge: 2018-08-10 | Disposition: A | Discharge status: Home / Self Care | Payer: Medicare Other | Source: Ambulatory Visit | Attending: Family | Admitting: Family

## 2018-08-10 DIAGNOSIS — I749 Embolism and thrombosis of unspecified artery: Secondary | ICD-10-CM

## 2018-08-10 LAB — FUNGUS CULTURE, BLOOD

## 2018-08-16 ENCOUNTER — Encounter: Payer: Self-pay | Admitting: Vascular Surgery

## 2018-08-16 ENCOUNTER — Ambulatory Visit (INDEPENDENT_AMBULATORY_CARE_PROVIDER_SITE_OTHER): Payer: Medicare Other | Admitting: Vascular Surgery

## 2018-08-16 ENCOUNTER — Other Ambulatory Visit: Payer: Self-pay

## 2018-08-16 VITALS — BP 116/80 | HR 88 | Temp 97.6°F | Ht 70.5 in | Wt 221.0 lb

## 2018-08-16 DIAGNOSIS — I739 Peripheral vascular disease, unspecified: Secondary | ICD-10-CM

## 2018-08-16 DIAGNOSIS — I749 Embolism and thrombosis of unspecified artery: Secondary | ICD-10-CM | POA: Diagnosis not present

## 2018-08-16 NOTE — Progress Notes (Signed)
Vascular and Vein Specialist of [facility]  Patient name: [name] MRN: [id number] DOB: 1960-12-29 Sex: female  REASON FOR VISIT: Evaluation arterial flow to left foot  HPI: [name] is a 58 y.o. female long-term patient of mine with multiple prior bypasses over the past 18 years.  She recently had progressive gangrenous changes in her right foot despite patent bypass and underwent below-knee amputation on the right with Dr. Sharol Given.  She has done extremely well following this and has had no difficulty with healing.  She recently noted a darkened discoloration under the nail bed on her left third toe and is seen for further evaluation.  She denies any pain and denies any claudication type symptoms.  No rest pain.  Past Medical History:  Diagnosis Date  . COPD (chronic obstructive pulmonary disease) ([place])   . Depression   . DM (diabetes mellitus) ([place])   . DVT (deep venous thrombosis) (HCC)    x5  . Family history of colon cancer   . GERD (gastroesophageal reflux disease)   . Hyperlipemia   . Hypertension   . Hypothyroid   . Obesity   . PVD (peripheral vascular disease) ([place])   . Uterine cancer ([place])   . UTI (urinary tract infection)     Family History  Problem Relation Age of Onset  . Colon cancer Brother 61  . Hypertension Mother   . Hypothyroidism Mother   . Heart disease Father   . Diabetes Father   . Hyperlipidemia Father   . Clotting disorder Brother   . Diabetes Brother   . Heart disease Brother     SOCIAL HISTORY: Social History   Tobacco Use  . Smoking status: Former Smoker    Years: 38.00    Types: Cigarettes    Last attempt to quit: 07/06/2018    Years since quitting: 0.1  . Smokeless tobacco: Never Used  Substance Use Topics  . Alcohol use: No    Alcohol/week: 0.0 standard drinks    Allergies  Allergen Reactions  . Aleve [Naproxen Sodium] Hives and Itching  . Cortisone Swelling   Internal organs  . Prednisone Swelling    Makes internal organs swell  . Vancomycin Itching    Current Outpatient Medications  Medication Sig Dispense Refill  . acetaminophen (TYLENOL) 325 MG tablet Take 1-2 tablets (325-650 mg total) by mouth every 4 (four) hours as needed for mild pain.    Marland Kitchen albuterol (PROVENTIL HFA;VENTOLIN HFA) 108 (90 Base) MCG/ACT inhaler Inhale 2 puffs into the lungs every 6 (six) hours as needed for wheezing or shortness of breath. 1 Inhaler 11  . aspirin EC 81 MG EC tablet Take 1 tablet (81 mg total) by mouth daily.    Marland Kitchen atorvastatin (LIPITOR) 40 MG tablet TAKE 1 TABLET DAILY (Patient taking differently: Take 40 mg by mouth daily. ) 90 tablet 0  . Cholecalciferol (VITAMIN D3) 5000 units TABS Take 1 tablet by mouth daily.    . clopidogrel (PLAVIX) 75 MG tablet TAKE 1 TABLET DAILY (Patient taking differently: Take 75 mg by mouth once. ) 90 tablet 0  . furosemide (LASIX) 20 MG tablet TAKE 1 TABLET DAILY (Patient taking differently: Take 20 mg by mouth daily. ) 90 tablet 0  . levothyroxine (SYNTHROID, LEVOTHROID) 100 MCG tablet TAKE (1) TABLET DAILY BE- FORE BREAKFAST. (Patient taking differently: Take 100 mcg by mouth daily. ) 90 tablet 0  . losartan (COZAAR) 50 MG tablet Take 1 tablet (50 mg total) by mouth  daily. 30 tablet 0  . metFORMIN (GLUCOPHAGE) 500 MG tablet Take 1 tablet (500 mg total) by mouth 2 (two) times daily with a meal.    . methocarbamol (ROBAXIN) 500 MG tablet Take 1 tablet (500 mg total) by mouth every 6 (six) hours as needed for muscle spasms. 90 tablet 0  . omeprazole (PRILOSEC) 20 MG capsule TAKE (1) CAPSULE DAILY (Patient taking differently: Take 20 mg by mouth daily. ) 90 capsule 0  . ONE TOUCH ULTRA TEST test strip TEST BLOOD SUGARS TWICE A DAY 50 each 2  . tretinoin (RETIN-A) 0.05 % cream Apply topically at bedtime. 45 g 3  . triamcinolone cream (KENALOG) 0.1 % Apply 1 application topically 2 (two) times daily. 30 g 0  . warfarin (COUMADIN) 5 MG  tablet Take 1 tablet (5 mg total) by mouth daily at 6 PM for 1 dose. 90 tablet 1   No current facility-administered medications for this visit.     REVIEW OF SYSTEMS:  [X]  denotes positive finding, [ ]  denotes negative finding Cardiac  Comments:  Chest pain or chest pressure:    Shortness of breath upon exertion:    Short of breath when lying flat:    Irregular heart rhythm:        Vascular    Pain in calf, thigh, or hip brought on by ambulation:    Pain in feet at night that wakes you up from your sleep:     Blood clot in your veins:    Leg swelling:           PHYSICAL EXAM: Vitals:   08/16/18 1101  BP: 116/80  Pulse: 88  Temp: 97.6 F (36.4 C)  TempSrc: Oral  SpO2: 97%  Weight: 221 lb (100.2 kg)  Height: 5' 10.5" (1.791 m)    GENERAL: The patient is a well-nourished female, in no acute distress. The vital signs are documented above. CARDIOVASCULAR: I do not palpate popliteal or distal pulses on the left.  She does have darkened discoloration under the nail bed of her third toe. PULMONARY: There is good air exchange  MUSCULOSKELETAL: There are no major deformities or cyanosis.  Right below-knee amputation with compression dressing intact NEUROLOGIC: No focal weakness or paresthesias are detected. SKIN: There are no ulcers or rashes noted. PSYCHIATRIC: The patient has a normal affect.  DATA:  Noninvasive studies from 08/10/2018 reveal ankle arm index of 0.82 on the left.  She has high-grade stenosis in her proximal superficial femoral artery  MEDICAL ISSUES: Long-term patient with premature atherosclerotic disease with multiple prior procedures to her right leg and eventual amputation 2 months ago.  This area on her left foot does not appear to be atheroembolic.  Appears to be more hemorrhage under the nailbed itself.  She is not having any rest pain or claudication type symptoms.  She had no left calf claudication prior to her right below-knee amputation.  I recommend  close observation only.  This does not appear to be embolic from her proximal superficial femoral stenosis.  She will notify should she develop any symptoms or physical findings of embolus.  Otherwise we will see her again on an as-needed basis    Rosetta Posner, MD [facility] and Vein Specialists of [facility] Tel [phone] Pager [phone]

## 2018-08-23 ENCOUNTER — Encounter (INDEPENDENT_AMBULATORY_CARE_PROVIDER_SITE_OTHER): Payer: Self-pay | Admitting: Orthopedic Surgery

## 2018-08-23 ENCOUNTER — Other Ambulatory Visit: Payer: Self-pay

## 2018-08-23 ENCOUNTER — Ambulatory Visit (INDEPENDENT_AMBULATORY_CARE_PROVIDER_SITE_OTHER): Payer: Medicare Other | Admitting: Orthopedic Surgery

## 2018-08-23 VITALS — Ht 70.0 in | Wt 221.0 lb

## 2018-08-23 DIAGNOSIS — S88111D Complete traumatic amputation at level between knee and ankle, right lower leg, subsequent encounter: Secondary | ICD-10-CM

## 2018-08-23 NOTE — Progress Notes (Signed)
Office Visit Note   Patient: [name]           Date of Birth: 1961-01-07           MRN: [id number] Visit Date: 08/23/2018              Requested by: Chipper Herb, MD [address] [place],  [address] PCP: Chipper Herb, MD  Chief Complaint  Patient presents with  . Right Leg - Routine Post Op    07/08/18 right BKA [facility]       HPI: Patient is a 58 year old woman who is about 6 weeks status post right transtibial amputation.  She has started wearing her silicone sleeve and is developed some areas of redness and maceration over the residual limb from draining from 2 open wounds.  Assessment & Plan: Visit Diagnoses:  1. Unilateral complete BKA, right, subsequent encounter ([place])     Plan: Recommended that she wear the double extra-large stump shrinker directly against the skin and she may increase the time she wears the silicone sleeve on top of the compression sleeve.  This should allow the compression sleeve to minimize maceration.  Patient states that she has had a lot of moisture inside the silicone sleeve.  She will try to decrease the size of the compression sleeve as the swelling resolves.  Follow-Up Instructions: Return in about 3 weeks (around 09/13/2018).   Ortho Exam  Patient is alert, oriented, no adenopathy, well-dressed, normal affect, normal respiratory effort. Examination patient has 2 scabs with good granulation tissue around the wound edges.  There is no cellulitis no purulent drainage no signs of infection.  Patient is developing some dermatitis from the drainage from these 2 wounds that is being trapped against the skin with a silicone sleeve.  Imaging: No results found.   Labs: Lab Results  Component Value Date   HGBA1C 8.8 (H) 07/04/2018   HGBA1C 8.4 (H) 03/15/2018   HGBA1C 7.6 (H) 09/10/2017   ESRSEDRATE 19 10/25/2012   CRP 0.6 10/25/2012   LABURIC 5.7 12/10/2006   REPTSTATUS 07/09/2018 FINAL 07/04/2018   CULT NO GROWTH 5  DAYS 07/04/2018     Lab Results  Component Value Date   ALBUMIN 2.8 (L) 07/12/2018   ALBUMIN 3.2 (L) 07/04/2018   ALBUMIN 3.5 07/04/2018   LABURIC 5.7 12/10/2006    Body mass index is 31.71 kg/m.  Orders:  No orders of the defined types were placed in this encounter.  No orders of the defined types were placed in this encounter.    Procedures: No procedures performed  Clinical Data: No additional findings.  ROS:  All other systems negative, except as noted in the HPI. Review of Systems  Objective: Vital Signs: Ht 5\' 10"  (1.778 m)   Wt 221 lb (100.2 kg)   BMI 31.71 kg/m   Specialty Comments:  No specialty comments available.  PMFS History: Patient Active Problem List   Diagnosis Date Noted  . Unilateral complete BKA, right, subsequent encounter ([place])   . S/P unilateral BKA (below knee amputation), right ([place])   . Subtherapeutic international normalized ratio (INR)   . Noncompliance   . Leukocytosis   . Acute blood loss anemia   . Diabetes mellitus type 2 in obese ([place])   . Unilateral complete BKA, right, initial encounter ([place]) 07/11/2018  . Acquired absence of right leg below knee ([place]) 07/08/2018  . Mild protein-calorie malnutrition ([place])   . Osteomyelitis of great toe of right foot ([place])  07/04/2018  . Intermittent claudication ([place]) 05/11/2017  . Deep vein thrombosis (DVT) of both lower extremities ([place]) 01/05/2017  . Peripheral vascular insufficiency ([place]) 09/25/2015  . Hypothyroidism 01/29/2014  . Obesity (BMI 30-39.9) 08/16/2013  . Impetigo 07/28/2013  . Chronic obstructive pulmonary disease ([place])   . Malignant neoplasm of uterus ([place])   . GERD (gastroesophageal reflux disease) 10/18/2012  . Hernia, hiatal 10/18/2012  . Hyperlipidemia 04/18/2008  . SMOKER 04/18/2008  . Essential hypertension 04/18/2008   Past Medical History:  Diagnosis Date  . COPD (chronic obstructive pulmonary disease) ([place])   . Depression   . DM (diabetes mellitus)  ([place])   . DVT (deep venous thrombosis) (HCC)    x5  . Family history of colon cancer   . GERD (gastroesophageal reflux disease)   . Hyperlipemia   . Hypertension   . Hypothyroid   . Obesity   . PVD (peripheral vascular disease) ([place])   . Uterine cancer ([place])   . UTI (urinary tract infection)     Family History  Problem Relation Age of Onset  . Colon cancer Brother 44  . Hypertension Mother   . Hypothyroidism Mother   . Heart disease Father   . Diabetes Father   . Hyperlipidemia Father   . Clotting disorder Brother   . Diabetes Brother   . Heart disease Brother     Past Surgical History:  Procedure Laterality Date  . ABDOMINAL HYSTERECTOMY    . AMPUTATION Right 07/08/2018   Procedure: RIGHT BELOW KNEE AMPUTATION;  Surgeon: [name], MD;  Location: [facility];  Service: Orthopedics;  Laterality: Right;  . CESAREAN SECTION    . FEMORAL BYPASS     x 5  . LUMBAR DISC SURGERY     L4-L5  . TOE AMPUTATION     right  . TONSILLECTOMY     Social History   Occupational History  . Occupation: Product manager: [employer]  Tobacco Use  . Smoking status: Former Smoker    Years: 38.00    Types: Cigarettes    Last attempt to quit: 07/06/2018    Years since quitting: 0.1  . Smokeless tobacco: Never Used  Substance and Sexual Activity  . Alcohol use: No    Alcohol/week: 0.0 standard drinks  . Drug use: No  . Sexual activity: Not on file

## 2018-08-24 ENCOUNTER — Encounter: Payer: Self-pay | Admitting: Pharmacist Clinician (PhC)/ Clinical Pharmacy Specialist

## 2018-08-27 ENCOUNTER — Encounter: Payer: Self-pay | Admitting: Family Medicine

## 2018-09-07 ENCOUNTER — Encounter: Payer: Medicare Other | Admitting: Pharmacist Clinician (PhC)/ Clinical Pharmacy Specialist

## 2018-09-09 ENCOUNTER — Telehealth (INDEPENDENT_AMBULATORY_CARE_PROVIDER_SITE_OTHER): Payer: Self-pay | Admitting: Orthopedic Surgery

## 2018-09-09 ENCOUNTER — Telehealth (INDEPENDENT_AMBULATORY_CARE_PROVIDER_SITE_OTHER): Payer: Self-pay

## 2018-09-09 NOTE — Telephone Encounter (Signed)
Patient states she has an appt on 09/13/18, that she will not be able to keep due to the COVID-19. But she states that she need to send a picture of her leg to Dr Sharol Given and that she thinks she needs an antibiotic. Patient's call back # [phone]

## 2018-09-09 NOTE — Telephone Encounter (Signed)
Pt was called, see notes.

## 2018-09-09 NOTE — Telephone Encounter (Signed)
Pt was called and scheduled to come in on Mon 09/12/2018 stated that she is having drainage and increased edema and redness with BKA. She wanted to reschedule to later date due to COVID-19 but I advised her that it was best to be seen in the office due to increase issues. She also wanted antibiotics but due to her circumstance we are not allowed to send antibiotics via phone not knowing what kind of infection she has. Pt understood and will be in on Monday.

## 2018-09-12 ENCOUNTER — Encounter (INDEPENDENT_AMBULATORY_CARE_PROVIDER_SITE_OTHER): Payer: Self-pay | Admitting: Orthopedic Surgery

## 2018-09-12 ENCOUNTER — Ambulatory Visit (INDEPENDENT_AMBULATORY_CARE_PROVIDER_SITE_OTHER): Payer: Medicare Other | Admitting: Physician Assistant

## 2018-09-12 ENCOUNTER — Other Ambulatory Visit: Payer: Self-pay

## 2018-09-12 VITALS — Ht 70.0 in | Wt 221.0 lb

## 2018-09-12 DIAGNOSIS — E1142 Type 2 diabetes mellitus with diabetic polyneuropathy: Secondary | ICD-10-CM

## 2018-09-12 DIAGNOSIS — Z89511 Acquired absence of right leg below knee: Secondary | ICD-10-CM

## 2018-09-12 DIAGNOSIS — E440 Moderate protein-calorie malnutrition: Secondary | ICD-10-CM

## 2018-09-12 DIAGNOSIS — I739 Peripheral vascular disease, unspecified: Secondary | ICD-10-CM

## 2018-09-12 DIAGNOSIS — S88111D Complete traumatic amputation at level between knee and ankle, right lower leg, subsequent encounter: Secondary | ICD-10-CM

## 2018-09-12 MED ORDER — DOXYCYCLINE HYCLATE 100 MG PO CAPS: 100.0000 mg | ORAL_CAPSULE | Freq: Two times a day (BID) | ORAL | 1 refills | Status: DC

## 2018-09-12 NOTE — Progress Notes (Signed)
Office Visit Note   Patient: [name]           Date of Birth: 1960-12-14           MRN: [id number] Visit Date: 09/12/2018              Requested by: Chipper Herb, MD [address] [place], [place] [address] PCP: Chipper Herb, MD  Chief Complaint  Patient presents with  . Right Leg - Routine Post Op    07/08/2018 right leg BKA      HPI: The patient is a 58 year old woman who is seen for postoperative follow-up for right transtibial amputation on 07/08/2018.  She reports that she is continued to have some scabbing areas over the incisional line and some continued drainage from a small opening over the distal residual limb.  She is utilizing a vive stump shrinker stocking as well as a compression sock to the left foot and this is helping control her left lower extremity edema.  She reports that the stump shrinker stocking does stick to the scab areas at times.  She has been working with [employer] [facility] clinic but has not been wearing her silicon sleeve yet due to the continued drainage over the distal residual limb.  Assessment & Plan: Visit Diagnoses:  1. Unilateral complete BKA, right, subsequent encounter ([place])   2. Type 2 diabetes mellitus with diabetic polyneuropathy, without long-term current use of insulin (At[place])   3. Peripheral vascular insufficiency ([place])   4. Moderate protein-calorie malnutrition ([place]ar[place])     Plan: Start doxycycline 100 mg p.o. twice daily for the next 2 weeks.  She will continue to wear her silver stump shrinker stocking directly over the residual limb following washing with Dial soap and water.  Continue her compression sock to the left foot for edema control.  She will follow-up in 2 weeks or sooner should she have difficulties in the interim.  Follow-Up Instructions: Return in about 2 weeks (around 09/26/2018).   Ortho Exam  Patient is alert, oriented, no adenopathy, well-dressed, normal affect, normal respiratory effort. The right transtibial  amputation site incision has crusting medially and laterally with a small open area over the central incision which is draining serous appearing drainage.  There is some mild erythema and maceration over this area.  The wound is appear to be superficial and does not appear to track deep but has continued to drain for several weeks now.  She has full knee extension and good flexion.  Imaging: No results found. No images are attached to the encounter.  Labs: Lab Results  Component Value Date   HGBA1C 8.8 (H) 07/04/2018   HGBA1C 8.4 (H) 03/15/2018   HGBA1C 7.6 (H) 09/10/2017   ESRSEDRATE 19 10/25/2012   CRP 0.6 10/25/2012   LABURIC 5.7 12/10/2006   REPTSTATUS 07/09/2018 FINAL 07/04/2018   CULT NO GROWTH 5 DAYS 07/04/2018     Lab Results  Component Value Date   ALBUMIN 2.8 (L) 07/12/2018   ALBUMIN 3.2 (L) 07/04/2018   ALBUMIN 3.5 07/04/2018   LABURIC 5.7 12/10/2006    Body mass index is 31.71 kg/m.  Orders:  No orders of the defined types were placed in this encounter.  Meds ordered this encounter  Medications  . doxycycline (VIBRAMYCIN) 100 MG capsule    Sig: Take 1 capsule (100 mg total) by mouth 2 (two) times daily.    Dispense:  28 capsule    Refill:  1     Procedures: No  procedures performed  Clinical Data: No additional findings.  ROS:  All other systems negative, except as noted in the HPI. Review of Systems  Objective: Vital Signs: Ht 5\' 10"  (1.778 m)   Wt 221 lb (100.2 kg)   BMI 31.71 kg/m   Specialty Comments:  No specialty comments available.  PMFS History: Patient Active Problem List   Diagnosis Date Noted  . Unilateral complete BKA, right, subsequent encounter ([place])   . S/P unilateral BKA (below knee amputation), right ([place])   . Subtherapeutic international normalized ratio (INR)   . Noncompliance   . Leukocytosis   . Acute blood loss anemia   . Diabetes mellitus type 2 in obese ([place])   . Unilateral complete BKA, right, initial  encounter ([place]arl[place]) 07/11/2018  . Acquired absence of right leg below knee ([place]) 07/08/2018  . Mild protein-calorie malnutrition ([place])   . Osteomyelitis of great toe of right foot ([place]) 07/04/2018  . Intermittent claudication ([place]) 05/11/2017  . Deep vein thrombosis (DVT) of both lower extremities ([place]) 01/05/2017  . Peripheral vascular insufficiency ([place]) 09/25/2015  . Hypothyroidism 01/29/2014  . Obesity (BMI 30-39.9) 08/16/2013  . Impetigo 07/28/2013  . Chronic obstructive pulmonary disease ([place])   . Malignant neoplasm of uterus ([place])   . GERD (gastroesophageal reflux disease) 10/18/2012  . Hernia, hiatal 10/18/2012  . Hyperlipidemia 04/18/2008  . SMOKER 04/18/2008  . Essential hypertension 04/18/2008   Past Medical History:  Diagnosis Date  . COPD (chronic obstructive pulmonary disease) ([place])   . Depression   . DM (diabetes mellitus) ([place])   . DVT (deep venous thrombosis) (HCC)    x5  . Family history of colon cancer   . GERD (gastroesophageal reflux disease)   . Hyperlipemia   . Hypertension   . Hypothyroid   . Obesity   . PVD (peripheral vascular disease) ([place])   . Uterine cancer (Blanchard)   . UTI (urinary tract infection)     Family History  Problem Relation Age of Onset  . Colon cancer Brother 37  . Hypertension Mother   . Hypothyroidism Mother   . Heart disease Father   . Diabetes Father   . Hyperlipidemia Father   . Clotting disorder Brother   . Diabetes Brother   . Heart disease Brother     Past Surgical History:  Procedure Laterality Date  . ABDOMINAL HYSTERECTOMY    . AMPUTATION Right 07/08/2018   Procedure: RIGHT BELOW KNEE AMPUTATION;  Surgeon: [name], MD;  Location: [facility];  Service: Orthopedics;  Laterality: Right;  . CESAREAN SECTION    . FEMORAL BYPASS     x 5  . LUMBAR DISC SURGERY     L4-L5  . TOE AMPUTATION     right  . TONSILLECTOMY     Social History   Occupational History  . Occupation: Product manager: [employer]   Tobacco Use  . Smoking status: Former Smoker    Years: 38.00    Types: Cigarettes    Last attempt to quit: 07/06/2018    Years since quitting: 0.1  . Smokeless tobacco: Never Used  Substance and Sexual Activity  . Alcohol use: No    Alcohol/week: 0.0 standard drinks  . Drug use: No  . Sexual activity: Not on file

## 2018-09-13 ENCOUNTER — Ambulatory Visit (INDEPENDENT_AMBULATORY_CARE_PROVIDER_SITE_OTHER): Payer: Medicare Other | Admitting: Orthopedic Surgery

## 2018-09-14 ENCOUNTER — Encounter (INDEPENDENT_AMBULATORY_CARE_PROVIDER_SITE_OTHER): Payer: Self-pay | Admitting: Physician Assistant

## 2018-09-20 ENCOUNTER — Other Ambulatory Visit: Payer: Self-pay | Admitting: Family Medicine

## 2018-09-20 ENCOUNTER — Telehealth: Payer: Medicare Other | Admitting: Physician Assistant

## 2018-09-20 DIAGNOSIS — R233 Spontaneous ecchymoses: Secondary | ICD-10-CM

## 2018-09-20 DIAGNOSIS — R21 Rash and other nonspecific skin eruption: Secondary | ICD-10-CM

## 2018-09-20 DIAGNOSIS — R238 Other skin changes: Secondary | ICD-10-CM

## 2018-09-20 NOTE — Progress Notes (Signed)
Based on what you shared with me, I feel your condition warrants further evaluation and I recommend that you be seen for a face to face office visit.     NOTE: If you entered your credit card information for this eVisit, you will not be charged. You may see a "hold" on your card for the $35 but that hold will drop off and you will not have a charge processed.  If you are having a true medical emergency please call 911.  If you need an urgent face to face visit, [facility] has four urgent care centers for your convenience.    PLEASE NOTE: THE [facility] LOCATIONS AND URGENT CARE CLINICS DO NOT HAVE THE TESTING FOR CORONAVIRUS COVID19 AVAILABLE.  IF YOU FEEL YOU NEED THIS TEST YOU MUST GO TO A TRIAGE LOCATION AT ONE OF THE HOSPITAL EMERGENCY DEPARTMENTS   [email or web address] to reserve your spot online an avoid wait times  [facility] [address], [address] [place], [place] [address] Modified hours of operation: Monday-Friday, 10 AM to 6 PM  Saturday & Sunday 10 AM to 4 PM *Across the street from [employer]  [facility] (New Address!) [address], [address] [place], [place] [address] *Just off [address], across the road from [employer]* Modified hours of operation: Monday-Friday, 10 AM to 5 PM  Closed Saturday & Sunday   The following sites will take your insurance:  . [facility]  [phone] Get Driving Directions Find a Provider at this Location  [address] [place], [place] [address] . 10 am to 8 pm Monday-Friday . 12 pm to 8 pm Saturday-Sunday   . [facility]  [phone] Get Driving Directions Find a Provider at this Location  [address], [address] [place], [place] [address] . 8 am to 8 pm Monday-Friday . 9 am to 6 pm Saturday . 11 am to 6 pm Sunday   . [facility] [place]  [phone] Get Driving Directions  [address] [place],  [address] . 8 am to 8 pm Monday-Friday . 8 am to 4 pm Saturday-Sunday   Your e-visit answers were reviewed by a board certified advanced clinical practitioner to complete your personal care plan.  Thank you for using e-Visits. 

## 2018-09-21 ENCOUNTER — Other Ambulatory Visit: Payer: Self-pay | Admitting: Family Medicine

## 2018-09-22 ENCOUNTER — Telehealth: Payer: Self-pay | Admitting: Family Medicine

## 2018-09-22 NOTE — Telephone Encounter (Signed)
Pt aware Kenalog cream sent into pharmacy today

## 2018-09-22 NOTE — Telephone Encounter (Signed)
PT states that she was prescribed triamcinolone cream (KENALOG) 0.1 % and it was being used to help clear rash up on her face, states that it is almost cleared but she is about out of the medicine, wants to know if we can send in a refill  Pharmacy: [facility]

## 2018-09-26 ENCOUNTER — Other Ambulatory Visit: Payer: Self-pay

## 2018-09-26 ENCOUNTER — Encounter (INDEPENDENT_AMBULATORY_CARE_PROVIDER_SITE_OTHER): Payer: Self-pay | Admitting: Orthopedic Surgery

## 2018-09-26 ENCOUNTER — Ambulatory Visit (INDEPENDENT_AMBULATORY_CARE_PROVIDER_SITE_OTHER): Payer: Medicare Other | Admitting: Physician Assistant

## 2018-09-26 VITALS — Ht 70.0 in | Wt 221.0 lb

## 2018-09-26 DIAGNOSIS — I739 Peripheral vascular disease, unspecified: Secondary | ICD-10-CM

## 2018-09-26 DIAGNOSIS — E1142 Type 2 diabetes mellitus with diabetic polyneuropathy: Secondary | ICD-10-CM

## 2018-09-26 DIAGNOSIS — Z89511 Acquired absence of right leg below knee: Secondary | ICD-10-CM

## 2018-09-26 NOTE — Progress Notes (Signed)
Office Visit Note   Patient: [name]           Date of Birth: [birth year]           MRN: [id number] Visit Date: 09/26/2018              Requested by: Chipper Herb, MD [address] [place], [place] [address] PCP: Chipper Herb, MD  Chief Complaint  Patient presents with  . Left Foot - Follow-up  . Right Leg - Routine Post Op    07/08/2018 right BKA      HPI: The patient is a 58 year old woman who is seen for postoperative follow-up following a right transtibial amputation on 07/08/2018.  She is wearing a stump shrinker stocking directly on the wound bed and reports that it continues to pull the scabs off when she changes it.  She is anxious to get fitted with her prosthesis.  She finished a course of doxycycline.  Assessment & Plan: Visit Diagnoses:  1. Acquired absence of right lower extremity below knee ([place]man)   2. Type 2 diabetes mellitus with diabetic polyneuropathy, without long-term current use of insulin (Everett)   3. Peripheral vascular insufficiency ([place])     Plan: She should continue to wash the residual limb with soap and water daily and apply Iodosorb ointment which she was given to the open areas and then apply her stump shrinker stocking directly over this.  We also discussed a whey protein supplement, probiotic, multivitamin vitamin C and zinc as adjuvant treatment to try to encourage wound healing and she was given written information concerning this.  She will follow-up in 1 week or sooner should she have difficulties in the interim.  Follow-Up Instructions: Return in about 1 week (around 10/03/2018).   Ortho Exam  Patient is alert, oriented, no adenopathy, well-dressed, normal affect, normal respiratory effort. The right transtibial amputation site has a scabbed area laterally which is draining a small amount of serous appearing fluid.  She has a similar but smaller area over the central incision line and then immediately a small area of purple  Vicryl suture is present medially but she does not want this to be removed.  We applied Iodosorb ointment to all of the areas and the stump shrinker stocking was applied on top of this.  She was advised to continue to do this daily.  She has good knee extension.  She has minimal edema no signs of cellulitis or infection currently.  Imaging: No results found. No images are attached to the encounter.  Labs: Lab Results  Component Value Date   HGBA1C 8.8 (H) 07/04/2018   HGBA1C 8.4 (H) 03/15/2018   HGBA1C 7.6 (H) 09/10/2017   ESRSEDRATE 19 10/25/2012   CRP 0.6 10/25/2012   LABURIC 5.7 12/10/2006   REPTSTATUS 07/09/2018 FINAL 07/04/2018   CULT NO GROWTH 5 DAYS 07/04/2018     Lab Results  Component Value Date   ALBUMIN 2.8 (L) 07/12/2018   ALBUMIN 3.2 (L) 07/04/2018   ALBUMIN 3.5 07/04/2018   LABURIC 5.7 12/10/2006    Body mass index is 31.71 kg/m.  Orders:  No orders of the defined types were placed in this encounter.  No orders of the defined types were placed in this encounter.    Procedures: No procedures performed  Clinical Data: No additional findings.  ROS:  All other systems negative, except as noted in the HPI. Review of Systems  Objective: Vital Signs: Ht 5\' 10"  (1.778 m)  Wt 221 lb (100.2 kg)   BMI 31.71 kg/m   Specialty Comments:  No specialty comments available.  PMFS History: Patient Active Problem List   Diagnosis Date Noted  . Unilateral complete BKA, right, subsequent encounter (Lakewood)   . S/P unilateral BKA (below knee amputation), right (Winder[place])   . Subtherapeutic international normalized ratio (INR)   . Noncompliance   . Leukocytosis   . Acute blood loss anemia   . Diabetes mellitus type 2 in obese ([place]land)   . Unilateral complete BKA, right, initial encounter (Thomas[place]) 07/11/2018  . Acquired absence of right leg below knee ([place]) 07/08/2018  . Mild protein-calorie malnutrition (Atlantic Beach)   . Osteomyelitis of great toe of right foot ([place]) 07/04/2018   . Intermittent claudication (Novato) 05/11/2017  . Deep vein thrombosis (DVT) of both lower extremities ([place]) 01/05/2017  . Peripheral vascular insufficiency ([place]ornton) 09/25/2015  . Hypothyroidism 01/29/2014  . Obesity (BMI 30-39.9) 08/16/2013  . Impetigo 07/28/2013  . Chronic obstructive pulmonary disease (Sal[place] Brook)   . Malignant neoplasm of uterus ([place])   . GERD (gastroesophageal reflux disease) 10/18/2012  . Hernia, hiatal 10/18/2012  . Hyperlipidemia 04/18/2008  . SMOKER 04/18/2008  . Essential hypertension 04/18/2008   Past Medical History:  Diagnosis Date  . COPD (chronic obstructive pulmonary disease) (Buckhead)   . Depression   . DM (diabetes mellitus) (Milford)   . DVT (deep venous thrombosis) (HCC)    x5  . Family history of colon cancer   . GERD (gastroesophageal reflux disease)   . Hyperlipemia   . Hypertension   . Hypothyroid   . Obesity   . PVD (peripheral vascular disease) (Axis)   . Uterine cancer (Cannon)   . UTI (urinary tract infection)     Family History  Problem Relation Age of Onset  . Colon cancer Brother 42  . Hypertension Mother   . Hypothyroidism Mother   . Heart disease Father   . Diabetes Father   . Hyperlipidemia Father   . Clotting disorder Brother   . Diabetes Brother   . Heart disease Brother     Past Surgical History:  Procedure Laterality Date  . ABDOMINAL HYSTERECTOMY    . AMPUTATION Right 07/08/2018   Procedure: RIGHT BELOW KNEE AMPUTATION;  Surgeon: [name], MD;  Location: [facility];  Service: Orthopedics;  Laterality: Right;  . CESAREAN SECTION    . FEMORAL BYPASS     x 5  . LUMBAR DISC SURGERY     L4-L5  . TOE AMPUTATION     right  . TONSILLECTOMY     Social History   Occupational History  . Occupation: Product manager: [employer]  Tobacco Use  . Smoking status: Former Smoker    Years: 38.00    Types: Cigarettes    Last attempt to quit: 07/06/2018    Years since quitting: 0.2  . Smokeless tobacco: Never Used  Substance  and Sexual Activity  . Alcohol use: No    Alcohol/week: 0.0 standard drinks  . Drug use: No  . Sexual activity: Not on file

## 2018-10-03 ENCOUNTER — Other Ambulatory Visit: Payer: Self-pay

## 2018-10-03 ENCOUNTER — Ambulatory Visit (INDEPENDENT_AMBULATORY_CARE_PROVIDER_SITE_OTHER): Payer: Medicare Other | Admitting: Orthopedic Surgery

## 2018-10-03 ENCOUNTER — Encounter (INDEPENDENT_AMBULATORY_CARE_PROVIDER_SITE_OTHER): Payer: Self-pay | Admitting: Orthopedic Surgery

## 2018-10-03 VITALS — Ht 70.0 in | Wt 221.0 lb

## 2018-10-03 DIAGNOSIS — Z89511 Acquired absence of right leg below knee: Secondary | ICD-10-CM

## 2018-10-04 ENCOUNTER — Encounter (INDEPENDENT_AMBULATORY_CARE_PROVIDER_SITE_OTHER): Payer: Self-pay | Admitting: Orthopedic Surgery

## 2018-10-04 NOTE — Progress Notes (Signed)
Office Visit Note   Patient: [name]           Date of Birth: 1960/06/12           MRN: [id number] Visit Date: 10/03/2018              Requested by: Chipper Herb, MD [address] [place], [place] [address] PCP: Chipper Herb, MD  Chief Complaint  Patient presents with  . Right Leg - Routine Post Op    07/08/2018 right BKA      HPI: Patient is a 58 year old woman who presents 3 months status post right transtibial amputation she is currently wearing a stump shrinker has a few small scabbed areas she feels like she is healing well.  She is following up with [facility] who is currently making her prosthesis.  Assessment & Plan: Visit Diagnoses:  1. Acquired absence of right lower extremity below knee ([place])     Plan: Recommend patient wear this shrinker below the liner to help decrease the skin dermatitis.  Follow-Up Instructions: Return in about 2 months (around 12/03/2018).   Ortho Exam  Patient is alert, oriented, no adenopathy, well-dressed, normal affect, normal respiratory effort. Examination the residual limb is healing well there is a few small areas of granulation tissue less than 5 mm in diameter.  The scabs were removed.  Patient is showing excellent progress with her healing.  Imaging: No results found.   Labs: Lab Results  Component Value Date   HGBA1C 8.8 (H) 07/04/2018   HGBA1C 8.4 (H) 03/15/2018   HGBA1C 7.6 (H) 09/10/2017   ESRSEDRATE 19 10/25/2012   CRP 0.6 10/25/2012   LABURIC 5.7 12/10/2006   REPTSTATUS 07/09/2018 FINAL 07/04/2018   CULT NO GROWTH 5 DAYS 07/04/2018     Lab Results  Component Value Date   ALBUMIN 2.8 (L) 07/12/2018   ALBUMIN 3.2 (L) 07/04/2018   ALBUMIN 3.5 07/04/2018   LABURIC 5.7 12/10/2006    Body mass index is 31.71 kg/m.  Orders:  No orders of the defined types were placed in this encounter.  No orders of the defined types were placed in this encounter.    Procedures: No procedures performed   Clinical Data: No additional findings.  ROS:  All other systems negative, except as noted in the HPI. Review of Systems  Objective: Vital Signs: Ht 5\' 10"  (1.778 m)   Wt 221 lb (100.2 kg)   BMI 31.71 kg/m   Specialty Comments:  No specialty comments available.  PMFS History: Patient Active Problem List   Diagnosis Date Noted  . Unilateral complete BKA, right, subsequent encounter (Overton)   . S/P unilateral BKA (below knee amputation), right (Santa Barbara)   . Subtherapeutic international normalized ratio (INR)   . Noncompliance   . Leukocytosis   . Acute blood loss anemia   . Diabetes mellitus type 2 in obese (Esparto)   . Unilateral complete BKA, right, initial encounter (Stony River) 07/11/2018  . Acquired absence of right leg below knee (Newark) 07/08/2018  . Mild protein-calorie malnutrition (Humboldt)   . Osteomyelitis of great toe of right foot (Curtis) 07/04/2018  . Intermittent claudication (Bagnell) 05/11/2017  . Deep vein thrombosis (DVT) of both lower extremities (Kiefer) 01/05/2017  . Peripheral vascular insufficiency (Shannon Hills) 09/25/2015  . Hypothyroidism 01/29/2014  . Obesity (BMI 30-39.9) 08/16/2013  . Impetigo 07/28/2013  . Chronic obstructive pulmonary disease (Medicine Lake)   . Malignant neoplasm of uterus (Kenn[place])   . GERD (gastroesophageal reflux disease) 10/18/2012  .  Hernia, hiatal 10/18/2012  . Hyperlipidemia 04/18/2008  . SMOKER 04/18/2008  . Essential hypertension 04/18/2008   Past Medical History:  Diagnosis Date  . COPD (chronic obstructive pulmonary disease) ([place] Hel[place])   . Depression   . DM (diabetes mellitus) (Will[place] Creek)   . DVT (deep venous thrombosis) (H[facility])    x5  . Family history of colon cancer   . GERD (gastroesophageal reflux disease)   . Hyperlipemia   . Hypertension   . Hypothyroid   . Obesity   . PVD (peripheral vascular disease) (Huntington)   . Uterine cancer (Sparland)   . UTI (urinary tract infection)     Family History  Problem Relation Age of Onset  . Colon cancer Brother 57  .  Hypertension Mother   . Hypothyroidism Mother   . Heart disease Father   . Diabetes Father   . Hyperlipidemia Father   . Clotting disorder Brother   . Diabetes Brother   . Heart disease Brother     Past Surgical History:  Procedure Laterality Date  . ABDOMINAL HYSTERECTOMY    . AMPUTATION Right 07/08/2018   Procedure: RIGHT BELOW KNEE AMPUTATION;  Surgeon: [name], MD;  Location: [facility];  Service: Orthopedics;  Laterality: Right;  . CESAREAN SECTION    . FEMORAL BYPASS     x 5  . LUMBAR DISC SURGERY     L4-L5  . TOE AMPUTATION     right  . TONSILLECTOMY     Social History   Occupational History  . Occupation: Product manager: [employer]  Tobacco Use  . Smoking status: Former Smoker    Years: 38.00    Types: Cigarettes    Last attempt to quit: 07/06/2018    Years since quitting: 0.2  . Smokeless tobacco: Never Used  Substance and Sexual Activity  . Alcohol use: No    Alcohol/week: 0.0 standard drinks  . Drug use: No  . Sexual activity: Not on file

## 2018-11-07 ENCOUNTER — Other Ambulatory Visit: Payer: Self-pay | Admitting: Family Medicine

## 2018-11-08 ENCOUNTER — Other Ambulatory Visit: Payer: Self-pay

## 2018-11-08 ENCOUNTER — Telehealth: Payer: Self-pay | Admitting: Family Medicine

## 2018-11-08 ENCOUNTER — Other Ambulatory Visit: Payer: Self-pay | Admitting: *Deleted

## 2018-11-08 DIAGNOSIS — E559 Vitamin D deficiency, unspecified: Secondary | ICD-10-CM

## 2018-11-08 DIAGNOSIS — E039 Hypothyroidism, unspecified: Secondary | ICD-10-CM

## 2018-11-08 DIAGNOSIS — E1152 Type 2 diabetes mellitus with diabetic peripheral angiopathy with gangrene: Secondary | ICD-10-CM

## 2018-11-08 DIAGNOSIS — I10 Essential (primary) hypertension: Secondary | ICD-10-CM

## 2018-11-08 DIAGNOSIS — E7800 Pure hypercholesterolemia, unspecified: Secondary | ICD-10-CM

## 2018-11-08 NOTE — Telephone Encounter (Signed)
Lab orders placed.  

## 2018-11-09 ENCOUNTER — Encounter: Payer: Self-pay | Admitting: Pharmacist Clinician (PhC)/ Clinical Pharmacy Specialist

## 2018-11-09 ENCOUNTER — Ambulatory Visit (INDEPENDENT_AMBULATORY_CARE_PROVIDER_SITE_OTHER): Payer: Medicare Other | Admitting: Pharmacist Clinician (PhC)/ Clinical Pharmacy Specialist

## 2018-11-09 DIAGNOSIS — I10 Essential (primary) hypertension: Secondary | ICD-10-CM | POA: Diagnosis not present

## 2018-11-09 DIAGNOSIS — I82A23 Chronic embolism and thrombosis of axillary vein, bilateral: Secondary | ICD-10-CM | POA: Diagnosis not present

## 2018-11-09 DIAGNOSIS — E7800 Pure hypercholesterolemia, unspecified: Secondary | ICD-10-CM | POA: Diagnosis not present

## 2018-11-09 DIAGNOSIS — E039 Hypothyroidism, unspecified: Secondary | ICD-10-CM

## 2018-11-09 DIAGNOSIS — I749 Embolism and thrombosis of unspecified artery: Secondary | ICD-10-CM

## 2018-11-09 DIAGNOSIS — E1152 Type 2 diabetes mellitus with diabetic peripheral angiopathy with gangrene: Secondary | ICD-10-CM | POA: Diagnosis not present

## 2018-11-09 DIAGNOSIS — E559 Vitamin D deficiency, unspecified: Secondary | ICD-10-CM

## 2018-11-09 LAB — COAGUCHEK XS/INR WAIVED
INR: 2.2 — ABNORMAL HIGH (ref 0.9–1.1)
Prothrombin Time: 25.9 s

## 2018-11-09 LAB — BAYER DCA HB A1C WAIVED: HB A1C (BAYER DCA - WAIVED): 8.7 % — ABNORMAL HIGH (ref <7.0)

## 2018-11-09 MED ORDER — GLIMEPIRIDE 2 MG PO TABS: 2.0000 mg | ORAL_TABLET | Freq: Every day | ORAL | 3 refills | Status: DC

## 2018-11-09 MED ORDER — METFORMIN HCL 1000 MG PO TABS: 1000.0000 mg | ORAL_TABLET | Freq: Two times a day (BID) | ORAL | 3 refills | Status: DC

## 2018-11-09 MED ORDER — METFORMIN HCL 500 MG PO TABS: 500.0000 mg | ORAL_TABLET | Freq: Two times a day (BID) | ORAL | 3 refills | Status: DC

## 2018-11-09 NOTE — Patient Instructions (Signed)
Description   Continue taking 1 1/2 tablets on Mondays, Wednesdays, and Fridays and 1 tablet all other days of the week.  INR today is 2.2  (goal is 1.5-2.5)

## 2018-11-09 NOTE — Progress Notes (Signed)
Continue clinical pharmacist plan for better diabetic control and make sure that patient follows up with blood sugar readings to make sure that control is obtained

## 2018-11-09 NOTE — Progress Notes (Signed)
Patient's A1C came back 8.7% she is taking metformin 1gm at breakfast and 500mg  at dinner and is following a strict diabetic diet and still having hyperglycemia.    Patient can not take any of the newer diabetes medications due to cost constraints.  Thus, will add amaryl 2mg  starting with 1/2 tablet at breakfast for 1 week then increase to 2mg  based on home BG readings.  Increase metformin to 1gm bid.  She is concerned with keeping her A1C less than 7% for wound healing following her right below the knee amputation.  I counseled patient that this would be my goal for her as well.

## 2018-11-10 LAB — THYROID PANEL WITH TSH
Free Thyroxine Index: 2.1 (ref 1.2–4.9)
T3 Uptake Ratio: 28 % (ref 24–39)
T4, Total: 7.6 ug/dL (ref 4.5–12.0)
TSH: 2.37 u[IU]/mL (ref 0.450–4.500)

## 2018-11-10 LAB — BMP8+EGFR
BUN/Creatinine Ratio: 21 (ref 9–23)
BUN: 15 mg/dL (ref 6–24)
CO2: 20 mmol/L (ref 20–29)
Calcium: 9.6 mg/dL (ref 8.7–10.2)
Chloride: 102 mmol/L (ref 96–106)
Creatinine, Ser: 0.73 mg/dL (ref 0.57–1.00)
GFR calc Af Amer: 106 mL/min/{1.73_m2} (ref >59)
GFR calc non Af Amer: 92 mL/min/{1.73_m2} (ref >59)
Glucose: 171 mg/dL — ABNORMAL HIGH (ref 65–99)
Potassium: 4.3 mmol/L (ref 3.5–5.2)
Sodium: 139 mmol/L (ref 134–144)

## 2018-11-10 LAB — CBC WITH DIFFERENTIAL/PLATELET
Basophils Absolute: 0.1 10*3/uL (ref 0.0–0.2)
Basos: 1 %
EOS (ABSOLUTE): 0.2 10*3/uL (ref 0.0–0.4)
Eos: 1 %
Hematocrit: 45.1 % (ref 34.0–46.6)
Hemoglobin: 14.6 g/dL (ref 11.1–15.9)
Immature Grans (Abs): 0 10*3/uL (ref 0.0–0.1)
Immature Granulocytes: 0 %
Lymphocytes Absolute: 4.1 10*3/uL — ABNORMAL HIGH (ref 0.7–3.1)
Lymphs: 38 %
MCH: 27.4 pg (ref 26.6–33.0)
MCHC: 32.4 g/dL (ref 31.5–35.7)
MCV: 85 fL (ref 79–97)
Monocytes Absolute: 0.7 10*3/uL (ref 0.1–0.9)
Monocytes: 6 %
Neutrophils Absolute: 5.8 10*3/uL (ref 1.4–7.0)
Neutrophils: 54 %
Platelets: 247 10*3/uL (ref 150–450)
RBC: 5.33 x10E6/uL — ABNORMAL HIGH (ref 3.77–5.28)
RDW: 14.6 % (ref 11.7–15.4)
WBC: 10.8 10*3/uL (ref 3.4–10.8)

## 2018-11-10 LAB — LIPID PANEL
Chol/HDL Ratio: 4.8 ratio — ABNORMAL HIGH (ref 0.0–4.4)
Cholesterol, Total: 169 mg/dL (ref 100–199)
HDL: 35 mg/dL — ABNORMAL LOW (ref >39)
LDL Calculated: 92 mg/dL (ref 0–99)
Triglycerides: 212 mg/dL — ABNORMAL HIGH (ref 0–149)
VLDL Cholesterol Cal: 42 mg/dL — ABNORMAL HIGH (ref 5–40)

## 2018-11-10 LAB — HEPATIC FUNCTION PANEL
ALT: 16 IU/L (ref 0–32)
AST: 17 IU/L (ref 0–40)
Albumin: 4 g/dL (ref 3.8–4.9)
Alkaline Phosphatase: 64 IU/L (ref 39–117)
Bilirubin Total: 0.2 mg/dL (ref 0.0–1.2)
Bilirubin, Direct: 0.08 mg/dL (ref 0.00–0.40)
Total Protein: 7.2 g/dL (ref 6.0–8.5)

## 2018-11-10 LAB — VITAMIN D 25 HYDROXY (VIT D DEFICIENCY, FRACTURES): Vit D, 25-Hydroxy: 56.8 ng/mL (ref 30.0–100.0)

## 2018-11-11 ENCOUNTER — Other Ambulatory Visit: Payer: Self-pay

## 2018-11-11 DIAGNOSIS — Z89511 Acquired absence of right leg below knee: Secondary | ICD-10-CM

## 2018-11-24 ENCOUNTER — Ambulatory Visit (INDEPENDENT_AMBULATORY_CARE_PROVIDER_SITE_OTHER): Payer: Medicare Other | Admitting: *Deleted

## 2018-11-24 ENCOUNTER — Telehealth: Payer: Self-pay | Admitting: *Deleted

## 2018-11-24 ENCOUNTER — Other Ambulatory Visit: Payer: Self-pay

## 2018-11-24 DIAGNOSIS — Z0000 Encounter for general adult medical examination without abnormal findings: Secondary | ICD-10-CM | POA: Diagnosis not present

## 2018-11-24 NOTE — Telephone Encounter (Signed)
She can try over the counter pepcid in addition to her prilosec. She can try this for 6-8 weeks to see if beneficial. If symptoms persist or get worse, she needs to be evaluated.

## 2018-11-24 NOTE — Telephone Encounter (Signed)
Patient aware.

## 2018-11-24 NOTE — Telephone Encounter (Signed)
Pt states she has been on Prilosec for 10 years and it has controlled her heartburn but it is no longer working and wants to know if there is anything else that can be sent in for her. She did her AWV on the phone with me today. She switched to you for PCP from [facility] and has upcoming appt with you in July. Please advise.

## 2018-11-24 NOTE — Patient Instructions (Signed)
Preventive Care 40-64 Years, Female Preventive care refers to lifestyle choices and visits with your health care provider that can promote health and wellness. What does preventive care include?   A yearly physical exam. This is also called an annual well check.  Dental exams once or twice a year.  Routine eye exams. Ask your health care provider how often you should have your eyes checked.  Personal lifestyle choices, including: ? Daily care of your teeth and gums. ? Regular physical activity. ? Eating a healthy diet. ? Avoiding tobacco and drug use. ? Limiting alcohol use. ? Practicing safe sex. ? Taking low-dose aspirin daily starting at age 50. ? Taking vitamin and mineral supplements as recommended by your health care provider. What happens during an annual well check? The services and screenings done by your health care provider during your annual well check will depend on your age, overall health, lifestyle risk factors, and family history of disease. Counseling Your health care provider may ask you questions about your:  Alcohol use.  Tobacco use.  Drug use.  Emotional well-being.  Home and relationship well-being.  Sexual activity.  Eating habits.  Work and work environment.  Method of birth control.  Menstrual cycle.  Pregnancy history. Screening You may have the following tests or measurements:  Height, weight, and BMI.  Blood pressure.  Lipid and cholesterol levels. These may be checked every 5 years, or more frequently if you are over 50 years old.  Skin check.  Lung cancer screening. You may have this screening every year starting at age 55 if you have a 30-pack-year history of smoking and currently smoke or have quit within the past 15 years.  Colorectal cancer screening. All adults should have this screening starting at age 50 and continuing until age 75. Your health care provider may recommend screening at age 45. You will have tests every  1-10 years, depending on your results and the type of screening test. People at increased risk should start screening at an earlier age. Screening tests may include: ? Guaiac-based fecal occult blood testing. ? Fecal immunochemical test (FIT). ? Stool DNA test. ? Virtual colonoscopy. ? Sigmoidoscopy. During this test, a flexible tube with a tiny camera (sigmoidoscope) is used to examine your rectum and lower colon. The sigmoidoscope is inserted through your anus into your rectum and lower colon. ? Colonoscopy. During this test, a long, thin, flexible tube with a tiny camera (colonoscope) is used to examine your entire colon and rectum.  Hepatitis C blood test.  Hepatitis B blood test.  Sexually transmitted disease (STD) testing.  Diabetes screening. This is done by checking your blood sugar (glucose) after you have not eaten for a while (fasting). You may have this done every 1-3 years.  Mammogram. This may be done every 1-2 years. Talk to your health care provider about when you should start having regular mammograms. This may depend on whether you have a family history of breast cancer.  BRCA-related cancer screening. This may be done if you have a family history of breast, ovarian, tubal, or peritoneal cancers.  Pelvic exam and Pap test. This may be done every 3 years starting at age 21. Starting at age 30, this may be done every 5 years if you have a Pap test in combination with an HPV test.  Bone density scan. This is done to screen for osteoporosis. You may have this scan if you are at high risk for osteoporosis. Discuss your test results, treatment options,   and if necessary, the need for more tests with your health care provider. Vaccines Your health care provider may recommend certain vaccines, such as:  Influenza vaccine. This is recommended every year.  Tetanus, diphtheria, and acellular pertussis (Tdap, Td) vaccine. You may need a Td booster every 10 years.  Varicella  vaccine. You may need this if you have not been vaccinated.  Zoster vaccine. You may need this after age 38.  Measles, mumps, and rubella (MMR) vaccine. You may need at least one dose of MMR if you were born in 1957 or later. You may also need a second dose.  Pneumococcal 13-valent conjugate (PCV13) vaccine. You may need this if you have certain conditions and were not previously vaccinated.  Pneumococcal polysaccharide (PPSV23) vaccine. You may need one or two doses if you smoke cigarettes or if you have certain conditions.  Meningococcal vaccine. You may need this if you have certain conditions.  Hepatitis A vaccine. You may need this if you have certain conditions or if you travel or work in places where you may be exposed to hepatitis A.  Hepatitis B vaccine. You may need this if you have certain conditions or if you travel or work in places where you may be exposed to hepatitis B.  Haemophilus influenzae type b (Hib) vaccine. You may need this if you have certain conditions. Talk to your health care provider about which screenings and vaccines you need and how often you need them. This information is not intended to replace advice given to you by your health care provider. Make sure you discuss any questions you have with your health care provider. Document Released: 06/21/2015 Document Revised: 07/15/2017 Document Reviewed: 03/26/2015 Elsevier Interactive Patient Education  2019 [employer].

## 2018-11-24 NOTE — Progress Notes (Signed)
MEDICARE ANNUAL WELLNESS VISIT  11/24/2018  Telephone Visit Disclaimer This Medicare AWV was conducted by telephone due to national recommendations for restrictions regarding the COVID-19 Pandemic (e.g. social distancing).  I verified, using two identifiers, that I am speaking with [name] or their authorized healthcare agent. I discussed the limitations, risks, security, and privacy concerns of performing an evaluation and management service by telephone and the potential availability of an in-person appointment in the future. The patient expressed understanding and agreed to proceed.   Subjective:  [name] is a 58 y.o. female patient of Rakes, Connye Burkitt, FNP who had a Medicare Annual Wellness Visit today via telephone. [name] is Retired and lives with their spouse. [name] has 1 child. [name] reports that [name] is socially active and does interact with friends/family regularly. [name] is not physically active and enjoys doing [place].  Patient Care Team: Baruch Gouty, FNP as PCP - General (Family Medicine) [name], MD as Consulting Physician (Orthopedic Surgery)  Advanced Directives 11/24/2018 08/16/2018 07/11/2018 07/05/2018 07/23/2015  Does Patient Have a Medical Advance Directive? No No No No No  Would patient like information on creating a medical advance directive? No - Patient declined - No - Patient declined No - Patient declined -    Hospital Utilization Over the Past 12 Months: # of hospitalizations or ER visits: 1 # of surgeries: 1  Review of Systems    Patient reports that her overall health is worse compared to last year.  Patient Reported Readings (BP, Pulse, CBG, Weight, etc) none  Review of Systems: Gastrointestinal ROS: positive for - heartburn  All other systems negative.  Pain Assessment Pain : No/denies pain     Current Medications & Allergies (verified) Allergies as of 11/24/2018      Reactions   Aleve [naproxen Sodium] Hives,  Itching   Cortisone Swelling   Internal organs   Prednisone Swelling   Makes internal organs swell   Vancomycin Itching      Medication List       Accurate as of November 24, 2018 10:05 AM. If you have any questions, ask your nurse or doctor.        STOP taking these medications   doxycycline 100 MG capsule Commonly known as: VIBRAMYCIN   methocarbamol 500 MG tablet Commonly known as: ROBAXIN     TAKE these medications   acetaminophen 325 MG tablet Commonly known as: TYLENOL Take 1-2 tablets (325-650 mg total) by mouth every 4 (four) hours as needed for mild pain.   albuterol 108 (90 Base) MCG/ACT inhaler Commonly known as: VENTOLIN HFA Inhale 2 puffs into the lungs every 6 (six) hours as needed for wheezing or shortness of breath.   aspirin 81 MG EC tablet Take 1 tablet (81 mg total) by mouth daily.   atorvastatin 40 MG tablet Commonly known as: LIPITOR Take 1 tablet (40 mg total) by mouth daily. (Needs to be seen before next refill)   clopidogrel 75 MG tablet Commonly known as: PLAVIX Take 1 tablet (75 mg total) by mouth daily. (Needs to be seen before next refill)   furosemide 20 MG tablet Commonly known as: LASIX Take 1 tablet (20 mg total) by mouth daily. (Needs to be seen before next refill)   glimepiride 2 MG tablet Commonly known as: AMARYL Take 1 tablet (2 mg total) by mouth daily before breakfast.   levothyroxine 100 MCG tablet Commonly known as: SYNTHROID Take 1 tablet (100 mcg total) by  mouth daily before breakfast. (Needs to be seen before next refill)   losartan 50 MG tablet Commonly known as: COZAAR Take 1 tablet (50 mg total) by mouth daily. (Needs to be seen before next refill)   metFORMIN 1000 MG tablet Commonly known as: GLUCOPHAGE Take 1 tablet (1,000 mg total) by mouth 2 (two) times daily with a meal.   omeprazole 20 MG capsule Commonly known as: PRILOSEC Take 1 capsule (20 mg total) by mouth daily. (Needs to be seen before next  refill)   ONE TOUCH ULTRA TEST test strip Generic drug: glucose blood TEST BLOOD SUGARS TWICE A DAY   tretinoin 0.05 % cream Commonly known as: Retin-A Apply topically at bedtime.   triamcinolone cream 0.1 % Commonly known as: KENALOG Apply topically 2 (two) times daily.   Vitamin D3 125 MCG (5000 UT) Tabs Take 1 tablet by mouth daily.   warfarin 5 MG tablet Commonly known as: COUMADIN Take as directed by the anticoagulation clinic. If you are unsure how to take this medication, talk to your nurse or doctor. Original instructions: TAKE AS DIRECTED UP TO 1 & 1/2 TABLETS A DAY       History (reviewed): Past Medical History:  Diagnosis Date  . COPD (chronic obstructive pulmonary disease) ([facility])   . Depression   . DM (diabetes mellitus) ([place])   . DVT (deep venous thrombosis) ([facility])    x5  . Family history of colon cancer   . GERD (gastroesophageal reflux disease)   . Hyperlipemia   . Hypertension   . Hypothyroid   . Obesity   . PVD (peripheral vascular disease) ([place])   . Uterine cancer ([place])   . UTI (urinary tract infection)    Past Surgical History:  Procedure Laterality Date  . ABDOMINAL HYSTERECTOMY    . AMPUTATION Right 07/08/2018   Procedure: RIGHT BELOW KNEE AMPUTATION;  Surgeon: [name], MD;  Location: [facility];  Service: Orthopedics;  Laterality: Right;  . CESAREAN SECTION    . FEMORAL BYPASS     x 5  . LUMBAR DISC SURGERY     L4-L5  . TOE AMPUTATION     right  . TONSILLECTOMY     Family History  Problem Relation Age of Onset  . Colon cancer Brother 24  . Hypertension Mother   . Hypothyroidism Mother   . Heart disease Father   . Diabetes Father   . Hyperlipidemia Father   . Clotting disorder Brother   . Diabetes Brother   . Heart disease Brother    Social History   Socioeconomic History  . Marital status: Married    Spouse name: [name]  . Number of children: 1  . Years of education: 41  . Highest education level: Master's degree (e.g.,  MA, MS, MEng, MEd, MSW, MBA)  Occupational History  . Occupation: Product manager: [employer]  . Occupation: retired  Scientific laboratory technician  . Financial resource strain: Not hard at all  . Food insecurity    Worry: Never true    Inability: Never true  . Transportation needs    Medical: No    Non-medical: No  Tobacco Use  . Smoking status: Current Every Day Smoker    Packs/day: 0.50    Years: 38.00    Pack years: 19.00    Types: Cigarettes  . Smokeless tobacco: Never Used  Substance and Sexual Activity  . Alcohol use: No    Alcohol/week: 0.0 standard drinks  . Drug use:  Yes    Frequency: 4.0 times per week    Types: Marijuana  . Sexual activity: Not Currently    Birth control/protection: Surgical  Lifestyle  . Physical activity    Days per week: 0 days    Minutes per session: 0 min  . Stress: Not at all  Relationships  . Social connections    Talks on phone: More than three times a week    Gets together: More than three times a week    Attends religious service: Never    Active member of club or organization: No    Attends meetings of clubs or organizations: Never    Relationship status: Married  Other Topics Concern  . Not on file  Social History Narrative  . Not on file    Activities of Daily Living In your present state of health, do you have any difficulty performing the following activities: 11/24/2018 07/11/2018  Hearing? N N  Vision? N N  Difficulty concentrating or making decisions? N N  Walking or climbing stairs? Y Y  Comment pt just had BKA right leg -  Dressing or bathing? N N  Doing errands, shopping? Y N  Comment her daughter takes her to all of her appts, and runs errands for her -  Conservation officer, nature and eating ? N -  Using the Toilet? N -  In the past six months, have you accidently leaked urine? N -  Do you have problems with loss of bowel control? N -  Managing your Medications? N -  Managing your Finances? N -  Housekeeping or managing  your Housekeeping? N -  Some recent data might be hidden    Patient Literacy How often do you need to have someone help you when you read instructions, pamphlets, or other written materials from your doctor or pharmacy?: 1 - Never What is the last grade level you completed in school?: Masters Degree-Adult Education  Exercise Current Exercise Habits: The patient does not participate in regular exercise at present, Exercise limited by: orthopedic condition(s)  Diet Patient reports consuming 2 meals a day and 3 snack(s) a day Patient reports that her primary diet is: Diabetic Patient reports that [name] does have regular access to food.   Depression Screen PHQ 2/9 Scores 11/24/2018 08/08/2018 07/27/2018 07/19/2018 07/04/2018 03/14/2018 05/11/2017  PHQ - 2 Score 0 0 0 0 0 0 0  PHQ- 9 Score - - - - - - -     Fall Risk Fall Risk  11/24/2018 08/08/2018 07/27/2018 03/14/2018 05/11/2017  Falls in the past year? 0 0 0 No No  Number falls in past yr: - - - - -  Injury with Fall? - - - - -  Risk for fall due to : Impaired mobility - - - -     Objective:  [name] seemed alert and oriented and [name] participated appropriately during our telephone visit.  Blood Pressure Weight BMI  BP Readings from Last 3 Encounters:  08/16/18 116/80  08/08/18 135/79  07/27/18 107/71   Wt Readings from Last 3 Encounters:  10/03/18 221 lb (100.2 kg)  09/26/18 221 lb (100.2 kg)  09/12/18 221 lb (100.2 kg)   BMI Readings from Last 1 Encounters:  10/03/18 31.71 kg/m    *Unable to obtain current vital signs, weight, and BMI due to telephone visit type  Hearing/Vision  . Chardae did not seem to have difficulty with hearing/understanding during the telephone conversation . Reports that [name] has not had a  formal eye exam by an eye care professional within the past year . Reports that [name] has not had a formal hearing evaluation within the past year *Unable to fully assess hearing and vision during telephone  visit type  Cognitive Function: 6CIT Screen 11/24/2018  What Year? 0 points  What month? 0 points  What time? 0 points  Count back from 20 0 points  Months in reverse 0 points  Repeat phrase 0 points  Total Score 0   (Normal:0-7, Significant for Dysfunction: >8)  Normal Cognitive Function Screening: Yes   Immunization & Health Maintenance Record Immunization History  Administered Date(s) Administered  . Td 06/08/2005    Health Maintenance  Topic Date Due  . Hepatitis C Screening  [birth year]  . PAP SMEAR-Modifier  07/21/1983  . MAMMOGRAM  08/06/2012  . TETANUS/TDAP  06/09/2015  . COLONOSCOPY  07/01/2017  . FOOT EXAM  09/11/2018  . PNEUMOCOCCAL POLYSACCHARIDE VACCINE AGE 12-64 HIGH RISK  03/15/2019 (Originally 07/20/1964)  . HEMOGLOBIN A1C  05/11/2019  . OPHTHALMOLOGY EXAM  05/25/2019  . HIV Screening  Completed  . INFLUENZA VACCINE  Discontinued       Assessment  This is a routine wellness examination for [name].  Health Maintenance: Due or Overdue Health Maintenance Due  Topic Date Due  . Hepatitis C Screening  1960-08-19  . PAP SMEAR-Modifier  07/21/1983  . MAMMOGRAM  08/06/2012  . TETANUS/TDAP  06/09/2015  . COLONOSCOPY  07/01/2017  . FOOT EXAM  09/11/2018    [name] does not need a referral for Community Assistance: Care Management:   no Social Work:    no Prescription Assistance:  no Nutrition/Diabetes Education:  no   Plan:  Personalized Goals Goals Addressed            This Visit's Progress   . Increase physical activity (pt-stated)       "I want to get to walking again after my BKA of the right leg"      Personalized Health Maintenance & Screening Recommendations  Td vaccine Screening mammography Screening Pap smear and pelvic exam  Colorectal cancer screening Shingles vaccine  Lung Cancer Screening Recommended: yes-pt declined at this time (Low Dose CT Chest recommended if Age 3-80 years, 30 pack-year  currently smoking OR have quit w/in past 15 years) Hepatitis C Screening recommended: no HIV Screening recommended: no  Advanced Directives: Written information was not prepared per patient's request.  Referrals & Orders No orders of the defined types were placed in this encounter.   Follow-up Plan . Follow-up with Baruch Gouty, FNP as planned . Schedule Screening Mammogram and Colonoscopy . Consider TDAP and Shingles vaccines at your next visit with your PCP   I have personally reviewed and noted the following in the patient's chart:   . Medical and social history . Use of alcohol, tobacco or illicit drugs  . Current medications and supplements . Functional ability and status . Nutritional status . Physical activity . Advanced directives . List of other physicians . Hospitalizations, surgeries, and ER visits in previous 12 months . Vitals . Screenings to include cognitive, depression, and falls . Referrals and appointments  In addition, I have reviewed and discussed with [name] certain preventive protocols, quality metrics, and best practice recommendations. A written personalized care plan for preventive services as well as general preventive health recommendations is available and can be mailed to the patient at her request.      Ariam Mol, Donny Pique, LPN  11/24/2018      

## 2018-12-05 ENCOUNTER — Encounter: Payer: Self-pay | Admitting: Orthopedic Surgery

## 2018-12-05 ENCOUNTER — Ambulatory Visit (INDEPENDENT_AMBULATORY_CARE_PROVIDER_SITE_OTHER): Payer: Medicare Other | Admitting: Physician Assistant

## 2018-12-05 ENCOUNTER — Other Ambulatory Visit: Payer: Self-pay

## 2018-12-05 VITALS — Ht 70.0 in | Wt 221.0 lb

## 2018-12-05 DIAGNOSIS — E1142 Type 2 diabetes mellitus with diabetic polyneuropathy: Secondary | ICD-10-CM | POA: Diagnosis not present

## 2018-12-05 DIAGNOSIS — I739 Peripheral vascular disease, unspecified: Secondary | ICD-10-CM

## 2018-12-05 DIAGNOSIS — Z89511 Acquired absence of right leg below knee: Secondary | ICD-10-CM | POA: Diagnosis not present

## 2018-12-05 DIAGNOSIS — I749 Embolism and thrombosis of unspecified artery: Secondary | ICD-10-CM

## 2018-12-05 NOTE — Progress Notes (Signed)
Office Visit Note   Patient: [name]           Date of Birth: [birth year]           MRN: [id number] Visit Date: 12/05/2018              Requested by: Chipper Herb, MD [address] [place],  [place] [address] PCP: Baruch Gouty, FNP  Chief Complaint  Patient presents with  . Right Leg - Routine Post Op    07/08/18 right BKA      HPI: The patient is a 58 yo woman who is seen for post operative follow up following a right transtibial amputation on 07/08/2018. She has gotten her prosthesis and has been working with [facility] clinic for adjustments. She has not yet started outpatient PT due to Covid 19 restrictions but will begin later this week. She is pleased with her progress. She is wearing a Vive shrinker stocking wearing her prothesis which she obtained about 1 month ago, and walking with a walker. She reports some minimal phantom pain, but reports it is getting much better and she does not want any medications for this currently. She reports her swelling is much improved with wearing the medical shrinker stocking.   Assessment & Plan: Visit Diagnoses:  1. Acquired absence of right leg below knee ([place])   2. Type 2 diabetes mellitus with diabetic polyneuropathy, without long-term current use of insulin ([place])   3. Peripheral vascular insufficiency ([place])     Plan: She is going to start PT later this week for strengthening and progression with gait. She will follow up in 2 months or sooner if any difficulties in the interim.   Follow-Up Instructions: Return in about 2 months (around 02/04/2019).   Ortho Exam  Patient is alert, oriented, no adenopathy, well-dressed, normal affect, normal respiratory effort. She is walking with a walker and right transtibial prosthesis.  The right transtibial amputation site is well healed with very minimal crusting about the lateral border of the incision. No signs of infection or cellulitis. Full knee extension.   Imaging: No results  found.    Labs: Lab Results  Component Value Date   HGBA1C 8.7 (H) 11/09/2018   HGBA1C 8.8 (H) 07/04/2018   HGBA1C 8.4 (H) 03/15/2018   ESRSEDRATE 19 10/25/2012   CRP 0.6 10/25/2012   LABURIC 5.7 12/10/2006   REPTSTATUS 07/09/2018 FINAL 07/04/2018   CULT NO GROWTH 5 DAYS 07/04/2018     Lab Results  Component Value Date   ALBUMIN 4.0 11/09/2018   ALBUMIN 2.8 (L) 07/12/2018   ALBUMIN 3.2 (L) 07/04/2018   LABURIC 5.7 12/10/2006    Body mass index is 31.71 kg/m.  Orders:  No orders of the defined types were placed in this encounter.  No orders of the defined types were placed in this encounter.    Procedures: No procedures performed  Clinical Data: No additional findings.  ROS:  All other systems negative, except as noted in the HPI. Review of Systems  Objective: Vital Signs: Ht 5\' 10"  (1.778 m)   Wt 221 lb (100.2 kg)   BMI 31.71 kg/m   Specialty Comments:  No specialty comments available.  PMFS History: Patient Active Problem List   Diagnosis Date Noted  . Unilateral complete BKA, right, subsequent encounter ([place])   . S/P unilateral BKA (below knee amputation), right ([place])   . Subtherapeutic international normalized ratio (INR)   . Noncompliance   . Leukocytosis   .  Acute blood loss anemia   . Diabetes mellitus type 2 in obese ([place])   . Unilateral complete BKA, right, initial encounter ([facility]) 07/11/2018  . Acquired absence of right leg below knee ([facility]) 07/08/2018  . Mild protein-calorie malnutrition ([facility])   . Osteomyelitis of great toe of right foot ([place]) 07/04/2018  . Intermittent claudication ([facility]) 05/11/2017  . Deep vein thrombosis (DVT) of both lower extremities ([place]) 01/05/2017  . Peripheral vascular insufficiency ([place]) 09/25/2015  . Hypothyroidism 01/29/2014  . Obesity (BMI 30-39.9) 08/16/2013  . Impetigo 07/28/2013  . Chronic obstructive pulmonary disease ([facility] [facility])   . Malignant neoplasm of uterus ([facility])   . GERD (gastroesophageal reflux  disease) 10/18/2012  . Hernia, hiatal 10/18/2012  . Hyperlipidemia 04/18/2008  . SMOKER 04/18/2008  . Essential hypertension 04/18/2008   Past Medical History:  Diagnosis Date  . COPD (chronic obstructive pulmonary disease) ([facility])   . Depression   . DM (diabetes mellitus) ([facility])   . DVT (deep venous thrombosis) ([facility])    x5  . Family history of colon cancer   . GERD (gastroesophageal reflux disease)   . Hyperlipemia   . Hypertension   . Hypothyroid   . Obesity   . PVD (peripheral vascular disease) ([place])   . Uterine cancer ([facility])   . UTI (urinary tract infection)     Family History  Problem Relation Age of Onset  . Colon cancer Brother 39  . Hypertension Mother   . Hypothyroidism Mother   . Heart disease Father   . Diabetes Father   . Hyperlipidemia Father   . Clotting disorder Brother   . Diabetes Brother   . Heart disease Brother     Past Surgical History:  Procedure Laterality Date  . ABDOMINAL HYSTERECTOMY    . AMPUTATION Right 07/08/2018   Procedure: RIGHT BELOW KNEE AMPUTATION;  Surgeon: [name], MD;  Location: [facility];  Service: Orthopedics;  Laterality: Right;  . CESAREAN SECTION    . FEMORAL BYPASS     x 5  . LUMBAR DISC SURGERY     L4-L5  . TOE AMPUTATION     right  . TONSILLECTOMY     Social History   Occupational History  . Occupation: Product manager: [employer]  . Occupation: retired  Tobacco Use  . Smoking status: Current Every Day Smoker    Packs/day: 0.50    Years: 38.00    Pack years: 19.00    Types: Cigarettes  . Smokeless tobacco: Never Used  Substance and Sexual Activity  . Alcohol use: No    Alcohol/week: 0.0 standard drinks  . Drug use: Yes    Frequency: 4.0 times per week    Types: Marijuana  . Sexual activity: Not Currently    Birth control/protection: Surgical

## 2018-12-07 ENCOUNTER — Other Ambulatory Visit: Payer: Self-pay

## 2018-12-07 ENCOUNTER — Ambulatory Visit: Payer: Medicare Other | Attending: Orthopedic Surgery | Admitting: Physical Therapy

## 2018-12-07 ENCOUNTER — Encounter: Payer: Self-pay | Admitting: Physical Therapy

## 2018-12-07 DIAGNOSIS — R293 Abnormal posture: Secondary | ICD-10-CM | POA: Insufficient documentation

## 2018-12-07 DIAGNOSIS — M6281 Muscle weakness (generalized): Secondary | ICD-10-CM | POA: Insufficient documentation

## 2018-12-07 DIAGNOSIS — R2681 Unsteadiness on feet: Secondary | ICD-10-CM | POA: Diagnosis not present

## 2018-12-07 DIAGNOSIS — R2689 Other abnormalities of gait and mobility: Secondary | ICD-10-CM | POA: Diagnosis not present

## 2018-12-08 NOTE — Therapy (Signed)
[facility] [address] [place], [place], [address] Phone: [phone]   Fax:  [phone]  Physical Therapy Evaluation  Patient Details  Name: [name] MRN: [id number] Date of Birth: 1960-07-08 Referring Provider (PT): [name], MD   Encounter Date: 12/07/2018   CLINIC OPERATION CHANGES: [facility] is open at lower capacity following universal masking, social distancing, and patient screening.  The patient's COVID risk of complications score is 4.   PT End of Session - 12/08/18 [place]    Visit Number  1    Number of Visits  25    Date for PT Re-Evaluation  03/07/19    Authorization Type  Medicare Part A & B    Authorization Time Period  20% co-pay after meeting deductible    PT Start Time  1458    PT Stop Time  1544    PT Time Calculation (min)  46 min    Equipment Utilized During Treatment  Gait belt    Activity Tolerance  Patient tolerated treatment well;Patient limited by fatigue    Behavior During Therapy  WFL for tasks assessed/performed       Past Medical History:  Diagnosis Date  . COPD (chronic obstructive pulmonary disease) ([place])   . Depression   . DM (diabetes mellitus) ([place])   . DVT (deep venous thrombosis) ([facility])    x5  . Family history of colon cancer   . GERD (gastroesophageal reflux disease)   . Hyperlipemia   . Hypertension   . Hypothyroid   . Obesity   . PVD (peripheral vascular disease) ([place])   . Uterine cancer ([place])   . UTI (urinary tract infection)     Past Surgical History:  Procedure Laterality Date  . ABDOMINAL HYSTERECTOMY    . AMPUTATION Right 07/08/2018   Procedure: RIGHT BELOW KNEE AMPUTATION;  Surgeon: [name], MD;  Location: [facility];  Service: Orthopedics;  Laterality: Right;  . CESAREAN SECTION    . FEMORAL BYPASS     x 5  . LUMBAR DISC SURGERY     L4-L5  . TOE AMPUTATION     right  . TONSILLECTOMY      There were no vitals filed  for this visit.   Subjective Assessment - 12/07/18 1501    Subjective  This 58yo female was referred on 11/11/2018 with right Transtibial Amputation by [name], MD. She underwent a right Transtibial Amputation on 07/08/2018 due to osteomyelitis & gangrene of right foot. She received prosthesis 11/16/2018    Pertinent History  R TTA, COPD, depression, DM, HTN, HLD, obesity, uterine CA, L4-5 decompression sg    Patient Stated Goals  She wants to use prosthesis walk, grocery shop, drive, cook standing    Currently in Pain?  No/denies         [facility] PT Assessment - 12/07/18 1500      Assessment   Medical Diagnosis  Right Transtibial Amputation    Referring Provider (PT)  [name], MD    Onset Date/Surgical Date  11/16/18   prosthesis delivery   Hand Dominance  Left    Prior Therapy  none      Precautions   Precautions  Fall      Balance Screen   Has the patient fallen in the past 6 months  No    Has the patient had a decrease in activity level because of a fear of falling?   Yes    Is the  patient reluctant to leave their home because of a fear of falling?   Yes      [place] residence    Living Arrangements  Spouse/significant other   works 12 hrs shifts 6 days/wk   Type of [place] to enter    [employer] of Steps  4    Entrance Stairs-Rails  Right;Left;Cannot reach both    [place]  One level    [place] - 2 wheels;Wheelchair - [employer];Shower seat;Grab bars - tub/shower      Prior Function   Level of Independence  Independent;Independent with gait;Independent with household mobility without device;Independent with community mobility without device    Vocation  On disability    Leisure  cooking, shopping      Posture/Postural Control   Posture/Postural Control  Postural limitations    Postural Limitations  Rounded Shoulders;Forward head;Flexed trunk;Weight shift  left      ROM / Strength   AROM / PROM / Strength  AROM;Strength      AROM   Overall AROM   Within functional limits for tasks performed      Strength   Overall Strength  Deficits    Overall Strength Comments  BUEs grossly 5/5,  LEs tested in sitting & standing with BUE support    Strength Assessment Site  Hip;Knee;Ankle    Right Hip Flexion  5/5    Right Hip Extension  4/5    Right Hip ABduction  4/5    Left Hip Flexion  5/5    Left Hip Extension  4/5    Left Hip ABduction  4/5    Right/Left Knee  Right;Left    Right Knee Flexion  4/5    Right Knee Extension  4/5    Left Knee Flexion  5/5    Left Knee Extension  5/5    Left Ankle Dorsiflexion  5/5      Transfers   Transfers  Sit to Stand;Stand to Sit    Sit to Stand  5: Supervision;With upper extremity assist;With armrests;From chair/3-in-1   requires RW or back of legs against chair to stabilize   Stand to Sit  5: Supervision;With upper extremity assist;With armrests;To chair/3-in-1      Ambulation/Gait   Ambulation/Gait  Yes    Ambulation/Gait Assistance  5: Supervision;4: Min assist   supervision RW & MinA cane   Ambulation/Gait Assistance Details  partial weight bearing on RW; LLE has vascular calf pain limiting distance    Ambulation Distance (Feet)  75 Feet   75' RW seated rest & 75' cane seated rest   Assistive device  Prosthesis;Rolling walker;Straight cane    Gait Pattern  Step-to pattern;Decreased arm swing - right;Decreased step length - left;Decreased stance time - right;Decreased stride length;Decreased hip/knee flexion - right;Decreased weight shift to right;Right hip hike;Antalgic;Lateral hip instability;Trunk flexed;Abducted- right    Ambulation Surface  Indoor;Level    Gait velocity  1.54 ft/sec      Standardized Balance Assessment   Standardized Balance Assessment  Berg Balance Test      Berg Balance Test   Sit to Stand  Needs minimal aid to stand or to stabilize    Standing Unsupported  Able to  stand 2 minutes with supervision    Sitting with Back Unsupported but Feet Supported on Floor or Stool  Able to sit safely and securely 2 minutes    Stand  to Sit  Controls descent by using hands    Transfers  Able to transfer safely, definite need of hands    Standing Unsupported with Eyes Closed  Able to stand 10 seconds with supervision    Standing Unsupported with Feet Together  Able to place feet together independently but unable to hold for 30 seconds    From Standing, Reach Forward with Outstretched Arm  Can reach forward >5 cm safely (2")    From Standing Position, Pick up Object from Floor  Able to pick up shoe, needs supervision    From Standing Position, Turn to Look Behind Over each Shoulder  Turn sideways only but maintains balance    Turn 360 Degrees  Needs assistance while turning    Standing Unsupported, Alternately Place Feet on Step/Stool  Able to complete >2 steps/needs minimal assist    Standing Unsupported, One Foot in Front  Needs help to step but can hold 15 seconds    Standing on One Leg  Tries to lift leg/unable to hold 3 seconds but remains standing independently    Total Score  29    Berg comment:  <45/56 indicates high fall risk, <36/56 indicates dependency in ADL      Prosthetics Assessment - 12/07/18 1700      Prosthetics   Prosthetic Care Dependent with  Skin check;Residual limb care;Care of non-amputated limb;Prosthetic cleaning;Ply sock cleaning;Correct ply sock adjustment;Proper wear schedule/adjustment;Proper weight-bearing schedule/adjustment    Donning prosthesis   Supervision    Doffing prosthesis   Supervision    Current prosthetic wear tolerance (days/week)   17 of 21 days since delivery    Current prosthetic wear tolerance (#hours/day)   5 hrs/day    Current prosthetic weight-bearing tolerance (hours/day)   Patient tolerated 5 minutes of standing with partial weight on prosthesis with no limb pain. LLE vascular calf pain 7/10 with gait.     Edema   pitting edema with 4 sec capillary refill,    Residual limb condition   lateral incision wound 3cm X 1.5cm with scab covering and 2 small dry scabs.  bulbous edematous shape. dry skin. Normal temperature.                Objective measurements completed on examination: See above findings.      [facility] Adult PT Treatment/Exercise - 12/07/18 1700      Prosthetics   Prosthetic Care Comments   she is wearing black Vive shrinker under liner per Dr Sharol Given until her wound heals.  Increase wear to 4hrs 2x/day with minimum of 2 hrs off between wears.  Plan to increase 1hr/wear weekly if no increases in wound or limb pain.     Education Provided  Skin check;Residual limb care;Prosthetic cleaning;Care of non-amputated limb;Correct ply sock adjustment;Ply sock cleaning;Proper Donning;Proper Doffing;Proper wear schedule/adjustment;Proper weight-bearing schedule/adjustment    Person(s) Educated  Patient    Education Method  Explanation;Demonstration;Tactile cues;Verbal cues    Education Method  Verbalized understanding;Returned demonstration;Tactile cues required;Verbal cues required;Needs further instruction               PT Short Term Goals - 12/08/18 2205      PT SHORT TERM GOAL #1   Title  Patient demonstrates proper donning & verbalizes proper cleaning of prosthesis. (All STGs Target Date: 01/06/2019)    Time  4    Period  Weeks    Status  New    Target Date  01/06/19      PT SHORT TERM GOAL #2  Title  Patient tolerates prosthesis wear >12hrs total/day with no increase in wound size    Time  4    Period  Weeks    Status  New    Target Date  01/06/19      PT SHORT TERM GOAL #3   Title  Patient ambulates 250' with cane & prosthesis with minA.    Time  4    Period  Weeks    Status  New    Target Date  01/06/19      PT SHORT TERM GOAL #4   Title  Patient negotiates stairs single rail, ramps & curbs with cane & prosthesis with minA.    Time  4    Period  Weeks    Status   New    Target Date  01/06/19      PT SHORT TERM GOAL #5   Title  Patient lifts 10# box from floor with supervision.    Time  4    Period  Weeks    Status  New    Target Date  01/06/19        PT Long Term Goals - 12/08/18 2157      PT LONG TERM GOAL #1   Title  Patient demonstrates & verbalizes proper prosthetic care to enable safe utilization of prosthesis. (All LTGs Target Date: 03/03/2019)    Time  12    Period  Weeks    Status  New    Target Date  12/31/18      PT LONG TERM GOAL #2   Title  Patient tolerates wear of prosthesis >90% of awake hours without skin or limb pain issues to enable function throughout her day.    Time  12    Period  Weeks    Status  New    Target Date  12/31/18      PT LONG TERM GOAL #3   Title  Berg Balance >/= 45/56 to indicate lower fall risk.    Time  12    Period  Weeks    Status  New    Target Date  12/31/18      PT LONG TERM GOAL #4   Title  Patient ambulates 500 outdoors including grass' with cane or less and prosthesis modified independent to enable community mobility.    Time  12    Period  Weeks    Status  New    Target Date  12/31/18      PT LONG TERM GOAL #5   Title  Patient negotiates ramps, curbs and stairs single rail with cane or less & prosthesis to enable community access.    Time  12    Period  Weeks    Status  New    Target Date  12/31/18      PT LONG TERM GOAL #6   Title  Patient ambulates around furniture carrying household items with prosthesis only modified independent.    Time  12    Period  Weeks    Status  New    Target Date  12/31/18      PT LONG TERM GOAL #7   Title  Dynamic Gait Index with cane & prosthesis >16/24.    Time  12    Period  Weeks    Status  New    Target Date  12/31/18             Plan - 12/08/18 2147    Clinical Impression  Statement  This 58yo female underwent a right Transtibial Amputation on 07/08/2018 and has been w/c bound since that time. She has deconditioning with  weakness & impaired endurance due to sedentariness with w/c bound. She received her first prosthesis 21 days prior to PT evaluation and is dependent in proper care & use. She wears prosthesis 5hrs 1x/day which limits ability to function most of her awake hours. Berg Balance Test 29/56 indicates high fall risk & dependency in standing ADLs. Her prosthetic gait is dependent on BUEs support, gait deviations and gait velocity 1.54 ft/sec indicate high fall risk & limited mobility.  Patient would benefit from skilled PT to improve mobility & safety.    Personal Factors and Comorbidities  Comorbidity 3+;Fitness;Past/Current Experience;Time since onset of injury/illness/exacerbation    Comorbidities  R TTA, COPD, depression, DM, HTN, HLD, obesity, uterine CA, L4-5 decompression sg    Examination-Activity Limitations  Caring for Others;Locomotion Level;Reach Overhead;Stairs;Stand;Transfers    Examination-Participation Restrictions  Community Activity;Driving;Meal Prep;Yard Work;Volunteer       Patient will benefit from skilled therapeutic intervention in order to improve the following deficits and impairments:  Abnormal gait, Decreased activity tolerance, Decreased balance, Decreased endurance, Decreased knowledge of use of DME, Decreased mobility, Decreased strength, Decreased skin integrity, Impaired flexibility, Postural dysfunction, Prosthetic Dependency  Visit Diagnosis: 1. Other abnormalities of gait and mobility   2. Muscle weakness (generalized)   3. Unsteadiness on feet   4. Abnormal posture        Problem List Patient Active Problem List   Diagnosis Date Noted  . Unilateral complete BKA, right, subsequent encounter ([place])   . S/P unilateral BKA (below knee amputation), right ([place])   . Subtherapeutic international normalized ratio (INR)   . Noncompliance   . Leukocytosis   . Acute blood loss anemia   . Diabetes mellitus type 2 in obese ([place])   . Unilateral complete BKA, right, initial  encounter ([place]) 07/11/2018  . Acquired absence of right leg below knee ([place]) 07/08/2018  . Mild protein-calorie malnutrition ([place])   . Osteomyelitis of great toe of right foot ([place]) 07/04/2018  . Intermittent claudication ([place]) 05/11/2017  . Deep vein thrombosis (DVT) of both lower extremities ([place]) 01/05/2017  . Peripheral vascular insufficiency ([place]) 09/25/2015  . Hypothyroidism 01/29/2014  . Obesity (BMI 30-39.9) 08/16/2013  . Impetigo 07/28/2013  . Chronic obstructive pulmonary disease ([place])   . Malignant neoplasm of uterus ([place])   . GERD (gastroesophageal reflux disease) 10/18/2012  . Hernia, hiatal 10/18/2012  . Hyperlipidemia 04/18/2008  . SMOKER 04/18/2008  . Essential hypertension 04/18/2008    Jamey Reas PT, DPT 12/08/2018, 10:10 PM  [facility] [address] [place], [place], [address] Phone: [phone]   Fax:  [phone]  Name: [name] MRN: [id number] Date of Birth: [birth year]

## 2018-12-12 ENCOUNTER — Ambulatory Visit: Payer: Medicare Other | Admitting: Physical Therapy

## 2018-12-12 ENCOUNTER — Encounter: Payer: Self-pay | Admitting: Physical Therapy

## 2018-12-12 ENCOUNTER — Other Ambulatory Visit: Payer: Self-pay

## 2018-12-12 DIAGNOSIS — M6281 Muscle weakness (generalized): Secondary | ICD-10-CM

## 2018-12-12 DIAGNOSIS — R293 Abnormal posture: Secondary | ICD-10-CM | POA: Diagnosis not present

## 2018-12-12 DIAGNOSIS — R2681 Unsteadiness on feet: Secondary | ICD-10-CM | POA: Diagnosis not present

## 2018-12-12 DIAGNOSIS — R2689 Other abnormalities of gait and mobility: Secondary | ICD-10-CM | POA: Diagnosis not present

## 2018-12-13 NOTE — Therapy (Signed)
[facility] [address] [place], [place], [address] Phone: [phone]   Fax:  [phone]  Physical Therapy Treatment  Patient Details  Name: [name] MRN: [id number] Date of Birth: [birth year] Referring Provider (PT): [name], MD   Encounter Date: 12/12/2018   CLINIC OPERATION CHANGES: [facility] is open at lower capacity following universal masking, social distancing, and patient screening.  The patient's COVID risk of complications score is 4.   PT End of Session - 12/12/18 1218    Visit Number  2    Number of Visits  25    Date for PT Re-Evaluation  03/07/19    Authorization Type  Medicare Part A & B    Authorization Time Period  20% co-pay after meeting deductible    PT Start Time  1057    PT Stop Time  1152    PT Time Calculation (min)  55 min    Equipment Utilized During Treatment  Gait belt    Activity Tolerance  Patient tolerated treatment well;Patient limited by fatigue    Behavior During Therapy  WFL for tasks assessed/performed       Past Medical History:  Diagnosis Date  . COPD (chronic obstructive pulmonary disease) ([facility])   . Depression   . DM (diabetes mellitus) ([place])   . DVT (deep venous thrombosis) ([facility])    x5  . Family history of colon cancer   . GERD (gastroesophageal reflux disease)   . Hyperlipemia   . Hypertension   . Hypothyroid   . Obesity   . PVD (peripheral vascular disease) ([place])   . Uterine cancer ([place])   . UTI (urinary tract infection)     Past Surgical History:  Procedure Laterality Date  . ABDOMINAL HYSTERECTOMY    . AMPUTATION Right 07/08/2018   Procedure: RIGHT BELOW KNEE AMPUTATION;  Surgeon: [name], MD;  Location: [facility];  Service: Orthopedics;  Laterality: Right;  . CESAREAN SECTION    . FEMORAL BYPASS     x 5  . LUMBAR DISC SURGERY     L4-L5  . TOE AMPUTATION     right  . TONSILLECTOMY      There were no vitals filed  for this visit.  Subjective Assessment - 12/12/18 1056    Subjective  She wore prosthesis 4 hrs 2x/day except yesterday because she was sick with her hiatal hernia.    Pertinent History  R TTA, COPD, depression, DM, HTN, HLD, obesity, uterine CA, L4-5 decompression sg    Patient Stated Goals  She wants to use prosthesis walk, grocery shop, drive, cook standing    Currently in Pain?  No/denies                       [facility] Adult PT Treatment/Exercise - 12/12/18 1100      Transfers   Transfers  Sit to Stand;Stand to Sit    Sit to Stand  5: Supervision;With upper extremity assist;From chair/3-in-1   chairs without armrests using UEs   Sit to Stand Details  Visual cues/gestures for sequencing;Verbal cues for sequencing;Verbal cues for technique    Stand to Sit  5: Supervision;With upper extremity assist;To chair/3-in-1   chairs without armrests using UEs   Stand to Sit Details (indicate cue type and reason)  Visual cues/gestures for sequencing;Verbal cues for sequencing;Verbal cues for technique      Ambulation/Gait   Ambulation/Gait  Yes    Ambulation/Gait Assistance  5: Supervision    Ambulation/Gait Assistance Details  worked on proper step width (not abducting) using visual line on floor, progressing to visual & proprioceptive, to only proprioceptive. Pt improved with skilled instructions.  PT also verbal cues on upright posture.     Ambulation Distance (Feet)  200 Feet   200' X 2   Assistive device  Straight cane;Prosthesis    Gait Pattern  Step-through pattern;Decreased arm swing - right;Decreased step length - left;Decreased stance time - right;Decreased hip/knee flexion - right;Antalgic;Lateral hip instability;Abducted- right    Ambulation Surface  Level;Indoor    Stairs  Yes    Stairs Assistance  5: Supervision    Stairs Assistance Details (indicate cue type and reason)  tactile & verbal cues on wt shift over prosthesis in stance, step width & wt shift over stance  limb prior to stepping LLE    Stair Management Technique  One rail Right;One rail Left;With cane;Step to pattern;Forwards    Number of Stairs  4   3 reps varying rail/cane location   Height of Stairs  6    Ramp  4: Min assist   cane & TTA prosthesis   Ramp Details (indicate cue type and reason)  demo, tactile & verbal cues on upright posture, sequence & wt shift    Curb  4: Min assist   cane & TTA prosthesis   Curb Details (indicate cue type and reason)  demo, tactile & verbal cues on technique, sequence & step thru 2nd LE for balance      High Level Balance   High Level Balance Activities  Head turns    High Level Balance Comments  tactile & verbal cues on maintaining path & pace with head turns right/left & up/down.       Self-Care   Self-Care  Lifting    Lifting  PT demo & verbal cues on proper technique to pick up items from floor with TTA prosthesis to maintain foot flat. Pt return demo 3 reps with verbal cues & no LOB.       Prosthetics   Prosthetic Care Comments   PT educated on need for consistent wear to minimize skin issues & as she starts weighting prosthesis subconsciously not wearing when out of bed increases her fall risk.   PT instructed in limb & phantom pain can be related to temperature (cold increases pain) & fluid retention / salt intake requires more adjustment of ply socks to manage proper fit. Prosthesis creates a pumping action with weighting/unweighting pushing some fluid out of limb & need to adjust ply socks.   per Pt request, PT educated on 4 options for driving with right TTA prosthesis with recommendation to practice in large empty parking lot.     Current prosthetic wear tolerance (days/week)   daily since evaluation    Current prosthetic wear tolerance (#hours/day)   4 hrs 2x/day except Sunday when not filling well    Current prosthetic weight-bearing tolerance (hours/day)   Patient tolerated 7 minutes of standing & gait with partial weight on prosthesis with  no limb pain. LLE vascular calf pain 5/10 with gait.     Edema  pitting edema with 4 sec capillary refill,    Residual limb condition   lateral incision is smaller today with 2cm X 1cm scab that has 28mm area that appears deeper & moist.  PT used tweezers to remove other 3 small dry scabs with no bleeding present.      Education Provided  Skin  check;Residual limb care;Correct ply sock adjustment;Proper Donning;Proper wear schedule/adjustment;Other (comment)   se prosthetic care comments   Person(s) Educated  Patient    Education Method  Explanation;Demonstration;Tactile cues;Verbal cues    Education Method  Verbalized understanding;Returned demonstration;Tactile cues required;Verbal cues required;Needs further instruction    Donning Prosthesis  Supervision               PT Short Term Goals - 12/13/18 0749      PT SHORT TERM GOAL #1   Title  Patient demonstrates proper donning & verbalizes proper cleaning of prosthesis. (All STGs Target Date: 01/06/2019)    Time  4    Period  Weeks    Status  On-going    Target Date  01/06/19      PT SHORT TERM GOAL #2   Title  Patient tolerates prosthesis wear >12hrs total/day with no increase in wound size    Time  4    Period  Weeks    Status  On-going    Target Date  01/06/19      PT SHORT TERM GOAL #3   Title  Patient ambulates 250' with cane & prosthesis with minA.    Time  4    Period  Weeks    Status  On-going    Target Date  01/06/19      PT SHORT TERM GOAL #4   Title  Patient negotiates stairs single rail, ramps & curbs with cane & prosthesis with minA.    Time  4    Period  Weeks    Status  On-going    Target Date  01/06/19      PT SHORT TERM GOAL #5   Title  Patient lifts 10# box from floor with supervision.    Time  4    Period  Weeks    Status  On-going    Target Date  01/06/19        PT Long Term Goals - 12/13/18 0749      PT LONG TERM GOAL #1   Title  Patient demonstrates & verbalizes proper prosthetic care  to enable safe utilization of prosthesis. (All LTGs Target Date: 03/03/2019)    Time  12    Period  Weeks    Status  On-going    Target Date  03/03/19      PT LONG TERM GOAL #2   Title  Patient tolerates wear of prosthesis >90% of awake hours without skin or limb pain issues to enable function throughout her day.    Time  12    Period  Weeks    Status  On-going    Target Date  03/03/19      PT LONG TERM GOAL #3   Title  Berg Balance >/= 45/56 to indicate lower fall risk.    Time  12    Period  Weeks    Status  On-going    Target Date  03/03/19      PT LONG TERM GOAL #4   Title  Patient ambulates 500 outdoors including grass' with cane or less and prosthesis modified independent to enable community mobility.    Time  12    Period  Weeks    Status  On-going    Target Date  03/03/19      PT LONG TERM GOAL #5   Title  Patient negotiates ramps, curbs and stairs single rail with cane or less & prosthesis to enable community access.    Time  12    Period  Weeks    Status  On-going    Target Date  03/03/19      PT LONG TERM GOAL #6   Title  Patient ambulates around furniture carrying household items with prosthesis only modified independent.    Time  12    Period  Weeks    Status  On-going    Target Date  03/03/19      PT LONG TERM GOAL #7   Title  Dynamic Gait Index with cane & prosthesis >16/24.    Time  12    Period  Weeks    Status  On-going    Target Date  03/03/19            Plan - 12/12/18 1755    Clinical Impression Statement  PT educated patient on prosthetic care & use issues and she seems to understand general concepts.  PT also worked on prosthetic gait with cane including stairs, ramps & curbs. Patient has better understanding.    Personal Factors and Comorbidities  Comorbidity 3+;Fitness;Past/Current Experience;Time since onset of injury/illness/exacerbation    Comorbidities  R TTA, COPD, depression, DM, HTN, HLD, obesity, uterine CA, L4-5 decompression  sg    Examination-Activity Limitations  Caring for Others;Locomotion Level;Reach Overhead;Stairs;Stand;Transfers    Examination-Participation Restrictions  Community Activity;Driving;Meal Prep;Yard Work;Volunteer    PT Frequency  2x / week    PT Duration  12 weeks    PT Treatment/Interventions  ADLs/Self Care Home Management;Gait training;Stair training;Functional mobility training;Therapeutic activities;Therapeutic exercise;Balance training;Neuromuscular re-education;Patient/family education;Prosthetic Training;Vestibular    PT Next Visit Plan  Continue with prosthetic education, gait & balance towards STGs, Transfer plan of care to our [facility] clinic next week with this prosthetic PT specialist available for consult    Consulted and Agree with Plan of Care  Patient       Patient will benefit from skilled therapeutic intervention in order to improve the following deficits and impairments:  Abnormal gait, Decreased activity tolerance, Decreased balance, Decreased endurance, Decreased knowledge of use of DME, Decreased mobility, Decreased strength, Decreased skin integrity, Impaired flexibility, Postural dysfunction, Prosthetic Dependency  Visit Diagnosis: 1. Other abnormalities of gait and mobility   2. Muscle weakness (generalized)   3. Unsteadiness on feet   4. Abnormal posture        Problem List Patient Active Problem List   Diagnosis Date Noted  . Unilateral complete BKA, right, subsequent encounter ([place])   . S/P unilateral BKA (below knee amputation), right ([place])   . Subtherapeutic international normalized ratio (INR)   . Noncompliance   . Leukocytosis   . Acute blood loss anemia   . Diabetes mellitus type 2 in obese ([place])   . Unilateral complete BKA, right, initial encounter ([place]) 07/11/2018  . Acquired absence of right leg below knee ([place]) 07/08/2018  . Mild protein-calorie malnutrition ([place])   . Osteomyelitis of great toe of right foot ([place]) 07/04/2018  . Intermittent  claudication ([place]) 05/11/2017  . Deep vein thrombosis (DVT) of both lower extremities ([place]) 01/05/2017  . Peripheral vascular insufficiency ([place]) 09/25/2015  . Hypothyroidism 01/29/2014  . Obesity (BMI 30-39.9) 08/16/2013  . Impetigo 07/28/2013  . Chronic obstructive pulmonary disease ([place])   . Malignant neoplasm of uterus ([place])   . GERD (gastroesophageal reflux disease) 10/18/2012  . Hernia, hiatal 10/18/2012  . Hyperlipidemia 04/18/2008  . SMOKER 04/18/2008  . Essential hypertension 04/18/2008    Jamey Reas  PT, DPT 12/13/2018, 7:59 AM  [facility]  [address] [place], [place], [address] Phone: [phone]   Fax:  [phone]  Name: [name] MRN: [id number] Date of Birth: 1960-06-19

## 2018-12-15 ENCOUNTER — Ambulatory Visit: Payer: Medicare Other | Admitting: Physical Therapy

## 2018-12-15 ENCOUNTER — Encounter: Payer: Self-pay | Admitting: Physical Therapy

## 2018-12-15 ENCOUNTER — Other Ambulatory Visit: Payer: Self-pay

## 2018-12-15 DIAGNOSIS — R2689 Other abnormalities of gait and mobility: Secondary | ICD-10-CM | POA: Diagnosis not present

## 2018-12-15 DIAGNOSIS — R293 Abnormal posture: Secondary | ICD-10-CM

## 2018-12-15 DIAGNOSIS — R2681 Unsteadiness on feet: Secondary | ICD-10-CM | POA: Diagnosis not present

## 2018-12-15 DIAGNOSIS — M6281 Muscle weakness (generalized): Secondary | ICD-10-CM | POA: Diagnosis not present

## 2018-12-16 NOTE — Patient Instructions (Signed)
Sweating increases with an amputation. Your body is trying to regulate your temperature & without an extremity, you sweat more easily to cool off. Also prosthetic material like liners do not breath and add hot layers which causes even more sweating. With time your body typically will accommodate to prosthesis and your sweat level will come closer to level with amputation but not pre-amputation level.   You need to pat your limb & liner dry when you notice sweating. If you leave sweat trapped inside your liner, then it can result in a blister.   Signs of sweating in your liner: 1. You are sweating elsewhere on your body or you notice sweat running / dripping.  2. Take note of how high your liner comes up on your limb when you first put your liner on your limb. If you notice that your liner has slipped down, then you probably have sweat inside your liner. A good time to check for liner slippage is when toileting.  3. You feel air bubbles inside your liner. When you liner slips, then air is allowed in bottom. As you put weight on prosthesis, the air is burp or pushed out. 4. You feel something crawling or moving inside your liner. When sweat runs inside the closed system of liner, it often feels like a bug or something crawling inside your liner.  If any of above symptoms are noted, you need to remove your prosthesis & liner to pat your limb & liner dry. This is permanent need as leaving sweat or water trapped can result in a blister or wound.

## 2018-12-16 NOTE — Therapy (Signed)
[facility] [address] [place], [place], [address] Phone: [phone]   Fax:  [phone]  Physical Therapy Treatment  Patient Details  Name: [name] MRN: [id number] Date of Birth: 07-Oct-1960 Referring Provider (PT): [name], MD   Encounter Date: 12/15/2018   CLINIC OPERATION CHANGES: [facility] is open at lower capacity following universal masking, social distancing, and patient screening.  The patient's COVID risk of complications score is 4.   PT End of Session - 12/15/18 1755    Visit Number  3    Number of Visits  25    Date for PT Re-Evaluation  03/07/19    Authorization Type  Medicare Part A & B    Authorization Time Period  20% co-pay after meeting deductible    PT Start Time  1655    PT Stop Time  1750    PT Time Calculation (min)  55 min    Equipment Utilized During Treatment  Gait belt    Activity Tolerance  Patient tolerated treatment well;Patient limited by fatigue    Behavior During Therapy  WFL for tasks assessed/performed       Past Medical History:  Diagnosis Date  . COPD (chronic obstructive pulmonary disease) ([place])   . Depression   . DM (diabetes mellitus) ([place])   . DVT (deep venous thrombosis) ([facility])    x5  . Family history of colon cancer   . GERD (gastroesophageal reflux disease)   . Hyperlipemia   . Hypertension   . Hypothyroid   . Obesity   . PVD (peripheral vascular disease) ([place])   . Uterine cancer ([place])   . UTI (urinary tract infection)     Past Surgical History:  Procedure Laterality Date  . ABDOMINAL HYSTERECTOMY    . AMPUTATION Right 07/08/2018   Procedure: RIGHT BELOW KNEE AMPUTATION;  Surgeon: [name], MD;  Location: [facility];  Service: Orthopedics;  Laterality: Right;  . CESAREAN SECTION    . FEMORAL BYPASS     x 5  . LUMBAR DISC SURGERY     L4-L5  . TOE AMPUTATION     right  . TONSILLECTOMY      There were no vitals filed  for this visit.  Subjective Assessment - 12/15/18 1658    Subjective  no falls. She is wearing prosthesis 4hrs 2x/day and feels like scab is same size.    Pertinent History  R TTA, COPD, depression, DM, HTN, HLD, obesity, uterine CA, L4-5 decompression sg    Patient Stated Goals  She wants to use prosthesis walk, grocery shop, drive, cook standing    Currently in Pain?  No/denies                       [facility] Adult PT Treatment/Exercise - 12/16/18 0001      Transfers   Transfers  Sit to Stand;Stand to Sit    Sit to Stand  5: Supervision;With upper extremity assist;From chair/3-in-1   chairs without armrests using UEs   Sit to Stand Details  Visual cues/gestures for sequencing;Verbal cues for sequencing;Verbal cues for technique    Stand to Sit  5: Supervision;With upper extremity assist;To chair/3-in-1   chairs without armrests using UEs   Stand to Sit Details (indicate cue type and reason)  Visual cues/gestures for sequencing;Verbal cues for sequencing;Verbal cues for technique      Ambulation/Gait   Ambulation/Gait  Yes    Ambulation/Gait Assistance  5:  Supervision    Ambulation/Gait Assistance Details  visual, tactile & verbal cues on proper step width (not abducting), wt shift over prosthesis in stance & upright posture    Ambulation Distance (Feet)  200 Feet   200' X 2   Assistive device  Straight cane;Prosthesis    Gait Pattern  Step-through pattern;Decreased arm swing - right;Decreased step length - left;Decreased stance time - right;Decreased hip/knee flexion - right;Antalgic;Lateral hip instability;Abducted- right    Ambulation Surface  Indoor;Level    Stairs  Yes    Stairs Assistance  5: Supervision    Stairs Assistance Details (indicate cue type and reason)  tactile & verbal cues on technique with TTA prosthesis    Stair Management Technique  One rail Right;One rail Left;With cane;Step to pattern;Forwards    Number of Stairs  4   3 reps varying rail/cane  location   Height of Stairs  6    Ramp  4: Min assist   cane & TTA prosthesis   Ramp Details (indicate cue type and reason)  tactile & verbal cues on technique with TTA prosthesis with upright posture & wt shift over toes ascending / heels descending    Curb  4: Min assist   cane & TTA prosthesis   Curb Details (indicate cue type and reason)  demo, verbal & tactile cues on technique including sequence, wt shift & step thru for stability      High Level Balance   High Level Balance Activities  Head turns;Negotiating over obstacles    High Level Balance Comments  PT demo & verbal cues on technique with TTA prosthesis to step over obstacles (step over with prosthesis first to enable vision for clearance) Pt return demo understanding with cane & prosthesis with minA.       Self-Care   Self-Care  Lifting    Lifting  PT demo & verbal cues on proper technique to pick up items from floor with TTA prosthesis to maintain foot flat. Pt return demo 3 reps with verbal cues & no LOB.       Prosthetics   Prosthetic Care Comments   Progress wear weekly if no issues (5hrs 2x/day this week, then 6hrs 2x/day with 2 hrs off between wear, all awake hours (~14 per her report) with 1 hr off for 1 week,  next week all awake hours drying 2x/day or prn, then following week drying 1x/day or prn and then all awake hrs drying prn.  PT instructed in sweat management with need to pat limb & liner dry to minimize risk of blisters, use of antiperspirant Secret [employer] nightly & Sweat Block weekly, signs of sweating.      Current prosthetic wear tolerance (days/week)   daily since evaluation    Current prosthetic wear tolerance (#hours/day)   increase to 5hrs 2x/day    Current prosthetic weight-bearing tolerance (hours/day)   Patient tolerated 10 minutes of standing & gait with partial weight on prosthesis with no limb pain. LLE vascular calf pain 5/10 with gait.     Edema  pitting edema with 4 sec capillary refill,     Residual limb condition   lateral incision is smaller today with 2cm X 0.7cm scab that now appears dry, no other openings.     Education Provided  Skin check;Residual limb care;Correct ply sock adjustment;Proper Donning;Proper wear schedule/adjustment;Other (comment);Prosthetic cleaning   se prosthetic care comments   Person(s) Educated  Patient    Education Method  Explanation;Demonstration;Tactile cues;Verbal cues  Education Method  Verbalized understanding;Needs further instruction;Verbal cues required    Donning Prosthesis  Supervision               PT Short Term Goals - 12/13/18 0749      PT SHORT TERM GOAL #1   Title  Patient demonstrates proper donning & verbalizes proper cleaning of prosthesis. (All STGs Target Date: 01/06/2019)    Time  4    Period  Weeks    Status  On-going    Target Date  01/06/19      PT SHORT TERM GOAL #2   Title  Patient tolerates prosthesis wear >12hrs total/day with no increase in wound size    Time  4    Period  Weeks    Status  On-going    Target Date  01/06/19      PT SHORT TERM GOAL #3   Title  Patient ambulates 250' with cane & prosthesis with minA.    Time  4    Period  Weeks    Status  On-going    Target Date  01/06/19      PT SHORT TERM GOAL #4   Title  Patient negotiates stairs single rail, ramps & curbs with cane & prosthesis with minA.    Time  4    Period  Weeks    Status  On-going    Target Date  01/06/19      PT SHORT TERM GOAL #5   Title  Patient lifts 10# box from floor with supervision.    Time  4    Period  Weeks    Status  On-going    Target Date  01/06/19        PT Long Term Goals - 12/13/18 0749      PT LONG TERM GOAL #1   Title  Patient demonstrates & verbalizes proper prosthetic care to enable safe utilization of prosthesis. (All LTGs Target Date: 03/03/2019)    Time  12    Period  Weeks    Status  On-going    Target Date  03/03/19      PT LONG TERM GOAL #2   Title  Patient tolerates wear of  prosthesis >90% of awake hours without skin or limb pain issues to enable function throughout her day.    Time  12    Period  Weeks    Status  On-going    Target Date  03/03/19      PT LONG TERM GOAL #3   Title  Berg Balance >/= 45/56 to indicate lower fall risk.    Time  12    Period  Weeks    Status  On-going    Target Date  03/03/19      PT LONG TERM GOAL #4   Title  Patient ambulates 500 outdoors including grass' with cane or less and prosthesis modified independent to enable community mobility.    Time  12    Period  Weeks    Status  On-going    Target Date  03/03/19      PT LONG TERM GOAL #5   Title  Patient negotiates ramps, curbs and stairs single rail with cane or less & prosthesis to enable community access.    Time  12    Period  Weeks    Status  On-going    Target Date  03/03/19      PT LONG TERM GOAL #6   Title  Patient ambulates  around furniture carrying household items with prosthesis only modified independent.    Time  12    Period  Weeks    Status  On-going    Target Date  03/03/19      PT LONG TERM GOAL #7   Title  Dynamic Gait Index with cane & prosthesis >16/24.    Time  12    Period  Weeks    Status  On-going    Target Date  03/03/19            Plan - 12/15/18 1846    Clinical Impression Statement  Patient's wound appears to be healing. She appears to understand sweat management. She improved prosthetic gait with cane including negotiating ramps & curbs and stepping over obstacles.    Personal Factors and Comorbidities  Comorbidity 3+;Fitness;Past/Current Experience;Time since onset of injury/illness/exacerbation    Comorbidities  R TTA, COPD, depression, DM, HTN, HLD, obesity, uterine CA, L4-5 decompression sg    Examination-Activity Limitations  Caring for Others;Locomotion Level;Reach Overhead;Stairs;Stand;Transfers    Examination-Participation Restrictions  Community Activity;Driving;Meal Prep;Yard Work;Volunteer    PT Frequency  2x /  week    PT Duration  12 weeks    PT Treatment/Interventions  ADLs/Self Care Home Management;Gait training;Stair training;Functional mobility training;Therapeutic activities;Therapeutic exercise;Balance training;Neuromuscular re-education;Patient/family education;Prosthetic Training;Vestibular    PT Next Visit Plan  Continue with prosthetic education, gait & balance towards STGs, Transfer plan of care to our [facility] clinic next week with this prosthetic PT specialist available for consult    Consulted and Agree with Plan of Care  Patient       Patient will benefit from skilled therapeutic intervention in order to improve the following deficits and impairments:  Abnormal gait, Decreased activity tolerance, Decreased balance, Decreased endurance, Decreased knowledge of use of DME, Decreased mobility, Decreased strength, Decreased skin integrity, Impaired flexibility, Postural dysfunction, Prosthetic Dependency  Visit Diagnosis: 1. Other abnormalities of gait and mobility   2. Muscle weakness (generalized)   3. Unsteadiness on feet   4. Abnormal posture        Problem List Patient Active Problem List   Diagnosis Date Noted  . Unilateral complete BKA, right, subsequent encounter ([place])   . S/P unilateral BKA (below knee amputation), right ([place])   . Subtherapeutic international normalized ratio (INR)   . Noncompliance   . Leukocytosis   . Acute blood loss anemia   . Diabetes mellitus type 2 in obese ([place])   . Unilateral complete BKA, right, initial encounter ([place]) 07/11/2018  . Acquired absence of right leg below knee ([place]) 07/08/2018  . Mild protein-calorie malnutrition ([place])   . Osteomyelitis of great toe of right foot ([place]) 07/04/2018  . Intermittent claudication ([place]) 05/11/2017  . Deep vein thrombosis (DVT) of both lower extremities ([place]) 01/05/2017  . Peripheral vascular insufficiency ([place]) 09/25/2015  . Hypothyroidism 01/29/2014  . Obesity (BMI 30-39.9) 08/16/2013  . Impetigo  07/28/2013  . Chronic obstructive pulmonary disease ([place])   . Malignant neoplasm of uterus ([place])   . GERD (gastroesophageal reflux disease) 10/18/2012  . Hernia, hiatal 10/18/2012  . Hyperlipidemia 04/18/2008  . SMOKER 04/18/2008  . Essential hypertension 04/18/2008    Jamey Reas PT, DPT 12/16/2018, 12:50 PM  [facility] [address] [place], [place], [address] Phone: [phone]   Fax:  [phone]  Name: [name] MRN: [id number] Date of Birth: 04-27-1961

## 2018-12-19 ENCOUNTER — Ambulatory Visit: Payer: Medicare Other | Admitting: Physical Therapy

## 2018-12-19 ENCOUNTER — Encounter: Payer: Self-pay | Admitting: Physical Therapy

## 2018-12-19 ENCOUNTER — Other Ambulatory Visit: Payer: Self-pay

## 2018-12-19 DIAGNOSIS — R293 Abnormal posture: Secondary | ICD-10-CM

## 2018-12-19 DIAGNOSIS — R2689 Other abnormalities of gait and mobility: Secondary | ICD-10-CM

## 2018-12-19 DIAGNOSIS — R2681 Unsteadiness on feet: Secondary | ICD-10-CM | POA: Diagnosis not present

## 2018-12-19 DIAGNOSIS — M6281 Muscle weakness (generalized): Secondary | ICD-10-CM | POA: Diagnosis not present

## 2018-12-19 NOTE — Therapy (Signed)
[facility] [place], [place], [address] Phone: [phone]   Fax:  [phone]  Physical Therapy Treatment  Patient Details  Name: [name] MRN: [id number] Date of Birth: [birth year] Referring Provider (PT): [name], MD   Encounter Date: 12/19/2018  PT End of Session - 12/19/18 1250    Visit Number  4    Number of Visits  25    Date for PT Re-Evaluation  03/07/19    Authorization Type  Medicare Part A & B    Authorization Time Period  20% co-pay after meeting deductible    PT Start Time  1116    PT Stop Time  1203    PT Time Calculation (min)  47 min    Activity Tolerance  Patient tolerated treatment well;Patient limited by fatigue    Behavior During Therapy  [facility] for tasks assessed/performed       Past Medical History:  Diagnosis Date  . COPD (chronic obstructive pulmonary disease) ([place])   . Depression   . DM (diabetes mellitus) ([place])   . DVT (deep venous thrombosis) ([facility])    x5  . Family history of colon cancer   . GERD (gastroesophageal reflux disease)   . Hyperlipemia   . Hypertension   . Hypothyroid   . Obesity   . PVD (peripheral vascular disease) ([place])   . Uterine cancer ([place])   . UTI (urinary tract infection)     Past Surgical History:  Procedure Laterality Date  . ABDOMINAL HYSTERECTOMY    . AMPUTATION Right 07/08/2018   Procedure: RIGHT BELOW KNEE AMPUTATION;  Surgeon: [name], MD;  Location: [facility];  Service: Orthopedics;  Laterality: Right;  . CESAREAN SECTION    . FEMORAL BYPASS     x 5  . LUMBAR DISC SURGERY     L4-L5  . TOE AMPUTATION     right  . TONSILLECTOMY      There were no vitals filed for this visit.  Subjective Assessment - 12/19/18 1210    Subjective  COVID-19 screening performed at arrival. Patient arrives feeling good with no pain in right TTA.    Pertinent History  R TTA, COPD, depression, DM, HTN, HLD, obesity, uterine CA, L4-5 decompression sg     Patient Stated Goals  She wants to use prosthesis walk, grocery shop, drive, cook standing    Currently in Pain?  No/denies         [employer] PT Assessment - 12/19/18 0001      Assessment   Medical Diagnosis  Right Transtibial Amputation    Referring Provider (PT)  [name], MD    Onset Date/Surgical Date  11/16/18    Hand Dominance  Left    Prior Therapy  none                   [facility] Adult PT Treatment/Exercise - 12/19/18 0001      Transfers   Transfers  Sit to Stand;Stand to Sit    Sit to Stand  6: Modified independent (Device/Increase time)    Sit to Stand Details  Verbal cues for sequencing;Verbal cues for precautions/safety;Tactile cues for weight shifting    Stand to Sit  5: Supervision    Stand to Sit Details (indicate cue type and reason)  Visual cues/gestures for sequencing;Verbal cues for sequencing;Verbal cues for technique;Tactile cues for weight shifting      Ambulation/Gait   Ambulation/Gait  Yes    Ambulation/Gait Assistance  5:  Supervision    Ambulation/Gait Assistance Details  verbal cuing for step height and to prevent circumduction and right foot shuffling.    Ambulation Distance (Feet)  60 Feet    Assistive device  Rolling walker;Prosthesis    Gait Pattern  Step-through pattern;Decreased arm swing - right;Decreased step length - left;Decreased stance time - right;Decreased hip/knee flexion - right;Antalgic;Lateral hip instability;Abducted- right    Ambulation Surface  Level;Indoor    Stairs  Yes    Stairs Assistance  5: Supervision    Stair Management Technique  One rail Right;Step to pattern;Forwards;With walker    Number of Stairs  2    Height of Stairs  6      Exercises   Exercises  Knee/Hip      Knee/Hip Exercises: Aerobic   Nustep  Level 4 x10 minutes with UE      Knee/Hip Exercises: Seated   Clamshell with [employer]raBand  Red   x2 minutes   Marching  AROM;Both;2 sets;10 reps    Sit to [employer]  2 sets;10 reps          Balance  Exercises - 12/19/18 1253      Balance Exercises: Standing   Sidestepping  5 reps;Other (comment)    Marching Limitations  6" toe tapping with intermittent UE support    Other Standing Exercises  lateral weight shifting with intermittent UE support x3 minutes          PT Short Term Goals - 12/13/18 0749      PT SHORT TERM GOAL #1   Title  Patient demonstrates proper donning & verbalizes proper cleaning of prosthesis. (All STGs Target Date: 01/06/2019)    Time  4    Period  Weeks    Status  On-going    Target Date  01/06/19      PT SHORT TERM GOAL #2   Title  Patient tolerates prosthesis wear >12hrs total/day with no increase in wound size    Time  4    Period  Weeks    Status  On-going    Target Date  01/06/19      PT SHORT TERM GOAL #3   Title  Patient ambulates 250' with cane & prosthesis with minA.    Time  4    Period  Weeks    Status  On-going    Target Date  01/06/19      PT SHORT TERM GOAL #4   Title  Patient negotiates stairs single rail, ramps & curbs with cane & prosthesis with minA.    Time  4    Period  Weeks    Status  On-going    Target Date  01/06/19      PT SHORT TERM GOAL #5   Title  Patient lifts 10# box from floor with supervision.    Time  4    Period  Weeks    Status  On-going    Target Date  01/06/19        PT Long Term Goals - 12/13/18 0749      PT LONG TERM GOAL #1   Title  Patient demonstrates & verbalizes proper prosthetic care to enable safe utilization of prosthesis. (All LTGs Target Date: 03/03/2019)    Time  12    Period  Weeks    Status  On-going    Target Date  03/03/19      PT LONG TERM GOAL #2   Title  Patient tolerates wear of prosthesis >90% of  awake hours without skin or limb pain issues to enable function throughout her day.    Time  12    Period  Weeks    Status  On-going    Target Date  03/03/19      PT LONG TERM GOAL #3   Title  Berg Balance >/= 45/56 to indicate lower fall risk.    Time  12    Period  Weeks     Status  On-going    Target Date  03/03/19      PT LONG TERM GOAL #4   Title  Patient ambulates 500 outdoors including grass' with cane or less and prosthesis modified independent to enable community mobility.    Time  12    Period  Weeks    Status  On-going    Target Date  03/03/19      PT LONG TERM GOAL #5   Title  Patient negotiates ramps, curbs and stairs single rail with cane or less & prosthesis to enable community access.    Time  12    Period  Weeks    Status  On-going    Target Date  03/03/19      PT LONG TERM GOAL #6   Title  Patient ambulates around furniture carrying household items with prosthesis only modified independent.    Time  12    Period  Weeks    Status  On-going    Target Date  03/03/19      PT LONG TERM GOAL #7   Title  Dynamic Gait Index with cane & prosthesis >16/24.    Time  12    Period  Weeks    Status  On-going    Target Date  03/03/19            Plan - 12/19/18 1306    Clinical Impression Statement  Patient was able to tolerate treatment well but with fatigue, especially on the left LE. Patient was able to demonstrate strong carryover of education but was unable to recite 1 of 4 ways of sweat management. Patient instructed to call [facility]. Patient reported understanding. Patient was able to ambulate throughout clinic and outdoors with supervision.    Personal Factors and Comorbidities  Comorbidity 3+;Fitness;Past/Current Experience;Time since onset of injury/illness/exacerbation    Comorbidities  R TTA, COPD, depression, DM, HTN, HLD, obesity, uterine CA, L4-5 decompression sg    Examination-Activity Limitations  Caring for Others;Locomotion Level;Reach Overhead;Stairs;Stand;Transfers    PT Frequency  2x / week    PT Duration  12 weeks    PT Treatment/Interventions  ADLs/Self Care Home Management;Gait training;Stair training;Functional mobility training;Therapeutic activities;Therapeutic exercise;Balance training;Neuromuscular  re-education;Patient/family education;Prosthetic Training;Vestibular    PT Next Visit Plan  Continue with prosthetic education, gait & balance towards STGs, Transfer plan of care to our [facility] clinic next week with this prosthetic PT specialist available for consult    Consulted and Agree with Plan of Care  Patient       Patient will benefit from skilled therapeutic intervention in order to improve the following deficits and impairments:  Abnormal gait, Decreased activity tolerance, Decreased balance, Decreased endurance, Decreased knowledge of use of DME, Decreased mobility, Decreased strength, Decreased skin integrity, Impaired flexibility, Postural dysfunction, Prosthetic Dependency  Visit Diagnosis: 1. Other abnormalities of gait and mobility   2. Muscle weakness (generalized)   3. Unsteadiness on feet   4. Abnormal posture        Problem List Patient Active Problem List   Diagnosis  Date Noted  . Unilateral complete BKA, right, subsequent encounter ([place])   . S/P unilateral BKA (below knee amputation), right ([place])   . Subtherapeutic international normalized ratio (INR)   . Noncompliance   . Leukocytosis   . Acute blood loss anemia   . Diabetes mellitus type 2 in obese ([place])   . Unilateral complete BKA, right, initial encounter ([place]) 07/11/2018  . Acquired absence of right leg below knee ([place]) 07/08/2018  . Mild protein-calorie malnutrition ([place])   . Osteomyelitis of great toe of right foot ([place]) 07/04/2018  . Intermittent claudication ([place]) 05/11/2017  . Deep vein thrombosis (DVT) of both lower extremities ([place]) 01/05/2017  . Peripheral vascular insufficiency ([place]) 09/25/2015  . Hypothyroidism 01/29/2014  . Obesity (BMI 30-39.9) 08/16/2013  . Impetigo 07/28/2013  . Chronic obstructive pulmonary disease ([place])   . Malignant neoplasm of uterus ([place])   . GERD (gastroesophageal reflux disease) 10/18/2012  . Hernia, hiatal 10/18/2012  . Hyperlipidemia 04/18/2008  . SMOKER  04/18/2008  . Essential hypertension 04/18/2008   [name], PT, DPT 12/19/2018, 1:14 PM  [facility] [address] [place], [place], [address] Phone: [phone]   Fax:  [phone]  Name: [name] MRN: [id number] Date of Birth: 1960-11-04

## 2018-12-21 ENCOUNTER — Encounter: Payer: Self-pay | Admitting: Pharmacist Clinician (PhC)/ Clinical Pharmacy Specialist

## 2018-12-22 ENCOUNTER — Other Ambulatory Visit: Payer: Self-pay

## 2018-12-22 ENCOUNTER — Ambulatory Visit: Payer: Medicare Other | Admitting: Physical Therapy

## 2018-12-22 DIAGNOSIS — R2681 Unsteadiness on feet: Secondary | ICD-10-CM | POA: Diagnosis not present

## 2018-12-22 DIAGNOSIS — R293 Abnormal posture: Secondary | ICD-10-CM

## 2018-12-22 DIAGNOSIS — M6281 Muscle weakness (generalized): Secondary | ICD-10-CM

## 2018-12-22 DIAGNOSIS — R2689 Other abnormalities of gait and mobility: Secondary | ICD-10-CM

## 2018-12-22 NOTE — Therapy (Signed)
[facility] [place], [place], [address] Phone: [phone]   Fax:  [phone]  Physical Therapy Treatment  Patient Details  Name: [name] MRN: [id number] Date of Birth: 1961/03/16 Referring Provider (PT): [name], MD   Encounter Date: 12/22/2018  PT End of Session - 12/22/18 1743    Visit Number  5    Number of Visits  25    Date for PT Re-Evaluation  03/07/19    Authorization Type  Medicare Part A & B    Authorization Time Period  20% co-pay after meeting deductible    PT Start Time  1645    PT Stop Time  1733    PT Time Calculation (min)  48 min    Activity Tolerance  Patient tolerated treatment well;Patient limited by fatigue    Behavior During Therapy  [facility] for tasks assessed/performed       Past Medical History:  Diagnosis Date  . COPD (chronic obstructive pulmonary disease) ([place])   . Depression   . DM (diabetes mellitus) ([place])   . DVT (deep venous thrombosis) ([facility])    x5  . Family history of colon cancer   . GERD (gastroesophageal reflux disease)   . Hyperlipemia   . Hypertension   . Hypothyroid   . Obesity   . PVD (peripheral vascular disease) ([place])   . Uterine cancer ([place])   . UTI (urinary tract infection)     Past Surgical History:  Procedure Laterality Date  . ABDOMINAL HYSTERECTOMY    . AMPUTATION Right 07/08/2018   Procedure: RIGHT BELOW KNEE AMPUTATION;  Surgeon: [name], MD;  Location: [facility];  Service: Orthopedics;  Laterality: Right;  . CESAREAN SECTION    . FEMORAL BYPASS     x 5  . LUMBAR DISC SURGERY     L4-L5  . TOE AMPUTATION     right  . TONSILLECTOMY      There were no vitals filed for this visit.  Subjective Assessment - 12/22/18 1651    Subjective  COVID-19 screening performed at arrival. Patient arrives feeling very tired and felt as though she may have over done it yesterday.    Pertinent History  R TTA, COPD, depression, DM, HTN, HLD, obesity,  uterine CA, L4-5 decompression sg    Patient Stated Goals  She wants to use prosthesis walk, grocery shop, drive, cook standing    Currently in Pain?  No/denies         [facility] PT Assessment - 12/22/18 0001      Assessment   Medical Diagnosis  Right Transtibial Amputation    Referring Provider (PT)  [name], MD    Onset Date/Surgical Date  11/16/18    Hand Dominance  Left    Prior Therapy  none                   [facility] Adult PT Treatment/Exercise - 12/22/18 0001      Knee/Hip Exercises: Aerobic   Nustep  Level 4 x10 minutes with UE      Knee/Hip Exercises: Seated   Other Seated Knee/Hip Exercises  ball squeeze 5"hold x 20    Marching  AROM;Both;2 sets;10 reps    Sit to Sand  2 sets;10 reps;without UE support      Knee/Hip Exercises: Sidelying   Hip ABduction  AROM;Both;2 sets;10 reps    Other Sidelying Knee/Hip Exercises  right hip and knee AROM swing through 2x10    Other  Sidelying Knee/Hip Exercises  bilateral hip extension 2x10          Balance Exercises - 12/22/18 1752      Balance Exercises: Seated   Dynamic Sitting  Eyes opened;Lower extremity support - 1;Anterior/posterior weight shift;Lateral weight shift;Reaching outside base of support    Other Seated Exercises  trunk rotation x20        PT Education - 12/22/18 1742    Education Details  sidelying hip abduction bilaterally, hip and knee sidelying AROM, hip adduction isometric, hip abduction isometric, AP and lateral weight shifting without prosthetic.    Person(s) Educated  Patient    Methods  Explanation;Demonstration;Handout    Comprehension  Verbalized understanding;Returned demonstration       PT Short Term Goals - 12/13/18 0749      PT SHORT TERM GOAL #1   Title  Patient demonstrates proper donning & verbalizes proper cleaning of prosthesis. (All STGs Target Date: 01/06/2019)    Time  4    Period  Weeks    Status  On-going    Target Date  01/06/19      PT SHORT TERM GOAL #2    Title  Patient tolerates prosthesis wear >12hrs total/day with no increase in wound size    Time  4    Period  Weeks    Status  On-going    Target Date  01/06/19      PT SHORT TERM GOAL #3   Title  Patient ambulates 250' with cane & prosthesis with minA.    Time  4    Period  Weeks    Status  On-going    Target Date  01/06/19      PT SHORT TERM GOAL #4   Title  Patient negotiates stairs single rail, ramps & curbs with cane & prosthesis with minA.    Time  4    Period  Weeks    Status  On-going    Target Date  01/06/19      PT SHORT TERM GOAL #5   Title  Patient lifts 10# box from floor with supervision.    Time  4    Period  Weeks    Status  On-going    Target Date  01/06/19        PT Long Term Goals - 12/13/18 0749      PT LONG TERM GOAL #1   Title  Patient demonstrates & verbalizes proper prosthetic care to enable safe utilization of prosthesis. (All LTGs Target Date: 03/03/2019)    Time  12    Period  Weeks    Status  On-going    Target Date  03/03/19      PT LONG TERM GOAL #2   Title  Patient tolerates wear of prosthesis >90% of awake hours without skin or limb pain issues to enable function throughout her day.    Time  12    Period  Weeks    Status  On-going    Target Date  03/03/19      PT LONG TERM GOAL #3   Title  Berg Balance >/= 45/56 to indicate lower fall risk.    Time  12    Period  Weeks    Status  On-going    Target Date  03/03/19      PT LONG TERM GOAL #4   Title  Patient ambulates 500 outdoors including grass' with cane or less and prosthesis modified independent to enable community mobility.  Time  12    Period  Weeks    Status  On-going    Target Date  03/03/19      PT LONG TERM GOAL #5   Title  Patient negotiates ramps, curbs and stairs single rail with cane or less & prosthesis to enable community access.    Time  12    Period  Weeks    Status  On-going    Target Date  03/03/19      PT LONG TERM GOAL #6   Title  Patient  ambulates around furniture carrying household items with prosthesis only modified independent.    Time  12    Period  Weeks    Status  On-going    Target Date  03/03/19      PT LONG TERM GOAL #7   Title  Dynamic Gait Index with cane & prosthesis >16/24.    Time  12    Period  Weeks    Status  On-going    Target Date  03/03/19            Plan - 12/22/18 1743    Clinical Impression Statement  Patient was able to tolerate treatment well with no reports of increased pain. Patient was motivated despite feeling tired. Patient expressed she would like to strengthen left LE therefore supine and sidelying exercises were performed. Patient provided with HEP consisting of strengthening of both LEs. Patient reported understanding.    Personal Factors and Comorbidities  Comorbidity 3+;Fitness;Past/Current Experience;Time since onset of injury/illness/exacerbation    Comorbidities  R TTA, COPD, depression, DM, HTN, HLD, obesity, uterine CA, L4-5 decompression sg    Examination-Activity Limitations  Caring for Others;Locomotion Level;Reach Overhead;Stairs;Stand;Transfers    Examination-Participation Restrictions  Community Activity;Driving;Meal Prep;Yard Work;Volunteer    PT Frequency  2x / week    PT Duration  12 weeks    PT Treatment/Interventions  ADLs/Self Care Home Management;Gait training;Stair training;Functional mobility training;Therapeutic activities;Therapeutic exercise;Balance training;Neuromuscular re-education;Patient/family education;Prosthetic Training;Vestibular    PT Next Visit Plan  Continue with prosthetic education, gait & balance towards STGs, bilateral LE strengthening, gait training with proper gait mechanics.    PT Home Exercise Plan  see patient education section    Consulted and Agree with Plan of Care  Patient       Patient will benefit from skilled therapeutic intervention in order to improve the following deficits and impairments:  Abnormal gait, Decreased activity  tolerance, Decreased balance, Decreased endurance, Decreased knowledge of use of DME, Decreased mobility, Decreased strength, Decreased skin integrity, Impaired flexibility, Postural dysfunction, Prosthetic Dependency  Visit Diagnosis: 1. Other abnormalities of gait and mobility   2. Muscle weakness (generalized)   3. Unsteadiness on feet   4. Abnormal posture        Problem List Patient Active Problem List   Diagnosis Date Noted  . Unilateral complete BKA, right, subsequent encounter ([place])   . S/P unilateral BKA (below knee amputation), right ([place])   . Subtherapeutic international normalized ratio (INR)   . Noncompliance   . Leukocytosis   . Acute blood loss anemia   . Diabetes mellitus type 2 in obese ([place])   . Unilateral complete BKA, right, initial encounter ([place]) 07/11/2018  . Acquired absence of right leg below knee ([place]) 07/08/2018  . Mild protein-calorie malnutrition ([place])   . Osteomyelitis of great toe of right foot ([place]) 07/04/2018  . Intermittent claudication ([place]) 05/11/2017  . Deep vein thrombosis (DVT) of both lower extremities ([place]) 01/05/2017  . Peripheral  vascular insufficiency ([place]) 09/25/2015  . Hypothyroidism 01/29/2014  . Obesity (BMI 30-39.9) 08/16/2013  . Impetigo 07/28/2013  . Chronic obstructive pulmonary disease ([place])   . Malignant neoplasm of uterus ([place])   . GERD (gastroesophageal reflux disease) 10/18/2012  . Hernia, hiatal 10/18/2012  . Hyperlipidemia 04/18/2008  . SMOKER 04/18/2008  . Essential hypertension 04/18/2008   [name], PT, DPT 12/22/2018, 5:54 PM  [facility] [address] [place], [place], [address] Phone: [phone]   Fax:  [phone]  Name: SABRYNA LAHM MRN: [id number] Date of Birth: [birth year]

## 2018-12-26 ENCOUNTER — Encounter: Payer: Self-pay | Admitting: Physical Therapy

## 2018-12-26 ENCOUNTER — Ambulatory Visit: Payer: Medicare Other | Admitting: Physical Therapy

## 2018-12-26 ENCOUNTER — Other Ambulatory Visit: Payer: Self-pay

## 2018-12-26 DIAGNOSIS — R2689 Other abnormalities of gait and mobility: Secondary | ICD-10-CM

## 2018-12-26 DIAGNOSIS — R293 Abnormal posture: Secondary | ICD-10-CM | POA: Diagnosis not present

## 2018-12-26 DIAGNOSIS — M6281 Muscle weakness (generalized): Secondary | ICD-10-CM

## 2018-12-26 DIAGNOSIS — R2681 Unsteadiness on feet: Secondary | ICD-10-CM

## 2018-12-26 NOTE — Therapy (Signed)
[facility] [place], [place], [address] Phone: [phone]   Fax:  [phone]  Physical Therapy Treatment  Patient Details  Name: [name] MRN: [id number] Date of Birth: [birth year] Referring Provider (PT): [name], MD   Encounter Date: 12/26/2018  PT End of Session - 12/26/18 1219    Visit Number  6    Number of Visits  25    Date for PT Re-Evaluation  03/07/19    Authorization Type  Medicare Part A & B    Authorization Time Period  20% co-pay after meeting deductible    PT Start Time  1115    PT Stop Time  1202    PT Time Calculation (min)  47 min    Equipment Utilized During Treatment  Gait belt    Activity Tolerance  Patient tolerated treatment well;Patient limited by fatigue    Behavior During Therapy  [facility] for tasks assessed/performed       Past Medical History:  Diagnosis Date  . COPD (chronic obstructive pulmonary disease) ([place])   . Depression   . DM (diabetes mellitus) ([place])   . DVT (deep venous thrombosis) ([facility])    x5  . Family history of colon cancer   . GERD (gastroesophageal reflux disease)   . Hyperlipemia   . Hypertension   . Hypothyroid   . Obesity   . PVD (peripheral vascular disease) ([place])   . Uterine cancer ([place])   . UTI (urinary tract infection)     Past Surgical History:  Procedure Laterality Date  . ABDOMINAL HYSTERECTOMY    . AMPUTATION Right 07/08/2018   Procedure: RIGHT BELOW KNEE AMPUTATION;  Surgeon: [name], MD;  Location: [facility];  Service: Orthopedics;  Laterality: Right;  . CESAREAN SECTION    . FEMORAL BYPASS     x 5  . LUMBAR DISC SURGERY     L4-L5  . TOE AMPUTATION     right  . TONSILLECTOMY      There were no vitals filed for this visit.  Subjective Assessment - 12/26/18 1120    Subjective  COVID-19 screening performed at arrival. Patient reports her last scab fell off and her inscision is healed. Patient also stated she felt very  comfortable with her leg yesterday and felt normal.    Pertinent History  R TTA, COPD, depression, DM, HTN, HLD, obesity, uterine CA, L4-5 decompression sg    Patient Stated Goals  She wants to use prosthesis walk, grocery shop, drive, cook standing         [facility] PT Assessment - 12/26/18 0001      Assessment   Medical Diagnosis  Right Transtibial Amputation    Referring Provider (PT)  [name], MD    Onset Date/Surgical Date  11/16/18    Hand Dominance  Left    Prior Therapy  none      Precautions   Precautions  Fall                   [facility] Adult PT Treatment/Exercise - 12/26/18 0001      Transfers   Transfers  Sit to Stand    Sit to Stand  6: Modified independent (Device/Increase time)    Comments  no cuing provided for sit to stand transitions, able to perform without UE assist on elevated surface      Ambulation/Gait   Ambulation/Gait  Yes    Ambulation/Gait Assistance  5: Supervision    Ambulation/Gait Assistance  Details  verbal cuing for prevention of right hip circumduction and increased knee flexion    Ambulation Distance (Feet)  176 Feet   84, 58, 35 feet with breaks   Assistive device  Rolling walker;Prosthesis;Straight cane    Gait Pattern  Step-through pattern;Decreased arm swing - right;Decreased step length - left;Decreased stance time - right;Decreased hip/knee flexion - right;Lateral hip instability;Abducted- right;Decreased weight shift to right;Right circumduction;Trendelenburg    Ambulation Surface  Level;Unlevel    Ramp  4: Min assist    Ramp Details (indicate cue type and reason)  verbal cuing for proper weight shifting and increased left step length while ascending    Gait Comments  Patient able to verbalize cuing for ramp negotiation      Therapeutic Activites    Therapeutic Activities  Lifting    Lifting  14# box lift from floor to plinth x3      Exercises   Exercises  Knee/Hip      Knee/Hip Exercises: Aerobic   Nustep  Level 5 x12  minutes with UE; 600 steps total      Knee/Hip Exercises: Standing   SLS with Vectors  standing with Cane support colored pod tapping with min A. bilaterally    Other Standing Knee Exercises  lateral stepping over colored pods with varying UE support.                PT Short Term Goals - 12/26/18 1147      PT SHORT TERM GOAL #1   Title  Patient demonstrates proper donning & verbalizes proper cleaning of prosthesis. (All STGs Target Date: 01/06/2019)    Time  4    Period  Weeks    Status  Achieved    Target Date  01/06/19      PT SHORT TERM GOAL #2   Title  Patient tolerates prosthesis wear >12hrs total/day with no increase in wound size    Time  4    Period  Weeks    Status  On-going    Target Date  01/06/19      PT SHORT TERM GOAL #3   Title  Patient ambulates 250' with cane & prosthesis with minA.    Time  4    Period  Weeks    Status  On-going      PT SHORT TERM GOAL #4   Title  Patient negotiates stairs single rail, ramps & curbs with cane & prosthesis with minA.    Time  4    Period  Weeks    Status  On-going      PT SHORT TERM GOAL #5   Title  Patient lifts 10# box from floor with supervision.    Time  4    Status  Achieved    Target Date  01/06/19        PT Long Term Goals - 12/13/18 0749      PT LONG TERM GOAL #1   Title  Patient demonstrates & verbalizes proper prosthetic care to enable safe utilization of prosthesis. (All LTGs Target Date: 03/03/2019)    Time  12    Period  Weeks    Status  On-going    Target Date  03/03/19      PT LONG TERM GOAL #2   Title  Patient tolerates wear of prosthesis >90% of awake hours without skin or limb pain issues to enable function throughout her day.    Time  12    Period  Weeks  Status  On-going    Target Date  03/03/19      PT LONG TERM GOAL #3   Title  Berg Balance >/= 45/56 to indicate lower fall risk.    Time  12    Period  Weeks    Status  On-going    Target Date  03/03/19      PT LONG TERM  GOAL #4   Title  Patient ambulates 500 outdoors including grass' with cane or less and prosthesis modified independent to enable community mobility.    Time  12    Period  Weeks    Status  On-going    Target Date  03/03/19      PT LONG TERM GOAL #5   Title  Patient negotiates ramps, curbs and stairs single rail with cane or less & prosthesis to enable community access.    Time  12    Period  Weeks    Status  On-going    Target Date  03/03/19      PT LONG TERM GOAL #6   Title  Patient ambulates around furniture carrying household items with prosthesis only modified independent.    Time  12    Period  Weeks    Status  On-going    Target Date  03/03/19      PT LONG TERM GOAL #7   Title  Dynamic Gait Index with cane & prosthesis >16/24.    Time  12    Period  Weeks    Status  On-going    Target Date  03/03/19            Plan - 12/26/18 1216    Clinical Impression Statement  Patient was able to tolerate treatment fairly well with no adverse affects on right LE. Patient has ongoing hip soreness on left side and patient was educated on finding balance to prevent over working. Patient reported understanding. Patient's STG were assessed to which patient has achieved two goals. Other goals are ongoing at this time to optimize gait mechanics.    Personal Factors and Comorbidities  Comorbidity 3+;Fitness;Past/Current Experience;Time since onset of injury/illness/exacerbation    Comorbidities  R TTA, COPD, depression, DM, HTN, HLD, obesity, uterine CA, L4-5 decompression sg    Examination-Activity Limitations  Caring for Others;Locomotion Level;Reach Overhead;Stairs;Stand;Transfers    Examination-Participation Restrictions  Community Activity;Driving;Meal Prep;Yard Work;Volunteer    PT Frequency  2x / week    PT Duration  12 weeks    PT Treatment/Interventions  ADLs/Self Care Home Management;Gait training;Stair training;Functional mobility training;Therapeutic activities;Therapeutic  exercise;Balance training;Neuromuscular re-education;Patient/family education;Prosthetic Training;Vestibular    PT Next Visit Plan  Continue with prosthetic education, gait & balance towards STGs, bilateral LE strengthening, gait training with proper gait mechanics.    PT Home Exercise Plan  see patient education section    Consulted and Agree with Plan of Care  Patient       Patient will benefit from skilled therapeutic intervention in order to improve the following deficits and impairments:  Abnormal gait, Decreased activity tolerance, Decreased balance, Decreased endurance, Decreased knowledge of use of DME, Decreased mobility, Decreased strength, Decreased skin integrity, Impaired flexibility, Postural dysfunction, Prosthetic Dependency  Visit Diagnosis: 1. Other abnormalities of gait and mobility   2. Muscle weakness (generalized)   3. Unsteadiness on feet   4. Abnormal posture        Problem List Patient Active Problem List   Diagnosis Date Noted  . Unilateral complete BKA, right, subsequent encounter ([place])   .  S/P unilateral BKA (below knee amputation), right ([place])   . Subtherapeutic international normalized ratio (INR)   . Noncompliance   . Leukocytosis   . Acute blood loss anemia   . Diabetes mellitus type 2 in obese ([place])   . Unilateral complete BKA, right, initial encounter ([place]) 07/11/2018  . Acquired absence of right leg below knee ([place]) 07/08/2018  . Mild protein-calorie malnutrition ([place])   . Osteomyelitis of great toe of right foot ([place]) 07/04/2018  . Intermittent claudication ([place]) 05/11/2017  . Deep vein thrombosis (DVT) of both lower extremities ([place]) 01/05/2017  . Peripheral vascular insufficiency ([place]) 09/25/2015  . Hypothyroidism 01/29/2014  . Obesity (BMI 30-39.9) 08/16/2013  . Impetigo 07/28/2013  . Chronic obstructive pulmonary disease ([place])   . Malignant neoplasm of uterus ([place])   . GERD (gastroesophageal reflux disease) 10/18/2012  . Hernia, hiatal  10/18/2012  . Hyperlipidemia 04/18/2008  . SMOKER 04/18/2008  . Essential hypertension 04/18/2008   [name], PT, DPT 12/26/2018, 12:21 PM  [facility] [address] [place], [place], [address] Phone: [phone]   Fax:  [phone]  Name: [name] MRN: [id number] Date of Birth: 02/05/1961

## 2018-12-27 ENCOUNTER — Encounter: Payer: Self-pay | Admitting: Family Medicine

## 2018-12-27 ENCOUNTER — Ambulatory Visit (INDEPENDENT_AMBULATORY_CARE_PROVIDER_SITE_OTHER): Payer: Medicare Other | Admitting: Family Medicine

## 2018-12-27 ENCOUNTER — Telehealth: Payer: Self-pay | Admitting: Family Medicine

## 2018-12-27 VITALS — BP 92/69 | HR 73 | Temp 98.6°F | Ht 70.0 in | Wt 239.0 lb

## 2018-12-27 DIAGNOSIS — Z9119 Patient's noncompliance with other medical treatment and regimen: Secondary | ICD-10-CM | POA: Diagnosis not present

## 2018-12-27 DIAGNOSIS — I129 Hypertensive chronic kidney disease with stage 1 through stage 4 chronic kidney disease, or unspecified chronic kidney disease: Secondary | ICD-10-CM | POA: Diagnosis not present

## 2018-12-27 DIAGNOSIS — E1122 Type 2 diabetes mellitus with diabetic chronic kidney disease: Secondary | ICD-10-CM

## 2018-12-27 DIAGNOSIS — E039 Hypothyroidism, unspecified: Secondary | ICD-10-CM

## 2018-12-27 DIAGNOSIS — E785 Hyperlipidemia, unspecified: Secondary | ICD-10-CM

## 2018-12-27 DIAGNOSIS — E559 Vitamin D deficiency, unspecified: Secondary | ICD-10-CM | POA: Diagnosis not present

## 2018-12-27 DIAGNOSIS — J418 Mixed simple and mucopurulent chronic bronchitis: Secondary | ICD-10-CM

## 2018-12-27 DIAGNOSIS — K219 Gastro-esophageal reflux disease without esophagitis: Secondary | ICD-10-CM

## 2018-12-27 DIAGNOSIS — Z89511 Acquired absence of right leg below knee: Secondary | ICD-10-CM

## 2018-12-27 DIAGNOSIS — E1151 Type 2 diabetes mellitus with diabetic peripheral angiopathy without gangrene: Secondary | ICD-10-CM | POA: Insufficient documentation

## 2018-12-27 DIAGNOSIS — E1152 Type 2 diabetes mellitus with diabetic peripheral angiopathy with gangrene: Secondary | ICD-10-CM | POA: Diagnosis not present

## 2018-12-27 DIAGNOSIS — E1169 Type 2 diabetes mellitus with other specified complication: Secondary | ICD-10-CM | POA: Diagnosis not present

## 2018-12-27 DIAGNOSIS — E7800 Pure hypercholesterolemia, unspecified: Secondary | ICD-10-CM | POA: Diagnosis not present

## 2018-12-27 DIAGNOSIS — I152 Hypertension secondary to endocrine disorders: Secondary | ICD-10-CM | POA: Insufficient documentation

## 2018-12-27 DIAGNOSIS — Z91199 Patient's noncompliance with other medical treatment and regimen due to unspecified reason: Secondary | ICD-10-CM

## 2018-12-27 DIAGNOSIS — E1159 Type 2 diabetes mellitus with other circulatory complications: Secondary | ICD-10-CM | POA: Insufficient documentation

## 2018-12-27 DIAGNOSIS — I10 Essential (primary) hypertension: Secondary | ICD-10-CM | POA: Diagnosis not present

## 2018-12-27 DIAGNOSIS — I82A23 Chronic embolism and thrombosis of axillary vein, bilateral: Secondary | ICD-10-CM | POA: Diagnosis not present

## 2018-12-27 DIAGNOSIS — Z7901 Long term (current) use of anticoagulants: Secondary | ICD-10-CM | POA: Diagnosis not present

## 2018-12-27 HISTORY — DX: Type 2 diabetes mellitus with diabetic peripheral angiopathy with gangrene: E11.52

## 2018-12-27 LAB — COAGUCHEK XS/INR WAIVED
INR: 1.9 — ABNORMAL HIGH (ref 0.9–1.1)
Prothrombin Time: 22.4 s

## 2018-12-27 LAB — POCT INR: INR: 1.9 — AB (ref 2.0–3.0)

## 2018-12-27 LAB — BAYER DCA HB A1C WAIVED: HB A1C (BAYER DCA - WAIVED): 7.6 % — ABNORMAL HIGH (ref <7.0)

## 2018-12-27 MED ORDER — PANTOPRAZOLE SODIUM 40 MG PO TBEC: 40.0000 mg | DELAYED_RELEASE_TABLET | Freq: Every day | ORAL | 3 refills | Status: DC

## 2018-12-27 NOTE — Patient Instructions (Signed)
Health Maintenance, Female Adopting a healthy lifestyle and getting preventive care are important in promoting health and wellness. Ask your health care provider about:  The right schedule for you to have regular tests and exams.  Things you can do on your own to prevent diseases and keep yourself healthy. What should I know about diet, weight, and exercise? Eat a healthy diet   Eat a diet that includes plenty of vegetables, fruits, low-fat dairy products, and lean protein.  Do not eat a lot of foods that are high in solid fats, added sugars, or sodium. Maintain a healthy weight Body mass index (BMI) is used to identify weight problems. It estimates body fat based on height and weight. Your health care provider can help determine your BMI and help you achieve or maintain a healthy weight. Get regular exercise Get regular exercise. This is one of the most important things you can do for your health. Most adults should:  Exercise for at least 150 minutes each week. The exercise should increase your heart rate and make you sweat (moderate-intensity exercise).  Do strengthening exercises at least twice a week. This is in addition to the moderate-intensity exercise.  Spend less time sitting. Even light physical activity can be beneficial. Watch cholesterol and blood lipids Have your blood tested for lipids and cholesterol at 56 years of age, then have this test every 5 years. Have your cholesterol levels checked more often if:  Your lipid or cholesterol levels are high.  You are older than 56 years of age.  You are at high risk for heart disease. What should I know about cancer screening? Depending on your health history and family history, you may need to have cancer screening at various ages. This may include screening for:  Breast cancer.  Cervical cancer.  Colorectal cancer.  Skin cancer.  Lung cancer. What should I know about heart disease, diabetes, and high blood  pressure? Blood pressure and heart disease  High blood pressure causes heart disease and increases the risk of stroke. This is more likely to develop in people who have high blood pressure readings, are of African descent, or are overweight.  Have your blood pressure checked: ? Every 3-5 years if you are 18-39 years of age. ? Every year if you are 40 years old or older. Diabetes Have regular diabetes screenings. This checks your fasting blood sugar level. Have the screening done:  Once every three years after age 40 if you are at a normal weight and have a low risk for diabetes.  More often and at a younger age if you are overweight or have a high risk for diabetes. What should I know about preventing infection? Hepatitis B If you have a higher risk for hepatitis B, you should be screened for this virus. Talk with your health care provider to find out if you are at risk for hepatitis B infection. Hepatitis C Testing is recommended for:  Everyone born from 1945 through 1965.  Anyone with known risk factors for hepatitis C. Sexually transmitted infections (STIs)  Get screened for STIs, including gonorrhea and chlamydia, if: ? You are sexually active and are younger than 56 years of age. ? You are older than 56 years of age and your health care provider tells you that you are at risk for this type of infection. ? Your sexual activity has changed since you were last screened, and you are at increased risk for chlamydia or gonorrhea. Ask your health care provider if   you are at risk.  Ask your health care provider about whether you are at high risk for HIV. Your health care provider may recommend a prescription medicine to help prevent HIV infection. If you choose to take medicine to prevent HIV, you should first get tested for HIV. You should then be tested every 3 months for as long as you are taking the medicine. Pregnancy  If you are about to stop having your period (premenopausal) and  you may become pregnant, seek counseling before you get pregnant.  Take 400 to 800 micrograms (mcg) of folic acid every day if you become pregnant.  Ask for birth control (contraception) if you want to prevent pregnancy. Osteoporosis and menopause Osteoporosis is a disease in which the bones lose minerals and strength with aging. This can result in bone fractures. If you are 65 years old or older, or if you are at risk for osteoporosis and fractures, ask your health care provider if you should:  Be screened for bone loss.  Take a calcium or vitamin D supplement to lower your risk of fractures.  Be given hormone replacement therapy (HRT) to treat symptoms of menopause. Follow these instructions at home: Lifestyle  Do not use any products that contain nicotine or tobacco, such as cigarettes, e-cigarettes, and chewing tobacco. If you need help quitting, ask your health care provider.  Do not use street drugs.  Do not share needles.  Ask your health care provider for help if you need support or information about quitting drugs. Alcohol use  Do not drink alcohol if: ? Your health care provider tells you not to drink. ? You are pregnant, may be pregnant, or are planning to become pregnant.  If you drink alcohol: ? Limit how much you use to 0-1 drink a day. ? Limit intake if you are breastfeeding.  Be aware of how much alcohol is in your drink. In the [place] one drink equals one 12 oz bottle of beer (355 mL), one 5 oz glass of wine (148 mL), or one 1 oz glass of hard liquor (44 mL). General instructions  Schedule regular health, dental, and eye exams.  Stay current with your vaccines.  Tell your health care provider if: ? You often feel depressed. ? You have ever been abused or do not feel safe at home. Summary  Adopting a healthy lifestyle and getting preventive care are important in promoting health and wellness.  Follow your health care provider's instructions about healthy  diet, exercising, and getting tested or screened for diseases.  Follow your health care provider's instructions on monitoring your cholesterol and blood pressure. This information is not intended to replace advice given to you by your health care provider. Make sure you discuss any questions you have with your health care provider. Document Released: 12/08/2010 Document Revised: 05/18/2018 Document Reviewed: 05/18/2018 [employer] Patient Education  2020 [employer]  

## 2018-12-27 NOTE — Progress Notes (Signed)
Subjective:  Patient ID: [name], female    DOB: Oct 09, 1960, 58 y.o.   MRN: [id number]  Patient Care Team: Baruch Gouty, FNP as PCP - General (Family Medicine) [name], MD as Consulting Physician (Orthopedic Surgery)   Chief Complaint:  Medical Management of Chronic Issues, Atrial Fibrillation, Diabetes, and Hypothyroidism   HPI: [name] is a 58 y.o. female presenting on 12/27/2018 for Medical Management of Chronic Issues, Atrial Fibrillation, Diabetes, and Hypothyroidism   1. DVT of axillary vein, chronic, bilateral (HCC)  On chronic anticoagulation. No swelling, shortness of breath, chest pain, dizziness, or syncope. Compliant with medications. Does not have INR checked regularly. Feels this is not necessary despite discussion about the importance of monitoring her INR.    2. Type 2 diabetes mellitus with diabetic peripheral angiopathy and gangrene, without long-term current use of insulin [facility])  Patient presents for follow up of diabetes. Current symptoms include: none. Symptoms have waxed and waned but are better overall. Patient denies foot ulcerations, increased appetite, nausea, polydipsia, polyuria, visual disturbances, vomiting and weight loss.   Known diabetic complications: nephropathy, cardiovascular disease, peripheral vascular disease and right BKA Cardiovascular risk factors: diabetes mellitus, dyslipidemia, hypertension, obesity (BMI >= 30 kg/m2), sedentary lifestyle and smoking/ tobacco exposure Current diabetic medications include oral agents (dual therapy): glimepiride (Amaryl), Glucophage.  Compliant with meds - No, prescribed metformin 1000 mg BID, only taking once daily.   Eye exam current (within one year): yes Weight trend: increasing steadily Prior visit with dietician: no, pt refuses Current diet: in general, an "unhealthy" diet Current exercise: walking, recent right BKA. Has been walking with walker and going to PT.  Current monitoring regimen: none  Is She on ACE inhibitor or angiotensin II receptor blocker?  Yes  losartan (Cozaar)   3. Acquired hypothyroidism  Compliant with medications. No nail, skin, or hair changes. No constipation or diarrhea. No fatigue or insomnia. Has had an increase in weight. Pt reports this is due to her recent right BKA. States she is finally out of the wheelchair and walking with a walker and this should help to drop some weight.    4. Vitamin D deficiency  On oral repletion therapy. No myalgias, muscle weakness, fatigue, fractures, or arthralgias.    5. Hypertension associated with chronic kidney disease due to type 2 diabetes mellitus ([place])  Complaint with meds - Yes Checking BP at home - No Exercising Regularly - No Watching Salt intake - No Pertinent ROS:  Headache - No Chest pain - No Dyspnea - No Palpitations - No LE edema - No They report good compliance with medications and can restate their regimen by memory. No medication side effects.  BP Readings from Last 3 Encounters:  12/27/18 92/69  08/16/18 116/80  08/08/18 135/79     6. Hyperlipidemia associated with type 2 diabetes mellitus ([place])  Compliant with statin, feels this is not helping to lower her cholesterol. She does not diet or exercise. No myalgias or arthralgias.    7. Mixed simple and mucopurulent chronic bronchitis (HCC)  Well controlled. Only uses SABA when needed. Still smoking 0.5-1 PPD. No increased cough, shortness of breath, or orthopnea. No fever, chills, weakness, confusion, or chest pain.    8. S/P unilateral BKA (below knee amputation), right ([place])  Pt doing very well. Pt is ambulating with a walker. Prosthetic fits well per pt report. Stump looks good, no erythema or drainage. Pt states she does have some swelling in  the stump at times. She does report intermittent phantom leg pain. Reports this is not often and is manageable.     9. Long term current use of anticoagulant   Compliant with medications but does not have INR checked regularly as she feels this is not important. No abnormal bleeding or bruising.    10. Gastroesophageal reflux disease without esophagitis  She reports breakthrough reflux symptoms. States she is taking her medications as prescribed but has noticed an increase in reflux symptoms at night. No sore throat, dysphagia, voice change, or hemoptysis. No melena or hemoptysis.    11. Medically noncompliant  Does not follow up with PCP as scheduled and recommended. Does not have INR checked as recommended.      Relevant past medical, surgical, family, and social history reviewed and updated as indicated.  Allergies and medications reviewed and updated. Date reviewed: Chart in Epic.   Past Medical History:  Diagnosis Date  . COPD (chronic obstructive pulmonary disease) ([place])   . Depression   . DM (diabetes mellitus) ([place])   . DVT (deep venous thrombosis) (HCC)    x5  . Family history of colon cancer   . GERD (gastroesophageal reflux disease)   . Hyperlipemia   . Hypertension   . Hypothyroid   . Obesity   . Osteomyelitis of great toe of right foot ([place]) 07/04/2018  . PVD (peripheral vascular disease) ([place])   . Unilateral complete BKA, right, initial encounter ([place]) 07/11/2018  . Unilateral complete BKA, right, subsequent encounter ([place])   . Uterine cancer ([place])   . UTI (urinary tract infection)     Past Surgical History:  Procedure Laterality Date  . ABDOMINAL HYSTERECTOMY    . AMPUTATION Right 07/08/2018   Procedure: RIGHT BELOW KNEE AMPUTATION;  Surgeon: [name], MD;  Location: [facility];  Service: Orthopedics;  Laterality: Right;  . CESAREAN SECTION    . FEMORAL BYPASS     x 5  . LUMBAR DISC SURGERY     L4-L5  . TOE AMPUTATION     right  . TONSILLECTOMY      Social History   Socioeconomic History  . Marital status: Married    Spouse name: Kasandra Knudsen  . Number of children: 1  . Years of education: 4  . Highest  education level: Master's degree (e.g., MA, MS, MEng, MEd, MSW, MBA)  Occupational History  . Occupation: Product manager: [employer]  . Occupation: retired  Scientific laboratory technician  . Financial resource strain: Not hard at all  . Food insecurity    Worry: Never true    Inability: Never true  . Transportation needs    Medical: No    Non-medical: No  Tobacco Use  . Smoking status: Current Every Day Smoker    Packs/day: 0.50    Years: 38.00    Pack years: 19.00    Types: Cigarettes  . Smokeless tobacco: Never Used  Substance and Sexual Activity  . Alcohol use: No    Alcohol/week: 0.0 standard drinks  . Drug use: Yes    Frequency: 4.0 times per week    Types: Marijuana  . Sexual activity: Not Currently    Birth control/protection: Surgical  Lifestyle  . Physical activity    Days per week: 0 days    Minutes per session: 0 min  . Stress: Not at all  Relationships  . Social connections    Talks on phone: More than three times a week    Gets  together: More than three times a week    Attends religious service: Never    Active member of club or organization: No    Attends meetings of clubs or organizations: Never    Relationship status: Married  . Intimate partner violence    Fear of current or ex partner: No    Emotionally abused: No    Physically abused: No    Forced sexual activity: No  Other Topics Concern  . Not on file  Social History Narrative  . Not on file    Outpatient Encounter Medications as of 12/27/2018  Medication Sig  . acetaminophen (TYLENOL) 325 MG tablet Take 1-2 tablets (325-650 mg total) by mouth every 4 (four) hours as needed for mild pain.  Marland Kitchen albuterol (PROVENTIL HFA;VENTOLIN HFA) 108 (90 Base) MCG/ACT inhaler Inhale 2 puffs into the lungs every 6 (six) hours as needed for wheezing or shortness of breath.  Marland Kitchen aspirin EC 81 MG EC tablet Take 1 tablet (81 mg total) by mouth daily.  Marland Kitchen atorvastatin (LIPITOR) 40 MG tablet Take 1 tablet (40 mg total) by  mouth daily. (Needs to be seen before next refill)  . Cholecalciferol (VITAMIN D3) 5000 units TABS Take 1 tablet by mouth daily.  . clopidogrel (PLAVIX) 75 MG tablet Take 1 tablet (75 mg total) by mouth daily. (Needs to be seen before next refill)  . furosemide (LASIX) 20 MG tablet Take 1 tablet (20 mg total) by mouth daily. (Needs to be seen before next refill)  . glimepiride (AMARYL) 2 MG tablet Take 1 tablet (2 mg total) by mouth daily before breakfast.  . levothyroxine (SYNTHROID, LEVOTHROID) 100 MCG tablet Take 1 tablet (100 mcg total) by mouth daily before breakfast. (Needs to be seen before next refill)  . losartan (COZAAR) 50 MG tablet Take 1 tablet (50 mg total) by mouth daily. (Needs to be seen before next refill)  . metFORMIN (GLUCOPHAGE) 1000 MG tablet Take 1 tablet (1,000 mg total) by mouth 2 (two) times daily with a meal.  . ONE TOUCH ULTRA TEST test strip TEST BLOOD SUGARS TWICE A DAY  . tretinoin (RETIN-A) 0.05 % cream Apply topically at bedtime.  . triamcinolone cream (KENALOG) 0.1 % Apply topically 2 (two) times daily.  Marland Kitchen warfarin (COUMADIN) 5 MG tablet TAKE AS DIRECTED UP TO 1 & 1/2 TABLETS A DAY  . [DISCONTINUED] omeprazole (PRILOSEC) 20 MG capsule Take 1 capsule (20 mg total) by mouth daily. (Needs to be seen before next refill)  . pantoprazole (PROTONIX) 40 MG tablet Take 1 tablet (40 mg total) by mouth daily.  . [DISCONTINUED] metFORMIN (GLUCOPHAGE-XR) 500 MG 24 hr tablet    No facility-administered encounter medications on file as of 12/27/2018.     Allergies  Allergen Reactions  . Aleve [Naproxen Sodium] Hives and Itching  . Cortisone Swelling    Internal organs  . Prednisone Swelling    Makes internal organs swell  . Vancomycin Itching    Review of Systems  Constitutional: Positive for activity change. Negative for appetite change, chills, diaphoresis, fatigue, fever and unexpected weight change.  Eyes: Negative for photophobia and visual disturbance.   Respiratory: Positive for cough (baseline). Negative for chest tightness and shortness of breath.   Cardiovascular: Negative for chest pain, palpitations and leg swelling.  Gastrointestinal: Negative for abdominal pain, anal bleeding, blood in stool, constipation, diarrhea, nausea, rectal pain and vomiting.  Endocrine: Negative for cold intolerance, heat intolerance, polydipsia, polyphagia and polyuria.  Genitourinary: Negative for decreased urine volume,  difficulty urinating and hematuria.  Musculoskeletal: Positive for gait problem (Right BKA, using walker to ambulate).  Skin: Negative for color change.  Neurological: Negative for dizziness, tremors, seizures, syncope, facial asymmetry, speech difficulty, weakness, light-headedness, numbness and headaches.  Hematological: Does not bruise/bleed easily.  Psychiatric/Behavioral: Negative for confusion.  All other systems reviewed and are negative.       Objective:  BP 92/69   Pulse 73   Temp 98.6 F (37 C)   Ht 5' 10"  (1.778 m)   Wt 239 lb (108.4 kg)   BMI 34.29 kg/m    Wt Readings from Last 3 Encounters:  12/27/18 239 lb (108.4 kg)  12/05/18 221 lb (100.2 kg)  10/03/18 221 lb (100.2 kg)    Physical Exam Vitals signs and nursing note reviewed.  Constitutional:      General: She is not in acute distress.    Appearance: Normal appearance. She is well-developed and well-groomed. She is obese. She is not ill-appearing, toxic-appearing or diaphoretic.     Comments: Pt looks well today. Ambulating well with prosthetic and walker.  HENT:     Head: Normocephalic and atraumatic.     Jaw: There is normal jaw occlusion.     Right Ear: Hearing normal.     Left Ear: Hearing normal.     Nose: Nose normal.     Mouth/Throat:     Lips: Pink.     Mouth: Mucous membranes are moist.     Pharynx: Oropharynx is clear. Uvula midline.  Eyes:     General: Lids are normal.     Extraocular Movements: Extraocular movements intact.      Conjunctiva/sclera: Conjunctivae normal.     Pupils: Pupils are equal, round, and reactive to light.  Neck:     Musculoskeletal: Normal range of motion and neck supple.     Thyroid: No thyroid mass, thyromegaly or thyroid tenderness.     Vascular: No carotid bruit or JVD.     Trachea: Trachea and phonation normal.  Cardiovascular:     Rate and Rhythm: Normal rate and regular rhythm.     Chest Wall: PMI is not displaced.     Heart sounds: Normal heart sounds. No murmur. No friction rub. No gallop.   Pulmonary:     Effort: Pulmonary effort is normal. No respiratory distress.     Breath sounds: Examination of the right-lower field reveals rhonchi. Examination of the left-lower field reveals rhonchi. Rhonchi (clears with cough) present. No wheezing.  Abdominal:     General: Bowel sounds are normal. There is no distension or abdominal bruit.     Palpations: Abdomen is soft. There is no hepatomegaly or splenomegaly.     Tenderness: There is no abdominal tenderness. There is no right CVA tenderness or left CVA tenderness.     Hernia: No hernia is present.  Musculoskeletal:     Left lower leg: No edema.     Comments: RBKA  Lymphadenopathy:     Cervical: No cervical adenopathy.  Skin:    General: Skin is warm and dry.     Capillary Refill: Capillary refill takes less than 2 seconds.     Coloration: Skin is not cyanotic, jaundiced or pale.     Findings: No rash.     Comments: Scattered scars from previous lesions / wounds, no erythema or drainage.   Neurological:     General: No focal deficit present.     Mental Status: She is alert and oriented to person, place, and  time.     Cranial Nerves: Cranial nerves are intact.     Sensory: Sensation is intact.     Motor: Motor function is intact.     Coordination: Coordination is intact.     Gait: Gait abnormal (RBKA, prosthetic and walker).  Psychiatric:        Attention and Perception: Attention and perception normal.        Mood and Affect:  Mood and affect normal.        Speech: Speech normal.        Behavior: Behavior normal. Behavior is cooperative.        Thought Content: Thought content normal.        Cognition and Memory: Cognition and memory normal.        Judgment: Judgment normal.     Results for orders placed or performed in visit on 12/27/18  CBC with Differential/Platelet  Result Value Ref Range   WBC 12.1 (H) 3.4 - 10.8 x10E3/uL   RBC 5.04 3.77 - 5.28 x10E6/uL   Hemoglobin 13.7 11.1 - 15.9 g/dL   Hematocrit 41.7 34.0 - 46.6 %   MCV 83 79 - 97 fL   MCH 27.2 26.6 - 33.0 pg   MCHC 32.9 31.5 - 35.7 g/dL   RDW 15.7 (H) 11.7 - 15.4 %   Platelets 239 150 - 450 x10E3/uL   Neutrophils 54 Not Estab. %   Lymphs 37 Not Estab. %   Monocytes 7 Not Estab. %   Eos 1 Not Estab. %   Basos 1 Not Estab. %   Neutrophils Absolute 6.5 1.4 - 7.0 x10E3/uL   Lymphocytes Absolute 4.5 (H) 0.7 - 3.1 x10E3/uL   Monocytes Absolute 0.9 0.1 - 0.9 x10E3/uL   EOS (ABSOLUTE) 0.1 0.0 - 0.4 x10E3/uL   Basophils Absolute 0.1 0.0 - 0.2 x10E3/uL   Immature Granulocytes 0 Not Estab. %   Immature Grans (Abs) 0.0 0.0 - 0.1 x10E3/uL  CMP14+EGFR  Result Value Ref Range   Glucose 61 (L) 65 - 99 mg/dL   BUN 15 6 - 24 mg/dL   Creatinine, Ser 0.68 0.57 - 1.00 mg/dL   GFR calc non Af Amer 98 >59 mL/min/1.73   GFR calc Af Amer 113 >59 mL/min/1.73   BUN/Creatinine Ratio 22 9 - 23   Sodium 139 134 - 144 mmol/L   Potassium 4.0 3.5 - 5.2 mmol/L   Chloride 103 96 - 106 mmol/L   CO2 20 20 - 29 mmol/L   Calcium 9.5 8.7 - 10.2 mg/dL   Total Protein 6.8 6.0 - 8.5 g/dL   Albumin 3.6 (L) 3.8 - 4.9 g/dL   Globulin, Total 3.2 1.5 - 4.5 g/dL   Albumin/Globulin Ratio 1.1 (L) 1.2 - 2.2   Bilirubin Total <0.2 0.0 - 1.2 mg/dL   Alkaline Phosphatase 62 39 - 117 IU/L   AST 15 0 - 40 IU/L   ALT 15 0 - 32 IU/L  Lipid panel  Result Value Ref Range   Cholesterol, Total 150 100 - 199 mg/dL   Triglycerides 215 (H) 0 - 149 mg/dL   HDL 29 (L) >39 mg/dL   VLDL  Cholesterol Cal 43 (H) 5 - 40 mg/dL   LDL Calculated 78 0 - 99 mg/dL   Chol/HDL Ratio 5.2 (H) 0.0 - 4.4 ratio  CoaguChek XS/INR Waived  Result Value Ref Range   INR 1.9 (H) 0.9 - 1.1   Prothrombin Time 22.4 sec  Bayer DCA Hb A1c Waived  Result Value Ref  Range   HB A1C (BAYER DCA - WAIVED) 7.6 (H) <7.0 %  Thyroid Panel With TSH  Result Value Ref Range   TSH 1.150 0.450 - 4.500 uIU/mL   T4, Total 7.5 4.5 - 12.0 ug/dL   T3 Uptake Ratio 28 24 - 39 %   Free Thyroxine Index 2.1 1.2 - 4.9  VITAMIN D 25 Hydroxy (Vit-D Deficiency, Fractures)  Result Value Ref Range   Vit D, 25-Hydroxy WILL FOLLOW   Specimen status report  Result Value Ref Range   specimen status report Comment   POCT INR  Result Value Ref Range   INR 1.9 (A) 2.0 - 3.0       Pertinent labs & imaging results that were available during my care of the patient were reviewed by me and considered in my medical decision making.  Assessment & Plan:  Fendi was seen today for medical management of chronic issues, atrial fibrillation, diabetes and hypothyroidism.  Diagnoses and all orders for this visit:  DVT of axillary vein, chronic, bilateral (HCC) No leg swelling, calf pain, shortness of breath, chest pain, dizziness, or syncope. INR 1.9 today, at goal. No change in therapy. Follow up in 4 weeks for repeat INR.  -     CBC with Differential/Platelet -     CoaguChek XS/INR Waived -     POCT INR  Type 2 diabetes mellitus with diabetic peripheral angiopathy and gangrene, without long-term current use of insulin (HCC) A1C 7.6 today, down from 8.7. Pt has not been taking metformin as prescribed. Has only been taking once daily. Will start taking twice daily as prescribed. Diet and exercise encouraged. Follow up in 3 months.  -     Bayer DCA Hb A1c Waived  Acquired hypothyroidism -     Thyroid Panel With TSH  Vitamin D deficiency -     VITAMIN D 25 Hydroxy (Vit-D Deficiency, Fractures)  Hypertension associated with  chronic kidney disease due to type 2 diabetes mellitus (HCC) -     CBC with Differential/Platelet -     CMP14+EGFR  Hyperlipidemia associated with type 2 diabetes mellitus (HCC) -     Lipid panel  Mixed simple and mucopurulent chronic bronchitis (HCC)  S/P unilateral BKA (below knee amputation), right ([place])  Long term current use of anticoagulant -     POCT INR  Gastroesophageal reflux disease without esophagitis -     pantoprazole (PROTONIX) 40 MG tablet; Take 1 tablet (40 mg total) by mouth daily.   Total time spent with patient 40 mintues.  Greater than 50% of encounter spent in coordination of care/counseling.   Continue all other maintenance medications.  Total time spent with patient 40 minutes.  Greater than 50% of encounter spent in coordination of care/counseling.  Follow up plan: Return in about 4 weeks (around 01/24/2019), or if symptoms worsen or fail to improve, for INR / 3 mths for DM, HTN.  Educational handout given for Health maintenance  The above assessment and management plan was discussed with the patient. The patient verbalized understanding of and has agreed to the management plan. Patient is aware to call the clinic if symptoms persist or worsen. Patient is aware when to return to the clinic for a follow-up visit. Patient educated on when it is appropriate to go to the emergency department.   Monia Pouch, FNP-C [facility] [phone] 12/28/18

## 2018-12-27 NOTE — Telephone Encounter (Signed)
FYI

## 2018-12-28 LAB — CMP14+EGFR
ALT: 15 IU/L (ref 0–32)
AST: 15 IU/L (ref 0–40)
Albumin/Globulin Ratio: 1.1 — ABNORMAL LOW (ref 1.2–2.2)
Albumin: 3.6 g/dL — ABNORMAL LOW (ref 3.8–4.9)
Alkaline Phosphatase: 62 IU/L (ref 39–117)
BUN/Creatinine Ratio: 22 (ref 9–23)
BUN: 15 mg/dL (ref 6–24)
Bilirubin Total: <0.2 mg/dL (ref 0.0–1.2)
CO2: 20 mmol/L (ref 20–29)
Calcium: 9.5 mg/dL (ref 8.7–10.2)
Chloride: 103 mmol/L (ref 96–106)
Creatinine, Ser: 0.68 mg/dL (ref 0.57–1.00)
GFR calc Af Amer: 113 mL/min/{1.73_m2} (ref >59)
GFR calc non Af Amer: 98 mL/min/{1.73_m2} (ref >59)
Globulin, Total: 3.2 g/dL (ref 1.5–4.5)
Glucose: 61 mg/dL — ABNORMAL LOW (ref 65–99)
Potassium: 4 mmol/L (ref 3.5–5.2)
Sodium: 139 mmol/L (ref 134–144)
Total Protein: 6.8 g/dL (ref 6.0–8.5)

## 2018-12-28 LAB — LIPID PANEL
Chol/HDL Ratio: 5.2 ratio — ABNORMAL HIGH (ref 0.0–4.4)
Cholesterol, Total: 150 mg/dL (ref 100–199)
HDL: 29 mg/dL — ABNORMAL LOW (ref >39)
LDL Calculated: 78 mg/dL (ref 0–99)
Triglycerides: 215 mg/dL — ABNORMAL HIGH (ref 0–149)
VLDL Cholesterol Cal: 43 mg/dL — ABNORMAL HIGH (ref 5–40)

## 2018-12-28 LAB — CBC WITH DIFFERENTIAL/PLATELET
Basophils Absolute: 0.1 10*3/uL (ref 0.0–0.2)
Basos: 1 %
EOS (ABSOLUTE): 0.1 10*3/uL (ref 0.0–0.4)
Eos: 1 %
Hematocrit: 41.7 % (ref 34.0–46.6)
Hemoglobin: 13.7 g/dL (ref 11.1–15.9)
Immature Grans (Abs): 0 10*3/uL (ref 0.0–0.1)
Immature Granulocytes: 0 %
Lymphocytes Absolute: 4.5 10*3/uL — ABNORMAL HIGH (ref 0.7–3.1)
Lymphs: 37 %
MCH: 27.2 pg (ref 26.6–33.0)
MCHC: 32.9 g/dL (ref 31.5–35.7)
MCV: 83 fL (ref 79–97)
Monocytes Absolute: 0.9 10*3/uL (ref 0.1–0.9)
Monocytes: 7 %
Neutrophils Absolute: 6.5 10*3/uL (ref 1.4–7.0)
Neutrophils: 54 %
Platelets: 239 10*3/uL (ref 150–450)
RBC: 5.04 x10E6/uL (ref 3.77–5.28)
RDW: 15.7 % — ABNORMAL HIGH (ref 11.7–15.4)
WBC: 12.1 10*3/uL — ABNORMAL HIGH (ref 3.4–10.8)

## 2018-12-28 LAB — THYROID PANEL WITH TSH
Free Thyroxine Index: 2.1 (ref 1.2–4.9)
T3 Uptake Ratio: 28 % (ref 24–39)
T4, Total: 7.5 ug/dL (ref 4.5–12.0)
TSH: 1.15 u[IU]/mL (ref 0.450–4.500)

## 2018-12-29 ENCOUNTER — Ambulatory Visit: Payer: Medicare Other | Admitting: Physical Therapy

## 2018-12-29 ENCOUNTER — Other Ambulatory Visit: Payer: Self-pay | Admitting: *Deleted

## 2018-12-29 LAB — VITAMIN D 25 HYDROXY (VIT D DEFICIENCY, FRACTURES): Vit D, 25-Hydroxy: 72.2 ng/mL (ref 30.0–100.0)

## 2018-12-29 LAB — SPECIMEN STATUS REPORT

## 2018-12-29 MED ORDER — LEVOTHYROXINE SODIUM 100 MCG PO TABS: 100.0000 ug | ORAL_TABLET | Freq: Every day | ORAL | 3 refills | Status: DC

## 2018-12-29 MED ORDER — VASCEPA 1 G PO CAPS: 2.0000 g | ORAL_CAPSULE | Freq: Two times a day (BID) | ORAL | 1 refills | Status: DC

## 2018-12-29 MED ORDER — CLOPIDOGREL BISULFATE 75 MG PO TABS: 75.0000 mg | ORAL_TABLET | Freq: Every day | ORAL | 2 refills | Status: DC

## 2018-12-29 MED ORDER — LOSARTAN POTASSIUM 50 MG PO TABS: 50.0000 mg | ORAL_TABLET | Freq: Every day | ORAL | 5 refills | Status: DC

## 2018-12-29 NOTE — Addendum Note (Signed)
Addended by: Baruch Gouty on: 12/29/2018 04:53 PM   Modules accepted: Orders

## 2018-12-30 ENCOUNTER — Other Ambulatory Visit: Payer: Self-pay | Admitting: Family Medicine

## 2018-12-30 ENCOUNTER — Telehealth: Payer: Self-pay | Admitting: Family Medicine

## 2018-12-30 DIAGNOSIS — E785 Hyperlipidemia, unspecified: Secondary | ICD-10-CM

## 2018-12-30 DIAGNOSIS — E1169 Type 2 diabetes mellitus with other specified complication: Secondary | ICD-10-CM

## 2018-12-30 MED ORDER — PRAVASTATIN SODIUM 20 MG PO TABS: 20.0000 mg | ORAL_TABLET | Freq: Every day | ORAL | 3 refills | Status: DC

## 2018-12-30 NOTE — Telephone Encounter (Signed)
I will switch the lipitor to pravachol, it is still a statin, but tends to have less side effects. I will send in the RX. She needs to take the statin and fish oil to control her cholesterol and triglycerides.

## 2018-12-30 NOTE — Telephone Encounter (Signed)
Patient wants to know about stopping the lipitor due to the muscle aches. Patient states it was discussed at appointment about switching lipitor to something else with less side effects and not starting any new medications. Patient is in agreement to the fish oil if not expensive.

## 2018-12-30 NOTE — Telephone Encounter (Signed)
Pt aware.

## 2019-01-02 ENCOUNTER — Other Ambulatory Visit: Payer: Self-pay

## 2019-01-02 ENCOUNTER — Ambulatory Visit: Payer: Medicare Other | Admitting: Physical Therapy

## 2019-01-02 DIAGNOSIS — R2689 Other abnormalities of gait and mobility: Secondary | ICD-10-CM | POA: Diagnosis not present

## 2019-01-02 DIAGNOSIS — M6281 Muscle weakness (generalized): Secondary | ICD-10-CM | POA: Diagnosis not present

## 2019-01-02 DIAGNOSIS — R293 Abnormal posture: Secondary | ICD-10-CM | POA: Diagnosis not present

## 2019-01-02 DIAGNOSIS — R2681 Unsteadiness on feet: Secondary | ICD-10-CM

## 2019-01-02 NOTE — Therapy (Signed)
[facility] [place], [place], [address] Phone: [phone]   Fax:  [phone]  Physical Therapy Treatment  Patient Details  Name: [name] MRN: [id number] Date of Birth: 23-Mar-1961 Referring Provider (PT): [name], MD   Encounter Date: 01/02/2019  PT End of Session - 01/02/19 1126    Visit Number  7    Number of Visits  25    Date for PT Re-Evaluation  03/07/19    Authorization Type  Medicare Part A & B    Authorization Time Period  20% co-pay after meeting deductible    PT Start Time  1116    PT Stop Time  1208    PT Time Calculation (min)  52 min    Equipment Utilized During Treatment  Gait belt    Activity Tolerance  Patient tolerated treatment well;Patient limited by fatigue    Behavior During Therapy  [facility] for tasks assessed/performed       Past Medical History:  Diagnosis Date  . COPD (chronic obstructive pulmonary disease) ([place])   . Depression   . DM (diabetes mellitus) ([place])   . DVT (deep venous thrombosis) ([facility])    x5  . Family history of colon cancer   . GERD (gastroesophageal reflux disease)   . Hyperlipemia   . Hypertension   . Hypothyroid   . Obesity   . Osteomyelitis of great toe of right foot ([place]) 07/04/2018  . PVD (peripheral vascular disease) ([place])   . Unilateral complete BKA, right, initial encounter ([place]) 07/11/2018  . Unilateral complete BKA, right, subsequent encounter ([place])   . Uterine cancer ([place])   . UTI (urinary tract infection)     Past Surgical History:  Procedure Laterality Date  . ABDOMINAL HYSTERECTOMY    . AMPUTATION Right 07/08/2018   Procedure: RIGHT BELOW KNEE AMPUTATION;  Surgeon: [name], MD;  Location: [place];  Service: Orthopedics;  Laterality: Right;  . CESAREAN SECTION    . FEMORAL BYPASS     x 5  . LUMBAR DISC SURGERY     L4-L5  . TOE AMPUTATION     right  . TONSILLECTOMY      There were no vitals filed for this visit.  Subjective  Assessment - 01/02/19 1121    Subjective  COVID-19 screening performed at arrival. Patient reports having a good day today and walked in without assistance of an AD. Reports she didn't wear her prosthetic yesterday due to increased stump pain.    Pertinent History  R TTA, COPD, depression, DM, HTN, HLD, obesity, uterine CA, L4-5 decompression sg    Patient Stated Goals  She wants to use prosthesis walk, grocery shop, drive, cook standing    Currently in Pain?  No/denies                       [facility] Adult PT Treatment/Exercise - 01/02/19 0001      Ambulation/Gait   Ambulation/Gait  Yes    Ambulation/Gait Assistance  5: Supervision    Ambulation Distance (Feet)  279 Feet    Assistive device  Prosthesis;Straight cane    Gait Pattern  Step-through pattern;Decreased arm swing - right;Decreased step length - left;Decreased stance time - right;Decreased hip/knee flexion - right;Lateral hip instability;Abducted- right;Decreased weight shift to right;Right circumduction;Trendelenburg    Ambulation Surface  Level;Unlevel    Ramp  5: Supervision    Ramp Details (indicate cue type and reason)  verbal cuing for toe down  to ascend.      Exercises   Exercises  Knee/Hip      Knee/Hip Exercises: Aerobic   Nustep  Level 5 x12 minutes with UE      Knee/Hip Exercises: Standing   Other Standing Knee Exercises  lateral stepping on beam x3 mins      Knee/Hip Exercises: Seated   Sit to Sand  2 sets;10 reps;without UE support      Knee/Hip Exercises: Supine   Straight Leg Raises  Strengthening;Right;2 sets;10 reps    Straight Leg Raise with External Rotation  Strengthening;Right;2 sets;10 reps               PT Short Term Goals - 12/26/18 1147      PT SHORT TERM GOAL #1   Title  Patient demonstrates proper donning & verbalizes proper cleaning of prosthesis. (All STGs Target Date: 01/06/2019)    Time  4    Period  Weeks    Status  Achieved    Target Date  01/06/19      PT  SHORT TERM GOAL #2   Title  Patient tolerates prosthesis wear >12hrs total/day with no increase in wound size    Time  4    Period  Weeks    Status  On-going    Target Date  01/06/19      PT SHORT TERM GOAL #3   Title  Patient ambulates 250' with cane & prosthesis with minA.    Time  4    Period  Weeks    Status  On-going      PT SHORT TERM GOAL #4   Title  Patient negotiates stairs single rail, ramps & curbs with cane & prosthesis with minA.    Time  4    Period  Weeks    Status  On-going      PT SHORT TERM GOAL #5   Title  Patient lifts 10# box from floor with supervision.    Time  4    Status  Achieved    Target Date  01/06/19        PT Long Term Goals - 12/13/18 0749      PT LONG TERM GOAL #1   Title  Patient demonstrates & verbalizes proper prosthetic care to enable safe utilization of prosthesis. (All LTGs Target Date: 03/03/2019)    Time  12    Period  Weeks    Status  On-going    Target Date  03/03/19      PT LONG TERM GOAL #2   Title  Patient tolerates wear of prosthesis >90% of awake hours without skin or limb pain issues to enable function throughout her day.    Time  12    Period  Weeks    Status  On-going    Target Date  03/03/19      PT LONG TERM GOAL #3   Title  Berg Balance >/= 45/56 to indicate lower fall risk.    Time  12    Period  Weeks    Status  On-going    Target Date  03/03/19      PT LONG TERM GOAL #4   Title  Patient ambulates 500 outdoors including grass' with cane or less and prosthesis modified independent to enable community mobility.    Time  12    Period  Weeks    Status  On-going    Target Date  03/03/19      PT LONG TERM  GOAL #5   Title  Patient negotiates ramps, curbs and stairs single rail with cane or less & prosthesis to enable community access.    Time  12    Period  Weeks    Status  On-going    Target Date  03/03/19      PT LONG TERM GOAL #6   Title  Patient ambulates around furniture carrying household items  with prosthesis only modified independent.    Time  12    Period  Weeks    Status  On-going    Target Date  03/03/19      PT LONG TERM GOAL #7   Title  Dynamic Gait Index with cane & prosthesis >16/24.    Time  12    Period  Weeks    Status  On-going    Target Date  03/03/19            Plan - 01/02/19 1239    Clinical Impression Statement  Patient was able to tolerate treatment well and with no reports of right stump pain or discomfort. Patient was able to ambulate around the clinic at 279 feet with no AD but still with gait devations. Deviations intensified with fatigue. Patient still requires supervision for ramp negotiation but reports feeling more comfortable on this ramp than at home.    Personal Factors and Comorbidities  Comorbidity 3+;Fitness;Past/Current Experience;Time since onset of injury/illness/exacerbation    Comorbidities  R TTA, COPD, depression, DM, HTN, HLD, obesity, uterine CA, L4-5 decompression sg    Examination-Activity Limitations  Caring for Others;Locomotion Level;Reach Overhead;Stairs;Stand;Transfers    Examination-Participation Restrictions  Community Activity;Driving;Meal Prep;Yard Work;Volunteer    PT Frequency  2x / week    PT Duration  12 weeks    PT Treatment/Interventions  ADLs/Self Care Home Management;Gait training;Stair training;Functional mobility training;Therapeutic activities;Therapeutic exercise;Balance training;Neuromuscular re-education;Patient/family education;Prosthetic Training;Vestibular    PT Next Visit Plan  Continue with prosthetic education, gait & balance towards STGs, bilateral LE strengthening, gait training with proper gait mechanics.    PT Home Exercise Plan  see patient education section    Consulted and Agree with Plan of Care  Patient       Patient will benefit from skilled therapeutic intervention in order to improve the following deficits and impairments:  Abnormal gait, Decreased activity tolerance, Decreased balance,  Decreased endurance, Decreased knowledge of use of DME, Decreased mobility, Decreased strength, Decreased skin integrity, Impaired flexibility, Postural dysfunction, Prosthetic Dependency  Visit Diagnosis: 1. Other abnormalities of gait and mobility   2. Muscle weakness (generalized)   3. Unsteadiness on feet        Problem List Patient Active Problem List   Diagnosis Date Noted  . Vitamin D deficiency 12/27/2018  . Type 2 diabetes mellitus with diabetic peripheral angiopathy and gangrene, without long-term current use of insulin ([place]) 12/27/2018  . DVT of axillary vein, chronic, bilateral ([place]) 12/27/2018  . Hypertension associated with chronic kidney disease due to type 2 diabetes mellitus ([place]) 12/27/2018  . Long term current use of anticoagulant 12/27/2018  . Medically noncompliant 12/27/2018  . S/P unilateral BKA (below knee amputation), right ([place])   . Subtherapeutic international normalized ratio (INR)   . Noncompliance   . Leukocytosis   . Acute blood loss anemia   . Diabetes mellitus type 2 in obese ([place])   . Acquired absence of right leg below knee ([place]) 07/08/2018  . Mild protein-calorie malnutrition ([place])   . Intermittent claudication ([place]) 05/11/2017  . Deep vein thrombosis (  DVT) of both lower extremities ([place]) 01/05/2017  . Peripheral vascular insufficiency ([place]) 09/25/2015  . Hypothyroidism 01/29/2014  . Obesity (BMI 30-39.9) 08/16/2013  . Impetigo 07/28/2013  . Chronic obstructive pulmonary disease ([place])   . Malignant neoplasm of uterus ([place])   . GERD (gastroesophageal reflux disease) 10/18/2012  . Hernia, hiatal 10/18/2012  . Hyperlipidemia associated with type 2 diabetes mellitus ([place]) 04/18/2008  . SMOKER 04/18/2008   [name], PT, DPT 01/02/2019, 12:51 PM  [facility] [place], [place], [address] Phone: [phone]   Fax:  [phone]  Name: [name] MRN:  [id number] Date of Birth: March 28, 1961

## 2019-01-05 ENCOUNTER — Other Ambulatory Visit: Payer: Self-pay

## 2019-01-05 ENCOUNTER — Ambulatory Visit: Payer: Medicare Other | Admitting: Physical Therapy

## 2019-01-05 DIAGNOSIS — R2681 Unsteadiness on feet: Secondary | ICD-10-CM | POA: Diagnosis not present

## 2019-01-05 DIAGNOSIS — R293 Abnormal posture: Secondary | ICD-10-CM | POA: Diagnosis not present

## 2019-01-05 DIAGNOSIS — R2689 Other abnormalities of gait and mobility: Secondary | ICD-10-CM

## 2019-01-05 DIAGNOSIS — M6281 Muscle weakness (generalized): Secondary | ICD-10-CM

## 2019-01-05 NOTE — Therapy (Signed)
[facility] [place], [place], [address] Phone: [phone]   Fax:  [phone]  Physical Therapy Treatment  Patient Details  Name: [name] MRN: [id number] Date of Birth: 1961/05/16 Referring Provider (PT): [name], MD   Encounter Date: 01/05/2019  PT End of Session - 01/05/19 1159    Visit Number  8    Number of Visits  25    Date for PT Re-Evaluation  03/07/19    Authorization Type  Medicare Part A & B    Authorization Time Period  20% co-pay after meeting deductible    PT Start Time  1115    PT Stop Time  1206    PT Time Calculation (min)  51 min    Activity Tolerance  Patient tolerated treatment well;Patient limited by fatigue    Behavior During Therapy  [facility] for tasks assessed/performed       Past Medical History:  Diagnosis Date  . COPD (chronic obstructive pulmonary disease) ([place])   . Depression   . DM (diabetes mellitus) ([place])   . DVT (deep venous thrombosis) ([facility])    x5  . Family history of colon cancer   . GERD (gastroesophageal reflux disease)   . Hyperlipemia   . Hypertension   . Hypothyroid   . Obesity   . Osteomyelitis of great toe of right foot ([place]) 07/04/2018  . PVD (peripheral vascular disease) ([place])   . Unilateral complete BKA, right, initial encounter ([place]) 07/11/2018  . Unilateral complete BKA, right, subsequent encounter ([place])   . Uterine cancer ([place])   . UTI (urinary tract infection)     Past Surgical History:  Procedure Laterality Date  . ABDOMINAL HYSTERECTOMY    . AMPUTATION Right 07/08/2018   Procedure: RIGHT BELOW KNEE AMPUTATION;  Surgeon: [name], MD;  Location: [facility];  Service: Orthopedics;  Laterality: Right;  . CESAREAN SECTION    . FEMORAL BYPASS     x 5  . LUMBAR DISC SURGERY     L4-L5  . TOE AMPUTATION     right  . TONSILLECTOMY      There were no vitals filed for this visit.  Subjective Assessment - 01/05/19 1334    Subjective  COVID-19  screening performed at arrival. Arrives feeling good but required 2 3 ply socks to be comfortable. Patient reported a rash in her inner thigh and a "welt" near the crease of her knee since Monday.    Pertinent History  R TTA, COPD, depression, DM, HTN, HLD, obesity, uterine CA, L4-5 decompression sg    Patient Stated Goals  She wants to use prosthesis walk, grocery shop, drive, cook standing    Currently in Pain?  No/denies         [facility] PT Assessment - 01/05/19 0001      Assessment   Medical Diagnosis  Right Transtibial Amputation    Referring Provider (PT)  [name], MD    Onset Date/Surgical Date  11/16/18    Hand Dominance  Left    Prior Therapy  none      Precautions   Precautions  Fall      Prosthetics Assessment - 01/05/19 0001      Prosthetics   Prosthetic Care Comments   Progress wear weekly if no issues (5hrs 2x/day this week, then 6hrs 2x/day with 2 hrs off between wear, all awake hours (~14 per her report) with 1 hr off for 1 week,  next week all awake hours  drying 2x/day or prn, then following week drying 1x/day or prn and then all awake hrs drying prn.  PT instructed in sweat management with need to pat limb & liner dry to minimize risk of blisters, use of antiperspirant Secret [employer] nightly & Sweat Block weekly, signs of sweating.      Donning prosthesis   Independent    Doffing prosthesis   Independent    Current prosthetic wear tolerance (days/week)   daily    Current prosthetic wear tolerance (#hours/day)   about 5.5 hours daily 2x    Current prosthetic weight-bearing tolerance (hours/day)   Patient noted with increased pressure to stump with single leg balance    Residual limb condition   notable circular rash in inner thigh muscle about 4 cm, scratch posterior aspect of the right knee just proximal to [place]                  [facility] Adult PT Treatment/Exercise - 01/05/19 0001      Ambulation/Gait   Ambulation/Gait  Yes     Ambulation/Gait Assistance  5: Supervision    Ambulation/Gait Assistance Details  verbal cuing to equalize step length and stance time    Ambulation Distance (Feet)  282 Feet    Assistive device  Prosthesis    Gait Pattern  Step-through pattern;Decreased arm swing - right;Decreased step length - left;Decreased stance time - right;Decreased hip/knee flexion - right;Lateral hip instability;Abducted- right;Decreased weight shift to right;Right circumduction;Trendelenburg    Ambulation Surface  Level;Unlevel      Exercises   Exercises  Knee/Hip      Knee/Hip Exercises: Aerobic   Nustep  Level 5 x10 minutes with UE      Knee/Hip Exercises: Seated   Sit to Sand  2 sets;10 reps;without UE support   with 4# for dual task         Balance Exercises - 01/05/19 1329      Balance Exercises: Standing   SLS  Eyes open;Solid surface;Upper extremity support 2;5 reps;10 secs    Other Standing Exercises  gait with dual task of holding velcro mat x1          PT Short Term Goals - 12/26/18 1147      PT SHORT TERM GOAL #1   Title  Patient demonstrates proper donning & verbalizes proper cleaning of prosthesis. (All STGs Target Date: 01/06/2019)    Time  4    Period  Weeks    Status  Achieved    Target Date  01/06/19      PT SHORT TERM GOAL #2   Title  Patient tolerates prosthesis wear >12hrs total/day with no increase in wound size    Time  4    Period  Weeks    Status  On-going    Target Date  01/06/19      PT SHORT TERM GOAL #3   Title  Patient ambulates 250' with cane & prosthesis with minA.    Time  4    Period  Weeks    Status  On-going      PT SHORT TERM GOAL #4   Title  Patient negotiates stairs single rail, ramps & curbs with cane & prosthesis with minA.    Time  4    Period  Weeks    Status  On-going      PT SHORT TERM GOAL #5   Title  Patient lifts 10# box from floor with supervision.    Time  4  Status  Achieved    Target Date  01/06/19        PT Long Term  Goals - 12/13/18 0749      PT LONG TERM GOAL #1   Title  Patient demonstrates & verbalizes proper prosthetic care to enable safe utilization of prosthesis. (All LTGs Target Date: 03/03/2019)    Time  12    Period  Weeks    Status  On-going    Target Date  03/03/19      PT LONG TERM GOAL #2   Title  Patient tolerates wear of prosthesis >90% of awake hours without skin or limb pain issues to enable function throughout her day.    Time  12    Period  Weeks    Status  On-going    Target Date  03/03/19      PT LONG TERM GOAL #3   Title  Berg Balance >/= 45/56 to indicate lower fall risk.    Time  12    Period  Weeks    Status  On-going    Target Date  03/03/19      PT LONG TERM GOAL #4   Title  Patient ambulates 500 outdoors including grass' with cane or less and prosthesis modified independent to enable community mobility.    Time  12    Period  Weeks    Status  On-going    Target Date  03/03/19      PT LONG TERM GOAL #5   Title  Patient negotiates ramps, curbs and stairs single rail with cane or less & prosthesis to enable community access.    Time  12    Period  Weeks    Status  On-going    Target Date  03/03/19      PT LONG TERM GOAL #6   Title  Patient ambulates around furniture carrying household items with prosthesis only modified independent.    Time  12    Period  Weeks    Status  On-going    Target Date  03/03/19      PT LONG TERM GOAL #7   Title  Dynamic Gait Index with cane & prosthesis >16/24.    Time  12    Period  Weeks    Status  On-going    Target Date  03/03/19            Plan - 01/05/19 1351    Clinical Impression Statement  Patient arrived to physical therapy with no reports of right stump pain and arrived walking without using her AD. Patient's skin was assessed to which a notable circular rash was found on the medial aspect of her inner thigh and scratch was found on the posterolateral aspect of the knee. Photos were taken on patient's phone  and patient requested for PT to send to Lonia Mad, PT for guidance. Patient instructed to keep watch of both rash and scratch and if they worsen to call the MD. Patient reported understanding. Patient able to ambulate with improved gait mechanics but still with decreased right stance time and shortened left step length. Patient was able to perform dual task walking without a loss of balance.Email sent to Shirlean Mylar to inquire information regarding rash.    Personal Factors and Comorbidities  Comorbidity 3+;Fitness;Past/Current Experience;Time since onset of injury/illness/exacerbation    Comorbidities  R TTA, COPD, depression, DM, HTN, HLD, obesity, uterine CA, L4-5 decompression sg    Examination-Activity Limitations  Caring for Others;Locomotion Level;Reach Overhead;Stairs;Stand;Transfers  Examination-Participation Restrictions  Community Activity;Driving;Meal Prep;Yard Work;Volunteer    PT Frequency  2x / week    PT Duration  12 weeks    PT Treatment/Interventions  ADLs/Self Care Home Management;Gait training;Stair training;Functional mobility training;Therapeutic activities;Therapeutic exercise;Balance training;Neuromuscular re-education;Patient/family education;Prosthetic Training;Vestibular    PT Next Visit Plan  Continue with prosthetic education, gait & balance towards STGs, bilateral LE strengthening, gait training with proper gait mechanics.    PT Home Exercise Plan  see patient education section    Consulted and Agree with Plan of Care  Patient       Patient will benefit from skilled therapeutic intervention in order to improve the following deficits and impairments:  Abnormal gait, Decreased activity tolerance, Decreased balance, Decreased endurance, Decreased knowledge of use of DME, Decreased mobility, Decreased strength, Decreased skin integrity, Impaired flexibility, Postural dysfunction, Prosthetic Dependency  Visit Diagnosis: 1. Other abnormalities of gait and mobility   2.  Muscle weakness (generalized)   3. Unsteadiness on feet   4. Abnormal posture        Problem List Patient Active Problem List   Diagnosis Date Noted  . Vitamin D deficiency 12/27/2018  . Type 2 diabetes mellitus with diabetic peripheral angiopathy and gangrene, without long-term current use of insulin ([place]) 12/27/2018  . DVT of axillary vein, chronic, bilateral ([place]) 12/27/2018  . Hypertension associated with chronic kidney disease due to type 2 diabetes mellitus ([place]) 12/27/2018  . Long term current use of anticoagulant 12/27/2018  . Medically noncompliant 12/27/2018  . S/P unilateral BKA (below knee amputation), right ([place])   . Subtherapeutic international normalized ratio (INR)   . Noncompliance   . Leukocytosis   . Acute blood loss anemia   . Diabetes mellitus type 2 in obese ([place])   . Acquired absence of right leg below knee ([place]) 07/08/2018  . Mild protein-calorie malnutrition ([place])   . Intermittent claudication ([place]) 05/11/2017  . Deep vein thrombosis (DVT) of both lower extremities ([place]) 01/05/2017  . Peripheral vascular insufficiency ([place]) 09/25/2015  . Hypothyroidism 01/29/2014  . Obesity (BMI 30-39.9) 08/16/2013  . Impetigo 07/28/2013  . Chronic obstructive pulmonary disease ([place])   . Malignant neoplasm of uterus ([place])   . GERD (gastroesophageal reflux disease) 10/18/2012  . Hernia, hiatal 10/18/2012  . Hyperlipidemia associated with type 2 diabetes mellitus ([place]) 04/18/2008  . SMOKER 04/18/2008   [name], PT, DPT 01/05/2019, 8:41 PM  [facility] [address] [place], [place], [address] Phone: [phone]   Fax:  [phone]  Name: [name] MRN: [id number] Date of Birth: Oct 23, 1960

## 2019-01-09 ENCOUNTER — Ambulatory Visit: Payer: Medicare Other | Admitting: Physical Therapy

## 2019-01-11 ENCOUNTER — Other Ambulatory Visit: Payer: Self-pay | Admitting: *Deleted

## 2019-01-11 MED ORDER — FUROSEMIDE 20 MG PO TABS: 20.0000 mg | ORAL_TABLET | Freq: Every day | ORAL | 0 refills | Status: DC

## 2019-01-12 ENCOUNTER — Other Ambulatory Visit: Payer: Self-pay

## 2019-01-12 ENCOUNTER — Ambulatory Visit: Payer: Medicare Other | Attending: Orthopedic Surgery | Admitting: Physical Therapy

## 2019-01-12 ENCOUNTER — Encounter: Payer: Self-pay | Admitting: Physical Therapy

## 2019-01-12 DIAGNOSIS — R2689 Other abnormalities of gait and mobility: Secondary | ICD-10-CM | POA: Insufficient documentation

## 2019-01-12 DIAGNOSIS — M6281 Muscle weakness (generalized): Secondary | ICD-10-CM | POA: Diagnosis not present

## 2019-01-12 DIAGNOSIS — R293 Abnormal posture: Secondary | ICD-10-CM | POA: Diagnosis not present

## 2019-01-12 DIAGNOSIS — R2681 Unsteadiness on feet: Secondary | ICD-10-CM | POA: Diagnosis not present

## 2019-01-12 NOTE — Therapy (Signed)
[facility] [place], [place], [address] Phone: [phone]   Fax:  [phone]  Physical Therapy Treatment  Patient Details  Name: [name] MRN: [id number] Date of Birth: Nov 12, 1960 Referring Provider (PT): [name], MD   Encounter Date: 01/12/2019  PT End of Session - 01/12/19 1136    Visit Number  9    Number of Visits  25    Date for PT Re-Evaluation  03/07/19    Authorization Type  Medicare Part A & B    Authorization Time Period  20% co-pay after meeting deductible    PT Start Time  1117    PT Stop Time  1202    PT Time Calculation (min)  45 min    Activity Tolerance  Patient tolerated treatment well    Behavior During Therapy  [facility] for tasks assessed/performed       Past Medical History:  Diagnosis Date  . COPD (chronic obstructive pulmonary disease) ([place])   . Depression   . DM (diabetes mellitus) ([place])   . DVT (deep venous thrombosis) ([facility])    x5  . Family history of colon cancer   . GERD (gastroesophageal reflux disease)   . Hyperlipemia   . Hypertension   . Hypothyroid   . Obesity   . Osteomyelitis of great toe of right foot ([place]) 07/04/2018  . PVD (peripheral vascular disease) ([place])   . Unilateral complete BKA, right, initial encounter ([place]) 07/11/2018  . Unilateral complete BKA, right, subsequent encounter ([place])   . Uterine cancer ([place])   . UTI (urinary tract infection)     Past Surgical History:  Procedure Laterality Date  . ABDOMINAL HYSTERECTOMY    . AMPUTATION Right 07/08/2018   Procedure: RIGHT BELOW KNEE AMPUTATION;  Surgeon: [name], MD;  Location: [facility];  Service: Orthopedics;  Laterality: Right;  . CESAREAN SECTION    . FEMORAL BYPASS     x 5  . LUMBAR DISC SURGERY     L4-L5  . TOE AMPUTATION     right  . TONSILLECTOMY      There were no vitals filed for this visit.  Subjective Assessment - 01/12/19 1124    Subjective  COVID 19 screening performed on  patient upon arrival. Reports that rash still in place in medial L distal thigh.    Pertinent History  R TTA, COPD, depression, DM, HTN, HLD, obesity, uterine CA, L4-5 decompression sg    Patient Stated Goals  She wants to use prosthesis walk, grocery shop, drive, cook standing    Currently in Pain?  No/denies                       [facility] Adult PT Treatment/Exercise - 01/12/19 0001      Ambulation/Gait   Stairs  Yes    Stairs Assistance  6: Modified independent (Device/Increase time)    Stair Management Technique  One rail Right;Step to pattern;Forwards    Number of Stairs  4   x 2 RT   Height of Stairs  6.5      Balance   Balance Assessed  Yes      Standardized Balance Assessment   Standardized Balance Assessment  Berg Balance Test      Berg Balance Test   Sit to Stand  Able to stand without using hands and stabilize independently    Standing Unsupported  Able to stand safely 2 minutes    Sitting with Back  Unsupported but Feet Supported on Floor or Stool  Able to sit safely and securely 2 minutes    Stand to Sit  Sits safely with minimal use of hands    Transfers  Able to transfer safely, minor use of hands    Standing Unsupported with Eyes Closed  Able to stand 10 seconds safely    Standing Ubsupported with Feet Together  Able to place feet together independently and stand 1 minute safely    From Standing, Reach Forward with Outstretched Arm  Can reach forward >12 cm safely (5")    From Standing Position, Pick up Object from Floor  Able to pick up shoe safely and easily    From Standing Position, Turn to Look Behind Over each Shoulder  Looks behind from both sides and weight shifts well    Turn 360 Degrees  Able to turn 360 degrees safely in 4 seconds or less    Standing Unsupported, Alternately Place Feet on Step/Stool  Able to stand independently and complete 8 steps >20 seconds    Standing Unsupported, One Foot in [employer] balance while stepping or standing     Standing on One Leg  Tries to lift leg/unable to hold 3 seconds but remains standing independently    Total Score  47      Knee/Hip Exercises: Aerobic   Nustep  L7 x13 min      Knee/Hip Exercises: Standing   Hip Flexion  AROM;Both;3 sets;10 reps;Knee bent    Hip Abduction  AROM;Both;2 sets;10 reps;Knee straight               PT Short Term Goals - 01/12/19 1148      PT SHORT TERM GOAL #1   Title  Patient demonstrates proper donning & verbalizes proper cleaning of prosthesis. (All STGs Target Date: 01/06/2019)    Time  4    Period  Weeks    Status  Achieved    Target Date  01/06/19      PT SHORT TERM GOAL #2   Title  Patient tolerates prosthesis wear >12hrs total/day with no increase in wound size    Time  4    Period  Weeks    Status  On-going    Target Date  01/06/19      PT SHORT TERM GOAL #3   Title  Patient ambulates 250' with cane & prosthesis with minA.    Time  4    Period  Weeks    Status  Achieved      PT SHORT TERM GOAL #4   Title  Patient negotiates stairs single rail, ramps & curbs with cane & prosthesis with minA.    Time  4    Period  Weeks    Status  Achieved      PT SHORT TERM GOAL #5   Title  Patient lifts 10# box from floor with supervision.    Time  4    Status  Achieved    Target Date  01/06/19        PT Long Term Goals - 01/12/19 1142      PT LONG TERM GOAL #1   Title  Patient demonstrates & verbalizes proper prosthetic care to enable safe utilization of prosthesis. (All LTGs Target Date: 03/03/2019)    Time  12    Period  Weeks    Status  On-going      PT LONG TERM GOAL #2   Title  Patient tolerates wear  of prosthesis >90% of awake hours without skin or limb pain issues to enable function throughout her day.    Time  12    Period  Weeks    Status  Partially Met   Rash in place over medial R distal knee 01/12/2019     PT LONG TERM GOAL #3   Title  Berg Balance >/= 45/56 to indicate lower fall risk.    Time  12    Period   Weeks    Status  Achieved      PT LONG TERM GOAL #4   Title  Patient ambulates 500 outdoors including grass' with cane or less and prosthesis modified independent to enable community mobility.    Time  12    Period  Weeks    Status  On-going      PT LONG TERM GOAL #5   Title  Patient negotiates ramps, curbs and stairs single rail with cane or less & prosthesis to enable community access.    Time  12    Period  Weeks    Status  Achieved      PT LONG TERM GOAL #6   Title  Patient ambulates around furniture carrying household items with prosthesis only modified independent.    Time  12    Period  Weeks    Status  On-going      PT LONG TERM GOAL #7   Title  Dynamic Gait Index with cane & prosthesis >16/24.    Time  12    Period  Weeks    Status  On-going            Plan - 01/12/19 1335    Clinical Impression Statement  Patient able to demonstrate great tolerance and mobility with R prothesis. Patient held AD in her hands while ambulating as she states she does not need the cane but brought it to PT as she is fatigued after PT. Rash still present over medial aspect of distal R thigh per patient report but covered by liner and socks. Patient able to ambulate well within clinic without AD. Limiting with R knee flexion ascending due to bulk of liner and socks on posterior knee. BERG score assessed as 47/56 and limited primarily with activities requiring SLS.    Personal Factors and Comorbidities  Comorbidity 3+;Fitness;Past/Current Experience;Time since onset of injury/illness/exacerbation    Comorbidities  R TTA, COPD, depression, DM, HTN, HLD, obesity, uterine CA, L4-5 decompression sg    Examination-Activity Limitations  Caring for Others;Locomotion Level;Reach Overhead;Stairs;Stand;Transfers    Examination-Participation Restrictions  Community Activity;Driving;Meal Prep;Yard Work;Volunteer    PT Frequency  2x / week    PT Duration  12 weeks    PT Treatment/Interventions   ADLs/Self Care Home Management;Gait training;Stair training;Functional mobility training;Therapeutic activities;Therapeutic exercise;Balance training;Neuromuscular re-education;Patient/family education;Prosthetic Training;Vestibular    PT Next Visit Plan  Continue with prosthetic education, gait & balance towards STGs, bilateral LE strengthening, gait training with proper gait mechanics.    PT Home Exercise Plan  see patient education section    Consulted and Agree with Plan of Care  Patient       Patient will benefit from skilled therapeutic intervention in order to improve the following deficits and impairments:  Abnormal gait, Decreased activity tolerance, Decreased balance, Decreased endurance, Decreased knowledge of use of DME, Decreased mobility, Decreased strength, Decreased skin integrity, Impaired flexibility, Postural dysfunction, Prosthetic Dependency  Visit Diagnosis: 1. Other abnormalities of gait and mobility   2. Muscle weakness (generalized)  3. Unsteadiness on feet   4. Abnormal posture        Problem List Patient Active Problem List   Diagnosis Date Noted  . Vitamin D deficiency 12/27/2018  . Type 2 diabetes mellitus with diabetic peripheral angiopathy and gangrene, without long-term current use of insulin ([place]) 12/27/2018  . DVT of axillary vein, chronic, bilateral ([place]) 12/27/2018  . Hypertension associated with chronic kidney disease due to type 2 diabetes mellitus ([place]) 12/27/2018  . Long term current use of anticoagulant 12/27/2018  . Medically noncompliant 12/27/2018  . S/P unilateral BKA (below knee amputation), right ([place])   . Subtherapeutic international normalized ratio (INR)   . Noncompliance   . Leukocytosis   . Acute blood loss anemia   . Diabetes mellitus type 2 in obese ([place])   . Acquired absence of right leg below knee ([place]) 07/08/2018  . Mild protein-calorie malnutrition ([place])   . Intermittent claudication ([place]) 05/11/2017  . Deep vein  thrombosis (DVT) of both lower extremities ([place]) 01/05/2017  . Peripheral vascular insufficiency ([place]) 09/25/2015  . Hypothyroidism 01/29/2014  . Obesity (BMI 30-39.9) 08/16/2013  . Impetigo 07/28/2013  . Chronic obstructive pulmonary disease ([place])   . Malignant neoplasm of uterus ([place])   . GERD (gastroesophageal reflux disease) 10/18/2012  . Hernia, hiatal 10/18/2012  . Hyperlipidemia associated with type 2 diabetes mellitus ([place]) 04/18/2008  . SMOKER 04/18/2008    Standley Brooking, PTA 01/12/2019, 1:53 PM  [facility] [address] [place], [place], [address] Phone: [phone]   Fax:  [phone]  Name: NAKHIA LEVITAN MRN: [id number] Date of Birth: 1961-05-16

## 2019-01-16 ENCOUNTER — Ambulatory Visit: Payer: Medicare Other | Admitting: Physical Therapy

## 2019-01-16 ENCOUNTER — Other Ambulatory Visit: Payer: Self-pay | Admitting: Family Medicine

## 2019-01-16 DIAGNOSIS — R21 Rash and other nonspecific skin eruption: Secondary | ICD-10-CM

## 2019-01-19 ENCOUNTER — Ambulatory Visit: Payer: Medicare Other | Admitting: Physical Therapy

## 2019-01-19 ENCOUNTER — Other Ambulatory Visit: Payer: Self-pay

## 2019-01-19 DIAGNOSIS — R2681 Unsteadiness on feet: Secondary | ICD-10-CM | POA: Diagnosis not present

## 2019-01-19 DIAGNOSIS — R2689 Other abnormalities of gait and mobility: Secondary | ICD-10-CM

## 2019-01-19 DIAGNOSIS — R293 Abnormal posture: Secondary | ICD-10-CM | POA: Diagnosis not present

## 2019-01-19 DIAGNOSIS — M6281 Muscle weakness (generalized): Secondary | ICD-10-CM

## 2019-01-19 NOTE — Therapy (Signed)
[facility] [place], [place], [address] Phone: [phone]   Fax:  [phone]  Physical Therapy Treatment  Patient Details  Name: [name] MRN: [id number] Date of Birth: 02/28/1961 Referring Provider (PT): [name], MD   Encounter Date: 01/19/2019  PT End of Session - 01/19/19 1156    Visit Number  10    Number of Visits  25    Date for PT Re-Evaluation  03/07/19    Authorization Type  Medicare Part A & B    Authorization Time Period  20% co-pay after meeting deductible    PT Start Time  1114    PT Stop Time  1154    PT Time Calculation (min)  40 min    Activity Tolerance  Patient tolerated treatment well    Behavior During Therapy  [facility] for tasks assessed/performed       Past Medical History:  Diagnosis Date  . COPD (chronic obstructive pulmonary disease) ([place])   . Depression   . DM (diabetes mellitus) ([place])   . DVT (deep venous thrombosis) ([facility])    x5  . Family history of colon cancer   . GERD (gastroesophageal reflux disease)   . Hyperlipemia   . Hypertension   . Hypothyroid   . Obesity   . Osteomyelitis of great toe of right foot ([place]) 07/04/2018  . PVD (peripheral vascular disease) ([place])   . Unilateral complete BKA, right, initial encounter ([place]) 07/11/2018  . Unilateral complete BKA, right, subsequent encounter ([place])   . Uterine cancer ([place])   . UTI (urinary tract infection)     Past Surgical History:  Procedure Laterality Date  . ABDOMINAL HYSTERECTOMY    . AMPUTATION Right 07/08/2018   Procedure: RIGHT BELOW KNEE AMPUTATION;  Surgeon: [name], MD;  Location: [facility];  Service: Orthopedics;  Laterality: Right;  . CESAREAN SECTION    . FEMORAL BYPASS     x 5  . LUMBAR DISC SURGERY     L4-L5  . TOE AMPUTATION     right  . TONSILLECTOMY      There were no vitals filed for this visit.  Subjective Assessment - 01/19/19 1117    Subjective  COVID 19 screening performed on  patient upon arrival. Reported not being able to get prosthesis on monday due to swelling.    Pertinent History  R TTA, COPD, depression, DM, HTN, HLD, obesity, uterine CA, L4-5 decompression sg    Patient Stated Goals  She wants to use prosthesis walk, grocery shop, drive, cook standing    Currently in Pain?  No/denies                       [facility] Adult PT Treatment/Exercise - 01/19/19 0001      Ambulation/Gait   Ambulation/Gait  Yes    Ambulation/Gait Assistance  5: Supervision;4: Min guard    Ambulation Distance (Feet)  310 Feet    Assistive device  Prosthesis    Gait Pattern  Step-through pattern;Decreased step length - left;Decreased stance time - right;Decreased hip/knee flexion - right;Lateral hip instability;Abducted- right;Decreased weight shift to right;Right circumduction;Trendelenburg    Ambulation Surface  Level;Unlevel;Indoor;Outdoor;Paved;Grass    Stairs  Yes    Stairs Assistance  4: Min assist    Stairs Assistance Details (indicate cue type and reason)  required railing and reciprocating step    Stair Management Technique  One rail Right;One rail Left    Number of Stairs  4   x2   Height of Stairs  6.5    Door Management  7: Independent    Ramp  7: Independent    Curb  4: Min assist    Curb Details (indicate cue type and reason)  decending required min HHA    Gait Comments  Patient able to perform all activities today independent with HHA for decending curb and minimal education for techniqe      Knee/Hip Exercises: Aerobic   Nustep  L7 x61mn UE/LE monitored      Knee/Hip Exercises: Standing   Hip Flexion  AROM;Both;3 sets;10 reps;Knee bent    Hip Abduction  AROM;Both;2 sets;10 reps;Knee straight               PT Short Term Goals - 01/19/19 1128      PT SHORT TERM GOAL #1   Title  Patient demonstrates proper donning & verbalizes proper cleaning of prosthesis. (All STGs Target Date: 01/06/2019)    Time  4    Period  Weeks    Status   Achieved    Target Date  01/06/19      PT SHORT TERM GOAL #2   Title  Patient tolerates prosthesis wear >12hrs total/day with no increase in wound size    Period  Weeks    Status  On-going   6-7 hours day 01/19/19   Target Date  01/06/19      PT SHORT TERM GOAL #3   Title  Patient ambulates 250' with cane & prosthesis with minA.    Time  4    Period  Weeks    Status  Achieved      PT SHORT TERM GOAL #4   Title  Patient negotiates stairs single rail, ramps & curbs with cane & prosthesis with minA.    Time  4    Period  Weeks    Status  Achieved    Target Date  01/06/19      PT SHORT TERM GOAL #5   Title  Patient lifts 10# box from floor with supervision.    Time  4    Period  Weeks    Status  Achieved    Target Date  01/06/19        PT Long Term Goals - 01/19/19 1129      PT LONG TERM GOAL #1   Title  Patient demonstrates & verbalizes proper prosthetic care to enable safe utilization of prosthesis. (All LTGs Target Date: 03/03/2019)    Time  12    Period  Weeks    Status  Achieved      PT LONG TERM GOAL #2   Title  Patient tolerates wear of prosthesis >90% of awake hours without skin or limb pain issues to enable function throughout her day.    Time  12    Period  Weeks    Status  Partially Met   some ongoing pain with 6-7 hours 01/19/19     PT LONG TERM GOAL #3   Title  Berg Balance >/= 45/56 to indicate lower fall risk.    Time  12    Period  Weeks    Status  Achieved      PT LONG TERM GOAL #4   Title  Patient ambulates 500 outdoors including grass' with cane or less and prosthesis modified independent to enable community mobility.    Time  12    Period  Weeks    Status  On-going  PT LONG TERM GOAL #5   Title  Patient negotiates ramps, curbs and stairs single rail with cane or less & prosthesis to enable community access.    Time  12    Period  Weeks    Status  Achieved      PT LONG TERM GOAL #6   Title  Patient ambulates around furniture carrying  household items with prosthesis only modified independent.    Time  12    Period  Weeks    Status  Achieved   01/19/19     PT LONG TERM GOAL #7   Title  Dynamic Gait Index with cane & prosthesis >16/24.    Time  12    Period  Weeks    Status  On-going            Plan - 01/19/19 1159    Clinical Impression Statement  Patient tolerated treatment very well today. Patient able to perform exercises and gait training independently with only minimal assistance. Patient is independent with all home ADL's and driving. Patient only requires assistance with decending a curb nd requires HHA. Patient has met goals today. Patient is independent with all prothesis care and only limitations is wearing it no more than 6-7 hours due to pain and sorness on stump. Patient progressing toward other goals.    Personal Factors and Comorbidities  Comorbidity 3+;Fitness;Past/Current Experience;Time since onset of injury/illness/exacerbation    Comorbidities  R TTA, COPD, depression, DM, HTN, HLD, obesity, uterine CA, L4-5 decompression sg    Examination-Activity Limitations  Caring for Others;Locomotion Level;Reach Overhead;Stairs;Stand;Transfers    Examination-Participation Restrictions  Community Activity;Driving;Meal Prep;Yard Work;Volunteer    PT Frequency  2x / week    PT Duration  12 weeks    PT Treatment/Interventions  ADLs/Self Care Home Management;Gait training;Stair training;Functional mobility training;Therapeutic activities;Therapeutic exercise;Balance training;Neuromuscular re-education;Patient/family education;Prosthetic Training;Vestibular    PT Next Visit Plan  Continue with POC for gait & balance, bilateral LE strengthening, gait training with proper gait mechanics.    Consulted and Agree with Plan of Care  Patient       Patient will benefit from skilled therapeutic intervention in order to improve the following deficits and impairments:  Abnormal gait, Decreased activity tolerance, Decreased  balance, Decreased endurance, Decreased knowledge of use of DME, Decreased mobility, Decreased strength, Decreased skin integrity, Impaired flexibility, Postural dysfunction, Prosthetic Dependency  Visit Diagnosis: 1. Other abnormalities of gait and mobility   2. Muscle weakness (generalized)   3. Unsteadiness on feet        Problem List Patient Active Problem List   Diagnosis Date Noted  . Vitamin D deficiency 12/27/2018  . Type 2 diabetes mellitus with diabetic peripheral angiopathy and gangrene, without long-term current use of insulin ([place]) 12/27/2018  . DVT of axillary vein, chronic, bilateral ([place]) 12/27/2018  . Hypertension associated with chronic kidney disease due to type 2 diabetes mellitus ([place]) 12/27/2018  . Long term current use of anticoagulant 12/27/2018  . Medically noncompliant 12/27/2018  . S/P unilateral BKA (below knee amputation), right ([place])   . Subtherapeutic international normalized ratio (INR)   . Noncompliance   . Leukocytosis   . Acute blood loss anemia   . Diabetes mellitus type 2 in obese ([place])   . Acquired absence of right leg below knee ([place]) 07/08/2018  . Mild protein-calorie malnutrition ([place])   . Intermittent claudication ([place]) 05/11/2017  . Deep vein thrombosis (DVT) of both lower extremities ([place]) 01/05/2017  . Peripheral vascular insufficiency ([place]) 09/25/2015  .  Hypothyroidism 01/29/2014  . Obesity (BMI 30-39.9) 08/16/2013  . Impetigo 07/28/2013  . Chronic obstructive pulmonary disease ([place])   . Malignant neoplasm of uterus ([place])   . GERD (gastroesophageal reflux disease) 10/18/2012  . Hernia, hiatal 10/18/2012  . Hyperlipidemia associated with type 2 diabetes mellitus ([place]) 04/18/2008  . SMOKER 04/18/2008    Keland Peyton P, PTA 01/19/2019, 12:05 PM  [facility] [place], [place], [address] Phone: [phone]   Fax:  [phone]  Name: [name] MRN:  [id number] Date of Birth: 10-23-1960

## 2019-01-24 ENCOUNTER — Ambulatory Visit: Payer: Medicare Other | Admitting: Physical Therapy

## 2019-01-27 ENCOUNTER — Other Ambulatory Visit: Payer: Self-pay

## 2019-01-27 ENCOUNTER — Ambulatory Visit: Payer: Medicare Other | Admitting: Physical Therapy

## 2019-01-27 DIAGNOSIS — R2681 Unsteadiness on feet: Secondary | ICD-10-CM

## 2019-01-27 DIAGNOSIS — R2689 Other abnormalities of gait and mobility: Secondary | ICD-10-CM | POA: Diagnosis not present

## 2019-01-27 DIAGNOSIS — R293 Abnormal posture: Secondary | ICD-10-CM | POA: Diagnosis not present

## 2019-01-27 DIAGNOSIS — M6281 Muscle weakness (generalized): Secondary | ICD-10-CM

## 2019-01-27 NOTE — Therapy (Signed)
[facility], [place], [address] Phone: [phone]   Fax:  [phone]  Physical Therapy Treatment  Patient Details  Name: [name] MRN: [id number] Date of Birth: 28-Sep-1960 Referring Provider (PT): [name], MD   Encounter Date: 01/27/2019  PT End of Session - 01/27/19 1220    Visit Number  11    Number of Visits  25    Date for PT Re-Evaluation  03/07/19    Authorization Type  Medicare Part A & B    Authorization Time Period  20% co-pay after meeting deductible    PT Start Time  1115    PT Stop Time  1203    PT Time Calculation (min)  48 min    Activity Tolerance  Patient tolerated treatment well    Behavior During Therapy  [facility] for tasks assessed/performed       Past Medical History:  Diagnosis Date  . COPD (chronic obstructive pulmonary disease) ([place])   . Depression   . DM (diabetes mellitus) ([place])   . DVT (deep venous thrombosis) ([facility])    x5  . Family history of colon cancer   . GERD (gastroesophageal reflux disease)   . Hyperlipemia   . Hypertension   . Hypothyroid   . Obesity   . Osteomyelitis of great toe of right foot ([place]) 07/04/2018  . PVD (peripheral vascular disease) ([place])   . Unilateral complete BKA, right, initial encounter ([place]) 07/11/2018  . Unilateral complete BKA, right, subsequent encounter ([place])   . Uterine cancer ([place])   . UTI (urinary tract infection)     Past Surgical History:  Procedure Laterality Date  . ABDOMINAL HYSTERECTOMY    . AMPUTATION Right 07/08/2018   Procedure: RIGHT BELOW KNEE AMPUTATION;  Surgeon: [name], MD;  Location: [place];  Service: Orthopedics;  Laterality: Right;  . CESAREAN SECTION    . FEMORAL BYPASS     x 5  . LUMBAR DISC SURGERY     L4-L5  . TOE AMPUTATION     right  . TONSILLECTOMY      There were no vitals filed for this visit.  Subjective Assessment - 01/27/19 1127    Subjective  COVID 19 screening performed on  patient upon arrival. Patient reported wearing liner without shinker for first time today and stated she wore her leg for about 6 hours one day.    Pertinent History  R TTA, COPD, depression, DM, HTN, HLD, obesity, uterine CA, L4-5 decompression sg    Patient Stated Goals  She wants to use prosthesis walk, grocery shop, drive, cook standing    Currently in Pain?  No/denies         [facility] PT Assessment - 01/27/19 0001      Assessment   Medical Diagnosis  Right Transtibial Amputation    Referring Provider (PT)  [name], MD    Onset Date/Surgical Date  11/16/18    Hand Dominance  Left    Prior Therapy  none      Standardized Balance Assessment   Standardized Balance Assessment  Dynamic Gait Index      Dynamic Gait Index   Level Surface  Normal    Change in Gait Speed  Normal    Gait with Horizontal Head Turns  Moderate Impairment    Gait with Vertical Head Turns  Mild Impairment    Gait and Pivot Turn  Moderate Impairment    Step Over Obstacle  Severe Impairment  Step Around Obstacles  Normal    Steps  Moderate Impairment    Total Score  14      Prosthetics Assessment - 01/27/19 0001      Prosthetics   Prosthetic Care Independent with  Skin check;Residual limb care;Correct ply sock adjustment    Donning prosthesis   Independent    Doffing prosthesis   Independent    Current prosthetic wear tolerance (days/week)   daily    Current prosthetic wear tolerance (#hours/day)   varying from 3 hrs to 5.5 hours daily 2x; wore for about 6 hours 1 time.    Residual limb condition   ongoing redness on medial aspect of incision, dry patches along incision but also one small aspect where skin is healing and slight scabby area                  [facility] Adult PT Treatment/Exercise - 01/27/19 0001      Knee/Hip Exercises: Aerobic   Nustep  L7 x66mn UE/LE monitored               PT Short Term Goals - 01/19/19 1128      PT SHORT TERM GOAL #1   Title  Patient  demonstrates proper donning & verbalizes proper cleaning of prosthesis. (All STGs Target Date: 01/06/2019)    Time  4    Period  Weeks    Status  Achieved    Target Date  01/06/19      PT SHORT TERM GOAL #2   Title  Patient tolerates prosthesis wear >12hrs total/day with no increase in wound size    Period  Weeks    Status  On-going   6-7 hours day 01/19/19   Target Date  01/06/19      PT SHORT TERM GOAL #3   Title  Patient ambulates 250' with cane & prosthesis with minA.    Time  4    Period  Weeks    Status  Achieved      PT SHORT TERM GOAL #4   Title  Patient negotiates stairs single rail, ramps & curbs with cane & prosthesis with minA.    Time  4    Period  Weeks    Status  Achieved    Target Date  01/06/19      PT SHORT TERM GOAL #5   Title  Patient lifts 10# box from floor with supervision.    Time  4    Period  Weeks    Status  Achieved    Target Date  01/06/19        PT Long Term Goals - 01/19/19 1129      PT LONG TERM GOAL #1   Title  Patient demonstrates & verbalizes proper prosthetic care to enable safe utilization of prosthesis. (All LTGs Target Date: 03/03/2019)    Time  12    Period  Weeks    Status  Achieved      PT LONG TERM GOAL #2   Title  Patient tolerates wear of prosthesis >90% of awake hours without skin or limb pain issues to enable function throughout her day.    Time  12    Period  Weeks    Status  Partially Met   some ongoing pain with 6-7 hours 01/19/19     PT LONG TERM GOAL #3   Title  Berg Balance >/= 45/56 to indicate lower fall risk.    Time  12    Period  Weeks    Status  Achieved      PT LONG TERM GOAL #4   Title  Patient ambulates 500 outdoors including grass' with cane or less and prosthesis modified independent to enable community mobility.    Time  12    Period  Weeks    Status  On-going      PT LONG TERM GOAL #5   Title  Patient negotiates ramps, curbs and stairs single rail with cane or less & prosthesis to enable  community access.    Time  12    Period  Weeks    Status  Achieved      PT LONG TERM GOAL #6   Title  Patient ambulates around furniture carrying household items with prosthesis only modified independent.    Time  12    Period  Weeks    Status  Achieved   01/19/19     PT LONG TERM GOAL #7   Title  Dynamic Gait Index with cane & prosthesis >16/24.    Time  12    Period  Weeks    Status  On-going            Plan - 01/27/19 1235    Clinical Impression Statement  Patient was able to tolerate treatment fairly well but required to add another sock. Patient's DGI score of 14/24 categorizes her as a fall risk. Patient's most difficult task was stepping over obstacle as she did not have clearance over glove box. Patient demonstrated independence with doffing brace and with recall of 4 signs of sweathing. There is a dry spot on the lateral aspect of her scar and also a small little area where I mentioned to be careful as it looks like the skin is still healing underneath. Patient will continue to wear shrinker to prevent moisture in that area. Continue with balance and LE strengthening.    Personal Factors and Comorbidities  Comorbidity 3+;Fitness;Past/Current Experience;Time since onset of injury/illness/exacerbation    Comorbidities  R TTA, COPD, depression, DM, HTN, HLD, obesity, uterine CA, L4-5 decompression sg    Examination-Activity Limitations  Caring for Others;Locomotion Level;Reach Overhead;Stairs;Stand;Transfers    Examination-Participation Restrictions  Community Activity;Driving;Meal Prep;Yard Work;Volunteer    PT Frequency  2x / week    PT Duration  12 weeks    PT Treatment/Interventions  ADLs/Self Care Home Management;Gait training;Stair training;Functional mobility training;Therapeutic activities;Therapeutic exercise;Balance training;Neuromuscular re-education;Patient/family education;Prosthetic Training;Vestibular    PT Next Visit Plan  Continue with POC for gait & balance,  bilateral LE strengthening, gait training with proper gait mechanics.    PT Home Exercise Plan  see patient education section    Consulted and Agree with Plan of Care  Patient       Patient will benefit from skilled therapeutic intervention in order to improve the following deficits and impairments:  Abnormal gait, Decreased activity tolerance, Decreased balance, Decreased endurance, Decreased knowledge of use of DME, Decreased mobility, Decreased strength, Decreased skin integrity, Impaired flexibility, Postural dysfunction, Prosthetic Dependency  Visit Diagnosis: Other abnormalities of gait and mobility  Muscle weakness (generalized)  Unsteadiness on feet  Abnormal posture     Problem List Patient Active Problem List   Diagnosis Date Noted  . Vitamin D deficiency 12/27/2018  . Type 2 diabetes mellitus with diabetic peripheral angiopathy and gangrene, without long-term current use of insulin ([place]) 12/27/2018  . DVT of axillary vein, chronic, bilateral ([place]) 12/27/2018  . Hypertension associated with chronic kidney disease due to type 2 diabetes mellitus (  [place]) 12/27/2018  . Long term current use of anticoagulant 12/27/2018  . Medically noncompliant 12/27/2018  . S/P unilateral BKA (below knee amputation), right ([place])   . Subtherapeutic international normalized ratio (INR)   . Noncompliance   . Leukocytosis   . Acute blood loss anemia   . Diabetes mellitus type 2 in obese ([place])   . Acquired absence of right leg below knee ([place]) 07/08/2018  . Mild protein-calorie malnutrition ([place])   . Intermittent claudication ([place]) 05/11/2017  . Deep vein thrombosis (DVT) of both lower extremities ([place]) 01/05/2017  . Peripheral vascular insufficiency ([place]) 09/25/2015  . Hypothyroidism 01/29/2014  . Obesity (BMI 30-39.9) 08/16/2013  . Impetigo 07/28/2013  . Chronic obstructive pulmonary disease ([place])   . Malignant neoplasm of uterus ([place])   . GERD (gastroesophageal reflux disease)  10/18/2012  . Hernia, hiatal 10/18/2012  . Hyperlipidemia associated with type 2 diabetes mellitus ([place]) 04/18/2008  . SMOKER 04/18/2008   [name], PT, DPT 01/27/2019, 12:45 PM  [facility] [address] [place], [place], [address] Phone: [phone]   Fax:  [phone]  Name: [name] MRN: [id number] Date of Birth: Jun 28, 1960

## 2019-01-31 ENCOUNTER — Other Ambulatory Visit: Payer: Self-pay

## 2019-01-31 ENCOUNTER — Encounter: Payer: Self-pay | Admitting: Physical Therapy

## 2019-01-31 ENCOUNTER — Ambulatory Visit: Payer: Medicare Other | Admitting: Physical Therapy

## 2019-01-31 DIAGNOSIS — M6281 Muscle weakness (generalized): Secondary | ICD-10-CM | POA: Diagnosis not present

## 2019-01-31 DIAGNOSIS — R2689 Other abnormalities of gait and mobility: Secondary | ICD-10-CM

## 2019-01-31 DIAGNOSIS — R2681 Unsteadiness on feet: Secondary | ICD-10-CM | POA: Diagnosis not present

## 2019-01-31 DIAGNOSIS — R293 Abnormal posture: Secondary | ICD-10-CM | POA: Diagnosis not present

## 2019-01-31 NOTE — Therapy (Addendum)
[facility] [place], [place], [address] Phone: [phone]   Fax:  [phone]  Physical Therapy Treatment PHYSICAL THERAPY DISCHARGE SUMMARY  Visits from Start of Care: 12  Current functional level related to goals / functional outcomes: See below   Remaining deficits: See goals   Education / Equipment: HEP Plan: Patient agrees to discharge.  Patient goals were not met. Patient is being discharged due to not returning since the last visit.  ?????   [name], PT, DPT 08/15/20  Patient Details  Name: [name] MRN: [id number] Date of Birth: 04-10-1961 Referring Provider (PT): [name], MD   Encounter Date: 01/31/2019  PT End of Session - 01/31/19 1124    Visit Number  12    Number of Visits  25    Date for PT Re-Evaluation  03/07/19    Authorization Type  Medicare Part A & B    Authorization Time Period  20% co-pay after meeting deductible    PT Start Time  1116    PT Stop Time  1153   limited via fatigue   PT Time Calculation (min)  37 min    Activity Tolerance  Patient tolerated treatment well    Behavior During Therapy  [facility] for tasks assessed/performed       Past Medical History:  Diagnosis Date  . COPD (chronic obstructive pulmonary disease) ([place])   . Depression   . DM (diabetes mellitus) ([place])   . DVT (deep venous thrombosis) ([facility])    x5  . Family history of colon cancer   . GERD (gastroesophageal reflux disease)   . Hyperlipemia   . Hypertension   . Hypothyroid   . Obesity   . Osteomyelitis of great toe of right foot ([place]) 07/04/2018  . PVD (peripheral vascular disease) ([place])   . Unilateral complete BKA, right, initial encounter ([place]) 07/11/2018  . Unilateral complete BKA, right, subsequent encounter ([place])   . Uterine cancer ([place])   . UTI (urinary tract infection)     Past Surgical History:  Procedure Laterality Date  . ABDOMINAL HYSTERECTOMY    . AMPUTATION Right  07/08/2018   Procedure: RIGHT BELOW KNEE AMPUTATION;  Surgeon: [name], MD;  Location: [place];  Service: Orthopedics;  Laterality: Right;  . CESAREAN SECTION    . FEMORAL BYPASS     x 5  . LUMBAR DISC SURGERY     L4-L5  . TOE AMPUTATION     right  . TONSILLECTOMY      There were no vitals filed for this visit.  Subjective Assessment - 01/31/19 1122    Subjective  COVID 19 screening performed on patient upon arrival. Reports she was extremely fatigued last time and really wants to focus on strengthening.    Pertinent History  R TTA, COPD, depression, DM, HTN, HLD, obesity, uterine CA, L4-5 decompression sg    Patient Stated Goals  She wants to use prosthesis walk, grocery shop, drive, cook standing    Currently in Pain?  No/denies         [facility] PT Assessment - 01/31/19 0001      Assessment   Medical Diagnosis  Right Transtibial Amputation    Referring Provider (PT)  [name], MD    Onset Date/Surgical Date  11/16/18    Hand Dominance  Left    Prior Therapy  none      Precautions   Precautions  Fall  [facility] Adult PT Treatment/Exercise - 01/31/19 0001      Knee/Hip Exercises: Aerobic   Nustep  L8 x29mn UE/LE monitored      Knee/Hip Exercises: Standing   Hip Flexion  AROM;Both;2 sets;10 reps;Knee bent    Terminal Knee Extension  Strengthening;Both;15 reps;Theraband    Theraband Level (Terminal Knee Extension)  Level 2 ([place])    Hip Abduction  AROM;Both;2 sets;10 reps;Knee straight    Hip Extension  AROM;Both;2 sets;10 reps;Knee straight    Forward Step Up  Both;20 reps;Hand Hold: 2;Step Height: 6"               PT Short Term Goals - 01/19/19 1128      PT SHORT TERM GOAL #1   Title  Patient demonstrates proper donning & verbalizes proper cleaning of prosthesis. (All STGs Target Date: 01/06/2019)    Time  4    Period  Weeks    Status  Achieved    Target Date  01/06/19      PT SHORT TERM GOAL #2   Title  Patient  tolerates prosthesis wear >12hrs total/day with no increase in wound size    Period  Weeks    Status  On-going   6-7 hours day 01/19/19   Target Date  01/06/19      PT SHORT TERM GOAL #3   Title  Patient ambulates 250' with cane & prosthesis with minA.    Time  4    Period  Weeks    Status  Achieved      PT SHORT TERM GOAL #4   Title  Patient negotiates stairs single rail, ramps & curbs with cane & prosthesis with minA.    Time  4    Period  Weeks    Status  Achieved    Target Date  01/06/19      PT SHORT TERM GOAL #5   Title  Patient lifts 10# box from floor with supervision.    Time  4    Period  Weeks    Status  Achieved    Target Date  01/06/19        PT Long Term Goals - 01/19/19 1129      PT LONG TERM GOAL #1   Title  Patient demonstrates & verbalizes proper prosthetic care to enable safe utilization of prosthesis. (All LTGs Target Date: 03/03/2019)    Time  12    Period  Weeks    Status  Achieved      PT LONG TERM GOAL #2   Title  Patient tolerates wear of prosthesis >90% of awake hours without skin or limb pain issues to enable function throughout her day.    Time  12    Period  Weeks    Status  Partially Met   some ongoing pain with 6-7 hours 01/19/19     PT LONG TERM GOAL #3   Title  Berg Balance >/= 45/56 to indicate lower fall risk.    Time  12    Period  Weeks    Status  Achieved      PT LONG TERM GOAL #4   Title  Patient ambulates 500 outdoors including grass' with cane or less and prosthesis modified independent to enable community mobility.    Time  12    Period  Weeks    Status  On-going      PT LONG TERM GOAL #5   Title  Patient negotiates ramps, curbs and stairs single rail  with cane or less & prosthesis to enable community access.    Time  12    Period  Weeks    Status  Achieved      PT LONG TERM GOAL #6   Title  Patient ambulates around furniture carrying household items with prosthesis only modified independent.    Time  12    Period   Weeks    Status  Achieved   01/19/19     PT LONG TERM GOAL #7   Title  Dynamic Gait Index with cane & prosthesis >16/24.    Time  12    Period  Weeks    Status  On-going            Plan - 01/31/19 1203    Clinical Impression Statement  Patient presented in clinic with no current pain or complaints. Patient reports not wanting to wear shrinker as she felt like she doesn't need it. PTA encouraged patient to continue wearing it as to protect limb. Patient guided through LE strengthening in standing with continued greater weakness in LLE. Patient indicated that she feels a length difference between LLE and prosthetic to which LLE > prosthetic and to call [employer] rep today. Patient reported she was sweating and is very cautious in regards to sweating but also reported feeling tired and ready to end PT session.    Personal Factors and Comorbidities  Comorbidity 3+;Fitness;Past/Current Experience;Time since onset of injury/illness/exacerbation    Comorbidities  R TTA, COPD, depression, DM, HTN, HLD, obesity, uterine CA, L4-5 decompression sg    Examination-Activity Limitations  Caring for Others;Locomotion Level;Reach Overhead;Stairs;Stand;Transfers    Examination-Participation Restrictions  Community Activity;Driving;Meal Prep;Yard Work;Volunteer    PT Frequency  2x / week    PT Duration  12 weeks    PT Treatment/Interventions  ADLs/Self Care Home Management;Gait training;Stair training;Functional mobility training;Therapeutic activities;Therapeutic exercise;Balance training;Neuromuscular re-education;Patient/family education;Prosthetic Training;Vestibular    PT Next Visit Plan  Continue with POC for gait & balance, bilateral LE strengthening, gait training with proper gait mechanics.    PT Home Exercise Plan  see patient education section    Consulted and Agree with Plan of Care  Patient       Patient will benefit from skilled therapeutic intervention in order to improve the following  deficits and impairments:  Abnormal gait, Decreased activity tolerance, Decreased balance, Decreased endurance, Decreased knowledge of use of DME, Decreased mobility, Decreased strength, Decreased skin integrity, Impaired flexibility, Postural dysfunction, Prosthetic Dependency  Visit Diagnosis: Other abnormalities of gait and mobility  Muscle weakness (generalized)     Problem List Patient Active Problem List   Diagnosis Date Noted  . Vitamin D deficiency 12/27/2018  . Type 2 diabetes mellitus with diabetic peripheral angiopathy and gangrene, without long-term current use of insulin ([place]) 12/27/2018  . DVT of axillary vein, chronic, bilateral ([place]) 12/27/2018  . Hypertension associated with chronic kidney disease due to type 2 diabetes mellitus ([place]) 12/27/2018  . Long term current use of anticoagulant 12/27/2018  . Medically noncompliant 12/27/2018  . S/P unilateral BKA (below knee amputation), right ([place])   . Subtherapeutic international normalized ratio (INR)   . Noncompliance   . Leukocytosis   . Acute blood loss anemia   . Diabetes mellitus type 2 in obese ([place])   . Acquired absence of right leg below knee ([place]) 07/08/2018  . Mild protein-calorie malnutrition ([place])   . Intermittent claudication ([place]) 05/11/2017  . Deep vein thrombosis (DVT) of both lower extremities ([place]) 01/05/2017  .  Peripheral vascular insufficiency ([place]) 09/25/2015  . Hypothyroidism 01/29/2014  . Obesity (BMI 30-39.9) 08/16/2013  . Impetigo 07/28/2013  . Chronic obstructive pulmonary disease ([place])   . Malignant neoplasm of uterus ([place])   . GERD (gastroesophageal reflux disease) 10/18/2012  . Hernia, hiatal 10/18/2012  . Hyperlipidemia associated with type 2 diabetes mellitus ([place]) 04/18/2008  . SMOKER 04/18/2008    Standley Brooking, PTA 01/31/2019, 12:10 PM  [facility] [address] [place], [place], [address] Phone: [phone]   Fax:   [phone]  Name: [name] MRN: [id number] Date of Birth: [birth year]

## 2019-02-03 ENCOUNTER — Ambulatory Visit: Payer: Medicare Other | Admitting: Physical Therapy

## 2019-02-06 ENCOUNTER — Other Ambulatory Visit: Payer: Self-pay

## 2019-02-06 ENCOUNTER — Ambulatory Visit (INDEPENDENT_AMBULATORY_CARE_PROVIDER_SITE_OTHER): Payer: Medicare Other | Admitting: Orthopedic Surgery

## 2019-02-06 ENCOUNTER — Encounter: Payer: Self-pay | Admitting: Orthopedic Surgery

## 2019-02-06 VITALS — Ht 70.0 in | Wt 239.0 lb

## 2019-02-06 DIAGNOSIS — I739 Peripheral vascular disease, unspecified: Secondary | ICD-10-CM | POA: Diagnosis not present

## 2019-02-06 DIAGNOSIS — I749 Embolism and thrombosis of unspecified artery: Secondary | ICD-10-CM

## 2019-02-06 DIAGNOSIS — E1142 Type 2 diabetes mellitus with diabetic polyneuropathy: Secondary | ICD-10-CM

## 2019-02-06 DIAGNOSIS — Z89511 Acquired absence of right leg below knee: Secondary | ICD-10-CM | POA: Diagnosis not present

## 2019-02-09 ENCOUNTER — Ambulatory Visit: Payer: Medicare Other | Admitting: Physical Therapy

## 2019-02-14 ENCOUNTER — Encounter: Payer: Medicare Other | Admitting: Physical Therapy

## 2019-02-17 ENCOUNTER — Encounter: Payer: Medicare Other | Admitting: Physical Therapy

## 2019-02-20 DIAGNOSIS — Z0289 Encounter for other administrative examinations: Secondary | ICD-10-CM

## 2019-03-02 ENCOUNTER — Other Ambulatory Visit: Payer: Self-pay | Admitting: *Deleted

## 2019-03-02 MED ORDER — WARFARIN SODIUM 5 MG PO TABS: ORAL_TABLET | ORAL | 0 refills | Status: DC

## 2019-03-04 ENCOUNTER — Encounter: Payer: Self-pay | Admitting: Orthopedic Surgery

## 2019-03-04 NOTE — Progress Notes (Signed)
Office Visit Note   Patient: [name]           Date of Birth: 1960/08/07           MRN: [id number] Visit Date: 02/06/2019              Requested by: Baruch Gouty, [place],  [place] [address] PCP: Baruch Gouty, FNP  Chief Complaint  Patient presents with  . Right Leg - Follow-up    07/08/18 right BKA       HPI: Patient is a 58 year old woman who is 7 months status post right transtibial amputation she is currently wearing her prosthesis.  She was working with [employer].  Patient inquires whether she should continue with her stump shrinker.  Patient states the stump shrinker resolved her dermatitis.  Assessment & Plan: Visit Diagnoses:  1. Acquired absence of right leg below knee ([place])   2. Type 2 diabetes mellitus with diabetic polyneuropathy, without long-term current use of insulin ([place])   3. Peripheral vascular insufficiency ([place])     Plan: Recommend that she can continue using the stump shrinker under the silicone liner.  Follow-Up Instructions: Return if symptoms worsen or fail to improve.   Ortho Exam  Patient is alert, oriented, no adenopathy, well-dressed, normal affect, normal respiratory effort. Examination patient's transtibial amputation is well consolidated and healed.  There is no dermatitis she has a good gait with wearing her prosthesis.  All ulcers have healed she does not use a cane.  Imaging: No results found. No images are attached to the encounter.  Labs: Lab Results  Component Value Date   HGBA1C 7.6 (H) 12/27/2018   HGBA1C 8.7 (H) 11/09/2018   HGBA1C 8.8 (H) 07/04/2018   ESRSEDRATE 19 10/25/2012   CRP 0.6 10/25/2012   LABURIC 5.7 12/10/2006   REPTSTATUS 07/09/2018 FINAL 07/04/2018   CULT NO GROWTH 5 DAYS 07/04/2018     Lab Results  Component Value Date   ALBUMIN 3.6 (L) 12/27/2018   ALBUMIN 4.0 11/09/2018   ALBUMIN 2.8 (L) 07/12/2018   LABURIC 5.7 12/10/2006    Lab Results  Component Value Date    MG 1.7 07/08/2018   Lab Results  Component Value Date   VD25OH 72.2 12/27/2018   VD25OH 56.8 11/09/2018   VD25OH 51.5 03/15/2018    No results found for: PREALBUMIN CBC EXTENDED Latest Ref Rng & Units 12/27/2018 11/09/2018 07/15/2018  WBC 3.4 - 10.8 x10E3/uL 12.1(H) 10.8 12.4(H)  RBC 3.77 - 5.28 x10E6/uL 5.04 5.33(H) 4.43  HGB 11.1 - 15.9 g/dL 13.7 14.6 12.8  HCT 34.0 - 46.6 % 41.7 45.1 39.4  PLT 150 - 450 x10E3/uL 239 247 281  NEUTROABS 1.4 - 7.0 x10E3/uL 6.5 5.8 7.4  LYMPHSABS 0.7 - 3.1 x10E3/uL 4.5(H) 4.1(H) 3.6     Body mass index is 34.29 kg/m.  Orders:  No orders of the defined types were placed in this encounter.  No orders of the defined types were placed in this encounter.    Procedures: No procedures performed  Clinical Data: No additional findings.  ROS:  All other systems negative, except as noted in the HPI. Review of Systems  Objective: Vital Signs: Ht 5\' 10"  (1.778 m)   Wt 239 lb (108.4 kg)   BMI 34.29 kg/m   Specialty Comments:  No specialty comments available.  PMFS History: Patient Active Problem List   Diagnosis Date Noted  . Vitamin D deficiency 12/27/2018  . Type 2 diabetes mellitus  with diabetic peripheral angiopathy and gangrene, without long-term current use of insulin ([place]) 12/27/2018  . DVT of axillary vein, chronic, bilateral ([place]) 12/27/2018  . Hypertension associated with chronic kidney disease due to type 2 diabetes mellitus ([place]) 12/27/2018  . Long term current use of anticoagulant 12/27/2018  . Medically noncompliant 12/27/2018  . S/P unilateral BKA (below knee amputation), right ([place])   . Subtherapeutic international normalized ratio (INR)   . Noncompliance   . Leukocytosis   . Acute blood loss anemia   . Diabetes mellitus type 2 in obese ([place])   . Acquired absence of right leg below knee ([place]) 07/08/2018  . Mild protein-calorie malnutrition ([place])   . Intermittent claudication ([place]) 05/11/2017  . Deep vein thrombosis  (DVT) of both lower extremities ([place]) 01/05/2017  . Peripheral vascular insufficiency ([place]) 09/25/2015  . Hypothyroidism 01/29/2014  . Obesity (BMI 30-39.9) 08/16/2013  . Impetigo 07/28/2013  . Chronic obstructive pulmonary disease ([place])   . Malignant neoplasm of uterus ([place])   . GERD (gastroesophageal reflux disease) 10/18/2012  . Hernia, hiatal 10/18/2012  . Hyperlipidemia associated with type 2 diabetes mellitus ([place]) 04/18/2008  . SMOKER 04/18/2008   Past Medical History:  Diagnosis Date  . COPD (chronic obstructive pulmonary disease) ([place])   . Depression   . DM (diabetes mellitus) ([place])   . DVT (deep venous thrombosis) ([facility])    x5  . Family history of colon cancer   . GERD (gastroesophageal reflux disease)   . Hyperlipemia   . Hypertension   . Hypothyroid   . Obesity   . Osteomyelitis of great toe of right foot ([place]) 07/04/2018  . PVD (peripheral vascular disease) ([place])   . Unilateral complete BKA, right, initial encounter ([place]) 07/11/2018  . Unilateral complete BKA, right, subsequent encounter ([place])   . Uterine cancer ([place])   . UTI (urinary tract infection)     Family History  Problem Relation Age of Onset  . Colon cancer Brother 68  . Hypertension Mother   . Hypothyroidism Mother   . Heart disease Father   . Diabetes Father   . Hyperlipidemia Father   . Clotting disorder Brother   . Diabetes Brother   . Heart disease Brother     Past Surgical History:  Procedure Laterality Date  . ABDOMINAL HYSTERECTOMY    . AMPUTATION Right 07/08/2018   Procedure: RIGHT BELOW KNEE AMPUTATION;  Surgeon: [name], MD;  Location: [facility];  Service: Orthopedics;  Laterality: Right;  . CESAREAN SECTION    . FEMORAL BYPASS     x 5  . LUMBAR DISC SURGERY     L4-L5  . TOE AMPUTATION     right  . TONSILLECTOMY     Social History   Occupational History  . Occupation: Product manager: [employer]  . Occupation: retired  Tobacco Use  . Smoking status: Current  Every Day Smoker    Packs/day: 0.50    Years: 38.00    Pack years: 19.00    Types: Cigarettes  . Smokeless tobacco: Never Used  Substance and Sexual Activity  . Alcohol use: No    Alcohol/week: 0.0 standard drinks  . Drug use: Yes    Frequency: 4.0 times per week    Types: Marijuana  . Sexual activity: Not Currently    Birth control/protection: Surgical

## 2019-03-14 ENCOUNTER — Other Ambulatory Visit: Payer: Self-pay | Admitting: Pharmacist Clinician (PhC)/ Clinical Pharmacy Specialist

## 2019-03-28 ENCOUNTER — Other Ambulatory Visit: Payer: Self-pay

## 2019-03-29 ENCOUNTER — Other Ambulatory Visit: Payer: Self-pay | Admitting: Family Medicine

## 2019-03-29 ENCOUNTER — Ambulatory Visit (INDEPENDENT_AMBULATORY_CARE_PROVIDER_SITE_OTHER): Payer: Medicare Other | Admitting: Family Medicine

## 2019-03-29 ENCOUNTER — Encounter: Payer: Self-pay | Admitting: Family Medicine

## 2019-03-29 ENCOUNTER — Other Ambulatory Visit: Payer: Self-pay

## 2019-03-29 VITALS — BP 113/70 | HR 76 | Temp 97.8°F | Resp 20 | Ht 70.0 in | Wt 241.0 lb

## 2019-03-29 DIAGNOSIS — I82A23 Chronic embolism and thrombosis of axillary vein, bilateral: Secondary | ICD-10-CM | POA: Diagnosis not present

## 2019-03-29 DIAGNOSIS — Z7901 Long term (current) use of anticoagulants: Secondary | ICD-10-CM | POA: Diagnosis not present

## 2019-03-29 DIAGNOSIS — E559 Vitamin D deficiency, unspecified: Secondary | ICD-10-CM

## 2019-03-29 DIAGNOSIS — K449 Diaphragmatic hernia without obstruction or gangrene: Secondary | ICD-10-CM

## 2019-03-29 DIAGNOSIS — E039 Hypothyroidism, unspecified: Secondary | ICD-10-CM | POA: Diagnosis not present

## 2019-03-29 DIAGNOSIS — E1152 Type 2 diabetes mellitus with diabetic peripheral angiopathy with gangrene: Secondary | ICD-10-CM | POA: Diagnosis not present

## 2019-03-29 DIAGNOSIS — K219 Gastro-esophageal reflux disease without esophagitis: Secondary | ICD-10-CM

## 2019-03-29 DIAGNOSIS — E1122 Type 2 diabetes mellitus with diabetic chronic kidney disease: Secondary | ICD-10-CM | POA: Diagnosis not present

## 2019-03-29 DIAGNOSIS — E785 Hyperlipidemia, unspecified: Secondary | ICD-10-CM

## 2019-03-29 DIAGNOSIS — F339 Major depressive disorder, recurrent, unspecified: Secondary | ICD-10-CM

## 2019-03-29 DIAGNOSIS — I129 Hypertensive chronic kidney disease with stage 1 through stage 4 chronic kidney disease, or unspecified chronic kidney disease: Secondary | ICD-10-CM | POA: Diagnosis not present

## 2019-03-29 DIAGNOSIS — E1169 Type 2 diabetes mellitus with other specified complication: Secondary | ICD-10-CM

## 2019-03-29 DIAGNOSIS — I749 Embolism and thrombosis of unspecified artery: Secondary | ICD-10-CM

## 2019-03-29 DIAGNOSIS — Z86718 Personal history of other venous thrombosis and embolism: Secondary | ICD-10-CM

## 2019-03-29 LAB — BAYER DCA HB A1C WAIVED: HB A1C (BAYER DCA - WAIVED): 6.8 % (ref <7.0)

## 2019-03-29 LAB — COAGUCHEK XS/INR WAIVED
INR: 2.1 — ABNORMAL HIGH (ref 0.9–1.1)
Prothrombin Time: 24.6 s

## 2019-03-29 LAB — POCT INR: INR: 2.1 (ref 2.0–3.0)

## 2019-03-29 MED ORDER — ESCITALOPRAM OXALATE 10 MG PO TABS: 10.0000 mg | ORAL_TABLET | Freq: Every day | ORAL | 5 refills | Status: DC

## 2019-03-29 NOTE — Progress Notes (Signed)
Subjective:  Patient ID: [name], female    DOB: [birth year], 58 y.o.   MRN: [id number]  Patient Care Team: Baruch Gouty, FNP as PCP - General (Family Medicine) [name], MD as Consulting Physician (Orthopedic Surgery)   Chief Complaint:  Medical Management of Chronic Issues (3 mo ), Hyperlipidemia, Diabetes, and Hypertension   HPI: [name] is a 58 y.o. female presenting on 03/29/2019 for Medical Management of Chronic Issues (3 mo ), Hyperlipidemia, Diabetes, and Hypertension  1.Hypertension Complaint with meds - Yes Current Medications - losaratan Checking BP at home ranging: doesn't check at home Exercising Regularly - No Watching Salt intake - Yes Pertinent ROS:  Headache - No Fatigue - No Visual Disturbances - No Chest pain - No Dyspnea - No Palpitations - No LE edema - No They report good compliance with medications and can restate their regimen by memory. No medication side effects.  2. Type 2 Diabetes Pt presents for a follow up evaluation of Type 2 diabetes mellitus.  Current symptoms include paresthesia of the fingers. Patient denies foot ulcerations, paresthesia of the feet, polydipsia, polyuria, visual disturbances and weight loss.  Current diabetic medications include metformin, glimepiride Compliant with meds - Yes  Current monitoring regimen: office lab tests - 4 times yearly Home blood sugar records: doesn't check at home Any episodes of hypoglycemia? no  Known diabetic complications: peripheral vascular disease Cardiovascular risk factors: diabetes mellitus, dyslipidemia, hypertension, obesity (BMI >= 30 kg/m2), sedentary lifestyle and smoking/ tobacco exposure Eye exam current (within one year): yes Podiatry yearly?  No Weight trend: increasing steadily Current diet: in general, a "healthy" diet  , low salt Current exercise: none  Urine microalbumin UTD? Yes  Is She on ACE inhibitor or angiotensin II receptor  blocker?  Yes,  Losartan  Is She on statin? Yes paravastatin  Is She on ASA 81 mg daily?  Yes, also on plavix  3. Hyperlipidemia Compliant with medications - Yes Current medications - pravastatin  Side effects from medications - No Diet - "healthy", tries to eat low fat  Exercise - does not exercise since leg amputation  4. Hiatal Hernia Reports increased discomfort from hiatal hernia. Also reports an increase in heartburn symptoms. Reports heartburn daily, usually at night. Takes protonix daily. Has been experiencing episodes of NBNB vomiting, chills, sweating, and abdominal bloating that lasts for 5-6 hours about once every 2 weeks. Has not noticed any correlation with a particular food or following a high fat intake. Has daily soft bowel movement. Denies abdominal pain, diarrhea, blood in stool.  Denies fever, appetite change, or weight loss.   5. Depression Reports a past hx of depression for which she took Lexapro 10 mg with good control. She has not taken Lexapro in over a year but is now experiencing depressed mood, difficulty sleeping, crying spells, and loss of interest. Denies SI or HI. She would like to try Lexapro again.   6. Bilateral upper arm pain She reports pain in bilateral upper arms for the last few months. She denies numbness or tingling in either arm. She denies trauma or injury. She is unsure of anything that aggravates or relieves the pain. States the pain is aching and only in her upper arms. States worse at night. No pain at present. No weakness, loss of function, or decreased grip strength.   Family and personal medical history reviewed. Smoking and ETOH history reviewed.   Lab Results  Component Value Date  CHOL 150 12/27/2018   HDL 29 (L) 12/27/2018   LDLCALC 78 12/27/2018   TRIG 215 (H) 12/27/2018   CHOLHDL 5.2 (H) 12/27/2018     BP Readings from Last 3 Encounters:  03/29/19 113/70  12/27/18 92/69  08/16/18 116/80   CMP Latest Ref Rng & Units  12/27/2018 11/09/2018 07/12/2018  Glucose 65 - 99 mg/dL 61(L) 171(H) 257(H)  BUN 6 - 24 mg/dL 15 15 13   Creatinine 0.57 - 1.00 mg/dL 0.68 0.73 0.89  Sodium 134 - 144 mmol/L 139 139 138  Potassium 3.5 - 5.2 mmol/L 4.0 4.3 4.2  Chloride 96 - 106 mmol/L 103 102 101  CO2 20 - 29 mmol/L 20 20 27   Calcium 8.7 - 10.2 mg/dL 9.5 9.6 8.7(L)  Total Protein 6.0 - 8.5 g/dL 6.8 7.2 6.7  Total Bilirubin 0.0 - 1.2 mg/dL <0.2 0.2 0.3  Alkaline Phos 39 - 117 IU/L 62 64 39  AST 0 - 40 IU/L 15 17 43(H)  ALT 0 - 32 IU/L 15 16 24      Relevant past medical, surgical, family, and social history reviewed and updated as indicated.  Allergies and medications reviewed and updated. Date reviewed: Chart in Epic.   Past Medical History:  Diagnosis Date  . COPD (chronic obstructive pulmonary disease) ([place])   . Depression   . DM (diabetes mellitus) ([place])   . DVT (deep venous thrombosis) ([facility])    x5  . Family history of colon cancer   . GERD (gastroesophageal reflux disease)   . Hyperlipemia   . Hypertension   . Hypothyroid   . Obesity   . Osteomyelitis of great toe of right foot ([place]) 07/04/2018  . PVD (peripheral vascular disease) ([place])   . Unilateral complete BKA, right, initial encounter ([place]) 07/11/2018  . Unilateral complete BKA, right, subsequent encounter ([place])   . Uterine cancer ([place])   . UTI (urinary tract infection)     Past Surgical History:  Procedure Laterality Date  . ABDOMINAL HYSTERECTOMY    . AMPUTATION Right 07/08/2018   Procedure: RIGHT BELOW KNEE AMPUTATION;  Surgeon: [name], MD;  Location: [facility];  Service: Orthopedics;  Laterality: Right;  . CESAREAN SECTION    . FEMORAL BYPASS     x 5  . LUMBAR DISC SURGERY     L4-L5  . TOE AMPUTATION     right  . TONSILLECTOMY      Social History   Socioeconomic History  . Marital status: Married    Spouse name: Kasandra Knudsen  . Number of children: 1  . Years of education: 3  . Highest education level: Master's degree (e.g., MA, MS, MEng,  MEd, MSW, MBA)  Occupational History  . Occupation: Product manager: [employer]  . Occupation: retired  Scientific laboratory technician  . Financial resource strain: Not hard at all  . Food insecurity    Worry: Never true    Inability: Never true  . Transportation needs    Medical: No    Non-medical: No  Tobacco Use  . Smoking status: Current Every Day Smoker    Packs/day: 0.50    Years: 38.00    Pack years: 19.00    Types: Cigarettes  . Smokeless tobacco: Never Used  Substance and Sexual Activity  . Alcohol use: No    Alcohol/week: 0.0 standard drinks  . Drug use: Yes    Frequency: 4.0 times per week    Types: Marijuana  . Sexual activity: Not Currently  Birth control/protection: Surgical  Lifestyle  . Physical activity    Days per week: 0 days    Minutes per session: 0 min  . Stress: Not at all  Relationships  . Social connections    Talks on phone: More than three times a week    Gets together: More than three times a week    Attends religious service: Never    Active member of club or organization: No    Attends meetings of clubs or organizations: Never    Relationship status: Married  . Intimate partner violence    Fear of current or ex partner: No    Emotionally abused: No    Physically abused: No    Forced sexual activity: No  Other Topics Concern  . Not on file  Social History Narrative  . Not on file    Outpatient Encounter Medications as of 03/29/2019  Medication Sig  . acetaminophen (TYLENOL) 325 MG tablet Take 1-2 tablets (325-650 mg total) by mouth every 4 (four) hours as needed for mild pain.  Marland Kitchen albuterol (PROVENTIL HFA;VENTOLIN HFA) 108 (90 Base) MCG/ACT inhaler Inhale 2 puffs into the lungs every 6 (six) hours as needed for wheezing or shortness of breath.  Marland Kitchen aspirin EC 81 MG EC tablet Take 1 tablet (81 mg total) by mouth daily.  . Cholecalciferol (VITAMIN D3) 5000 units TABS Take 1 tablet by mouth daily.  . clopidogrel (PLAVIX) 75 MG tablet Take 1  tablet (75 mg total) by mouth daily.  . furosemide (LASIX) 20 MG tablet Take 1 tablet (20 mg total) by mouth daily.  Marland Kitchen glimepiride (AMARYL) 2 MG tablet Take 1 tablet (2 mg total) by mouth daily before breakfast.  . levothyroxine (SYNTHROID) 100 MCG tablet Take 1 tablet (100 mcg total) by mouth daily before breakfast.  . losartan (COZAAR) 50 MG tablet Take 1 tablet (50 mg total) by mouth daily.  . metFORMIN (GLUCOPHAGE) 1000 MG tablet Take 1 tablet (1,000 mg total) by mouth 2 (two) times daily with a meal.  . ONE TOUCH ULTRA TEST test strip TEST BLOOD SUGARS TWICE A DAY  . pantoprazole (PROTONIX) 40 MG tablet Take 1 tablet (40 mg total) by mouth daily.  . pravastatin (PRAVACHOL) 20 MG tablet Take 1 tablet (20 mg total) by mouth daily.  Marland Kitchen tretinoin (RETIN-A) 0.05 % cream Apply topically at bedtime.  . triamcinolone cream (KENALOG) 0.1 % Apply topically 2 (two) times daily.  Marland Kitchen warfarin (COUMADIN) 5 MG tablet TAKE AS DIRECTED UP TO 1 & 1/2 TABLETS A DAY  . escitalopram (LEXAPRO) 10 MG tablet Take 1 tablet (10 mg total) by mouth daily.  . [DISCONTINUED] Icosapent Ethyl (VASCEPA) 1 g CAPS Take 2 capsules (2 g total) by mouth 2 (two) times daily with a meal.   No facility-administered encounter medications on file as of 03/29/2019.     Allergies  Allergen Reactions  . Aleve [Naproxen Sodium] Hives and Itching  . Cortisone Swelling    Internal organs  . Prednisone Swelling    Makes internal organs swell  . Vancomycin Itching    Review of Systems  Constitutional: Positive for chills and diaphoresis. Negative for appetite change, fatigue, fever and unexpected weight change.  HENT: Negative for trouble swallowing.   Eyes: Negative for visual disturbance.  Respiratory: Negative for shortness of breath.   Cardiovascular: Negative for chest pain, palpitations and leg swelling.  Gastrointestinal: Positive for abdominal distention and vomiting. Negative for abdominal pain, blood in stool,  constipation, diarrhea, nausea  and rectal pain.  Endocrine: Negative for polydipsia, polyphagia and polyuria.  Genitourinary: Negative for difficulty urinating.  Musculoskeletal: Positive for myalgias (bilateral upper arms). Negative for arthralgias and joint swelling.  Skin: Negative for color change, rash and wound.  Neurological: Negative for dizziness, facial asymmetry, weakness and headaches.  Psychiatric/Behavioral: Positive for dysphoric mood and sleep disturbance. Negative for agitation, behavioral problems, self-injury and suicidal ideas. The patient is not nervous/anxious.        Objective:  BP 113/70   Pulse 76   Temp 97.8 F (36.6 C)   Resp 20   Ht 5' 10"  (1.778 m)   Wt 241 lb (109.3 kg)   SpO2 96%   BMI 34.58 kg/m    Wt Readings from Last 3 Encounters:  03/29/19 241 lb (109.3 kg)  02/06/19 239 lb (108.4 kg)  12/27/18 239 lb (108.4 kg)    Physical Exam Constitutional:      General: She is not in acute distress.    Appearance: Normal appearance. She is obese. She is not ill-appearing, toxic-appearing or diaphoretic.  HENT:     Nose: Nose normal.     Mouth/Throat:     Mouth: Mucous membranes are moist.     Pharynx: Oropharynx is clear.  Eyes:     Extraocular Movements: Extraocular movements intact.     Pupils: Pupils are equal, round, and reactive to light.  Neck:     Musculoskeletal: Normal range of motion and neck supple. No muscular tenderness.     Vascular: No carotid bruit.  Cardiovascular:     Rate and Rhythm: Normal rate and regular rhythm.     Heart sounds: Normal heart sounds. No murmur.  Pulmonary:     Effort: Pulmonary effort is normal.     Breath sounds: Normal breath sounds.  Abdominal:     General: Bowel sounds are normal. There is no distension.     Palpations: Abdomen is soft. There is no mass.     Tenderness: There is no abdominal tenderness. There is no right CVA tenderness, left CVA tenderness, guarding or rebound. Negative signs  include Murphy's sign, Rovsing's sign and McBurney's sign.  Musculoskeletal: Normal range of motion.     Right shoulder: Normal.     Left shoulder: Normal.     Right elbow: Normal.    Left elbow: Normal.     Right lower leg: No edema.     Left lower leg: No edema.     Comments: Full ROM and strength in bilateral upper extremities. Sensation intact.   Feet:     Right foot:     Amputation: Right leg is amputated below knee.  Lymphadenopathy:     Cervical: No cervical adenopathy.  Skin:    General: Skin is warm and dry.     Capillary Refill: Capillary refill takes less than 2 seconds.  Neurological:     General: No focal deficit present.     Mental Status: She is alert and oriented to person, place, and time. Mental status is at baseline.     Gait: Gait abnormal (uses right lower leg prosthetic with cane).  Psychiatric:        Mood and Affect: Mood normal.        Behavior: Behavior normal.        Thought Content: Thought content normal.        Judgment: Judgment normal.     Results for orders placed or performed in visit on 03/29/19  POCT INR  Result Value  Ref Range   INR 2.1 2.0 - 3.0       Pertinent labs & imaging results that were available during my care of the patient were reviewed by me and considered in my medical decision making.  Assessment & Plan:  [name] was seen today for medical management of chronic issues, hyperlipidemia, diabetes and hypertension.  Diagnoses and all orders for this visit:  Hyperlipidemia associated with type 2 diabetes mellitus ([place]) Trial statin every other day for myalgias in bilaterally upper arms. Diet encouraged - increase intake of fresh fruits and vegetables, increase intake of lean proteins. Bake, broil, or grill foods. Avoid fried, greasy, and fatty foods. Avoid fast foods. Increase intake of fiber-rich whole grains. Exercise encouraged - at least 150 minutes per week and advance as tolerated.  Goal BMI < 25. Follow up in 3 months as  discussed.  -     CBC with Differential/Platelet -     Lipid panel  Type 2 diabetes mellitus with diabetic peripheral angiopathy and gangrene, without long-term current use of insulin ([facility]) Lab Results  Component Value Date   HGBA1C 7.6 (H) 12/27/2018   HGBA1C 8.7 (H) 11/09/2018   HGBA1C 8.8 (H) 07/04/2018   Discussed and reviewed A1C of 6.8 today in office Diabetes Control: well controlled Instruction/counseling given: reminded to get eye exam, discussed foot care, discussed the need for weight loss, discussed diet and provided printed educational material   1.  Rx changes: none 2.  Education: Reviewed 'ABCs' of diabetes management (respective goals in parentheses):  A1C (<7), blood pressure (<130/80), BMI (<25), and cholesterol (LDL <100). 3.  Discussed pathophysiology of DM; difference between type 1 and type 2 DM. 4.  CHO counting diet discussed.  Reviewed CHO amount in various foods and how to read nutrition labels.  Discussed recommended serving sizes.  5.  Recommend check BG a few times a week and record readings to bring to next appointment. Report persistent high or low readings. 6.  Recommended increase physical activity - goal is 150 minutes per week and advance as tolerated. 7.  Adequate sleep, at least 6-8 hours per night.  8.  Smoking cessation.  9.  Follow up: 3 months    -     CBC with Differential/Platelet -     Bayer DCA Hb A1c Waived  Acquired hypothyroidism Thyroid disease has been well controlled. Labs are pending. Adjustments to regimen will be made if warranted. Make sure to take medications on an empty stomach with a full glass of water. Make sure to avoid vitamins or supplements for at least 4 hours before and 4 hours after taking medications. Repeat labs in 3 months if adjustments are made and in 6 months if stable.   -     CBC with Differential/Platelet -     Thyroid Panel With TSH  Vitamin D deficiency  Labs pending. Continue repletion therapy. If  indicated, will change repletion dosage. Eat foods rich in Vit D including milk, orange juice, yogurt with vitamin D added, salmon or mackerel, canned tuna fish, cereals with vitamin D added, and cod liver oil. Get out in the sun but make sure to wear at least SPF 30 sunscreen.  -     CBC with Differential/Platelet -     VITAMIN D 25 Hydroxy (Vit-D Deficiency, Fractures)  Hypertension associated with chronic kidney disease due to type 2 diabetes mellitus ([facility]) BP well controlled. Changes were not made in regimen today. Goal BP is 130/80. Pt aware to  report any persistent high or low readings. DASH diet and exercise encouraged. Exercise at least 150 minutes per week and increase as tolerated. Goal BMI < 25. Stress management encouraged. Avoid nicotine and tobacco product use. Avoid excessive alcohol and NSAID's. Avoid more than 2000 mg of sodium daily. Medications as prescribed. Follow up as scheduled.  -     CBC with Differential/Platelet -     CMP14+EGFR  Long term current use of anticoagulant History of DVT (deep vein thrombosis) INR 2.1 today in office. Goal is 2-3. Continue current coumadin regimen. CBC pending. -     CBC with Differential/Platelet -     CoaguChek XS/INR Waived  Hernia, hiatal Gastroesophageal reflux disease without esophagitis No red flags present. Diet discussed. Avoid fried, spicy, fatty, greasy, and acidic foods. Avoid caffeine, nicotine, and alcohol. Do not eat 2-3 hours before bedtime and stay upright for at least 1-2 hours after eating. Eat small frequent meals. Avoid NSAID's like motrin and aleve. Medications as prescribed. Report any new or worsening symptoms. Referral to GI given hx of hiatal hernia with increased symptoms reported.   -     Ambulatory referral to Gastroenterology  Depression, recurrent ([place]) Start lexapro as below. Has tolerated well in the past. Report any new or worsening symptoms.  If your symptoms worsen or you have thoughts of  suicide/homicide, PLEASE SEEK IMMEDIATE MEDICAL ATTENTION.  You may always call the National Suicide Hotline.  This is available 24 hours a day, 7 days a week.  Their number is: (231)581-0871  Taking the medicine as directed and not missing any doses is one of the best things you can do to treat your depression.  Here are some things to keep in mind:  1) Side effects (stomach upset, some increased anxiety) may happen before you notice a benefit.  These side effects typically go away over time. 2) Changes to your dose of medicine or a change in medication all together is sometimes necessary 3) Most people need to be on medication at least 12 months 4) Many people will notice an improvement within two weeks but the full effect of the medication can take up to 4-6 weeks 5) Stopping the medication when you start feeling better often results in a return of symptoms 6) Never discontinue your medication without contacting a health care professional first.  Some medications require gradual discontinuation/ taper and can make you sick if you stop them abruptly.  If your symptoms worsen or you have thoughts of suicide/homicide, PLEASE SEEK IMMEDIATE MEDICAL ATTENTION.  You may always call:  National Suicide Hotline: 714-375-7548 [place]: 432-622-9394 Crisis Recovery in [place]: (717)472-5237  These are available 24 hours a day, 7 days a week. -     escitalopram (LEXAPRO) 10 MG tablet; Take 1 tablet (10 mg total) by mouth daily.  Continue all other maintenance medications.   Total time spent with patient 45 minutes.  Greater than 50% of encounter spent in coordination of care/counseling.  Follow up plan: Return in about 3 months (around 06/29/2019), or if symptoms worsen or fail to improve, for DM.  Continue healthy lifestyle choices, including diet (rich in fruits, vegetables, and lean proteins, and low in salt and simple carbohydrates) and exercise (at least 30 minutes of  moderate physical activity daily).  Educational handout given for dash diet.  The above assessment and management plan was discussed with the patient. The patient verbalized understanding of and has agreed to the management plan. Patient is aware  to call the clinic if they develop any new symptoms or if symptoms persist or worsen. Patient is aware when to return to the clinic for a follow-up visit. Patient educated on when it is appropriate to go to the emergency department.   Marjorie Smolder, RN, FNP student [facility] [phone]  Monia Pouch, [facility] [address] [place], [place] [address] [phone]

## 2019-03-29 NOTE — Patient Instructions (Signed)

## 2019-03-30 LAB — CBC WITH DIFFERENTIAL/PLATELET
Basophils Absolute: 0.1 10*3/uL (ref 0.0–0.2)
Basos: 1 %
EOS (ABSOLUTE): 0.5 10*3/uL — ABNORMAL HIGH (ref 0.0–0.4)
Eos: 4 %
Hematocrit: 44.1 % (ref 34.0–46.6)
Hemoglobin: 14.6 g/dL (ref 11.1–15.9)
Immature Grans (Abs): 0 10*3/uL (ref 0.0–0.1)
Immature Granulocytes: 0 %
Lymphocytes Absolute: 5.4 10*3/uL — ABNORMAL HIGH (ref 0.7–3.1)
Lymphs: 43 %
MCH: 28.1 pg (ref 26.6–33.0)
MCHC: 33.1 g/dL (ref 31.5–35.7)
MCV: 85 fL (ref 79–97)
Monocytes Absolute: 0.8 10*3/uL (ref 0.1–0.9)
Monocytes: 6 %
Neutrophils Absolute: 5.8 10*3/uL (ref 1.4–7.0)
Neutrophils: 46 %
Platelets: 273 10*3/uL (ref 150–450)
RBC: 5.19 x10E6/uL (ref 3.77–5.28)
RDW: 14.8 % (ref 11.7–15.4)
WBC: 12.6 10*3/uL — ABNORMAL HIGH (ref 3.4–10.8)

## 2019-03-30 LAB — THYROID PANEL WITH TSH
Free Thyroxine Index: 3.1 (ref 1.2–4.9)
T3 Uptake Ratio: 31 % (ref 24–39)
T4, Total: 9.9 ug/dL (ref 4.5–12.0)
TSH: 3.18 u[IU]/mL (ref 0.450–4.500)

## 2019-03-30 LAB — LIPID PANEL
Chol/HDL Ratio: 6.1 ratio — ABNORMAL HIGH (ref 0.0–4.4)
Cholesterol, Total: 200 mg/dL — ABNORMAL HIGH (ref 100–199)
HDL: 33 mg/dL — ABNORMAL LOW (ref >39)
LDL Chol Calc (NIH): 125 mg/dL — ABNORMAL HIGH (ref 0–99)
Triglycerides: 236 mg/dL — ABNORMAL HIGH (ref 0–149)
VLDL Cholesterol Cal: 42 mg/dL — ABNORMAL HIGH (ref 5–40)

## 2019-03-30 LAB — CMP14+EGFR
ALT: 18 IU/L (ref 0–32)
AST: 20 IU/L (ref 0–40)
Albumin/Globulin Ratio: 1.2 (ref 1.2–2.2)
Albumin: 3.9 g/dL (ref 3.8–4.9)
Alkaline Phosphatase: 55 IU/L (ref 39–117)
BUN/Creatinine Ratio: 16 (ref 9–23)
BUN: 11 mg/dL (ref 6–24)
Bilirubin Total: <0.2 mg/dL (ref 0.0–1.2)
CO2: 22 mmol/L (ref 20–29)
Calcium: 9.8 mg/dL (ref 8.7–10.2)
Chloride: 103 mmol/L (ref 96–106)
Creatinine, Ser: 0.67 mg/dL (ref 0.57–1.00)
GFR calc Af Amer: 114 mL/min/{1.73_m2} (ref >59)
GFR calc non Af Amer: 99 mL/min/{1.73_m2} (ref >59)
Globulin, Total: 3.2 g/dL (ref 1.5–4.5)
Glucose: 74 mg/dL (ref 65–99)
Potassium: 4.6 mmol/L (ref 3.5–5.2)
Sodium: 141 mmol/L (ref 134–144)
Total Protein: 7.1 g/dL (ref 6.0–8.5)

## 2019-03-30 LAB — VITAMIN D 25 HYDROXY (VIT D DEFICIENCY, FRACTURES): Vit D, 25-Hydroxy: 67.6 ng/mL (ref 30.0–100.0)

## 2019-03-31 ENCOUNTER — Telehealth: Payer: Self-pay | Admitting: Family Medicine

## 2019-03-31 NOTE — Telephone Encounter (Signed)
Pt aware by detailed VM of all labs

## 2019-04-04 ENCOUNTER — Other Ambulatory Visit: Payer: Self-pay | Admitting: Pharmacist Clinician (PhC)/ Clinical Pharmacy Specialist

## 2019-04-05 ENCOUNTER — Other Ambulatory Visit: Payer: Self-pay | Admitting: Family Medicine

## 2019-04-05 DIAGNOSIS — Z789 Other specified health status: Secondary | ICD-10-CM

## 2019-04-05 DIAGNOSIS — E785 Hyperlipidemia, unspecified: Secondary | ICD-10-CM

## 2019-04-05 DIAGNOSIS — E1169 Type 2 diabetes mellitus with other specified complication: Secondary | ICD-10-CM

## 2019-04-05 MED ORDER — LIVALO 2 MG PO TABS: 2.0000 mg | ORAL_TABLET | Freq: Every day | ORAL | 6 refills | Status: DC

## 2019-04-05 NOTE — Telephone Encounter (Signed)
Will try Livalo 

## 2019-04-05 NOTE — Telephone Encounter (Signed)
No this is new. She should add over the counter omega 3 and stop the old statin. She can freeze the omega 3 to avoid a bad taste.

## 2019-04-05 NOTE — Telephone Encounter (Signed)
Is it in addition to what she is taking?

## 2019-04-05 NOTE — Progress Notes (Signed)
Livalo prescribed due to statin intolerance

## 2019-04-05 NOTE — Telephone Encounter (Signed)
Patient aware and verbalized understanding. °

## 2019-04-07 ENCOUNTER — Telehealth: Payer: Self-pay | Admitting: *Deleted

## 2019-04-07 NOTE — Telephone Encounter (Addendum)
Prior Auth for Livalo 2mg -APPROVED 03/08/19 through 04/06/20  Key: [id number] -   PA Case ID: [id number]  Pharmacy notified

## 2019-04-11 ENCOUNTER — Other Ambulatory Visit: Payer: Self-pay | Admitting: Family Medicine

## 2019-04-11 NOTE — Telephone Encounter (Signed)
Livalo 2mg  Approved through insurance but it will still cost $297 for a 30 day supply and pt said she can't afford this. Can something else be sent in?

## 2019-04-12 ENCOUNTER — Other Ambulatory Visit: Payer: Self-pay | Admitting: Family Medicine

## 2019-04-12 DIAGNOSIS — E785 Hyperlipidemia, unspecified: Secondary | ICD-10-CM

## 2019-04-12 DIAGNOSIS — E1169 Type 2 diabetes mellitus with other specified complication: Secondary | ICD-10-CM

## 2019-04-12 MED ORDER — REPATHA 140 MG/ML ~~LOC~~ SOSY: 140.0000 mg | PREFILLED_SYRINGE | SUBCUTANEOUS | 6 refills | Status: DC

## 2019-04-12 NOTE — Telephone Encounter (Signed)
Repatha sent to pharmacy. Can come by office for education on first dosing. This will be SQ every 2 weeks.

## 2019-04-12 NOTE — Telephone Encounter (Signed)
Would you be will to do try every other week Repatha injections? This is for uncontrolled lipids on statin therapy

## 2019-04-12 NOTE — Telephone Encounter (Signed)
Patient states as long as its affordable and it doesn't interact with her other meds she is willing to try it

## 2019-04-12 NOTE — Progress Notes (Signed)
Lipid Panel     Component Value Date/Time   CHOL 200 (H) 03/29/2019 1502   CHOL 136 12/21/2012 1707   TRIG 236 (H) 03/29/2019 1502   TRIG 192 (H) 04/21/2013 1512   TRIG 164 (H) 12/21/2012 1707   HDL 33 (L) 03/29/2019 1502   HDL 32 (L) 04/21/2013 1512   HDL 27 (L) 12/21/2012 1707   CHOLHDL 6.1 (H) 03/29/2019 1502   LDLCALC 125 (H) 03/29/2019 1502   LDLCALC 104 (H) 04/21/2013 1512   LDLCALC 76 12/21/2012 1707   LABVLDL 42 (H) 03/29/2019 1502   Ongoing elevated lipids despite therapy. Statin intolerance. Will start repatha.

## 2019-04-13 NOTE — Telephone Encounter (Signed)
Aware of new medication but she will call pharmacy to check price and call us back.

## 2019-04-14 ENCOUNTER — Telehealth: Payer: Self-pay | Admitting: *Deleted

## 2019-04-14 NOTE — Telephone Encounter (Signed)
Trying to do PA for Repatha and this is one of the questions  Is the requested medication being prescribed by, or in consultation with, a cardiologist; an endocrinologist; or a physician who focuses in the treatment of cardiovascular (CV) risk management and/or lipid disorders?  I don't see any notes from an Endocrinologist/Cardiologist/Lipid specialist-if I answer no I don't think it will be approved as it stops all questions and says send to plan. Doesn't have a spot on the PA to document any further reasons when I answer no.   I think she may have to see a specialist possibly to get this treatment initiated. I will send it to her insurance plan if you would like and see what happens.

## 2019-04-14 NOTE — Telephone Encounter (Signed)
Spoke to pt and she said she isn't going to the Cardiologist just to start a medication.

## 2019-04-14 NOTE — Telephone Encounter (Signed)
Try to complete the PA for the Repatha, if it does not go through, let me know.

## 2019-04-14 NOTE — Telephone Encounter (Signed)
We can ask her if she is willing to see cardiology due to the need to have this medication approved.

## 2019-04-17 NOTE — Telephone Encounter (Signed)
Prior Auth for Repatha-In Process  Key: [id number]  PA [id number]    [employer] is reviewing your PA request and will respond within 24 hours for Medicaid or up to 72 hours for non-Medicaid plans, based on the required timeframe determined by state or federal regulations. To check for an update later, open this request from your dashboard.

## 2019-04-18 ENCOUNTER — Telehealth: Payer: Self-pay | Admitting: Family Medicine

## 2019-04-18 NOTE — Telephone Encounter (Signed)
Prior Auth for Repatha-DEINIED  the requested medication is not being prescribed by, or in consultation with, a cardiologist; an endocrinologist; or a physician who focuses in the treatment of cardiovascular (CV) risk management and/or lipid disorders?

## 2019-04-18 NOTE — Chronic Care Management (AMB) (Signed)
Chronic Care Management   Note  04/18/2019 Name: [name] MRN: [id number] DOB: [birth year]  Larry Sierras Tuggle-Minjares is a 58 y.o. year old female who is a primary care patient of Rakes, Connye Burkitt, FNP. I reached out to [name] by phone today in response to a referral sent by Ms. Larry Sierras Tuggle-Navarrete's health plan.     Ms. Geoffroy was given information about Chronic Care Management services today including:  1. CCM service includes personalized support from designated clinical staff supervised by her physician, including individualized plan of care and coordination with other care providers 2. 24/7 contact phone numbers for assistance for urgent and routine care needs. 3. Service will only be billed when office clinical staff spend 20 minutes or more in a month to coordinate care. 4. Only one practitioner may furnish and bill the service in a calendar month. 5. The patient may stop CCM services at any time (effective at the end of the month) by phone call to the office staff. 6. The patient will be responsible for cost sharing (co-pay) of up to 20% of the service fee (after annual deductible is met).  Patient did not agree to enrollment in care management services and does not wish to consider at this time.  Follow up plan: The patient has been provided with contact information for the chronic care management team and has been advised to call with any health related questions or concerns.   [place], [place] [address] Direct Dial: [email or web address]  Website: [email or web address]

## 2019-04-19 ENCOUNTER — Other Ambulatory Visit: Payer: Self-pay | Admitting: Family Medicine

## 2019-04-19 ENCOUNTER — Telehealth: Payer: Self-pay | Admitting: Family Medicine

## 2019-04-19 DIAGNOSIS — E1169 Type 2 diabetes mellitus with other specified complication: Secondary | ICD-10-CM

## 2019-04-19 DIAGNOSIS — E785 Hyperlipidemia, unspecified: Secondary | ICD-10-CM

## 2019-04-19 DIAGNOSIS — Z789 Other specified health status: Secondary | ICD-10-CM | POA: Insufficient documentation

## 2019-04-19 MED ORDER — FENOFIBRATE 145 MG PO TABS: 145.0000 mg | ORAL_TABLET | Freq: Every day | ORAL | 1 refills | Status: DC

## 2019-04-19 MED ORDER — PITAVASTATIN CALCIUM 4 MG PO TABS: 4.0000 mg | ORAL_TABLET | Freq: Every day | ORAL | 3 refills | Status: DC

## 2019-04-19 NOTE — Telephone Encounter (Signed)
Sent Livalo 4 mg and fenofibrate to pharmacy.

## 2019-04-19 NOTE — Telephone Encounter (Signed)
Stew aware that patient does not want to take.

## 2019-04-19 NOTE — Telephone Encounter (Signed)
Patient states she does not want to take livalo and fenofibrate it is like 300 at month and can not afford.

## 2019-04-19 NOTE — Telephone Encounter (Signed)
Stu at [facility] called to ask about the Cholesterol meds changed.  The copay is $582.00 for 90 day and $300 for 30 day.  Is there something else you can consider.  Patient cannot afford this.  Please call.

## 2019-05-15 ENCOUNTER — Encounter: Payer: Self-pay | Admitting: Family Medicine

## 2019-05-23 ENCOUNTER — Other Ambulatory Visit: Payer: Self-pay | Admitting: Family Medicine

## 2019-05-23 DIAGNOSIS — K219 Gastro-esophageal reflux disease without esophagitis: Secondary | ICD-10-CM

## 2019-06-06 ENCOUNTER — Other Ambulatory Visit: Payer: Self-pay | Admitting: Family Medicine

## 2019-06-21 ENCOUNTER — Other Ambulatory Visit: Payer: Self-pay | Admitting: Family Medicine

## 2019-06-21 DIAGNOSIS — K219 Gastro-esophageal reflux disease without esophagitis: Secondary | ICD-10-CM

## 2019-06-30 ENCOUNTER — Other Ambulatory Visit: Payer: Self-pay

## 2019-07-03 ENCOUNTER — Ambulatory Visit (INDEPENDENT_AMBULATORY_CARE_PROVIDER_SITE_OTHER): Payer: Medicare Other | Admitting: Family Medicine

## 2019-07-03 ENCOUNTER — Other Ambulatory Visit: Payer: Self-pay

## 2019-07-03 ENCOUNTER — Encounter: Payer: Self-pay | Admitting: Family Medicine

## 2019-07-03 VITALS — BP 124/70 | HR 67 | Temp 98.0°F | Resp 20 | Ht 70.0 in | Wt 239.0 lb

## 2019-07-03 DIAGNOSIS — E039 Hypothyroidism, unspecified: Secondary | ICD-10-CM | POA: Diagnosis not present

## 2019-07-03 DIAGNOSIS — E785 Hyperlipidemia, unspecified: Secondary | ICD-10-CM

## 2019-07-03 DIAGNOSIS — E1122 Type 2 diabetes mellitus with diabetic chronic kidney disease: Secondary | ICD-10-CM

## 2019-07-03 DIAGNOSIS — E1152 Type 2 diabetes mellitus with diabetic peripheral angiopathy with gangrene: Secondary | ICD-10-CM

## 2019-07-03 DIAGNOSIS — L0291 Cutaneous abscess, unspecified: Secondary | ICD-10-CM

## 2019-07-03 DIAGNOSIS — E1169 Type 2 diabetes mellitus with other specified complication: Secondary | ICD-10-CM | POA: Diagnosis not present

## 2019-07-03 DIAGNOSIS — Z7901 Long term (current) use of anticoagulants: Secondary | ICD-10-CM

## 2019-07-03 DIAGNOSIS — I129 Hypertensive chronic kidney disease with stage 1 through stage 4 chronic kidney disease, or unspecified chronic kidney disease: Secondary | ICD-10-CM

## 2019-07-03 DIAGNOSIS — Z86718 Personal history of other venous thrombosis and embolism: Secondary | ICD-10-CM | POA: Diagnosis not present

## 2019-07-03 LAB — POCT INR: INR: 2.2 (ref 2.0–3.0)

## 2019-07-03 LAB — COAGUCHEK XS/INR WAIVED
INR: 2.2 — ABNORMAL HIGH (ref 0.9–1.1)
Prothrombin Time: 26.3 s

## 2019-07-03 LAB — BAYER DCA HB A1C WAIVED: HB A1C (BAYER DCA - WAIVED): 6.5 % (ref <7.0)

## 2019-07-03 MED ORDER — DOXYCYCLINE HYCLATE 100 MG PO TABS: 100.0000 mg | ORAL_TABLET | Freq: Two times a day (BID) | ORAL | 0 refills | Status: AC

## 2019-07-03 NOTE — Patient Instructions (Signed)

## 2019-07-03 NOTE — Progress Notes (Signed)
Subjective:  Patient ID: [name], female    DOB: 1960-07-05, 59 y.o.   MRN: [id number]  Patient Care Team: Baruch Gouty, FNP as PCP - General (Family Medicine) [name], MD as Consulting Physician (Orthopedic Surgery)   Chief Complaint:  Medical Management of Chronic Issues (3 mo ), Hypertension, Hyperlipidemia, Diabetes, and Hypothyroidism   HPI: [name] is a 59 y.o. female presenting on 07/03/2019 for Medical Management of Chronic Issues (3 mo ), Hypertension, Hyperlipidemia, Diabetes, and Hypothyroidism   1. Type 2 diabetes mellitus with diabetic peripheral angiopathy and gangrene, without long-term current use of insulin (HCC) Patient compliant with medications.  Denies hyper or hypoglycemic episodes.  No polyuria, polydipsia, or polyphagia.  2. Acquired hypothyroidism Patient currently on levothyroxine 100 mcg.  Tolerating dose well.  No hyper or hypothyroid symptoms.  3. Hypertension associated with chronic kidney disease due to type 2 diabetes mellitus ([place]) Patient compliant with medications without associated side effects.  Denies elevated blood pressure readings at home.  No chest pain, headaches, palpitations, shortness of breath, dizziness, weakness, or confusion.  No leg swelling or changes in urinary output.  4. Hyperlipidemia associated with type 2 diabetes mellitus ([place]) Compliant with medications.  Does watch diet to an extent.  Daily but exercise has been limited due to pandemic and cold weather.  Patient doing very well with prosthetic.  This does not limit her activity.  She has been taking her medications as prescribed with continued elevated cholesterol levels.  Try to get Repatha approved by insurance but was declined.  5. History of DVT (deep vein thrombosis) 6. Long term current use of anticoagulant Patient on Coumadin therapy due to previous DVT and peripheral vascular disease.  Patient denies abnormal bleeding or  bruising.  States she takes medication as prescribed.  7. Abscess Patient reports abscess to her right buttock.  States the area has been draining purulent drainage over the last 2 days.  Is tender to touch and slightly swollen.  No fever, chills, weakness, or confusion.     Relevant past medical, surgical, family, and social history reviewed and updated as indicated.  Allergies and medications reviewed and updated. Date reviewed: Chart in Epic.   Past Medical History:  Diagnosis Date  . COPD (chronic obstructive pulmonary disease) ([place])   . Depression   . DM (diabetes mellitus) ([place])   . DVT (deep venous thrombosis) (HCC)    x5  . Family history of colon cancer   . GERD (gastroesophageal reflux disease)   . Hyperlipemia   . Hypertension   . Hypothyroid   . Obesity   . Osteomyelitis of great toe of right foot ([place]) 07/04/2018  . PVD (peripheral vascular disease) ([place])   . Unilateral complete BKA, right, initial encounter ([place]) 07/11/2018  . Unilateral complete BKA, right, subsequent encounter ([place])   . Uterine cancer ([place])   . UTI (urinary tract infection)     Past Surgical History:  Procedure Laterality Date  . ABDOMINAL HYSTERECTOMY    . AMPUTATION Right 07/08/2018   Procedure: RIGHT BELOW KNEE AMPUTATION;  Surgeon: [name], MD;  Location: [facility];  Service: Orthopedics;  Laterality: Right;  . CESAREAN SECTION    . FEMORAL BYPASS     x 5  . LUMBAR DISC SURGERY     L4-L5  . TOE AMPUTATION     right  . TONSILLECTOMY      Social History   Socioeconomic History  . Marital  status: Married    Spouse name: Kasandra Knudsen  . Number of children: 1  . Years of education: 43  . Highest education level: Master's degree (e.g., MA, MS, MEng, MEd, MSW, MBA)  Occupational History  . Occupation: Product manager: [employer]  . Occupation: retired  Tobacco Use  . Smoking status: Current Every Day Smoker    Packs/day: 0.50    Years: 38.00    Pack years: 19.00     Types: Cigarettes  . Smokeless tobacco: Never Used  Substance and Sexual Activity  . Alcohol use: No    Alcohol/week: 0.0 standard drinks  . Drug use: Yes    Frequency: 4.0 times per week    Types: Marijuana  . Sexual activity: Not Currently    Birth control/protection: Surgical  Other Topics Concern  . Not on file  Social History Narrative  . Not on file   Social Determinants of Health   Financial Resource Strain: Low Risk   . Difficulty of Paying Living Expenses: Not hard at all  Food Insecurity: No Food Insecurity  . Worried About Charity fundraiser in the Last Year: Never true  . Ran Out of Food in the Last Year: Never true  Transportation Needs: No Transportation Needs  . Lack of Transportation (Medical): No  . Lack of Transportation (Non-Medical): No  Physical Activity: Inactive  . Days of Exercise per Week: 0 days  . Minutes of Exercise per Session: 0 min  Stress: No Stress Concern Present  . Feeling of Stress : Not at all  Social Connections: Somewhat Isolated  . Frequency of Communication with Friends and Family: More than three times a week  . Frequency of Social Gatherings with Friends and Family: More than three times a week  . Attends Religious Services: Never  . Active Member of Clubs or Organizations: No  . Attends Archivist Meetings: Never  . Marital Status: Married  Human resources officer Violence: Not At Risk  . Fear of Current or Ex-Partner: No  . Emotionally Abused: No  . Physically Abused: No  . Sexually Abused: No    Outpatient Encounter Medications as of 07/03/2019  Medication Sig  . acetaminophen (TYLENOL) 325 MG tablet Take 1-2 tablets (325-650 mg total) by mouth every 4 (four) hours as needed for mild pain.  Marland Kitchen albuterol (PROVENTIL HFA;VENTOLIN HFA) 108 (90 Base) MCG/ACT inhaler Inhale 2 puffs into the lungs every 6 (six) hours as needed for wheezing or shortness of breath.  Marland Kitchen aspirin EC 81 MG EC tablet Take 1 tablet (81 mg total) by mouth  daily.  . Cholecalciferol (VITAMIN D3) 5000 units TABS Take 1 tablet by mouth daily.  Marland Kitchen escitalopram (LEXAPRO) 10 MG tablet Take 1 tablet (10 mg total) by mouth daily.  . fenofibrate (TRICOR) 145 MG tablet Take 1 tablet (145 mg total) by mouth daily.  . furosemide (LASIX) 20 MG tablet TAKE 1 TABLET DAILY  . levothyroxine (SYNTHROID) 100 MCG tablet Take 1 tablet (100 mcg total) by mouth daily before breakfast.  . losartan (COZAAR) 50 MG tablet TAKE 1 TABLET BY MOUTH DAILY.  . metFORMIN (GLUCOPHAGE) 1000 MG tablet Take 1 tablet (1,000 mg total) by mouth 2 (two) times daily with a meal.  . ONE TOUCH ULTRA TEST test strip TEST BLOOD SUGARS TWICE A DAY  . Potassium 99 MG TABS Take 1 tablet by mouth daily.  . pravastatin (PRAVACHOL) 20 MG tablet Take 1 tablet (20 mg total) by mouth daily.  Marland Kitchen  tretinoin (RETIN-A) 0.05 % cream Apply topically at bedtime.  . triamcinolone cream (KENALOG) 0.1 % Apply topically 2 (two) times daily.  Marland Kitchen warfarin (COUMADIN) 5 MG tablet TAKE AS DIRECTED UP TO 1 & 1/2 TABLETS A DAY  . [DISCONTINUED] clopidogrel (PLAVIX) 75 MG tablet TAKE 1 TABLET BY MOUTH DAILY.  . [DISCONTINUED] glimepiride (AMARYL) 2 MG tablet Take 1 tablet (2 mg total) by mouth daily before breakfast.  . [DISCONTINUED] pantoprazole (PROTONIX) 40 MG tablet TAKE 1 TABLET BY MOUTH DAILY.  Marland Kitchen doxycycline (VIBRA-TABS) 100 MG tablet Take 1 tablet (100 mg total) by mouth 2 (two) times daily for 10 days. 1 po bid  . [DISCONTINUED] Evolocumab (REPATHA) 140 MG/ML SOSY Inject 140 mg into the skin every 14 (fourteen) days.  . [DISCONTINUED] Pitavastatin Calcium 4 MG TABS Take 1 tablet (4 mg total) by mouth daily for 90 doses.   No facility-administered encounter medications on file as of 07/03/2019.    Allergies  Allergen Reactions  . Aleve [Naproxen Sodium] Hives and Itching  . Cortisone Swelling    Internal organs  . Prednisone Swelling    Makes internal organs swell  . Vancomycin Itching    Review of  Systems  Constitutional: Negative for activity change, appetite change, chills, diaphoresis, fatigue, fever and unexpected weight change.  HENT: Negative.   Eyes: Negative.  Negative for photophobia and visual disturbance.  Respiratory: Negative for cough, chest tightness and shortness of breath.   Cardiovascular: Negative for chest pain, palpitations and leg swelling.  Gastrointestinal: Negative for abdominal pain, blood in stool, constipation, diarrhea, nausea and vomiting.  Endocrine: Negative.  Negative for polydipsia, polyphagia and polyuria.  Genitourinary: Negative for decreased urine volume, difficulty urinating, dysuria, frequency and urgency.  Musculoskeletal: Negative for arthralgias and myalgias.  Skin: Negative.   Allergic/Immunologic: Negative.   Neurological: Negative for dizziness, tremors, seizures, syncope, facial asymmetry, speech difficulty, weakness, light-headedness, numbness and headaches.  Hematological: Negative.   Psychiatric/Behavioral: Negative for confusion, hallucinations, sleep disturbance and suicidal ideas.  All other systems reviewed and are negative.       Objective:  BP 124/70   Pulse 67   Temp 98 F (36.7 C)   Resp 20   Ht 5' 10"  (1.778 m)   Wt 239 lb (108.4 kg)   SpO2 98%   BMI 34.29 kg/m    Wt Readings from Last 3 Encounters:  07/03/19 239 lb (108.4 kg)  03/29/19 241 lb (109.3 kg)  02/06/19 239 lb (108.4 kg)    Physical Exam Vitals and nursing note reviewed.  Constitutional:      General: She is not in acute distress.    Appearance: Normal appearance. She is well-developed and well-groomed. She is obese. She is not ill-appearing, toxic-appearing or diaphoretic.  HENT:     Head: Normocephalic and atraumatic.     Jaw: There is normal jaw occlusion.     Right Ear: Hearing normal.     Left Ear: Hearing normal.     Nose: Nose normal.     Mouth/Throat:     Lips: Pink.     Mouth: Mucous membranes are moist.     Pharynx: Oropharynx is  clear. Uvula midline.  Eyes:     General: Lids are normal.     Extraocular Movements: Extraocular movements intact.     Conjunctiva/sclera: Conjunctivae normal.     Pupils: Pupils are equal, round, and reactive to light.  Neck:     Thyroid: No thyroid mass, thyromegaly or thyroid tenderness.  Vascular: No carotid bruit or JVD.     Trachea: Trachea and phonation normal.  Cardiovascular:     Rate and Rhythm: Normal rate and regular rhythm.     Chest Wall: PMI is not displaced.     Pulses: Normal pulses.     Heart sounds: Normal heart sounds. No murmur. No friction rub. No gallop.   Pulmonary:     Effort: Pulmonary effort is normal. No respiratory distress.     Breath sounds: Normal breath sounds. No wheezing.  Abdominal:     General: Bowel sounds are normal. There is no distension or abdominal bruit.     Palpations: Abdomen is soft. There is no hepatomegaly or splenomegaly.     Tenderness: There is no abdominal tenderness. There is no right CVA tenderness or left CVA tenderness.     Hernia: No hernia is present.  Musculoskeletal:        General: Normal range of motion.     Cervical back: Normal range of motion and neck supple.     Right lower leg: No edema.     Left lower leg: No edema.  Lymphadenopathy:     Cervical: No cervical adenopathy.  Skin:    General: Skin is warm and dry.     Capillary Refill: Capillary refill takes less than 2 seconds.     Coloration: Skin is not cyanotic, jaundiced or pale.     Findings: Abscess (Right buttock) present. No rash.  Neurological:     General: No focal deficit present.     Mental Status: She is alert and oriented to person, place, and time.     Cranial Nerves: Cranial nerves are intact. No cranial nerve deficit.     Sensory: Sensation is intact. No sensory deficit.     Motor: Motor function is intact. No weakness.     Coordination: Coordination is intact. Coordination normal.     Gait: Gait is intact. Gait (Has adjusted very well to  the right lower leg prosthetic device, no longer requiring cane) normal.     Deep Tendon Reflexes: Reflexes are normal and symmetric. Reflexes normal.  Psychiatric:        Attention and Perception: Attention and perception normal.        Mood and Affect: Mood and affect normal.        Speech: Speech normal.        Behavior: Behavior normal. Behavior is cooperative.        Thought Content: Thought content normal.        Cognition and Memory: Cognition and memory normal.        Judgment: Judgment normal.     Results for orders placed or performed in visit on 07/03/19  CBC with Differential/Platelet  Result Value Ref Range   WBC 12.9 (H) 3.4 - 10.8 x10E3/uL   RBC 4.95 3.77 - 5.28 x10E6/uL   Hemoglobin 14.4 11.1 - 15.9 g/dL   Hematocrit 42.0 34.0 - 46.6 %   MCV 85 79 - 97 fL   MCH 29.1 26.6 - 33.0 pg   MCHC 34.3 31.5 - 35.7 g/dL   RDW 14.3 11.7 - 15.4 %   Platelets 300 150 - 450 x10E3/uL   Neutrophils 54 Not Estab. %   Lymphs 36 Not Estab. %   Monocytes 8 Not Estab. %   Eos 1 Not Estab. %   Basos 1 Not Estab. %   Neutrophils Absolute 6.9 1.4 - 7.0 x10E3/uL   Lymphocytes Absolute 4.6 (H) 0.7 -  3.1 x10E3/uL   Monocytes Absolute 1.0 (H) 0.1 - 0.9 x10E3/uL   EOS (ABSOLUTE) 0.2 0.0 - 0.4 x10E3/uL   Basophils Absolute 0.1 0.0 - 0.2 x10E3/uL   Immature Granulocytes 0 Not Estab. %   Immature Grans (Abs) 0.0 0.0 - 0.1 x10E3/uL  CMP14+EGFR  Result Value Ref Range   Glucose 98 65 - 99 mg/dL   BUN 15 6 - 24 mg/dL   Creatinine, Ser 0.84 0.57 - 1.00 mg/dL   GFR calc non Af Amer 78 >59 mL/min/1.73   GFR calc Af Amer 90 >59 mL/min/1.73   BUN/Creatinine Ratio 18 9 - 23   Sodium 138 134 - 144 mmol/L   Potassium 4.6 3.5 - 5.2 mmol/L   Chloride 104 96 - 106 mmol/L   CO2 20 20 - 29 mmol/L   Calcium 9.8 8.7 - 10.2 mg/dL   Total Protein 7.2 6.0 - 8.5 g/dL   Albumin 4.0 3.8 - 4.9 g/dL   Globulin, Total 3.2 1.5 - 4.5 g/dL   Albumin/Globulin Ratio 1.3 1.2 - 2.2   Bilirubin Total <0.2 0.0 -  1.2 mg/dL   Alkaline Phosphatase 43 39 - 117 IU/L   AST 23 0 - 40 IU/L   ALT 17 0 - 32 IU/L  Lipid panel  Result Value Ref Range   Cholesterol, Total 184 100 - 199 mg/dL   Triglycerides 196 (H) 0 - 149 mg/dL   HDL 34 (L) >39 mg/dL   VLDL Cholesterol Cal 35 5 - 40 mg/dL   LDL Chol Calc (NIH) 115 (H) 0 - 99 mg/dL   Chol/HDL Ratio 5.4 (H) 0.0 - 4.4 ratio  Thyroid Panel With TSH  Result Value Ref Range   TSH 2.750 0.450 - 4.500 uIU/mL   T4, Total 8.7 4.5 - 12.0 ug/dL   T3 Uptake Ratio 26 24 - 39 %   Free Thyroxine Index 2.3 1.2 - 4.9  Bayer DCA Hb A1c Waived  Result Value Ref Range   HB A1C (BAYER DCA - WAIVED) 6.5 <7.0 %  CoaguChek XS/INR Waived  Result Value Ref Range   INR 2.2 (H) 0.9 - 1.1   Prothrombin Time 26.3 sec  POCT INR  Result Value Ref Range   INR 2.2 2.0 - 3.0       Pertinent labs & imaging results that were available during my care of the patient were reviewed by me and considered in my medical decision making.  Assessment & Plan:  Brandyce was seen today for medical management of chronic issues, hypertension, hyperlipidemia, diabetes and hypothyroidism.  Diagnoses and all orders for this visit:  Type 2 diabetes mellitus with diabetic peripheral angiopathy and gangrene, without long-term current use of insulin (HCC) A1c 6.5 today.  Patient encouraged to keep up the great work.  We will continue current medication regimen.  Diet and exercise encouraged. -     CBC with Differential/Platelet -     Bayer DCA Hb A1c Waived  Acquired hypothyroidism Labs pending.  Thyroid has been well controlled in the past.  Will adjust repletion therapy if warranted. -     CBC with Differential/Platelet -     Thyroid Panel With TSH  Hypertension associated with chronic kidney disease due to type 2 diabetes mellitus ([place]) Blood pressure well controlled today.  Diet and exercise encouraged.  We will continue current medications.  Patient aware to report any persistent high or low  readings. -     CBC with Differential/Platelet -     CMP14+EGFR  Hyperlipidemia associated with type 2 diabetes mellitus ([place]) Cholesterol has been uncontrolled in the past.  Recent initiation of fish oil.  Labs pending.  Will adjust therapy if warranted.  Diet and exercise discussed in detail. -     CBC with Differential/Platelet -     Lipid panel  History of DVT (deep vein thrombosis) Long term current use of anticoagulant INR 2.2 today.  No changes in therapy.  Will be initiated on antibiotics due to abscess.  Patient aware she will need to have more frequent INR checks during antibiotic therapy.  Patient agrees to this. -     CBC with Differential/Platelet -     CoaguChek XS/INR Waived -     POCT INR -     CoaguChek XS/INR Waived; Future  Abscess Abscess to right buttock.  Will initiate below.  Patient aware to report any new or worsening symptoms.  Patient aware she will need more frequent INR checks during antibiotic therapy. -     doxycycline (VIBRA-TABS) 100 MG tablet; Take 1 tablet (100 mg total) by mouth 2 (two) times daily for 10 days. 1 po bid     Continue all other maintenance medications.  Follow up plan: Return in about 10 days (around 07/13/2019), or if symptoms worsen or fail to improve, for INR.  Continue healthy lifestyle choices, including diet (rich in fruits, vegetables, and lean proteins, and low in salt and simple carbohydrates) and exercise (at least 30 minutes of moderate physical activity daily).  Educational handout given for diabetes  The above assessment and management plan was discussed with the patient. The patient verbalized understanding of and has agreed to the management plan. Patient is aware to call the clinic if they develop any new symptoms or if symptoms persist or worsen. Patient is aware when to return to the clinic for a follow-up visit. Patient educated on when it is appropriate to go to the emergency department.   Monia Pouch,  FNP-C [facility] [phone]

## 2019-07-04 ENCOUNTER — Other Ambulatory Visit: Payer: Self-pay | Admitting: Family Medicine

## 2019-07-04 ENCOUNTER — Other Ambulatory Visit: Payer: Self-pay | Admitting: Pharmacist Clinician (PhC)/ Clinical Pharmacy Specialist

## 2019-07-04 DIAGNOSIS — K219 Gastro-esophageal reflux disease without esophagitis: Secondary | ICD-10-CM

## 2019-07-04 LAB — CMP14+EGFR
ALT: 17 IU/L (ref 0–32)
AST: 23 IU/L (ref 0–40)
Albumin/Globulin Ratio: 1.3 (ref 1.2–2.2)
Albumin: 4 g/dL (ref 3.8–4.9)
Alkaline Phosphatase: 43 IU/L (ref 39–117)
BUN/Creatinine Ratio: 18 (ref 9–23)
BUN: 15 mg/dL (ref 6–24)
Bilirubin Total: <0.2 mg/dL (ref 0.0–1.2)
CO2: 20 mmol/L (ref 20–29)
Calcium: 9.8 mg/dL (ref 8.7–10.2)
Chloride: 104 mmol/L (ref 96–106)
Creatinine, Ser: 0.84 mg/dL (ref 0.57–1.00)
GFR calc Af Amer: 90 mL/min/{1.73_m2} (ref >59)
GFR calc non Af Amer: 78 mL/min/{1.73_m2} (ref >59)
Globulin, Total: 3.2 g/dL (ref 1.5–4.5)
Glucose: 98 mg/dL (ref 65–99)
Potassium: 4.6 mmol/L (ref 3.5–5.2)
Sodium: 138 mmol/L (ref 134–144)
Total Protein: 7.2 g/dL (ref 6.0–8.5)

## 2019-07-04 LAB — CBC WITH DIFFERENTIAL/PLATELET
Basophils Absolute: 0.1 10*3/uL (ref 0.0–0.2)
Basos: 1 %
EOS (ABSOLUTE): 0.2 10*3/uL (ref 0.0–0.4)
Eos: 1 %
Hematocrit: 42 % (ref 34.0–46.6)
Hemoglobin: 14.4 g/dL (ref 11.1–15.9)
Immature Grans (Abs): 0 10*3/uL (ref 0.0–0.1)
Immature Granulocytes: 0 %
Lymphocytes Absolute: 4.6 10*3/uL — ABNORMAL HIGH (ref 0.7–3.1)
Lymphs: 36 %
MCH: 29.1 pg (ref 26.6–33.0)
MCHC: 34.3 g/dL (ref 31.5–35.7)
MCV: 85 fL (ref 79–97)
Monocytes Absolute: 1 10*3/uL — ABNORMAL HIGH (ref 0.1–0.9)
Monocytes: 8 %
Neutrophils Absolute: 6.9 10*3/uL (ref 1.4–7.0)
Neutrophils: 54 %
Platelets: 300 10*3/uL (ref 150–450)
RBC: 4.95 x10E6/uL (ref 3.77–5.28)
RDW: 14.3 % (ref 11.7–15.4)
WBC: 12.9 10*3/uL — ABNORMAL HIGH (ref 3.4–10.8)

## 2019-07-04 LAB — THYROID PANEL WITH TSH
Free Thyroxine Index: 2.3 (ref 1.2–4.9)
T3 Uptake Ratio: 26 % (ref 24–39)
T4, Total: 8.7 ug/dL (ref 4.5–12.0)
TSH: 2.75 u[IU]/mL (ref 0.450–4.500)

## 2019-07-04 LAB — LIPID PANEL
Chol/HDL Ratio: 5.4 ratio — ABNORMAL HIGH (ref 0.0–4.4)
Cholesterol, Total: 184 mg/dL (ref 100–199)
HDL: 34 mg/dL — ABNORMAL LOW (ref >39)
LDL Chol Calc (NIH): 115 mg/dL — ABNORMAL HIGH (ref 0–99)
Triglycerides: 196 mg/dL — ABNORMAL HIGH (ref 0–149)
VLDL Cholesterol Cal: 35 mg/dL (ref 5–40)

## 2019-07-18 ENCOUNTER — Other Ambulatory Visit: Payer: Self-pay | Admitting: Family Medicine

## 2019-08-02 ENCOUNTER — Other Ambulatory Visit: Payer: Self-pay | Admitting: Family Medicine

## 2019-08-15 ENCOUNTER — Other Ambulatory Visit: Payer: Self-pay

## 2019-08-15 ENCOUNTER — Ambulatory Visit: Payer: Medicare Other | Admitting: Family Medicine

## 2019-08-15 ENCOUNTER — Other Ambulatory Visit: Payer: Self-pay | Admitting: Family Medicine

## 2019-08-15 DIAGNOSIS — K219 Gastro-esophageal reflux disease without esophagitis: Secondary | ICD-10-CM

## 2019-08-16 ENCOUNTER — Other Ambulatory Visit: Payer: Self-pay

## 2019-08-16 ENCOUNTER — Ambulatory Visit (INDEPENDENT_AMBULATORY_CARE_PROVIDER_SITE_OTHER): Payer: Medicare Other | Admitting: Family Medicine

## 2019-08-16 ENCOUNTER — Encounter: Payer: Self-pay | Admitting: Family Medicine

## 2019-08-16 VITALS — BP 136/77 | HR 82 | Temp 96.9°F | Ht 70.0 in | Wt 242.0 lb

## 2019-08-16 DIAGNOSIS — R399 Unspecified symptoms and signs involving the genitourinary system: Secondary | ICD-10-CM | POA: Diagnosis not present

## 2019-08-16 DIAGNOSIS — Z7901 Long term (current) use of anticoagulants: Secondary | ICD-10-CM

## 2019-08-16 DIAGNOSIS — R310 Gross hematuria: Secondary | ICD-10-CM | POA: Diagnosis not present

## 2019-08-16 DIAGNOSIS — K635 Polyp of colon: Secondary | ICD-10-CM | POA: Diagnosis not present

## 2019-08-16 LAB — MICROSCOPIC EXAMINATION
Epithelial Cells (non renal): >10 /hpf — AB (ref 0–10)
RBC, Urine: NONE SEEN /hpf (ref 0–2)
Renal Epithel, UA: NONE SEEN /hpf
WBC, UA: NONE SEEN /hpf (ref 0–5)

## 2019-08-16 LAB — URINALYSIS, COMPLETE
Bilirubin, UA: NEGATIVE
Glucose, UA: NEGATIVE
Ketones, UA: NEGATIVE
Leukocytes,UA: NEGATIVE
Nitrite, UA: NEGATIVE
RBC, UA: NEGATIVE
Specific Gravity, UA: 1.02 (ref 1.005–1.030)
Urobilinogen, Ur: 0.2 mg/dL (ref 0.2–1.0)
pH, UA: 7 (ref 5.0–7.5)

## 2019-08-16 NOTE — Progress Notes (Signed)
Subjective: CC: ?UTI PCP: Baruch Gouty, FNP ZD:[id number] [name] is a 59 y.o. female presenting to clinic today for:  1. Urinary symptoms Patient reports a 1 day history of hematuria.  She notes yesterday she saw gross amounts of blood in her toilet.  When she wiped she found a couple of droplets on the paper.  Today she is not having any blood.  She denies any dysuria, urinary frequency or urgency.  She is on an anticoagulant.  She has been seen by Dr. Jeffie Pollock in [place] previously and noted to have some cyst on her bladder.  It was recommended that she have this followed up as a hospital procedure but unfortunately she was in between insurance and did not have the capability of getting that done at that time.  She would like to be reevaluated.  She is an every day smoker and has a history of uterine cancer.  She was also noted to have colonic polyps in 2014.  She was seeing Dr. Sharlett Iles at that time.  She would like to go ahead and proceed with a repeat colonoscopy as well as she is overdue.  Denies any rectal bleeding that she knows of, early satiety, night sweats, abdominal pain, nausea, vomiting.  Medical history is significant for total hysterectomy    ROS: Per HPI  Allergies  Allergen Reactions  . Aleve [Naproxen Sodium] Hives and Itching  . Cortisone Swelling    Internal organs  . Prednisone Swelling    Makes internal organs swell  . Vancomycin Itching   Past Medical History:  Diagnosis Date  . COPD (chronic obstructive pulmonary disease) ([place])   . Depression   . DM (diabetes mellitus) ([place])   . DVT (deep venous thrombosis) ([facility])    x5  . Family history of colon cancer   . GERD (gastroesophageal reflux disease)   . Hyperlipemia   . Hypertension   . Hypothyroid   . Obesity   . Osteomyelitis of great toe of right foot ([place]) 07/04/2018  . PVD (peripheral vascular disease) ([place])   . Unilateral complete BKA, right, initial encounter ([place]) 07/11/2018  .  Unilateral complete BKA, right, subsequent encounter ([place])   . Uterine cancer ([place])   . UTI (urinary tract infection)     Current Outpatient Medications:  .  acetaminophen (TYLENOL) 325 MG tablet, Take 1-2 tablets (325-650 mg total) by mouth every 4 (four) hours as needed for mild pain., Disp: , Rfl:  .  albuterol (PROVENTIL HFA;VENTOLIN HFA) 108 (90 Base) MCG/ACT inhaler, Inhale 2 puffs into the lungs every 6 (six) hours as needed for wheezing or shortness of breath., Disp: 1 Inhaler, Rfl: 11 .  aspirin EC 81 MG EC tablet, Take 1 tablet (81 mg total) by mouth daily., Disp: , Rfl:  .  Cholecalciferol (VITAMIN D3) 5000 units TABS, Take 1 tablet by mouth daily., Disp: , Rfl:  .  clopidogrel (PLAVIX) 75 MG tablet, TAKE 1 TABLET BY MOUTH DAILY., Disp: 30 tablet, Rfl: 3 .  escitalopram (LEXAPRO) 10 MG tablet, Take 1 tablet (10 mg total) by mouth daily., Disp: 30 tablet, Rfl: 5 .  fenofibrate (TRICOR) 145 MG tablet, Take 1 tablet (145 mg total) by mouth daily., Disp: 90 tablet, Rfl: 1 .  furosemide (LASIX) 20 MG tablet, TAKE 1 TABLET DAILY, Disp: 90 tablet, Rfl: 1 .  glimepiride (AMARYL) 2 MG tablet, TAKE 1 TABLET ONCE DAILY BEFORE BREAKFAST, Disp: 30 tablet, Rfl: 2 .  levothyroxine (SYNTHROID) 100 MCG tablet, Take 1  tablet (100 mcg total) by mouth daily before breakfast., Disp: 90 tablet, Rfl: 3 .  losartan (COZAAR) 50 MG tablet, TAKE 1 TABLET BY MOUTH DAILY., Disp: 30 tablet, Rfl: 0 .  metFORMIN (GLUCOPHAGE) 1000 MG tablet, Take 1 tablet (1,000 mg total) by mouth 2 (two) times daily with a meal., Disp: 180 tablet, Rfl: 3 .  ONE TOUCH ULTRA TEST test strip, TEST BLOOD SUGARS TWICE A DAY, Disp: 50 each, Rfl: 2 .  pantoprazole (PROTONIX) 40 MG tablet, TAKE 1 TABLET BY MOUTH DAILY., Disp: 30 tablet, Rfl: 0 .  Potassium 99 MG TABS, Take 1 tablet by mouth daily., Disp: , Rfl:  .  pravastatin (PRAVACHOL) 20 MG tablet, Take 1 tablet (20 mg total) by mouth daily., Disp: 90 tablet, Rfl: 3 .  tretinoin  (RETIN-A) 0.05 % cream, Apply topically at bedtime., Disp: 45 g, Rfl: 3 .  triamcinolone cream (KENALOG) 0.1 %, Apply topically 2 (two) times daily., Disp: 30 g, Rfl: 0 .  warfarin (COUMADIN) 5 MG tablet, TAKE AS DIRECTED UP TO 1 & 1/2 TABLETS A DAY, Disp: 135 tablet, Rfl: 0 Social History   Socioeconomic History  . Marital status: Married    Spouse name: Kasandra Knudsen  . Number of children: 1  . Years of education: 60  . Highest education level: Master's degree (e.g., MA, MS, MEng, MEd, MSW, MBA)  Occupational History  . Occupation: Product manager: [employer]  . Occupation: retired  Tobacco Use  . Smoking status: Current Every Day Smoker    Packs/day: 0.50    Years: 38.00    Pack years: 19.00    Types: Cigarettes  . Smokeless tobacco: Never Used  Substance and Sexual Activity  . Alcohol use: No    Alcohol/week: 0.0 standard drinks  . Drug use: Yes    Frequency: 4.0 times per week    Types: Marijuana  . Sexual activity: Not Currently    Birth control/protection: Surgical  Other Topics Concern  . Not on file  Social History Narrative  . Not on file   Social Determinants of Health   Financial Resource Strain: Low Risk   . Difficulty of Paying Living Expenses: Not hard at all  Food Insecurity: No Food Insecurity  . Worried About Charity fundraiser in the Last Year: Never true  . Ran Out of Food in the Last Year: Never true  Transportation Needs: No Transportation Needs  . Lack of Transportation (Medical): No  . Lack of Transportation (Non-Medical): No  Physical Activity: Inactive  . Days of Exercise per Week: 0 days  . Minutes of Exercise per Session: 0 min  Stress: No Stress Concern Present  . Feeling of Stress : Not at all  Social Connections: Somewhat Isolated  . Frequency of Communication with Friends and Family: More than three times a week  . Frequency of Social Gatherings with Friends and Family: More than three times a week  . Attends Religious  Services: Never  . Active Member of Clubs or Organizations: No  . Attends Archivist Meetings: Never  . Marital Status: Married  Human resources officer Violence: Not At Risk  . Fear of Current or Ex-Partner: No  . Emotionally Abused: No  . Physically Abused: No  . Sexually Abused: No   Family History  Problem Relation Age of Onset  . Colon cancer Brother 25  . Hypertension Mother   . Hypothyroidism Mother   . Heart disease Father   . Diabetes Father   .  Hyperlipidemia Father   . Clotting disorder Brother   . Diabetes Brother   . Heart disease Brother     Objective: Office vital signs reviewed. BP 136/77   Pulse 82   Temp (!) 96.9 F (36.1 C) (Temporal)   Ht 5\' 10"  (1.778 m)   Wt 242 lb (109.8 kg)   BMI 34.72 kg/m   Physical Examination:  General: Awake, alert, chronically ill appearing, No acute distress HEENT: Normal, sclera white, no conjunctival pallor GI: Obese, soft, non-tender, non-distended, bowel sounds present x4, no hepatomegaly, no splenomegaly, no masses; diastases recti noted  Assessment/ Plan: 59 y.o. female   1. Gross hematuria Urinalysis without evidence of RBCs.  Ongoing symptoms for culture just to be sure that there is no bacterial growth.  I placed a referral back to her urologist for further evaluation.  I suspect she will need cystoscopy. - Urinalysis, Complete - Ambulatory referral to Urology - Urine Culture  2. On anticoagulant therapy  3. Polyp of colon, unspecified part of colon, unspecified type Referral placed back to Dr. Sharlett Iles for colonoscopy. - Ambulatory referral to Gastroenterology   No orders of the defined types were placed in this encounter.  No orders of the defined types were placed in this encounter.    Janora Norlander, DO [place] [phone]

## 2019-08-16 NOTE — Patient Instructions (Signed)
Hematuria, Adult Hematuria is blood in the urine. Blood may be visible in the urine, or it may be identified with a test. This condition can be caused by infections of the bladder, urethra, kidney, or prostate. Other possible causes include:  Kidney stones.  Cancer of the urinary tract.  Too much calcium in the urine.  Conditions that are passed from parent to child (inherited conditions).  Exercise that requires a lot of energy. Infections can usually be treated with medicine, and a kidney stone usually will pass through your urine. If neither of these is the cause of your hematuria, more tests may be needed to identify the cause of your symptoms. It is very important to tell your health care provider about any blood in your urine, even if it is painless or the blood stops without treatment. Blood in the urine, when it happens and then stops and then happens again, can be a symptom of a very serious condition, including cancer. There is no pain in the initial stages of many urinary cancers. Follow these instructions at home: Medicines  Take over-the-counter and prescription medicines only as told by your health care provider.  If you were prescribed an antibiotic medicine, take it as told by your health care provider. Do not stop taking the antibiotic even if you start to feel better. Eating and drinking  Drink enough fluid to keep your urine clear or pale yellow. It is recommended that you drink 3-4 quarts (2.8-3.8 L) a day. If you have been diagnosed with an infection, it is recommended that you drink cranberry juice in addition to large amounts of water.  Avoid caffeine, tea, and carbonated beverages. These tend to irritate the bladder.  Avoid alcohol because it may irritate the prostate (men). General instructions  If you have been diagnosed with a kidney stone, follow your health care provider's instructions about straining your urine to catch the stone.  Empty your bladder  often. Avoid holding urine for long periods of time.  If you are female: ? After a bowel movement, wipe from front to back and use each piece of toilet paper only once. ? Empty your bladder before and after sex.  Pay attention to any changes in your symptoms. Tell your health care provider about any changes or any new symptoms.  It is your responsibility to get your test results. Ask your health care provider, or the department performing the test, when your results will be ready.  Keep all follow-up visits as told by your health care provider. This is important. Contact a health care provider if:  You develop back pain.  You have a fever.  You have nausea or vomiting.  Your symptoms do not improve after 3 days.  Your symptoms get worse. Get help right away if:  You develop severe vomiting and are unable take medicine without vomiting.  You develop severe pain in your back or abdomen even though you are taking medicine.  You pass a large amount of blood in your urine.  You pass blood clots in your urine.  You feel very weak or like you might faint.  You faint. Summary  Hematuria is blood in the urine. It has many possible causes.  It is very important that you tell your health care provider about any blood in your urine, even if it is painless or the blood stops without treatment.  Take over-the-counter and prescription medicines only as told by your health care provider.  Drink enough fluid to keep   your urine clear or pale yellow. This information is not intended to replace advice given to you by your health care provider. Make sure you discuss any questions you have with your health care provider. Document Revised: 10/19/2018 Document Reviewed: 06/27/2016 Elsevier Patient Education  2020 Elsevier Inc.  

## 2019-08-17 LAB — URINE CULTURE

## 2019-09-27 ENCOUNTER — Other Ambulatory Visit: Payer: Self-pay | Admitting: *Deleted

## 2019-09-27 DIAGNOSIS — E1169 Type 2 diabetes mellitus with other specified complication: Secondary | ICD-10-CM

## 2019-09-27 DIAGNOSIS — Z789 Other specified health status: Secondary | ICD-10-CM

## 2019-09-27 DIAGNOSIS — E785 Hyperlipidemia, unspecified: Secondary | ICD-10-CM

## 2019-09-27 MED ORDER — FENOFIBRATE 145 MG PO TABS: 145.0000 mg | ORAL_TABLET | Freq: Every day | ORAL | 0 refills | Status: DC

## 2019-09-27 MED ORDER — FUROSEMIDE 20 MG PO TABS: 20.0000 mg | ORAL_TABLET | Freq: Every day | ORAL | 0 refills | Status: DC

## 2019-09-27 MED ORDER — LOSARTAN POTASSIUM 50 MG PO TABS: 50.0000 mg | ORAL_TABLET | Freq: Every day | ORAL | 0 refills | Status: DC

## 2019-09-28 ENCOUNTER — Other Ambulatory Visit: Payer: Self-pay | Admitting: *Deleted

## 2019-09-28 DIAGNOSIS — K219 Gastro-esophageal reflux disease without esophagitis: Secondary | ICD-10-CM

## 2019-09-28 MED ORDER — PANTOPRAZOLE SODIUM 40 MG PO TBEC: 40.0000 mg | DELAYED_RELEASE_TABLET | Freq: Every day | ORAL | 0 refills | Status: DC

## 2019-10-03 ENCOUNTER — Ambulatory Visit: Payer: Medicare Other | Admitting: Family Medicine

## 2019-10-04 ENCOUNTER — Ambulatory Visit: Payer: Medicare Other | Admitting: Family Medicine

## 2019-10-06 ENCOUNTER — Ambulatory Visit: Payer: Medicare Other | Admitting: Urology

## 2019-10-06 NOTE — Progress Notes (Deleted)
Subjective: 1. Gross hematuria   2. Personal history of malignant neoplasm of other parts of uterus   3. Urethral caruncle     [name] is a former patient who is sent back in consultation by Dr. Lajuana Ripple for gross hematuria in March.  I last saw in 2013 for hematuria. She was found what appeared to be a thrombosed urethral polyp and f/u for biopsy was recommended but she didn't follow through   She had previously been evaluated for hematuria in 2007 and had a negative w/u then. She has been on warfarin but was off at the last visit but is back on it now along with plavix and she is an everyday smoker.    She has had a tiny prior stone remotely and had a CT in 2014 that showed renal cysts.   ROS:  ROS  Allergies  Allergen Reactions  . Aleve [Naproxen Sodium] Hives and Itching  . Cortisone Swelling    Internal organs  . Prednisone Swelling    Makes internal organs swell  . Vancomycin Itching    Past Medical History:  Diagnosis Date  . COPD (chronic obstructive pulmonary disease) ([place])   . Depression   . DM (diabetes mellitus) ([place])   . DVT (deep venous thrombosis) ([facility])    x5  . Family history of colon cancer   . GERD (gastroesophageal reflux disease)   . Hyperlipemia   . Hypertension   . Hypothyroid   . Obesity   . Osteomyelitis of great toe of right foot ([place]) 07/04/2018  . PVD (peripheral vascular disease) ([place])   . Unilateral complete BKA, right, initial encounter ([place]) 07/11/2018  . Unilateral complete BKA, right, subsequent encounter ([place])   . Uterine cancer ([place])   . UTI (urinary tract infection)     Past Surgical History:  Procedure Laterality Date  . ABDOMINAL HYSTERECTOMY    . AMPUTATION Right 07/08/2018   Procedure: RIGHT BELOW KNEE AMPUTATION;  Surgeon: [name], MD;  Location: [facility];  Service: Orthopedics;  Laterality: Right;  . CESAREAN SECTION    . FEMORAL BYPASS     x 5  . LUMBAR DISC SURGERY     L4-L5  . TOE AMPUTATION     right  .  TONSILLECTOMY      Social History   Socioeconomic History  . Marital status: Married    Spouse name: Kasandra Knudsen  . Number of children: 1  . Years of education: 21  . Highest education level: Master's degree (e.g., MA, MS, MEng, MEd, MSW, MBA)  Occupational History  . Occupation: Product manager: [employer]  . Occupation: retired  Tobacco Use  . Smoking status: Current Every Day Smoker    Packs/day: 0.50    Years: 38.00    Pack years: 19.00    Types: Cigarettes  . Smokeless tobacco: Never Used  Substance and Sexual Activity  . Alcohol use: No    Alcohol/week: 0.0 standard drinks  . Drug use: Yes    Frequency: 4.0 times per week    Types: Marijuana  . Sexual activity: Not Currently    Birth control/protection: Surgical  Other Topics Concern  . Not on file  Social History Narrative  . Not on file   Social Determinants of Health   Financial Resource Strain: Low Risk   . Difficulty of Paying Living Expenses: Not hard at all  Food Insecurity: No Food Insecurity  . Worried About Charity fundraiser in the Last Year: Never  true  . Ran Out of Food in the Last Year: Never true  Transportation Needs: No Transportation Needs  . Lack of Transportation (Medical): No  . Lack of Transportation (Non-Medical): No  Physical Activity: Inactive  . Days of Exercise per Week: 0 days  . Minutes of Exercise per Session: 0 min  Stress: No Stress Concern Present  . Feeling of Stress : Not at all  Social Connections: Somewhat Isolated  . Frequency of Communication with Friends and Family: More than three times a week  . Frequency of Social Gatherings with Friends and Family: More than three times a week  . Attends Religious Services: Never  . Active Member of Clubs or Organizations: No  . Attends Archivist Meetings: Never  . Marital Status: Married  Human resources officer Violence: Not At Risk  . Fear of Current or Ex-Partner: No  . Emotionally Abused: No  . Physically  Abused: No  . Sexually Abused: No    Family History  Problem Relation Age of Onset  . Colon cancer Brother 40  . Hypertension Mother   . Hypothyroidism Mother   . Heart disease Father   . Diabetes Father   . Hyperlipidemia Father   . Clotting disorder Brother   . Diabetes Brother   . Heart disease Brother     Anti-infectives: Anti-infectives (From admission, onward)   None      Current Outpatient Medications  Medication Sig Dispense Refill  . acetaminophen (TYLENOL) 325 MG tablet Take 1-2 tablets (325-650 mg total) by mouth every 4 (four) hours as needed for mild pain.    Marland Kitchen albuterol (PROVENTIL HFA;VENTOLIN HFA) 108 (90 Base) MCG/ACT inhaler Inhale 2 puffs into the lungs every 6 (six) hours as needed for wheezing or shortness of breath. 1 Inhaler 11  . aspirin EC 81 MG EC tablet Take 1 tablet (81 mg total) by mouth daily.    . Cholecalciferol (VITAMIN D3) 5000 units TABS Take 1 tablet by mouth daily.    . clopidogrel (PLAVIX) 75 MG tablet TAKE 1 TABLET BY MOUTH DAILY. 30 tablet 3  . escitalopram (LEXAPRO) 10 MG tablet Take 1 tablet (10 mg total) by mouth daily. 30 tablet 5  . fenofibrate (TRICOR) 145 MG tablet Take 1 tablet (145 mg total) by mouth daily. 90 tablet 0  . furosemide (LASIX) 20 MG tablet Take 1 tablet (20 mg total) by mouth daily. 90 tablet 0  . glimepiride (AMARYL) 2 MG tablet TAKE 1 TABLET ONCE DAILY BEFORE BREAKFAST 30 tablet 2  . levothyroxine (SYNTHROID) 100 MCG tablet Take 1 tablet (100 mcg total) by mouth daily before breakfast. 90 tablet 3  . losartan (COZAAR) 50 MG tablet Take 1 tablet (50 mg total) by mouth daily. 90 tablet 0  . metFORMIN (GLUCOPHAGE) 1000 MG tablet Take 1 tablet (1,000 mg total) by mouth 2 (two) times daily with a meal. 180 tablet 3  . ONE TOUCH ULTRA TEST test strip TEST BLOOD SUGARS TWICE A DAY 50 each 2  . pantoprazole (PROTONIX) 40 MG tablet Take 1 tablet (40 mg total) by mouth daily. 30 tablet 0  . Potassium 99 MG TABS Take 1 tablet  by mouth daily.    . pravastatin (PRAVACHOL) 20 MG tablet Take 1 tablet (20 mg total) by mouth daily. 90 tablet 3  . tretinoin (RETIN-A) 0.05 % cream Apply topically at bedtime. 45 g 3  . triamcinolone cream (KENALOG) 0.1 % Apply topically 2 (two) times daily. 30 g 0  . warfarin (  COUMADIN) 5 MG tablet TAKE AS DIRECTED UP TO 1 & 1/2 TABLETS A DAY 135 tablet 0   No current facility-administered medications for this visit.     Objective: Vital signs in last 24 hours: There were no vitals taken for this visit.  Intake/Output from previous day: No intake/output data recorded. Intake/Output this shift: @IOTHISSHIFT @   Physical Exam  Lab Results:  No results found for this or any previous visit (from the past 24 hour(s)).  BMET No results for input(s): NA, K, CL, CO2, GLUCOSE, BUN, CREATININE, CALCIUM in the last 72 hours. PT/INR No results for input(s): LABPROT, INR in the last 72 hours. ABG No results for input(s): PHART, HCO3 in the last 72 hours.  Invalid input(s): PCO2, PO2  Studies/Results: No results found.   Assessment/Plan: No problem-specific Assessment & Plan notes found for this encounter.   No orders of the defined types were placed in this encounter.    No orders of the defined types were placed in this encounter.    No follow-ups on file.    CC: ***     Irine Seal 10/06/2019 [phone]

## 2019-10-11 ENCOUNTER — Other Ambulatory Visit: Payer: Self-pay | Admitting: *Deleted

## 2019-10-11 MED ORDER — WARFARIN SODIUM 5 MG PO TABS: ORAL_TABLET | ORAL | 0 refills | Status: DC

## 2019-10-13 ENCOUNTER — Encounter: Payer: Self-pay | Admitting: Gastroenterology

## 2019-10-25 ENCOUNTER — Other Ambulatory Visit: Payer: Self-pay | Admitting: Family Medicine

## 2019-10-25 DIAGNOSIS — F339 Major depressive disorder, recurrent, unspecified: Secondary | ICD-10-CM

## 2019-10-25 DIAGNOSIS — K219 Gastro-esophageal reflux disease without esophagitis: Secondary | ICD-10-CM

## 2019-10-25 MED ORDER — ESCITALOPRAM OXALATE 10 MG PO TABS: 10.0000 mg | ORAL_TABLET | Freq: Every day | ORAL | 0 refills | Status: DC

## 2019-10-26 ENCOUNTER — Telehealth: Payer: Self-pay | Admitting: Family Medicine

## 2019-10-26 NOTE — Telephone Encounter (Signed)
Patient scheduled for 07/21. After all her procedures because she wants to go over with Dr. Darnell Level

## 2019-10-26 NOTE — Telephone Encounter (Signed)
Patient not on any controlled she will scheduled after her procedure

## 2019-10-27 ENCOUNTER — Ambulatory Visit: Payer: Medicare Other | Admitting: Family Medicine

## 2019-11-01 ENCOUNTER — Other Ambulatory Visit: Payer: Self-pay | Admitting: *Deleted

## 2019-11-01 MED ORDER — GLIMEPIRIDE 2 MG PO TABS: ORAL_TABLET | ORAL | 1 refills | Status: DC

## 2019-11-13 ENCOUNTER — Ambulatory Visit (INDEPENDENT_AMBULATORY_CARE_PROVIDER_SITE_OTHER): Payer: Medicare Other | Admitting: Orthopedic Surgery

## 2019-11-13 ENCOUNTER — Encounter: Payer: Self-pay | Admitting: Orthopedic Surgery

## 2019-11-13 VITALS — Ht 70.0 in | Wt 242.0 lb

## 2019-11-13 DIAGNOSIS — Z89511 Acquired absence of right leg below knee: Secondary | ICD-10-CM

## 2019-11-13 NOTE — Progress Notes (Signed)
Office Visit Note   Patient: [name]           Date of Birth: 05-Oct-1960           MRN: [id number] Visit Date: 11/13/2019              Requested by: Baruch Gouty, FNP No address on file PCP: Baruch Gouty, FNP  Chief Complaint  Patient presents with  . Right Leg - Follow-up    07/08/18 right BKA       HPI: Patient is a 58 year old woman who is status post right transtibial amputation. She is currently followed by [facility] she has been having prominence of her distal tibia and has had her socket modified. Patient states she did initially have some fluid drainage but this has resolved.  Assessment & Plan: Visit Diagnoses: No diagnosis found.  Plan: We'll have her continue with the socket modifications. Patient has had atrophy of the soft tissue and muscle that was covering the distal tibia and with this atrophy she has bony prominence.  Follow-Up Instructions: No follow-ups on file.   Ortho Exam  Patient is alert, oriented, no adenopathy, well-dressed, normal affect, normal respiratory effort. Examination there is bony prominence from muscle atrophy of the transtibial amputation. There is no redness no cellulitis no drainage no signs of infection she does have some discoloration where she previously had an ulcer.  Imaging: No results found. No images are attached to the encounter.  Labs: Lab Results  Component Value Date   HGBA1C 6.5 07/03/2019   HGBA1C 6.8 03/29/2019   HGBA1C 7.6 (H) 12/27/2018   ESRSEDRATE 19 10/25/2012   CRP 0.6 10/25/2012   LABURIC 5.7 12/10/2006   REPTSTATUS 07/09/2018 FINAL 07/04/2018   CULT NO GROWTH 5 DAYS 07/04/2018     Lab Results  Component Value Date   ALBUMIN 4.0 07/03/2019   ALBUMIN 3.9 03/29/2019   ALBUMIN 3.6 (L) 12/27/2018   LABURIC 5.7 12/10/2006    Lab Results  Component Value Date   MG 1.7 07/08/2018   Lab Results  Component Value Date   VD25OH 67.6 03/29/2019   VD25OH 72.2 12/27/2018   VD25OH 56.8  11/09/2018    No results found for: PREALBUMIN CBC EXTENDED Latest Ref Rng & Units 07/03/2019 03/29/2019 12/27/2018  WBC 3.4 - 10.8 x10E3/uL 12.9(H) 12.6(H) 12.1(H)  RBC 3.77 - 5.28 x10E6/uL 4.95 5.19 5.04  HGB 11.1 - 15.9 g/dL 14.4 14.6 13.7  HCT 34.0 - 46.6 % 42.0 44.1 41.7  PLT 150 - 450 x10E3/uL 300 273 239  NEUTROABS 1.4 - 7.0 x10E3/uL 6.9 5.8 6.5  LYMPHSABS 0.7 - 3.1 x10E3/uL 4.6(H) 5.4(H) 4.5(H)     Body mass index is 34.72 kg/m.  Orders:  No orders of the defined types were placed in this encounter.  No orders of the defined types were placed in this encounter.    Procedures: No procedures performed  Clinical Data: No additional findings.  ROS:  All other systems negative, except as noted in the HPI. Review of Systems  Objective: Vital Signs: Ht 5\' 10"  (1.778 m)   Wt 242 lb (109.8 kg)   BMI 34.72 kg/m   Specialty Comments:  No specialty comments available.  PMFS History: Patient Active Problem List   Diagnosis Date Noted  . History of DVT (deep vein thrombosis) 07/03/2019  . Statin intolerance 04/19/2019  . Vitamin D deficiency 12/27/2018  . Type 2 diabetes mellitus with diabetic peripheral angiopathy and gangrene, without long-term current use of  insulin ([place]) 12/27/2018  . DVT of axillary vein, chronic, bilateral ([place]) 12/27/2018  . Hypertension associated with chronic kidney disease due to type 2 diabetes mellitus ([place]) 12/27/2018  . Long term current use of anticoagulant 12/27/2018  . Medically noncompliant 12/27/2018  . S/P unilateral BKA (below knee amputation), right ([place])   . Subtherapeutic international normalized ratio (INR)   . Noncompliance   . Leukocytosis   . Acute blood loss anemia   . Diabetes mellitus type 2 in obese ([place])   . Acquired absence of right leg below knee ([place]) 07/08/2018  . Mild protein-calorie malnutrition ([place])   . Intermittent claudication ([place]) 05/11/2017  . Deep vein thrombosis (DVT) of both lower extremities  ([place]) 01/05/2017  . Peripheral vascular insufficiency ([place]) 09/25/2015  . Hypothyroidism 01/29/2014  . Obesity (BMI 30-39.9) 08/16/2013  . Impetigo 07/28/2013  . Chronic obstructive pulmonary disease ([place])   . Malignant neoplasm of uterus ([place])   . GERD (gastroesophageal reflux disease) 10/18/2012  . Hernia, hiatal 10/18/2012  . Hyperlipidemia associated with type 2 diabetes mellitus ([place]) 04/18/2008  . SMOKER 04/18/2008   Past Medical History:  Diagnosis Date  . COPD (chronic obstructive pulmonary disease) ([place])   . Depression   . DM (diabetes mellitus) ([place])   . DVT (deep venous thrombosis) (H[facility])    x5  . Family history of colon cancer   . GERD (gastroesophageal reflux disease)   . Hyperlipemia   . Hypertension   . Hypothyroid   . Obesity   . Osteomyelitis of great toe of right foot ([place]) 07/04/2018  . PVD (peripheral vascular disease) ([place])   . Unilateral complete BKA, right, initial encounter ([place]) 07/11/2018  . Unilateral complete BKA, right, subsequent encounter ([place])   . Uterine cancer ([place])   . UTI (urinary tract infection)     Family History  Problem Relation Age of Onset  . Colon cancer Brother 57  . Hypertension Mother   . Hypothyroidism Mother   . Heart disease Father   . Diabetes Father   . Hyperlipidemia Father   . Clotting disorder Brother   . Diabetes Brother   . Heart disease Brother     Past Surgical History:  Procedure Laterality Date  . ABDOMINAL HYSTERECTOMY    . AMPUTATION Right 07/08/2018   Procedure: RIGHT BELOW KNEE AMPUTATION;  Surgeon: [name], MD;  Location: [facility];  Service: Orthopedics;  Laterality: Right;  . CESAREAN SECTION    . FEMORAL BYPASS     x 5  . LUMBAR DISC SURGERY     L4-L5  . TOE AMPUTATION     right  . TONSILLECTOMY     Social History   Occupational History  . Occupation: Product manager: [employer]  . Occupation: retired  Tobacco Use  . Smoking status: Current Every Day Smoker    Packs/day:  0.50    Years: 38.00    Pack years: 19.00    Types: Cigarettes  . Smokeless tobacco: Never Used  Substance and Sexual Activity  . Alcohol use: No    Alcohol/week: 0.0 standard drinks  . Drug use: Yes    Frequency: 4.0 times per week    Types: Marijuana  . Sexual activity: Not Currently    Birth control/protection: Surgical

## 2019-11-16 ENCOUNTER — Ambulatory Visit: Payer: Medicare Other | Admitting: Gastroenterology

## 2019-11-21 ENCOUNTER — Other Ambulatory Visit: Payer: Self-pay | Admitting: *Deleted

## 2019-11-21 DIAGNOSIS — F339 Major depressive disorder, recurrent, unspecified: Secondary | ICD-10-CM

## 2019-11-21 DIAGNOSIS — K219 Gastro-esophageal reflux disease without esophagitis: Secondary | ICD-10-CM

## 2019-11-21 MED ORDER — PANTOPRAZOLE SODIUM 40 MG PO TBEC: 40.0000 mg | DELAYED_RELEASE_TABLET | Freq: Every day | ORAL | 0 refills | Status: DC

## 2019-11-21 MED ORDER — ESCITALOPRAM OXALATE 10 MG PO TABS: 10.0000 mg | ORAL_TABLET | Freq: Every day | ORAL | 0 refills | Status: DC

## 2019-11-21 NOTE — Addendum Note (Signed)
Addended by: Antonietta Barcelona D on: 11/21/2019 10:53 AM   Modules accepted: Orders

## 2019-11-23 ENCOUNTER — Other Ambulatory Visit: Payer: Self-pay

## 2019-11-23 MED ORDER — CLOPIDOGREL BISULFATE 75 MG PO TABS: 75.0000 mg | ORAL_TABLET | Freq: Every day | ORAL | 2 refills | Status: DC

## 2019-11-24 ENCOUNTER — Ambulatory Visit: Payer: Medicare Other | Admitting: Urology

## 2019-12-05 ENCOUNTER — Other Ambulatory Visit: Payer: Self-pay | Admitting: Pharmacist Clinician (PhC)/ Clinical Pharmacy Specialist

## 2019-12-12 ENCOUNTER — Ambulatory Visit: Payer: Medicare Other | Admitting: Orthopedic Surgery

## 2019-12-15 ENCOUNTER — Ambulatory Visit: Payer: Medicare Other | Admitting: Urology

## 2019-12-18 ENCOUNTER — Other Ambulatory Visit: Payer: Self-pay | Admitting: Family Medicine

## 2019-12-18 DIAGNOSIS — F339 Major depressive disorder, recurrent, unspecified: Secondary | ICD-10-CM

## 2019-12-18 DIAGNOSIS — K219 Gastro-esophageal reflux disease without esophagitis: Secondary | ICD-10-CM

## 2019-12-27 ENCOUNTER — Other Ambulatory Visit: Payer: Self-pay

## 2019-12-27 ENCOUNTER — Ambulatory Visit (INDEPENDENT_AMBULATORY_CARE_PROVIDER_SITE_OTHER): Payer: Medicare Other | Admitting: Family Medicine

## 2019-12-27 ENCOUNTER — Encounter: Payer: Self-pay | Admitting: Family Medicine

## 2019-12-27 VITALS — BP 106/61 | HR 82 | Temp 97.9°F | Ht 70.0 in | Wt 235.0 lb

## 2019-12-27 DIAGNOSIS — K219 Gastro-esophageal reflux disease without esophagitis: Secondary | ICD-10-CM | POA: Diagnosis not present

## 2019-12-27 DIAGNOSIS — E785 Hyperlipidemia, unspecified: Secondary | ICD-10-CM | POA: Diagnosis not present

## 2019-12-27 DIAGNOSIS — E1152 Type 2 diabetes mellitus with diabetic peripheral angiopathy with gangrene: Secondary | ICD-10-CM | POA: Diagnosis not present

## 2019-12-27 DIAGNOSIS — G720 Drug-induced myopathy: Secondary | ICD-10-CM | POA: Diagnosis not present

## 2019-12-27 DIAGNOSIS — F339 Major depressive disorder, recurrent, unspecified: Secondary | ICD-10-CM | POA: Diagnosis not present

## 2019-12-27 DIAGNOSIS — T466X5A Adverse effect of antihyperlipidemic and antiarteriosclerotic drugs, initial encounter: Secondary | ICD-10-CM | POA: Diagnosis not present

## 2019-12-27 DIAGNOSIS — E1122 Type 2 diabetes mellitus with diabetic chronic kidney disease: Secondary | ICD-10-CM | POA: Diagnosis not present

## 2019-12-27 DIAGNOSIS — I129 Hypertensive chronic kidney disease with stage 1 through stage 4 chronic kidney disease, or unspecified chronic kidney disease: Secondary | ICD-10-CM | POA: Diagnosis not present

## 2019-12-27 DIAGNOSIS — Z86718 Personal history of other venous thrombosis and embolism: Secondary | ICD-10-CM

## 2019-12-27 DIAGNOSIS — E039 Hypothyroidism, unspecified: Secondary | ICD-10-CM

## 2019-12-27 DIAGNOSIS — Z7901 Long term (current) use of anticoagulants: Secondary | ICD-10-CM

## 2019-12-27 DIAGNOSIS — E1169 Type 2 diabetes mellitus with other specified complication: Secondary | ICD-10-CM | POA: Diagnosis not present

## 2019-12-27 LAB — COAGUCHEK XS/INR WAIVED
INR: 2.3 — ABNORMAL HIGH (ref 0.9–1.1)
Prothrombin Time: 27.9 s

## 2019-12-27 LAB — BAYER DCA HB A1C WAIVED: HB A1C (BAYER DCA - WAIVED): 7.2 % — ABNORMAL HIGH (ref <7.0)

## 2019-12-27 MED ORDER — ESCITALOPRAM OXALATE 10 MG PO TABS: 10.0000 mg | ORAL_TABLET | Freq: Every day | ORAL | 3 refills | Status: DC

## 2019-12-27 MED ORDER — FUROSEMIDE 20 MG PO TABS: 20.0000 mg | ORAL_TABLET | Freq: Every day | ORAL | 3 refills | Status: DC

## 2019-12-27 MED ORDER — LOSARTAN POTASSIUM 50 MG PO TABS: 50.0000 mg | ORAL_TABLET | Freq: Every day | ORAL | 3 refills | Status: DC

## 2019-12-27 MED ORDER — FENOFIBRATE 145 MG PO TABS: 145.0000 mg | ORAL_TABLET | Freq: Every day | ORAL | 3 refills | Status: DC

## 2019-12-27 MED ORDER — CLOPIDOGREL BISULFATE 75 MG PO TABS: 75.0000 mg | ORAL_TABLET | Freq: Every day | ORAL | 3 refills | Status: DC

## 2019-12-27 MED ORDER — WARFARIN SODIUM 5 MG PO TABS: ORAL_TABLET | ORAL | 0 refills | Status: DC

## 2019-12-27 MED ORDER — METFORMIN HCL 1000 MG PO TABS: 1000.0000 mg | ORAL_TABLET | Freq: Two times a day (BID) | ORAL | 1 refills | Status: DC

## 2019-12-27 MED ORDER — PANTOPRAZOLE SODIUM 40 MG PO TBEC: 40.0000 mg | DELAYED_RELEASE_TABLET | Freq: Every day | ORAL | 3 refills | Status: DC

## 2019-12-27 MED ORDER — GLIMEPIRIDE 2 MG PO TABS: 2.0000 mg | ORAL_TABLET | Freq: Every day | ORAL | 3 refills | Status: DC

## 2019-12-27 NOTE — Progress Notes (Addendum)
Subjective: CC: DM, DVT PCP: Janora Norlander, DO [name] is a 59 y.o. female presenting to clinic today for:  1. Type 2 Diabetes w/ HTN, HLD:  Patient reports that she does not monitor her blood sugars.  She recently discontinued the pravastatin secondary to myalgia.  Myalgia has since totally resolved.  She continues to take her TriCor, OTC fish oils, Amaryl 2 mg daily and metformin 1000 mg twice daily.  She does admit to eating a lot of junk lately but has recently cleared out her cabinets and is getting back on track with diet.  No visual disturbance.  No chest pain or shortness of breath.  Last eye exam: Needs.  Sees my eye doctor Last foot exam: needs. H/o RLE BKA Last A1c:  Lab Results  Component Value Date   HGBA1C 6.5 07/03/2019   Nephropathy screen indicated?: on ARB Last flu, zoster and/or pneumovax:  Immunization History  Administered Date(s) Administered  . Td 06/08/2005    2. H/o DVT w/ BKA Patient with history of 5 DVTs.  She is on chronic anticoagulation with Coumadin 5 mg every other day alternating with 7.5 mg.  No hematochezia, melena, vaginal bleeding or hematuria.  Denies any heart palpitations, shortness of breath.  She denies any redness, irritation or skin breakdown of her right stump.  3.  Hypothyroidism Patient reports compliance with Synthroid 100 mcg daily.  She takes this with all of her other medicines.  Not report any biotin use.  Does not report any change in voice, difficulty swallowing, tremor, change in bowel habits. ROS: Per HPI  Allergies  Allergen Reactions  . Aleve [Naproxen Sodium] Hives and Itching  . Cortisone Swelling    Internal organs  . Prednisone Swelling    Makes internal organs swell  . Vancomycin Itching   Past Medical History:  Diagnosis Date  . COPD (chronic obstructive pulmonary disease) ([place])   . Depression   . DM (diabetes mellitus) ([place])   . DVT (deep venous thrombosis) (H[facility])    x5  . Family  history of colon cancer   . GERD (gastroesophageal reflux disease)   . Hyperlipemia   . Hypertension   . Hypothyroid   . Obesity   . Osteomyelitis of great toe of right foot ([place]) 07/04/2018  . PVD (peripheral vascular disease) ([place])   . Unilateral complete BKA, right, initial encounter ([place]) 07/11/2018  . Unilateral complete BKA, right, subsequent encounter ([place])   . Uterine cancer ([place])   . UTI (urinary tract infection)     Current Outpatient Medications:  .  acetaminophen (TYLENOL) 325 MG tablet, Take 1-2 tablets (325-650 mg total) by mouth every 4 (four) hours as needed for mild pain., Disp: , Rfl:  .  albuterol (PROVENTIL HFA;VENTOLIN HFA) 108 (90 Base) MCG/ACT inhaler, Inhale 2 puffs into the lungs every 6 (six) hours as needed for wheezing or shortness of breath., Disp: 1 Inhaler, Rfl: 11 .  aspirin EC 81 MG EC tablet, Take 1 tablet (81 mg total) by mouth daily., Disp: , Rfl:  .  Cholecalciferol (VITAMIN D3) 5000 units TABS, Take 1 tablet by mouth daily., Disp: , Rfl:  .  clopidogrel (PLAVIX) 75 MG tablet, Take 1 tablet (75 mg total) by mouth daily., Disp: 30 tablet, Rfl: 2 .  escitalopram (LEXAPRO) 10 MG tablet, TAKE 1 TABLET DAILY, Disp: 30 tablet, Rfl: 0 .  fenofibrate (TRICOR) 145 MG tablet, Take 1 tablet (145 mg total) by mouth daily., Disp: 90 tablet, Rfl:  0 .  furosemide (LASIX) 20 MG tablet, TAKE 1 TABLET DAILY, Disp: 90 tablet, Rfl: 0 .  glimepiride (AMARYL) 2 MG tablet, TAKE 1 TABLET ONCE DAILY BEFORE BREAKFAST, Disp: 30 tablet, Rfl: 0 .  levothyroxine (SYNTHROID) 100 MCG tablet, Take 1 tablet (100 mcg total) by mouth daily before breakfast., Disp: 90 tablet, Rfl: 3 .  losartan (COZAAR) 50 MG tablet, TAKE 1 TABLET DAILY, Disp: 90 tablet, Rfl: 0 .  metFORMIN (GLUCOPHAGE) 1000 MG tablet, Take 1 tablet (1,000 mg total) by mouth 2 (two) times daily with a meal., Disp: 60 tablet, Rfl: 0 .  ONE TOUCH ULTRA TEST test strip, TEST BLOOD SUGARS TWICE A DAY, Disp: 50 each, Rfl: 2 .   pantoprazole (PROTONIX) 40 MG tablet, TAKE 1 TABLET DAILY, Disp: 30 tablet, Rfl: 0 .  Potassium 99 MG TABS, Take 1 tablet by mouth daily., Disp: , Rfl:  .  pravastatin (PRAVACHOL) 20 MG tablet, Take 1 tablet (20 mg total) by mouth daily., Disp: 90 tablet, Rfl: 3 .  tretinoin (RETIN-A) 0.05 % cream, Apply topically at bedtime., Disp: 45 g, Rfl: 3 .  triamcinolone cream (KENALOG) 0.1 %, Apply topically 2 (two) times daily., Disp: 30 g, Rfl: 0 .  warfarin (COUMADIN) 5 MG tablet, TAKE AS DIRECTED UP TO 1 & 1/2 TABLETS A DAY, Disp: 135 tablet, Rfl: 0 Social History   Socioeconomic History  . Marital status: Married    Spouse name: Kasandra Knudsen  . Number of children: 1  . Years of education: 48  . Highest education level: Master's degree (e.g., MA, MS, MEng, MEd, MSW, MBA)  Occupational History  . Occupation: Product manager: [employer]  . Occupation: retired  Tobacco Use  . Smoking status: Current Every Day Smoker    Packs/day: 0.50    Years: 38.00    Pack years: 19.00    Types: Cigarettes  . Smokeless tobacco: Never Used  Vaping Use  . Vaping Use: Never used  Substance and Sexual Activity  . Alcohol use: No    Alcohol/week: 0.0 standard drinks  . Drug use: Yes    Frequency: 4.0 times per week    Types: Marijuana  . Sexual activity: Not Currently    Birth control/protection: Surgical  Other Topics Concern  . Not on file  Social History Narrative  . Not on file   Social Determinants of Health   Financial Resource Strain:   . Difficulty of Paying Living Expenses:   Food Insecurity:   . Worried About Charity fundraiser in the Last Year:   . Arboriculturist in the Last Year:   Transportation Needs:   . Film/video editor (Medical):   Marland Kitchen Lack of Transportation (Non-Medical):   Physical Activity:   . Days of Exercise per Week:   . Minutes of Exercise per Session:   Stress:   . Feeling of Stress :   Social Connections:   . Frequency of Communication with Friends  and Family:   . Frequency of Social Gatherings with Friends and Family:   . Attends Religious Services:   . Active Member of Clubs or Organizations:   . Attends Archivist Meetings:   Marland Kitchen Marital Status:   Intimate Partner Violence:   . Fear of Current or Ex-Partner:   . Emotionally Abused:   Marland Kitchen Physically Abused:   . Sexually Abused:    Family History  Problem Relation Age of Onset  . Colon cancer Brother 71  .  Hypertension Mother   . Hypothyroidism Mother   . Heart disease Father   . Diabetes Father   . Hyperlipidemia Father   . Clotting disorder Brother   . Diabetes Brother   . Heart disease Brother     Objective: Office vital signs reviewed. BP 106/61   Pulse 82   Temp 97.9 F (36.6 C) (Temporal)   Ht 5' 10"  (1.778 m)   Wt 235 lb (106.6 kg)   SpO2 97%   BMI 33.72 kg/m   Physical Examination:  General: Awake, alert, obese, No acute distress HEENT: Normal; no exophthalmos.  No goiter Cardio: regular rate and rhythm, S1S2 heard, no murmurs appreciated Pulm: clear to auscultation bilaterally, no wheezes, rhonchi or rales; normal work of breathing on room air Extremities: RLE surgically absent.  She has history of BKA MSK: Arrives in wheelchair.  She has full range of motion of right lower extremity and is able to self propel in wheelchair  Assessment/ Plan: 59 y.o. female   1. Type 2 diabetes mellitus with diabetic peripheral angiopathy and gangrene, without long-term current use of insulin (HCC) A1c is 7.2 today.  I reinforced carbohydrate restriction.  We will make no changes today but if persistently above 7, may need to consider adding additional medication - Bayer DCA Hb A1c Waived  2. Hypertension associated with chronic kidney disease due to type 2 diabetes mellitus ([place]) Controlled - CMP14+EGFR  3. Hyperlipidemia associated with type 2 diabetes mellitus ([place]) Off of statin secondary to myopathy.  We will plan for fasting lipid panel at her next  visit.  If persistently in high risk category may need to consider adding something like Zetia or Nexletol - CMP14+EGFR  4. History of DVT (deep vein thrombosis)  5. On anticoagulant therapy INR is therapeutic. - CoaguChek XS/INR Waived  6. Acquired hypothyroidism Asymptomatic - Thyroid Panel With TSH  7. Statin myopathy - fenofibrate (TRICOR) 145 MG tablet; Take 1 tablet (145 mg total) by mouth daily.  Dispense: 90 tablet; Refill: 3  8. Depression, recurrent ([place]) - escitalopram (LEXAPRO) 10 MG tablet; Take 1 tablet (10 mg total) by mouth daily.  Dispense: 90 tablet; Refill: 3  9. Gastroesophageal reflux disease without esophagitis - pantoprazole (PROTONIX) 40 MG tablet; Take 1 tablet (40 mg total) by mouth daily.  Dispense: 90 tablet; Refill: 3   Orders Placed This Encounter  Procedures  . Bayer DCA Hb A1c Waived  . CoaguChek XS/INR Waived  . CMP14+EGFR  . Thyroid Panel With TSH   Meds ordered this encounter  Medications  . fenofibrate (TRICOR) 145 MG tablet    Sig: Take 1 tablet (145 mg total) by mouth daily.    Dispense:  90 tablet    Refill:  3  . escitalopram (LEXAPRO) 10 MG tablet    Sig: Take 1 tablet (10 mg total) by mouth daily.    Dispense:  90 tablet    Refill:  3  . clopidogrel (PLAVIX) 75 MG tablet    Sig: Take 1 tablet (75 mg total) by mouth daily.    Dispense:  90 tablet    Refill:  3  . glimepiride (AMARYL) 2 MG tablet    Sig: Take 1 tablet (2 mg total) by mouth daily with breakfast.    Dispense:  90 tablet    Refill:  3  . furosemide (LASIX) 20 MG tablet    Sig: Take 1 tablet (20 mg total) by mouth daily.    Dispense:  90 tablet  Refill:  3  . losartan (COZAAR) 50 MG tablet    Sig: Take 1 tablet (50 mg total) by mouth daily.    Dispense:  90 tablet    Refill:  3  . metFORMIN (GLUCOPHAGE) 1000 MG tablet    Sig: Take 1 tablet (1,000 mg total) by mouth 2 (two) times daily with a meal.    Dispense:  180 tablet    Refill:  1  . warfarin  (COUMADIN) 5 MG tablet    Sig: TAKE AS DIRECTED UP TO 1 & 1/2 TABLETS A DAY    Dispense:  135 tablet    Refill:  0  . pantoprazole (PROTONIX) 40 MG tablet    Sig: Take 1 tablet (40 mg total) by mouth daily.    Dispense:  90 tablet    Refill:  [place] , [place] [phone]

## 2019-12-28 ENCOUNTER — Other Ambulatory Visit: Payer: Self-pay | Admitting: Family Medicine

## 2019-12-28 LAB — CMP14+EGFR
ALT: 14 IU/L (ref 0–32)
AST: 15 IU/L (ref 0–40)
Albumin/Globulin Ratio: 1.3 (ref 1.2–2.2)
Albumin: 3.9 g/dL (ref 3.8–4.9)
Alkaline Phosphatase: 51 IU/L (ref 48–121)
BUN/Creatinine Ratio: 14 (ref 9–23)
BUN: 12 mg/dL (ref 6–24)
Bilirubin Total: 0.2 mg/dL (ref 0.0–1.2)
CO2: 23 mmol/L (ref 20–29)
Calcium: 9.6 mg/dL (ref 8.7–10.2)
Chloride: 105 mmol/L (ref 96–106)
Creatinine, Ser: 0.87 mg/dL (ref 0.57–1.00)
GFR calc Af Amer: 86 mL/min/{1.73_m2} (ref >59)
GFR calc non Af Amer: 74 mL/min/{1.73_m2} (ref >59)
Globulin, Total: 2.9 g/dL (ref 1.5–4.5)
Glucose: 126 mg/dL — ABNORMAL HIGH (ref 65–99)
Potassium: 4.2 mmol/L (ref 3.5–5.2)
Sodium: 144 mmol/L (ref 134–144)
Total Protein: 6.8 g/dL (ref 6.0–8.5)

## 2019-12-28 LAB — THYROID PANEL WITH TSH
Free Thyroxine Index: 2.6 (ref 1.2–4.9)
T3 Uptake Ratio: 29 % (ref 24–39)
T4, Total: 9.1 ug/dL (ref 4.5–12.0)
TSH: 2.1 u[IU]/mL (ref 0.450–4.500)

## 2019-12-28 MED ORDER — LEVOTHYROXINE SODIUM 100 MCG PO TABS: 100.0000 ug | ORAL_TABLET | Freq: Every day | ORAL | 3 refills | Status: DC

## 2020-01-01 ENCOUNTER — Telehealth: Payer: Self-pay

## 2020-01-01 NOTE — Telephone Encounter (Signed)
Patient called stating that she is needing a Rx for a new prosthesis.  Cb# [phone].  Please advise.  Thank you.

## 2020-01-01 NOTE — Telephone Encounter (Signed)
Please write rx thanks

## 2020-01-02 ENCOUNTER — Telehealth: Payer: Self-pay

## 2020-01-02 NOTE — Telephone Encounter (Signed)
I called pt and advised that she does not first need an appt we will fax over rx today.

## 2020-01-02 NOTE — Telephone Encounter (Signed)
Patient called again stating that Rx needs to be sent to [facility].  Would like to know if she needs to been before Rx is sent to [facility]?  Cb#[phone].  Please advise.  Thank you.

## 2020-01-03 ENCOUNTER — Telehealth: Payer: Self-pay | Admitting: *Deleted

## 2020-01-03 NOTE — Telephone Encounter (Signed)
Fax from [facility] RF request for Pravastatin 20 mg 1 QD #90 No on current med list. Last OV 12/27/19 Next OV 03/26/20

## 2020-01-04 ENCOUNTER — Ambulatory Visit: Payer: Medicare Other | Admitting: Gastroenterology

## 2020-01-05 ENCOUNTER — Other Ambulatory Visit: Payer: Self-pay | Admitting: Family Medicine

## 2020-01-05 DIAGNOSIS — E785 Hyperlipidemia, unspecified: Secondary | ICD-10-CM

## 2020-01-05 MED ORDER — PRAVASTATIN SODIUM 20 MG PO TABS: 20.0000 mg | ORAL_TABLET | Freq: Every day | ORAL | 3 refills | Status: DC

## 2020-01-16 ENCOUNTER — Telehealth: Payer: Self-pay | Admitting: Orthopedic Surgery

## 2020-01-16 NOTE — Telephone Encounter (Signed)
Advised [facility] yesterday that the paperwork was received and that it has been pending signature from last week. Will sign and send asap.

## 2020-01-16 NOTE — Telephone Encounter (Signed)
Pt called stating [facility] never received the rx and she would like for [place] to try again; pt states they won't release her prosthetic without it; however pt doesn't have a direct fax # or name.  [phone]

## 2020-01-25 ENCOUNTER — Ambulatory Visit: Payer: Medicare Other | Attending: Internal Medicine

## 2020-01-25 DIAGNOSIS — Z23 Encounter for immunization: Secondary | ICD-10-CM

## 2020-01-25 NOTE — Progress Notes (Signed)
   Covid-19 Vaccination Clinic  Name:  AZZURE GARABEDIAN    MRN: [id number] DOB: September 07, 1960  01/25/2020  [name] was observed post Covid-19 immunization for 15 minutes without incident. She was provided with Vaccine Information Sheet and instruction to access the V-Safe system.   Ms. Dola Factor was instructed to call 911 with any severe reactions post vaccine: Marland Kitchen Difficulty breathing  . Swelling of face and throat  . A fast heartbeat  . A bad rash all over body  . Dizziness and weakness   Immunizations Administered    Name Date Dose VIS Date Route   Pfizer COVID-19 Vaccine 01/25/2020  2:44 PM 0.3 mL 08/02/2018 Intramuscular   Manufacturer: [place]   Lot: [id number]   [place]: [id number]

## 2020-02-15 ENCOUNTER — Other Ambulatory Visit: Payer: Self-pay | Admitting: *Deleted

## 2020-02-15 ENCOUNTER — Ambulatory Visit: Payer: Medicare Other | Attending: Internal Medicine

## 2020-02-15 DIAGNOSIS — Z23 Encounter for immunization: Secondary | ICD-10-CM

## 2020-02-15 DIAGNOSIS — R21 Rash and other nonspecific skin eruption: Secondary | ICD-10-CM

## 2020-02-15 NOTE — Progress Notes (Signed)
   Covid-19 Vaccination Clinic  Name:  [name]    MRN: [id number] DOB: May 05, 1961  02/15/2020  [name] was observed post Covid-19 immunization for 15 minutes without incident. She was provided with Vaccine Information Sheet and instruction to access the V-Safe system.   [name] was instructed to call 911 with any severe reactions post vaccine: Marland Kitchen Difficulty breathing  . Swelling of face and throat  . A fast heartbeat  . A bad rash all over body  . Dizziness and weakness   Immunizations Administered    Name Date Dose VIS Date Route   Pfizer COVID-19 Vaccine 02/15/2020  2:22 PM 0.3 mL 08/02/2018 Intramuscular   Manufacturer: [place]   Lot: [id number]   [place]: [id number]

## 2020-02-16 MED ORDER — TRETINOIN 0.05 % EX CREA: TOPICAL_CREAM | Freq: Every day | CUTANEOUS | 3 refills | Status: DC

## 2020-02-16 MED ORDER — TRIAMCINOLONE ACETONIDE 0.1 % EX CREA: TOPICAL_CREAM | CUTANEOUS | 0 refills | Status: DC

## 2020-02-22 DIAGNOSIS — Z029 Encounter for administrative examinations, unspecified: Secondary | ICD-10-CM

## 2020-03-26 ENCOUNTER — Other Ambulatory Visit: Payer: Self-pay

## 2020-03-26 ENCOUNTER — Ambulatory Visit (INDEPENDENT_AMBULATORY_CARE_PROVIDER_SITE_OTHER): Payer: Medicare Other | Admitting: Family Medicine

## 2020-03-26 ENCOUNTER — Encounter: Payer: Self-pay | Admitting: Family Medicine

## 2020-03-26 ENCOUNTER — Other Ambulatory Visit: Payer: Self-pay | Admitting: Family Medicine

## 2020-03-26 VITALS — BP 124/75 | HR 83 | Temp 97.5°F | Ht 70.0 in | Wt 241.0 lb

## 2020-03-26 DIAGNOSIS — E1152 Type 2 diabetes mellitus with diabetic peripheral angiopathy with gangrene: Secondary | ICD-10-CM | POA: Diagnosis not present

## 2020-03-26 DIAGNOSIS — Z1159 Encounter for screening for other viral diseases: Secondary | ICD-10-CM

## 2020-03-26 DIAGNOSIS — Z7901 Long term (current) use of anticoagulants: Secondary | ICD-10-CM

## 2020-03-26 DIAGNOSIS — E1169 Type 2 diabetes mellitus with other specified complication: Secondary | ICD-10-CM

## 2020-03-26 DIAGNOSIS — Z1231 Encounter for screening mammogram for malignant neoplasm of breast: Secondary | ICD-10-CM

## 2020-03-26 DIAGNOSIS — E785 Hyperlipidemia, unspecified: Secondary | ICD-10-CM | POA: Diagnosis not present

## 2020-03-26 DIAGNOSIS — I129 Hypertensive chronic kidney disease with stage 1 through stage 4 chronic kidney disease, or unspecified chronic kidney disease: Secondary | ICD-10-CM | POA: Diagnosis not present

## 2020-03-26 DIAGNOSIS — E1122 Type 2 diabetes mellitus with diabetic chronic kidney disease: Secondary | ICD-10-CM

## 2020-03-26 DIAGNOSIS — Z89511 Acquired absence of right leg below knee: Secondary | ICD-10-CM

## 2020-03-26 DIAGNOSIS — Z86718 Personal history of other venous thrombosis and embolism: Secondary | ICD-10-CM

## 2020-03-26 LAB — BAYER DCA HB A1C WAIVED: HB A1C (BAYER DCA - WAIVED): 7.7 % — ABNORMAL HIGH (ref <7.0)

## 2020-03-26 LAB — COAGUCHEK XS/INR WAIVED
INR: 2.3 — ABNORMAL HIGH (ref 0.9–1.1)
Prothrombin Time: 27.3 s

## 2020-03-26 MED ORDER — GLIMEPIRIDE 4 MG PO TABS: 4.0000 mg | ORAL_TABLET | Freq: Every day | ORAL | 0 refills | Status: DC

## 2020-03-26 MED ORDER — DICLOFENAC SODIUM 1 % EX GEL: 2.0000 g | Freq: Four times a day (QID) | CUTANEOUS | 1 refills | Status: DC

## 2020-03-26 NOTE — Progress Notes (Signed)
Subjective: CC: DM, HTN, HLD, DVT PCP: Janora Norlander, DO [id number] [name] is a 59 y.o. female presenting to clinic today for:  1. Type 2 Diabetes w/ HTN, HLD:  History: myalgia with statins. Has h/o BKA due to recurrent DVTs  She reports compliance with OTC fish oils, Amaryl 2 mg daily and metformin 1000 mg twice daily. Sugar was elevated last visit and patient was instructed to carb reduce/ avoid junk foods.  She admits that she continues to eat carbs frequently, particularly when she was ill.  The myalgia she thought she was experiencing from the Statin was not relieved by discontinuation of the statin but rather use of Voltaren gel.  She would like to have a prescription for this.  She has not resumed pravastatin.  She is not checking her fasting blood sugars or blood sugars.  Because she is "too lazy to".  Last eye exam: Needs.  Sees my eye doctor Last foot exam: needs. H/o RLE BKA, needs Last A1c:  Lab Results  Component Value Date   HGBA1C 7.2 (H) 12/27/2019   Nephropathy screen indicated?: on ARB Last flu, zoster and/or pneumovax:  Immunization History  Administered Date(s) Administered  . PFIZER SARS-COV-2 Vaccination 01/25/2020, 02/15/2020  . Td 06/08/2005    2. H/o DVT w/ BKA Patient with history of 5 DVTs.  She is on chronic anticoagulation with Coumadin 5 mg every other day alternating with 7.5 mg.  No reports of abnormal bleeding cleaning epistaxis, vaginal bleeding or hematuria   Allergies  Allergen Reactions  . Aleve [Naproxen Sodium] Hives and Itching  . Cortisone Swelling    Internal organs  . Prednisone Swelling    Makes internal organs swell  . Vancomycin Itching   Past Medical History:  Diagnosis Date  . COPD (chronic obstructive pulmonary disease) ([place])   . Depression   . DM (diabetes mellitus) ([place])   . DVT (deep venous thrombosis) ([facility])    x5  . Family history of colon cancer   . GERD (gastroesophageal reflux disease)   .  Hyperlipemia   . Hypertension   . Hypothyroid   . Obesity   . Osteomyelitis of great toe of right foot ([place]) 07/04/2018  . PVD (peripheral vascular disease) ([place])   . Unilateral complete BKA, right, initial encounter ([place]) 07/11/2018  . Unilateral complete BKA, right, subsequent encounter ([place])   . Uterine cancer ([place])   . UTI (urinary tract infection)     Current Outpatient Medications:  .  acetaminophen (TYLENOL) 325 MG tablet, Take 1-2 tablets (325-650 mg total) by mouth every 4 (four) hours as needed for mild pain., Disp: , Rfl:  .  aspirin EC 81 MG EC tablet, Take 1 tablet (81 mg total) by mouth daily., Disp: , Rfl:  .  Cholecalciferol (VITAMIN D3) 5000 units TABS, Take 1 tablet by mouth daily., Disp: , Rfl:  .  clopidogrel (PLAVIX) 75 MG tablet, Take 1 tablet (75 mg total) by mouth daily., Disp: 90 tablet, Rfl: 3 .  escitalopram (LEXAPRO) 10 MG tablet, Take 1 tablet (10 mg total) by mouth daily., Disp: 90 tablet, Rfl: 3 .  furosemide (LASIX) 20 MG tablet, Take 1 tablet (20 mg total) by mouth daily., Disp: 90 tablet, Rfl: 3 .  glimepiride (AMARYL) 2 MG tablet, Take 1 tablet (2 mg total) by mouth daily with breakfast., Disp: 90 tablet, Rfl: 3 .  levothyroxine (SYNTHROID) 100 MCG tablet, Take 1 tablet (100 mcg total) by mouth daily before breakfast., Disp: 90  tablet, Rfl: 3 .  losartan (COZAAR) 50 MG tablet, Take 1 tablet (50 mg total) by mouth daily., Disp: 90 tablet, Rfl: 3 .  metFORMIN (GLUCOPHAGE) 1000 MG tablet, Take 1 tablet (1,000 mg total) by mouth 2 (two) times daily with a meal., Disp: 180 tablet, Rfl: 1 .  ONE TOUCH ULTRA TEST test strip, TEST BLOOD SUGARS TWICE A DAY, Disp: 50 each, Rfl: 2 .  pantoprazole (PROTONIX) 40 MG tablet, Take 1 tablet (40 mg total) by mouth daily., Disp: 90 tablet, Rfl: 3 .  Potassium 99 MG TABS, Take 1 tablet by mouth daily., Disp: , Rfl:  .  tretinoin (RETIN-A) 0.05 % cream, Apply topically at bedtime., Disp: 45 g, Rfl: 3 .  triamcinolone cream  (KENALOG) 0.1 %, Apply topically 2 (two) times daily for up to 10 days per flare, Disp: 30 g, Rfl: 0 .  warfarin (COUMADIN) 5 MG tablet, TAKE AS DIRECTED UP TO 1 & 1/2 TABLETS A DAY, Disp: 135 tablet, Rfl: 0 .  fenofibrate (TRICOR) 145 MG tablet, Take 1 tablet (145 mg total) by mouth daily. (Patient not taking: Reported on 03/26/2020), Disp: 90 tablet, Rfl: 3 .  pravastatin (PRAVACHOL) 20 MG tablet, Take 1 tablet (20 mg total) by mouth daily. (Patient not taking: Reported on 03/26/2020), Disp: 90 tablet, Rfl: 3 Social History   Socioeconomic History  . Marital status: Divorced    Spouse name: Kasandra Knudsen  . Number of children: 1  . Years of education: 43  . Highest education level: Master's degree (e.g., MA, MS, MEng, MEd, MSW, MBA)  Occupational History  . Occupation: Product manager: [employer]  . Occupation: retired  Tobacco Use  . Smoking status: Current Every Day Smoker    Packs/day: 0.50    Years: 38.00    Pack years: 19.00    Types: Cigarettes  . Smokeless tobacco: Never Used  Vaping Use  . Vaping Use: Never used  Substance and Sexual Activity  . Alcohol use: No    Alcohol/week: 0.0 standard drinks  . Drug use: Yes    Frequency: 4.0 times per week    Types: Marijuana  . Sexual activity: Not Currently    Birth control/protection: Surgical  Other Topics Concern  . Not on file  Social History Narrative  . Not on file   Social Determinants of Health   Financial Resource Strain:   . Difficulty of Paying Living Expenses: Not on file  Food Insecurity:   . Worried About Charity fundraiser in the Last Year: Not on file  . Ran Out of Food in the Last Year: Not on file  Transportation Needs:   . Lack of Transportation (Medical): Not on file  . Lack of Transportation (Non-Medical): Not on file  Physical Activity:   . Days of Exercise per Week: Not on file  . Minutes of Exercise per Session: Not on file  Stress:   . Feeling of Stress : Not on file  Social  Connections:   . Frequency of Communication with Friends and Family: Not on file  . Frequency of Social Gatherings with Friends and Family: Not on file  . Attends Religious Services: Not on file  . Active Member of Clubs or Organizations: Not on file  . Attends Archivist Meetings: Not on file  . Marital Status: Not on file  Intimate Partner Violence:   . Fear of Current or Ex-Partner: Not on file  . Emotionally Abused: Not on file  .  Physically Abused: Not on file  . Sexually Abused: Not on file   Family History  Problem Relation Age of Onset  . Colon cancer Brother 39  . Hypertension Mother   . Hypothyroidism Mother   . Heart disease Father   . Diabetes Father   . Hyperlipidemia Father   . Clotting disorder Brother   . Diabetes Brother   . Heart disease Brother     Objective: Office vital signs reviewed. BP 124/75   Pulse 83   Temp (!) 97.5 F (36.4 C) (Temporal)   Ht 5\' 10"  (1.778 m)   Wt 241 lb (109.3 kg)   SpO2 96%   BMI 34.58 kg/m   Physical Examination:  General: Awake, alert, obese, No acute distress HEENT: Normal sclera white.  Moist mucous membranes. Cardio: regular rate and rhythm, S1S2 heard, no murmurs appreciated Pulm: clear to auscultation bilaterally, no wheezes, rhonchi or rales; normal work of breathing on room air Extremities: Right lower leg surgically absent.  She has history of BKA MSK: Arrives independently to the office.  She has a right lower extremity prosthetic. Neuro: See diabetic foot exam below  Diabetic Foot Exam - Simple   Simple Foot Form Diabetic Foot exam was performed with the following findings: Yes 03/26/2020  1:41 PM  Visual Inspection See comments: Yes Sensation Testing See comments: Yes Pulse Check See comments: Yes Comments Missing toenails noted on the left lower extremity.  She has no skin breakdown or ulcerative calluses noted.  +1 pedal pulses.  Feet are cool to touch.  Monofilament exam intact on the  left.  Right foot surgically absent     Assessment/ Plan: 59 y.o. female   1. Type 2 diabetes mellitus with diabetic peripheral angiopathy and gangrene, without long-term current use of insulin ([facility]) Not controlled.  A1c at 7.7.  I have increased her dose of Amaryl to 4 mg daily.  Continue Metformin twice daily.  She will follow-up with me in 3 months, sooner if needed.  Additionally, I have recommend that she follow-up with Almyra Free as she is citing that her medications are costing over $300 per month.  This is somewhat confounding given the fact that she is on all generics. - Bayer DCA Hb A1c Waived  2. Hypertension associated with chronic kidney disease due to type 2 diabetes mellitus ([place]) Controlled  3. Hyperlipidemia associated with type 2 diabetes mellitus ([place]) Recommend resuming pravastatin.  Fasting lipid panel obtained - Lipid Panel  4. S/P unilateral BKA (below knee amputation), right ([place])  5. On anticoagulant therapy INR therapeutic at 2.3.  Continue current regimen - CoaguChek XS/INR Waived  6. History of DVT (deep vein thrombosis) - CoaguChek XS/INR Waived  7. Encounter for hepatitis C screening test for low risk patient - Hepatitis C antibody   Orders Placed This Encounter  Procedures  . CoaguChek XS/INR Waived  . Bayer DCA Hb A1c Waived  . Hepatitis C antibody   Meds ordered this encounter  Medications  . glimepiride (AMARYL) 4 MG tablet    Sig: Take 1 tablet (4 mg total) by mouth daily with breakfast.    Dispense:  90 tablet    Refill:  0  . diclofenac Sodium (VOLTAREN) 1 % GEL    Sig: Apply 2 g topically 4 (four) times daily. Prn pain    Dispense:  300 g    Refill:  [place], DO [place] [phone]

## 2020-03-27 ENCOUNTER — Other Ambulatory Visit: Payer: Self-pay | Admitting: Family Medicine

## 2020-03-27 DIAGNOSIS — E1169 Type 2 diabetes mellitus with other specified complication: Secondary | ICD-10-CM

## 2020-03-27 DIAGNOSIS — E785 Hyperlipidemia, unspecified: Secondary | ICD-10-CM

## 2020-03-27 LAB — LIPID PANEL
Chol/HDL Ratio: 8.1 ratio — ABNORMAL HIGH (ref 0.0–4.4)
Cholesterol, Total: 234 mg/dL — ABNORMAL HIGH (ref 100–199)
HDL: 29 mg/dL — ABNORMAL LOW (ref >39)
LDL Chol Calc (NIH): 162 mg/dL — ABNORMAL HIGH (ref 0–99)
Triglycerides: 227 mg/dL — ABNORMAL HIGH (ref 0–149)
VLDL Cholesterol Cal: 43 mg/dL — ABNORMAL HIGH (ref 5–40)

## 2020-03-27 LAB — HEPATITIS C ANTIBODY: Hep C Virus Ab: 0.2 s/co ratio (ref 0.0–0.9)

## 2020-03-27 MED ORDER — PRAVASTATIN SODIUM 20 MG PO TABS: 20.0000 mg | ORAL_TABLET | Freq: Every day | ORAL | 3 refills | Status: DC

## 2020-03-28 ENCOUNTER — Encounter: Payer: Self-pay | Admitting: Family Medicine

## 2020-06-12 ENCOUNTER — Ambulatory Visit (INDEPENDENT_AMBULATORY_CARE_PROVIDER_SITE_OTHER): Payer: Medicare HMO | Admitting: *Deleted

## 2020-06-12 DIAGNOSIS — Z0000 Encounter for general adult medical examination without abnormal findings: Secondary | ICD-10-CM | POA: Diagnosis not present

## 2020-06-12 NOTE — Progress Notes (Signed)
MEDICARE ANNUAL WELLNESS VISIT  06/12/2020  Telephone Visit Disclaimer This Medicare AWV was conducted by telephone due to national recommendations for restrictions regarding the COVID-19 Pandemic (e.g. social distancing).  I verified, using two identifiers, that I am speaking with [name] or their authorized healthcare agent. I discussed the limitations, risks, security, and privacy concerns of performing an evaluation and management service by telephone and the potential availability of an in-person appointment in the future. The patient expressed understanding and agreed to proceed.  Location of Patient: home Location of Provider (nurse):  office  Subjective:    [name] is a 60 y.o. female patient of Janora Norlander, DO who had a Medicare Annual Wellness Visit today via telephone. [name] is Retired and lives alone. she has 1 child. she reports that she is socially active and does interact with friends/family regularly. she is not physically active and enjoys doing [employer] puzzles.  Patient Care Team: Janora Norlander, DO as PCP - General (Family Medicine) [name], MD as Consulting Physician (Orthopedic Surgery)  Advanced Directives 06/12/2020 12/07/2018 11/24/2018 08/16/2018 07/11/2018 07/05/2018 07/23/2015  Does Patient Have a Medical Advance Directive? No No No No No No No  Would patient like information on creating a medical advance directive? No - Patient declined No - Patient declined No - Patient declined - No - Patient declined No - Patient declined -    Hospital Utilization Over the Past 12 Months: # of hospitalizations or ER visits: 0 # of surgeries: 0  Review of Systems    Patient reports that her overall health is worse compared to last year.  History obtained from chart review  Patient Reported Readings (BP, Pulse, CBG, Weight, etc) none  Pain Assessment Pain : No/denies pain     Current Medications & Allergies  (verified) Allergies as of 06/12/2020      Reactions   Aleve [naproxen Sodium] Hives, Itching   Cortisone Swelling   Internal organs   Prednisone Swelling   Makes internal organs swell   Vancomycin Itching      Medication List       Accurate as of June 12, 2020  1:59 PM. If you have any questions, ask your nurse or doctor.        STOP taking these medications   acetaminophen 325 MG tablet Commonly known as: TYLENOL     TAKE these medications   ascorbic acid 500 MG tablet Commonly known as: VITAMIN C Take 500 mg by mouth daily.   aspirin 81 MG EC tablet Take 1 tablet (81 mg total) by mouth daily.   clopidogrel 75 MG tablet Commonly known as: PLAVIX Take 1 tablet (75 mg total) by mouth daily.   diclofenac Sodium 1 % Gel Commonly known as: Voltaren Apply 2 g topically 4 (four) times daily. Prn pain   docusate sodium 50 MG capsule Commonly known as: COLACE Take 50 mg by mouth 2 (two) times daily.   escitalopram 10 MG tablet Commonly known as: LEXAPRO Take 1 tablet (10 mg total) by mouth daily.   Fish Oil 1000 MG Caps Take 1,000 mg by mouth in the morning, at noon, in the evening, and at bedtime.   furosemide 20 MG tablet Commonly known as: LASIX Take 1 tablet (20 mg total) by mouth daily.   glimepiride 4 MG tablet Commonly known as: AMARYL Take 1 tablet (4 mg total) by mouth daily with breakfast.   levothyroxine 100 MCG tablet Commonly known as:  SYNTHROID Take 1 tablet (100 mcg total) by mouth daily before breakfast.   losartan 50 MG tablet Commonly known as: COZAAR Take 1 tablet (50 mg total) by mouth daily.   metFORMIN 1000 MG tablet Commonly known as: GLUCOPHAGE Take 1 tablet (1,000 mg total) by mouth 2 (two) times daily with a meal.   ONE TOUCH ULTRA TEST test strip Generic drug: glucose blood TEST BLOOD SUGARS TWICE A DAY   pantoprazole 40 MG tablet Commonly known as: PROTONIX Take 1 tablet (40 mg total) by mouth daily.   Potassium 99 MG  Tabs Take 1 tablet by mouth daily.   pravastatin 20 MG tablet Commonly known as: PRAVACHOL Take 1 tablet (20 mg total) by mouth daily.   tretinoin 0.05 % cream Commonly known as: Retin-A Apply topically at bedtime.   triamcinolone 0.1 % Commonly known as: KENALOG Apply topically 2 (two) times daily for up to 10 days per flare   Vitamin D3 125 MCG (5000 UT) Tabs Take 1 tablet by mouth daily.   warfarin 5 MG tablet Commonly known as: COUMADIN Take as directed by the anticoagulation clinic. If you are unsure how to take this medication, talk to your nurse or doctor. Original instructions: TAKE AS DIRECTED UP TO 1 & 1/2 TABLETS A DAY       History (reviewed): Past Medical History:  Diagnosis Date  . COPD (chronic obstructive pulmonary disease) ([place])   . Depression   . DM (diabetes mellitus) ([place] [facility])   . DVT (deep venous thrombosis) ([facility])    x5  . Family history of colon cancer   . GERD (gastroesophageal reflux disease)   . Hyperlipemia   . Hypertension   . Hypothyroid   . Obesity   . Osteomyelitis of great toe of right foot ([place] [place]) 07/04/2018  . PVD (peripheral vascular disease) ([place])   . Unilateral complete BKA, right, initial encounter ([place]) 07/11/2018  . Unilateral complete BKA, right, subsequent encounter ([place])   . Uterine cancer ([place])   . UTI (urinary tract infection)    Past Surgical History:  Procedure Laterality Date  . ABDOMINAL HYSTERECTOMY    . AMPUTATION Right 07/08/2018   Procedure: RIGHT BELOW KNEE AMPUTATION;  Surgeon: [name], MD;  Location: [facility];  Service: Orthopedics;  Laterality: Right;  . CESAREAN SECTION    . FEMORAL BYPASS     x 5  . LUMBAR DISC SURGERY     L4-L5  . TOE AMPUTATION     right  . TONSILLECTOMY     Family History  Problem Relation Age of Onset  . Colon cancer Brother 67  . Hypertension Mother   . Hypothyroidism Mother   . Heart disease Father   . Diabetes Father   . Hyperlipidemia Father   . Clotting disorder Brother    . Diabetes Brother   . Heart disease Brother    Social History   Socioeconomic History  . Marital status: Divorced    Spouse name: Kasandra Knudsen  . Number of children: 1  . Years of education: 46  . Highest education level: Master's degree (e.g., MA, MS, MEng, MEd, MSW, MBA)  Occupational History  . Occupation: Product manager: [employer]  . Occupation: retired  Tobacco Use  . Smoking status: Current Every Day Smoker    Packs/day: 1.00    Years: 38.00    Pack years: 38.00    Types: Cigarettes  . Smokeless tobacco: Never Used  Vaping Use  . Vaping Use: Never  used  Substance and Sexual Activity  . Alcohol use: No    Alcohol/week: 0.0 standard drinks  . Drug use: Yes    Frequency: 4.0 times per week    Types: Marijuana  . Sexual activity: Not Currently    Birth control/protection: Surgical  Other Topics Concern  . Not on file  Social History Narrative  . Not on file   Social Determinants of Health   Financial Resource Strain: Low Risk   . Difficulty of Paying Living Expenses: Not hard at all  Food Insecurity: No Food Insecurity  . Worried About Programme researcher, broadcasting/film/video in the Last Year: Never true  . Ran Out of Food in the Last Year: Never true  Transportation Needs: No Transportation Needs  . Lack of Transportation (Medical): No  . Lack of Transportation (Non-Medical): No  Physical Activity: Inactive  . Days of Exercise per Week: 0 days  . Minutes of Exercise per Session: 0 min  Stress: No Stress Concern Present  . Feeling of Stress : Not at all  Social Connections: Moderately Integrated  . Frequency of Communication with Friends and Family: More than three times a week  . Frequency of Social Gatherings with Friends and Family: More than three times a week  . Attends Religious Services: More than 4 times per year  . Active Member of Clubs or Organizations: Yes  . Attends Banker Meetings: More than 4 times per year  . Marital Status: Divorced     Activities of Daily Living In your present state of health, do you have any difficulty performing the following activities: 06/12/2020  Hearing? N  Vision? N  Difficulty concentrating or making decisions? N  Walking or climbing stairs? Y  Comment due to her BKA  Dressing or bathing? N  Doing errands, shopping? N  Preparing Food and eating ? N  Using the Toilet? Y  Comment due to the BKA of right leg  In the past six months, have you accidently leaked urine? N  Do you have problems with loss of bowel control? N  Managing your Medications? Y  Comment has trouble remembering to take her medications-she does have her medications that come pre-packed by the pharmacy  Managing your Finances? N  Housekeeping or managing your Housekeeping? Y  Comment has someone come in and clean twice a week  Some recent data might be hidden    Patient Education/ Literacy How often do you need to have someone help you when you read instructions, pamphlets, or other written materials from your doctor or pharmacy?: 1 - Never What is the last grade level you completed in school?: Masters Degree-Adult Education  Exercise Current Exercise Habits: The patient does not participate in regular exercise at present, Exercise limited by: orthopedic condition(s);respiratory conditions(s)  Diet Patient reports consuming 2 meals a day and 1 snack(s) a day Patient reports that her primary diet is: Regular Patient reports that she does have regular access to food.   Depression Screen PHQ 2/9 Scores 06/12/2020 03/26/2020 12/27/2019 08/16/2019 07/03/2019 07/03/2019 03/29/2019  PHQ - 2 Score 0 0 0 0 4 1 4   PHQ- 9 Score - 0 0 0 10 - 8     Fall Risk Fall Risk  06/12/2020 07/03/2019 03/29/2019 12/27/2018 11/24/2018  Falls in the past year? 1 0 0 0 0  Number falls in past yr: 1 - - - -  Injury with Fall? 0 - - - -  Risk for fall due to : History  of fall(s) - - - Impaired mobility  Follow up Falls evaluation completed - - - -      Objective:  [name] seemed alert and oriented and she participated appropriately during our telephone visit.  Blood Pressure Weight BMI  BP Readings from Last 3 Encounters:  03/26/20 124/75  12/27/19 106/61  08/16/19 136/77   Wt Readings from Last 3 Encounters:  03/26/20 241 lb (109.3 kg)  12/27/19 235 lb (106.6 kg)  11/13/19 242 lb (109.8 kg)   BMI Readings from Last 1 Encounters:  03/26/20 34.58 kg/m    *Unable to obtain current vital signs, weight, and BMI due to telephone visit type  Hearing/Vision  . [place] did not seem to have difficulty with hearing/understanding during the telephone conversation . Reports that she has not had a formal eye exam by an eye care professional within the past year . Reports that she has not had a formal hearing evaluation within the past year *Unable to fully assess hearing and vision during telephone visit type  Cognitive Function: 6CIT Screen 06/12/2020 11/24/2018  What Year? 0 points 0 points  What month? 0 points 0 points  What time? 0 points 0 points  Count back from 20 0 points 0 points  Months in reverse 0 points 0 points  Repeat phrase 0 points 0 points  Total Score 0 0   (Normal:0-7, Significant for Dysfunction: >8)  Normal Cognitive Function Screening: Yes   Immunization & Health Maintenance Record Immunization History  Administered Date(s) Administered  . PFIZER SARS-COV-2 Vaccination 01/25/2020, 02/15/2020  . Td 06/08/2005    Health Maintenance  Topic Date Due  . MAMMOGRAM  08/06/2012  . COLONOSCOPY (Pts 45-42yrs Insurance coverage will need to be confirmed)  07/01/2017  . OPHTHALMOLOGY EXAM  05/25/2019  . COVID-19 Vaccine (3 - Pfizer risk 4-dose series) 03/14/2020  . PNEUMOCOCCAL POLYSACCHARIDE VACCINE AGE 57-64 HIGH RISK  07/02/2020 (Originally 07/20/1962)  . TETANUS/TDAP  03/26/2021 (Originally 06/09/2015)  . HEMOGLOBIN A1C  09/24/2020  . FOOT EXAM  03/26/2021  . Hepatitis C Screening  Completed   . HIV Screening  Completed  . INFLUENZA VACCINE  Discontinued  . PAP SMEAR-Modifier  Discontinued       Assessment  This is a routine wellness examination for [name].  Health Maintenance: Due or Overdue Health Maintenance Due  Topic Date Due  . MAMMOGRAM  08/06/2012  . COLONOSCOPY (Pts 45-60yrs Insurance coverage will need to be confirmed)  07/01/2017  . OPHTHALMOLOGY EXAM  05/25/2019  . COVID-19 Vaccine (3 - Pfizer risk 4-dose series) 03/14/2020    [name] does not need a referral for Community Assistance: Care Management:   no Social Work:    no Prescription Assistance:  no Nutrition/Diabetes Education:  no   Plan:  Personalized Goals Goals Addressed              This Visit's Progress   .  Weight (lb) < 230 lb (104.3 kg) (pt-stated)        Personalized Health Maintenance & Screening Recommendations  Influenza vaccine Td vaccine Shingrix vaccine  Lung Cancer Screening Recommended: no (Low Dose CT Chest recommended if Age 1-80 years, 30 pack-year currently smoking OR have quit w/in past 15 years) Hepatitis C Screening recommended: no HIV Screening recommended: no  Advanced Directives: Written information was not prepared per patient's request.  Referrals & Orders No orders of the defined types were placed in this encounter.   Follow-up Plan . Follow-up with Lajuana Ripple,  Ashly M, DO as planned . Schedule your Diabetic Eye Exam  . Consider Flu, TDAP and Shingrix vaccines   I have personally reviewed and noted the following in the patient's chart:   . Medical and social history . Use of alcohol, tobacco or illicit drugs  . Current medications and supplements . Functional ability and status . Nutritional status . Physical activity . Advanced directives . List of other physicians . Hospitalizations, surgeries, and ER visits in previous 12 months . Vitals . Screenings to include cognitive, depression, and  falls . Referrals and appointments  In addition, I have reviewed and discussed with [name] certain preventive protocols, quality metrics, and best practice recommendations. A written personalized care plan for preventive services as well as general preventive health recommendations is available and can be mailed to the patient at her request.      Milas Hock, LPN  [id number]

## 2020-06-12 NOTE — Patient Instructions (Signed)
  Ms. [name] , Thank you for taking time to come for your Medicare Wellness Visit. I appreciate your ongoing commitment to your health goals. Please review the following plan we discussed and let me know if I can assist you in the future.   These are the goals we discussed: Goals    .  Increase physical activity (pt-stated)      "I want to get to walking again after my BKA of the right leg"    .  Weight (lb) < 230 lb (104.3 kg) (pt-stated)       This is a list of the screening recommended for you and due dates:  Health Maintenance  Topic Date Due  . Mammogram  08/06/2012  . Colon Cancer Screening  07/01/2017  . Eye exam for diabetics  05/25/2019  . COVID-19 Vaccine (3 - Pfizer risk 4-dose series) 03/14/2020  . Pneumococcal vaccine  07/02/2020*  . Tetanus Vaccine  03/26/2021*  . Hemoglobin A1C  09/24/2020  . Complete foot exam   03/26/2021  .  Hepatitis C: One time screening is recommended by Center for Disease Control  (CDC) for  adults born from [birth year] through 1963.   Completed  . HIV Screening  Completed  . Flu Shot  Discontinued  . Pap Smear  Discontinued  *Topic was postponed. The date shown is not the original due date.

## 2020-06-14 DIAGNOSIS — L03031 Cellulitis of right toe: Secondary | ICD-10-CM | POA: Diagnosis not present

## 2020-06-26 ENCOUNTER — Other Ambulatory Visit: Payer: Self-pay

## 2020-06-26 ENCOUNTER — Ambulatory Visit (INDEPENDENT_AMBULATORY_CARE_PROVIDER_SITE_OTHER): Payer: Medicare HMO | Admitting: Family Medicine

## 2020-06-26 VITALS — BP 135/80 | HR 87 | Temp 98.1°F | Ht 70.0 in | Wt 251.0 lb

## 2020-06-26 DIAGNOSIS — W19XXXA Unspecified fall, initial encounter: Secondary | ICD-10-CM

## 2020-06-26 DIAGNOSIS — E1152 Type 2 diabetes mellitus with diabetic peripheral angiopathy with gangrene: Secondary | ICD-10-CM

## 2020-06-26 DIAGNOSIS — G720 Drug-induced myopathy: Secondary | ICD-10-CM

## 2020-06-26 DIAGNOSIS — E1159 Type 2 diabetes mellitus with other circulatory complications: Secondary | ICD-10-CM | POA: Diagnosis not present

## 2020-06-26 DIAGNOSIS — Z89511 Acquired absence of right leg below knee: Secondary | ICD-10-CM | POA: Diagnosis not present

## 2020-06-26 DIAGNOSIS — Z7901 Long term (current) use of anticoagulants: Secondary | ICD-10-CM

## 2020-06-26 DIAGNOSIS — E039 Hypothyroidism, unspecified: Secondary | ICD-10-CM | POA: Diagnosis not present

## 2020-06-26 DIAGNOSIS — I152 Hypertension secondary to endocrine disorders: Secondary | ICD-10-CM | POA: Diagnosis not present

## 2020-06-26 DIAGNOSIS — T466X5A Adverse effect of antihyperlipidemic and antiarteriosclerotic drugs, initial encounter: Secondary | ICD-10-CM

## 2020-06-26 LAB — COAGUCHEK XS/INR WAIVED
INR: 1.4 — ABNORMAL HIGH (ref 0.9–1.1)
Prothrombin Time: 17 s

## 2020-06-26 LAB — BAYER DCA HB A1C WAIVED: HB A1C (BAYER DCA - WAIVED): 9.2 % — ABNORMAL HIGH (ref <7.0)

## 2020-06-26 MED ORDER — ACCU-CHEK SOFTCLIX LANCETS MISC: 12 refills | Status: DC

## 2020-06-26 MED ORDER — LINAGLIPTIN 5 MG PO TABS: 5.0000 mg | ORAL_TABLET | Freq: Every day | ORAL | 0 refills | Status: DC

## 2020-06-26 MED ORDER — EZETIMIBE 10 MG PO TABS: 10.0000 mg | ORAL_TABLET | Freq: Every day | ORAL | 3 refills | Status: DC

## 2020-06-26 MED ORDER — GLUCOSE BLOOD VI STRP: ORAL_STRIP | 12 refills | Status: DC

## 2020-06-26 NOTE — Patient Instructions (Signed)
2 tablets of coumadin today and then take 1 tablet daily. Recheck in 1 week  Start Tradjenta today.  Continue Metformin and Glimeperide  Dietician to call

## 2020-06-26 NOTE — Progress Notes (Signed)
Subjective: CC: DM PCP: Janora Norlander, DO ZES:PQZRAQ ELLI GROESBECK is a 60 y.o. female presenting to clinic today for:  1. Type 2 Diabetes with hypertension, hyperlipidemia:  Not checking blood sugar at all. She admits that she has been eating sweets daily because she "craves them". She does admit to feeling tired. She is compliant with Amaryl, which was increased last visit and her metformin. Compliant with her Plavix, Lasix and Cozaar. Not treated with a statin due to pain in her arm, particularly right arm.  Last eye exam: Needs Last foot exam: Up-to-date. History of right BKA Last A1c:  Lab Results  Component Value Date   HGBA1C 7.7 (H) 03/26/2020   Nephropathy screen indicated?: On ARB Last flu, zoster and/or pneumovax:  Immunization History  Administered Date(s) Administered  . PFIZER(Purple Top)SARS-COV-2 Vaccination 01/25/2020, 02/15/2020  . Td 06/08/2005    2. fall Patient reports that she sustained a fall on Sunday. She hit her right hip. She reports some bruising in that area and would like me to look at it today  3. toe laceration Patient reports she sustained a left toe laceration. She sought care in the urgent care center and was placed on Silvadene cream. Lesion is healing. No exquisite tenderness or drainage noted  4. chronic anticoagulation Patient is on chronic anticoagulation with Coumadin. She takes 1 daily. No bleeding episodes. She does have bruising as above after fall.  ROS: Per HPI  Allergies  Allergen Reactions  . Aleve [Naproxen Sodium] Hives and Itching  . Cortisone Swelling    Internal organs  . Prednisone Swelling    Makes internal organs swell  . Vancomycin Itching   Past Medical History:  Diagnosis Date  . COPD (chronic obstructive pulmonary disease) ([place])   . Depression   . DM (diabetes mellitus) ([place])   . DVT (deep venous thrombosis) (HCC)    x5  . Family history of colon cancer   . GERD (gastroesophageal reflux disease)    . Hyperlipemia   . Hypertension   . Hypothyroid   . Obesity   . Osteomyelitis of great toe of right foot ([place]) 07/04/2018  . PVD (peripheral vascular disease) ([place])   . Unilateral complete BKA, right, initial encounter ([place]) 07/11/2018  . Unilateral complete BKA, right, subsequent encounter ([place])   . Uterine cancer ([place])   . UTI (urinary tract infection)     Current Outpatient Medications:  .  ascorbic acid (VITAMIN C) 500 MG tablet, Take 500 mg by mouth daily., Disp: , Rfl:  .  aspirin EC 81 MG EC tablet, Take 1 tablet (81 mg total) by mouth daily., Disp: , Rfl:  .  Cholecalciferol (VITAMIN D3) 5000 units TABS, Take 1 tablet by mouth daily., Disp: , Rfl:  .  clopidogrel (PLAVIX) 75 MG tablet, Take 1 tablet (75 mg total) by mouth daily., Disp: 90 tablet, Rfl: 3 .  diclofenac Sodium (VOLTAREN) 1 % GEL, Apply 2 g topically 4 (four) times daily. Prn pain, Disp: 300 g, Rfl: 1 .  docusate sodium (COLACE) 50 MG capsule, Take 50 mg by mouth 2 (two) times daily., Disp: , Rfl:  .  escitalopram (LEXAPRO) 10 MG tablet, Take 1 tablet (10 mg total) by mouth daily., Disp: 90 tablet, Rfl: 3 .  furosemide (LASIX) 20 MG tablet, Take 1 tablet (20 mg total) by mouth daily., Disp: 90 tablet, Rfl: 3 .  glimepiride (AMARYL) 4 MG tablet, Take 1 tablet (4 mg total) by mouth daily with breakfast., Disp: 90  tablet, Rfl: 0 .  levothyroxine (SYNTHROID) 100 MCG tablet, Take 1 tablet (100 mcg total) by mouth daily before breakfast., Disp: 90 tablet, Rfl: 3 .  losartan (COZAAR) 50 MG tablet, Take 1 tablet (50 mg total) by mouth daily., Disp: 90 tablet, Rfl: 3 .  metFORMIN (GLUCOPHAGE) 1000 MG tablet, Take 1 tablet (1,000 mg total) by mouth 2 (two) times daily with a meal., Disp: 180 tablet, Rfl: 1 .  Omega-3 Fatty Acids (FISH OIL) 1000 MG CAPS, Take 1,000 mg by mouth in the morning, at noon, in the evening, and at bedtime., Disp: , Rfl:  .  ONE TOUCH ULTRA TEST test strip, TEST BLOOD SUGARS TWICE A DAY, Disp: 50 each,  Rfl: 2 .  pantoprazole (PROTONIX) 40 MG tablet, Take 1 tablet (40 mg total) by mouth daily., Disp: 90 tablet, Rfl: 3 .  Potassium 99 MG TABS, Take 1 tablet by mouth daily., Disp: , Rfl:  .  pravastatin (PRAVACHOL) 20 MG tablet, Take 1 tablet (20 mg total) by mouth daily., Disp: 90 tablet, Rfl: 3 .  tretinoin (RETIN-A) 0.05 % cream, Apply topically at bedtime., Disp: 45 g, Rfl: 3 .  triamcinolone cream (KENALOG) 0.1 %, Apply topically 2 (two) times daily for up to 10 days per flare, Disp: 30 g, Rfl: 0 .  warfarin (COUMADIN) 5 MG tablet, TAKE AS DIRECTED UP TO 1 & 1/2 TABLETS A DAY, Disp: 135 tablet, Rfl: 0 Social History   Socioeconomic History  . Marital status: Divorced    Spouse name: Kasandra Knudsen  . Number of children: 1  . Years of education: 54  . Highest education level: Master's degree (e.g., MA, MS, MEng, MEd, MSW, MBA)  Occupational History  . Occupation: Product manager: [employer]  . Occupation: retired  Tobacco Use  . Smoking status: Current Every Day Smoker    Packs/day: 1.00    Years: 38.00    Pack years: 38.00    Types: Cigarettes  . Smokeless tobacco: Never Used  Vaping Use  . Vaping Use: Never used  Substance and Sexual Activity  . Alcohol use: No    Alcohol/week: 0.0 standard drinks  . Drug use: Yes    Frequency: 4.0 times per week    Types: Marijuana  . Sexual activity: Not Currently    Birth control/protection: Surgical  Other Topics Concern  . Not on file  Social History Narrative  . Not on file   Social Determinants of Health   Financial Resource Strain: Low Risk   . Difficulty of Paying Living Expenses: Not hard at all  Food Insecurity: No Food Insecurity  . Worried About Charity fundraiser in the Last Year: Never true  . Ran Out of Food in the Last Year: Never true  Transportation Needs: No Transportation Needs  . Lack of Transportation (Medical): No  . Lack of Transportation (Non-Medical): No  Physical Activity: Inactive  . Days of  Exercise per Week: 0 days  . Minutes of Exercise per Session: 0 min  Stress: No Stress Concern Present  . Feeling of Stress : Not at all  Social Connections: Moderately Integrated  . Frequency of Communication with Friends and Family: More than three times a week  . Frequency of Social Gatherings with Friends and Family: More than three times a week  . Attends Religious Services: More than 4 times per year  . Active Member of Clubs or Organizations: Yes  . Attends Archivist Meetings: More than 4 times  per year  . Marital Status: Divorced  Human resources officer Violence: Not At Risk  . Fear of Current or Ex-Partner: No  . Emotionally Abused: No  . Physically Abused: No  . Sexually Abused: No   Family History  Problem Relation Age of Onset  . Colon cancer Brother 65  . Hypertension Mother   . Hypothyroidism Mother   . Heart disease Father   . Diabetes Father   . Hyperlipidemia Father   . Clotting disorder Brother   . Diabetes Brother   . Heart disease Brother     Objective: Office vital signs reviewed. BP 135/80   Pulse 87   Temp 98.1 F (36.7 C) (Temporal)   Ht 5' 10"  (1.778 m)   Wt 251 lb (113.9 kg)   SpO2 96%   BMI 36.01 kg/m   Physical Examination:  General: Awake, alert, obese, No acute distress HEENT: No exophthalmos. No goiter Cardio: regular rate and rhythm, S1S2 heard, no murmurs appreciated Pulm: clear to auscultation bilaterally, no wheezes, rhonchi or rales; normal work of breathing on room air Extremities: Right BKA noted with prosthetic in place. Left great toe with a healing laceration noted laterally. No exudate or evidence of secondary infection MSK: Slow gait. Normal station. Ambulates independently Skin: As above; has healing area of ecchymosis/mild hematoma noted along the right lateral hip. No ulceration or skin breakdown appreciated  Assessment/ Plan: 60 y.o. female   Type 2 diabetes mellitus with diabetic peripheral angiopathy and  gangrene, without long-term current use of insulin (HCC) - Plan: Bayer DCA Hb A1c Waived, Lipid Panel, CMP14+EGFR, linagliptin (TRADJENTA) 5 MG TABS tablet, Amb ref to Medical Nutrition Therapy-MNT, ezetimibe (ZETIA) 10 MG tablet, glucose blood test strip, Accu-Chek Softclix Lancets lancets  Statin myopathy  Hypertension associated with diabetes ([place])  S/P unilateral BKA (below knee amputation), right (HCC)  On anticoagulant therapy - Plan: CBC, CoaguChek XS/INR Waived  Acquired hypothyroidism - Plan: Thyroid Panel With TSH  Fall, initial encounter  Sugars uncontrolled with a rapid rise in her blood sugar to A1c of greater than 9 today. We had a very frank conversation today about the risks of uncontrolled blood sugar. She already has history of amputation of the right lower extremity. I do worry about progression of her disease and the risk of cardiovascular events. I have advised her to cut out all carbs. Referral to dietitian has been placed. I am putting her on Tradjenta 5 mg daily I would like to see her back in about 4 weeks for blood sugar review. A new meter was provided to the patient today. Testing supplies has been sent to the pharmacy. Advised her to at least check her blood sugar every morning prior to breakfast and record. She is very motivated to stay away from any type of needles so hopefully this will be a good turning point for her  Unfortunately, she was not able to tolerate pravastatin even at low-dose and therefore I placed her on Zetia. Anticipate need for something like Vascepa and/or Nexletol. We do have to be cost conscious for this patient.  Blood pressures well controlled. Continue current regimen  INR is subtherapeutic. Advised her to go to 2 tablets today then resume 1 tablet daily thereafter. Plan to see her back in the next 1 to 2 weeks for recheck  Lesion on hip appears to be a small hematoma but does not have any red flag signs or symptoms associated. Advised to  use ice on the affected area.  Check  thyroid panel along with rest of labs today.    Orders Placed This Encounter  Procedures  . Bayer DCA Hb A1c Waived  . Lipid Panel  . CMP14+EGFR  . Thyroid Panel With TSH  . CBC  . CoaguChek XS/INR Waived  . Amb ref to Medical Nutrition Therapy-MNT    Referral Priority:   Routine    Referral Type:   Consultation    Referral Reason:   Specialty Services Required    Requested Specialty:   Nutrition    Number of Visits Requested:   1   Meds ordered this encounter  Medications  . linagliptin (TRADJENTA) 5 MG TABS tablet    Sig: Take 1 tablet (5 mg total) by mouth daily.    Dispense:  28 tablet    Refill:  0  . ezetimibe (ZETIA) 10 MG tablet    Sig: Take 1 tablet (10 mg total) by mouth daily.    Dispense:  90 tablet    Refill:  3  . glucose blood test strip    Sig: accucheck E11.52; Test BGs 1-2 times daily as directed    Dispense:  100 each    Refill:  12  . Accu-Chek Softclix Lancets lancets    Sig: Use as instructed to test BGs 1-2 times daily E11.52    Dispense:  100 each    Refill:  [place], DO [place] [phone]

## 2020-06-27 LAB — THYROID PANEL WITH TSH
Free Thyroxine Index: 2 (ref 1.2–4.9)
T3 Uptake Ratio: 27 % (ref 24–39)
T4, Total: 7.4 ug/dL (ref 4.5–12.0)
TSH: 5.43 u[IU]/mL — ABNORMAL HIGH (ref 0.450–4.500)

## 2020-06-27 LAB — CBC
Hematocrit: 44 % (ref 34.0–46.6)
Hemoglobin: 14.7 g/dL (ref 11.1–15.9)
MCH: 28.9 pg (ref 26.6–33.0)
MCHC: 33.4 g/dL (ref 31.5–35.7)
MCV: 87 fL (ref 79–97)
Platelets: 273 10*3/uL (ref 150–450)
RBC: 5.08 x10E6/uL (ref 3.77–5.28)
RDW: 14.6 % (ref 11.7–15.4)
WBC: 11.5 10*3/uL — ABNORMAL HIGH (ref 3.4–10.8)

## 2020-06-27 LAB — CMP14+EGFR
ALT: 35 IU/L — ABNORMAL HIGH (ref 0–32)
AST: 27 IU/L (ref 0–40)
Albumin/Globulin Ratio: 1.2 (ref 1.2–2.2)
Albumin: 3.7 g/dL — ABNORMAL LOW (ref 3.8–4.9)
Alkaline Phosphatase: 59 IU/L (ref 44–121)
BUN/Creatinine Ratio: 13 (ref 9–23)
BUN: 10 mg/dL (ref 6–24)
Bilirubin Total: 0.3 mg/dL (ref 0.0–1.2)
CO2: 22 mmol/L (ref 20–29)
Calcium: 9.3 mg/dL (ref 8.7–10.2)
Chloride: 102 mmol/L (ref 96–106)
Creatinine, Ser: 0.75 mg/dL (ref 0.57–1.00)
GFR calc Af Amer: 102 mL/min/{1.73_m2} (ref >59)
GFR calc non Af Amer: 89 mL/min/{1.73_m2} (ref >59)
Globulin, Total: 3.2 g/dL (ref 1.5–4.5)
Glucose: 264 mg/dL — ABNORMAL HIGH (ref 65–99)
Potassium: 4.5 mmol/L (ref 3.5–5.2)
Sodium: 138 mmol/L (ref 134–144)
Total Protein: 6.9 g/dL (ref 6.0–8.5)

## 2020-06-27 LAB — LIPID PANEL
Chol/HDL Ratio: 7.9 ratio — ABNORMAL HIGH (ref 0.0–4.4)
Cholesterol, Total: 228 mg/dL — ABNORMAL HIGH (ref 100–199)
HDL: 29 mg/dL — ABNORMAL LOW (ref >39)
LDL Chol Calc (NIH): 153 mg/dL — ABNORMAL HIGH (ref 0–99)
Triglycerides: 249 mg/dL — ABNORMAL HIGH (ref 0–149)
VLDL Cholesterol Cal: 46 mg/dL — ABNORMAL HIGH (ref 5–40)

## 2020-06-28 ENCOUNTER — Other Ambulatory Visit: Payer: Self-pay

## 2020-06-28 DIAGNOSIS — D72829 Elevated white blood cell count, unspecified: Secondary | ICD-10-CM

## 2020-07-03 ENCOUNTER — Ambulatory Visit (INDEPENDENT_AMBULATORY_CARE_PROVIDER_SITE_OTHER): Payer: Medicare HMO | Admitting: Family Medicine

## 2020-07-03 ENCOUNTER — Other Ambulatory Visit: Payer: Self-pay

## 2020-07-03 VITALS — BP 123/69 | HR 63 | Temp 97.5°F | Ht 70.0 in | Wt 249.0 lb

## 2020-07-03 DIAGNOSIS — E1165 Type 2 diabetes mellitus with hyperglycemia: Secondary | ICD-10-CM

## 2020-07-03 DIAGNOSIS — L89899 Pressure ulcer of other site, unspecified stage: Secondary | ICD-10-CM | POA: Diagnosis not present

## 2020-07-03 DIAGNOSIS — T8789 Other complications of amputation stump: Secondary | ICD-10-CM | POA: Diagnosis not present

## 2020-07-03 DIAGNOSIS — Z7901 Long term (current) use of anticoagulants: Secondary | ICD-10-CM | POA: Diagnosis not present

## 2020-07-03 DIAGNOSIS — Z86718 Personal history of other venous thrombosis and embolism: Secondary | ICD-10-CM | POA: Diagnosis not present

## 2020-07-03 DIAGNOSIS — E1152 Type 2 diabetes mellitus with diabetic peripheral angiopathy with gangrene: Secondary | ICD-10-CM

## 2020-07-03 LAB — COAGUCHEK XS/INR WAIVED
INR: 1.8 — ABNORMAL HIGH (ref 0.9–1.1)
Prothrombin Time: 21.7 s

## 2020-07-03 MED ORDER — LINAGLIPTIN 5 MG PO TABS: 5.0000 mg | ORAL_TABLET | Freq: Every day | ORAL | 2 refills | Status: DC

## 2020-07-03 NOTE — Progress Notes (Signed)
Subjective: CC: Follow-up INR PCP: Janora Norlander, DO EPP:IRJJOA Lyman Speller is a 60 y.o. female presenting to clinic today for:  1.  INR follow-up Patient was noted to be subtherapeutic at her last visit.  She was increased to 2 tablets on day of visit and then she was to resume her normal daily dosing of 1.5 tablets on Mondays, Wednesdays and Fridays and 1 tablet daily all other days.  She has not yet taken her dose for today.  Denies any bleeding issues.  2.  Type 2 diabetes, uncontrolled Patient reports that her blood sugars have been running around the 120s to 150s most mornings.  She seems to be tolerating the Tradjenta without difficulty but has found that the Januvia is in fact cheaper on her insurance.  She would like to have this medication combined with the Metformin if possible for ease of administration.  She does report what appears to be an ulcer along the medial aspect of the right knee that has been present for several days now.  She is been placing a Band-Aid over this to protect it.  She is had similar before and this is what she was instructed to do.  Denies any purulence.   ROS: Per HPI  Allergies  Allergen Reactions  . Aleve [Naproxen Sodium] Hives and Itching  . Cortisone Swelling    Internal organs  . Prednisone Swelling    Makes internal organs swell  . Statins Other (See Comments)    Myalgia even with Pravastatin  . Vancomycin Itching   Past Medical History:  Diagnosis Date  . COPD (chronic obstructive pulmonary disease) ([place])   . Depression   . DM (diabetes mellitus) ([place])   . DVT (deep venous thrombosis) (H[facility])    x5  . Family history of colon cancer   . GERD (gastroesophageal reflux disease)   . Hyperlipemia   . Hypertension   . Hypothyroid   . Obesity   . Osteomyelitis of great toe of right foot ([place]) 07/04/2018  . PVD (peripheral vascular disease) ([place])   . Unilateral complete BKA, right, initial encounter ([place]) 07/11/2018  .  Unilateral complete BKA, right, subsequent encounter ([place])   . Uterine cancer ([place])   . UTI (urinary tract infection)     Current Outpatient Medications:  .  Accu-Chek Softclix Lancets lancets, Use as instructed to test BGs 1-2 times daily E11.52, Disp: 100 each, Rfl: 12 .  amoxicillin-clavulanate (AUGMENTIN) 875-125 MG tablet, Take 1 tablet by mouth 2 (two) times daily., Disp: , Rfl:  .  ascorbic acid (VITAMIN C) 500 MG tablet, Take 500 mg by mouth daily., Disp: , Rfl:  .  aspirin EC 81 MG EC tablet, Take 1 tablet (81 mg total) by mouth daily., Disp: , Rfl:  .  Cholecalciferol (VITAMIN D3) 5000 units TABS, Take 1 tablet by mouth daily., Disp: , Rfl:  .  clopidogrel (PLAVIX) 75 MG tablet, Take 1 tablet (75 mg total) by mouth daily., Disp: 90 tablet, Rfl: 3 .  diclofenac Sodium (VOLTAREN) 1 % GEL, Apply 2 g topically 4 (four) times daily. Prn pain, Disp: 300 g, Rfl: 1 .  docusate sodium (COLACE) 50 MG capsule, Take 50 mg by mouth 2 (two) times daily., Disp: , Rfl:  .  escitalopram (LEXAPRO) 10 MG tablet, Take 1 tablet (10 mg total) by mouth daily., Disp: 90 tablet, Rfl: 3 .  ezetimibe (ZETIA) 10 MG tablet, Take 1 tablet (10 mg total) by mouth daily., Disp: 90 tablet, Rfl:  3 .  furosemide (LASIX) 20 MG tablet, Take 1 tablet (20 mg total) by mouth daily., Disp: 90 tablet, Rfl: 3 .  glimepiride (AMARYL) 4 MG tablet, Take 1 tablet (4 mg total) by mouth daily with breakfast., Disp: 90 tablet, Rfl: 0 .  glucose blood test strip, accucheck E11.52; Test BGs 1-2 times daily as directed, Disp: 100 each, Rfl: 12 .  levothyroxine (SYNTHROID) 100 MCG tablet, Take 1 tablet (100 mcg total) by mouth daily before breakfast., Disp: 90 tablet, Rfl: 3 .  linagliptin (TRADJENTA) 5 MG TABS tablet, Take 1 tablet (5 mg total) by mouth daily., Disp: 28 tablet, Rfl: 0 .  losartan (COZAAR) 50 MG tablet, Take 1 tablet (50 mg total) by mouth daily., Disp: 90 tablet, Rfl: 3 .  metFORMIN (GLUCOPHAGE) 1000 MG tablet, Take 1  tablet (1,000 mg total) by mouth 2 (two) times daily with a meal., Disp: 180 tablet, Rfl: 1 .  Omega-3 Fatty Acids (FISH OIL) 1000 MG CAPS, Take 1,000 mg by mouth in the morning, at noon, in the evening, and at bedtime., Disp: , Rfl:  .  pantoprazole (PROTONIX) 40 MG tablet, Take 1 tablet (40 mg total) by mouth daily., Disp: 90 tablet, Rfl: 3 .  Potassium 99 MG TABS, Take 1 tablet by mouth daily., Disp: , Rfl:  .  tretinoin (RETIN-A) 0.05 % cream, Apply topically at bedtime., Disp: 45 g, Rfl: 3 .  triamcinolone cream (KENALOG) 0.1 %, Apply topically 2 (two) times daily for up to 10 days per flare, Disp: 30 g, Rfl: 0 .  warfarin (COUMADIN) 5 MG tablet, TAKE AS DIRECTED UP TO 1 & 1/2 TABLETS A DAY, Disp: 135 tablet, Rfl: 0 Social History   Socioeconomic History  . Marital status: Divorced    Spouse name: Kasandra Knudsen  . Number of children: 1  . Years of education: 30  . Highest education level: Master's degree (e.g., MA, MS, MEng, MEd, MSW, MBA)  Occupational History  . Occupation: Product manager: [employer]  . Occupation: retired  Tobacco Use  . Smoking status: Current Every Day Smoker    Packs/day: 1.00    Years: 38.00    Pack years: 38.00    Types: Cigarettes  . Smokeless tobacco: Never Used  Vaping Use  . Vaping Use: Never used  Substance and Sexual Activity  . Alcohol use: No    Alcohol/week: 0.0 standard drinks  . Drug use: Yes    Frequency: 4.0 times per week    Types: Marijuana  . Sexual activity: Not Currently    Birth control/protection: Surgical  Other Topics Concern  . Not on file  Social History Narrative  . Not on file   Social Determinants of Health   Financial Resource Strain: Low Risk   . Difficulty of Paying Living Expenses: Not hard at all  Food Insecurity: No Food Insecurity  . Worried About Charity fundraiser in the Last Year: Never true  . Ran Out of Food in the Last Year: Never true  Transportation Needs: No Transportation Needs  . Lack of  Transportation (Medical): No  . Lack of Transportation (Non-Medical): No  Physical Activity: Inactive  . Days of Exercise per Week: 0 days  . Minutes of Exercise per Session: 0 min  Stress: No Stress Concern Present  . Feeling of Stress : Not at all  Social Connections: Moderately Integrated  . Frequency of Communication with Friends and Family: More than three times a week  . Frequency  of Social Gatherings with Friends and Family: More than three times a week  . Attends Religious Services: More than 4 times per year  . Active Member of Clubs or Organizations: Yes  . Attends Archivist Meetings: More than 4 times per year  . Marital Status: Divorced  Human resources officer Violence: Not At Risk  . Fear of Current or Ex-Partner: No  . Emotionally Abused: No  . Physically Abused: No  . Sexually Abused: No   Family History  Problem Relation Age of Onset  . Colon cancer Brother 3  . Hypertension Mother   . Hypothyroidism Mother   . Heart disease Father   . Diabetes Father   . Hyperlipidemia Father   . Clotting disorder Brother   . Diabetes Brother   . Heart disease Brother     Objective: Office vital signs reviewed. BP 123/69   Pulse 63   Temp (!) 97.5 F (36.4 C) (Temporal)   Ht 5\' 10"  (1.778 m)   Wt 249 lb (112.9 kg)   SpO2 98%   BMI 35.73 kg/m   Physical Examination:  General: Awake, alert, well nourished, No acute distress HEENT: Normal, MMM Cardio: regular rate   Pulm: Normal breathing on room air Skin: There is a shallow area of ulceration noted along the medial aspect of the right knee.  There is no appreciable secondary infection or drainage.  Assessment/ Plan: 60 y.o. female   On anticoagulant therapy - Plan: CoaguChek XS/INR Waived  History of DVT (deep vein thrombosis)  Pressure ulcer of below knee amputation stump (HCC)  Uncontrolled type 2 diabetes mellitus with hyperglycemia (HCC)  INR subtherapeutic at 1.8.  She has not taken today's dose  since medication changes have been made today.  She will continue with the increased medication dose.  She will follow-up with me in 4 to 6 weeks, sooner if needed  She was noted to have what appears to be a pressure ulcer along the medial aspect of her right knee/ stump.  We discussed ways to protect this area and I encouraged her to use some type of barrier.  We discussed red flag signs and symptoms warranting further evaluation.  She was good understanding will follow up as directed as above.  Medication change to Janumet 50-1000 mg twice daily.  She will follow-up at the appropriate interval for repeat A1c and ongoing monitoring of blood sugar.  Encouraged her to continue modifying her diet and increasing physical exercise as tolerated.  Orders Placed This Encounter  Procedures  . CoaguChek XS/INR Waived   Meds ordered this encounter  Medications  . DISCONTD: linagliptin (TRADJENTA) 5 MG TABS tablet    Sig: Take 1 tablet (5 mg total) by mouth daily.    Dispense:  30 tablet    Refill:  2  . DISCONTD: linagliptin (TRADJENTA) 5 MG TABS tablet    Sig: Take 1 tablet (5 mg total) by mouth daily.    Dispense:  30 tablet    Refill:  [place], [place] [phone]

## 2020-07-04 ENCOUNTER — Telehealth: Payer: Self-pay | Admitting: Family Medicine

## 2020-07-04 NOTE — Telephone Encounter (Signed)
Pt calling to let Dr Darnell Level know that [name] is the cheaper option of the two that was discussed

## 2020-07-05 MED ORDER — JANUMET 50-1000 MG PO TABS: 1.0000 | ORAL_TABLET | Freq: Two times a day (BID) | ORAL | 2 refills | Status: DC

## 2020-07-05 NOTE — Telephone Encounter (Signed)
Patient called back and states that she has not picked up tradjenta.  Patient would like combined medication sent to pharmacy

## 2020-07-05 NOTE — Telephone Encounter (Signed)
Patient aware and verbalized understanding. °

## 2020-07-05 NOTE — Telephone Encounter (Signed)
Great. Janumet combo sent.  When she finishes tradjenta samples, switch OFF Metformin.  The Janumet has BOTH MEDICATIONS IN IT.  Med will be twice daily with meals.

## 2020-07-05 NOTE — Telephone Encounter (Signed)
Ok, has she picked up the new rx for Tradjenta yet?  If not, I can replace and combine with Metformin so she only has to take 1 pill.

## 2020-07-18 ENCOUNTER — Encounter: Payer: Self-pay | Admitting: Nutrition

## 2020-07-18 ENCOUNTER — Encounter: Payer: Medicare HMO | Attending: Family Medicine | Admitting: Nutrition

## 2020-07-18 VITALS — Ht 70.0 in | Wt 243.0 lb

## 2020-07-18 DIAGNOSIS — E1152 Type 2 diabetes mellitus with diabetic peripheral angiopathy with gangrene: Secondary | ICD-10-CM | POA: Diagnosis not present

## 2020-07-18 DIAGNOSIS — K449 Diaphragmatic hernia without obstruction or gangrene: Secondary | ICD-10-CM | POA: Diagnosis not present

## 2020-07-18 DIAGNOSIS — E1169 Type 2 diabetes mellitus with other specified complication: Secondary | ICD-10-CM | POA: Diagnosis not present

## 2020-07-18 DIAGNOSIS — E785 Hyperlipidemia, unspecified: Secondary | ICD-10-CM | POA: Insufficient documentation

## 2020-07-18 DIAGNOSIS — Z89511 Acquired absence of right leg below knee: Secondary | ICD-10-CM | POA: Diagnosis not present

## 2020-07-18 DIAGNOSIS — I739 Peripheral vascular disease, unspecified: Secondary | ICD-10-CM | POA: Diagnosis not present

## 2020-07-18 DIAGNOSIS — E669 Obesity, unspecified: Secondary | ICD-10-CM | POA: Insufficient documentation

## 2020-07-18 NOTE — Progress Notes (Signed)
Medical Nutrition Therapy  Appointment Start time:  0800  Appointment End time: 0930  Primary concerns today: Obesity, Type 2 DM  Referral diagnosis: E11.69, E66.01 Preferred learning style: no preference indicated Learning readiness:  change in progress  NUTRITION ASSESSMENT   Anthropometrics  Wt Readings from Last 3 Encounters:  07/18/20 243 lb (110.2 kg)  07/03/20 249 lb (112.9 kg)  06/26/20 251 lb (113.9 kg)   Ht Readings from Last 3 Encounters:  07/18/20 5\' 10"  (1.778 m)  07/03/20 5\' 10"  (1.778 m)  06/26/20 5\' 10"  (1.778 m)   Body mass index is 34.87 kg/m. @BMIFA @ Facility age limit for growth percentiles is 20 years. Facility age limit for growth percentiles is 20 years.   Clinical Medical Hx: BKA R, HTN, Hyperlipidemia, Smoker PVD, Hiatal hernia Medications:Glimperide and Janumet Labs:  Lab Results  Component Value Date   HGBA1C 9.2 (H) 06/26/2020   CMP Latest Ref Rng & Units 06/26/2020 12/27/2019 07/03/2019  Glucose 65 - 99 mg/dL 264(H) 126(H) 98  BUN 6 - 24 mg/dL 10 12 15   Creatinine 0.57 - 1.00 mg/dL 0.75 0.87 0.84  Sodium 134 - 144 mmol/L 138 144 138  Potassium 3.5 - 5.2 mmol/L 4.5 4.2 4.6  Chloride 96 - 106 mmol/L 102 105 104  CO2 20 - 29 mmol/L 22 23 20   Calcium 8.7 - 10.2 mg/dL 9.3 9.6 9.8  Total Protein 6.0 - 8.5 g/dL 6.9 6.8 7.2  Total Bilirubin 0.0 - 1.2 mg/dL 0.3 0.2 <0.2  Alkaline Phos 44 - 121 IU/L 59 51 43  AST 0 - 40 IU/L 27 15 23   ALT 0 - 32 IU/L 35(H) 14 17    Notable Signs/Symptoms: Fatigue, craving sweets, Eats only 1-2 meals per day. Blood sugar high when she doesn't eat. Smoker.   Lifestyle & Dietary Hx Lives by herself. Eats 1-2 meals per day and snacks between.  FBS:102-185  Bedtime 170's-230's Estimated daily fluid intake: 100 oz Supplements: none Sleep: 6-8 hrs  Stress / self-care: Just found out  her best friend is going on hospice due to stopping dialysis,  Current average weekly physical activity: ADL  24-Hr Dietary  Recall First Meal: skipped Snack:  Second Meal: Can [place] beef soup, water Snack:  Third Meal: taco salad with no salad, Snack: SF jello 2 Beverages: water  Estimated Energy Needs Calories: 1200  Carbohydrate: 135g Protein: 90g Fat: 44g   NUTRITION DIAGNOSIS  NB-1.1 Food and nutrition-related knowledge deficit As related to Diabetes Type 2.  As evidenced by A1C 9.4%.   NUTRITION INTERVENTION  Nutrition education (E-1) on the following topics:  . Nutrition and Diabetes education provided on My Plate, CHO counting, meal planning, portion sizes, timing of meals, avoiding snacks between meals unless having a low blood sugar, target ranges for A1C and blood sugars, signs/symptoms and treatment of hyper/hypoglycemia, monitoring blood sugars, taking medications as prescribed, benefits of exercising 30 minutes per day and prevention of complications of DM. Marland Kitchen   Handouts Provided Include   Emailed: My Plate, Diabetes Instructions, Carb counting for people with diabetes. Low Salt Low cholesterol diet, weightloss tips.  Learning Style & Readiness for Change Teaching method utilized:  Visual &Auditory  Demonstrated degree of understanding via: Teach Back  Barriers to learning/adherence to lifestyle change: R BKA  Goals Established by Pt . Follow My Plate . Eat 3 meals per day on time . Cut out snacks . Increase fresh fruits and vegetables. . Eat 2 carb choices (30 g) at each meal, .  Reduce smoking . Get A1C down to 7% . Lose 1 lb per week' . Cut out processed foods.   MONITORING & EVALUATION Dietary intake, weekly physical activity, and blood sugars and weight  in 1 month.  Next Steps  Patient is to keep food journal and BS log.Marland Kitchen

## 2020-07-18 NOTE — Patient Instructions (Signed)
  Goals Established by Pt . Follow My Plate . Eat 3 meals per day on time . Cut out snacks . Increase fresh fruits and vegetables. . Eat 2 carb choices (30 g) at each meal, . Reduce smoking . Get A1C down to 7% . Lose 1 lb per week' . Cut out processed foods.

## 2020-07-19 ENCOUNTER — Other Ambulatory Visit: Payer: Self-pay

## 2020-07-19 ENCOUNTER — Inpatient Hospital Stay (HOSPITAL_COMMUNITY): Payer: Medicare HMO | Attending: Hematology and Oncology | Admitting: Hematology and Oncology

## 2020-07-19 ENCOUNTER — Inpatient Hospital Stay (HOSPITAL_COMMUNITY): Payer: Medicare HMO

## 2020-07-19 ENCOUNTER — Encounter (HOSPITAL_COMMUNITY): Payer: Self-pay | Admitting: Hematology and Oncology

## 2020-07-19 VITALS — BP 131/77 | HR 78 | Temp 98.5°F | Resp 17 | Wt 235.0 lb

## 2020-07-19 DIAGNOSIS — Z89511 Acquired absence of right leg below knee: Secondary | ICD-10-CM | POA: Diagnosis not present

## 2020-07-19 DIAGNOSIS — Z7984 Long term (current) use of oral hypoglycemic drugs: Secondary | ICD-10-CM | POA: Diagnosis not present

## 2020-07-19 DIAGNOSIS — Z86718 Personal history of other venous thrombosis and embolism: Secondary | ICD-10-CM | POA: Insufficient documentation

## 2020-07-19 DIAGNOSIS — I10 Essential (primary) hypertension: Secondary | ICD-10-CM | POA: Diagnosis not present

## 2020-07-19 DIAGNOSIS — Z8349 Family history of other endocrine, nutritional and metabolic diseases: Secondary | ICD-10-CM | POA: Diagnosis not present

## 2020-07-19 DIAGNOSIS — E039 Hypothyroidism, unspecified: Secondary | ICD-10-CM | POA: Insufficient documentation

## 2020-07-19 DIAGNOSIS — Z800 Family history of malignant neoplasm of digestive organs: Secondary | ICD-10-CM | POA: Insufficient documentation

## 2020-07-19 DIAGNOSIS — Z8249 Family history of ischemic heart disease and other diseases of the circulatory system: Secondary | ICD-10-CM | POA: Diagnosis not present

## 2020-07-19 DIAGNOSIS — F32A Depression, unspecified: Secondary | ICD-10-CM | POA: Diagnosis not present

## 2020-07-19 DIAGNOSIS — E785 Hyperlipidemia, unspecified: Secondary | ICD-10-CM | POA: Insufficient documentation

## 2020-07-19 DIAGNOSIS — D72820 Lymphocytosis (symptomatic): Secondary | ICD-10-CM

## 2020-07-19 DIAGNOSIS — J449 Chronic obstructive pulmonary disease, unspecified: Secondary | ICD-10-CM | POA: Insufficient documentation

## 2020-07-19 DIAGNOSIS — Z79899 Other long term (current) drug therapy: Secondary | ICD-10-CM | POA: Diagnosis not present

## 2020-07-19 DIAGNOSIS — K219 Gastro-esophageal reflux disease without esophagitis: Secondary | ICD-10-CM | POA: Diagnosis not present

## 2020-07-19 DIAGNOSIS — D72829 Elevated white blood cell count, unspecified: Secondary | ICD-10-CM | POA: Diagnosis not present

## 2020-07-19 DIAGNOSIS — Z90710 Acquired absence of both cervix and uterus: Secondary | ICD-10-CM | POA: Insufficient documentation

## 2020-07-19 DIAGNOSIS — F17210 Nicotine dependence, cigarettes, uncomplicated: Secondary | ICD-10-CM | POA: Insufficient documentation

## 2020-07-19 DIAGNOSIS — Z8542 Personal history of malignant neoplasm of other parts of uterus: Secondary | ICD-10-CM | POA: Diagnosis not present

## 2020-07-19 DIAGNOSIS — Z833 Family history of diabetes mellitus: Secondary | ICD-10-CM | POA: Insufficient documentation

## 2020-07-19 DIAGNOSIS — Z7901 Long term (current) use of anticoagulants: Secondary | ICD-10-CM | POA: Insufficient documentation

## 2020-07-19 DIAGNOSIS — E669 Obesity, unspecified: Secondary | ICD-10-CM | POA: Diagnosis not present

## 2020-07-19 DIAGNOSIS — E119 Type 2 diabetes mellitus without complications: Secondary | ICD-10-CM | POA: Insufficient documentation

## 2020-07-19 DIAGNOSIS — Z7982 Long term (current) use of aspirin: Secondary | ICD-10-CM | POA: Insufficient documentation

## 2020-07-19 LAB — CBC WITH DIFFERENTIAL/PLATELET
Abs Immature Granulocytes: 0.05 10*3/uL (ref 0.00–0.07)
Basophils Absolute: 0.1 10*3/uL (ref 0.0–0.1)
Basophils Relative: 1 %
Eosinophils Absolute: 0.5 10*3/uL (ref 0.0–0.5)
Eosinophils Relative: 5 %
HCT: 47.7 % — ABNORMAL HIGH (ref 36.0–46.0)
Hemoglobin: 15.5 g/dL — ABNORMAL HIGH (ref 12.0–15.0)
Immature Granulocytes: 1 %
Lymphocytes Relative: 37 %
Lymphs Abs: 3.9 10*3/uL (ref 0.7–4.0)
MCH: 29 pg (ref 26.0–34.0)
MCHC: 32.5 g/dL (ref 30.0–36.0)
MCV: 89.2 fL (ref 80.0–100.0)
Monocytes Absolute: 0.6 10*3/uL (ref 0.1–1.0)
Monocytes Relative: 6 %
Neutro Abs: 5.3 10*3/uL (ref 1.7–7.7)
Neutrophils Relative %: 50 %
Platelets: 267 10*3/uL (ref 150–400)
RBC: 5.35 MIL/uL — ABNORMAL HIGH (ref 3.87–5.11)
RDW: 15.2 % (ref 11.5–15.5)
WBC: 10.5 10*3/uL (ref 4.0–10.5)
nRBC: 0 % (ref 0.0–0.2)

## 2020-07-19 LAB — COMPREHENSIVE METABOLIC PANEL
ALT: 37 U/L (ref 0–44)
AST: 33 U/L (ref 15–41)
Albumin: 3.6 g/dL (ref 3.5–5.0)
Alkaline Phosphatase: 44 U/L (ref 38–126)
Anion gap: 10 (ref 5–15)
BUN: 12 mg/dL (ref 6–20)
CO2: 24 mmol/L (ref 22–32)
Calcium: 9.5 mg/dL (ref 8.9–10.3)
Chloride: 105 mmol/L (ref 98–111)
Creatinine, Ser: 0.66 mg/dL (ref 0.44–1.00)
GFR, Estimated: >60 mL/min (ref >60)
Glucose, Bld: 126 mg/dL — ABNORMAL HIGH (ref 70–99)
Potassium: 4.1 mmol/L (ref 3.5–5.1)
Sodium: 139 mmol/L (ref 135–145)
Total Bilirubin: 0.5 mg/dL (ref 0.3–1.2)
Total Protein: 7.4 g/dL (ref 6.5–8.1)

## 2020-07-19 LAB — LACTATE DEHYDROGENASE: LDH: 91 U/L — ABNORMAL LOW (ref 98–192)

## 2020-07-19 NOTE — Progress Notes (Signed)
[facility] NOTE  Patient Care Team: Janora Norlander, DO as PCP - General (Family Medicine) [name], MD as Consulting Physician (Orthopedic Surgery)  CHIEF COMPLAINTS/PURPOSE OF CONSULTATION:   Leukocytosis.  ASSESSMENT & PLAN:  No problem-specific Assessment & Plan notes found for this encounter.  No orders of the defined types were placed in this encounter.  This is a pleasant 60 year old female patient with past medical history significant for type 2 diabetes mellitus, peripheral arterial disease, obesity referred to hematology for evaluation of persistent leukocytosis.  Ms. Bowen denies any new health complaints, she has been feeling much better, losing weight despite eating 3 miles a day and working with her nutritionist to manage her diabetes better. Physical examination today, no palpable lymphadenopathy or hepatosplenomegaly, noted right leg amputation. I reviewed her labs for the past couple years, she has had longstanding leukocytosis which is predominantly lymphocytosis, no neutrophilia.  No anemia or thrombocytopenia.  Given persistent lymphocytosis with occasional absolute lymphocyte count over 5000, I believe it is reasonable to proceed with CBC, flow cytometry, LDH evaluation today.  If she does have any evidence of monoclonal lymphocytosis, then we will continue monitoring her.  If flow is negative, since this is a chronic problem she can return to her primary care physician and follow-up with [place] as needed.  Age-appropriate cancer screening recommended.  She expressed understanding. Thank you for consulting [place] in the care of this patient.  Please not hesitate to contact us with any additional questions or concerns.  HISTORY OF PRESENTING ILLNESS:  Rand Boller Tuggle-Ziglar 60 y.o. female is here because of leukocytosis.  This is a pleasant 60 year old female patient referred to hematology for evaluation of leukocytosis.  Ms. Shenice is here for  an initial visit.  She denies any new health complaints today.  She was following up with nutritionist for her diabetes management and has since managed to lose about 11 pounds.  She denies any fevers, drenching night sweats, loss of appetite loss of weight.  She denies any recent infections or hospitalizations.  She does complain of some pruritus which is longstanding, denies any erythromelalgia or personal history of thromboembolic episodes.  She does not take any steroids. Rest of the pertinent 10 point ROS reviewed and negative.  REVIEW OF SYSTEMS:   Constitutional: Denies fevers, chills or abnormal night sweats Eyes: Denies blurriness of vision, double vision or watery eyes Ears, nose, mouth, throat, and face: Denies mucositis or sore throat Respiratory: Denies cough, dyspnea or wheezes Cardiovascular: Denies palpitation, chest discomfort or lower extremity swelling Gastrointestinal:  Denies nausea, heartburn or change in bowel habits Skin: Denies abnormal skin rashes Lymphatics: Denies new lymphadenopathy or easy bruising Neurological:Denies numbness, tingling or new weaknesses Behavioral/Psych: Mood is stable, no new changes  All other systems were reviewed with the patient and are negative.  MEDICAL HISTORY:  Past Medical History:  Diagnosis Date  . COPD (chronic obstructive pulmonary disease) ([facility])   . Depression   . DM (diabetes mellitus) ([facility])   . DVT (deep venous thrombosis) ([facility])    x5  . Family history of colon cancer   . GERD (gastroesophageal reflux disease)   . Hyperlipemia   . Hypertension   . Hypothyroid   . Obesity   . Osteomyelitis of great toe of right foot ([facility]) 07/04/2018  . PVD (peripheral vascular disease) ([facility])   . Unilateral complete BKA, right, initial encounter ([facility]) 07/11/2018  . Unilateral complete BKA, right, subsequent encounter ([facility])   .  Uterine cancer ([facility])   . UTI (urinary tract infection)     SURGICAL HISTORY: Past Surgical History:   Procedure Laterality Date  . ABDOMINAL HYSTERECTOMY    . AMPUTATION Right 07/08/2018   Procedure: RIGHT BELOW KNEE AMPUTATION;  Surgeon: [name], MD;  Location: [facility];  Service: Orthopedics;  Laterality: Right;  . CESAREAN SECTION    . FEMORAL BYPASS     x 5  . LUMBAR DISC SURGERY     L4-L5  . TOE AMPUTATION     right  . TONSILLECTOMY      SOCIAL HISTORY: Social History   Socioeconomic History  . Marital status: Divorced    Spouse name: Kasandra Knudsen  . Number of children: 1  . Years of education: 38  . Highest education level: Master's degree (e.g., MA, MS, MEng, MEd, MSW, MBA)  Occupational History  . Occupation: Product manager: [employer]  . Occupation: retired  Tobacco Use  . Smoking status: Current Every Day Smoker    Packs/day: 1.00    Years: 38.00    Pack years: 38.00    Types: Cigarettes  . Smokeless tobacco: Never Used  Vaping Use  . Vaping Use: Never used  Substance and Sexual Activity  . Alcohol use: No    Alcohol/week: 0.0 standard drinks  . Drug use: Yes    Frequency: 4.0 times per week    Types: Marijuana  . Sexual activity: Not Currently    Birth control/protection: Surgical  Other Topics Concern  . Not on file  Social History Narrative  . Not on file   Social Determinants of Health   Financial Resource Strain: Low Risk   . Difficulty of Paying Living Expenses: Not hard at all  Food Insecurity: No Food Insecurity  . Worried About Charity fundraiser in the Last Year: Never true  . Ran Out of Food in the Last Year: Never true  Transportation Needs: No Transportation Needs  . Lack of Transportation (Medical): No  . Lack of Transportation (Non-Medical): No  Physical Activity: Inactive  . Days of Exercise per Week: 0 days  . Minutes of Exercise per Session: 0 min  Stress: No Stress Concern Present  . Feeling of Stress : Not at all  Social Connections: Moderately Integrated  . Frequency of Communication with Friends and Family:  More than three times a week  . Frequency of Social Gatherings with Friends and Family: More than three times a week  . Attends Religious Services: More than 4 times per year  . Active Member of Clubs or Organizations: Yes  . Attends Archivist Meetings: More than 4 times per year  . Marital Status: Divorced  Human resources officer Violence: Not At Risk  . Fear of Current or Ex-Partner: No  . Emotionally Abused: No  . Physically Abused: No  . Sexually Abused: No    FAMILY HISTORY: Family History  Problem Relation Age of Onset  . Colon cancer Brother 87  . Hypertension Mother   . Hypothyroidism Mother   . Heart disease Father   . Diabetes Father   . Hyperlipidemia Father   . Clotting disorder Brother   . Diabetes Brother   . Heart disease Brother     ALLERGIES:  is allergic to aleve [naproxen sodium], cortisone, prednisone, statins, and vancomycin.  MEDICATIONS:  Current Outpatient Medications  Medication Sig Dispense Refill  . pravastatin (PRAVACHOL) 20 MG tablet Take 1 tablet by mouth daily.    Marland Kitchen  Accu-Chek Softclix Lancets lancets Use as instructed to test BGs 1-2 times daily E11.52 100 each 12  . ascorbic acid (VITAMIN C) 500 MG tablet Take 500 mg by mouth daily.    Marland Kitchen aspirin EC 81 MG EC tablet Take 1 tablet (81 mg total) by mouth daily.    . Cholecalciferol (VITAMIN D3) 5000 units TABS Take 1 tablet by mouth daily.    . clopidogrel (PLAVIX) 75 MG tablet Take 1 tablet (75 mg total) by mouth daily. 90 tablet 3  . diclofenac Sodium (VOLTAREN) 1 % GEL Apply 2 g topically 4 (four) times daily. Prn pain 300 g 1  . docusate sodium (COLACE) 50 MG capsule Take 50 mg by mouth 2 (two) times daily.    Marland Kitchen escitalopram (LEXAPRO) 10 MG tablet Take 1 tablet (10 mg total) by mouth daily. 90 tablet 3  . ezetimibe (ZETIA) 10 MG tablet Take 1 tablet (10 mg total) by mouth daily. 90 tablet 3  . furosemide (LASIX) 20 MG tablet Take 1 tablet (20 mg total) by mouth daily. 90 tablet 3  .  glimepiride (AMARYL) 4 MG tablet Take 1 tablet (4 mg total) by mouth daily with breakfast. 90 tablet 0  . glucose blood test strip accucheck E11.52; Test BGs 1-2 times daily as directed 100 each 12  . levothyroxine (SYNTHROID) 100 MCG tablet Take 1 tablet (100 mcg total) by mouth daily before breakfast. 90 tablet 3  . losartan (COZAAR) 50 MG tablet Take 1 tablet (50 mg total) by mouth daily. 90 tablet 3  . Omega-3 Fatty Acids (FISH OIL) 1000 MG CAPS Take 1,000 mg by mouth in the morning, at noon, in the evening, and at bedtime.    . pantoprazole (PROTONIX) 40 MG tablet Take 1 tablet (40 mg total) by mouth daily. 90 tablet 3  . Potassium Gluconate 2.5 MEQ TABS Take 1 tablet by mouth daily.    . sitaGLIPtin-metformin (JANUMET) 50-1000 MG tablet Take 1 tablet by mouth 2 (two) times daily with a meal. (REPLACES TRADJENTA and METFORMIN). 60 tablet 2  . tretinoin (RETIN-A) 0.05 % cream Apply topically at bedtime. 45 g 3  . triamcinolone cream (KENALOG) 0.1 % Apply topically 2 (two) times daily for up to 10 days per flare 30 g 0  . warfarin (COUMADIN) 5 MG tablet TAKE AS DIRECTED UP TO 1 & 1/2 TABLETS A DAY 135 tablet 0   No current facility-administered medications for this visit.     PHYSICAL EXAMINATION: ECOG PERFORMANCE STATUS: 0 - Asymptomatic  Vitals:   07/19/20 1234  BP: 131/77  Pulse: 78  Resp: 17  Temp: 98.5 F (36.9 C)  SpO2: 97%   Filed Weights   07/19/20 1234  Weight: 235 lb (106.6 kg)    GENERAL:alert, no distress and comfortable SKIN: skin color, texture, turgor are normal, no rashes or significant lesions EYES: normal, conjunctiva are pink and non-injected, sclera clear OROPHARYNX:no exudate, no erythema and lips, buccal mucosa, and tongue normal  NECK: supple, thyroid normal size, non-tender, without nodularity LYMPH:  no palpable lymphadenopathy in the cervical, axillary or inguinal LUNGS: clear to auscultation and percussion with normal breathing effort HEART:  regular rate & rhythm and no murmurs and no lower extremity edema ABDOMEN:abdomen soft, non-tender and normal bowel sounds Musculoskeletal:no cyanosis of digits and no clubbing, right leg amputated because of diabetic and vascular complications. PSYCH: alert & oriented x 3 with fluent speech NEURO: no focal motor/sensory deficits  LABORATORY DATA:  I have reviewed the data  as listed Lab Results  Component Value Date   WBC 11.5 (H) 06/26/2020   HGB 14.7 06/26/2020   HCT 44.0 06/26/2020   MCV 87 06/26/2020   PLT 273 06/26/2020     Chemistry      Component Value Date/Time   NA 138 06/26/2020 0932   K 4.5 06/26/2020 0932   CL 102 06/26/2020 0932   CO2 22 06/26/2020 0932   BUN 10 06/26/2020 0932   CREATININE 0.75 06/26/2020 0932   CREATININE 0.76 12/21/2012 1707      Component Value Date/Time   CALCIUM 9.3 06/26/2020 0932   ALKPHOS 59 06/26/2020 0932   AST 27 06/26/2020 0932   ALT 35 (H) 06/26/2020 0932   BILITOT 0.3 06/26/2020 0932       RADIOGRAPHIC STUDIES: I have personally reviewed the radiological images as listed and agreed with the findings in the report. No results found.  All questions were answered. The patient knows to call the clinic with any problems, questions or concerns. I spent 30 minutes in the care of this patient including H and P, review of records, counseling and coordination of care.     Benay Pike, MD 07/19/2020 12:52 PM

## 2020-07-22 LAB — SURGICAL PATHOLOGY

## 2020-07-26 ENCOUNTER — Ambulatory Visit (INDEPENDENT_AMBULATORY_CARE_PROVIDER_SITE_OTHER): Payer: Medicare HMO | Admitting: Family Medicine

## 2020-07-26 ENCOUNTER — Other Ambulatory Visit: Payer: Self-pay

## 2020-07-26 ENCOUNTER — Ambulatory Visit (HOSPITAL_COMMUNITY)
Admission: RE | Admit: 2020-07-26 | Discharge: 2020-07-26 | Disposition: A | Discharge status: Home / Self Care | Payer: Medicare HMO | Source: Ambulatory Visit | Attending: Family Medicine | Admitting: Family Medicine

## 2020-07-26 DIAGNOSIS — R1011 Right upper quadrant pain: Secondary | ICD-10-CM | POA: Insufficient documentation

## 2020-07-26 DIAGNOSIS — R110 Nausea: Secondary | ICD-10-CM | POA: Insufficient documentation

## 2020-07-26 DIAGNOSIS — K760 Fatty (change of) liver, not elsewhere classified: Secondary | ICD-10-CM | POA: Diagnosis not present

## 2020-07-26 DIAGNOSIS — R140 Abdominal distension (gaseous): Secondary | ICD-10-CM | POA: Diagnosis not present

## 2020-07-26 DIAGNOSIS — R112 Nausea with vomiting, unspecified: Secondary | ICD-10-CM | POA: Diagnosis not present

## 2020-07-26 NOTE — Progress Notes (Signed)
Telephone visit  Subjective: CC: Abdominal bloating. PCP: Pam Norlander, DO IZT:IWPYKD [name] is a 60 y.o. female calls for telephone consult today. Patient provides verbal consent for consult held via phone.  Due to COVID-19 pandemic this visit was conducted virtually. This visit type was conducted due to national recommendations for restrictions regarding the COVID-19 Pandemic (e.g. social distancing, sheltering in place) in an effort to limit this patient's exposure and mitigate transmission in our community. All issues noted in this document were discussed and addressed.  A physical exam was not performed with this format.   Location of patient: home Location of provider: [facility] Others present for call: none  1. Abdominal pain/ bloating Nausea, vomiting, sour belches, abdominal bloating and pain have been ongoing for quite some time but have been exponentially worse over the last few weeks.  No hematochezia, melena.  She is taking her PPI as directed.  She reports weight loss but not due to the above.  She is been really trying to modify her diet in efforts to bring her sugar down improve her overall health.  Her symptoms do resolve after vomiting but always come back.  She has not identified any specific foods that are contributing to symptoms.  ROS: Per HPI  Allergies  Allergen Reactions  . Aleve [Naproxen Sodium] Hives and Itching  . Cortisone Swelling    Internal organs  . Prednisone Swelling    Makes internal organs swell  . Statins Other (See Comments)    Myalgia even with Pravastatin  . Vancomycin Itching   Past Medical History:  Diagnosis Date  . COPD (chronic obstructive pulmonary disease) ([facility])   . Depression   . DM (diabetes mellitus) ([facility])   . DVT (deep venous thrombosis) ([facility])    x5  . Family history of colon cancer   . GERD (gastroesophageal reflux disease)   . Hyperlipemia   . Hypertension   . Hypothyroid   . Obesity   . Osteomyelitis of great  toe of right foot ([facility]) 07/04/2018  . PVD (peripheral vascular disease) ([facility])   . Unilateral complete BKA, right, initial encounter ([facility]) 07/11/2018  . Unilateral complete BKA, right, subsequent encounter ([facility])   . Uterine cancer ([place] [facility])   . UTI (urinary tract infection)     Current Outpatient Medications:  .  Accu-Chek Softclix Lancets lancets, Use as instructed to test BGs 1-2 times daily E11.52, Disp: 100 each, Rfl: 12 .  ascorbic acid (VITAMIN C) 500 MG tablet, Take 500 mg by mouth daily., Disp: , Rfl:  .  aspirin EC 81 MG EC tablet, Take 1 tablet (81 mg total) by mouth daily., Disp: , Rfl:  .  Cholecalciferol (VITAMIN D3) 5000 units TABS, Take 1 tablet by mouth daily., Disp: , Rfl:  .  clopidogrel (PLAVIX) 75 MG tablet, Take 1 tablet (75 mg total) by mouth daily., Disp: 90 tablet, Rfl: 3 .  diclofenac Sodium (VOLTAREN) 1 % GEL, Apply 2 g topically 4 (four) times daily. Prn pain, Disp: 300 g, Rfl: 1 .  docusate sodium (COLACE) 50 MG capsule, Take 50 mg by mouth 2 (two) times daily., Disp: , Rfl:  .  escitalopram (LEXAPRO) 10 MG tablet, Take 1 tablet (10 mg total) by mouth daily., Disp: 90 tablet, Rfl: 3 .  ezetimibe (ZETIA) 10 MG tablet, Take 1 tablet (10 mg total) by mouth daily., Disp: 90 tablet, Rfl: 3 .  furosemide (LASIX) 20 MG tablet, Take 1 tablet (20 mg total) by mouth daily., Disp: 90  tablet, Rfl: 3 .  glimepiride (AMARYL) 4 MG tablet, Take 1 tablet (4 mg total) by mouth daily with breakfast., Disp: 90 tablet, Rfl: 0 .  glucose blood test strip, accucheck E11.52; Test BGs 1-2 times daily as directed, Disp: 100 each, Rfl: 12 .  levothyroxine (SYNTHROID) 100 MCG tablet, Take 1 tablet (100 mcg total) by mouth daily before breakfast., Disp: 90 tablet, Rfl: 3 .  losartan (COZAAR) 50 MG tablet, Take 1 tablet (50 mg total) by mouth daily., Disp: 90 tablet, Rfl: 3 .  Omega-3 Fatty Acids (FISH OIL) 1000 MG CAPS, Take 1,000 mg by mouth in the morning, at noon, in the evening, and at bedtime.,  Disp: , Rfl:  .  pantoprazole (PROTONIX) 40 MG tablet, Take 1 tablet (40 mg total) by mouth daily., Disp: 90 tablet, Rfl: 3 .  Potassium Gluconate 2.5 MEQ TABS, Take 1 tablet by mouth daily., Disp: , Rfl:  .  pravastatin (PRAVACHOL) 20 MG tablet, Take 1 tablet by mouth daily., Disp: , Rfl:  .  sitaGLIPtin-metformin (JANUMET) 50-1000 MG tablet, Take 1 tablet by mouth 2 (two) times daily with a meal. (REPLACES TRADJENTA and METFORMIN)., Disp: 60 tablet, Rfl: 2 .  tretinoin (RETIN-A) 0.05 % cream, Apply topically at bedtime., Disp: 45 g, Rfl: 3 .  triamcinolone cream (KENALOG) 0.1 %, Apply topically 2 (two) times daily for up to 10 days per flare, Disp: 30 g, Rfl: 0 .  warfarin (COUMADIN) 5 MG tablet, TAKE AS DIRECTED UP TO 1 & 1/2 TABLETS A DAY, Disp: 135 tablet, Rfl: 0  Assessment/ Plan: 60 y.o. female   Chronic nausea - Plan: US Abdomen Limited RUQ (LIVER/GB), Ambulatory referral to Gastroenterology  RUQ pain - Plan: US Abdomen Limited RUQ (LIVER/GB), Ambulatory referral to Gastroenterology  Eval for potential cholecystitis.  Right upper quadrant ultrasound ordered.  Referral back to her gastroenterologist at [facility] also placed.  Offered antiemetics but she declined this.  Continue PPI.   Start time: 9:04am End time: 9:11am  Total time spent on patient care (including telephone call/ virtual visit): 7 minutes  [name], [place] [phone]

## 2020-07-30 ENCOUNTER — Other Ambulatory Visit: Payer: Self-pay | Admitting: Family Medicine

## 2020-07-30 ENCOUNTER — Encounter: Payer: Self-pay | Admitting: Physician Assistant

## 2020-08-02 ENCOUNTER — Other Ambulatory Visit: Payer: Self-pay | Admitting: Family Medicine

## 2020-08-09 ENCOUNTER — Inpatient Hospital Stay (HOSPITAL_COMMUNITY): Payer: Medicare HMO | Attending: Hematology and Oncology | Admitting: Hematology and Oncology

## 2020-08-09 ENCOUNTER — Encounter (HOSPITAL_COMMUNITY): Payer: Self-pay | Admitting: Hematology and Oncology

## 2020-08-09 ENCOUNTER — Other Ambulatory Visit: Payer: Self-pay

## 2020-08-09 VITALS — BP 128/66 | HR 75 | Temp 96.6°F | Resp 16 | Wt 242.6 lb

## 2020-08-09 DIAGNOSIS — E039 Hypothyroidism, unspecified: Secondary | ICD-10-CM | POA: Diagnosis not present

## 2020-08-09 DIAGNOSIS — Z7901 Long term (current) use of anticoagulants: Secondary | ICD-10-CM | POA: Insufficient documentation

## 2020-08-09 DIAGNOSIS — Z800 Family history of malignant neoplasm of digestive organs: Secondary | ICD-10-CM | POA: Diagnosis not present

## 2020-08-09 DIAGNOSIS — Z8542 Personal history of malignant neoplasm of other parts of uterus: Secondary | ICD-10-CM | POA: Diagnosis not present

## 2020-08-09 DIAGNOSIS — Z7984 Long term (current) use of oral hypoglycemic drugs: Secondary | ICD-10-CM | POA: Insufficient documentation

## 2020-08-09 DIAGNOSIS — R197 Diarrhea, unspecified: Secondary | ICD-10-CM | POA: Diagnosis not present

## 2020-08-09 DIAGNOSIS — F17210 Nicotine dependence, cigarettes, uncomplicated: Secondary | ICD-10-CM | POA: Diagnosis not present

## 2020-08-09 DIAGNOSIS — Z7982 Long term (current) use of aspirin: Secondary | ICD-10-CM | POA: Insufficient documentation

## 2020-08-09 DIAGNOSIS — R109 Unspecified abdominal pain: Secondary | ICD-10-CM | POA: Diagnosis not present

## 2020-08-09 DIAGNOSIS — Z8249 Family history of ischemic heart disease and other diseases of the circulatory system: Secondary | ICD-10-CM | POA: Diagnosis not present

## 2020-08-09 DIAGNOSIS — E119 Type 2 diabetes mellitus without complications: Secondary | ICD-10-CM | POA: Insufficient documentation

## 2020-08-09 DIAGNOSIS — Z79899 Other long term (current) drug therapy: Secondary | ICD-10-CM | POA: Diagnosis not present

## 2020-08-09 DIAGNOSIS — Z8349 Family history of other endocrine, nutritional and metabolic diseases: Secondary | ICD-10-CM | POA: Diagnosis not present

## 2020-08-09 DIAGNOSIS — Z89511 Acquired absence of right leg below knee: Secondary | ICD-10-CM | POA: Insufficient documentation

## 2020-08-09 DIAGNOSIS — D72820 Lymphocytosis (symptomatic): Secondary | ICD-10-CM | POA: Diagnosis not present

## 2020-08-09 DIAGNOSIS — Z833 Family history of diabetes mellitus: Secondary | ICD-10-CM | POA: Insufficient documentation

## 2020-08-09 DIAGNOSIS — Z86718 Personal history of other venous thrombosis and embolism: Secondary | ICD-10-CM | POA: Diagnosis not present

## 2020-08-09 DIAGNOSIS — Z90710 Acquired absence of both cervix and uterus: Secondary | ICD-10-CM | POA: Diagnosis not present

## 2020-08-09 NOTE — Progress Notes (Signed)
[facility] NOTE  Patient Care Team: Janora Norlander, DO as PCP - General (Family Medicine) [name], MD as Consulting Physician (Orthopedic Surgery)  CHIEF COMPLAINTS/PURPOSE OF CONSULTATION:   Leukocytosis.  ASSESSMENT & PLAN:  No problem-specific Assessment & Plan notes found for this encounter.  No orders of the defined types were placed in this encounter.  This is a pleasant 60 year old female patient with past medical history significant for type 2 diabetes mellitus, peripheral arterial disease, obesity referred to hematology for evaluation of persistent leukocytosis.   Physical examination today, no palpable lymphadenopathy or hepatosplenomegaly, noted right leg amputation. I reviewed her labs for the past couple years, she has had longstanding leukocytosis which is predominantly lymphocytosis, no neutrophilia.  No anemia or thrombocytopenia.  Given persistent lymphocytosis with occasional absolute lymphocyte count over 5000, we suggested flow. Flow didn't show any clonal population. T cells with an increased CD4:CD8 ratio She was found to have some polycythemia on most recent labs, but otherwise no major concerns from hematology stand point.  Since this is new onset, I have encouraged her to consider testing for sleep apnea,  I will monitor this polcytyhemia, overall presentation less consistent with MPN  2. Abdominal pain,diarrhea, nausea, likely from new anti diabetic medication Encouraged to discuss with her PCP about this.  RTC in 3 months.  Age-appropriate cancer screening recommended.  She expressed understanding. Thank you for consulting [place] in the care of this patient.  Please not hesitate to contact us with any additional questions or concerns.  HISTORY OF PRESENTING ILLNESS:   [name] 60 y.o. female is here because of leukocytosis.  This is a pleasant 60 year old female patient referred to hematology for evaluation of  leukocytosis.  Ms. [name] is here for an initial visit.  She denies any new health complaints today.  She was following up with nutritionist for her diabetes management and has since managed to lose about 11 pounds.  She denies any fevers, drenching night sweats, loss of appetite loss of weight.  She denies any recent infections or hospitalizations.  She does complain of some pruritus which is longstanding, denies any erythromelalgia or personal history of thromboembolic episodes.  She does not take any steroids.  She is here for FU on labs Since last visit, she has this abdominal pain, nausea and diarrhea which have been very bothersome to her. She had US of the abdomen which showed mildly increased heterogenous echo texture, no focal masses. Besides abdominal complaints, she doesn't have any complaints for me. Rest of the pertinent 10 point ROS reviewed and negative.  REVIEW OF SYSTEMS:   Constitutional: Denies fevers, chills or abnormal night sweats Eyes: Denies blurriness of vision, double vision or watery eyes Ears, nose, mouth, throat, and face: Denies mucositis or sore throat Respiratory: Denies cough, dyspnea or wheezes Cardiovascular: Denies palpitation, chest discomfort or lower extremity swelling Gastrointestinal:  As mentioned in HPI Skin: Denies abnormal skin rashes Lymphatics: Denies new lymphadenopathy or easy bruising Neurological:Denies numbness, tingling or new weaknesses Behavioral/Psych: Mood is stable, no new changes  All other systems were reviewed with the patient and are negative.  MEDICAL HISTORY:  Past Medical History:  Diagnosis Date  . COPD (chronic obstructive pulmonary disease) ([place])   . Depression   . DM (diabetes mellitus) ([place])   . DVT (deep venous thrombosis) ([facility])    x5  . Family history of colon cancer   . GERD (gastroesophageal reflux disease)   . Hyperlipemia   .  Hypertension   . Hypothyroid   . Obesity   . Osteomyelitis of great toe of right  foot ([place]) 07/04/2018  . PVD (peripheral vascular disease) ([place])   . Unilateral complete BKA, right, initial encounter ([place]) 07/11/2018  . Unilateral complete BKA, right, subsequent encounter ([place])   . Uterine cancer ([place])   . UTI (urinary tract infection)     SURGICAL HISTORY: Past Surgical History:  Procedure Laterality Date  . ABDOMINAL HYSTERECTOMY    . AMPUTATION Right 07/08/2018   Procedure: RIGHT BELOW KNEE AMPUTATION;  Surgeon: [name], MD;  Location: [facility];  Service: Orthopedics;  Laterality: Right;  . CESAREAN SECTION    . FEMORAL BYPASS     x 5  . LUMBAR DISC SURGERY     L4-L5  . TOE AMPUTATION     right  . TONSILLECTOMY      SOCIAL HISTORY: Social History   Socioeconomic History  . Marital status: Divorced    Spouse name: Kasandra Knudsen  . Number of children: 1  . Years of education: 72  . Highest education level: Master's degree (e.g., MA, MS, MEng, MEd, MSW, MBA)  Occupational History  . Occupation: Product manager: [employer]  . Occupation: retired  Tobacco Use  . Smoking status: Current Every Day Smoker    Packs/day: 1.00    Years: 38.00    Pack years: 38.00    Types: Cigarettes  . Smokeless tobacco: Never Used  Vaping Use  . Vaping Use: Never used  Substance and Sexual Activity  . Alcohol use: No    Alcohol/week: 0.0 standard drinks  . Drug use: Yes    Frequency: 4.0 times per week    Types: Marijuana  . Sexual activity: Not Currently    Birth control/protection: Surgical  Other Topics Concern  . Not on file  Social History Narrative  . Not on file   Social Determinants of Health   Financial Resource Strain: Low Risk   . Difficulty of Paying Living Expenses: Not hard at all  Food Insecurity: No Food Insecurity  . Worried About Charity fundraiser in the Last Year: Never true  . Ran Out of Food in the Last Year: Never true  Transportation Needs: No Transportation Needs  . Lack of Transportation (Medical): No  . Lack of  Transportation (Non-Medical): No  Physical Activity: Inactive  . Days of Exercise per Week: 0 days  . Minutes of Exercise per Session: 0 min  Stress: No Stress Concern Present  . Feeling of Stress : Not at all  Social Connections: Moderately Integrated  . Frequency of Communication with Friends and Family: More than three times a week  . Frequency of Social Gatherings with Friends and Family: More than three times a week  . Attends Religious Services: More than 4 times per year  . Active Member of Clubs or Organizations: Yes  . Attends Archivist Meetings: More than 4 times per year  . Marital Status: Divorced  Human resources officer Violence: Not At Risk  . Fear of Current or Ex-Partner: No  . Emotionally Abused: No  . Physically Abused: No  . Sexually Abused: No    FAMILY HISTORY: Family History  Problem Relation Age of Onset  . Colon cancer Brother 25  . Hypertension Mother   . Hypothyroidism Mother   . Heart disease Father   . Diabetes Father   . Hyperlipidemia Father   . Clotting disorder Brother   . Diabetes Brother   .  Heart disease Brother     ALLERGIES:  is allergic to aleve [naproxen sodium], cortisone, prednisone, statins, and vancomycin.  MEDICATIONS:  Current Outpatient Medications  Medication Sig Dispense Refill  . Accu-Chek Softclix Lancets lancets Use as instructed to test BGs 1-2 times daily E11.52 100 each 12  . ascorbic acid (VITAMIN C) 500 MG tablet Take 500 mg by mouth daily.    Marland Kitchen aspirin EC 81 MG EC tablet Take 1 tablet (81 mg total) by mouth daily.    . Cholecalciferol (VITAMIN D3) 5000 units TABS Take 1 tablet by mouth daily.    . clopidogrel (PLAVIX) 75 MG tablet Take 1 tablet (75 mg total) by mouth daily. 90 tablet 3  . diclofenac Sodium (VOLTAREN) 1 % GEL Apply 2 g topically 4 (four) times daily. Prn pain 300 g 1  . docusate sodium (COLACE) 50 MG capsule Take 50 mg by mouth 2 (two) times daily.    Marland Kitchen escitalopram (LEXAPRO) 10 MG tablet Take  1 tablet (10 mg total) by mouth daily. 90 tablet 3  . ezetimibe (ZETIA) 10 MG tablet Take 1 tablet (10 mg total) by mouth daily. 90 tablet 3  . furosemide (LASIX) 20 MG tablet Take 1 tablet (20 mg total) by mouth daily. 90 tablet 3  . glimepiride (AMARYL) 4 MG tablet TAKE (1) TABLET DAILY WITH BREAKFAST. 30 tablet 0  . glucose blood test strip accucheck E11.52; Test BGs 1-2 times daily as directed 100 each 12  . levothyroxine (SYNTHROID) 100 MCG tablet Take 1 tablet (100 mcg total) by mouth daily before breakfast. 90 tablet 3  . losartan (COZAAR) 50 MG tablet Take 1 tablet (50 mg total) by mouth daily. 90 tablet 3  . Omega-3 Fatty Acids (FISH OIL) 1000 MG CAPS Take 1,000 mg by mouth in the morning, at noon, in the evening, and at bedtime.    . pantoprazole (PROTONIX) 40 MG tablet Take 1 tablet (40 mg total) by mouth daily. 90 tablet 3  . Potassium Gluconate 2.5 MEQ TABS Take 1 tablet by mouth daily.    . pravastatin (PRAVACHOL) 20 MG tablet Take 1 tablet by mouth daily.    . sitaGLIPtin-metformin (JANUMET) 50-1000 MG tablet Take 1 tablet by mouth 2 (two) times daily with a meal. (REPLACES TRADJENTA and METFORMIN). 60 tablet 2  . tretinoin (RETIN-A) 0.05 % cream Apply topically at bedtime. 45 g 3  . triamcinolone cream (KENALOG) 0.1 % Apply topically 2 (two) times daily for up to 10 days per flare 30 g 0  . warfarin (COUMADIN) 5 MG tablet TAKE AS DIRECTED UP TO 1 & 1/2 TABLETS A DAY 135 tablet 0   No current facility-administered medications for this visit.     PHYSICAL EXAMINATION: ECOG PERFORMANCE STATUS: 0 - Asymptomatic  Vitals:   08/09/20 1037  BP: 128/66  Pulse: 75  Resp: 16  Temp: (!) 96.6 F (35.9 C)  SpO2: 97%   Filed Weights   08/09/20 1037  Weight: 242 lb 9.6 oz (110 kg)    GENERAL:alert, no distress and comfortable SKIN: skin color, texture, turgor are normal, no rashes or significant lesions EYES: normal, conjunctiva are pink and non-injected, sclera  clear OROPHARYNX:no exudate, no erythema and lips, buccal mucosa, and tongue normal  NECK: supple, thyroid normal size, non-tender, without nodularity LYMPH:  no palpable lymphadenopathy in the cervical, axillary or inguinal LUNGS: clear to auscultation and percussion with normal breathing effort HEART: regular rate & rhythm and no murmurs and no lower extremity edema  ABDOMEN:abdomen soft, non-tender and normal bowel sounds Musculoskeletal:no cyanosis of digits and no clubbing, right leg amputated because of diabetic and vascular complications. PSYCH: alert & oriented x 3 with fluent speech NEURO: no focal motor/sensory deficits  LABORATORY DATA:  I have reviewed the data as listed Lab Results  Component Value Date   WBC 10.5 07/19/2020   HGB 15.5 (H) 07/19/2020   HCT 47.7 (H) 07/19/2020   MCV 89.2 07/19/2020   PLT 267 07/19/2020     Chemistry      Component Value Date/Time   NA 139 07/19/2020 1324   NA 138 06/26/2020 0932   K 4.1 07/19/2020 1324   CL 105 07/19/2020 1324   CO2 24 07/19/2020 1324   BUN 12 07/19/2020 1324   BUN 10 06/26/2020 0932   CREATININE 0.66 07/19/2020 1324   CREATININE 0.76 12/21/2012 1707      Component Value Date/Time   CALCIUM 9.5 07/19/2020 1324   ALKPHOS 44 07/19/2020 1324   AST 33 07/19/2020 1324   ALT 37 07/19/2020 1324   BILITOT 0.5 07/19/2020 1324   BILITOT 0.3 06/26/2020 0932      RADIOGRAPHIC STUDIES: I have personally reviewed the radiological images as listed and agreed with the findings in the report. US Abdomen Limited RUQ (LIVER/GB)  Result Date: 07/28/2020 CLINICAL DATA:  Chronic nausea and vomiting worsening for the last 2-3 weeks. EXAM: ULTRASOUND ABDOMEN LIMITED RIGHT UPPER QUADRANT COMPARISON:  None. FINDINGS: Gallbladder: No gallstones or wall thickening visualized. No sonographic Murphy sign noted by sonographer. Common bile duct: Diameter: 3 mm Liver: Mildly heterogeneous increased echotexture. No focal masses. Portal vein  is patent on color Doppler imaging with normal direction of blood flow towards the liver. Other: None. IMPRESSION: 1. Mild increased heterogeneous echotexture of the liver is nonspecific but often due to hepatic steatosis. No other abnormalities. Electronically Signed   By: Dorise Bullion III M.D   On: 07/28/2020 11:19    All questions were answered. The patient knows to call the clinic with any problems, questions or concerns. I spent 30 minutes in the care of this patient including H and P, review of records, counseling and coordination of care.     Benay Pike, MD 08/09/2020 11:01 AM

## 2020-08-14 ENCOUNTER — Encounter: Payer: Self-pay | Admitting: Family Medicine

## 2020-08-14 ENCOUNTER — Ambulatory Visit (INDEPENDENT_AMBULATORY_CARE_PROVIDER_SITE_OTHER): Payer: Medicare HMO | Admitting: Family Medicine

## 2020-08-14 ENCOUNTER — Ambulatory Visit: Payer: Medicare HMO | Admitting: Physician Assistant

## 2020-08-14 ENCOUNTER — Other Ambulatory Visit: Payer: Self-pay

## 2020-08-14 VITALS — BP 138/86 | HR 85 | Temp 97.5°F | Ht 70.0 in | Wt 236.0 lb

## 2020-08-14 DIAGNOSIS — E039 Hypothyroidism, unspecified: Secondary | ICD-10-CM | POA: Diagnosis not present

## 2020-08-14 DIAGNOSIS — E1165 Type 2 diabetes mellitus with hyperglycemia: Secondary | ICD-10-CM | POA: Diagnosis not present

## 2020-08-14 DIAGNOSIS — Z86718 Personal history of other venous thrombosis and embolism: Secondary | ICD-10-CM | POA: Diagnosis not present

## 2020-08-14 DIAGNOSIS — Z7901 Long term (current) use of anticoagulants: Secondary | ICD-10-CM | POA: Diagnosis not present

## 2020-08-14 LAB — COAGUCHEK XS/INR WAIVED
INR: 1.3 — ABNORMAL HIGH (ref 0.9–1.1)
Prothrombin Time: 15.8 s

## 2020-08-14 MED ORDER — RIVAROXABAN 20 MG PO TABS: 20.0000 mg | ORAL_TABLET | Freq: Every day | ORAL | 0 refills | Status: DC

## 2020-08-14 MED ORDER — RIVAROXABAN 20 MG PO TABS: 20.0000 mg | ORAL_TABLET | Freq: Every day | ORAL | 3 refills | Status: DC

## 2020-08-14 MED ORDER — METFORMIN HCL ER 500 MG PO TB24: 1000.0000 mg | ORAL_TABLET | Freq: Two times a day (BID) | ORAL | 2 refills | Status: DC

## 2020-08-14 NOTE — Patient Instructions (Signed)
Plan for FASTING cholesterol panel with your A1c and INR next visit.

## 2020-08-14 NOTE — Progress Notes (Signed)
Subjective: CC: h/o DVT PCP: Pam Norlander, DO [name] is a 60 y.o. female presenting to clinic today for:  1. H/o DVT Patient was slightly subtherapeutic with last INR of 1.8.  She reports her current regimen is 1.5 tablets on Mondays, Wednesdays and Fridays with 1 tablet daily all other days.  She did miss a Tuesday dose last week.  No reports of chest pain, shortness of breath, leg swelling.  She has history of BKA on the right due to DVT.  She is followed by Dr. Donnetta Hutching and he is advised her continuing long-term anticoagulation given history of her DVT which compromised lower extremity.  She has considered Xarelto or Eliquis in the past but notes that she could not afford it.  However, if she could afford it to be very willing to switch  2.  Hypothyroidism Patient's TSH was noted to be slightly elevated last visit.  No medication adjustments were made.  She is here for recheck.  No reports of tremor, heart palpitation.  She has been purposefully losing weight with a 15 pound weight loss.  3.  Elevated white blood cell count Of unknown etiology.  She is here for recheck but declines to get this done because we do not have butterfly needles in office.  She notes that this is the best that she has felt in a long time   ROS: Per HPI  Allergies  Allergen Reactions  . Aleve [Naproxen Sodium] Hives and Itching  . Cortisone Swelling    Internal organs  . Prednisone Swelling    Makes internal organs swell  . Statins Other (See Comments)    Myalgia even with Pravastatin  . Vancomycin Itching   Past Medical History:  Diagnosis Date  . COPD (chronic obstructive pulmonary disease) ([place])   . Depression   . DM (diabetes mellitus) ([place])   . DVT (deep venous thrombosis) ([facility])    x5  . Family history of colon cancer   . GERD (gastroesophageal reflux disease)   . Hyperlipemia   . Hypertension   . Hypothyroid   . Obesity   . Osteomyelitis of great toe of right foot  ([place]) 07/04/2018  . PVD (peripheral vascular disease) ([place])   . Unilateral complete BKA, right, initial encounter ([place]) 07/11/2018  . Unilateral complete BKA, right, subsequent encounter ([place])   . Uterine cancer ([place])   . UTI (urinary tract infection)     Current Outpatient Medications:  .  Accu-Chek Softclix Lancets lancets, Use as instructed to test BGs 1-2 times daily E11.52, Disp: 100 each, Rfl: 12 .  ascorbic acid (VITAMIN C) 500 MG tablet, Take 500 mg by mouth daily., Disp: , Rfl:  .  aspirin EC 81 MG EC tablet, Take 1 tablet (81 mg total) by mouth daily., Disp: , Rfl:  .  Cholecalciferol (VITAMIN D3) 5000 units TABS, Take 1 tablet by mouth daily., Disp: , Rfl:  .  clopidogrel (PLAVIX) 75 MG tablet, Take 1 tablet (75 mg total) by mouth daily., Disp: 90 tablet, Rfl: 3 .  diclofenac Sodium (VOLTAREN) 1 % GEL, Apply 2 g topically 4 (four) times daily. Prn pain, Disp: 300 g, Rfl: 1 .  docusate sodium (COLACE) 50 MG capsule, Take 50 mg by mouth 2 (two) times daily., Disp: , Rfl:  .  escitalopram (LEXAPRO) 10 MG tablet, Take 1 tablet (10 mg total) by mouth daily., Disp: 90 tablet, Rfl: 3 .  ezetimibe (ZETIA) 10 MG tablet, Take 1 tablet (10 mg  total) by mouth daily., Disp: 90 tablet, Rfl: 3 .  furosemide (LASIX) 20 MG tablet, Take 1 tablet (20 mg total) by mouth daily., Disp: 90 tablet, Rfl: 3 .  glimepiride (AMARYL) 4 MG tablet, TAKE (1) TABLET DAILY WITH BREAKFAST., Disp: 30 tablet, Rfl: 0 .  glucose blood test strip, accucheck E11.52; Test BGs 1-2 times daily as directed, Disp: 100 each, Rfl: 12 .  levothyroxine (SYNTHROID) 100 MCG tablet, Take 1 tablet (100 mcg total) by mouth daily before breakfast., Disp: 90 tablet, Rfl: 3 .  losartan (COZAAR) 50 MG tablet, Take 1 tablet (50 mg total) by mouth daily., Disp: 90 tablet, Rfl: 3 .  Omega-3 Fatty Acids (FISH OIL) 1000 MG CAPS, Take 1,000 mg by mouth in the morning, at noon, in the evening, and at bedtime., Disp: , Rfl:  .  pantoprazole  (PROTONIX) 40 MG tablet, Take 1 tablet (40 mg total) by mouth daily., Disp: 90 tablet, Rfl: 3 .  Potassium Gluconate 2.5 MEQ TABS, Take 1 tablet by mouth daily., Disp: , Rfl:  .  pravastatin (PRAVACHOL) 20 MG tablet, Take 1 tablet by mouth daily., Disp: , Rfl:  .  sitaGLIPtin-metformin (JANUMET) 50-1000 MG tablet, Take 1 tablet by mouth 2 (two) times daily with a meal. (REPLACES TRADJENTA and METFORMIN)., Disp: 60 tablet, Rfl: 2 .  tretinoin (RETIN-A) 0.05 % cream, Apply topically at bedtime., Disp: 45 g, Rfl: 3 .  triamcinolone cream (KENALOG) 0.1 %, Apply topically 2 (two) times daily for up to 10 days per flare, Disp: 30 g, Rfl: 0 .  warfarin (COUMADIN) 5 MG tablet, TAKE AS DIRECTED UP TO 1 & 1/2 TABLETS A DAY, Disp: 135 tablet, Rfl: 0 Social History   Socioeconomic History  . Marital status: Divorced    Spouse name: Kasandra Knudsen  . Number of children: 1  . Years of education: 24  . Highest education level: Master's degree (e.g., MA, MS, MEng, MEd, MSW, MBA)  Occupational History  . Occupation: Product manager: [employer]  . Occupation: retired  Tobacco Use  . Smoking status: Current Every Day Smoker    Packs/day: 1.00    Years: 38.00    Pack years: 38.00    Types: Cigarettes  . Smokeless tobacco: Never Used  Vaping Use  . Vaping Use: Never used  Substance and Sexual Activity  . Alcohol use: No    Alcohol/week: 0.0 standard drinks  . Drug use: Yes    Frequency: 4.0 times per week    Types: Marijuana  . Sexual activity: Not Currently    Birth control/protection: Surgical  Other Topics Concern  . Not on file  Social History Narrative  . Not on file   Social Determinants of Health   Financial Resource Strain: Low Risk   . Difficulty of Paying Living Expenses: Not hard at all  Food Insecurity: No Food Insecurity  . Worried About Charity fundraiser in the Last Year: Never true  . Ran Out of Food in the Last Year: Never true  Transportation Needs: No Transportation  Needs  . Lack of Transportation (Medical): No  . Lack of Transportation (Non-Medical): No  Physical Activity: Inactive  . Days of Exercise per Week: 0 days  . Minutes of Exercise per Session: 0 min  Stress: No Stress Concern Present  . Feeling of Stress : Not at all  Social Connections: Moderately Integrated  . Frequency of Communication with Friends and Family: More than three times a week  . Frequency  of Social Gatherings with Friends and Family: More than three times a week  . Attends Religious Services: More than 4 times per year  . Active Member of Clubs or Organizations: Yes  . Attends Archivist Meetings: More than 4 times per year  . Marital Status: Divorced  Human resources officer Violence: Not At Risk  . Fear of Current or Ex-Partner: No  . Emotionally Abused: No  . Physically Abused: No  . Sexually Abused: No   Family History  Problem Relation Age of Onset  . Colon cancer Brother 29  . Hypertension Mother   . Hypothyroidism Mother   . Heart disease Father   . Diabetes Father   . Hyperlipidemia Father   . Clotting disorder Brother   . Diabetes Brother   . Heart disease Brother     Objective: Office vital signs reviewed. BP 138/86   Pulse 85   Temp (!) 97.5 F (36.4 C) (Temporal)   Ht 5\' 10"  (1.778 m)   Wt 236 lb (107 kg)   SpO2 96%   BMI 33.86 kg/m   Physical Examination:  General: Awake, alert, Chronically ill-appearing, No acute distress HEENT: Normal; no exophthalmos or goiter  Cardio: regular rate and rhythm, S1S2 heard, no murmurs appreciated Pulm: clear to auscultation bilaterally, no wheezes, rhonchi or rales; normal work of breathing on room air Extremities: Right stump with ongoing shallow ulcer noted medially and anterior laterally.  Assessment/ Plan: 60 y.o. female   On anticoagulant therapy - Plan: CoaguChek XS/INR Waived, CBC with Differential/Platelet, rivaroxaban (XARELTO) 20 MG TABS tablet, DISCONTINUED: rivaroxaban (XARELTO) 20 MG  TABS tablet  History of DVT (deep vein thrombosis) - Plan: CoaguChek XS/INR Waived, rivaroxaban (XARELTO) 20 MG TABS tablet, DISCONTINUED: rivaroxaban (XARELTO) 20 MG TABS tablet, CANCELED: CBC with Differential  Acquired hypothyroidism - Plan: Thyroid Panel With TSH, CANCELED: Thyroid Panel With TSH  Uncontrolled type 2 diabetes mellitus with hyperglycemia ([place]) - Plan: metFORMIN (GLUCOPHAGE XR) 500 MG 24 hr tablet  INR subtherapeutic but she would like to try transitioning over to Xarelto.  Have given her two weeks worth of samples and a voucher for a full month worth of free medication.  I have also sent a prescription over to [facility] efforts to get this medication covered for this patient.  We will plan for CBC and thyroid panel at next visit as we do not have the needle of choice for the patient  I have sent in Metformin XR 1000 mg twice daily for her uncontrolled diabetes. Rehceck A1c in April  No orders of the defined types were placed in this encounter.  No orders of the defined types were placed in this encounter.    Pam Norlander, DO [facility] [phone]

## 2020-08-19 ENCOUNTER — Ambulatory Visit: Payer: Medicare HMO | Admitting: Nutrition

## 2020-08-28 ENCOUNTER — Telehealth: Payer: Self-pay | Admitting: *Deleted

## 2020-08-28 NOTE — Telephone Encounter (Signed)
PA in process Tretinoin 0.05% cream   Key: [id number] - PA Case ID: [id number] - Rx #: [id number]

## 2020-08-30 NOTE — Telephone Encounter (Signed)
PA approved through 06/07/21. Pharmacy aware

## 2020-09-02 ENCOUNTER — Ambulatory Visit: Payer: Medicare HMO | Admitting: Family Medicine

## 2020-09-03 ENCOUNTER — Other Ambulatory Visit: Payer: Self-pay | Admitting: Family Medicine

## 2020-09-03 DIAGNOSIS — Z86718 Personal history of other venous thrombosis and embolism: Secondary | ICD-10-CM

## 2020-09-03 DIAGNOSIS — Z7901 Long term (current) use of anticoagulants: Secondary | ICD-10-CM

## 2020-09-10 ENCOUNTER — Other Ambulatory Visit: Payer: Self-pay | Admitting: Family Medicine

## 2020-09-10 DIAGNOSIS — Z86718 Personal history of other venous thrombosis and embolism: Secondary | ICD-10-CM

## 2020-09-10 DIAGNOSIS — Z7901 Long term (current) use of anticoagulants: Secondary | ICD-10-CM

## 2020-09-18 ENCOUNTER — Ambulatory Visit (INDEPENDENT_AMBULATORY_CARE_PROVIDER_SITE_OTHER): Payer: Medicare HMO | Admitting: Family Medicine

## 2020-09-18 ENCOUNTER — Encounter: Payer: Self-pay | Admitting: Family Medicine

## 2020-09-18 DIAGNOSIS — T8740 Infection of amputation stump, unspecified extremity: Secondary | ICD-10-CM

## 2020-09-18 DIAGNOSIS — T8743 Infection of amputation stump, right lower extremity: Secondary | ICD-10-CM

## 2020-09-18 MED ORDER — FLUCONAZOLE 150 MG PO TABS: 150.0000 mg | ORAL_TABLET | Freq: Once | ORAL | 0 refills | Status: AC

## 2020-09-18 MED ORDER — DOXYCYCLINE HYCLATE 100 MG PO TABS: 100.0000 mg | ORAL_TABLET | Freq: Two times a day (BID) | ORAL | 0 refills | Status: DC

## 2020-09-18 NOTE — Progress Notes (Signed)
Telephone visit  Subjective: CC: infected leg PCP: Janora Norlander, DO GEX:BMWUXL [name] is a 60 y.o. female calls for telephone consult today. Patient provides verbal consent for consult held via phone.  Due to COVID-19 pandemic this visit was conducted virtually. This visit type was conducted due to national recommendations for restrictions regarding the COVID-19 Pandemic (e.g. social distancing, sheltering in place) in an effort to limit this patient's exposure and mitigate transmission in our community. All issues noted in this document were discussed and addressed.  A physical exam was not performed with this format.   Location of patient: home Location of provider: [facility] Others present for call: none  1. Infected leg Patient reports that she has been going back and forth to the leg doctor for adjustment.  Last evening it was aching all night.  She had a piece of hard skin off and then a bunch of puss came out of it.  No fevers, chills, nausea or vomiting.  Pain actually feels better now that the pus has drained.   ROS: Per HPI  Allergies  Allergen Reactions  . Aleve [Naproxen Sodium] Hives and Itching  . Cortisone Swelling    Internal organs  . Prednisone Swelling    Makes internal organs swell  . Statins Other (See Comments)    Myalgia even with Pravastatin  . Vancomycin Itching   Past Medical History:  Diagnosis Date  . COPD (chronic obstructive pulmonary disease) ([place])   . Depression   . DM (diabetes mellitus) ([place])   . DVT (deep venous thrombosis) (HCC)    x5  . Family history of colon cancer   . GERD (gastroesophageal reflux disease)   . Hyperlipemia   . Hypertension   . Hypothyroid   . Obesity   . Osteomyelitis of great toe of right foot ([place]) 07/04/2018  . PVD (peripheral vascular disease) ([place])   . Unilateral complete BKA, right, initial encounter ([place]) 07/11/2018  . Unilateral complete BKA, right, subsequent encounter ([place])   . Uterine cancer  ([place])   . UTI (urinary tract infection)     Current Outpatient Medications:  .  Accu-Chek Softclix Lancets lancets, Use as instructed to test BGs 1-2 times daily E11.52, Disp: 100 each, Rfl: 12 .  ascorbic acid (VITAMIN C) 500 MG tablet, Take 500 mg by mouth daily., Disp: , Rfl:  .  aspirin EC 81 MG EC tablet, Take 1 tablet (81 mg total) by mouth daily., Disp: , Rfl:  .  Cholecalciferol (VITAMIN D3) 5000 units TABS, Take 1 tablet by mouth daily., Disp: , Rfl:  .  clopidogrel (PLAVIX) 75 MG tablet, Take 1 tablet (75 mg total) by mouth daily., Disp: 90 tablet, Rfl: 3 .  diclofenac Sodium (VOLTAREN) 1 % GEL, Apply 2 g topically 4 (four) times daily. Prn pain, Disp: 300 g, Rfl: 1 .  docusate sodium (COLACE) 50 MG capsule, Take 50 mg by mouth 2 (two) times daily., Disp: , Rfl:  .  escitalopram (LEXAPRO) 10 MG tablet, Take 1 tablet (10 mg total) by mouth daily., Disp: 90 tablet, Rfl: 3 .  ezetimibe (ZETIA) 10 MG tablet, Take 1 tablet (10 mg total) by mouth daily., Disp: 90 tablet, Rfl: 3 .  furosemide (LASIX) 20 MG tablet, Take 1 tablet (20 mg total) by mouth daily., Disp: 90 tablet, Rfl: 3 .  glimepiride (AMARYL) 4 MG tablet, TAKE (1) TABLET DAILY WITH BREAKFAST., Disp: 30 tablet, Rfl: 1 .  glucose blood test strip, accucheck E11.52; Test BGs 1-2  times daily as directed, Disp: 100 each, Rfl: 12 .  levothyroxine (SYNTHROID) 100 MCG tablet, Take 1 tablet (100 mcg total) by mouth daily before breakfast., Disp: 90 tablet, Rfl: 3 .  losartan (COZAAR) 50 MG tablet, Take 1 tablet (50 mg total) by mouth daily., Disp: 90 tablet, Rfl: 3 .  metFORMIN (GLUCOPHAGE XR) 500 MG 24 hr tablet, Take 2 tablets (1,000 mg total) by mouth in the morning and at bedtime., Disp: 360 tablet, Rfl: 2 .  Omega-3 Fatty Acids (FISH OIL) 1000 MG CAPS, Take 1,000 mg by mouth in the morning, at noon, in the evening, and at bedtime., Disp: , Rfl:  .  pantoprazole (PROTONIX) 40 MG tablet, Take 1 tablet (40 mg total) by mouth daily.,  Disp: 90 tablet, Rfl: 3 .  Potassium Gluconate 2.5 MEQ TABS, Take 1 tablet by mouth daily., Disp: , Rfl:  .  pravastatin (PRAVACHOL) 20 MG tablet, Take 1 tablet by mouth daily., Disp: , Rfl:  .  rivaroxaban (XARELTO) 20 MG TABS tablet, Take 1 tablet (20 mg total) by mouth daily with supper., Disp: 90 tablet, Rfl: 3 .  tretinoin (RETIN-A) 0.05 % cream, Apply topically at bedtime., Disp: 45 g, Rfl: 3 .  triamcinolone cream (KENALOG) 0.1 %, Apply topically 2 (two) times daily for up to 10 days per flare, Disp: 30 g, Rfl: 0  Assessment/ Plan: 60 y.o. female   Infection of amputation stump ([place]) - Plan: DG Tibia/Fibula Right, doxycycline (VIBRA-TABS) 100 MG tablet, fluconazole (DIFLUCAN) 150 MG tablet  I am placing her on doxycycline.  I would like to get plain films of her stop to make sure that there is no obvious evidence of osteomyelitis.  We discussed the reasons for emergent evaluation emergency department.  Would like her to be in office and reevaluated on Monday.  Will arrange this for patient  Start time: 9:09am End time: 9:15am  Total time spent on patient care (including telephone call/ virtual visit): 6 minutes  [place], [place] [phone]

## 2020-09-19 ENCOUNTER — Other Ambulatory Visit: Payer: Medicare HMO

## 2020-09-19 ENCOUNTER — Ambulatory Visit (INDEPENDENT_AMBULATORY_CARE_PROVIDER_SITE_OTHER): Payer: Medicare HMO

## 2020-09-19 ENCOUNTER — Other Ambulatory Visit: Payer: Self-pay

## 2020-09-19 DIAGNOSIS — T8740 Infection of amputation stump, unspecified extremity: Secondary | ICD-10-CM

## 2020-09-19 DIAGNOSIS — Z89612 Acquired absence of left leg above knee: Secondary | ICD-10-CM | POA: Diagnosis not present

## 2020-09-19 DIAGNOSIS — T8743 Infection of amputation stump, right lower extremity: Secondary | ICD-10-CM | POA: Diagnosis not present

## 2020-09-23 ENCOUNTER — Ambulatory Visit: Payer: Medicare HMO | Admitting: Family Medicine

## 2020-09-23 ENCOUNTER — Telehealth: Payer: Self-pay

## 2020-09-23 NOTE — Telephone Encounter (Signed)
Patient called and was notified of x-ray results in chart by the front staff

## 2020-09-26 ENCOUNTER — Other Ambulatory Visit: Payer: Self-pay

## 2020-09-26 DIAGNOSIS — Z1231 Encounter for screening mammogram for malignant neoplasm of breast: Secondary | ICD-10-CM

## 2020-10-01 ENCOUNTER — Ambulatory Visit (INDEPENDENT_AMBULATORY_CARE_PROVIDER_SITE_OTHER): Payer: Medicare HMO | Admitting: Family Medicine

## 2020-10-01 ENCOUNTER — Encounter: Payer: Self-pay | Admitting: Family Medicine

## 2020-10-01 ENCOUNTER — Other Ambulatory Visit: Payer: Self-pay

## 2020-10-01 VITALS — BP 132/87 | HR 81 | Temp 97.5°F | Ht 70.0 in | Wt 230.4 lb

## 2020-10-01 DIAGNOSIS — E1152 Type 2 diabetes mellitus with diabetic peripheral angiopathy with gangrene: Secondary | ICD-10-CM

## 2020-10-01 DIAGNOSIS — E039 Hypothyroidism, unspecified: Secondary | ICD-10-CM

## 2020-10-01 DIAGNOSIS — Z9889 Other specified postprocedural states: Secondary | ICD-10-CM

## 2020-10-01 DIAGNOSIS — M5441 Lumbago with sciatica, right side: Secondary | ICD-10-CM | POA: Diagnosis not present

## 2020-10-01 LAB — BAYER DCA HB A1C WAIVED: HB A1C (BAYER DCA - WAIVED): 6.7 % (ref <7.0)

## 2020-10-01 MED ORDER — GABAPENTIN 300 MG PO CAPS: 300.0000 mg | ORAL_CAPSULE | Freq: Every day | ORAL | 2 refills | Status: DC

## 2020-10-01 NOTE — Patient Instructions (Signed)

## 2020-10-01 NOTE — Progress Notes (Signed)
Subjective: CC: Acute back pain PCP: Janora Norlander, DO KGU:RKYHCW [name] is a 60 y.o. female presenting to clinic today for:  1.  Acute on chronic back pain Patient with known degenerative changes in her lumbar spine.  At that she is had history of back surgery in the past.  She notes that over the last couple of weeks she has started having exacerbation in her sciatica but on the right side this time.  She describes a pain that starts in the lower back and radiates into the right buttock and thigh.  She has history of BKA on this side.  The lesion that she had on the stump has since healed and is doing well.  She took some gabapentin from her friend but she is not sure the dose and did not find this to be helpful.  In fact the only thing that is helped was use of some Motrin.  She knows she is not supposed to take any Motrin because she is chronically anticoagulated with Xarelto and Plavix.  She does report easy bleeding.  Pain seems to be exacerbated by sitting and somewhat relieved by standing.  She denies any preceding injury.  Physical therapy has helped in the past and she would like to be referred to them again  2.  Diabetes Patient reports compliance with all her medications.  She has been trying to watch what she eats.  She is very optimistic about today's blood sugar check.  She does not report any hypoglycemic episodes  3.  Hypothyroidism Compliant with Synthroid.  No reports of tremor heart palpitation   ROS: Per HPI  Allergies  Allergen Reactions  . Aleve [Naproxen Sodium] Hives and Itching  . Cortisone Swelling    Internal organs  . Prednisone Swelling    Makes internal organs swell  . Statins Other (See Comments)    Myalgia even with Pravastatin  . Vancomycin Itching   Past Medical History:  Diagnosis Date  . COPD (chronic obstructive pulmonary disease) ([place])   . Depression   . DM (diabetes mellitus) ([place])   . DVT (deep venous thrombosis) (HCC)     x5  . Family history of colon cancer   . GERD (gastroesophageal reflux disease)   . Hyperlipemia   . Hypertension   . Hypothyroid   . Obesity   . Osteomyelitis of great toe of right foot ([place]) 07/04/2018  . PVD (peripheral vascular disease) ([place])   . Unilateral complete BKA, right, initial encounter ([place]) 07/11/2018  . Unilateral complete BKA, right, subsequent encounter ([place])   . Uterine cancer ([place])   . UTI (urinary tract infection)     Current Outpatient Medications:  .  Accu-Chek Softclix Lancets lancets, Use as instructed to test BGs 1-2 times daily E11.52, Disp: 100 each, Rfl: 12 .  ascorbic acid (VITAMIN C) 500 MG tablet, Take 500 mg by mouth daily., Disp: , Rfl:  .  aspirin EC 81 MG EC tablet, Take 1 tablet (81 mg total) by mouth daily., Disp: , Rfl:  .  Cholecalciferol (VITAMIN D3) 5000 units TABS, Take 1 tablet by mouth daily., Disp: , Rfl:  .  clopidogrel (PLAVIX) 75 MG tablet, Take 1 tablet (75 mg total) by mouth daily., Disp: 90 tablet, Rfl: 3 .  diclofenac Sodium (VOLTAREN) 1 % GEL, Apply 2 g topically 4 (four) times daily. Prn pain, Disp: 300 g, Rfl: 1 .  docusate sodium (COLACE) 50 MG capsule, Take 50 mg by mouth 2 (two) times  daily., Disp: , Rfl:  .  doxycycline (VIBRA-TABS) 100 MG tablet, Take 1 tablet (100 mg total) by mouth 2 (two) times daily., Disp: 20 tablet, Rfl: 0 .  escitalopram (LEXAPRO) 10 MG tablet, Take 1 tablet (10 mg total) by mouth daily., Disp: 90 tablet, Rfl: 3 .  ezetimibe (ZETIA) 10 MG tablet, Take 1 tablet (10 mg total) by mouth daily., Disp: 90 tablet, Rfl: 3 .  furosemide (LASIX) 20 MG tablet, Take 1 tablet (20 mg total) by mouth daily., Disp: 90 tablet, Rfl: 3 .  glimepiride (AMARYL) 4 MG tablet, TAKE (1) TABLET DAILY WITH BREAKFAST., Disp: 30 tablet, Rfl: 1 .  glucose blood test strip, accucheck E11.52; Test BGs 1-2 times daily as directed, Disp: 100 each, Rfl: 12 .  levothyroxine (SYNTHROID) 100 MCG tablet, Take 1 tablet (100 mcg total) by mouth  daily before breakfast., Disp: 90 tablet, Rfl: 3 .  losartan (COZAAR) 50 MG tablet, Take 1 tablet (50 mg total) by mouth daily., Disp: 90 tablet, Rfl: 3 .  metFORMIN (GLUCOPHAGE XR) 500 MG 24 hr tablet, Take 2 tablets (1,000 mg total) by mouth in the morning and at bedtime., Disp: 360 tablet, Rfl: 2 .  Omega-3 Fatty Acids (FISH OIL) 1000 MG CAPS, Take 1,000 mg by mouth in the morning, at noon, in the evening, and at bedtime., Disp: , Rfl:  .  pantoprazole (PROTONIX) 40 MG tablet, Take 1 tablet (40 mg total) by mouth daily., Disp: 90 tablet, Rfl: 3 .  Potassium Gluconate 2.5 MEQ TABS, Take 1 tablet by mouth daily., Disp: , Rfl:  .  pravastatin (PRAVACHOL) 20 MG tablet, Take 1 tablet by mouth daily., Disp: , Rfl:  .  rivaroxaban (XARELTO) 20 MG TABS tablet, Take 1 tablet (20 mg total) by mouth daily with supper., Disp: 90 tablet, Rfl: 3 .  tretinoin (RETIN-A) 0.05 % cream, Apply topically at bedtime., Disp: 45 g, Rfl: 3 .  triamcinolone cream (KENALOG) 0.1 %, Apply topically 2 (two) times daily for up to 10 days per flare, Disp: 30 g, Rfl: 0 Social History   Socioeconomic History  . Marital status: Divorced    Spouse name: Kasandra Knudsen  . Number of children: 1  . Years of education: 64  . Highest education level: Master's degree (e.g., MA, MS, MEng, MEd, MSW, MBA)  Occupational History  . Occupation: Product manager: [employer]  . Occupation: retired  Tobacco Use  . Smoking status: Current Every Day Smoker    Packs/day: 1.00    Years: 38.00    Pack years: 38.00    Types: Cigarettes  . Smokeless tobacco: Never Used  Vaping Use  . Vaping Use: Never used  Substance and Sexual Activity  . Alcohol use: No    Alcohol/week: 0.0 standard drinks  . Drug use: Yes    Frequency: 4.0 times per week    Types: Marijuana  . Sexual activity: Not Currently    Birth control/protection: Surgical  Other Topics Concern  . Not on file  Social History Narrative  . Not on file   Social  Determinants of Health   Financial Resource Strain: Low Risk   . Difficulty of Paying Living Expenses: Not hard at all  Food Insecurity: No Food Insecurity  . Worried About Charity fundraiser in the Last Year: Never true  . Ran Out of Food in the Last Year: Never true  Transportation Needs: No Transportation Needs  . Lack of Transportation (Medical): No  . Lack  of Transportation (Non-Medical): No  Physical Activity: Inactive  . Days of Exercise per Week: 0 days  . Minutes of Exercise per Session: 0 min  Stress: No Stress Concern Present  . Feeling of Stress : Not at all  Social Connections: Moderately Integrated  . Frequency of Communication with Friends and Family: More than three times a week  . Frequency of Social Gatherings with Friends and Family: More than three times a week  . Attends Religious Services: More than 4 times per year  . Active Member of Clubs or Organizations: Yes  . Attends Archivist Meetings: More than 4 times per year  . Marital Status: Divorced  Human resources officer Violence: Not At Risk  . Fear of Current or Ex-Partner: No  . Emotionally Abused: No  . Physically Abused: No  . Sexually Abused: No   Family History  Problem Relation Age of Onset  . Colon cancer Brother 29  . Hypertension Mother   . Hypothyroidism Mother   . Heart disease Father   . Diabetes Father   . Hyperlipidemia Father   . Clotting disorder Brother   . Diabetes Brother   . Heart disease Brother     Objective: Office vital signs reviewed. BP 132/87   Pulse 81   Temp (!) 97.5 F (36.4 C)   Ht 5\' 10"  (1.778 m)   Wt 230 lb 6.4 oz (104.5 kg)   SpO2 98%   BMI 33.06 kg/m   Physical Examination:  General: Awake, alert, No acute distress HEENT: Normal; sclera white.  No exophthalmos.  No goiter Cardio: regular rate and rhythm, S1S2 heard, no murmurs appreciated Pulm: clear to auscultation bilaterally, no wheezes, rhonchi or rales; normal work of breathing on room  air MSK: Ambulating independently.  No midline tenderness to palpation.  Surgically absent right lower leg Skin: Healing lesion noted at the distal end of her stump on the right.  No evidence of ongoing infection.  No palpable fluctuance, induration, heat.  No appreciable erythema.  She has postinflammatory hyperpigmentation noted however  Assessment/ Plan: 60 y.o. female   Acquired hypothyroidism - Plan: TSH, T4, Free  Type 2 diabetes mellitus with diabetic peripheral angiopathy and gangrene, without long-term current use of insulin (HCC) - Plan: Bayer DCA Hb A1c Waived  Acute right-sided low back pain with right-sided sciatica - Plan: Ambulatory referral to Physical Therapy, Ambulatory referral to Orthopedic Surgery, gabapentin (NEURONTIN) 300 MG capsule  History of back surgery - Plan: Ambulatory referral to Physical Therapy, Ambulatory referral to Orthopedic Surgery, gabapentin (NEURONTIN) 300 MG capsule  Check TSH, free T4  Her A1c showed excellent control of her sugar now.  No changes to medication regimen needed.  Very pleased with her results.  Continue current lifestyle modification and medications  She has acute on chronic back pain.  I put her back on Neurontin 300 mg nightly.  Referral to physical therapy and orthopedics placed.  Advised against use of NSAIDs given chronic anticoagulation.  Okay to use Tylenol, heat.  No orders of the defined types were placed in this encounter.  No orders of the defined types were placed in this encounter.    Janora Norlander, DO [place] [phone]

## 2020-10-02 ENCOUNTER — Other Ambulatory Visit: Payer: Self-pay | Admitting: Family Medicine

## 2020-10-02 LAB — T4, FREE: Free T4: 1.27 ng/dL (ref 0.82–1.77)

## 2020-10-02 LAB — TSH: TSH: 1.11 u[IU]/mL (ref 0.450–4.500)

## 2020-10-02 MED ORDER — LEVOTHYROXINE SODIUM 100 MCG PO TABS: 100.0000 ug | ORAL_TABLET | Freq: Every day | ORAL | 3 refills | Status: DC

## 2020-10-09 ENCOUNTER — Other Ambulatory Visit: Payer: Self-pay

## 2020-10-09 ENCOUNTER — Telehealth: Payer: Self-pay

## 2020-10-09 ENCOUNTER — Other Ambulatory Visit: Payer: Self-pay | Admitting: Family Medicine

## 2020-10-09 ENCOUNTER — Encounter: Payer: Self-pay | Admitting: Physical Therapy

## 2020-10-09 ENCOUNTER — Ambulatory Visit: Payer: Medicare HMO | Attending: Family Medicine | Admitting: Physical Therapy

## 2020-10-09 DIAGNOSIS — M5441 Lumbago with sciatica, right side: Secondary | ICD-10-CM | POA: Diagnosis not present

## 2020-10-09 MED ORDER — TIZANIDINE HCL 4 MG PO TABS: 4.0000 mg | ORAL_TABLET | Freq: Four times a day (QID) | ORAL | 0 refills | Status: DC | PRN

## 2020-10-09 NOTE — Telephone Encounter (Signed)
Zanaflex sent.  Caution sedation.  She has referral in place for PT and ortho.  Heat recommended.  Cannot tolerate steroids and cannot take NSAIDs due to anticoagulation.  Will cc to [facility] to check on referrals.

## 2020-10-09 NOTE — Telephone Encounter (Signed)
Left detailed message to let pt know

## 2020-10-09 NOTE — Telephone Encounter (Signed)
Pt seen last week--muscle relaxer has not been called in to [facility]. Pt is still in pain. gabapentin rx is not working.

## 2020-10-09 NOTE — Therapy (Signed)
[facility] [place], [place], [address] Phone: [phone]   Fax:  [phone]  Physical Therapy Evaluation  Patient Details  Name: [name] MRN: [id number] Date of Birth: [birth year] Referring Provider (PT): Ronnie Doss DO   Encounter Date: 10/09/2020   PT End of Session - 10/09/20 1441    Visit Number 1    Number of Visits 12    Date for PT Re-Evaluation 11/20/20    Authorization Type FOTO AT LEAST EVERY 5TH VISIT.  PROGRESS NOTE AT 10TH VISIT.  KX MODIFIER AFTER 15 VISITS.    PT Start Time 0143    PT Stop Time 0238    PT Time Calculation (min) 55 min    Activity Tolerance Patient tolerated treatment well    Behavior During Therapy [facility] for tasks assessed/performed           Past Medical History:  Diagnosis Date  . COPD (chronic obstructive pulmonary disease) ([place])   . Depression   . DM (diabetes mellitus) ([place])   . DVT (deep venous thrombosis) (HCC)    x5  . Family history of colon cancer   . GERD (gastroesophageal reflux disease)   . Hyperlipemia   . Hypertension   . Hypothyroid   . Obesity   . Osteomyelitis of great toe of right foot ([place]) 07/04/2018  . PVD (peripheral vascular disease) ([place])   . Unilateral complete BKA, right, initial encounter ([place]) 07/11/2018  . Unilateral complete BKA, right, subsequent encounter ([place])   . Uterine cancer ([place])   . UTI (urinary tract infection)     Past Surgical History:  Procedure Laterality Date  . ABDOMINAL HYSTERECTOMY    . AMPUTATION Right 07/08/2018   Procedure: RIGHT BELOW KNEE AMPUTATION;  Surgeon: [name], MD;  Location: [place];  Service: Orthopedics;  Laterality: Right;  . CESAREAN SECTION    . FEMORAL BYPASS     x 5  . LUMBAR DISC SURGERY     L4-L5  . TOE AMPUTATION     right  . TONSILLECTOMY      There were no vitals filed for this visit.    Subjective Assessment - 10/09/20 1453    Subjective COVID-19 screen performed  prior to patient entering clinic.  The patient presents to the clinic today with c/o right sided low back pain going into her right buttock region and posterior thigh.  This has been ongoing for the last three weeks.  Her pain today is a 5/10 but can rise to higher levels when lying on her back with her legs straight.  Ice decreases her pain.    Pertinent History COPD, lumbar surgery, COPD, DM, HTN right BKA.    Patient Stated Goals Get out of pain.    Currently in Pain? Yes    Pain Score 5     Pain Location Back    Pain Orientation Right    Pain Descriptors / Indicators Aching;Sore;Throbbing;Sharp;Numbness;Shooting    Pain Type Acute pain    Pain Radiating Towards Right LE.    Pain Onset 1 to 4 weeks ago    Pain Frequency Constant    Aggravating Factors  See above.    Pain Relieving Factors See above.              [facility] PT Assessment - 10/09/20 0001      Assessment   Medical Diagnosis Acute right-sied LBP with sciatica.    Referring Provider (PT) Ronnie Doss DO  Onset Date/Surgical Date --   ~3 weeks.     Precautions   Precautions None      Restrictions   Weight Bearing Restrictions No      Balance Screen   Has the patient fallen in the past 6 months Yes    How many times? 1.    Has the patient had a decrease in activity level because of a fear of falling?  No    Is the patient reluctant to leave their home because of a fear of falling?  No      Home Ecologist residence      Prior Function   Level of Independence Independent      Posture/Postural Control   Posture/Postural Control Postural limitations    Postural Limitations Decreased lumbar lordosis      ROM / Strength   AROM / PROM / Strength AROM;Strength      AROM   Overall AROM Comments Normal bilateral hip flexion assesed in supine.      Strength   Overall Strength Comments Normal bilateral hip strength.      Palpation   Palpation comment Very tender to palption  over right SIJ.  Tender over right Piriformis with increased tone and tender to palpation over right greater trochanter and posterior to the trochanter.      Ambulation/Gait   Gait Comments The patient walks in some trunk flexion.                      Objective measurements completed on examination: See above findings.       [facility] Adult PT Treatment/Exercise - 10/09/20 0001      Modalities   Modalities Electrical Stimulation      Electrical Stimulation   Electrical Stimulation Location Right SIJ/HIP/Buttock.    Electrical Stimulation Action IFC    Electrical Stimulation Parameters 80-150 Hz x 20 minutes.    Electrical Stimulation Goals Pain;Tone                       PT Long Term Goals - 10/09/20 1520      PT LONG TERM GOAL #1   Title Independent with a HEP.    Time 6    Period Weeks    Status New      PT LONG TERM GOAL #2   Title Eliminate right LE symptoms.    Time 6    Period Weeks    Status New      PT LONG TERM GOAL #3   Title Perform ADL's with pain not > 3/10.    Time 6    Period Weeks    Status New                  Plan - 10/09/20 1516    Clinical Impression Statement The patient presents to OPPT with c/o of right sided low back pain that has been ongoing for about three week.  She was found to be very tender over her right SIJ, Pirifromis and trochanteric region.  She walks in some trunk flexion and has a loss of lumbar lordosis.  Her PMH is remarkable for a previous low back surgery. atient will benefit from skilled physical therapy intervention to address pain and deficits.    Personal Factors and Comorbidities Comorbidity 1;Comorbidity 2;Other    Comorbidities COPD, lumbar surgery, COPD, DM, HTN right BKA.    Examination-Activity Limitations Other    Examination-Participation  Restrictions Other    Stability/Clinical Decision Making Evolving/Moderate complexity    Clinical Decision Making Low    Rehab Potential  Excellent    PT Frequency 2x / week    PT Duration 6 weeks    PT Treatment/Interventions Cryotherapy;Electrical Stimulation;Ultrasound;Moist Heat;ADLs/Self Care Home Management;Iontophoresis 4mg /ml Dexamethasone;Functional mobility training;Therapeutic activities;Therapeutic exercise;Manual techniques;Patient/family education;Passive range of motion;Dry needling    PT Next Visit Plan Combo e'stim/US, STW/M, right piriformis stretching.  Core exercise progression.    Consulted and Agree with Plan of Care Patient           Patient will benefit from skilled therapeutic intervention in order to improve the following deficits and impairments:  Pain,Decreased activity tolerance,Increased muscle spasms  Visit Diagnosis: Acute right-sided low back pain with right-sided sciatica - Plan: PT plan of care cert/re-cert     Problem List Patient Active Problem List   Diagnosis Date Noted  . History of DVT (deep vein thrombosis) 07/03/2019  . Statin intolerance 04/19/2019  . Vitamin D deficiency 12/27/2018  . Type 2 diabetes mellitus with diabetic peripheral angiopathy and gangrene, without long-term current use of insulin ([place]) 12/27/2018  . DVT of axillary vein, chronic, bilateral ([place]) 12/27/2018  . Hypertension associated with chronic kidney disease due to type 2 diabetes mellitus ([place]) 12/27/2018  . Long term current use of anticoagulant 12/27/2018  . Medically noncompliant 12/27/2018  . S/P unilateral BKA (below knee amputation), right ([place])   . Subtherapeutic international normalized ratio (INR)   . Noncompliance   . Leukocytosis   . Acute blood loss anemia   . Diabetes mellitus type 2 in obese ([place])   . Acquired absence of right leg below knee ([place]) 07/08/2018  . Mild protein-calorie malnutrition ([place])   . Intermittent claudication ([place]) 05/11/2017  . Deep vein thrombosis (DVT) of both lower extremities ([place]) 01/05/2017  . Peripheral vascular insufficiency ([place]) 09/25/2015  .  Hypothyroidism 01/29/2014  . Obesity (BMI 30-39.9) 08/16/2013  . Impetigo 07/28/2013  . Chronic obstructive pulmonary disease ([place])   . Malignant neoplasm of uterus ([place])   . GERD (gastroesophageal reflux disease) 10/18/2012  . Hernia, hiatal 10/18/2012  . Hyperlipidemia associated with type 2 diabetes mellitus ([place]) 04/18/2008  . SMOKER 04/18/2008    Odeth Bry, [place] MPT 10/09/2020, 3:24 PM  [facility] [place], [place], [address] Phone: [phone]   Fax:  [phone]  Name: [name] MRN: [id number] Date of Birth: 1961/02/04

## 2020-10-10 ENCOUNTER — Ambulatory Visit: Payer: Medicare HMO | Admitting: Physical Therapy

## 2020-10-10 DIAGNOSIS — M5441 Lumbago with sciatica, right side: Secondary | ICD-10-CM | POA: Diagnosis not present

## 2020-10-10 NOTE — Therapy (Signed)
[facility] [place], [place], [address] Phone: [phone]   Fax:  [phone]  Physical Therapy Treatment  Patient Details  Name: BARBETTE MCGLAUN MRN: [id number] Date of Birth: [birth year] Referring Provider (PT): Ronnie Doss DO   Encounter Date: 10/10/2020   PT End of Session - 10/10/20 1426    Visit Number 2    Number of Visits 12    Date for PT Re-Evaluation 11/20/20    Authorization Type FOTO AT LEAST EVERY 5TH VISIT.  PROGRESS NOTE AT 10TH VISIT.  KX MODIFIER AFTER 15 VISITS.    PT Start Time 0147    PT Stop Time 0232    PT Time Calculation (min) 45 min           Past Medical History:  Diagnosis Date  . COPD (chronic obstructive pulmonary disease) ([place])   . Depression   . DM (diabetes mellitus) ([place])   . DVT (deep venous thrombosis) ([facility])    x5  . Family history of colon cancer   . GERD (gastroesophageal reflux disease)   . Hyperlipemia   . Hypertension   . Hypothyroid   . Obesity   . Osteomyelitis of great toe of right foot ([place]) 07/04/2018  . PVD (peripheral vascular disease) ([place])   . Unilateral complete BKA, right, initial encounter ([place]) 07/11/2018  . Unilateral complete BKA, right, subsequent encounter ([place])   . Uterine cancer ([place])   . UTI (urinary tract infection)     Past Surgical History:  Procedure Laterality Date  . ABDOMINAL HYSTERECTOMY    . AMPUTATION Right 07/08/2018   Procedure: RIGHT BELOW KNEE AMPUTATION;  Surgeon: [name], MD;  Location: [facility];  Service: Orthopedics;  Laterality: Right;  . CESAREAN SECTION    . FEMORAL BYPASS     x 5  . LUMBAR DISC SURGERY     L4-L5  . TOE AMPUTATION     right  . TONSILLECTOMY      There were no vitals filed for this visit.   Subjective Assessment - 10/10/20 1345    Subjective COVID-19 screen performed prior to patient entering clinic. Patient reported ongoing pain in back.    Pertinent History COPD, lumbar surgery, COPD,  DM, HTN right BKA.    Patient Stated Goals Get out of pain.    Currently in Pain? Yes    Pain Score 6     Pain Location Back    Pain Orientation Right    Pain Descriptors / Indicators Discomfort    Pain Type Acute pain    Pain Onset 1 to 4 weeks ago    Pain Frequency Constant    Aggravating Factors  sitting and laying    Pain Relieving Factors standing and hooklying                             [facility] Adult PT Treatment/Exercise - 10/10/20 0001      Exercises   Exercises Lumbar      Lumbar Exercises: Stretches   Piriformis Stretch Right;3 reps;30 seconds      Lumbar Exercises: Aerobic   Nustep L4 x37min UE/LE activity      Lumbar Exercises: Supine   Ab Set 10 reps;3 seconds    Glut Set 3 seconds;10 reps      Modalities   Modalities Ultrasound;Teacher, English as a foreign language Location Right SIJ/HIP/Buttock.    Dealer  Stimulation Action IFC    Electrical Stimulation Parameters 80-150hz  x2min    Electrical Stimulation Goals Pain;Tone      Ultrasound   Ultrasound Location right piriformis area    Ultrasound Parameters combo US/ES @1 .5w/cm2/100%/22mhz x6min    Ultrasound Goals Pain                  PT Education - 10/10/20 1406    Education Details Piriformis stretch    Person(s) Educated Patient    Methods Explanation;Demonstration;Handout    Comprehension Verbalized understanding;Returned demonstration               PT Long Term Goals - 10/09/20 1520      PT LONG TERM GOAL #1   Title Independent with a HEP.    Time 6    Period Weeks    Status New      PT LONG TERM GOAL #2   Title Eliminate right LE symptoms.    Time 6    Period Weeks    Status New      PT LONG TERM GOAL #3   Title Perform ADL's with pain not > 3/10.    Time 6    Period Weeks    Status New                 Plan - 10/10/20 1429    Clinical Impression Statement Patient tolerated treatment well  today. Patient has difficulty with sitting due to pain and required wedge in supine for comfort. HEP provided for piriformis stretch today. Educated patient on stretch and abdominal bracing today. Patient responded well to modalities. Goals progressing this week.    Personal Factors and Comorbidities Comorbidity 1;Comorbidity 2;Other    Comorbidities FOTO......COPD, lumbar surgery, COPD, DM, HTN right BKA.    Examination-Activity Limitations Other    Examination-Participation Restrictions Other    Stability/Clinical Decision Making Evolving/Moderate complexity    Rehab Potential Excellent    PT Frequency 2x / week    PT Duration 6 weeks    PT Treatment/Interventions Cryotherapy;Electrical Stimulation;Ultrasound;Moist Heat;ADLs/Self Care Home Management;Iontophoresis 4mg /ml Dexamethasone;Functional mobility training;Therapeutic activities;Therapeutic exercise;Manual techniques;Patient/family education;Passive range of motion;Dry needling    PT Next Visit Plan cont with Combo e'stim/US, STW/M, right piriformis stretching.  Core exercise progression.    Consulted and Agree with Plan of Care Patient           Patient will benefit from skilled therapeutic intervention in order to improve the following deficits and impairments:  Pain,Decreased activity tolerance,Increased muscle spasms  Visit Diagnosis: Acute right-sided low back pain with right-sided sciatica     Problem List Patient Active Problem List   Diagnosis Date Noted  . History of DVT (deep vein thrombosis) 07/03/2019  . Statin intolerance 04/19/2019  . Vitamin D deficiency 12/27/2018  . Type 2 diabetes mellitus with diabetic peripheral angiopathy and gangrene, without long-term current use of insulin ([place]) 12/27/2018  . DVT of axillary vein, chronic, bilateral ([place]) 12/27/2018  . Hypertension associated with chronic kidney disease due to type 2 diabetes mellitus ([place]) 12/27/2018  . Long term current use of anticoagulant  12/27/2018  . Medically noncompliant 12/27/2018  . S/P unilateral BKA (below knee amputation), right ([place])   . Subtherapeutic international normalized ratio (INR)   . Noncompliance   . Leukocytosis   . Acute blood loss anemia   . Diabetes mellitus type 2 in obese ([place])   . Acquired absence of right leg below knee ([place]) 07/08/2018  . Mild protein-calorie malnutrition ([place])   .  Intermittent claudication ([place]) 05/11/2017  . Deep vein thrombosis (DVT) of both lower extremities ([place]) 01/05/2017  . Peripheral vascular insufficiency ([place]) 09/25/2015  . Hypothyroidism 01/29/2014  . Obesity (BMI 30-39.9) 08/16/2013  . Impetigo 07/28/2013  . Chronic obstructive pulmonary disease ([place])   . Malignant neoplasm of uterus ([place])   . GERD (gastroesophageal reflux disease) 10/18/2012  . Hernia, hiatal 10/18/2012  . Hyperlipidemia associated with type 2 diabetes mellitus ([place]) 04/18/2008  . SMOKER 04/18/2008    Oaklee Sunga P, PTA 10/10/2020, 2:44 PM  [facility] [address]  [place], [place], [address] Phone: [phone]   Fax:  [phone]  Name: YTZEL GUBLER MRN: [id number] Date of Birth: 12/20/1960

## 2020-10-10 NOTE — Patient Instructions (Signed)
   PIRIFORMIS STRETCH MODIFIED 3  While lying on your back and leg crossed on top of your opposite knee, hold your knee with your opposite hand and  bring your knee up and over across your midline towards your opposite shoulder for a stretch felt in the buttock.

## 2020-10-14 ENCOUNTER — Ambulatory Visit: Payer: Medicare HMO | Admitting: Physical Therapy

## 2020-10-14 ENCOUNTER — Other Ambulatory Visit: Payer: Self-pay

## 2020-10-14 DIAGNOSIS — M5441 Lumbago with sciatica, right side: Secondary | ICD-10-CM | POA: Diagnosis not present

## 2020-10-14 NOTE — Therapy (Signed)
[facility] [place], [place], [address] Phone: [phone]   Fax:  [phone]  Physical Therapy Treatment  Patient Details  Name: [name] MRN: [id number] Date of Birth: 15-Mar-1961 Referring Provider (PT): Ronnie Doss DO   Encounter Date: 10/14/2020   PT End of Session - 10/14/20 1547    Visit Number 3    Number of Visits 12    Date for PT Re-Evaluation 11/20/20    Authorization Type FOTO AT LEAST EVERY 5TH VISIT.  PROGRESS NOTE AT 10TH VISIT.  KX MODIFIER AFTER 15 VISITS.    PT Start Time 0230    PT Stop Time 0322    PT Time Calculation (min) 52 min    Activity Tolerance Patient tolerated treatment well    Behavior During Therapy WFL for tasks assessed/performed           Past Medical History:  Diagnosis Date  . COPD (chronic obstructive pulmonary disease) ([place])   . Depression   . DM (diabetes mellitus) ([place])   . DVT (deep venous thrombosis) ([facility])    x5  . Family history of colon cancer   . GERD (gastroesophageal reflux disease)   . Hyperlipemia   . Hypertension   . Hypothyroid   . Obesity   . Osteomyelitis of great toe of right foot ([place]) 07/04/2018  . PVD (peripheral vascular disease) ([place])   . Unilateral complete BKA, right, initial encounter ([place]) 07/11/2018  . Unilateral complete BKA, right, subsequent encounter ([place])   . Uterine cancer ([place])   . UTI (urinary tract infection)     Past Surgical History:  Procedure Laterality Date  . ABDOMINAL HYSTERECTOMY    . AMPUTATION Right 07/08/2018   Procedure: RIGHT BELOW KNEE AMPUTATION;  Surgeon: [name], MD;  Location: [facility];  Service: Orthopedics;  Laterality: Right;  . CESAREAN SECTION    . FEMORAL BYPASS     x 5  . LUMBAR DISC SURGERY     L4-L5  . TOE AMPUTATION     right  . TONSILLECTOMY      There were no vitals filed for this visit.   Subjective Assessment - 10/14/20 1541    Subjective COVID-19 screen performed prior  to patient entering clinic.  About the same.    Pertinent History COPD, lumbar surgery, COPD, DM, HTN right BKA.    Patient Stated Goals Get out of pain.    Currently in Pain? Yes    Pain Score 6     Pain Location Back    Pain Orientation Right    Pain Descriptors / Indicators Discomfort                             [facility] Adult PT Treatment/Exercise - 10/14/20 0001      Modalities   Modalities Electrical Stimulation;Moist Heat;Ultrasound      Moist Heat Therapy   Number Minutes Moist Heat 20 Minutes    Moist Heat Location Lumbar Spine      Electrical Stimulation   Electrical Stimulation Location Right SIJ/Piriformis region.    Electrical Stimulation Action IFC at 40% scan at 80-150 Hz x 20 minutes.    Electrical Stimulation Goals Pain;Tone      Ultrasound   Ultrasound Location Left sdly position with pillow between knees for comfort.    Ultrasound Parameters Combo e'stim/US at 1.50 W/CM2 x 12 minutes.    Ultrasound Goals Pain  Manual Therapy   Manual Therapy Soft tissue mobilization    Soft tissue mobilization STW/M x 11 minutes to patient's right SIJ and Piriformis region.                       PT Long Term Goals - 10/09/20 1520      PT LONG TERM GOAL #1   Title Independent with a HEP.    Time 6    Period Weeks    Status New      PT LONG TERM GOAL #2   Title Eliminate right LE symptoms.    Time 6    Period Weeks    Status New      PT LONG TERM GOAL #3   Title Perform ADL's with pain not > 3/10.    Time 6    Period Weeks    Status New                 Plan - 10/14/20 1546    Clinical Impression Statement Patient very tender to palpation over right SIJ.  She did well with treatment today.    Personal Factors and Comorbidities Comorbidity 1;Comorbidity 2;Other    Comorbidities FOTO......COPD, lumbar surgery, COPD, DM, HTN right BKA.    Examination-Activity Limitations Other    Examination-Participation Restrictions  Other    Stability/Clinical Decision Making Evolving/Moderate complexity    Rehab Potential Excellent    PT Frequency 2x / week    PT Duration 6 weeks    PT Treatment/Interventions Cryotherapy;Electrical Stimulation;Ultrasound;Moist Heat;ADLs/Self Care Home Management;Iontophoresis 4mg /ml Dexamethasone;Functional mobility training;Therapeutic activities;Therapeutic exercise;Manual techniques;Patient/family education;Passive range of motion;Dry needling    PT Next Visit Plan cont with Combo e'stim/US, STW/M, right piriformis stretching.  Core exercise progression.    Consulted and Agree with Plan of Care Patient           Patient will benefit from skilled therapeutic intervention in order to improve the following deficits and impairments:     Visit Diagnosis: Acute right-sided low back pain with right-sided sciatica     Problem List Patient Active Problem List   Diagnosis Date Noted  . History of DVT (deep vein thrombosis) 07/03/2019  . Statin intolerance 04/19/2019  . Vitamin D deficiency 12/27/2018  . Type 2 diabetes mellitus with diabetic peripheral angiopathy and gangrene, without long-term current use of insulin ([place]) 12/27/2018  . DVT of axillary vein, chronic, bilateral ([place]) 12/27/2018  . Hypertension associated with chronic kidney disease due to type 2 diabetes mellitus ([place]) 12/27/2018  . Long term current use of anticoagulant 12/27/2018  . Medically noncompliant 12/27/2018  . S/P unilateral BKA (below knee amputation), right ([place])   . Subtherapeutic international normalized ratio (INR)   . Noncompliance   . Leukocytosis   . Acute blood loss anemia   . Diabetes mellitus type 2 in obese ([place])   . Acquired absence of right leg below knee ([place]) 07/08/2018  . Mild protein-calorie malnutrition ([place])   . Intermittent claudication ([place]) 05/11/2017  . Deep vein thrombosis (DVT) of both lower extremities ([place]) 01/05/2017  . Peripheral vascular insufficiency ([place]) 09/25/2015   . Hypothyroidism 01/29/2014  . Obesity (BMI 30-39.9) 08/16/2013  . Impetigo 07/28/2013  . Chronic obstructive pulmonary disease ([place])   . Malignant neoplasm of uterus ([place])   . GERD (gastroesophageal reflux disease) 10/18/2012  . Hernia, hiatal 10/18/2012  . Hyperlipidemia associated with type 2 diabetes mellitus ([place]) 04/18/2008  . SMOKER 04/18/2008    Johsua Shevlin, [place] MPT 10/14/2020, 3:48  PM  [facility] [place], [place], [address] Phone: [phone]   Fax:  [phone]  Name: [name] MRN: [id number] Date of Birth: 09/02/1960

## 2020-10-16 ENCOUNTER — Other Ambulatory Visit: Payer: Self-pay

## 2020-10-16 ENCOUNTER — Ambulatory Visit: Payer: Medicare HMO | Admitting: Physical Therapy

## 2020-10-16 DIAGNOSIS — M5441 Lumbago with sciatica, right side: Secondary | ICD-10-CM

## 2020-10-16 NOTE — Therapy (Signed)
[facility] [place], [place], [address] Phone: [phone]   Fax:  [phone]  Physical Therapy Treatment  Patient Details  Name: [name] MRN: [id number] Date of Birth: 04-Apr-1961 Referring Provider (PT): Ronnie Doss DO   Encounter Date: 10/16/2020   PT End of Session - 10/16/20 1351    Visit Number 4    Number of Visits 12    Date for PT Re-Evaluation 11/20/20    Authorization Type FOTO 4th visit 41 score  PROGRESS NOTE AT 10TH VISIT.  KX MODIFIER AFTER 15 VISITS.    PT Start Time [id number]    PT Stop Time 0231    PT Time Calculation (min) 45 min    Activity Tolerance Patient tolerated treatment well    Behavior During Therapy [facility] for tasks assessed/performed           Past Medical History:  Diagnosis Date  . COPD (chronic obstructive pulmonary disease) ([place])   . Depression   . DM (diabetes mellitus) ([place])   . DVT (deep venous thrombosis) ([facility])    x5  . Family history of colon cancer   . GERD (gastroesophageal reflux disease)   . Hyperlipemia   . Hypertension   . Hypothyroid   . Obesity   . Osteomyelitis of great toe of right foot ([place]) 07/04/2018  . PVD (peripheral vascular disease) ([place])   . Unilateral complete BKA, right, initial encounter ([place]) 07/11/2018  . Unilateral complete BKA, right, subsequent encounter ([place])   . Uterine cancer ([place])   . UTI (urinary tract infection)     Past Surgical History:  Procedure Laterality Date  . ABDOMINAL HYSTERECTOMY    . AMPUTATION Right 07/08/2018   Procedure: RIGHT BELOW KNEE AMPUTATION;  Surgeon: [name], MD;  Location: [facility];  Service: Orthopedics;  Laterality: Right;  . CESAREAN SECTION    . FEMORAL BYPASS     x 5  . LUMBAR DISC SURGERY     L4-L5  . TOE AMPUTATION     right  . TONSILLECTOMY      There were no vitals filed for this visit.   Subjective Assessment - 10/16/20 1350    Subjective COVID-19 screen performed prior to  patient entering clinic.  Patient arrived with less pain just a soreness in back today.    Pertinent History COPD, lumbar surgery, COPD, DM, HTN right BKA.    Patient Stated Goals Get out of pain.    Currently in Pain? Yes    Pain Score 2     Pain Location Back    Pain Orientation Right    Pain Descriptors / Indicators Sore    Pain Type Acute pain    Pain Onset 1 to 4 weeks ago    Pain Frequency Constant    Aggravating Factors  prolong sitting    Pain Relieving Factors standing                             [facility] Adult PT Treatment/Exercise - 10/16/20 0001      Lumbar Exercises: Aerobic   Nustep L4 x84min UE/LE activity      Lumbar Exercises: Sidelying   Hip Abduction Right;20 reps;3 seconds      Moist Heat Therapy   Number Minutes Moist Heat 15 Minutes    Moist Heat Location Lumbar Spine      Electrical Stimulation   Electrical Stimulation Location Right SIJ/Piriformis region.  Electrical Stimulation Action IFC    Electrical Stimulation Parameters 80-150hz  x79min    Electrical Stimulation Goals Pain;Tone      Ultrasound   Ultrasound Location Rt SIJ    Ultrasound Parameters combo us/es @1 .5w/cm2/100%/46mhz x32min    Ultrasound Goals Pain                       PT Long Term Goals - 10/16/20 1352      PT LONG TERM GOAL #1   Title Independent with a HEP.    Time 6    Period Weeks    Status On-going      PT LONG TERM GOAL #2   Title Eliminate right LE symptoms.    Baseline a little improvement 10/16/20    Time 6    Period Weeks    Status On-going      PT LONG TERM GOAL #3   Title Perform ADL's with pain not > 3/10.    Time 6    Period Weeks    Status On-going                 Plan - 10/16/20 1421    Clinical Impression Statement Patient tolerated treatment well today. Patient feels less pain today and wanted to cont with strengthening and modalities. Patient had palpable pain in right SI today per reported. Patient doing  well with self stretches at home. Patient continues to have some limitations due to pain. goals progressing this week.    Personal Factors and Comorbidities Comorbidity 1;Comorbidity 2;Other    Comorbidities FOTO......COPD, lumbar surgery, COPD, DM, HTN right BKA.    Examination-Activity Limitations Other    Examination-Participation Restrictions Other    Stability/Clinical Decision Making Evolving/Moderate complexity    Rehab Potential Excellent    PT Frequency 2x / week    PT Duration 6 weeks    PT Treatment/Interventions Cryotherapy;Electrical Stimulation;Ultrasound;Moist Heat;ADLs/Self Care Home Management;Iontophoresis 4mg /ml Dexamethasone;Functional mobility training;Therapeutic activities;Therapeutic exercise;Manual techniques;Patient/family education;Passive range of motion;Dry needling    PT Next Visit Plan cont with POC for SIJ pain with stretches and strengthening and  Core exercise progression.    Consulted and Agree with Plan of Care Patient           Patient will benefit from skilled therapeutic intervention in order to improve the following deficits and impairments:  Pain,Decreased activity tolerance,Increased muscle spasms  Visit Diagnosis: Acute right-sided low back pain with right-sided sciatica     Problem List Patient Active Problem List   Diagnosis Date Noted  . History of DVT (deep vein thrombosis) 07/03/2019  . Statin intolerance 04/19/2019  . Vitamin D deficiency 12/27/2018  . Type 2 diabetes mellitus with diabetic peripheral angiopathy and gangrene, without long-term current use of insulin ([place]) 12/27/2018  . DVT of axillary vein, chronic, bilateral ([place]) 12/27/2018  . Hypertension associated with chronic kidney disease due to type 2 diabetes mellitus ([place]) 12/27/2018  . Long term current use of anticoagulant 12/27/2018  . Medically noncompliant 12/27/2018  . S/P unilateral BKA (below knee amputation), right ([place])   . Subtherapeutic international  normalized ratio (INR)   . Noncompliance   . Leukocytosis   . Acute blood loss anemia   . Diabetes mellitus type 2 in obese ([place])   . Acquired absence of right leg below knee ([place]) 07/08/2018  . Mild protein-calorie malnutrition ([place])   . Intermittent claudication ([place]) 05/11/2017  . Deep vein thrombosis (DVT) of both lower extremities ([place]) 01/05/2017  . Peripheral vascular  insufficiency ([place]) 09/25/2015  . Hypothyroidism 01/29/2014  . Obesity (BMI 30-39.9) 08/16/2013  . Impetigo 07/28/2013  . Chronic obstructive pulmonary disease ([place])   . Malignant neoplasm of uterus ([place])   . GERD (gastroesophageal reflux disease) 10/18/2012  . Hernia, hiatal 10/18/2012  . Hyperlipidemia associated with type 2 diabetes mellitus ([place]) 04/18/2008  . SMOKER 04/18/2008    Evanny Ellerbe P, PTA 10/16/2020, 2:36 PM  [facility] [place], [place], [address] Phone: [phone]   Fax:  [phone]  Name: CAELEN HIGINBOTHAM MRN: [id number] Date of Birth: 05-Dec-1960

## 2020-10-21 ENCOUNTER — Other Ambulatory Visit: Payer: Self-pay

## 2020-10-21 ENCOUNTER — Ambulatory Visit: Payer: Medicare HMO | Admitting: Physical Therapy

## 2020-10-21 DIAGNOSIS — M5441 Lumbago with sciatica, right side: Secondary | ICD-10-CM | POA: Diagnosis not present

## 2020-10-21 NOTE — Therapy (Signed)
[facility] [place], [place], [address] Phone: [phone]   Fax:  [phone]  Physical Therapy Treatment  Patient Details  Name: [name] MRN: [id number] Date of Birth: April 07, 1961 Referring Provider (PT): Ronnie Doss DO   Encounter Date: 10/21/2020   PT End of Session - 10/21/20 1340    Visit Number 5    Number of Visits 12    Date for PT Re-Evaluation 11/20/20    Authorization Type FOTO 4th visit 41 score  PROGRESS NOTE AT 10TH VISIT.  KX MODIFIER AFTER 15 VISITS.    PT Start Time 0145    PT Stop Time 0226    PT Time Calculation (min) 41 min    Activity Tolerance Patient tolerated treatment well    Behavior During Therapy [facility] for tasks assessed/performed           Past Medical History:  Diagnosis Date  . COPD (chronic obstructive pulmonary disease) ([place])   . Depression   . DM (diabetes mellitus) ([place])   . DVT (deep venous thrombosis) (HCC)    x5  . Family history of colon cancer   . GERD (gastroesophageal reflux disease)   . Hyperlipemia   . Hypertension   . Hypothyroid   . Obesity   . Osteomyelitis of great toe of right foot ([place]) 07/04/2018  . PVD (peripheral vascular disease) ([place])   . Unilateral complete BKA, right, initial encounter ([place]) 07/11/2018  . Unilateral complete BKA, right, subsequent encounter ([place])   . Uterine cancer ([place])   . UTI (urinary tract infection)     Past Surgical History:  Procedure Laterality Date  . ABDOMINAL HYSTERECTOMY    . AMPUTATION Right 07/08/2018   Procedure: RIGHT BELOW KNEE AMPUTATION;  Surgeon: [name], MD;  Location: [place];  Service: Orthopedics;  Laterality: Right;  . CESAREAN SECTION    . FEMORAL BYPASS     x 5  . LUMBAR DISC SURGERY     L4-L5  . TOE AMPUTATION     right  . TONSILLECTOMY      There were no vitals filed for this visit.   Subjective Assessment - 10/21/20 1339    Subjective COVID-19 screen performed prior to  patient entering clinic.  Patient reported no exercises very fatigue, not sleeping well.    Pertinent History COPD, lumbar surgery, COPD, DM, HTN right BKA.    Patient Stated Goals Get out of pain.    Currently in Pain? Yes    Pain Score 2     Pain Location Back    Pain Orientation Right    Pain Descriptors / Indicators Sore    Pain Type Acute pain    Pain Onset 1 to 4 weeks ago    Pain Frequency Constant    Aggravating Factors  prolong sitting    Pain Relieving Factors standing                             [facility] Adult PT Treatment/Exercise - 10/21/20 0001      Moist Heat Therapy   Number Minutes Moist Heat 15 Minutes    Moist Heat Location Lumbar Spine      Electrical Stimulation   Electrical Stimulation Location Right SIJ/Piriformis region.    Electrical Stimulation Action IFC    Electrical Stimulation Parameters 80-150hz min    Electrical Stimulation Goals Pain;Tone      Ultrasound   Ultrasound Location Rt SIJ  Ultrasound Parameters combo us/es@1 .5w/cm2/100%/40mhz x57min    Ultrasound Goals Pain      Manual Therapy   Manual Therapy Soft tissue mobilization    Manual therapy comments manual STW to right SIJ/glut to reduce pain and tone                       PT Long Term Goals - 10/21/20 1340      PT LONG TERM GOAL #1   Title Independent with a HEP.    Time 6    Period Weeks    Status On-going      PT LONG TERM GOAL #2   Title Eliminate right LE symptoms.    Baseline a little improvement 10/21/20    Time 6    Period Weeks    Status On-going      PT LONG TERM GOAL #3   Title Perform ADL's with pain not > 3/10.    Time 6    Period Weeks    Status On-going                 Plan - 10/21/20 1417    Clinical Impression Statement Patient tolerated treatment well today. patient requested no exercises today. Today focused on modalities and STW. Patient had palpable trigger point today in right glut and pain. Patient has  difficulty with standing due to uncomfortable with prosthetic limb with prolong standing although does have more relief with standing. Goals ongoing today.    Personal Factors and Comorbidities Comorbidity 1;Comorbidity 2;Other    Comorbidities FOTO......COPD, lumbar surgery, COPD, DM, HTN right BKA.    Examination-Activity Limitations Other    Examination-Participation Restrictions Other    Stability/Clinical Decision Making Evolving/Moderate complexity    Rehab Potential Excellent    PT Frequency 2x / week    PT Duration 6 weeks    PT Treatment/Interventions Cryotherapy;Electrical Stimulation;Ultrasound;Moist Heat;ADLs/Self Care Home Management;Iontophoresis 4mg /ml Dexamethasone;Functional mobility training;Therapeutic activities;Therapeutic exercise;Manual techniques;Patient/family education;Passive range of motion;Dry needling    PT Next Visit Plan cont with POC for SIJ pain with stretches and strengthening and  Core exercise progression.    Consulted and Agree with Plan of Care Patient           Patient will benefit from skilled therapeutic intervention in order to improve the following deficits and impairments:  Pain,Decreased activity tolerance,Increased muscle spasms  Visit Diagnosis: Acute right-sided low back pain with right-sided sciatica     Problem List Patient Active Problem List   Diagnosis Date Noted  . History of DVT (deep vein thrombosis) 07/03/2019  . Statin intolerance 04/19/2019  . Vitamin D deficiency 12/27/2018  . Type 2 diabetes mellitus with diabetic peripheral angiopathy and gangrene, without long-term current use of insulin ([place]) 12/27/2018  . DVT of axillary vein, chronic, bilateral ([place]) 12/27/2018  . Hypertension associated with chronic kidney disease due to type 2 diabetes mellitus ([place]) 12/27/2018  . Long term current use of anticoagulant 12/27/2018  . Medically noncompliant 12/27/2018  . S/P unilateral BKA (below knee amputation), right ([place])   .  Subtherapeutic international normalized ratio (INR)   . Noncompliance   . Leukocytosis   . Acute blood loss anemia   . Diabetes mellitus type 2 in obese ([place])   . Acquired absence of right leg below knee ([place]) 07/08/2018  . Mild protein-calorie malnutrition ([place])   . Intermittent claudication ([place]) 05/11/2017  . Deep vein thrombosis (DVT) of both lower extremities ([place]) 01/05/2017  . Peripheral vascular insufficiency ([place]) 09/25/2015  .  Hypothyroidism 01/29/2014  . Obesity (BMI 30-39.9) 08/16/2013  . Impetigo 07/28/2013  . Chronic obstructive pulmonary disease ([place])   . Malignant neoplasm of uterus ([place])   . GERD (gastroesophageal reflux disease) 10/18/2012  . Hernia, hiatal 10/18/2012  . Hyperlipidemia associated with type 2 diabetes mellitus ([place]) 04/18/2008  . SMOKER 04/18/2008    Criston Chancellor P, PTA 10/21/2020, 2:32 PM  [facility] [address] [place], [place], [address] Phone: [phone]   Fax:  [phone]  Name: [name] MRN: [id number] Date of Birth: May 13, 1961

## 2020-10-22 ENCOUNTER — Ambulatory Visit: Payer: Medicare HMO | Admitting: Family Medicine

## 2020-10-23 ENCOUNTER — Encounter: Payer: Medicare HMO | Admitting: Physical Therapy

## 2020-10-24 ENCOUNTER — Ambulatory Visit: Payer: Medicare HMO | Admitting: Physical Therapy

## 2020-10-28 ENCOUNTER — Ambulatory Visit: Payer: Medicare HMO | Admitting: Physical Therapy

## 2020-10-30 ENCOUNTER — Encounter: Payer: Medicare HMO | Admitting: Physical Therapy

## 2020-11-05 ENCOUNTER — Encounter: Payer: Medicare HMO | Admitting: Physical Therapy

## 2020-11-07 ENCOUNTER — Encounter: Payer: Medicare HMO | Admitting: Physical Therapy

## 2020-11-07 ENCOUNTER — Other Ambulatory Visit: Payer: Self-pay | Admitting: Family Medicine

## 2020-11-11 ENCOUNTER — Inpatient Hospital Stay (HOSPITAL_COMMUNITY): Payer: Medicare HMO | Attending: Hematology

## 2020-11-12 ENCOUNTER — Encounter: Payer: Medicare HMO | Admitting: Physical Therapy

## 2020-11-14 ENCOUNTER — Encounter: Payer: Medicare HMO | Admitting: Physical Therapy

## 2020-11-18 ENCOUNTER — Ambulatory Visit (HOSPITAL_COMMUNITY): Payer: Medicare HMO | Admitting: Hematology

## 2020-11-20 ENCOUNTER — Ambulatory Visit: Payer: Medicare HMO | Admitting: Family Medicine

## 2020-12-31 ENCOUNTER — Other Ambulatory Visit: Payer: Self-pay | Admitting: Family Medicine

## 2020-12-31 ENCOUNTER — Ambulatory Visit (INDEPENDENT_AMBULATORY_CARE_PROVIDER_SITE_OTHER): Payer: Medicare HMO | Admitting: Family Medicine

## 2020-12-31 ENCOUNTER — Other Ambulatory Visit: Payer: Self-pay

## 2020-12-31 VITALS — BP 121/71 | HR 73 | Temp 97.1°F | Ht 70.0 in | Wt 240.0 lb

## 2020-12-31 DIAGNOSIS — F17200 Nicotine dependence, unspecified, uncomplicated: Secondary | ICD-10-CM | POA: Diagnosis not present

## 2020-12-31 DIAGNOSIS — J9801 Acute bronchospasm: Secondary | ICD-10-CM

## 2020-12-31 DIAGNOSIS — I129 Hypertensive chronic kidney disease with stage 1 through stage 4 chronic kidney disease, or unspecified chronic kidney disease: Secondary | ICD-10-CM | POA: Diagnosis not present

## 2020-12-31 DIAGNOSIS — E785 Hyperlipidemia, unspecified: Secondary | ICD-10-CM | POA: Diagnosis not present

## 2020-12-31 DIAGNOSIS — E1169 Type 2 diabetes mellitus with other specified complication: Secondary | ICD-10-CM | POA: Diagnosis not present

## 2020-12-31 DIAGNOSIS — E1152 Type 2 diabetes mellitus with diabetic peripheral angiopathy with gangrene: Secondary | ICD-10-CM

## 2020-12-31 DIAGNOSIS — E1122 Type 2 diabetes mellitus with diabetic chronic kidney disease: Secondary | ICD-10-CM | POA: Diagnosis not present

## 2020-12-31 DIAGNOSIS — Z89511 Acquired absence of right leg below knee: Secondary | ICD-10-CM | POA: Diagnosis not present

## 2020-12-31 LAB — BAYER DCA HB A1C WAIVED: HB A1C (BAYER DCA - WAIVED): 7.5 % — ABNORMAL HIGH (ref <7.0)

## 2020-12-31 MED ORDER — SPIRIVA RESPIMAT 2.5 MCG/ACT IN AERS: 2.0000 | INHALATION_SPRAY | Freq: Every day | RESPIRATORY_TRACT | 0 refills | Status: DC

## 2020-12-31 NOTE — Progress Notes (Signed)
Subjective: CC: DM PCP: Janora Norlander, DO ZD:[id number] [name] is a 61 y.o. female presenting to clinic today for:  1. Type 2 Diabetes with hypertension, hyperlipidemia, CKD and h/o RLE BKA:  Compliant with her medications.  She admits that she has not been as strict with her diet as of late but this is primarily due to increased cost of healthy foods.  She finds it cheaper to purchase a $3 meal of [employer] then to purchase a healthy meal from the grocery store most days.  She does try to cook for herself on a regular basis.  Does not consume much sugary beverages.  Her physical activity is somewhat limited by her BKA but she tries to tread water in the pool occasionally.  Last eye exam: needs Last foot exam: UTD Last A1c:  Lab Results  Component Value Date   HGBA1C 6.7 10/01/2020   Nephropathy screen indicated?: UTD Last flu, zoster and/or pneumovax:  Immunization History  Administered Date(s) Administered   PFIZER(Purple Top)SARS-COV-2 Vaccination 01/25/2020, 02/15/2020   Td 06/08/2005    ROS: no CP.  Has chronic dyspnea on exertion and has some Spiriva and albuterol at home but does not use regularly unless she "needs it".    ROS: Per HPI  Allergies  Allergen Reactions   Aleve [Naproxen Sodium] Hives and Itching   Cortisone Swelling    Internal organs   Prednisone Swelling    Makes internal organs swell   Statins Other (See Comments)    Myalgia even with Pravastatin   Vancomycin Itching   Past Medical History:  Diagnosis Date   COPD (chronic obstructive pulmonary disease) (HCC)    Depression    DM (diabetes mellitus) ([place])    DVT (deep venous thrombosis) (HCC)    x5   Family history of colon cancer    GERD (gastroesophageal reflux disease)    Hyperlipemia    Hypertension    Hypothyroid    Obesity    Osteomyelitis of great toe of right foot ([place]) 07/04/2018   PVD (peripheral vascular disease) ([place])    Unilateral complete BKA, right, initial  encounter ([place]) 07/11/2018   Unilateral complete BKA, right, subsequent encounter ([place])    Uterine cancer ([place])    UTI (urinary tract infection)     Current Outpatient Medications:    Accu-Chek Softclix Lancets lancets, Use as instructed to test BGs 1-2 times daily E11.52, Disp: 100 each, Rfl: 12   ascorbic acid (VITAMIN C) 500 MG tablet, Take 500 mg by mouth daily., Disp: , Rfl:    aspirin EC 81 MG EC tablet, Take 1 tablet (81 mg total) by mouth daily., Disp: , Rfl:    Cholecalciferol (VITAMIN D3) 5000 units TABS, Take 1 tablet by mouth daily., Disp: , Rfl:    clopidogrel (PLAVIX) 75 MG tablet, Take 1 tablet (75 mg total) by mouth daily., Disp: 90 tablet, Rfl: 3   diclofenac Sodium (VOLTAREN) 1 % GEL, Apply 2 g topically 4 (four) times daily. Prn pain, Disp: 300 g, Rfl: 1   docusate sodium (COLACE) 50 MG capsule, Take 50 mg by mouth 2 (two) times daily., Disp: , Rfl:    escitalopram (LEXAPRO) 10 MG tablet, Take 1 tablet (10 mg total) by mouth daily., Disp: 90 tablet, Rfl: 3   ezetimibe (ZETIA) 10 MG tablet, Take 1 tablet (10 mg total) by mouth daily., Disp: 90 tablet, Rfl: 3   furosemide (LASIX) 20 MG tablet, Take 1 tablet (20 mg total) by mouth daily., Disp:  90 tablet, Rfl: 3   gabapentin (NEURONTIN) 300 MG capsule, Take 1 capsule (300 mg total) by mouth at bedtime., Disp: 30 capsule, Rfl: 2   glimepiride (AMARYL) 4 MG tablet, TAKE (1) TABLET DAILY WITH BREAKFAST., Disp: 30 tablet, Rfl: 1   glucose blood test strip, accucheck E11.52; Test BGs 1-2 times daily as directed, Disp: 100 each, Rfl: 12   levothyroxine (SYNTHROID) 100 MCG tablet, Take 1 tablet (100 mcg total) by mouth daily before breakfast., Disp: 90 tablet, Rfl: 3   losartan (COZAAR) 50 MG tablet, Take 1 tablet (50 mg total) by mouth daily., Disp: 90 tablet, Rfl: 3   metFORMIN (GLUCOPHAGE XR) 500 MG 24 hr tablet, Take 2 tablets (1,000 mg total) by mouth in the morning and at bedtime., Disp: 360 tablet, Rfl: 2   Omega-3 Fatty Acids  (FISH OIL) 1000 MG CAPS, Take 1,000 mg by mouth in the morning, at noon, in the evening, and at bedtime., Disp: , Rfl:    pantoprazole (PROTONIX) 40 MG tablet, Take 1 tablet (40 mg total) by mouth daily., Disp: 90 tablet, Rfl: 3   Potassium Gluconate 2.5 MEQ TABS, Take 1 tablet by mouth daily., Disp: , Rfl:    rivaroxaban (XARELTO) 20 MG TABS tablet, Take 1 tablet (20 mg total) by mouth daily with supper., Disp: 90 tablet, Rfl: 3   tiZANidine (ZANAFLEX) 4 MG tablet, Take 1 tablet (4 mg total) by mouth every 6 (six) hours as needed for muscle spasms., Disp: 30 tablet, Rfl: 0   tretinoin (RETIN-A) 0.05 % cream, Apply topically at bedtime., Disp: 45 g, Rfl: 3   triamcinolone cream (KENALOG) 0.1 %, Apply topically 2 (two) times daily for up to 10 days per flare, Disp: 30 g, Rfl: 0 Social History   Socioeconomic History   Marital status: Divorced    Spouse name: Geneticist, molecular   Number of children: 1   Years of education: 16   Highest education level: Master's degree (e.g., MA, MS, MEng, MEd, MSW, MBA)  Occupational History   Occupation: Product manager: Art gallery manager   Occupation: retired  Tobacco Use   Smoking status: Every Day    Packs/day: 1.00    Years: 38.00    Pack years: 38.00    Types: Cigarettes   Smokeless tobacco: Never  Vaping Use   Vaping Use: Never used  Substance and Sexual Activity   Alcohol use: No    Alcohol/week: 0.0 standard drinks   Drug use: Yes    Frequency: 4.0 times per week    Types: Marijuana   Sexual activity: Not Currently    Birth control/protection: Surgical  Other Topics Concern   Not on file  Social History Narrative   Not on file   Social Determinants of Health   Financial Resource Strain: Low Risk    Difficulty of Paying Living Expenses: Not hard at all  Food Insecurity: No Food Insecurity   Worried About Charity fundraiser in the Last Year: Never true   [employer] in the Last Year: Never true  Transportation Needs: No  Transportation Needs   Lack of Transportation (Medical): No   Lack of Transportation (Non-Medical): No  Physical Activity: Inactive   Days of Exercise per Week: 0 days   Minutes of Exercise per Session: 0 min  Stress: No Stress Concern Present   Feeling of Stress : Not at all  Social Connections: Moderately Integrated   Frequency of Communication with Friends and Family: More than  three times a week   Frequency of Social Gatherings with Friends and Family: More than three times a week   Attends Religious Services: More than 4 times per year   Active Member of [employer] or Organizations: Yes   Attends Music therapist: More than 4 times per year   Marital Status: Divorced  Human resources officer Violence: Not At Risk   Fear of Current or Ex-Partner: No   Emotionally Abused: No   Physically Abused: No   Sexually Abused: No   Family History  Problem Relation Age of Onset   Colon cancer Brother 97   Hypertension Mother    Hypothyroidism Mother    Heart disease Father    Diabetes Father    Hyperlipidemia Father    Clotting disorder Brother    Diabetes Brother    Heart disease Brother     Objective: Office vital signs reviewed. BP 121/71   Pulse 73   Temp (!) 97.1 F (36.2 C) (Temporal)   Ht '5\' 10"'$  (1.778 m)   Wt 240 lb (108.9 kg)   BMI 34.44 kg/m   Physical Examination:  General: Awake, alert, nontoxic.  Obese.  Smells of tobacco, No acute distress HEENT: Normal; sclera white Cardio: regular rate and rhythm, S1S2 heard, no murmurs appreciated Pulm: Expiratory wheezes noted at the left upper lung fields.  Normal work of breathing on room air. MSK: Right lower extremity surgically absent.  Has prosthesis in place Psych: mood stable Depression screen [facility] 2/9 12/31/2020 10/01/2020 08/14/2020  Decreased Interest 3 0 0  Down, Depressed, Hopeless 0 0 0  PHQ - 2 Score 3 0 0  Altered sleeping 3 0 0  Tired, decreased energy 3 0 0  Change in appetite 3 0 0  Feeling bad or  failure about yourself  0 0 0  Trouble concentrating 0 0 0  Moving slowly or fidgety/restless 0 0 0  Suicidal thoughts 0 0 0  PHQ-9 Score 12 0 0  Difficult doing work/chores Somewhat difficult - -  Some recent data might be hidden   GAD 7 : Generalized Anxiety Score 03/29/2019  Nervous, Anxious, on Edge 0  Control/stop worrying 0  Worry too much - different things 1  Trouble relaxing 0  Restless 0  Easily annoyed or irritable 0  Afraid - awful might happen 0  Total GAD 7 Score 1  Anxiety Difficulty Not difficult at all      Assessment/ Plan: 60 y.o. female   Type 2 diabetes mellitus with diabetic peripheral angiopathy and gangrene, without long-term current use of insulin (HCC) - Plan: Bayer DCA Hb A1c Waived  Hyperlipidemia associated with type 2 diabetes mellitus ([place])  Hypertension associated with chronic kidney disease due to type 2 diabetes mellitus (HCC)  S/P unilateral BKA (below knee amputation), right (HCC)  SMOKER - Plan: Tiotropium Bromide Monohydrate (SPIRIVA RESPIMAT) 2.5 MCG/ACT AERS  Bronchospasm - Plan: Tiotropium Bromide Monohydrate (SPIRIVA RESPIMAT) 2.5 MCG/ACT AERS  Sugars now uncontrolled with A1c rising to 7.5.  Hesitant to add any medication at this time as I know patient can do this with diet alone.  She was resistant to having any additional medications as well.  For now we will continue current course and I given her several links for low cost, low-carb recipes to follow.  She will continue current regimen for cholesterol.  Has had some difficulty tolerating statins in the past due to myalgia.  Blood pressures well controlled.  No changes  Several weeks of samples  of Spiriva Respimat have been provided.  She will reach out to me she finds these to be helpful.  Would have to do a LABA and LAMA long-term if needing anything more than this as she cannot tolerate corticosteroids.  Smoking cessation of course is reinforced  No orders of the defined  types were placed in this encounter.  No orders of the defined types were placed in this encounter.    Janora Norlander, DO [place] [phone]

## 2020-12-31 NOTE — Patient Instructions (Signed)
kokoh[employer].com  [employer]  https://www.diabetes.org/healthy-living/recipes-nutrition/meal-planning/quick-meal-ideas  Sugar is not at goal.  This could be impacting some of your energy.  Work on diet modification (I know you can do it without extra meds)  There are some ideas above.  Spiriva daily for the next few weeks.  Let me know if this is helping and we can try and get [facility] to help [place] get this covered for you.

## 2021-01-01 ENCOUNTER — Other Ambulatory Visit: Payer: Self-pay | Admitting: Family Medicine

## 2021-01-03 ENCOUNTER — Other Ambulatory Visit: Payer: Self-pay | Admitting: Family Medicine

## 2021-01-03 DIAGNOSIS — M5441 Lumbago with sciatica, right side: Secondary | ICD-10-CM

## 2021-01-03 DIAGNOSIS — Z9889 Other specified postprocedural states: Secondary | ICD-10-CM

## 2021-01-07 ENCOUNTER — Telehealth (INDEPENDENT_AMBULATORY_CARE_PROVIDER_SITE_OTHER): Payer: Medicare HMO | Admitting: Nurse Practitioner

## 2021-01-07 ENCOUNTER — Encounter: Payer: Self-pay | Admitting: Nurse Practitioner

## 2021-01-07 DIAGNOSIS — L0291 Cutaneous abscess, unspecified: Secondary | ICD-10-CM | POA: Insufficient documentation

## 2021-01-07 HISTORY — DX: Cutaneous abscess, unspecified: L02.91

## 2021-01-07 MED ORDER — CEPHALEXIN 500 MG PO CAPS: 500.0000 mg | ORAL_CAPSULE | Freq: Four times a day (QID) | ORAL | 0 refills | Status: DC

## 2021-01-07 NOTE — Assessment & Plan Note (Signed)
Abscess right upper stump(leg) not new for patient.  Patient is reporting intermittent pus when skin is palpated.  No pain, redness or rash around abscess.  Started patient on Keflex, provided education on signs and symptoms of worsening infection.  Follow-up as needed.  Rx sent to pharmacy.

## 2021-01-07 NOTE — Progress Notes (Signed)
Virtual Visit via Video Note   This visit type was conducted due to national recommendations for restrictions regarding the COVID-19 Pandemic (e.g. social distancing) in an effort to limit this patient's exposure and mitigate transmission in our community.  Due to her co-morbid illnesses, this patient is at least at moderate risk for complications without adequate follow up.  This format is felt to be most appropriate for this patient at this time.  All issues noted in this document were discussed and addressed.  A limited physical exam was performed with this format.  A verbal consent was obtained for the virtual visit.   Date:  01/07/2021   ID:  [name], DOB [birth year], MRN [id number]  Patient Location: Home Provider Location: Office/Clinic  PCP:  Janora Norlander, DO   Evaluation Performed:  Acute visit  Chief Complaint:  Abscess:  History of Present Illness:    [name] is a 60 y.o. female Patient presents for evaluation of a cutaneous abscess. Lesion is located in the right stump area. Onset was a few days ago. Symptoms have been intermittent. Abscess has associated symptoms of spontaneous drainage. Patient does have previous history of cutaneous abscesses. Patient does have diabetes.  The patient does not have symptoms concerning for COVID-19 infection (fever, chills, cough, or new shortness of breath).    Past Medical History:  Diagnosis Date   COPD (chronic obstructive pulmonary disease) (HCC)    Depression    DM (diabetes mellitus) ([place])    DVT (deep venous thrombosis) (HCC)    x5   Family history of colon cancer    GERD (gastroesophageal reflux disease)    Hyperlipemia    Hypertension    Hypothyroid    Obesity    Osteomyelitis of great toe of right foot ([place]) 07/04/2018   PVD (peripheral vascular disease) ([place])    Unilateral complete BKA, right, initial encounter ([place]) 07/11/2018   Unilateral complete BKA, right, subsequent encounter  ([place])    Uterine cancer ([place])    UTI (urinary tract infection)     Past Surgical History:  Procedure Laterality Date   ABDOMINAL HYSTERECTOMY     AMPUTATION Right 07/08/2018   Procedure: RIGHT BELOW KNEE AMPUTATION;  Surgeon: [name], MD;  Location: [facility];  Service: Orthopedics;  Laterality: Right;   CESAREAN SECTION     FEMORAL BYPASS     x 5   LUMBAR DISC SURGERY     L4-L5   TOE AMPUTATION     right   TONSILLECTOMY      Family History  Problem Relation Age of Onset   Colon cancer Brother 85   Hypertension Mother    Hypothyroidism Mother    Heart disease Father    Diabetes Father    Hyperlipidemia Father    Clotting disorder Brother    Diabetes Brother    Heart disease Brother     Social History   Socioeconomic History   Marital status: Divorced    Spouse name: Geneticist, molecular   Number of children: 1   Years of education: 73   Highest education level: Conservator, museum/gallery (e.g., MA, MS, MEng, MEd, MSW, MBA)  Occupational History   Occupation: Product manager: Art gallery manager   Occupation: retired  Tobacco Use   Smoking status: Every Day    Packs/day: 1.00    Years: 38.00    Pack years: 38.00    Types: Cigarettes   Smokeless tobacco: Never  Vaping Use   Vaping  Use: Never used  Substance and Sexual Activity   Alcohol use: No    Alcohol/week: 0.0 standard drinks   Drug use: Yes    Frequency: 4.0 times per week    Types: Marijuana   Sexual activity: Not Currently    Birth control/protection: Surgical  Other Topics Concern   Not on file  Social History Narrative   Not on file   Social Determinants of Health   Financial Resource Strain: Low Risk    Difficulty of Paying Living Expenses: Not hard at all  Food Insecurity: No Food Insecurity   Worried About Charity fundraiser in the Last Year: Never true   Ran Out of Food in the Last Year: Never true  Transportation Needs: No Transportation Needs   Lack of Transportation (Medical): No   Lack of  Transportation (Non-Medical): No  Physical Activity: Inactive   Days of Exercise per Week: 0 days   Minutes of Exercise per Session: 0 min  Stress: No Stress Concern Present   Feeling of Stress : Not at all  Social Connections: Moderately Integrated   Frequency of Communication with Friends and Family: More than three times a week   Frequency of Social Gatherings with Friends and Family: More than three times a week   Attends Religious Services: More than 4 times per year   Active Member of [employer] or Organizations: Yes   Attends Music therapist: More than 4 times per year   Marital Status: Divorced  Human resources officer Violence: Not At Risk   Fear of Current or Ex-Partner: No   Emotionally Abused: No   Physically Abused: No   Sexually Abused: No    Outpatient Medications Prior to Visit  Medication Sig Dispense Refill   Accu-Chek Softclix Lancets lancets Use as instructed to test BGs 1-2 times daily E11.52 100 each 12   ascorbic acid (VITAMIN C) 500 MG tablet Take 500 mg by mouth daily.     aspirin EC 81 MG EC tablet Take 1 tablet (81 mg total) by mouth daily.     Cholecalciferol (VITAMIN D3) 5000 units TABS Take 1 tablet by mouth daily.     clopidogrel (PLAVIX) 75 MG tablet Take 1 tablet (75 mg total) by mouth daily. 90 tablet 3   diclofenac Sodium (VOLTAREN) 1 % GEL Apply 2 g topically 4 (four) times daily. Prn pain 300 g 1   docusate sodium (COLACE) 50 MG capsule Take 50 mg by mouth 2 (two) times daily.     escitalopram (LEXAPRO) 10 MG tablet Take 1 tablet (10 mg total) by mouth daily. 90 tablet 3   ezetimibe (ZETIA) 10 MG tablet Take 1 tablet (10 mg total) by mouth daily. 90 tablet 3   furosemide (LASIX) 20 MG tablet Take 1 tablet (20 mg total) by mouth daily. 90 tablet 3   gabapentin (NEURONTIN) 300 MG capsule TAKE 1 CAPSULE AT BEDTIME 30 capsule 2   glimepiride (AMARYL) 4 MG tablet TAKE (1) TABLET DAILY WITH BREAKFAST. 30 tablet 2   glucose blood test strip accucheck  E11.52; Test BGs 1-2 times daily as directed 100 each 12   levothyroxine (SYNTHROID) 100 MCG tablet Take 1 tablet (100 mcg total) by mouth daily before breakfast. 90 tablet 3   losartan (COZAAR) 50 MG tablet TAKE 1 TABLET DAILY 90 tablet 0   metFORMIN (GLUCOPHAGE XR) 500 MG 24 hr tablet Take 2 tablets (1,000 mg total) by mouth in the morning and at bedtime. 360 tablet 2  Omega-3 Fatty Acids (FISH OIL) 1000 MG CAPS Take 1,000 mg by mouth in the morning, at noon, in the evening, and at bedtime.     pantoprazole (PROTONIX) 40 MG tablet Take 1 tablet (40 mg total) by mouth daily. 90 tablet 3   Potassium Gluconate 2.5 MEQ TABS Take 1 tablet by mouth daily.     rivaroxaban (XARELTO) 20 MG TABS tablet Take 1 tablet (20 mg total) by mouth daily with supper. 90 tablet 3   Tiotropium Bromide Monohydrate (SPIRIVA RESPIMAT) 2.5 MCG/ACT AERS Inhale 2 puffs into the lungs daily. 8 g 0   tretinoin (RETIN-A) 0.05 % cream Apply topically at bedtime. 45 g 3   triamcinolone cream (KENALOG) 0.1 % Apply topically 2 (two) times daily for up to 10 days per flare 30 g 0   No facility-administered medications prior to visit.    Allergies:   Aleve [naproxen sodium], Cortisone, Prednisone, Statins, and Vancomycin   Social History   Tobacco Use   Smoking status: Every Day    Packs/day: 1.00    Years: 38.00    Pack years: 38.00    Types: Cigarettes   Smokeless tobacco: Never  Vaping Use   Vaping Use: Never used  Substance Use Topics   Alcohol use: No    Alcohol/week: 0.0 standard drinks   Drug use: Yes    Frequency: 4.0 times per week    Types: Marijuana     Review of Systems  Constitutional:  Negative for fever.  HENT: Negative.    Eyes: Negative.   Respiratory: Negative.    Cardiovascular: Negative.   Genitourinary: Negative.   Musculoskeletal:  Negative for back pain and joint pain.  Skin:  Negative for itching and rash.       Abscess  All other systems reviewed and are negative.   Labs/Other  Tests and Data Reviewed:    Recent Labs: 07/19/2020: ALT 37; BUN 12; Creatinine, Ser 0.66; Hemoglobin 15.5; Platelets 267; Potassium 4.1; Sodium 139 10/01/2020: TSH 1.110   Recent Lipid Panel Lab Results  Component Value Date/Time   CHOL 228 (H) 06/26/2020 09:32 AM   CHOL 136 12/21/2012 05:07 PM   TRIG 249 (H) 06/26/2020 09:32 AM   TRIG 192 (H) 04/21/2013 03:12 PM   TRIG 164 (H) 12/21/2012 05:07 PM   HDL 29 (L) 06/26/2020 09:32 AM   HDL 32 (L) 04/21/2013 03:12 PM   HDL 27 (L) 12/21/2012 05:07 PM   CHOLHDL 7.9 (H) 06/26/2020 09:32 AM   LDLCALC 153 (H) 06/26/2020 09:32 AM   LDLCALC 104 (H) 04/21/2013 03:12 PM   LDLCALC 76 12/21/2012 05:07 PM    Wt Readings from Last 3 Encounters:  12/31/20 240 lb (108.9 kg)  10/01/20 230 lb 6.4 oz (104.5 kg)  08/14/20 236 lb (107 kg)     Objective:    Vital Signs:  There were no vitals taken for this visit.   Physical Exam Vitals and nursing note reviewed.  Constitutional:      Appearance: Normal appearance.  Eyes:     Conjunctiva/sclera: Conjunctivae normal.  Skin:    Findings: Abscess present.       Neurological:     Mental Status: She is alert.     Video visit physical exam limited.  ASSESSMENT & PLAN:   1. Abscess - cephALEXin (KEFLEX) 500 MG capsule; Take 1 capsule (500 mg total) by mouth 4 (four) times daily.  Dispense: 20 capsule; Refill: 0   No orders of the defined types were placed  in this encounter.  Abscess Abscess right upper stump(leg) not new for patient.  Patient is reporting intermittent pus when skin is palpated.  No pain, redness or rash around abscess.  Started patient on Keflex, provided education on signs and symptoms of worsening infection.  Follow-up as needed.  Rx sent to pharmacy.   Meds ordered this encounter  Medications   cephALEXin (KEFLEX) 500 MG capsule    Sig: Take 1 capsule (500 mg total) by mouth 4 (four) times daily.    Dispense:  20 capsule    Refill:  0    Order Specific Question:    Supervising Provider    Answer:   Janora Norlander [id number]    COVID-19 Education: The signs and symptoms of COVID-19 were discussed with the patient and how to seek care for testing (follow up with PCP or arrange E-visit). The importance of social distancing was discussed today.  Time:   Today, I have spent 12 minutes with the patient with telehealth technology discussing the above problems.    Follow Up:  In Person prn  Signed, Ivy Lynn, NP  01/07/2021 10:45 AM    [place]

## 2021-01-13 ENCOUNTER — Ambulatory Visit: Payer: Medicare HMO | Admitting: Physician Assistant

## 2021-01-13 ENCOUNTER — Ambulatory Visit (INDEPENDENT_AMBULATORY_CARE_PROVIDER_SITE_OTHER): Payer: Medicare HMO

## 2021-01-13 ENCOUNTER — Encounter: Payer: Self-pay | Admitting: Physician Assistant

## 2021-01-13 DIAGNOSIS — Z89511 Acquired absence of right leg below knee: Secondary | ICD-10-CM | POA: Diagnosis not present

## 2021-01-13 MED ORDER — DOXYCYCLINE HYCLATE 100 MG PO TABS: 100.0000 mg | ORAL_TABLET | Freq: Two times a day (BID) | ORAL | 0 refills | Status: DC

## 2021-01-13 NOTE — Progress Notes (Signed)
Office Visit Note   Patient: [name]           Date of Birth: Apr 10, 1961           MRN: [id number] Visit Date: 01/13/2021              Requested by: Janora Norlander, DO [place],  [place] [id number] PCP: Janora Norlander, DO  No chief complaint on file.     HPI: Patient is a pleasant 60 year old woman who is over 2 years status post right below-knee amputation.  She has had 2 episodes in the last several months of pain and swelling and pus coming out of the distal aspect of her wound.  The first time she was treated in April with doxycycline by her primary care provider and this seemed to take care of the infection.  The second time she was given cephalexin most recently and it has not helped.  Both times she relates of pus coming out of the wound.  Assessment & Plan: Visit Diagnoses:  1. Acquired absence of right leg below knee ([place])     Plan: Patient will be placed on a course of doxycycline follow-up in 1 week.  Cultures were obtained today we will review this with her once the seventh Sativex are back.  Should cleanse the area every day.  She has been given strict return precautions such as fever chills.  Follow-Up Instructions: No follow-ups on file.   Ortho Exam  Patient is alert, oriented, no adenopathy, well-dressed, normal affect, normal respiratory effort. She has no ascending cellulitis but she is focally an tender at the distal end of her stump.  She does have a small area of fluctuance there and a scab.  After obtaining verbal consent this area was prepped with Betadine times twice and alcohol.  3 cc of purulent fluid was aspirated and sent for culture  Imaging: No results found. No images are attached to the encounter.  Labs: Lab Results  Component Value Date   HGBA1C 7.5 (H) 12/31/2020   HGBA1C 6.7 10/01/2020   HGBA1C 9.2 (H) 06/26/2020   ESRSEDRATE 19 10/25/2012   CRP 0.6 10/25/2012   LABURIC 5.7 12/10/2006   REPTSTATUS  07/09/2018 FINAL 07/04/2018   CULT NO GROWTH 5 DAYS 07/04/2018     Lab Results  Component Value Date   ALBUMIN 3.6 07/19/2020   ALBUMIN 3.7 (L) 06/26/2020   ALBUMIN 3.9 12/27/2019    Lab Results  Component Value Date   MG 1.7 07/08/2018   Lab Results  Component Value Date   VD25OH 67.6 03/29/2019   VD25OH 72.2 12/27/2018   VD25OH 56.8 11/09/2018    No results found for: PREALBUMIN CBC EXTENDED Latest Ref Rng & Units 07/19/2020 06/26/2020 07/03/2019  WBC 4.0 - 10.5 K/uL 10.5 11.5(H) 12.9(H)  RBC 3.87 - 5.11 MIL/uL 5.35(H) 5.08 4.95  HGB 12.0 - 15.0 g/dL 15.5(H) 14.7 14.4  HCT 36.0 - 46.0 % 47.7(H) 44.0 42.0  PLT 150 - 400 K/uL 267 273 300  NEUTROABS 1.7 - 7.7 K/uL 5.3 - 6.9  LYMPHSABS 0.7 - 4.0 K/uL 3.9 - 4.6(H)     There is no height or weight on file to calculate BMI.  Orders:  Orders Placed This Encounter  Procedures   XR Tibia/Fibula Right   Meds ordered this encounter  Medications   doxycycline (VIBRA-TABS) 100 MG tablet    Sig: Take 1 tablet (100 mg total) by mouth 2 (two) times daily.  Dispense:  60 tablet    Refill:  0     Procedures: No procedures performed  Clinical Data: No additional findings.  ROS:  All other systems negative, except as noted in the HPI. Review of Systems  Objective: Vital Signs: There were no vitals taken for this visit.  Specialty Comments:  No specialty comments available.  PMFS History: Patient Active Problem List   Diagnosis Date Noted   Abscess 01/07/2021   History of DVT (deep vein thrombosis) 07/03/2019   Statin intolerance 04/19/2019   Vitamin D deficiency 12/27/2018   Type 2 diabetes mellitus with diabetic peripheral angiopathy and gangrene, without long-term current use of insulin ([place]) 12/27/2018   DVT of axillary vein, chronic, bilateral ([place]) 12/27/2018   Hypertension associated with chronic kidney disease due to type 2 diabetes mellitus ([place]) 12/27/2018   Long term current use of anticoagulant  12/27/2018   Medically noncompliant 12/27/2018   S/P unilateral BKA (below knee amputation), right (HCC)    Subtherapeutic international normalized ratio (INR)    Noncompliance    Leukocytosis    Acute blood loss anemia    Diabetes mellitus type 2 in obese [facility])    Acquired absence of right leg below knee ([place]) 07/08/2018   Mild protein-calorie malnutrition (HCC)    Intermittent claudication ([place]) 05/11/2017   Deep vein thrombosis (DVT) of both lower extremities ([place]) 01/05/2017   Peripheral vascular insufficiency ([place]) 09/25/2015   Hypothyroidism 01/29/2014   Obesity (BMI 30-39.9) 08/16/2013   Impetigo 07/28/2013   Chronic obstructive pulmonary disease ([place])    Malignant neoplasm of uterus ([place])    GERD (gastroesophageal reflux disease) 10/18/2012   Hernia, hiatal 10/18/2012   Hyperlipidemia associated with type 2 diabetes mellitus ([place]) 04/18/2008   SMOKER 04/18/2008   Past Medical History:  Diagnosis Date   COPD (chronic obstructive pulmonary disease) (HCC)    Depression    DM (diabetes mellitus) (HCC)    DVT (deep venous thrombosis) (HCC)    x5   Family history of colon cancer    GERD (gastroesophageal reflux disease)    Hyperlipemia    Hypertension    Hypothyroid    Obesity    Osteomyelitis of great toe of right foot ([place]) 07/04/2018   PVD (peripheral vascular disease) ([place])    Unilateral complete BKA, right, initial encounter ([place]) 07/11/2018   Unilateral complete BKA, right, subsequent encounter ([place])    Uterine cancer ([place])    UTI (urinary tract infection)     Family History  Problem Relation Age of Onset   Colon cancer Brother 54   Hypertension Mother    Hypothyroidism Mother    Heart disease Father    Diabetes Father    Hyperlipidemia Father    Clotting disorder Brother    Diabetes Brother    Heart disease Brother     Past Surgical History:  Procedure Laterality Date   ABDOMINAL HYSTERECTOMY     AMPUTATION Right 07/08/2018   Procedure: RIGHT BELOW KNEE  AMPUTATION;  Surgeon: [name], MD;  Location: [facility];  Service: Orthopedics;  Laterality: Right;   CESAREAN SECTION     FEMORAL BYPASS     x 5   LUMBAR DISC SURGERY     L4-L5   TOE AMPUTATION     right   TONSILLECTOMY     Social History   Occupational History   Occupation: Product manager: [employer]   Occupation: retired  Tobacco Use   Smoking status: Every Day  Packs/day: 1.00    Years: 38.00    Pack years: 38.00    Types: Cigarettes   Smokeless tobacco: Never  Vaping Use   Vaping Use: Never used  Substance and Sexual Activity   Alcohol use: No    Alcohol/week: 0.0 standard drinks   Drug use: Yes    Frequency: 4.0 times per week    Types: Marijuana   Sexual activity: Not Currently    Birth control/protection: Surgical

## 2021-01-15 ENCOUNTER — Other Ambulatory Visit: Payer: Self-pay | Admitting: Family Medicine

## 2021-01-15 DIAGNOSIS — F339 Major depressive disorder, recurrent, unspecified: Secondary | ICD-10-CM

## 2021-01-15 DIAGNOSIS — K219 Gastro-esophageal reflux disease without esophagitis: Secondary | ICD-10-CM

## 2021-01-16 LAB — WOUND CULTURE
MICRO NUMBER:: 12213572
SPECIMEN QUALITY:: ADEQUATE

## 2021-01-20 ENCOUNTER — Encounter: Payer: Self-pay | Admitting: Orthopedic Surgery

## 2021-01-20 ENCOUNTER — Other Ambulatory Visit: Payer: Self-pay

## 2021-01-20 ENCOUNTER — Ambulatory Visit: Payer: Medicare HMO | Admitting: Orthopedic Surgery

## 2021-01-20 DIAGNOSIS — I739 Peripheral vascular disease, unspecified: Secondary | ICD-10-CM | POA: Diagnosis not present

## 2021-01-20 DIAGNOSIS — M86261 Subacute osteomyelitis, right tibia and fibula: Secondary | ICD-10-CM

## 2021-01-20 DIAGNOSIS — Z89511 Acquired absence of right leg below knee: Secondary | ICD-10-CM

## 2021-01-20 DIAGNOSIS — E1142 Type 2 diabetes mellitus with diabetic polyneuropathy: Secondary | ICD-10-CM | POA: Diagnosis not present

## 2021-01-20 NOTE — Progress Notes (Signed)
Office Visit Note   Patient: [name]           Date of Birth: Jan 24, 1961           MRN: [id number] Visit Date: 01/20/2021              Requested by: Janora Norlander, DO [place],   [id number] PCP: Janora Norlander, DO  Chief Complaint  Patient presents with   Right Leg - Follow-up      HPI: Patient is a 60 year old woman who is about 2 years status post right transtibial amputation.  Patient states for the last several months she has been having increasing purulent drainage swelling and pain.  She has been on several courses of oral antibiotics still with persistent drainage swelling and pain.  Assessment & Plan: Visit Diagnoses:  1. Acquired absence of right leg below knee ([place])   2. Type 2 diabetes mellitus with diabetic polyneuropathy, without long-term current use of insulin ([place])   3. Peripheral vascular insufficiency ([place])   4. Subacute osteomyelitis of right tibia [facility])     Plan: Discussed with the lytic changes to the distal tibia the best option would be to proceed with a revision of the right transtibial amputation we will try to set this up for Friday.  Anticipate overnight observation.  Follow-Up Instructions: Return in about 2 weeks (around 02/03/2021).   Ortho Exam  Patient is alert, oriented, no adenopathy, well-dressed, normal affect, normal respiratory effort. Examination patient has a small amount of purulent drainage from the end of the residual limb directly over the distal tibia.  Radiograph shows a punched-out lytic lesion of the tibia most likely consistent with osteomyelitis with the purulent drainage.  Cultures have been positive for staph.  There is no ascending cellulitis.  Imaging: No results found. No images are attached to the encounter.  Labs: Lab Results  Component Value Date   HGBA1C 7.5 (H) 12/31/2020   HGBA1C 6.7 10/01/2020   HGBA1C 9.2 (H) 06/26/2020   ESRSEDRATE 19 10/25/2012   CRP 0.6  10/25/2012   LABURIC 5.7 12/10/2006   REPTSTATUS 07/09/2018 FINAL 07/04/2018   CULT NO GROWTH 5 DAYS 07/04/2018     Lab Results  Component Value Date   ALBUMIN 3.6 07/19/2020   ALBUMIN 3.7 (L) 06/26/2020   ALBUMIN 3.9 12/27/2019    Lab Results  Component Value Date   MG 1.7 07/08/2018   Lab Results  Component Value Date   VD25OH 67.6 03/29/2019   VD25OH 72.2 12/27/2018   VD25OH 56.8 11/09/2018    No results found for: PREALBUMIN CBC EXTENDED Latest Ref Rng & Units 07/19/2020 06/26/2020 07/03/2019  WBC 4.0 - 10.5 K/uL 10.5 11.5(H) 12.9(H)  RBC 3.87 - 5.11 MIL/uL 5.35(H) 5.08 4.95  HGB 12.0 - 15.0 g/dL 15.5(H) 14.7 14.4  HCT 36.0 - 46.0 % 47.7(H) 44.0 42.0  PLT 150 - 400 K/uL 267 273 300  NEUTROABS 1.7 - 7.7 K/uL 5.3 - 6.9  LYMPHSABS 0.7 - 4.0 K/uL 3.9 - 4.6(H)     There is no height or weight on file to calculate BMI.  Orders:  No orders of the defined types were placed in this encounter.  No orders of the defined types were placed in this encounter.    Procedures: No procedures performed  Clinical Data: No additional findings.  ROS:  All other systems negative, except as noted in the HPI. Review of Systems  Objective: Vital Signs: There were no vitals  taken for this visit.  Specialty Comments:  No specialty comments available.  PMFS History: Patient Active Problem List   Diagnosis Date Noted   Abscess 01/07/2021   History of DVT (deep vein thrombosis) 07/03/2019   Statin intolerance 04/19/2019   Vitamin D deficiency 12/27/2018   Type 2 diabetes mellitus with diabetic peripheral angiopathy and gangrene, without long-term current use of insulin ([place]) 12/27/2018   DVT of axillary vein, chronic, bilateral ([place]) 12/27/2018   Hypertension associated with chronic kidney disease due to type 2 diabetes mellitus ([place]) 12/27/2018   Long term current use of anticoagulant 12/27/2018   Medically noncompliant 12/27/2018   S/P unilateral BKA (below knee  amputation), right (HCC)    Subtherapeutic international normalized ratio (INR)    Noncompliance    Leukocytosis    Acute blood loss anemia    Diabetes mellitus type 2 in obese [facility])    Acquired absence of right leg below knee ([place]) 07/08/2018   Mild protein-calorie malnutrition (HCC)    Intermittent claudication ([place]) 05/11/2017   Deep vein thrombosis (DVT) of both lower extremities ([place]) 01/05/2017   Peripheral vascular insufficiency ([place]) 09/25/2015   Hypothyroidism 01/29/2014   Obesity (BMI 30-39.9) 08/16/2013   Impetigo 07/28/2013   Chronic obstructive pulmonary disease ([place])    Malignant neoplasm of uterus ([place])    GERD (gastroesophageal reflux disease) 10/18/2012   Hernia, hiatal 10/18/2012   Hyperlipidemia associated with type 2 diabetes mellitus ([place]) 04/18/2008   SMOKER 04/18/2008   Past Medical History:  Diagnosis Date   COPD (chronic obstructive pulmonary disease) (HCC)    Depression    DM (diabetes mellitus) (HCC)    DVT (deep venous thrombosis) (HCC)    x5   Family history of colon cancer    GERD (gastroesophageal reflux disease)    Hyperlipemia    Hypertension    Hypothyroid    Obesity    Osteomyelitis of great toe of right foot ([place]) 07/04/2018   PVD (peripheral vascular disease) ([place])    Unilateral complete BKA, right, initial encounter ([place]) 07/11/2018   Unilateral complete BKA, right, subsequent encounter ([place])    Uterine cancer ([place])    UTI (urinary tract infection)     Family History  Problem Relation Age of Onset   Colon cancer Brother 22   Hypertension Mother    Hypothyroidism Mother    Heart disease Father    Diabetes Father    Hyperlipidemia Father    Clotting disorder Brother    Diabetes Brother    Heart disease Brother     Past Surgical History:  Procedure Laterality Date   ABDOMINAL HYSTERECTOMY     AMPUTATION Right 07/08/2018   Procedure: RIGHT BELOW KNEE AMPUTATION;  Surgeon: [name], MD;  Location: [facility];  Service:  Orthopedics;  Laterality: Right;   CESAREAN SECTION     FEMORAL BYPASS     x 5   LUMBAR DISC SURGERY     L4-L5   TOE AMPUTATION     right   TONSILLECTOMY     Social History   Occupational History   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

## 2021-01-21 ENCOUNTER — Other Ambulatory Visit: Payer: Self-pay | Admitting: Physician Assistant

## 2021-01-22 ENCOUNTER — Telehealth: Payer: Self-pay | Admitting: Orthopedic Surgery

## 2021-01-22 ENCOUNTER — Telehealth: Payer: Self-pay | Admitting: Family Medicine

## 2021-01-22 NOTE — Telephone Encounter (Signed)
Pt called and states he has surgery on Friday 01/24/2021 and nobody has called and talked to him about stopping any of the medications that he is on. He states he is on a blood thinner and would like someone to call him back.   [phone]

## 2021-01-22 NOTE — Telephone Encounter (Signed)
Pt called stating that she is having surgery on Friday, getting her right leg amputated below the knee, and needs to know if she should stop taking her blood thinner or any of her other meds before surgery?  Please advise and call patient.

## 2021-01-22 NOTE — Telephone Encounter (Signed)
Patient states she is on Plavix, Aspirin and was recently put on Xarelto and has not received any instructions to hold or continue. It is also noted in the chart patient has called her PCP but has not received any guidance. I have spoken with Ebony Hail in short stay anesthesia and she said in many cases this decision will be at the discretion of the doctor doing the surgery.  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    Pt's cb  [phone]

## 2021-01-22 NOTE — Telephone Encounter (Signed)
Can you please see below. When will the hospital call with instructions  or is there a number that she can call?

## 2021-01-22 NOTE — Telephone Encounter (Signed)
Can you please see below and advise. Pt wants a call today

## 2021-01-22 NOTE — Telephone Encounter (Signed)
She may discontinue her Xarelto 1 day prior to the scheduled surgery.  She may need preop clearance.  Please make sure that she has scheduled this appropriately if needed

## 2021-01-23 ENCOUNTER — Other Ambulatory Visit: Payer: Self-pay

## 2021-01-23 ENCOUNTER — Encounter (HOSPITAL_COMMUNITY): Payer: Self-pay | Admitting: Orthopedic Surgery

## 2021-01-23 NOTE — Telephone Encounter (Signed)
Pt aware of provider feedback and also advised to call surgeon and pt voiced understanding.

## 2021-01-23 NOTE — Progress Notes (Signed)
Spoke with pt. Per Dr. Sharol Given instructed pt last dose of xarelto was 01/21/21. Pt okay to stay on Plavix and ASA.  Pt instructed not to take am dose of Met formin or Amaryl. Pt instructed to check blood sugar as soon as she gets up if less than 70 drink half glass of juice and in 15 minutes recheck blood sugar if still less than 70 call [phone] and ask to speak to a nurse.  Pt needs covid test day of surgery.

## 2021-01-24 ENCOUNTER — Inpatient Hospital Stay (HOSPITAL_COMMUNITY): Payer: Medicare HMO | Admitting: Vascular Surgery

## 2021-01-24 ENCOUNTER — Inpatient Hospital Stay (HOSPITAL_COMMUNITY)
Admission: RE | Admit: 2021-01-24 | Discharge: 2021-01-25 | DRG: 475 | Disposition: A | Discharge status: Home / Self Care | Payer: Medicare HMO | Attending: Orthopedic Surgery | Admitting: Orthopedic Surgery

## 2021-01-24 ENCOUNTER — Encounter (HOSPITAL_COMMUNITY): Payer: Self-pay | Admitting: Orthopedic Surgery

## 2021-01-24 ENCOUNTER — Encounter (HOSPITAL_COMMUNITY)
Admission: RE | Disposition: A | Discharge status: Home / Self Care | Payer: Self-pay | Source: Home / Self Care | Attending: Orthopedic Surgery

## 2021-01-24 DIAGNOSIS — Z833 Family history of diabetes mellitus: Secondary | ICD-10-CM

## 2021-01-24 DIAGNOSIS — E1122 Type 2 diabetes mellitus with diabetic chronic kidney disease: Secondary | ICD-10-CM | POA: Diagnosis not present

## 2021-01-24 DIAGNOSIS — Z8542 Personal history of malignant neoplasm of other parts of uterus: Secondary | ICD-10-CM

## 2021-01-24 DIAGNOSIS — E039 Hypothyroidism, unspecified: Secondary | ICD-10-CM | POA: Diagnosis not present

## 2021-01-24 DIAGNOSIS — I129 Hypertensive chronic kidney disease with stage 1 through stage 4 chronic kidney disease, or unspecified chronic kidney disease: Secondary | ICD-10-CM | POA: Diagnosis not present

## 2021-01-24 DIAGNOSIS — T8781 Dehiscence of amputation stump: Principal | ICD-10-CM

## 2021-01-24 DIAGNOSIS — F17210 Nicotine dependence, cigarettes, uncomplicated: Secondary | ICD-10-CM | POA: Diagnosis present

## 2021-01-24 DIAGNOSIS — E1169 Type 2 diabetes mellitus with other specified complication: Secondary | ICD-10-CM | POA: Diagnosis not present

## 2021-01-24 DIAGNOSIS — E1151 Type 2 diabetes mellitus with diabetic peripheral angiopathy without gangrene: Secondary | ICD-10-CM | POA: Diagnosis not present

## 2021-01-24 DIAGNOSIS — N189 Chronic kidney disease, unspecified: Secondary | ICD-10-CM | POA: Diagnosis not present

## 2021-01-24 DIAGNOSIS — I10 Essential (primary) hypertension: Secondary | ICD-10-CM | POA: Diagnosis present

## 2021-01-24 DIAGNOSIS — F32A Depression, unspecified: Secondary | ICD-10-CM | POA: Diagnosis not present

## 2021-01-24 DIAGNOSIS — Z800 Family history of malignant neoplasm of digestive organs: Secondary | ICD-10-CM | POA: Diagnosis not present

## 2021-01-24 DIAGNOSIS — Z90710 Acquired absence of both cervix and uterus: Secondary | ICD-10-CM

## 2021-01-24 DIAGNOSIS — K219 Gastro-esophageal reflux disease without esophagitis: Secondary | ICD-10-CM | POA: Diagnosis present

## 2021-01-24 DIAGNOSIS — E1152 Type 2 diabetes mellitus with diabetic peripheral angiopathy with gangrene: Secondary | ICD-10-CM | POA: Diagnosis not present

## 2021-01-24 DIAGNOSIS — Z86718 Personal history of other venous thrombosis and embolism: Secondary | ICD-10-CM | POA: Diagnosis not present

## 2021-01-24 DIAGNOSIS — E1142 Type 2 diabetes mellitus with diabetic polyneuropathy: Secondary | ICD-10-CM | POA: Diagnosis present

## 2021-01-24 DIAGNOSIS — J449 Chronic obstructive pulmonary disease, unspecified: Secondary | ICD-10-CM | POA: Diagnosis present

## 2021-01-24 DIAGNOSIS — M86261 Subacute osteomyelitis, right tibia and fibula: Secondary | ICD-10-CM | POA: Diagnosis not present

## 2021-01-24 DIAGNOSIS — T8130XA Disruption of wound, unspecified, initial encounter: Secondary | ICD-10-CM

## 2021-01-24 DIAGNOSIS — Z20822 Contact with and (suspected) exposure to covid-19: Secondary | ICD-10-CM | POA: Diagnosis not present

## 2021-01-24 DIAGNOSIS — Y835 Amputation of limb(s) as the cause of abnormal reaction of the patient, or of later complication, without mention of misadventure at the time of the procedure: Secondary | ICD-10-CM | POA: Diagnosis present

## 2021-01-24 DIAGNOSIS — E785 Hyperlipidemia, unspecified: Secondary | ICD-10-CM | POA: Diagnosis present

## 2021-01-24 DIAGNOSIS — L0291 Cutaneous abscess, unspecified: Secondary | ICD-10-CM

## 2021-01-24 DIAGNOSIS — M869 Osteomyelitis, unspecified: Secondary | ICD-10-CM | POA: Diagnosis not present

## 2021-01-24 HISTORY — DX: Disruption of wound, unspecified, initial encounter: T81.30XA

## 2021-01-24 HISTORY — DX: Unspecified osteoarthritis, unspecified site: M19.90

## 2021-01-24 HISTORY — PX: STUMP REVISION: SHX6102

## 2021-01-24 LAB — BASIC METABOLIC PANEL
Anion gap: 9 (ref 5–15)
BUN: 17 mg/dL (ref 6–20)
CO2: 25 mmol/L (ref 22–32)
Calcium: 8.9 mg/dL (ref 8.9–10.3)
Chloride: 103 mmol/L (ref 98–111)
Creatinine, Ser: 0.78 mg/dL (ref 0.44–1.00)
GFR, Estimated: >60 mL/min (ref >60)
Glucose, Bld: 161 mg/dL — ABNORMAL HIGH (ref 70–99)
Potassium: 4.5 mmol/L (ref 3.5–5.1)
Sodium: 137 mmol/L (ref 135–145)

## 2021-01-24 LAB — GLUCOSE, CAPILLARY
Glucose-Capillary: 196 mg/dL — ABNORMAL HIGH (ref 70–99)
Glucose-Capillary: 149 mg/dL — ABNORMAL HIGH (ref 70–99)
Glucose-Capillary: 159 mg/dL — ABNORMAL HIGH (ref 70–99)
Glucose-Capillary: 147 mg/dL — ABNORMAL HIGH (ref 70–99)
Glucose-Capillary: 135 mg/dL — ABNORMAL HIGH (ref 70–99)

## 2021-01-24 LAB — CBC
HCT: 46.2 % — ABNORMAL HIGH (ref 36.0–46.0)
Hemoglobin: 15.1 g/dL — ABNORMAL HIGH (ref 12.0–15.0)
MCH: 28.6 pg (ref 26.0–34.0)
MCHC: 32.7 g/dL (ref 30.0–36.0)
MCV: 87.5 fL (ref 80.0–100.0)
Platelets: 263 10*3/uL (ref 150–400)
RBC: 5.28 MIL/uL — ABNORMAL HIGH (ref 3.87–5.11)
RDW: 15.4 % (ref 11.5–15.5)
WBC: 15 10*3/uL — ABNORMAL HIGH (ref 4.0–10.5)
nRBC: 0 % (ref 0.0–0.2)

## 2021-01-24 LAB — HEMOGLOBIN A1C
Hgb A1c MFr Bld: 8.3 % — ABNORMAL HIGH (ref 4.8–5.6)
Mean Plasma Glucose: 191.51 mg/dL

## 2021-01-24 LAB — SARS CORONAVIRUS 2 BY RT PCR (HOSPITAL ORDER, PERFORMED IN ~~LOC~~ HOSPITAL LAB): SARS Coronavirus 2: NEGATIVE

## 2021-01-24 SURGERY — REVISION, AMPUTATION SITE
Anesthesia: General | Laterality: Right

## 2021-01-24 MED ORDER — EZETIMIBE 10 MG PO TABS
10.0000 mg | ORAL_TABLET | Freq: Every day | ORAL | Status: DC
  Administered 2021-01-24: 10 mg via ORAL
  Filled 2021-01-24 (×2): qty 1

## 2021-01-24 MED ORDER — PROPOFOL 10 MG/ML IV BOLUS
INTRAVENOUS | Status: DC | PRN
  Administered 2021-01-24: 100 mg via INTRAVENOUS
  Administered 2021-01-24: 200 mg via INTRAVENOUS

## 2021-01-24 MED ORDER — METFORMIN HCL ER 500 MG PO TB24: 2000.0000 mg | ORAL_TABLET | Freq: Every day | ORAL | Status: DC

## 2021-01-24 MED ORDER — ASCORBIC ACID 500 MG PO TABS
1000.0000 mg | ORAL_TABLET | Freq: Every day | ORAL | Status: DC
  Administered 2021-01-24: 1000 mg via ORAL
  Filled 2021-01-24 (×2): qty 2

## 2021-01-24 MED ORDER — POLYETHYLENE GLYCOL 3350 17 G PO PACK: 17.0000 g | PACK | Freq: Every day | ORAL | Status: DC | PRN

## 2021-01-24 MED ORDER — HYDROMORPHONE HCL 1 MG/ML IJ SOLN
0.5000 mg | INTRAMUSCULAR | Status: DC | PRN
  Administered 2021-01-24 (×2): 0.5 mg via INTRAVENOUS
  Filled 2021-01-24 (×3): qty 0.5

## 2021-01-24 MED ORDER — ACETAMINOPHEN 325 MG PO TABS: 325.0000 mg | ORAL_TABLET | Freq: Four times a day (QID) | ORAL | Status: DC | PRN

## 2021-01-24 MED ORDER — HYDROMORPHONE HCL 1 MG/ML IJ SOLN
0.2500 mg | INTRAMUSCULAR | Status: DC | PRN
  Administered 2021-01-24: 0.5 mg via INTRAVENOUS

## 2021-01-24 MED ORDER — ZINC SULFATE 220 (50 ZN) MG PO CAPS
220.0000 mg | ORAL_CAPSULE | Freq: Every day | ORAL | Status: DC
  Administered 2021-01-24: 220 mg via ORAL
  Filled 2021-01-24 (×2): qty 1

## 2021-01-24 MED ORDER — OXYCODONE HCL 5 MG/5ML PO SOLN
5.0000 mg | Freq: Once | ORAL | Status: DC | PRN
Start: 2021-01-24 — End: 2021-01-24

## 2021-01-24 MED ORDER — LOSARTAN POTASSIUM 50 MG PO TABS
50.0000 mg | ORAL_TABLET | Freq: Every day | ORAL | Status: DC
  Filled 2021-01-24: qty 1

## 2021-01-24 MED ORDER — CHLORHEXIDINE GLUCONATE 0.12 % MT SOLN
15.0000 mL | Freq: Once | OROMUCOSAL | Status: AC
  Administered 2021-01-24: 15 mL via OROMUCOSAL
  Filled 2021-01-24: qty 15

## 2021-01-24 MED ORDER — FENTANYL CITRATE (PF) 100 MCG/2ML IJ SOLN
INTRAMUSCULAR | Status: AC
  Filled 2021-01-24: qty 2

## 2021-01-24 MED ORDER — MIDAZOLAM HCL 5 MG/5ML IJ SOLN
INTRAMUSCULAR | Status: DC | PRN
  Administered 2021-01-24: 2 mg via INTRAVENOUS

## 2021-01-24 MED ORDER — ESCITALOPRAM OXALATE 10 MG PO TABS
10.0000 mg | ORAL_TABLET | Freq: Every day | ORAL | Status: DC
  Administered 2021-01-24: 10 mg via ORAL
  Filled 2021-01-24 (×2): qty 1

## 2021-01-24 MED ORDER — GABAPENTIN 300 MG PO CAPS
300.0000 mg | ORAL_CAPSULE | Freq: Every day | ORAL | Status: DC
  Filled 2021-01-24: qty 1

## 2021-01-24 MED ORDER — UMECLIDINIUM BROMIDE 62.5 MCG/INH IN AEPB
2.0000 | INHALATION_SPRAY | RESPIRATORY_TRACT | Status: DC
  Filled 2021-01-24: qty 7

## 2021-01-24 MED ORDER — LIDOCAINE 2% (20 MG/ML) 5 ML SYRINGE
INTRAMUSCULAR | Status: DC | PRN
  Administered 2021-01-24: 60 mg via INTRAVENOUS

## 2021-01-24 MED ORDER — ACETAMINOPHEN 500 MG PO TABS
1000.0000 mg | ORAL_TABLET | Freq: Once | ORAL | Status: AC
  Administered 2021-01-24: 1000 mg via ORAL
  Filled 2021-01-24: qty 2

## 2021-01-24 MED ORDER — SODIUM CHLORIDE 0.9 % IV SOLN: INTRAVENOUS | Status: DC

## 2021-01-24 MED ORDER — LABETALOL HCL 5 MG/ML IV SOLN: 10.0000 mg | INTRAVENOUS | Status: DC | PRN

## 2021-01-24 MED ORDER — GUAIFENESIN-DM 100-10 MG/5ML PO SYRP: 15.0000 mL | ORAL_SOLUTION | ORAL | Status: DC | PRN

## 2021-01-24 MED ORDER — FENTANYL CITRATE (PF) 250 MCG/5ML IJ SOLN
INTRAMUSCULAR | Status: AC
  Filled 2021-01-24: qty 5

## 2021-01-24 MED ORDER — JUVEN PO PACK
1.0000 | PACK | Freq: Two times a day (BID) | ORAL | Status: DC
  Administered 2021-01-25: 1 via ORAL
  Filled 2021-01-24: qty 1

## 2021-01-24 MED ORDER — OXYCODONE HCL 5 MG PO TABS
ORAL_TABLET | ORAL | Status: AC
  Filled 2021-01-24: qty 1

## 2021-01-24 MED ORDER — PROMETHAZINE HCL 25 MG/ML IJ SOLN: 6.2500 mg | INTRAMUSCULAR | Status: DC | PRN

## 2021-01-24 MED ORDER — MIDAZOLAM HCL 2 MG/2ML IJ SOLN
INTRAMUSCULAR | Status: AC
  Filled 2021-01-24: qty 2

## 2021-01-24 MED ORDER — OXYCODONE HCL 5 MG PO TABS
10.0000 mg | ORAL_TABLET | ORAL | Status: DC | PRN
  Administered 2021-01-24 (×2): 10 mg via ORAL
  Administered 2021-01-25 (×3): 15 mg via ORAL
  Filled 2021-01-24 (×3): qty 3

## 2021-01-24 MED ORDER — GLIMEPIRIDE 4 MG PO TABS
4.0000 mg | ORAL_TABLET | Freq: Every day | ORAL | Status: DC
  Filled 2021-01-24: qty 1

## 2021-01-24 MED ORDER — FENTANYL CITRATE (PF) 250 MCG/5ML IJ SOLN
INTRAMUSCULAR | Status: DC | PRN
  Administered 2021-01-24: 50 ug via INTRAVENOUS

## 2021-01-24 MED ORDER — METOPROLOL TARTRATE 5 MG/5ML IV SOLN: 2.0000 mg | INTRAVENOUS | Status: DC | PRN

## 2021-01-24 MED ORDER — ONDANSETRON HCL 4 MG/2ML IJ SOLN: 4.0000 mg | Freq: Four times a day (QID) | INTRAMUSCULAR | Status: DC | PRN

## 2021-01-24 MED ORDER — PANTOPRAZOLE SODIUM 40 MG PO TBEC
40.0000 mg | DELAYED_RELEASE_TABLET | Freq: Every day | ORAL | Status: DC
  Filled 2021-01-24: qty 1

## 2021-01-24 MED ORDER — CEFAZOLIN SODIUM-DEXTROSE 2-4 GM/100ML-% IV SOLN
2.0000 g | Freq: Three times a day (TID) | INTRAVENOUS | Status: AC
  Administered 2021-01-24 – 2021-01-25 (×2): 2 g via INTRAVENOUS
  Filled 2021-01-24 (×3): qty 100

## 2021-01-24 MED ORDER — ACETAMINOPHEN 500 MG PO TABS: 1000.0000 mg | ORAL_TABLET | Freq: Once | ORAL | Status: DC

## 2021-01-24 MED ORDER — ASPIRIN EC 81 MG PO TBEC
81.0000 mg | DELAYED_RELEASE_TABLET | Freq: Every day | ORAL | Status: DC
  Filled 2021-01-24: qty 1

## 2021-01-24 MED ORDER — OXYCODONE HCL 5 MG PO TABS
5.0000 mg | ORAL_TABLET | ORAL | Status: DC | PRN
  Filled 2021-01-24 (×2): qty 2

## 2021-01-24 MED ORDER — MAGNESIUM CITRATE PO SOLN
1.0000 | Freq: Once | ORAL | Status: DC | PRN
  Filled 2021-01-24: qty 296

## 2021-01-24 MED ORDER — FUROSEMIDE 20 MG PO TABS
20.0000 mg | ORAL_TABLET | Freq: Every day | ORAL | Status: DC
  Administered 2021-01-24: 20 mg via ORAL
  Filled 2021-01-24 (×2): qty 1

## 2021-01-24 MED ORDER — PHENOL 1.4 % MT LIQD: 1.0000 | OROMUCOSAL | Status: DC | PRN

## 2021-01-24 MED ORDER — ONDANSETRON HCL 4 MG/2ML IJ SOLN
INTRAMUSCULAR | Status: DC | PRN
  Administered 2021-01-24: 4 mg via INTRAVENOUS

## 2021-01-24 MED ORDER — ALUM & MAG HYDROXIDE-SIMETH 200-200-20 MG/5ML PO SUSP: 15.0000 mL | ORAL | Status: DC | PRN

## 2021-01-24 MED ORDER — LEVOTHYROXINE SODIUM 100 MCG PO TABS
100.0000 ug | ORAL_TABLET | Freq: Every day | ORAL | Status: DC
  Administered 2021-01-25: 100 ug via ORAL
  Filled 2021-01-24: qty 1

## 2021-01-24 MED ORDER — OXYCODONE HCL 5 MG PO TABS: 5.0000 mg | ORAL_TABLET | Freq: Once | ORAL | Status: DC | PRN

## 2021-01-24 MED ORDER — INSULIN ASPART 100 UNIT/ML IJ SOLN
0.0000 [IU] | Freq: Three times a day (TID) | INTRAMUSCULAR | Status: DC
  Administered 2021-01-24: 2 [IU] via SUBCUTANEOUS
  Administered 2021-01-25: 3 [IU] via SUBCUTANEOUS

## 2021-01-24 MED ORDER — HYDROMORPHONE HCL 1 MG/ML IJ SOLN
INTRAMUSCULAR | Status: AC
  Filled 2021-01-24: qty 1

## 2021-01-24 MED ORDER — CLOPIDOGREL BISULFATE 75 MG PO TABS
75.0000 mg | ORAL_TABLET | Freq: Every day | ORAL | Status: DC
  Filled 2021-01-24: qty 1

## 2021-01-24 MED ORDER — DOCUSATE SODIUM 100 MG PO CAPS
100.0000 mg | ORAL_CAPSULE | Freq: Every day | ORAL | Status: DC
  Filled 2021-01-24: qty 1

## 2021-01-24 MED ORDER — 0.9 % SODIUM CHLORIDE (POUR BTL) OPTIME
TOPICAL | Status: DC | PRN
  Administered 2021-01-24: 1000 mL

## 2021-01-24 MED ORDER — RIVAROXABAN 20 MG PO TABS
20.0000 mg | ORAL_TABLET | Freq: Every day | ORAL | Status: DC
Start: 2021-01-28 — End: 2021-01-25

## 2021-01-24 MED ORDER — CEFAZOLIN SODIUM-DEXTROSE 2-4 GM/100ML-% IV SOLN
2.0000 g | INTRAVENOUS | Status: AC
  Administered 2021-01-24: 2 g via INTRAVENOUS

## 2021-01-24 MED ORDER — FENTANYL CITRATE (PF) 100 MCG/2ML IJ SOLN
25.0000 ug | INTRAMUSCULAR | Status: AC | PRN
  Administered 2021-01-24 (×6): 25 ug via INTRAVENOUS

## 2021-01-24 MED ORDER — BISACODYL 5 MG PO TBEC: 5.0000 mg | DELAYED_RELEASE_TABLET | Freq: Every day | ORAL | Status: DC | PRN

## 2021-01-24 MED ORDER — LACTATED RINGERS IV SOLN: INTRAVENOUS | Status: DC

## 2021-01-24 MED ORDER — CEFAZOLIN SODIUM-DEXTROSE 2-4 GM/100ML-% IV SOLN
INTRAVENOUS | Status: AC
  Filled 2021-01-24: qty 100

## 2021-01-24 MED ORDER — ORAL CARE MOUTH RINSE: 15.0000 mL | Freq: Once | OROMUCOSAL | Status: AC

## 2021-01-24 MED ORDER — LACTATED RINGERS IV SOLN: INTRAVENOUS | Status: DC | PRN

## 2021-01-24 MED ORDER — HYDRALAZINE HCL 20 MG/ML IJ SOLN: 5.0000 mg | INTRAMUSCULAR | Status: DC | PRN

## 2021-01-24 SURGICAL SUPPLY — 35 items
BAG COUNTER SPONGE SURGICOUNT (BAG) ×2 IMPLANT
BAG SPNG CNTER NS LX DISP (BAG) ×1
BAG SURGICOUNT SPONGE COUNTING (BAG) ×1
BLADE SAW RECIP 87.9 MT (BLADE) IMPLANT
BLADE SURG 21 STRL SS (BLADE) ×3 IMPLANT
COVER SURGICAL LIGHT HANDLE (MISCELLANEOUS) ×3 IMPLANT
DRAPE DERMATAC (DRAPES) ×3 IMPLANT
DRAPE EXTREMITY T 121X128X90 (DISPOSABLE) ×3 IMPLANT
DRAPE HALF SHEET 40X57 (DRAPES) ×3 IMPLANT
DRAPE INCISE IOBAN 66X45 STRL (DRAPES) ×3 IMPLANT
DRAPE U-SHAPE 47X51 STRL (DRAPES) ×6 IMPLANT
DRESSING PREVENA PLUS CUSTOM (GAUZE/BANDAGES/DRESSINGS) ×1 IMPLANT
DRSG DERMACEA 8X12 NADH (GAUZE/BANDAGES/DRESSINGS) ×3 IMPLANT
DRSG PREVENA PLUS CUSTOM (GAUZE/BANDAGES/DRESSINGS) ×3
DURAPREP 26ML APPLICATOR (WOUND CARE) ×3 IMPLANT
ELECT REM PT RETURN 9FT ADLT (ELECTROSURGICAL) ×3
ELECTRODE REM PT RTRN 9FT ADLT (ELECTROSURGICAL) ×1 IMPLANT
GLOVE SURG ORTHO LTX SZ9 (GLOVE) ×3 IMPLANT
GLOVE SURG UNDER POLY LF SZ9 (GLOVE) ×3 IMPLANT
GOWN STRL REUS W/ TWL XL LVL3 (GOWN DISPOSABLE) ×2 IMPLANT
GOWN STRL REUS W/TWL XL LVL3 (GOWN DISPOSABLE) ×6
KIT BASIN OR (CUSTOM PROCEDURE TRAY) ×3 IMPLANT
KIT TURNOVER KIT B (KITS) ×3 IMPLANT
MANIFOLD NEPTUNE II (INSTRUMENTS) ×3 IMPLANT
NS IRRIG 1000ML POUR BTL (IV SOLUTION) ×3 IMPLANT
PACK GENERAL/GYN (CUSTOM PROCEDURE TRAY) ×3 IMPLANT
PAD ARMBOARD 7.5X6 YLW CONV (MISCELLANEOUS) ×3 IMPLANT
PREVENA RESTOR ARTHOFORM 46X30 (CANNISTER) ×3 IMPLANT
PREVENA RESTOR AXIOFORM 29X28 (GAUZE/BANDAGES/DRESSINGS) ×3 IMPLANT
SPONGE T-LAP 18X18 ~~LOC~~+RFID (SPONGE) ×6 IMPLANT
STAPLER VISISTAT 35W (STAPLE) IMPLANT
SUT ETHILON 2 0 PSLX (SUTURE) ×6 IMPLANT
SUT SILK 2 0 (SUTURE)
SUT SILK 2-0 18XBRD TIE 12 (SUTURE) IMPLANT
TOWEL GREEN STERILE (TOWEL DISPOSABLE) ×3 IMPLANT

## 2021-01-24 NOTE — Transfer of Care (Signed)
Immediate Anesthesia Transfer of Care Note  Patient: [name]  Procedure(s) Performed: right below the knee amputation revision (Right)  Patient Location: PACU  Anesthesia Type:General  Level of Consciousness: awake, oriented and patient cooperative  Airway & Oxygen Therapy: Patient Spontanous Breathing and Patient connected to nasal cannula oxygen  Post-op Assessment: Report given to RN and Post -op Vital signs reviewed and stable  Post vital signs: Reviewed and stable  Last Vitals:  Vitals Value Taken Time  BP 141/79 01/24/21 1238  Temp    Pulse 90 01/24/21 1240  Resp 25 01/24/21 1240  SpO2 100 % 01/24/21 1240  Vitals shown include unvalidated device data.  Last Pain:  Vitals:   01/24/21 0848  TempSrc: Oral         Complications: No notable events documented.

## 2021-01-24 NOTE — Progress Notes (Signed)
Orthopedic Tech Progress Note Patient Details:  [name] [birth year] [id number] Called order into [facility]  Patient ID: [name], female   DOB: 07-Jul-1960, 60 y.o.   MRN: [id number]  Chip Boer 01/24/2021, 2:51 PM

## 2021-01-24 NOTE — H&P (Signed)
[name] is an 60 y.o. female.   Chief Complaint: Right stump abscess HPI:  Patient is a 60 year old woman who is about 2 years status post right transtibial amputation.  Patient states for the last several months she has been having increasing purulent drainage swelling and pain.  She has been on several courses of oral antibiotics still with persistent drainage swelling and pain. Past Medical History:  Diagnosis Date   Arthritis    COPD (chronic obstructive pulmonary disease) ([place])    Depression    DM (diabetes mellitus) ([place])    DVT (deep venous thrombosis) ([facility])    x5   Family history of colon cancer    GERD (gastroesophageal reflux disease)    Hyperlipemia    Hypertension    Hypothyroid    Obesity    Osteomyelitis of great toe of right foot ([place]) 07/04/2018   PVD (peripheral vascular disease) ([place])    Unilateral complete BKA, right, initial encounter ([place]) 07/11/2018   Unilateral complete BKA, right, subsequent encounter ([place])    Uterine cancer ([place])    UTI (urinary tract infection)     Past Surgical History:  Procedure Laterality Date   ABDOMINAL HYSTERECTOMY     AMPUTATION Right 07/08/2018   Procedure: RIGHT BELOW KNEE AMPUTATION;  Surgeon: [name], MD;  Location: [facility];  Service: Orthopedics;  Laterality: Right;   CESAREAN SECTION     FEMORAL BYPASS     x 5   LUMBAR DISC SURGERY     L4-L5   TOE AMPUTATION     right   TONSILLECTOMY      Family History  Problem Relation Age of Onset   Colon cancer Brother 7   Hypertension Mother    Hypothyroidism Mother    Heart disease Father    Diabetes Father    Hyperlipidemia Father    Clotting disorder Brother    Diabetes Brother    Heart disease Brother    Social History:  reports that she has been smoking cigarettes. She has a 38.00 pack-year smoking history. She has never used smokeless tobacco. She reports current drug use. Frequency: 4.00 times per week. Drug: Marijuana. She reports that she  does not drink alcohol.  Allergies:  Allergies  Allergen Reactions   Aleve [Naproxen Sodium] Hives and Itching   Cortisone Swelling    Internal organs   Prednisone Swelling    Makes internal organs swell   Statins Other (See Comments)    Myalgia even with Pravastatin   Vancomycin Itching    No medications prior to admission.    No results found for this or any previous visit (from the past 48 hour(s)). No results found.  Review of Systems  All other systems reviewed and are negative.  There were no vitals taken for this visit. Physical Exam  Patient is alert, oriented, no adenopathy, well-dressed, normal affect, normal respiratory effort. Examination patient has a small amount of purulent drainage from the end of the residual limb directly over the distal tibia.  Radiograph shows a punched-out lytic lesion of the tibia most likely consistent with osteomyelitis with the purulent drainage.  Cultures have been positive for staph.  There is no ascending cellulitis.Heart RRR Lungs clear Assessment/Plan 1. Acquired absence of right leg below knee ([place])   2. Type 2 diabetes mellitus with diabetic polyneuropathy, without long-term current use of insulin ([place])   3. Peripheral vascular insufficiency ([place])   4. Subacute osteomyelitis of right tibia ([facility])  Plan: Discussed with the lytic changes to the distal tibia the best option would be to proceed with a revision of the right transtibial amputation we will try to set this up for Friday.  Anticipate overnight observation.  Pam Palmer Pam Bradstreet, PA 01/24/2021, 6:43 AM

## 2021-01-24 NOTE — Op Note (Signed)
01/24/2021  2:03 PM  PATIENT:  [name]    PRE-OPERATIVE DIAGNOSIS:  osteomyelitis right below the knee amputation  POST-OPERATIVE DIAGNOSIS:  Same  PROCEDURE:  right below the knee amputation revision  SURGEON:  [name], MD  PHYSICIAN ASSISTANT:None ANESTHESIA:   General  PREOPERATIVE INDICATIONS:  [name] is a  60 y.o. female with a diagnosis of osteomyelitis right below the knee amputation who failed conservative measures and elected for surgical management.    The risks benefits and alternatives were discussed with the patient preoperatively including but not limited to the risks of infection, bleeding, nerve injury, cardiopulmonary complications, the need for revision surgery, among others, and the patient was willing to proceed.  OPERATIVE IMPLANTS: Praveena customizable and Arthur form wound VAC  '@ENCIMAGES'$ @  OPERATIVE FINDINGS: Wound margins were clear.  OPERATIVE PROCEDURE: Patient was brought the operating room and underwent a regional anesthetic.  After adequate levels anesthesia were obtained patient's right lower extremity was prepped using DuraPrep draped into a sterile field a timeout was called.  Elliptical incision was made around the ulcerative tissue.  The reciprocating saw was then used to amputate the distal tibia approximately 2 cm proximal to the infected bone.  The anterior aspect was beveled.  The fibula was also resected 2 cm.  The bone and soft tissue margins were clear and the residual stump was amputated and fishmouth amputation revision.  The wound was irrigated normal saline electrocardio was used for hemostasis all muscle was healthy viable and good contractility.  The deep and suprafascial left fascial layers were closed using 2-0 nylon.  A Praveena customizable and Arthur form wound VAC was applied this had a good suction fit patient was then placed in a stump shrinker and a limb protector she was then taken the PACU in  stable condition   DISCHARGE PLANNING:  Antibiotic duration: 23 hours IV antibiotics  Weightbearing: Nonweightbearing on the right  Pain medication: Opioid pathway  Dressing care/ Wound VAC: Continue wound VAC for 1 week  Ambulatory devices: Walker or crutches  Discharge to: Anticipate discharge to home.  Follow-up: In the office 1 week post operative.

## 2021-01-24 NOTE — Plan of Care (Signed)
  Problem: Education: Goal: Knowledge of General Education information will improve Description: Including pain rating scale, medication(s)/side effects and non-pharmacologic comfort measures Outcome: Progressing   Problem: Health Behavior/Discharge Planning: Goal: Ability to manage health-related needs will improve Outcome: Progressing   Problem: Clinical Measurements: Goal: Ability to maintain clinical measurements within normal limits will improve Outcome: Progressing   Problem: Coping: Goal: Level of anxiety will decrease Outcome: Progressing   Problem: Elimination: Goal: Will not experience complications related to bowel motility Outcome: Progressing   Problem: Safety: Goal: Ability to remain free from injury will improve Outcome: Progressing   Problem: Skin Integrity: Goal: Risk for impaired skin integrity will decrease Outcome: Progressing   

## 2021-01-24 NOTE — Anesthesia Preprocedure Evaluation (Signed)
Anesthesia Evaluation  Patient identified by MRN, date of birth, ID band Patient awake    Reviewed: Allergy & Precautions, NPO status , Patient's Chart, lab work & pertinent test results  Airway Mallampati: II  TM Distance: >3 FB Neck ROM: Full    Dental  (+) Dental Advisory Given   Pulmonary COPD, Current Smoker,    breath sounds clear to auscultation       Cardiovascular hypertension, Pt. on medications + Peripheral Vascular Disease   Rhythm:Regular Rate:Normal     Neuro/Psych negative neurological ROS     GI/Hepatic Neg liver ROS, hiatal hernia, GERD  ,  Endo/Other  diabetes, Type 2Hypothyroidism   Renal/GU Renal disease     Musculoskeletal  (+) Arthritis ,   Abdominal   Peds  Hematology negative hematology ROS (+)   Anesthesia Other Findings   Reproductive/Obstetrics                             Lab Results  Component Value Date   WBC 10.5 07/19/2020   HGB 15.5 (H) 07/19/2020   HCT 47.7 (H) 07/19/2020   MCV 89.2 07/19/2020   PLT 267 07/19/2020   Lab Results  Component Value Date   CREATININE 0.66 07/19/2020   BUN 12 07/19/2020   NA 139 07/19/2020   K 4.1 07/19/2020   CL 105 07/19/2020   CO2 24 07/19/2020    Anesthesia Physical Anesthesia Plan  ASA: 3  Anesthesia Plan: General   Post-op Pain Management:    Induction: Intravenous  PONV Risk Score and Plan: 2 and Dexamethasone, Ondansetron and Treatment may vary due to age or medical condition  Airway Management Planned: LMA  Additional Equipment: None  Intra-op Plan:   Post-operative Plan: Extubation in OR  Informed Consent: I have reviewed the patients History and Physical, chart, labs and discussed the procedure including the risks, benefits and alternatives for the proposed anesthesia with the patient or authorized representative who has indicated his/her understanding and acceptance.     Dental  advisory given  Plan Discussed with: CRNA  Anesthesia Plan Comments:         Anesthesia Quick Evaluation

## 2021-01-24 NOTE — Interval H&P Note (Signed)
History and Physical Interval Note:  01/24/2021 11:55 AM  [name]  has presented today for surgery, with the diagnosis of osteomyelitis right below the knee amputation.  The various methods of treatment have been discussed with the patient and family. After consideration of risks, benefits and other options for treatment, the patient has consented to  Procedure(s): right below the knee amputation revision (Right) as a surgical intervention.  The patient's history has been reviewed, patient examined, no change in status, stable for surgery.  I have reviewed the patient's chart and labs.  Questions were answered to the patient's satisfaction.     [name]

## 2021-01-24 NOTE — Anesthesia Procedure Notes (Signed)
Procedure Name: LMA Insertion Date/Time: 01/24/2021 12:00 PM Performed by: Lowella Dell, CRNA Pre-anesthesia Checklist: Patient identified, Emergency Drugs available, Suction available and Patient being monitored Patient Re-evaluated:Patient Re-evaluated prior to induction Oxygen Delivery Method: Circle System Utilized Preoxygenation: Pre-oxygenation with 100% oxygen Induction Type: IV induction Ventilation: Mask ventilation without difficulty LMA: LMA inserted LMA Size: 4.0 Number of attempts: 1 Airway Equipment and Method: Bite block Placement Confirmation: positive ETCO2 Tube secured with: Tape Dental Injury: Teeth and Oropharynx as per pre-operative assessment

## 2021-01-25 LAB — GLUCOSE, CAPILLARY
Glucose-Capillary: 183 mg/dL — ABNORMAL HIGH (ref 70–99)
Glucose-Capillary: 176 mg/dL — ABNORMAL HIGH (ref 70–99)

## 2021-01-25 MED ORDER — HYDROCODONE-ACETAMINOPHEN 5-325 MG PO TABS: 1.0000 | ORAL_TABLET | ORAL | 0 refills | Status: DC | PRN

## 2021-01-25 NOTE — Evaluation (Addendum)
Physical Therapy Evaluation Patient Details Name: [name] MRN: [id number] DOB: [birth year] Today's Date: 01/25/2021   History of Present Illness  Patient is a 60 year old woman who is about 2 years status post right transtibial amputation.  Patient states for the last several months she has been having increasing purulent drainage swelling and pain.  She has been on several courses of oral antibiotics still with persistent drainage swelling and pain.  PMH includes COPD, DM, DVT, Gerd, HLD, HTN, obesity, PAD. Patient is s/p R BKA revision.   Clinical Impression  Patient received sitting up on side of bed. Transfers with mod independence. Bed mobility is independent. She is able to hop 8 feet with RW and transfers to [facility] independently. She reports her home is modified and does not have any concerns or equipment needs at this time. Patient will be able to return home upon discharge.       Follow Up Recommendations No PT follow up    Equipment Recommendations  None recommended by PT    Recommendations for Other Services       Precautions / Restrictions Precautions Precautions: Fall Precaution Comments: Wound vac Restrictions Weight Bearing Restrictions: Yes RLE Weight Bearing: Non weight bearing      Mobility  Bed Mobility Overal bed mobility: Independent                  Transfers Overall transfer level: Independent Equipment used: Rolling walker (2 wheeled)                Ambulation/Gait Ambulation/Gait assistance: Supervision Gait Distance (Feet): 8 Feet Assistive device: Rolling walker (2 wheeled) Gait Pattern/deviations: Step-to pattern Gait velocity: decr   General Gait Details: patient is able to hop 8 feet with RW, however states she will mainly use wheelchair upon discharge until she can get another prosthetic  Stairs            Wheelchair Mobility    Modified Rankin (Stroke Patients Only)       Balance Overall balance  assessment: Modified Independent;Mild deficits observed, not formally tested                                           Pertinent Vitals/Pain Pain Assessment: Faces Faces Pain Scale: No hurt    Home Living Family/patient expects to be discharged to:: Private residence Living Arrangements: Alone Available Help at Discharge: Family;Available PRN/intermittently Type of Home: Apartment Home Access: Level entry     Home Layout: One level Home Equipment: Walker - 2 wheels;Wheelchair - manual Additional Comments: patient reports her home is completely handicapped accessible.    Prior Function Level of Independence: Independent         Comments: Ambulated without AD and prosthetic. Is able to pivot independently and has wheelchair.     Hand Dominance        Extremity/Trunk Assessment   Upper Extremity Assessment Upper Extremity Assessment: Defer to OT evaluation    Lower Extremity Assessment Lower Extremity Assessment: Overall WFL for tasks assessed    Cervical / Trunk Assessment Cervical / Trunk Assessment: Normal  Communication   Communication: No difficulties  Cognition Arousal/Alertness: Awake/alert Behavior During Therapy: WFL for tasks assessed/performed Overall Cognitive Status: Within Functional Limits for tasks assessed  General Comments      Exercises     Assessment/Plan Home at discharge   PT Assessment Patent does not need any further PT services  PT Problem List Decreased strength;Decreased mobility;Decreased balance       PT Treatment Interventions Therapeutic exercise;Gait training;Stair training;Functional mobility training;Therapeutic activities;Patient/family education    PT Goals (Current goals can be found in the Care Plan section)  Acute Rehab PT Goals Patient Stated Goal: to go home today PT Goal Formulation: With patient Time For Goal Achievement:  01/28/21 Potential to Achieve Goals: Good    Frequency     Barriers to discharge Decreased caregiver support lives alone    Co-evaluation               AM-PAC PT "6 Clicks" Mobility  Outcome Measure Help needed turning from your back to your side while in a flat bed without using bedrails?: None Help needed moving from lying on your back to sitting on the side of a flat bed without using bedrails?: None Help needed moving to and from a bed to a chair (including a wheelchair)?: None Help needed standing up from a chair using your arms (e.g., wheelchair or bedside chair)?: None Help needed to walk in hospital room?: A Little Help needed climbing 3-5 steps with a railing? : A Lot 6 Click Score: 21    End of Session Equipment Utilized During Treatment: Gait belt Activity Tolerance: Patient tolerated treatment well Patient left: in bed;with call bell/phone within reach;with family/visitor present Nurse Communication: Mobility status PT Visit Diagnosis: Other abnormalities of gait and mobility (R26.89);Unsteadiness on feet (R26.81);Difficulty in walking, not elsewhere classified (R26.2)    Time: [id number] PT Time Calculation (min) (ACUTE ONLY): 9 min   Charges:   PT Evaluation $PT Eval Moderate Complexity: 1 Mod          Elonzo Sopp, PT, GCS 01/25/21,10:11 AM

## 2021-01-25 NOTE — Plan of Care (Signed)

## 2021-01-25 NOTE — Discharge Summary (Signed)
Discharge Diagnoses:  Active Problems:   Wound dehiscence   Dehiscence of amputation stump (HCC)   Surgeries: Procedure(s): right below the knee amputation revision on 01/24/2021    Consultants:   Discharged Condition: Improved  Hospital Course: [name] is an 60 y.o. female who was admitted 01/24/2021 with a chief complaint of osteomyelitis and wound dehiscence transtibial amputation, with a final diagnosis of osteomyelitis right below the knee amputation.  Patient was brought to the operating room on 01/24/2021 and underwent Procedure(s): right below the knee amputation revision.    Patient was given perioperative antibiotics:  Anti-infectives (From admission, onward)    Start     Dose/Rate Route Frequency Ordered Stop   01/24/21 2000  ceFAZolin (ANCEF) IVPB 2g/100 mL premix        2 g 200 mL/hr over 30 Minutes Intravenous Every 8 hours 01/24/21 1534 01/25/21 0528   01/24/21 0915  ceFAZolin (ANCEF) IVPB 2g/100 mL premix        2 g 200 mL/hr over 30 Minutes Intravenous On call to O.R. 01/24/21 0910 01/24/21 1202   01/24/21 0904  ceFAZolin (ANCEF) 2-4 GM/100ML-% IVPB       Note to Pharmacy: Alba Cory   : cabinet override      01/24/21 0904 01/24/21 1209     .  Patient was given sequential compression devices, early ambulation, and aspirin for DVT prophylaxis.  Recent vital signs: Patient Vitals for the past 24 hrs:  BP Temp Temp src Pulse Resp SpO2  01/25/21 0754 (!) 148/82 98.3 F (36.8 C) Oral 77 15 95 %  01/25/21 0457 130/72 98 F (36.7 C) Oral 79 15 95 %  01/24/21 2100 (!) 150/65 98 F (36.7 C) Oral 62 15 99 %  01/24/21 1544 137/71 98.3 F (36.8 C) Oral 66 18 99 %  01/24/21 1509 139/72 98.3 F (36.8 C) -- 61 13 94 %  01/24/21 1439 122/72 -- -- (!) 51 10 94 %  01/24/21 1409 126/81 -- -- (!) 58 13 96 %  01/24/21 1339 (!) 146/73 (!) 97.3 F (36.3 C) -- 65 15 93 %  01/24/21 1324 (!) 165/48 -- -- 72 15 96 %  01/24/21 1309 (!) 160/71 -- -- 60 10 97 %   01/24/21 1254 (!) 156/88 -- -- 67 13 92 %  01/24/21 1237 (!) 141/79 (!) 97.3 F (36.3 C) -- 79 18 100 %  .  Recent laboratory studies: No results found.  Discharge Medications:   Allergies as of 01/25/2021       Reactions   Aleve [naproxen Sodium] Hives, Itching   Cortisone Swelling   Internal organs   Prednisone Swelling   Makes internal organs swell   Statins Other (See Comments)   Myalgia even with Pravastatin   Vancomycin Itching        Medication List     STOP taking these medications    cephALEXin 500 MG capsule Commonly known as: KEFLEX   doxycycline 100 MG tablet Commonly known as: VIBRA-TABS       TAKE these medications    Accu-Chek Softclix Lancets lancets Use as instructed to test BGs 1-2 times daily E11.52   ascorbic acid 500 MG tablet Commonly known as: VITAMIN C Take 500 mg by mouth daily.   aspirin 81 MG EC tablet Take 1 tablet (81 mg total) by mouth daily.   clopidogrel 75 MG tablet Commonly known as: PLAVIX Take 1 tablet (75 mg total) by mouth daily.   diclofenac Sodium 1 %  Gel Commonly known as: Voltaren Apply 2 g topically 4 (four) times daily. Prn pain What changed:  when to take this reasons to take this additional instructions   escitalopram 10 MG tablet Commonly known as: LEXAPRO TAKE 1 TABLET DAILY   ezetimibe 10 MG tablet Commonly known as: Zetia Take 1 tablet (10 mg total) by mouth daily.   furosemide 20 MG tablet Commonly known as: LASIX TAKE 1 TABLET DAILY   gabapentin 300 MG capsule Commonly known as: NEURONTIN TAKE 1 CAPSULE AT BEDTIME What changed:  when to take this reasons to take this   glimepiride 4 MG tablet Commonly known as: AMARYL TAKE (1) TABLET DAILY WITH BREAKFAST.   glucose blood test strip accucheck E11.52; Test BGs 1-2 times daily as directed   HYDROcodone-acetaminophen 5-325 MG tablet Commonly known as: NORCO/VICODIN Take 1 tablet by mouth every 4 (four) hours as needed.    levothyroxine 100 MCG tablet Commonly known as: SYNTHROID Take 1 tablet (100 mcg total) by mouth daily before breakfast.   losartan 50 MG tablet Commonly known as: COZAAR TAKE 1 TABLET DAILY   metFORMIN 500 MG 24 hr tablet Commonly known as: Glucophage XR Take 2 tablets (1,000 mg total) by mouth in the morning and at bedtime. What changed:  how much to take when to take this   pantoprazole 40 MG tablet Commonly known as: PROTONIX TAKE 1 TABLET DAILY   Potassium 99 MG Tabs Take 99 mg by mouth in the morning.   rivaroxaban 20 MG Tabs tablet Commonly known as: XARELTO Take 1 tablet (20 mg total) by mouth daily with supper. What changed: when to take this   sennosides-docusate sodium 8.6-50 MG tablet Commonly known as: SENOKOT-S Take 1 tablet by mouth in the morning.   Spiriva Respimat 2.5 MCG/ACT Aers Generic drug: Tiotropium Bromide Monohydrate Inhale 2 puffs into the lungs daily. What changed: when to take this   tretinoin 0.05 % cream Commonly known as: Retin-A Apply topically at bedtime. What changed:  how much to take when to take this   triamcinolone cream 0.1 % Commonly known as: KENALOG Apply topically 2 (two) times daily for up to 10 days per flare What changed:  how much to take how to take this when to take this reasons to take this additional instructions   Vitamin D3 125 MCG (5000 UT) Tabs Take 5,000 Units by mouth daily.        Diagnostic Studies: XR Tibia/Fibula Right  Result Date: 01/13/2021 Status post amputation she has 1 lucency in the bone though difficult to know whether this was from previous x-rays.  Which did not have a true lateral.  No abscess noted   Patient benefited maximally from their hospital stay and there were no complications.     Disposition: Discharge disposition: 01-Home or Self Care     Discharge Instructions     Call MD / Call 911   Complete by: As directed    If you experience chest pain or shortness of  breath, CALL 911 and be transported to the hospital emergency room.  If you develope a fever above 101 F, pus (white drainage) or increased drainage or redness at the wound, or calf pain, call your surgeon's office.   Constipation Prevention   Complete by: As directed    Drink plenty of fluids.  Prune juice may be helpful.  You may use a stool softener, such as Colace (over the counter) 100 mg twice a day.  Use MiraLax (over  the counter) for constipation as needed.   Diet - low sodium heart healthy   Complete by: As directed    Increase activity slowly as tolerated   Complete by: As directed    Negative Pressure Wound Therapy - Incisional   Complete by: As directed    Show patient how to attach vac. Should call office if alarms   Post-operative opioid taper instructions:   Complete by: As directed    POST-OPERATIVE OPIOID TAPER INSTRUCTIONS: It is important to wean off of your opioid medication as soon as possible. If you do not need pain medication after your surgery it is ok to stop day one. Opioids include: Codeine, Hydrocodone(Norco, Vicodin), Oxycodone(Percocet, oxycontin) and hydromorphone amongst others.  Long term and even short term use of opiods can cause: Increased pain response Dependence Constipation Depression Respiratory depression And more.  Withdrawal symptoms can include Flu like symptoms Nausea, vomiting And more Techniques to manage these symptoms Hydrate well Eat regular healthy meals Stay active Use relaxation techniques(deep breathing, meditating, yoga) Do Not substitute Alcohol to help with tapering If you have been on opioids for less than two weeks and do not have pain than it is ok to stop all together.  Plan to wean off of opioids This plan should start within one week post op of your joint replacement. Maintain the same interval or time between taking each dose and first decrease the dose.  Cut the total daily intake of opioids by one tablet each  day Next start to increase the time between doses. The last dose that should be eliminated is the evening dose.          Follow-up Information     Persons, Bevely Palmer, [place] Follow up in 1 week(s).   Specialty: Orthopedic Surgery Contact information: [address] [place] [address] [phone]                  Signed: [name] 01/25/2021, 11:39 AM

## 2021-01-25 NOTE — Progress Notes (Signed)
Provided discharge education/instructions, all questions and concerns addressed, Pt not in acute distress. Pt refused to take home limb protector stating she has one at home that is available for her to use. No error noted on prevena wound vac. Discharged home with belongings accompanied by daughter.

## 2021-01-25 NOTE — Evaluation (Signed)
Occupational Therapy Evaluation Patient Details Name: [name] MRN: [id number] DOB: Oct 22, 1960 Today's Date: 01/25/2021    History of Present Illness Patient is a 60 year old woman who is about 2 years status post right transtibial amputation.  Patient states for the last several months she has been having increasing purulent drainage swelling and pain.  She has been on several courses of oral antibiotics still with persistent drainage swelling and pain.  PMH includes COPD, DM, DVT, Gerd, HLD, HTN, obesity, PAD. Patient is s/p R BKA revision.   Clinical Impression   Pt admitted for concerns/procedure listed above. PTA pt was independent with all ADL's and IADL's, including cooking, cleaning, and driving. At this time, pt continues to use compensatory strategies and demonstrates independence with all ADL's. No concerns with balance, safe with all transfers, educated on wound care and exercises for RLE. Pt has no OT needs at this time and acute OT will sign off.     Follow Up Recommendations  No OT follow up    Equipment Recommendations  None recommended by OT    Recommendations for Other Services       Precautions / Restrictions Precautions Precautions: Fall Precaution Comments: Wound vac Restrictions Weight Bearing Restrictions: Yes RLE Weight Bearing: Non weight bearing      Mobility Bed Mobility Overal bed mobility: Independent                  Transfers Overall transfer level: Independent Equipment used: Rolling walker (2 wheeled)                  Balance Overall balance assessment: Modified Independent;Mild deficits observed, not formally tested                                         ADL either performed or assessed with clinical judgement   ADL Overall ADL's : Independent;Modified independent;At baseline                                       General ADL Comments: Pt able to complete dressing, bathing,  toileting, and transfers with compensatory strategies. Safe and independent with all ADL's     Vision Baseline Vision/History: Wears glasses Wears Glasses: Reading only Patient Visual Report: No change from baseline Vision Assessment?: No apparent visual deficits     Perception Perception Perception Tested?: No   Praxis Praxis Praxis tested?: Not tested    Pertinent Vitals/Pain Pain Assessment: Faces Faces Pain Scale: No hurt     Hand Dominance     Extremity/Trunk Assessment Upper Extremity Assessment Upper Extremity Assessment: Overall WFL for tasks assessed   Lower Extremity Assessment Lower Extremity Assessment: Defer to PT evaluation   Cervical / Trunk Assessment Cervical / Trunk Assessment: Normal   Communication Communication Communication: No difficulties   Cognition Arousal/Alertness: Awake/alert Behavior During Therapy: WFL for tasks assessed/performed Overall Cognitive Status: Within Functional Limits for tasks assessed                                     General Comments  VSS, Wound vac in place    Exercises     Shoulder Instructions      Home Living Family/patient expects to be discharged to:: Private  residence Living Arrangements: Alone Available Help at Discharge: Family;Available PRN/intermittently Type of Home: Apartment Home Access: Level entry     Home Layout: One level     Bathroom Shower/Tub: Occupational psychologist: Standard Bathroom Accessibility: Yes How Accessible: Accessible via wheelchair Home Equipment: [place] - 2 wheels;Wheelchair - Insurance claims handler - standard;Grab bars - toilet;Grab bars - tub/shower;Bedside commode   Additional Comments: patient reports her home is completely handicapped accessible.      Prior Functioning/Environment Level of Independence: Independent        Comments: Ambulated without AD and prosthetic. Is able to pivot independently and has wheelchair.         OT Problem List: Decreased strength;Decreased activity tolerance;Pain      OT Treatment/Interventions:      OT Goals(Current goals can be found in the care plan section) Acute Rehab OT Goals Patient Stated Goal: to go home today OT Goal Formulation: All assessment and education complete, DC therapy Time For Goal Achievement: 01/25/21 Potential to Achieve Goals: Good  OT Frequency:     Barriers to D/C:            Co-evaluation              AM-PAC OT "6 Clicks" Daily Activity     Outcome Measure Help from another person eating meals?: None Help from another person taking care of personal grooming?: None Help from another person toileting, which includes using toliet, bedpan, or urinal?: None Help from another person bathing (including washing, rinsing, drying)?: None Help from another person to put on and taking off regular upper body clothing?: None Help from another person to put on and taking off regular lower body clothing?: None 6 Click Score: 24   End of Session Equipment Utilized During Treatment: Rolling walker Nurse Communication: Mobility status  Activity Tolerance: Patient tolerated treatment well Patient left: in bed;with call bell/phone within reach;with family/visitor present  OT Visit Diagnosis: Unsteadiness on feet (R26.81);Other abnormalities of gait and mobility (R26.89);Muscle weakness (generalized) (M62.81)                Time: 1027-1040 OT Time Calculation (min): 13 min Charges:  OT General Charges $OT Visit: 1 Visit OT Evaluation $OT Eval Low Complexity: Let[place]., OTR/L Acute Rehabilitation  [name] [name] 01/25/2021, 10:46 AM

## 2021-01-25 NOTE — Progress Notes (Signed)
Switched to [employer] wound vac, dressing cleam/dry/intact, no error noted.

## 2021-01-27 ENCOUNTER — Encounter (HOSPITAL_COMMUNITY): Payer: Self-pay | Admitting: Orthopedic Surgery

## 2021-01-27 NOTE — Anesthesia Postprocedure Evaluation (Signed)
Anesthesia Post Note  Patient: [name]  Procedure(s) Performed: right below the knee amputation revision (Right)     Patient location during evaluation: PACU Anesthesia Type: General Level of consciousness: awake and alert Pain management: pain level controlled Vital Signs Assessment: post-procedure vital signs reviewed and stable Respiratory status: spontaneous breathing, nonlabored ventilation, respiratory function stable and patient connected to nasal cannula oxygen Cardiovascular status: blood pressure returned to baseline and stable Postop Assessment: no apparent nausea or vomiting Anesthetic complications: no   No notable events documented.  Last Vitals:  Vitals:   01/25/21 0457 01/25/21 0754  BP: 130/72 (!) 148/82  Pulse: 79 77  Resp: 15 15  Temp: 36.7 C 36.8 C  SpO2: 95% 95%    Last Pain:  Vitals:   01/25/21 1021  TempSrc:   PainSc: 0-No pain                 Tiajuana Amass

## 2021-01-31 ENCOUNTER — Encounter: Payer: Self-pay | Admitting: Family

## 2021-01-31 ENCOUNTER — Ambulatory Visit (INDEPENDENT_AMBULATORY_CARE_PROVIDER_SITE_OTHER): Payer: Medicare HMO | Admitting: Family

## 2021-01-31 DIAGNOSIS — Z89511 Acquired absence of right leg below knee: Secondary | ICD-10-CM

## 2021-01-31 NOTE — Progress Notes (Signed)
Post-Op Visit Note   Patient: [name]           Date of Birth: 1960-12-27           MRN: [id number] Visit Date: 01/31/2021 PCP: Janora Norlander, DO  Chief Complaint: No chief complaint on file.   HPI:  HPI The patient is a 60 year old woman seen status post revision right below-knee amputation.  Wound VAC removed today.  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  Ortho Exam On examination of the right residual limb incision well approximated sutures there is no gaping no drainage no erythema no sign of infection  Visit Diagnoses: No diagnosis found.  Plan: Begin daily Dial soap cleansing.  Dry dressing changes.  Shrinker around-the-clock.  Follow-up in 2 weeks for suture removal.  Follow-Up Instructions: No follow-ups on file.   Imaging: No results found.  Orders:  No orders of the defined types were placed in this encounter.  No orders of the defined types were placed in this encounter.    PMFS History: Patient Active Problem List   Diagnosis Date Noted   Wound dehiscence 01/24/2021   Dehiscence of amputation stump ([place])    Abscess 01/07/2021   History of DVT (deep vein thrombosis) 07/03/2019   Statin intolerance 04/19/2019   Vitamin D deficiency 12/27/2018   Type 2 diabetes mellitus with diabetic peripheral angiopathy and gangrene, without long-term current use of insulin ([place]) 12/27/2018   DVT of axillary vein, chronic, bilateral ([place]) 12/27/2018   Hypertension associated with chronic kidney disease due to type 2 diabetes mellitus ([place]) 12/27/2018   Long term current use of anticoagulant 12/27/2018   Medically noncompliant 12/27/2018   S/P unilateral BKA (below knee amputation), right (HCC)    Subtherapeutic international normalized ratio (INR)    Noncompliance    Leukocytosis    Acute blood loss anemia    Diabetes mellitus type 2 in obese [facility])    Acquired absence of right leg below knee ([place]) 07/08/2018   Mild protein-calorie  malnutrition (HCC)    Intermittent claudication ([place]) 05/11/2017   Deep vein thrombosis (DVT) of both lower extremities ([place]) 01/05/2017   Peripheral vascular insufficiency ([place]) 09/25/2015   Hypothyroidism 01/29/2014   Obesity (BMI 30-39.9) 08/16/2013   Impetigo 07/28/2013   Chronic obstructive pulmonary disease ([place])    Malignant neoplasm of uterus ([place])    GERD (gastroesophageal reflux disease) 10/18/2012   Hernia, hiatal 10/18/2012   Hyperlipidemia associated with type 2 diabetes mellitus ([place]) 04/18/2008   SMOKER 04/18/2008   Past Medical History:  Diagnosis Date   Arthritis    COPD (chronic obstructive pulmonary disease) (HCC)    Depression    DM (diabetes mellitus) (HCC)    DVT (deep venous thrombosis) (HCC)    x5   Family history of colon cancer    GERD (gastroesophageal reflux disease)    Hyperlipemia    Hypertension    Hypothyroid    Obesity    Osteomyelitis of great toe of right foot ([place]) 07/04/2018   PVD (peripheral vascular disease) ([place])    Unilateral complete BKA, right, initial encounter ([place]) 07/11/2018   Unilateral complete BKA, right, subsequent encounter ([place])    Uterine cancer ([place])    UTI (urinary tract infection)     Family History  Problem Relation Age of Onset   Colon cancer Brother 45   Hypertension Mother    Hypothyroidism Mother    Heart disease Father    Diabetes Father    Hyperlipidemia  Father    Clotting disorder Brother    Diabetes Brother    Heart disease Brother     Past Surgical History:  Procedure Laterality Date   ABDOMINAL HYSTERECTOMY     AMPUTATION Right 07/08/2018   Procedure: RIGHT BELOW KNEE AMPUTATION;  Surgeon: [name], MD;  Location: [facility];  Service: Orthopedics;  Laterality: Right;   CESAREAN SECTION     FEMORAL BYPASS     x 5   LUMBAR DISC SURGERY     L4-L5   STUMP REVISION Right 01/24/2021   Procedure: right below the knee amputation revision;  Surgeon: [name], MD;  Location: [facility];  Service:  Orthopedics;  Laterality: Right;   TOE AMPUTATION     right   TONSILLECTOMY     Social History   Occupational History   Occupation: Product manager: Art gallery manager   Occupation: retired  Tobacco Use   Smoking status: Every Day    Packs/day: 1.00    Years: 38.00    Pack years: 38.00    Types: Cigarettes   Smokeless tobacco: Never  Vaping Use   Vaping Use: Never used  Substance and Sexual Activity   Alcohol use: No    Alcohol/week: 0.0 standard drinks   Drug use: Yes    Frequency: 4.0 times per week    Types: Marijuana   Sexual activity: Not Currently    Birth control/protection: Surgical

## 2021-02-05 ENCOUNTER — Inpatient Hospital Stay: Admission: RE | Admit: 2021-02-05 | Payer: Medicare HMO | Source: Ambulatory Visit

## 2021-02-14 ENCOUNTER — Encounter: Payer: Self-pay | Admitting: Physician Assistant

## 2021-02-14 ENCOUNTER — Ambulatory Visit (INDEPENDENT_AMBULATORY_CARE_PROVIDER_SITE_OTHER): Payer: Medicare HMO | Admitting: Physician Assistant

## 2021-02-14 DIAGNOSIS — Z89511 Acquired absence of right leg below knee: Secondary | ICD-10-CM

## 2021-02-14 NOTE — Progress Notes (Signed)
Office Visit Note   Patient: [name]           Date of Birth: Aug 11, 1960           MRN: [id number] Visit Date: 02/14/2021              Requested by: Janora Norlander, DO [place],  [place] [id number] PCP: Janora Norlander, DO  Chief Complaint  Patient presents with   Right Leg - Routine Post Op    01/24/21 right BKA revision       HPI: Patient is a pleasant 60 year old woman who is 3 weeks status post right below-knee amputation revision.  She is doing well.  Assessment & Plan: Visit Diagnoses: No diagnosis found.  Plan: Patient declined to have her sutures removed today.  She has agreed to have them removed in 1 week.  She is going to obtain smaller shrinkers today.  Instructed her to wear these against the skin follow-up in 1 week at which time we will remove the sutures  Follow-Up Instructions: No follow-ups on file.   Ortho Exam  Patient is alert, oriented, no adenopathy, well-dressed, normal affect, normal respiratory effort. Examination well apposed wound edges swelling is well controlled.  Sutures are in place none of them are abrading the skin at this time.  On the lateral side of the wound she does have some bloody drainage but no gaping or wound dehiscence no ascending cellulitis no signs of infection  Imaging: No results found. No images are attached to the encounter.  Labs: Lab Results  Component Value Date   HGBA1C 8.3 (H) 01/24/2021   HGBA1C 7.5 (H) 12/31/2020   HGBA1C 6.7 10/01/2020   ESRSEDRATE 19 10/25/2012   CRP 0.6 10/25/2012   LABURIC 5.7 12/10/2006   REPTSTATUS 07/09/2018 FINAL 07/04/2018   CULT NO GROWTH 5 DAYS 07/04/2018     Lab Results  Component Value Date   ALBUMIN 3.6 07/19/2020   ALBUMIN 3.7 (L) 06/26/2020   ALBUMIN 3.9 12/27/2019    Lab Results  Component Value Date   MG 1.7 07/08/2018   Lab Results  Component Value Date   VD25OH 67.6 03/29/2019   VD25OH 72.2 12/27/2018   VD25OH 56.8  11/09/2018    No results found for: PREALBUMIN CBC EXTENDED Latest Ref Rng & Units 01/24/2021 07/19/2020 06/26/2020  WBC 4.0 - 10.5 K/uL 15.0(H) 10.5 11.5(H)  RBC 3.87 - 5.11 MIL/uL 5.28(H) 5.35(H) 5.08  HGB 12.0 - 15.0 g/dL 15.1(H) 15.5(H) 14.7  HCT 36.0 - 46.0 % 46.2(H) 47.7(H) 44.0  PLT 150 - 400 K/uL 263 267 273  NEUTROABS 1.7 - 7.7 K/uL - 5.3 -  LYMPHSABS 0.7 - 4.0 K/uL - 3.9 -     There is no height or weight on file to calculate BMI.  Orders:  No orders of the defined types were placed in this encounter.  No orders of the defined types were placed in this encounter.    Procedures: No procedures performed  Clinical Data: No additional findings.  ROS:  All other systems negative, except as noted in the HPI. Review of Systems  Objective: Vital Signs: There were no vitals taken for this visit.  Specialty Comments:  No specialty comments available.  PMFS History: Patient Active Problem List   Diagnosis Date Noted   Wound dehiscence 01/24/2021   Dehiscence of amputation stump ([place])    Abscess 01/07/2021   History of DVT (deep vein thrombosis) 07/03/2019   Statin intolerance 04/19/2019  Vitamin D deficiency 12/27/2018   Type 2 diabetes mellitus with diabetic peripheral angiopathy and gangrene, without long-term current use of insulin ([place]) 12/27/2018   DVT of axillary vein, chronic, bilateral ([place]) 12/27/2018   Hypertension associated with chronic kidney disease due to type 2 diabetes mellitus ([place]) 12/27/2018   Long term current use of anticoagulant 12/27/2018   Medically noncompliant 12/27/2018   S/P unilateral BKA (below knee amputation), right (HCC)    Subtherapeutic international normalized ratio (INR)    Noncompliance    Leukocytosis    Acute blood loss anemia    Diabetes mellitus type 2 in obese [facility])    Acquired absence of right leg below knee ([place]) 07/08/2018   Mild protein-calorie malnutrition (HCC)    Intermittent claudication ([place]) 05/11/2017    Deep vein thrombosis (DVT) of both lower extremities ([place]) 01/05/2017   Peripheral vascular insufficiency ([place]) 09/25/2015   Hypothyroidism 01/29/2014   Obesity (BMI 30-39.9) 08/16/2013   Impetigo 07/28/2013   Chronic obstructive pulmonary disease ([place])    Malignant neoplasm of uterus ([place])    GERD (gastroesophageal reflux disease) 10/18/2012   Hernia, hiatal 10/18/2012   Hyperlipidemia associated with type 2 diabetes mellitus ([place]) 04/18/2008   SMOKER 04/18/2008   Past Medical History:  Diagnosis Date   Arthritis    COPD (chronic obstructive pulmonary disease) (HCC)    Depression    DM (diabetes mellitus) (HCC)    DVT (deep venous thrombosis) (HCC)    x5   Family history of colon cancer    GERD (gastroesophageal reflux disease)    Hyperlipemia    Hypertension    Hypothyroid    Obesity    Osteomyelitis of great toe of right foot ([place]) 07/04/2018   PVD (peripheral vascular disease) ([place])    Unilateral complete BKA, right, initial encounter ([place]) 07/11/2018   Unilateral complete BKA, right, subsequent encounter ([place])    Uterine cancer ([place])    UTI (urinary tract infection)     Family History  Problem Relation Age of Onset   Colon cancer Brother 73   Hypertension Mother    Hypothyroidism Mother    Heart disease Father    Diabetes Father    Hyperlipidemia Father    Clotting disorder Brother    Diabetes Brother    Heart disease Brother     Past Surgical History:  Procedure Laterality Date   ABDOMINAL HYSTERECTOMY     AMPUTATION Right 07/08/2018   Procedure: RIGHT BELOW KNEE AMPUTATION;  Surgeon: [name], MD;  Location: [place];  Service: Orthopedics;  Laterality: Right;   CESAREAN SECTION     FEMORAL BYPASS     x 5   LUMBAR DISC SURGERY     L4-L5   STUMP REVISION Right 01/24/2021   Procedure: right below the knee amputation revision;  Surgeon: [name], MD;  Location: [place];  Service: Orthopedics;  Laterality: Right;   TOE AMPUTATION     right    TONSILLECTOMY     Social History   Occupational History   Occupation: Product manager: Art gallery manager   Occupation: retired  Tobacco Use   Smoking status: Every Day    Packs/day: 1.00    Years: 38.00    Pack years: 38.00    Types: Cigarettes   Smokeless tobacco: Never  Vaping Use   Vaping Use: Never used  Substance and Sexual Activity   Alcohol use: No    Alcohol/week: 0.0 standard drinks   Drug use: Yes  Frequency: 4.0 times per week    Types: Marijuana   Sexual activity: Not Currently    Birth control/protection: Surgical

## 2021-02-21 ENCOUNTER — Other Ambulatory Visit: Payer: Self-pay | Admitting: Physician Assistant

## 2021-02-21 ENCOUNTER — Ambulatory Visit (INDEPENDENT_AMBULATORY_CARE_PROVIDER_SITE_OTHER): Payer: Medicare HMO | Admitting: Physician Assistant

## 2021-02-21 ENCOUNTER — Other Ambulatory Visit: Payer: Self-pay

## 2021-02-21 ENCOUNTER — Encounter: Payer: Self-pay | Admitting: Physician Assistant

## 2021-02-21 DIAGNOSIS — T8130XA Disruption of wound, unspecified, initial encounter: Secondary | ICD-10-CM

## 2021-02-21 NOTE — Progress Notes (Unsigned)
Office Visit Note   Patient: [name]           Date of Birth: Sep 20, 1960           MRN: [id number] Visit Date: 02/21/2021              Requested by: No referring provider defined for this encounter. PCP: Janora Norlander, DO  No chief complaint on file.     HPI: Patient is 4 weeks status post right below-knee amputation she is doing well has an appointment going to make with [facility] for her prosthetic.  Assessment & Plan: Visit Diagnoses: No diagnosis found.  Plan: Sutures were removed without difficulty follow-up in 2 weeks  Follow-Up Instructions: No follow-ups on file.   Ortho Exam  Patient is alert, oriented, no adenopathy, well-dressed, normal affect, normal respiratory effort. Well apposed wound edges she has a slight superficial dehiscence on the last half centimeter of the lateral side of her wound.  Sutures there are not holding tissue together it does not probe deeply has mild erythema but no ascending cellulitis swelling is well controlled sutures were removed without difficulty  Imaging: No results found. No images are attached to the encounter.  Labs: Lab Results  Component Value Date   HGBA1C 8.3 (H) 01/24/2021   HGBA1C 7.5 (H) 12/31/2020   HGBA1C 6.7 10/01/2020   ESRSEDRATE 19 10/25/2012   CRP 0.6 10/25/2012   LABURIC 5.7 12/10/2006   REPTSTATUS 07/09/2018 FINAL 07/04/2018   CULT NO GROWTH 5 DAYS 07/04/2018     Lab Results  Component Value Date   ALBUMIN 3.6 07/19/2020   ALBUMIN 3.7 (L) 06/26/2020   ALBUMIN 3.9 12/27/2019    Lab Results  Component Value Date   MG 1.7 07/08/2018   Lab Results  Component Value Date   VD25OH 67.6 03/29/2019   VD25OH 72.2 12/27/2018   VD25OH 56.8 11/09/2018    No results found for: PREALBUMIN CBC EXTENDED Latest Ref Rng & Units 01/24/2021 07/19/2020 06/26/2020  WBC 4.0 - 10.5 K/uL 15.0(H) 10.5 11.5(H)  RBC 3.87 - 5.11 MIL/uL 5.28(H) 5.35(H) 5.08  HGB 12.0 - 15.0 g/dL 15.1(H) 15.5(H) 14.7   HCT 36.0 - 46.0 % 46.2(H) 47.7(H) 44.0  PLT 150 - 400 K/uL 263 267 273  NEUTROABS 1.7 - 7.7 K/uL - 5.3 -  LYMPHSABS 0.7 - 4.0 K/uL - 3.9 -     There is no height or weight on file to calculate BMI.  Orders:  No orders of the defined types were placed in this encounter.  No orders of the defined types were placed in this encounter.    Procedures: No procedures performed  Clinical Data: No additional findings.  ROS:  All other systems negative, except as noted in the HPI. Review of Systems  Objective: Vital Signs: There were no vitals taken for this visit.  Specialty Comments:  No specialty comments available.  PMFS History: Patient Active Problem List   Diagnosis Date Noted   Wound dehiscence 01/24/2021   Dehiscence of amputation stump ([place])    Abscess 01/07/2021   History of DVT (deep vein thrombosis) 07/03/2019   Statin intolerance 04/19/2019   Vitamin D deficiency 12/27/2018   Type 2 diabetes mellitus with diabetic peripheral angiopathy and gangrene, without long-term current use of insulin ([place]) 12/27/2018   DVT of axillary vein, chronic, bilateral ([place]) 12/27/2018   Hypertension associated with chronic kidney disease due to type 2 diabetes mellitus ([place]) 12/27/2018   Long term current use of anticoagulant  12/27/2018   Medically noncompliant 12/27/2018   S/P unilateral BKA (below knee amputation), right (HCC)    Subtherapeutic international normalized ratio (INR)    Noncompliance    Leukocytosis    Acute blood loss anemia    Diabetes mellitus type 2 in obese [facility])    Acquired absence of right leg below knee ([place]) 07/08/2018   Mild protein-calorie malnutrition (HCC)    Intermittent claudication ([place]) 05/11/2017   Deep vein thrombosis (DVT) of both lower extremities ([place]) 01/05/2017   Peripheral vascular insufficiency ([place]) 09/25/2015   Hypothyroidism 01/29/2014   Obesity (BMI 30-39.9) 08/16/2013   Impetigo 07/28/2013   Chronic obstructive pulmonary  disease ([place])    Malignant neoplasm of uterus ([place])    GERD (gastroesophageal reflux disease) 10/18/2012   Hernia, hiatal 10/18/2012   Hyperlipidemia associated with type 2 diabetes mellitus ([place]) 04/18/2008   SMOKER 04/18/2008   Past Medical History:  Diagnosis Date   Arthritis    COPD (chronic obstructive pulmonary disease) (HCC)    Depression    DM (diabetes mellitus) (HCC)    DVT (deep venous thrombosis) (HCC)    x5   Family history of colon cancer    GERD (gastroesophageal reflux disease)    Hyperlipemia    Hypertension    Hypothyroid    Obesity    Osteomyelitis of great toe of right foot ([place]) 07/04/2018   PVD (peripheral vascular disease) (HCC)    Unilateral complete BKA, right, initial encounter ([place]) 07/11/2018   Unilateral complete BKA, right, subsequent encounter ([place])    Uterine cancer ([place])    UTI (urinary tract infection)     Family History  Problem Relation Age of Onset   Colon cancer Brother 55   Hypertension Mother    Hypothyroidism Mother    Heart disease Father    Diabetes Father    Hyperlipidemia Father    Clotting disorder Brother    Diabetes Brother    Heart disease Brother     Past Surgical History:  Procedure Laterality Date   ABDOMINAL HYSTERECTOMY     AMPUTATION Right 07/08/2018   Procedure: RIGHT BELOW KNEE AMPUTATION;  Surgeon: [name], MD;  Location: [place];  Service: Orthopedics;  Laterality: Right;   CESAREAN SECTION     FEMORAL BYPASS     x 5   LUMBAR DISC SURGERY     L4-L5   STUMP REVISION Right 01/24/2021   Procedure: right below the knee amputation revision;  Surgeon: [name], MD;  Location: [place];  Service: Orthopedics;  Laterality: Right;   TOE AMPUTATION     right   TONSILLECTOMY     Social History   Occupational History   Occupation: Product manager: Art gallery manager   Occupation: retired  Tobacco Use   Smoking status: Every Day    Packs/day: 1.00    Years: 38.00    Pack years: 38.00    Types:  Cigarettes   Smokeless tobacco: Never  Vaping Use   Vaping Use: Never used  Substance and Sexual Activity   Alcohol use: No    Alcohol/week: 0.0 standard drinks   Drug use: Yes    Frequency: 4.0 times per week    Types: Marijuana   Sexual activity: Not Currently    Birth control/protection: Surgical

## 2021-03-07 ENCOUNTER — Encounter: Payer: Medicare HMO | Admitting: Physician Assistant

## 2021-03-10 ENCOUNTER — Ambulatory Visit (INDEPENDENT_AMBULATORY_CARE_PROVIDER_SITE_OTHER): Payer: Medicare HMO | Admitting: Orthopedic Surgery

## 2021-03-10 ENCOUNTER — Encounter: Payer: Self-pay | Admitting: Orthopedic Surgery

## 2021-03-10 ENCOUNTER — Other Ambulatory Visit: Payer: Self-pay

## 2021-03-10 VITALS — Ht 70.0 in | Wt 240.0 lb

## 2021-03-10 DIAGNOSIS — Z89511 Acquired absence of right leg below knee: Secondary | ICD-10-CM

## 2021-03-13 ENCOUNTER — Telehealth: Payer: Self-pay

## 2021-03-13 ENCOUNTER — Other Ambulatory Visit: Payer: Self-pay | Admitting: Family Medicine

## 2021-03-13 NOTE — Telephone Encounter (Signed)
Patient called concerning right stump draining at incision.  Stated that the draining wasn't as bad as it was the last couple of days.  Advised patient that he could be seen on Friday, 03/14/2021 with Junie Panning Z., but preferred to see Dr. Sharol Given.  Appt.scheduled for Monday, 03/17/2021.  Advised patient to apply a dry dressing to the the right stump, per Autumn F.and to call back if she has any other issues.  Patient voiced that she understands.

## 2021-03-17 ENCOUNTER — Ambulatory Visit (INDEPENDENT_AMBULATORY_CARE_PROVIDER_SITE_OTHER): Payer: Medicare HMO | Admitting: Orthopedic Surgery

## 2021-03-17 DIAGNOSIS — Z89511 Acquired absence of right leg below knee: Secondary | ICD-10-CM

## 2021-03-17 DIAGNOSIS — T8130XA Disruption of wound, unspecified, initial encounter: Secondary | ICD-10-CM

## 2021-03-20 ENCOUNTER — Ambulatory Visit (INDEPENDENT_AMBULATORY_CARE_PROVIDER_SITE_OTHER): Payer: Medicare HMO | Admitting: Orthopedic Surgery

## 2021-03-20 ENCOUNTER — Encounter: Payer: Self-pay | Admitting: Orthopedic Surgery

## 2021-03-20 DIAGNOSIS — Z89511 Acquired absence of right leg below knee: Secondary | ICD-10-CM

## 2021-03-20 NOTE — Progress Notes (Addendum)
Office Visit Note   Patient: [name]           Date of Birth: 02-17-1961           MRN: [id number] Visit Date: 03/20/2021              Requested by: Janora Norlander, DO [place],   [id number] PCP: Janora Norlander, DO  Chief Complaint  Patient presents with   Right Leg - Routine Post Op    8/1*/22 right BKA revision       HPI: Patient is a 60 year old woman 2 months status post right transtibial amputation revision.  Currently on doxycycline wearing a stump shrinker swelling has decreased with a small amount of drainage.  Patient is on Xarelto aspirin and Plavix.  Assessment & Plan: Visit Diagnoses:  1. Acquired absence of right leg below knee [facility])     Plan: Patient will continue her doxycycline will follow-up with [facility] for prosthetic fitting.  Follow-Up Instructions: Return in about 3 weeks (around 04/10/2021).   Ortho Exam  Patient is alert, oriented, no adenopathy, well-dressed, normal affect, normal respiratory effort. Examination there is no cellulitis minimal swelling she has a small 3 x 1 mm wound over the medial aspect of the amputation with healthy granulation tissue.  Patient's leg is healing well and she will need a revision socket.  Original socket was from 2 years ago prior to the revision.  Patient is an existing right transtibial  amputee.  Patient's current comorbidities are not expected to impact the ability to function with the prescribed prosthesis. Patient verbally communicates a strong desire to use a prosthesis. Patient currently requires mobility aids to ambulate without a prosthesis.  Expects not to use mobility aids with a new prosthesis.  Patient is a K3 level ambulator that spends a lot of time walking around on uneven terrain over obstacles, up and down stairs, and ambulates with a variable cadence.     Imaging: No results found. No images are attached to the encounter.  Labs: Lab Results   Component Value Date   HGBA1C 8.3 (H) 01/24/2021   HGBA1C 7.5 (H) 12/31/2020   HGBA1C 6.7 10/01/2020   ESRSEDRATE 19 10/25/2012   CRP 0.6 10/25/2012   LABURIC 5.7 12/10/2006   REPTSTATUS 07/09/2018 FINAL 07/04/2018   CULT NO GROWTH 5 DAYS 07/04/2018     Lab Results  Component Value Date   ALBUMIN 3.6 07/19/2020   ALBUMIN 3.7 (L) 06/26/2020   ALBUMIN 3.9 12/27/2019    Lab Results  Component Value Date   MG 1.7 07/08/2018   Lab Results  Component Value Date   VD25OH 67.6 03/29/2019   VD25OH 72.2 12/27/2018   VD25OH 56.8 11/09/2018    No results found for: PREALBUMIN CBC EXTENDED Latest Ref Rng & Units 01/24/2021 07/19/2020 06/26/2020  WBC 4.0 - 10.5 K/uL 15.0(H) 10.5 11.5(H)  RBC 3.87 - 5.11 MIL/uL 5.28(H) 5.35(H) 5.08  HGB 12.0 - 15.0 g/dL 15.1(H) 15.5(H) 14.7  HCT 36.0 - 46.0 % 46.2(H) 47.7(H) 44.0  PLT 150 - 400 K/uL 263 267 273  NEUTROABS 1.7 - 7.7 K/uL - 5.3 -  LYMPHSABS 0.7 - 4.0 K/uL - 3.9 -     There is no height or weight on file to calculate BMI.  Orders:  No orders of the defined types were placed in this encounter.  No orders of the defined types were placed in this encounter.    Procedures: No procedures performed  Clinical Data: No additional findings.  ROS:  All other systems negative, except as noted in the HPI. Review of Systems  Objective: Vital Signs: There were no vitals taken for this visit.  Specialty Comments:  No specialty comments available.  PMFS History: Patient Active Problem List   Diagnosis Date Noted   Wound dehiscence 01/24/2021   Dehiscence of amputation stump ([place])    Abscess 01/07/2021   History of DVT (deep vein thrombosis) 07/03/2019   Statin intolerance 04/19/2019   Vitamin D deficiency 12/27/2018   Type 2 diabetes mellitus with diabetic peripheral angiopathy and gangrene, without long-term current use of insulin ([place]) 12/27/2018   DVT of axillary vein, chronic, bilateral ([place]) 12/27/2018   Hypertension  associated with chronic kidney disease due to type 2 diabetes mellitus ([place]) 12/27/2018   Long term current use of anticoagulant 12/27/2018   Medically noncompliant 12/27/2018   S/P unilateral BKA (below knee amputation), right (HCC)    Subtherapeutic international normalized ratio (INR)    Noncompliance    Leukocytosis    Acute blood loss anemia    Diabetes mellitus type 2 in obese [facility])    Acquired absence of right leg below knee ([place]) 07/08/2018   Mild protein-calorie malnutrition (HCC)    Intermittent claudication ([place]) 05/11/2017   Deep vein thrombosis (DVT) of both lower extremities ([place]) 01/05/2017   Peripheral vascular insufficiency ([place]) 09/25/2015   Hypothyroidism 01/29/2014   Obesity (BMI 30-39.9) 08/16/2013   Impetigo 07/28/2013   Chronic obstructive pulmonary disease ([place])    Malignant neoplasm of uterus ([place])    GERD (gastroesophageal reflux disease) 10/18/2012   Hernia, hiatal 10/18/2012   Hyperlipidemia associated with type 2 diabetes mellitus ([place]) 04/18/2008   SMOKER 04/18/2008   Past Medical History:  Diagnosis Date   Arthritis    COPD (chronic obstructive pulmonary disease) (HCC)    Depression    DM (diabetes mellitus) (HCC)    DVT (deep venous thrombosis) (HCC)    x5   Family history of colon cancer    GERD (gastroesophageal reflux disease)    Hyperlipemia    Hypertension    Hypothyroid    Obesity    Osteomyelitis of great toe of right foot ([place]) 07/04/2018   PVD (peripheral vascular disease) (HCC)    Unilateral complete BKA, right, initial encounter ([place]) 07/11/2018   Unilateral complete BKA, right, subsequent encounter ([place])    Uterine cancer ([place])    UTI (urinary tract infection)     Family History  Problem Relation Age of Onset   Colon cancer Brother 64   Hypertension Mother    Hypothyroidism Mother    Heart disease Father    Diabetes Father    Hyperlipidemia Father    Clotting disorder Brother    Diabetes Brother    Heart disease  Brother     Past Surgical History:  Procedure Laterality Date   ABDOMINAL HYSTERECTOMY     AMPUTATION Right 07/08/2018   Procedure: RIGHT BELOW KNEE AMPUTATION;  Surgeon: [name], MD;  Location: [place];  Service: Orthopedics;  Laterality: Right;   CESAREAN SECTION     FEMORAL BYPASS     x 5   LUMBAR DISC SURGERY     L4-L5   STUMP REVISION Right 01/24/2021   Procedure: right below the knee amputation revision;  Surgeon: [name], MD;  Location: [place];  Service: Orthopedics;  Laterality: Right;   TOE AMPUTATION     right   TONSILLECTOMY     Social  History   Occupational History   Occupation: Product manager: [employer]   Occupation: retired  Tobacco Use   Smoking status: Every Day    Packs/day: 1.00    Years: 38.00    Pack years: 38.00    Types: Cigarettes   Smokeless tobacco: Never  Vaping Use   Vaping Use: Never used  Substance and Sexual Activity   Alcohol use: No    Alcohol/week: 0.0 standard drinks   Drug use: Yes    Frequency: 4.0 times per week    Types: Marijuana   Sexual activity: Not Currently    Birth control/protection: Surgical

## 2021-03-24 ENCOUNTER — Ambulatory Visit: Payer: Medicare HMO | Admitting: Orthopedic Surgery

## 2021-03-29 ENCOUNTER — Encounter: Payer: Self-pay | Admitting: Orthopedic Surgery

## 2021-03-29 NOTE — Progress Notes (Signed)
Office Visit Note   Patient: [name]           Date of Birth: 1960-06-29           MRN: [id number] Visit Date: 03/10/2021              Requested by: Janora Norlander, DO [place],  [place] [address] PCP: Janora Norlander, DO  Chief Complaint  Patient presents with   Right Leg - Follow-up    01/24/2021 Right BKA      HPI: Patient is a 60 year old woman who is status post right transtibial amputation she feels like she is doing well denies any pain swelling has decreased with using her stump shrinker.  Assessment & Plan: Visit Diagnoses:  1. Acquired absence of right leg below knee [facility])     Plan: Patient has an appointment for prosthetic fitting in 10 days.  Patient will resume her doxycycline.  Follow-Up Instructions: Return in about 2 weeks (around 03/24/2021).   Ortho Exam  Patient is alert, oriented, no adenopathy, well-dressed, normal affect, normal respiratory effort. Examination patient has some swelling from her leg being dependent but no drainage.  There is no warmth.  She is using a extra-large stump shrinker.  Imaging: No results found. No images are attached to the encounter.  Labs: Lab Results  Component Value Date   HGBA1C 8.3 (H) 01/24/2021   HGBA1C 7.5 (H) 12/31/2020   HGBA1C 6.7 10/01/2020   ESRSEDRATE 19 10/25/2012   CRP 0.6 10/25/2012   LABURIC 5.7 12/10/2006   REPTSTATUS 07/09/2018 FINAL 07/04/2018   CULT NO GROWTH 5 DAYS 07/04/2018     Lab Results  Component Value Date   ALBUMIN 3.6 07/19/2020   ALBUMIN 3.7 (L) 06/26/2020   ALBUMIN 3.9 12/27/2019    Lab Results  Component Value Date   MG 1.7 07/08/2018   Lab Results  Component Value Date   VD25OH 67.6 03/29/2019   VD25OH 72.2 12/27/2018   VD25OH 56.8 11/09/2018    No results found for: PREALBUMIN CBC EXTENDED Latest Ref Rng & Units 01/24/2021 07/19/2020 06/26/2020  WBC 4.0 - 10.5 K/uL 15.0(H) 10.5 11.5(H)  RBC 3.87 - 5.11 MIL/uL 5.28(H) 5.35(H)  5.08  HGB 12.0 - 15.0 g/dL 15.1(H) 15.5(H) 14.7  HCT 36.0 - 46.0 % 46.2(H) 47.7(H) 44.0  PLT 150 - 400 K/uL 263 267 273  NEUTROABS 1.7 - 7.7 K/uL - 5.3 -  LYMPHSABS 0.7 - 4.0 K/uL - 3.9 -     Body mass index is 34.44 kg/m.  Orders:  No orders of the defined types were placed in this encounter.  No orders of the defined types were placed in this encounter.    Procedures: No procedures performed  Clinical Data: No additional findings.  ROS:  All other systems negative, except as noted in the HPI. Review of Systems  Objective: Vital Signs: Ht 5\' 10"  (1.778 m)   Wt 240 lb (108.9 kg)   BMI 34.44 kg/m   Specialty Comments:  No specialty comments available.  PMFS History: Patient Active Problem List   Diagnosis Date Noted   Wound dehiscence 01/24/2021   Dehiscence of amputation stump ([place])    Abscess 01/07/2021   History of DVT (deep vein thrombosis) 07/03/2019   Statin intolerance 04/19/2019   Vitamin D deficiency 12/27/2018   Type 2 diabetes mellitus with diabetic peripheral angiopathy and gangrene, without long-term current use of insulin ([place]) 12/27/2018   DVT of axillary vein, chronic, bilateral ([place])  12/27/2018   Hypertension associated with chronic kidney disease due to type 2 diabetes mellitus ([place]) 12/27/2018   Long term current use of anticoagulant 12/27/2018   Medically noncompliant 12/27/2018   S/P unilateral BKA (below knee amputation), right (HCC)    Subtherapeutic international normalized ratio (INR)    Noncompliance    Leukocytosis    Acute blood loss anemia    Diabetes mellitus type 2 in obese [facility])    Acquired absence of right leg below knee ([place]) 07/08/2018   Mild protein-calorie malnutrition (HCC)    Intermittent claudication ([place]) 05/11/2017   Deep vein thrombosis (DVT) of both lower extremities ([place]) 01/05/2017   Peripheral vascular insufficiency ([place]) 09/25/2015   Hypothyroidism 01/29/2014   Obesity (BMI 30-39.9) 08/16/2013   Impetigo  07/28/2013   Chronic obstructive pulmonary disease ([place])    Malignant neoplasm of uterus ([place])    GERD (gastroesophageal reflux disease) 10/18/2012   Hernia, hiatal 10/18/2012   Hyperlipidemia associated with type 2 diabetes mellitus ([place]) 04/18/2008   SMOKER 04/18/2008   Past Medical History:  Diagnosis Date   Arthritis    COPD (chronic obstructive pulmonary disease) (HCC)    Depression    DM (diabetes mellitus) (HCC)    DVT (deep venous thrombosis) (HCC)    x5   Family history of colon cancer    GERD (gastroesophageal reflux disease)    Hyperlipemia    Hypertension    Hypothyroid    Obesity    Osteomyelitis of great toe of right foot ([place]) 07/04/2018   PVD (peripheral vascular disease) (HCC)    Unilateral complete BKA, right, initial encounter ([place]) 07/11/2018   Unilateral complete BKA, right, subsequent encounter ([place])    Uterine cancer ([place])    UTI (urinary tract infection)     Family History  Problem Relation Age of Onset   Colon cancer Brother 87   Hypertension Mother    Hypothyroidism Mother    Heart disease Father    Diabetes Father    Hyperlipidemia Father    Clotting disorder Brother    Diabetes Brother    Heart disease Brother     Past Surgical History:  Procedure Laterality Date   ABDOMINAL HYSTERECTOMY     AMPUTATION Right 07/08/2018   Procedure: RIGHT BELOW KNEE AMPUTATION;  Surgeon: [name], MD;  Location: [facility];  Service: Orthopedics;  Laterality: Right;   CESAREAN SECTION     FEMORAL BYPASS     x 5   LUMBAR DISC SURGERY     L4-L5   STUMP REVISION Right 01/24/2021   Procedure: right below the knee amputation revision;  Surgeon: [name], MD;  Location: [facility];  Service: Orthopedics;  Laterality: Right;   TOE AMPUTATION     right   TONSILLECTOMY     Social History   Occupational History   Occupation: Product manager: Art gallery manager   Occupation: retired  Tobacco Use   Smoking status: Every Day    Packs/day: 1.00     Years: 38.00    Pack years: 38.00    Types: Cigarettes   Smokeless tobacco: Never  Vaping Use   Vaping Use: Never used  Substance and Sexual Activity   Alcohol use: No    Alcohol/week: 0.0 standard drinks   Drug use: Yes    Frequency: 4.0 times per week    Types: Marijuana   Sexual activity: Not Currently    Birth control/protection: Surgical

## 2021-04-01 ENCOUNTER — Telehealth: Payer: Self-pay | Admitting: Family Medicine

## 2021-04-01 NOTE — Telephone Encounter (Signed)
Pt stated that a lump was found in her breast today and she needs to schedule a mammogram. Please call back and advise.

## 2021-04-02 ENCOUNTER — Other Ambulatory Visit: Payer: Self-pay

## 2021-04-02 ENCOUNTER — Encounter: Payer: Self-pay | Admitting: Orthopedic Surgery

## 2021-04-02 DIAGNOSIS — N6320 Unspecified lump in the left breast, unspecified quadrant: Secondary | ICD-10-CM

## 2021-04-02 NOTE — Progress Notes (Signed)
Office Visit Note   Patient: [name]           Date of Birth: June 05, 1961           MRN: [id number] Visit Date: 03/17/2021              Requested by: Janora Norlander, DO [place],  [place] [address] PCP: Janora Norlander, DO  No chief complaint on file.     HPI: Patient is a 60 year old woman who presents 2 months status post revision right transtibial amputation.  Patient started antibiotics 3 days ago she states she is doing well has decreased swelling.  Assessment & Plan: Visit Diagnoses:  1. Acquired absence of right leg below knee ([place])   2. Wound dehiscence     Plan: Patient is currently on doxycycline.  Will reevaluate on Thursday.  Follow-Up Instructions: Return in about 1 week (around 03/24/2021).   Ortho Exam  Patient is alert, oriented, no adenopathy, well-dressed, normal affect, normal respiratory effort. Examination the abscess has decompressed there is no tenderness no swelling.  The wound is 5 mm in diameter and 2 cm deep.  Imaging: No results found. No images are attached to the encounter.  Labs: Lab Results  Component Value Date   HGBA1C 8.3 (H) 01/24/2021   HGBA1C 7.5 (H) 12/31/2020   HGBA1C 6.7 10/01/2020   ESRSEDRATE 19 10/25/2012   CRP 0.6 10/25/2012   LABURIC 5.7 12/10/2006   REPTSTATUS 07/09/2018 FINAL 07/04/2018   CULT NO GROWTH 5 DAYS 07/04/2018     Lab Results  Component Value Date   ALBUMIN 3.6 07/19/2020   ALBUMIN 3.7 (L) 06/26/2020   ALBUMIN 3.9 12/27/2019    Lab Results  Component Value Date   MG 1.7 07/08/2018   Lab Results  Component Value Date   VD25OH 67.6 03/29/2019   VD25OH 72.2 12/27/2018   VD25OH 56.8 11/09/2018    No results found for: PREALBUMIN CBC EXTENDED Latest Ref Rng & Units 01/24/2021 07/19/2020 06/26/2020  WBC 4.0 - 10.5 K/uL 15.0(H) 10.5 11.5(H)  RBC 3.87 - 5.11 MIL/uL 5.28(H) 5.35(H) 5.08  HGB 12.0 - 15.0 g/dL 15.1(H) 15.5(H) 14.7  HCT 36.0 - 46.0 % 46.2(H) 47.7(H)  44.0  PLT 150 - 400 K/uL 263 267 273  NEUTROABS 1.7 - 7.7 K/uL - 5.3 -  LYMPHSABS 0.7 - 4.0 K/uL - 3.9 -     There is no height or weight on file to calculate BMI.  Orders:  No orders of the defined types were placed in this encounter.  No orders of the defined types were placed in this encounter.    Procedures: No procedures performed  Clinical Data: No additional findings.  ROS:  All other systems negative, except as noted in the HPI. Review of Systems  Objective: Vital Signs: There were no vitals taken for this visit.  Specialty Comments:  No specialty comments available.  PMFS History: Patient Active Problem List   Diagnosis Date Noted   Wound dehiscence 01/24/2021   Dehiscence of amputation stump ([place])    Abscess 01/07/2021   History of DVT (deep vein thrombosis) 07/03/2019   Statin intolerance 04/19/2019   Vitamin D deficiency 12/27/2018   Type 2 diabetes mellitus with diabetic peripheral angiopathy and gangrene, without long-term current use of insulin ([place]) 12/27/2018   DVT of axillary vein, chronic, bilateral ([place]) 12/27/2018   Hypertension associated with chronic kidney disease due to type 2 diabetes mellitus ([place]) 12/27/2018   Long term current  use of anticoagulant 12/27/2018   Medically noncompliant 12/27/2018   S/P unilateral BKA (below knee amputation), right (HCC)    Subtherapeutic international normalized ratio (INR)    Noncompliance    Leukocytosis    Acute blood loss anemia    Diabetes mellitus type 2 in obese [facility])    Acquired absence of right leg below knee ([place]) 07/08/2018   Mild protein-calorie malnutrition (HCC)    Intermittent claudication ([place]) 05/11/2017   Deep vein thrombosis (DVT) of both lower extremities ([place]) 01/05/2017   Peripheral vascular insufficiency ([place]) 09/25/2015   Hypothyroidism 01/29/2014   Obesity (BMI 30-39.9) 08/16/2013   Impetigo 07/28/2013   Chronic obstructive pulmonary disease ([place])    Malignant neoplasm of  uterus ([place])    GERD (gastroesophageal reflux disease) 10/18/2012   Hernia, hiatal 10/18/2012   Hyperlipidemia associated with type 2 diabetes mellitus ([place]) 04/18/2008   SMOKER 04/18/2008   Past Medical History:  Diagnosis Date   Arthritis    COPD (chronic obstructive pulmonary disease) (HCC)    Depression    DM (diabetes mellitus) (HCC)    DVT (deep venous thrombosis) (HCC)    x5   Family history of colon cancer    GERD (gastroesophageal reflux disease)    Hyperlipemia    Hypertension    Hypothyroid    Obesity    Osteomyelitis of great toe of right foot ([place]) 07/04/2018   PVD (peripheral vascular disease) (HCC)    Unilateral complete BKA, right, initial encounter ([place]) 07/11/2018   Unilateral complete BKA, right, subsequent encounter ([place])    Uterine cancer ([place])    UTI (urinary tract infection)     Family History  Problem Relation Age of Onset   Colon cancer Brother 44   Hypertension Mother    Hypothyroidism Mother    Heart disease Father    Diabetes Father    Hyperlipidemia Father    Clotting disorder Brother    Diabetes Brother    Heart disease Brother     Past Surgical History:  Procedure Laterality Date   ABDOMINAL HYSTERECTOMY     AMPUTATION Right 07/08/2018   Procedure: RIGHT BELOW KNEE AMPUTATION;  Surgeon: [name], MD;  Location: [facility];  Service: Orthopedics;  Laterality: Right;   CESAREAN SECTION     FEMORAL BYPASS     x 5   LUMBAR DISC SURGERY     L4-L5   STUMP REVISION Right 01/24/2021   Procedure: right below the knee amputation revision;  Surgeon: [name], MD;  Location: [facility];  Service: Orthopedics;  Laterality: Right;   TOE AMPUTATION     right   TONSILLECTOMY     Social History   Occupational History   Occupation: Product manager: Art gallery manager   Occupation: retired  Tobacco Use   Smoking status: Every Day    Packs/day: 1.00    Years: 38.00    Pack years: 38.00    Types: Cigarettes   Smokeless tobacco: Never   Vaping Use   Vaping Use: Never used  Substance and Sexual Activity   Alcohol use: No    Alcohol/week: 0.0 standard drinks   Drug use: Yes    Frequency: 4.0 times per week    Types: Marijuana   Sexual activity: Not Currently    Birth control/protection: Surgical

## 2021-04-03 ENCOUNTER — Other Ambulatory Visit: Payer: Self-pay | Admitting: Family Medicine

## 2021-04-03 DIAGNOSIS — Z9889 Other specified postprocedural states: Secondary | ICD-10-CM

## 2021-04-03 DIAGNOSIS — M5441 Lumbago with sciatica, right side: Secondary | ICD-10-CM

## 2021-04-03 NOTE — Telephone Encounter (Signed)
Spoke to patient - she states that she has noticed lumps in her left breast - patient has not had a mammogram in over a year. Orders place for diagnostic mm and possible ultrasounds.

## 2021-04-07 ENCOUNTER — Ambulatory Visit (INDEPENDENT_AMBULATORY_CARE_PROVIDER_SITE_OTHER): Payer: Medicare HMO | Admitting: Family Medicine

## 2021-04-07 ENCOUNTER — Other Ambulatory Visit: Payer: Self-pay

## 2021-04-07 ENCOUNTER — Encounter: Payer: Self-pay | Admitting: Family Medicine

## 2021-04-07 VITALS — BP 132/64 | HR 86 | Temp 97.7°F | Ht 70.0 in | Wt 240.4 lb

## 2021-04-07 DIAGNOSIS — I129 Hypertensive chronic kidney disease with stage 1 through stage 4 chronic kidney disease, or unspecified chronic kidney disease: Secondary | ICD-10-CM

## 2021-04-07 DIAGNOSIS — E1169 Type 2 diabetes mellitus with other specified complication: Secondary | ICD-10-CM

## 2021-04-07 DIAGNOSIS — E1152 Type 2 diabetes mellitus with diabetic peripheral angiopathy with gangrene: Secondary | ICD-10-CM | POA: Diagnosis not present

## 2021-04-07 DIAGNOSIS — Z7901 Long term (current) use of anticoagulants: Secondary | ICD-10-CM

## 2021-04-07 DIAGNOSIS — E1122 Type 2 diabetes mellitus with diabetic chronic kidney disease: Secondary | ICD-10-CM

## 2021-04-07 DIAGNOSIS — E785 Hyperlipidemia, unspecified: Secondary | ICD-10-CM

## 2021-04-07 DIAGNOSIS — Z89511 Acquired absence of right leg below knee: Secondary | ICD-10-CM | POA: Diagnosis not present

## 2021-04-07 DIAGNOSIS — E039 Hypothyroidism, unspecified: Secondary | ICD-10-CM | POA: Diagnosis not present

## 2021-04-07 LAB — BAYER DCA HB A1C WAIVED: HB A1C (BAYER DCA - WAIVED): 7.5 % — ABNORMAL HIGH (ref 4.8–5.6)

## 2021-04-07 NOTE — Progress Notes (Signed)
Subjective: CC: DM PCP: Janora Norlander, DO [id number] [name] is a 60 y.o. female presenting to clinic today for:  1. Type 2 Diabetes with hypertension, hyperlipidemia, s/p BKA:  Compliant with metformin, Amaryl and Cozaar.  She admits blood sugars have been higher since her surgery.  Last eye exam: UTD Last foot exam: needs Last A1c:  Lab Results  Component Value Date   HGBA1C 8.3 (H) 01/24/2021   Nephropathy screen indicated?: on ARB Last flu, zoster and/or pneumovax:  Immunization History  Administered Date(s) Administered   PFIZER(Purple Top)SARS-COV-2 Vaccination 01/25/2020, 02/15/2020   Td 06/08/2005    ROS: s/p revision Right BKA.  Not as physically active due to her prosthetic being changed out.  No chest pain, shortness of breath  2.  Hypothyroidism Compliant with thyroid medications.  No change in voice, difficulty swallowing or tremor reported    ROS: Per HPI  Allergies  Allergen Reactions   Aleve [Naproxen Sodium] Hives and Itching   Cortisone Swelling    Internal organs   Prednisone Swelling    Makes internal organs swell   Statins Other (See Comments)    Myalgia even with Pravastatin   Vancomycin Itching   Past Medical History:  Diagnosis Date   Arthritis    COPD (chronic obstructive pulmonary disease) (HCC)    Depression    DM (diabetes mellitus) (HCC)    DVT (deep venous thrombosis) (HCC)    x5   Family history of colon cancer    GERD (gastroesophageal reflux disease)    Hyperlipemia    Hypertension    Hypothyroid    Obesity    Osteomyelitis of great toe of right foot ([place]) 07/04/2018   PVD (peripheral vascular disease) ([place])    Unilateral complete BKA, right, initial encounter ([place]) 07/11/2018   Unilateral complete BKA, right, subsequent encounter ([place])    Uterine cancer ([place])    UTI (urinary tract infection)     Current Outpatient Medications:    Accu-Chek Softclix Lancets lancets, Use as instructed to test BGs 1-2  times daily E11.52, Disp: 100 each, Rfl: 12   ascorbic acid (VITAMIN C) 500 MG tablet, Take 500 mg by mouth daily., Disp: , Rfl:    aspirin EC 81 MG EC tablet, Take 1 tablet (81 mg total) by mouth daily., Disp: , Rfl:    Cholecalciferol (VITAMIN D3) 5000 units TABS, Take 5,000 Units by mouth daily., Disp: , Rfl:    clopidogrel (PLAVIX) 75 MG tablet, TAKE 1 TABLET DAILY, Disp: 90 tablet, Rfl: 0   diclofenac Sodium (VOLTAREN) 1 % GEL, Apply 2 g topically 4 (four) times daily. Prn pain (Patient taking differently: Apply 2 g topically 4 (four) times daily as needed (pain.).), Disp: 300 g, Rfl: 1   escitalopram (LEXAPRO) 10 MG tablet, TAKE 1 TABLET DAILY, Disp: 90 tablet, Rfl: 0   ezetimibe (ZETIA) 10 MG tablet, Take 1 tablet (10 mg total) by mouth daily., Disp: 90 tablet, Rfl: 3   furosemide (LASIX) 20 MG tablet, TAKE 1 TABLET DAILY, Disp: 90 tablet, Rfl: 0   gabapentin (NEURONTIN) 300 MG capsule, TAKE 1 CAPSULE AT BEDTIME, Disp: 30 capsule, Rfl: 0   glimepiride (AMARYL) 4 MG tablet, TAKE ONE TABLET BY MOUTH EVERY DAY WITH BREAKFAST, Disp: 30 tablet, Rfl: 2   glucose blood test strip, accucheck E11.52; Test BGs 1-2 times daily as directed, Disp: 100 each, Rfl: 12   levothyroxine (SYNTHROID) 100 MCG tablet, Take 1 tablet (100 mcg total) by mouth daily before breakfast.,  Disp: 90 tablet, Rfl: 3   losartan (COZAAR) 50 MG tablet, TAKE 1 TABLET DAILY, Disp: 90 tablet, Rfl: 0   metFORMIN (GLUCOPHAGE XR) 500 MG 24 hr tablet, Take 2 tablets (1,000 mg total) by mouth in the morning and at bedtime. (Patient taking differently: Take 2,000 mg by mouth in the morning.), Disp: 360 tablet, Rfl: 2   pantoprazole (PROTONIX) 40 MG tablet, TAKE 1 TABLET DAILY, Disp: 90 tablet, Rfl: 0   Potassium 99 MG TABS, Take 99 mg by mouth in the morning., Disp: , Rfl:    rivaroxaban (XARELTO) 20 MG TABS tablet, Take 1 tablet (20 mg total) by mouth daily with supper. (Patient taking differently: Take 20 mg by mouth in the morning.),  Disp: 90 tablet, Rfl: 3   sennosides-docusate sodium (SENOKOT-S) 8.6-50 MG tablet, Take 1 tablet by mouth in the morning., Disp: , Rfl:    Tiotropium Bromide Monohydrate (SPIRIVA RESPIMAT) 2.5 MCG/ACT AERS, Inhale 2 puffs into the lungs daily. (Patient taking differently: Inhale 2 puffs into the lungs 3 (three) times a week.), Disp: 8 g, Rfl: 0   tretinoin (RETIN-A) 0.05 % cream, Apply topically at bedtime. 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  Tobacco Use   Smoking status: Every Day    Packs/day: 1.00    Years: 38.00    Pack years: 38.00    Types: Cigarettes   Smokeless tobacco: Never  Vaping Use   Vaping Use: Never used  Substance and Sexual Activity   Alcohol use: No    Alcohol/week: 0.0 standard drinks   Drug use: Yes    Frequency: 4.0 times per week    Types: Marijuana   Sexual activity: Not Currently    Birth control/protection: Surgical  Other Topics Concern   Not on file  Social History Narrative   Not on file   Social Determinants of Health   Financial Resource Strain: Low Risk    Difficulty of Paying Living Expenses: Not hard at all  Food Insecurity: No Food Insecurity   Worried About Charity fundraiser in the Last Year: Never true   [place] in the Last Year: Never true  Transportation Needs: No Transportation Needs   Lack of Transportation (Medical): No    Lack of Transportation (Non-Medical): No  Physical Activity: Inactive   Days of Exercise per Week: 0 days   Minutes of Exercise per Session: 0 min  Stress: No Stress Concern Present   Feeling of Stress : Not at all  Social Connections: Moderately Integrated   Frequency of Communication with Friends and Family: More than three times a week   Frequency of Social Gatherings with Friends and Family: More than three times a week   Attends Religious Services: More than 4 times per year   Active Member of [employer] or Organizations: Yes   Attends Archivist Meetings: More than  4 times per year   Marital Status: Divorced  Human resources officer Violence: Not At Risk   Fear of Current or Ex-Partner: No   Emotionally Abused: No   Physically Abused: No   Sexually Abused: No   Family History  Problem Relation Age of Onset   Colon cancer Brother 67   Hypertension Mother    Hypothyroidism Mother    Heart disease Father    Diabetes Father    Hyperlipidemia Father    Clotting disorder Brother    Diabetes Brother    Heart disease Brother     Objective: Office vital signs reviewed. BP 132/64   Pulse 86   Temp 97.7 F (36.5 C)   Ht 5\' 10"  (1.778 m)   Wt 240 lb 6.4 oz (109 kg)   SpO2 94%   BMI 34.49 kg/m   Physical Examination:  General: Awake, alert, well nourished, No acute distress HEENT: Normal, sclera white, MMM Cardio: regular rate and rhythm, S1S2 heard, no murmurs appreciated Pulm: clear to auscultation bilaterally, no wheezes, rhonchi or rales; normal work of breathing on room air MSK: Right stump appears to be healing well and shows no evidence of erythema.  Minimal light beige drainage noted on the bandage  Assessment/ Plan: 60 y.o. female   Type 2 diabetes mellitus with diabetic peripheral angiopathy and gangrene, without long-term current use of insulin ([place]) - Plan: Bayer DCA Hb A1c Waived  Acquired hypothyroidism - Plan: TSH, T4, free  S/P unilateral BKA (below  knee amputation), right (HCC)  Hyperlipidemia associated with type 2 diabetes mellitus ([place])  Hypertension associated with chronic kidney disease due to type 2 diabetes mellitus ([place])  On anticoagulant therapy  Sugar not at goal.  A1c was 7.5.  This is slightly better however than check in August.  Like to see her back again in 3 months.  Would not make any medical changes at this time due to recent infection, amputation.  I think that some of this rise is likely reactive.  If sugars persistently above goal at her 34-month follow-up, we will plan to add Tradjenta or similar  Check thyroid levels.  She is asymptomatic  No evidence of ongoing infection or complication status post revision BKA on the right.  Would strongly suggest use of doxycycline going forward since she seems to respond to this best  Not yet due for fasting lipid.  Blood pressure well controlled.  No changes  No active bleeding.  Continues to get Xarelto without difficulty.  She will set up an appointment with Almyra Free to get next years forms completed  Orders Placed This Encounter  Procedures   TSH   T4, free   Bayer DCA Hb A1c Waived   No orders of the defined types were placed in this encounter.    Janora Norlander, DO [place] [phone]

## 2021-04-08 ENCOUNTER — Other Ambulatory Visit: Payer: Self-pay | Admitting: Family Medicine

## 2021-04-08 ENCOUNTER — Ambulatory Visit: Payer: Medicare HMO | Admitting: Orthopedic Surgery

## 2021-04-08 ENCOUNTER — Telehealth: Payer: Self-pay

## 2021-04-08 LAB — T4, FREE: Free T4: 1.12 ng/dL (ref 0.82–1.77)

## 2021-04-08 LAB — TSH: TSH: 2.6 u[IU]/mL (ref 0.450–4.500)

## 2021-04-08 NOTE — Telephone Encounter (Signed)
Pt s/p revision BKA and needs addendum to the 03/20/21 note for a socket replacement for [employer]. Plese let me know when this has been done and I will fax

## 2021-04-09 ENCOUNTER — Other Ambulatory Visit: Payer: Self-pay | Admitting: Family Medicine

## 2021-04-09 DIAGNOSIS — E1165 Type 2 diabetes mellitus with hyperglycemia: Secondary | ICD-10-CM

## 2021-04-09 NOTE — Telephone Encounter (Signed)
Faxed to [facility]

## 2021-04-14 ENCOUNTER — Other Ambulatory Visit: Payer: Self-pay

## 2021-04-14 ENCOUNTER — Ambulatory Visit (INDEPENDENT_AMBULATORY_CARE_PROVIDER_SITE_OTHER): Payer: Medicare HMO | Admitting: Orthopedic Surgery

## 2021-04-14 DIAGNOSIS — Z89511 Acquired absence of right leg below knee: Secondary | ICD-10-CM

## 2021-04-15 ENCOUNTER — Encounter: Payer: Self-pay | Admitting: Orthopedic Surgery

## 2021-04-15 NOTE — Progress Notes (Signed)
Office Visit Note   Patient: [name]           Date of Birth: [birth year]           MRN: [id number] Visit Date: 04/14/2021              Requested by: Janora Norlander, DO [place],  [place] [address] PCP: Janora Norlander, DO  Chief Complaint  Patient presents with   Right Leg - Follow-up    01/06/21 revision right BKA       HPI: Patient is a 60 year old woman who presents over 3 months status post right transtibial amputation patient is currently on Xarelto aspirin and Plavix.  Patient has completed her doxycycline.  She is currently wearing a stump shrinker.  Patient complains of a recurrent sacral cyst.  Assessment & Plan: Visit Diagnoses:  1. Acquired absence of right leg below knee ([place])     Plan: Recommended following up with either her primary care or general surgeon for evaluation of the sacral cyst.  Follow-up with the prosthetists for leg fitting in 1 week.  Patient states she does not need physical therapy for gait training.  Follow-Up Instructions: Return in about 4 weeks (around 05/12/2021).   Ortho Exam  Patient is alert, oriented, no adenopathy, well-dressed, normal affect, normal respiratory effort. Examination there is a small amount of drainage with decreased swelling.  Patient has 1 small new wound medially that is 1 mm in diameter.  This seems consistent with a stitch abscess there is no cellulitis no tenderness to palpation.  Imaging: No results found. No images are attached to the encounter.  Labs: Lab Results  Component Value Date   HGBA1C 7.5 (H) 04/07/2021   HGBA1C 8.3 (H) 01/24/2021   HGBA1C 7.5 (H) 12/31/2020   ESRSEDRATE 19 10/25/2012   CRP 0.6 10/25/2012   LABURIC 5.7 12/10/2006   REPTSTATUS 07/09/2018 FINAL 07/04/2018   CULT NO GROWTH 5 DAYS 07/04/2018     Lab Results  Component Value Date   ALBUMIN 3.6 07/19/2020   ALBUMIN 3.7 (L) 06/26/2020   ALBUMIN 3.9 12/27/2019    Lab Results  Component Value  Date   MG 1.7 07/08/2018   Lab Results  Component Value Date   VD25OH 67.6 03/29/2019   VD25OH 72.2 12/27/2018   VD25OH 56.8 11/09/2018    No results found for: PREALBUMIN CBC EXTENDED Latest Ref Rng & Units 01/24/2021 07/19/2020 06/26/2020  WBC 4.0 - 10.5 K/uL 15.0(H) 10.5 11.5(H)  RBC 3.87 - 5.11 MIL/uL 5.28(H) 5.35(H) 5.08  HGB 12.0 - 15.0 g/dL 15.1(H) 15.5(H) 14.7  HCT 36.0 - 46.0 % 46.2(H) 47.7(H) 44.0  PLT 150 - 400 K/uL 263 267 273  NEUTROABS 1.7 - 7.7 K/uL - 5.3 -  LYMPHSABS 0.7 - 4.0 K/uL - 3.9 -     There is no height or weight on file to calculate BMI.  Orders:  No orders of the defined types were placed in this encounter.  No orders of the defined types were placed in this encounter.    Procedures: No procedures performed  Clinical Data: No additional findings.  ROS:  All other systems negative, except as noted in the HPI. Review of Systems  Objective: Vital Signs: There were no vitals taken for this visit.  Specialty Comments:  No specialty comments available.  PMFS History: Patient Active Problem List   Diagnosis Date Noted   Wound dehiscence 01/24/2021   Dehiscence of amputation stump ([place])  Abscess 01/07/2021   History of DVT (deep vein thrombosis) 07/03/2019   Statin intolerance 04/19/2019   Vitamin D deficiency 12/27/2018   Type 2 diabetes mellitus with diabetic peripheral angiopathy and gangrene, without long-term current use of insulin ([place]) 12/27/2018   DVT of axillary vein, chronic, bilateral ([place]) 12/27/2018   Hypertension associated with chronic kidney disease due to type 2 diabetes mellitus ([place]) 12/27/2018   Long term current use of anticoagulant 12/27/2018   Medically noncompliant 12/27/2018   S/P unilateral BKA (below knee amputation), right (HCC)    Subtherapeutic international normalized ratio (INR)    Noncompliance    Leukocytosis    Acute blood loss anemia    Diabetes mellitus type 2 in obese [facility])    Acquired absence  of right leg below knee ([place]) 07/08/2018   Mild protein-calorie malnutrition (HCC)    Intermittent claudication ([place]) 05/11/2017   Deep vein thrombosis (DVT) of both lower extremities ([place]) 01/05/2017   Peripheral vascular insufficiency ([place]) 09/25/2015   Hypothyroidism 01/29/2014   Obesity (BMI 30-39.9) 08/16/2013   Impetigo 07/28/2013   Chronic obstructive pulmonary disease ([place])    Malignant neoplasm of uterus ([place])    GERD (gastroesophageal reflux disease) 10/18/2012   Hernia, hiatal 10/18/2012   Hyperlipidemia associated with type 2 diabetes mellitus ([place]) 04/18/2008   SMOKER 04/18/2008   Past Medical History:  Diagnosis Date   Arthritis    COPD (chronic obstructive pulmonary disease) (HCC)    Depression    DM (diabetes mellitus) (HCC)    DVT (deep venous thrombosis) (HCC)    x5   Family history of colon cancer    GERD (gastroesophageal reflux disease)    Hyperlipemia    Hypertension    Hypothyroid    Obesity    Osteomyelitis of great toe of right foot ([place]) 07/04/2018   PVD (peripheral vascular disease) (HCC)    Unilateral complete BKA, right, initial encounter ([place]) 07/11/2018   Unilateral complete BKA, right, subsequent encounter ([place])    Uterine cancer ([place])    UTI (urinary tract infection)     Family History  Problem Relation Age of Onset   Colon cancer Brother 4   Hypertension Mother    Hypothyroidism Mother    Heart disease Father    Diabetes Father    Hyperlipidemia Father    Clotting disorder Brother    Diabetes Brother    Heart disease Brother     Past Surgical History:  Procedure Laterality Date   ABDOMINAL HYSTERECTOMY     AMPUTATION Right 07/08/2018   Procedure: RIGHT BELOW KNEE AMPUTATION;  Surgeon: [name], MD;  Location: [facility];  Service: Orthopedics;  Laterality: Right;   CESAREAN SECTION     FEMORAL BYPASS     x 5   LUMBAR DISC SURGERY     L4-L5   STUMP REVISION Right 01/24/2021   Procedure: right below the knee amputation  revision;  Surgeon: [name], MD;  Location: [facility];  Service: Orthopedics;  Laterality: Right;   TOE AMPUTATION     right   TONSILLECTOMY     Social History   Occupational History   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

## 2021-04-17 LAB — FLOW CYTOMETRY

## 2021-04-18 ENCOUNTER — Other Ambulatory Visit: Payer: Self-pay | Admitting: Family Medicine

## 2021-04-23 ENCOUNTER — Other Ambulatory Visit: Payer: Self-pay | Admitting: Family Medicine

## 2021-04-23 ENCOUNTER — Ambulatory Visit
Admission: RE | Admit: 2021-04-23 | Discharge: 2021-04-23 | Disposition: A | Discharge status: Home / Self Care | Payer: Medicare HMO | Source: Ambulatory Visit | Attending: Family Medicine | Admitting: Family Medicine

## 2021-04-23 ENCOUNTER — Ambulatory Visit: Payer: Medicare HMO

## 2021-04-23 ENCOUNTER — Other Ambulatory Visit: Payer: Self-pay

## 2021-04-23 DIAGNOSIS — N6320 Unspecified lump in the left breast, unspecified quadrant: Secondary | ICD-10-CM

## 2021-04-23 DIAGNOSIS — R928 Other abnormal and inconclusive findings on diagnostic imaging of breast: Secondary | ICD-10-CM | POA: Diagnosis not present

## 2021-04-23 DIAGNOSIS — Z89511 Acquired absence of right leg below knee: Secondary | ICD-10-CM | POA: Diagnosis not present

## 2021-04-23 DIAGNOSIS — N6321 Unspecified lump in the left breast, upper outer quadrant: Secondary | ICD-10-CM | POA: Diagnosis not present

## 2021-05-03 ENCOUNTER — Other Ambulatory Visit: Payer: Self-pay | Admitting: Family Medicine

## 2021-05-03 DIAGNOSIS — M5441 Lumbago with sciatica, right side: Secondary | ICD-10-CM

## 2021-05-03 DIAGNOSIS — Z9889 Other specified postprocedural states: Secondary | ICD-10-CM

## 2021-05-05 ENCOUNTER — Other Ambulatory Visit: Payer: Self-pay | Admitting: Family Medicine

## 2021-05-05 DIAGNOSIS — F339 Major depressive disorder, recurrent, unspecified: Secondary | ICD-10-CM

## 2021-05-05 DIAGNOSIS — K219 Gastro-esophageal reflux disease without esophagitis: Secondary | ICD-10-CM

## 2021-05-19 ENCOUNTER — Ambulatory Visit (INDEPENDENT_AMBULATORY_CARE_PROVIDER_SITE_OTHER): Payer: Medicare HMO | Admitting: Orthopedic Surgery

## 2021-05-19 ENCOUNTER — Encounter: Payer: Self-pay | Admitting: Orthopedic Surgery

## 2021-05-19 DIAGNOSIS — Z89511 Acquired absence of right leg below knee: Secondary | ICD-10-CM | POA: Diagnosis not present

## 2021-05-19 NOTE — Progress Notes (Signed)
Office Visit Note   Patient: [name]           Date of Birth: [birth year]           MRN: [id number] Visit Date: 05/19/2021              Requested by: Janora Norlander, DO [place],  [place] [address] PCP: Janora Norlander, DO  Chief Complaint  Patient presents with   Right Leg - Follow-up    S/p Right BKA revision 01/06/21      HPI: Patient is a 60 year old woman who is seen in follow-up for her right transtibial amputation.  Patient has been to the prosthetists for a new socket and liner.  Patient states the under liner sock feels good but states she has decreased stability with walking with it in place.  Assessment & Plan: Visit Diagnoses:  1. Acquired absence of right leg below knee (HCC)     Plan: Wear the shrinker at all times when she is not wearing her leg.  Increase her activities as tolerated follow-up for routine follow-up in 3 months or sooner if she develops an open ulcer.  Follow-Up Instructions: Return in about 3 months (around 08/17/2021).   Ortho Exam  Patient is alert, oriented, no adenopathy, well-dressed, normal affect, normal respiratory effort. Examination the patient has a well consolidated residual limb there is no cellulitis no tenderness to palpation no signs of infection.  The ulcer has essentially healed.  Imaging: No results found. No images are attached to the encounter.  Labs: Lab Results  Component Value Date   HGBA1C 7.5 (H) 04/07/2021   HGBA1C 8.3 (H) 01/24/2021   HGBA1C 7.5 (H) 12/31/2020   ESRSEDRATE 19 10/25/2012   CRP 0.6 10/25/2012   LABURIC 5.7 12/10/2006   REPTSTATUS 07/09/2018 FINAL 07/04/2018   CULT NO GROWTH 5 DAYS 07/04/2018     Lab Results  Component Value Date   ALBUMIN 3.6 07/19/2020   ALBUMIN 3.7 (L) 06/26/2020   ALBUMIN 3.9 12/27/2019    Lab Results  Component Value Date   MG 1.7 07/08/2018   Lab Results  Component Value Date   VD25OH 67.6 03/29/2019   VD25OH 72.2 12/27/2018    VD25OH 56.8 11/09/2018    No results found for: PREALBUMIN CBC EXTENDED Latest Ref Rng & Units 01/24/2021 07/19/2020 06/26/2020  WBC 4.0 - 10.5 K/uL 15.0(H) 10.5 11.5(H)  RBC 3.87 - 5.11 MIL/uL 5.28(H) 5.35(H) 5.08  HGB 12.0 - 15.0 g/dL 15.1(H) 15.5(H) 14.7  HCT 36.0 - 46.0 % 46.2(H) 47.7(H) 44.0  PLT 150 - 400 K/uL 263 267 273  NEUTROABS 1.7 - 7.7 K/uL - 5.3 -  LYMPHSABS 0.7 - 4.0 K/uL - 3.9 -     There is no height or weight on file to calculate BMI.  Orders:  No orders of the defined types were placed in this encounter.  No orders of the defined types were placed in this encounter.    Procedures: No procedures performed  Clinical Data: No additional findings.  ROS:  All other systems negative, except as noted in the HPI. Review of Systems  Objective: Vital Signs: There were no vitals taken for this visit.  Specialty Comments:  No specialty comments available.  PMFS History: Patient Active Problem List   Diagnosis Date Noted   Wound dehiscence 01/24/2021   Dehiscence of amputation stump ([place])    Abscess 01/07/2021   History of DVT (deep vein thrombosis) 07/03/2019   Statin intolerance  04/19/2019   Vitamin D deficiency 12/27/2018   Type 2 diabetes mellitus with diabetic peripheral angiopathy and gangrene, without long-term current use of insulin ([place]) 12/27/2018   DVT of axillary vein, chronic, bilateral ([place]) 12/27/2018   Hypertension associated with chronic kidney disease due to type 2 diabetes mellitus ([place]) 12/27/2018   Long term current use of anticoagulant 12/27/2018   Medically noncompliant 12/27/2018   S/P unilateral BKA (below knee amputation), right (HCC)    Subtherapeutic international normalized ratio (INR)    Noncompliance    Leukocytosis    Acute blood loss anemia    Diabetes mellitus type 2 in obese [facility])    Acquired absence of right leg below knee ([place]) 07/08/2018   Mild protein-calorie malnutrition (HCC)    Intermittent claudication ([place])  05/11/2017   Deep vein thrombosis (DVT) of both lower extremities ([place]) 01/05/2017   Peripheral vascular insufficiency ([place]) 09/25/2015   Hypothyroidism 01/29/2014   Obesity (BMI 30-39.9) 08/16/2013   Impetigo 07/28/2013   Chronic obstructive pulmonary disease ([place])    Malignant neoplasm of uterus ([place])    GERD (gastroesophageal reflux disease) 10/18/2012   Hernia, hiatal 10/18/2012   Hyperlipidemia associated with type 2 diabetes mellitus ([place]) 04/18/2008   SMOKER 04/18/2008   Past Medical History:  Diagnosis Date   Arthritis    COPD (chronic obstructive pulmonary disease) (HCC)    Depression    DM (diabetes mellitus) (HCC)    DVT (deep venous thrombosis) (HCC)    x5   Family history of colon cancer    GERD (gastroesophageal reflux disease)    Hyperlipemia    Hypertension    Hypothyroid    Obesity    Osteomyelitis of great toe of right foot ([place]) 07/04/2018   PVD (peripheral vascular disease) ([place])    Unilateral complete BKA, right, initial encounter ([place]) 07/11/2018   Unilateral complete BKA, right, subsequent encounter ([place])    Uterine cancer ([place])    UTI (urinary tract infection)     Family History  Problem Relation Age of Onset   Colon cancer Brother 8   Hypertension Mother    Hypothyroidism Mother    Heart disease Father    Diabetes Father    Hyperlipidemia Father    Clotting disorder Brother    Diabetes Brother    Heart disease Brother     Past Surgical History:  Procedure Laterality Date   ABDOMINAL HYSTERECTOMY     AMPUTATION Right 07/08/2018   Procedure: RIGHT BELOW KNEE AMPUTATION;  Surgeon: [name], MD;  Location: [facility];  Service: Orthopedics;  Laterality: Right;   CESAREAN SECTION     FEMORAL BYPASS     x 5   LUMBAR DISC SURGERY     L4-L5   STUMP REVISION Right 01/24/2021   Procedure: right below the knee amputation revision;  Surgeon: [name], MD;  Location: [facility];  Service: Orthopedics;  Laterality: Right;   TOE AMPUTATION      right   TONSILLECTOMY     Social History   Occupational History   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

## 2021-05-20 ENCOUNTER — Telehealth: Payer: Self-pay | Admitting: Hematology and Oncology

## 2021-05-20 NOTE — Telephone Encounter (Signed)
Scheduled per sch msg. Called and left msg  

## 2021-06-02 ENCOUNTER — Other Ambulatory Visit: Payer: Self-pay | Admitting: Family Medicine

## 2021-06-02 DIAGNOSIS — E1152 Type 2 diabetes mellitus with diabetic peripheral angiopathy with gangrene: Secondary | ICD-10-CM

## 2021-06-11 ENCOUNTER — Telehealth: Payer: Self-pay | Admitting: Family Medicine

## 2021-06-11 ENCOUNTER — Telehealth (INDEPENDENT_AMBULATORY_CARE_PROVIDER_SITE_OTHER): Payer: Medicare HMO | Admitting: Family Medicine

## 2021-06-11 DIAGNOSIS — Z7901 Long term (current) use of anticoagulants: Secondary | ICD-10-CM | POA: Diagnosis not present

## 2021-06-11 DIAGNOSIS — Z89511 Acquired absence of right leg below knee: Secondary | ICD-10-CM

## 2021-06-11 DIAGNOSIS — T8740 Infection of amputation stump, unspecified extremity: Secondary | ICD-10-CM

## 2021-06-11 DIAGNOSIS — T8743 Infection of amputation stump, right lower extremity: Secondary | ICD-10-CM

## 2021-06-11 DIAGNOSIS — E119 Type 2 diabetes mellitus without complications: Secondary | ICD-10-CM | POA: Diagnosis not present

## 2021-06-11 DIAGNOSIS — J449 Chronic obstructive pulmonary disease, unspecified: Secondary | ICD-10-CM | POA: Diagnosis not present

## 2021-06-11 MED ORDER — DOXYCYCLINE HYCLATE 100 MG PO TABS
100.0000 mg | ORAL_TABLET | Freq: Two times a day (BID) | ORAL | 0 refills | Status: AC
Start: 2021-06-11 — End: 2021-06-21

## 2021-06-11 NOTE — Progress Notes (Signed)
MyChart Video visit  Subjective: [id number] stump PCP: Janora Norlander, DO [id number] [name] is a 61 y.o. female. Patient provides verbal consent for consult held via video.  Due to COVID-19 pandemic this visit was conducted virtually. This visit type was conducted due to national recommendations for restrictions regarding the COVID-19 Pandemic (e.g. social distancing, sheltering in place) in an effort to limit this patient's exposure and mitigate transmission in our community. All issues noted in this document were discussed and addressed.  A physical exam was not performed with this format.   Location of patient: home Location of provider: [facility] Others present for call: friedn  1.  Infected stump Reports a 2 day history of purulent drainage from her stump.  This occurred previously and required surgical intervention with Dr. Sharol Given.  She has since been released from care.  He told her that when it had a scab form over that she should plug it off and that is when she just started seeing the purulent drainage again.  She reports very mild pain.  Mild erythema.  No fevers reported.  Additionally, she is on chronic anticoagulation and she wonders if the Xarelto may be causing her some unwanted side effects.  She apparently read several side effects online and she felt that she had these as well.  She wants to see Dr. Donnetta Hutching again as he is the one that put her on the anticoagulant, though she reports she is not sure that she really needs to be on 1.   ROS: Per HPI  Allergies  Allergen Reactions   Aleve [Naproxen Sodium] Hives and Itching   Cortisone Swelling    Internal organs   Prednisone Swelling    Makes internal organs swell   Statins Other (See Comments)    Myalgia even with Pravastatin   Vancomycin Itching   Past Medical History:  Diagnosis Date   Arthritis    COPD (chronic obstructive pulmonary disease) (HCC)    Depression    DM (diabetes mellitus) (HCC)    DVT (deep venous  thrombosis) (HCC)    x5   Family history of colon cancer    GERD (gastroesophageal reflux disease)    Hyperlipemia    Hypertension    Hypothyroid    Obesity    Osteomyelitis of great toe of right foot ([place]) 07/04/2018   PVD (peripheral vascular disease) ([place])    Unilateral complete BKA, right, initial encounter ([place]) 07/11/2018   Unilateral complete BKA, right, subsequent encounter ([place])    Uterine cancer ([place])    UTI (urinary tract infection)     Current Outpatient Medications:    Accu-Chek Softclix Lancets lancets, Use as instructed to test BGs 1-2 times daily E11.52, Disp: 100 each, Rfl: 12   ascorbic acid (VITAMIN C) 500 MG tablet, Take 500 mg by mouth daily., Disp: , Rfl:    aspirin EC 81 MG EC tablet, Take 1 tablet (81 mg total) by mouth daily., Disp: , Rfl:    Cholecalciferol (VITAMIN D3) 5000 units TABS, Take 5,000 Units by mouth daily., Disp: , Rfl:    clopidogrel (PLAVIX) 75 MG tablet, TAKE 1 TABLET DAILY, Disp: 90 tablet, Rfl: 0   diclofenac Sodium (VOLTAREN) 1 % GEL, Apply 2 g topically 4 (four) times daily. Prn pain (Patient taking differently: Apply 2 g topically 4 (four) times daily as needed (pain.).), Disp: 300 g, Rfl: 1   escitalopram (LEXAPRO) 10 MG tablet, TAKE 1 TABLET DAILY, Disp: 90 tablet, Rfl: 0   ezetimibe (ZETIA)  10 MG tablet, TAKE 1 TABLET DAILY, Disp: 90 tablet, Rfl: 0   furosemide (LASIX) 20 MG tablet, TAKE 1 TABLET DAILY, Disp: 90 tablet, Rfl: 0   gabapentin (NEURONTIN) 300 MG capsule, TAKE 1 CAPSULE AT BEDTIME, Disp: 30 capsule, Rfl: 2   glimepiride (AMARYL) 4 MG tablet, TAKE ONE TABLET BY MOUTH EVERY DAY WITH BREAKFAST, Disp: 30 tablet, Rfl: 2   glucose blood test strip, accucheck E11.52; Test BGs 1-2 times daily as directed, Disp: 100 each, Rfl: 12   levothyroxine (SYNTHROID) 100 MCG tablet, Take 1 tablet (100 mcg total) by mouth daily before breakfast., Disp: 90 tablet, Rfl: 3   losartan (COZAAR) 50 MG tablet, TAKE ONE TABLET BY MOUTH EVERY DAY, Disp:  90 tablet, Rfl: 1   metFORMIN (GLUCOPHAGE-XR) 500 MG 24 hr tablet, TAKE TWO TABLETS IN THE MORNING AND AT BEDTIME, Disp: 360 tablet, Rfl: 0   pantoprazole (PROTONIX) 40 MG tablet, TAKE 1 TABLET DAILY, Disp: 90 tablet, Rfl: 0   Potassium 99 MG TABS, Take 99 mg by mouth in the morning., Disp: , Rfl:    rivaroxaban (XARELTO) 20 MG TABS tablet, Take 1 tablet (20 mg total) by mouth daily with supper. (Patient taking differently: Take 20 mg by mouth in the morning.), Disp: 90 tablet, Rfl: 3   sennosides-docusate sodium (SENOKOT-S) 8.6-50 MG tablet, Take 1 tablet by mouth in the morning., Disp: , Rfl:    Tiotropium Bromide Monohydrate (SPIRIVA RESPIMAT) 2.5 MCG/ACT AERS, Inhale 2 puffs into the lungs daily. (Patient taking differently: Inhale 2 puffs into the lungs 3 (three) times a week.), Disp: 8 g, Rfl: 0   tretinoin (RETIN-A) 0.05 % cream, Apply topically at bedtime. (Patient taking differently: Apply 1 application topically daily.), Disp: 45 g, Rfl: 3   triamcinolone cream (KENALOG) 0.1 %, Apply topically 2 (two) times daily for up to 10 days per flare (Patient taking differently: Apply 1 application topically 2 (two) times daily as needed (skin rash/irritation.).), Disp: 30 g, Rfl: 0  Skin: Mild hyperemia appreciated.  No active drainage appreciated at this stump on the right.  Assessment/ Plan: 61 y.o. female   Infection of amputation stump ([place]) - Plan: doxycycline (VIBRA-TABS) 100 MG tablet  S/P unilateral BKA (below knee amputation), right ([place]) - Plan: Ambulatory referral to Vascular Surgery  Chronic anticoagulation  She is had very similar in the past that is responded to doxycycline.  I have renewed this medication for her and placed a referral back to her vascular surgeon for further discussion about her anticoagulation.  We discussed red flag signs and symptoms warranting further evaluation and emergent evaluation.  She voiced good understanding.  Referral back to Dr. Sharol Given pending her  response to the antibiotic but will CC him on this chart  Start time: 12:37pm End time: 12:48pm  Total time spent on patient care (including video visit/ documentation): 11 minutes  [place], [place] [phone]

## 2021-06-11 NOTE — Telephone Encounter (Signed)
Double book one of my televisits and let her know we will take care of this during lunch

## 2021-06-11 NOTE — Telephone Encounter (Signed)
Pt called and appt  made 

## 2021-06-13 ENCOUNTER — Ambulatory Visit (INDEPENDENT_AMBULATORY_CARE_PROVIDER_SITE_OTHER): Payer: Medicare HMO

## 2021-06-13 VITALS — Ht 70.0 in | Wt 240.0 lb

## 2021-06-13 DIAGNOSIS — Z0000 Encounter for general adult medical examination without abnormal findings: Secondary | ICD-10-CM

## 2021-06-13 NOTE — Progress Notes (Signed)
Subjective:   [name] is a 61 y.o. female who presents for Medicare Annual (Subsequent) preventive examination.  Virtual Visit via Telephone Note  I connected with  [name] on 06/13/21 at  1:15 PM EST by telephone and verified that I am speaking with the correct person using two identifiers.  Location: Patient: Home Provider: [facility] Persons participating in the virtual visit: patient/Nurse Health Advisor   I discussed the limitations, risks, security and privacy concerns of performing an evaluation and management service by telephone and the availability of in person appointments. The patient expressed understanding and agreed to proceed.  Interactive audio and video telecommunications were attempted between this nurse and patient, however failed, due to patient having technical difficulties OR patient did not have access to video capability.  We continued and completed visit with audio only.  Some vital signs may be absent or patient reported.   Navina Wohlers E Miquel Stacks, LPN   Review of Systems     Cardiac Risk Factors include: smoking/ tobacco exposure;sedentary lifestyle;obesity (BMI >30kg/m2);diabetes mellitus;dyslipidemia;hypertension;Other (see comment), Risk factor comments: hx of DVT, COPD     Objective:    Today's Vitals   06/13/21 1319 06/13/21 1320  Weight: 240 lb (108.9 kg)   Height: 5\' 10"  (1.778 m)   PainSc:  4    Body mass index is 34.44 kg/m.  Advanced Directives 06/13/2021 01/24/2021 10/09/2020 08/09/2020 07/19/2020 07/19/2020 06/12/2020  Does Patient Have a Medical Advance Directive? No No No No No No No  Would patient like information on creating a medical advance directive? Yes (MAU/Ambulatory/Procedural Areas - Information given) No - Patient declined - No - Patient declined No - Patient declined No - Patient declined No - Patient declined    Current Medications (verified) Outpatient Encounter Medications as of 06/13/2021  Medication Sig   Accu-Chek Softclix  Lancets lancets Use as instructed to test BGs 1-2 times daily E11.52   ascorbic acid (VITAMIN C) 500 MG tablet Take 500 mg by mouth daily.   aspirin EC 81 MG EC tablet Take 1 tablet (81 mg total) by mouth daily.   Cholecalciferol (VITAMIN D3) 5000 units TABS Take 5,000 Units by mouth daily.   clopidogrel (PLAVIX) 75 MG tablet TAKE 1 TABLET DAILY   diclofenac Sodium (VOLTAREN) 1 % GEL Apply 2 g topically 4 (four) times daily. Prn pain (Patient taking differently: Apply 2 g topically 4 (four) times daily as needed (pain.).)   doxycycline (VIBRA-TABS) 100 MG tablet Take 1 tablet (100 mg total) by mouth 2 (two) times daily for 10 days.   escitalopram (LEXAPRO) 10 MG tablet TAKE 1 TABLET DAILY   ezetimibe (ZETIA) 10 MG tablet TAKE 1 TABLET DAILY   furosemide (LASIX) 20 MG tablet TAKE 1 TABLET DAILY   glimepiride (AMARYL) 4 MG tablet TAKE ONE TABLET BY MOUTH EVERY DAY WITH BREAKFAST   glucose blood test strip accucheck E11.52; Test BGs 1-2 times daily as directed   levothyroxine (SYNTHROID) 100 MCG tablet Take 1 tablet (100 mcg total) by mouth daily before breakfast.   losartan (COZAAR) 50 MG tablet TAKE ONE TABLET BY MOUTH EVERY DAY   metFORMIN (GLUCOPHAGE-XR) 500 MG 24 hr tablet TAKE TWO TABLETS IN THE MORNING AND AT BEDTIME   pantoprazole (PROTONIX) 40 MG tablet TAKE 1 TABLET DAILY   Potassium 99 MG TABS Take 99 mg by mouth in the morning.   rivaroxaban (XARELTO) 20 MG TABS tablet Take 1 tablet (20 mg total) by mouth daily with supper. (Patient taking differently:  Take 20 mg by mouth in the morning.)   sennosides-docusate sodium (SENOKOT-S) 8.6-50 MG tablet Take 1 tablet by mouth in the morning.   Tiotropium Bromide Monohydrate (SPIRIVA RESPIMAT) 2.5 MCG/ACT AERS Inhale 2 puffs into the lungs daily. (Patient taking differently: Inhale 2 puffs into the lungs 3 (three) times a week.)   tretinoin (RETIN-A) 0.05 % cream Apply topically at bedtime. (Patient taking differently: Apply 1 application  topically daily.)   triamcinolone cream (KENALOG) 0.1 % Apply topically 2 (two) times daily for up to 10 days per flare (Patient taking differently: Apply 1 application topically 2 (two) times daily as needed (skin rash/irritation.).)   gabapentin (NEURONTIN) 300 MG capsule TAKE 1 CAPSULE AT BEDTIME (Patient not taking: Reported on 06/13/2021)   No facility-administered encounter medications on file as of 06/13/2021.    Allergies (verified) Aleve [naproxen sodium], Cortisone, Prednisone, Statins, and Vancomycin   History: Past Medical History:  Diagnosis Date   Arthritis    COPD (chronic obstructive pulmonary disease) (HCC)    Depression    DM (diabetes mellitus) (HCC)    DVT (deep venous thrombosis) (HCC)    x5   Family history of colon cancer    GERD (gastroesophageal reflux disease)    Hyperlipemia    Hypertension    Hypothyroid    Obesity    Osteomyelitis of great toe of right foot ([place]) 07/04/2018   PVD (peripheral vascular disease) ([place])    Unilateral complete BKA, right, initial encounter ([place]) 07/11/2018   Unilateral complete BKA, right, subsequent encounter ([place])    Uterine cancer ([place])    UTI (urinary tract infection)    Past Surgical History:  Procedure Laterality Date   ABDOMINAL HYSTERECTOMY     AMPUTATION Right 07/08/2018   Procedure: RIGHT BELOW KNEE AMPUTATION;  Surgeon: [name], MD;  Location: [facility];  Service: Orthopedics;  Laterality: Right;   CESAREAN SECTION     FEMORAL BYPASS     x 5   LUMBAR DISC SURGERY     L4-L5   STUMP REVISION Right 01/24/2021   Procedure: right below the knee amputation revision;  Surgeon: [name], MD;  Location: [facility];  Service: Orthopedics;  Laterality: Right;   TOE AMPUTATION     right   TONSILLECTOMY     Family History  Problem Relation Age of Onset   Colon cancer Brother 73   Hypertension Mother    Hypothyroidism Mother    Heart disease Father    Diabetes Father    Hyperlipidemia Father    Clotting  disorder Brother    Diabetes Brother    Heart disease Brother    Social History   Socioeconomic History   Marital status: Divorced    Spouse name: Danny   Number of children: 1   Years of education: 16   Highest education level: Conservator, museum/gallery (e.g., MA, MS, MEng, MEd, MSW, MBA)  Occupational History   Occupation: Product manager: Art gallery manager   Occupation: retired  Tobacco Use   Smoking status: Every Day    Packs/day: 1.00    Years: 38.00    Pack years: 38.00    Types: Cigarettes   Smokeless tobacco: Never  Vaping Use   Vaping Use: Never used  Substance and Sexual Activity   Alcohol use: No    Alcohol/week: 0.0 standard drinks   Drug use: Yes    Frequency: 4.0 times per week    Types: Marijuana   Sexual activity: Not Currently  Birth control/protection: Surgical  Other Topics Concern   Not on file  Social History Narrative   Lives alone in one bedroom apartment - has a ramp outside - no stairs -handicap accessible bathroom   R BKA, but has prosthetic leg she uses to walk, drive, etc   Lots of family nearby, great relationships - lots of help when needed   Social Determinants of Health   Financial Resource Strain: Low Risk    Difficulty of Paying Living Expenses: Not hard at all  Food Insecurity: No Food Insecurity   Worried About Charity fundraiser in the Last Year: Never true   Arboriculturist in the Last Year: Never true  Transportation Needs: No Transportation Needs   Lack of Transportation (Medical): No   Lack of Transportation (Non-Medical): No  Physical Activity: Inactive   Days of Exercise per Week: 0 days   Minutes of Exercise per Session: 0 min  Stress: Not on file  Social Connections: Moderately Integrated   Frequency of Communication with Friends and Family: More than three times a week   Frequency of Social Gatherings with Friends and Family: More than three times a week   Attends Religious Services: 1 to 4 times per year   Active  Member of [employer] or Organizations: Yes   Attends Archivist Meetings: 1 to 4 times per year   Marital Status: Divorced    Tobacco Counseling Ready to quit: Not Answered Counseling given: Not Answered   Clinical Intake:  Pre-visit preparation completed: Yes  Pain : 0-10 Pain Score: 4  Pain Type: Chronic pain, Neuropathic pain Pain Location: Leg Pain Orientation: Right Pain Descriptors / Indicators: Aching, Sore Pain Onset: More than a month ago Pain Frequency: Constant     BMI - recorded: 34.44 Nutritional Status: BMI > 30  Obese Nutritional Risks: Nausea/ vomitting/ diarrhea (hiatal hernia) Diabetes: Yes CBG done?: No Did pt. bring in CBG monitor from home?: No  How often do you need to have someone help you when you read instructions, pamphlets, or other written materials from your doctor or pharmacy?: 1 - Never  Diabetic? Nutrition Risk Assessment:  Has the patient had any N/V/D within the last 2 months?  Yes  Does the patient have any non-healing wounds?  Yes  stump is infected - seeing specialist Has the patient had any unintentional weight loss or weight gain?  No   Diabetes:  Is the patient diabetic?  Yes  If diabetic, was a CBG obtained today?  No  Did the patient bring in their glucometer from home?  No  How often do you monitor your CBG's? never.   Financial Strains and Diabetes Management:  Are you having any financial strains with the device, your supplies or your medication? No .  Does the patient want to be seen by Chronic Care Management for management of their diabetes?  No  Would the patient like to be referred to a Nutritionist or for Diabetic Management?  No   Diabetic Exams:  Diabetic Eye Exam: Overdue for diabetic eye exam. Pt has been advised about the importance in completing this exam. Declined referral to optometrist - says she will make an appt when she has time   Diabetic Foot Exam: Completed 03/26/2020. Pt has been advised  about the importance in completing this exam. Pt is scheduled for diabetic foot exam on 07/08/2021.    Interpreter Needed?: No      Activities of Daily Living In your present  state of health, do you have any difficulty performing the following activities: 06/13/2021 01/24/2021  Hearing? N -  Vision? N -  Difficulty concentrating or making decisions? Y -  Walking or climbing stairs? Y -  Comment BKA -  Dressing or bathing? N -  Doing errands, shopping? Y Y  Comment drives with prosthetic leg, but right now stump is infected so she cannot wear it -  Preparing Food and eating ? N -  Using the Toilet? N -  In the past six months, have you accidently leaked urine? N -  Do you have problems with loss of bowel control? N -  Managing your Medications? Y -  Comment forgetful - has pill pack program - advised to set alarms - she only forgets about once every 2 weeks -  Managing your Finances? N -  Housekeeping or managing your Housekeeping? Y -  Comment she can do some,but pays someone to deep clean twice per week -  Some recent data might be hidden    Patient Care Team: Janora Norlander, DO as PCP - General (Family Medicine) [name], MD as Consulting Physician (Orthopedic Surgery) Celestia Khat, [place] (Optometry)  Indicate any recent Medical Services you may have received from other than [facility] providers in the past year (date may be approximate).     Assessment:   This is a routine wellness examination for [name].  Hearing/Vision screen Hearing Screening - Comments:: Denies hearing difficulties  Vision Screening - Comments:: Wears rx glasses - Behind on annual eye exams with [facility]  Dietary issues and exercise activities discussed: Current Exercise Habits: The patient does not participate in regular exercise at present, Exercise limited by: neurologic condition(s);orthopedic condition(s);respiratory conditions(s)   Goals Addressed               This Visit's  Progress     Increase physical activity (pt-stated)   Not on track     "I want to get to walking again after my BKA of the right leg"      Quit Smoking         Depression Screen PHQ 2/9 Scores 06/13/2021 04/07/2021 12/31/2020 10/01/2020 08/14/2020 07/18/2020 06/26/2020  PHQ - 2 Score 0 0 3 0 0 0 0  PHQ- 9 Score - 0 12 0 0 - 0    Fall Risk Fall Risk  06/13/2021 04/07/2021 10/01/2020 07/18/2020 06/12/2020  Falls in the past year? 1 1 1 1 1   Number falls in past yr: 1 0 0 1 1  Injury with Fall? 0 0 1 0 0  Risk for fall due to : History of fall(s);Impaired mobility;Medication side effect;Orthopedic patient History of fall(s) Impaired balance/gait Impaired balance/gait;Impaired mobility;History of fall(s) History of fall(s)  Follow up Education provided;Falls prevention discussed Education provided Falls evaluation completed Follow up appointment Falls evaluation completed    [place]:  Any stairs in or around the home? No  If so, are there any without handrails? No  Home free of loose throw rugs in walkways, pet beds, electrical cords, etc? Yes  Adequate lighting in your home to reduce risk of falls? Yes   ASSISTIVE DEVICES UTILIZED TO PREVENT FALLS:  Life alert? No  Use of a cane, walker or w/c? Yes  Grab bars in the bathroom? Yes  Shower chair or bench in shower? Yes  Elevated toilet seat or a handicapped toilet? Yes   TIMED UP AND GO:  Was the test performed? No .  Telephonic visit  Cognitive Function:     6CIT Screen 06/13/2021 06/12/2020 11/24/2018  What Year? 0 points 0 points 0 points  What month? 0 points 0 points 0 points  What time? 0 points 0 points 0 points  Count back from 20 0 points 0 points 0 points  Months in reverse 0 points 0 points 0 points  Repeat phrase 0 points 0 points 0 points  Total Score 0 0 0    Immunizations Immunization History  Administered Date(s) Administered   PFIZER(Purple Top)SARS-COV-2 Vaccination 01/25/2020,  02/15/2020   Td 06/08/2005    TDAP status: Due, Education has been provided regarding the importance of this vaccine. Advised may receive this vaccine at local pharmacy or Health Dept. Aware to provide a copy of the vaccination record if obtained from local pharmacy or Health Dept. Verbalized acceptance and understanding.  Flu Vaccine status: Declined, Education has been provided regarding the importance of this vaccine but patient still declined. Advised may receive this vaccine at local pharmacy or Health Dept. Aware to provide a copy of the vaccination record if obtained from local pharmacy or Health Dept. Verbalized acceptance and understanding.  Pneumococcal vaccine status: Declined,  Education has been provided regarding the importance of this vaccine but patient still declined. Advised may receive this vaccine at local pharmacy or Health Dept. Aware to provide a copy of the vaccination record if obtained from local pharmacy or Health Dept. Verbalized acceptance and understanding.   Covid-19 vaccine status: Completed vaccines  Qualifies for Shingles Vaccine? Yes   Zostavax completed No   Shingrix Completed?: No.    Education has been provided regarding the importance of this vaccine. Patient has been advised to call insurance company to determine out of pocket expense if they have not yet received this vaccine. Advised may also receive vaccine at local pharmacy or Health Dept. Verbalized acceptance and understanding.  Screening Tests Health Maintenance  Topic Date Due   OPHTHALMOLOGY EXAM  05/25/2019   FOOT EXAM  03/26/2021   COVID-19 Vaccine (3 - Pfizer risk series) 06/29/2021 (Originally 03/14/2020)   Zoster Vaccines- Shingrix (1 of 2) 07/08/2021 (Originally 07/20/1981)   INFLUENZA VACCINE  09/05/2021 (Originally 01/06/2021)   Pneumococcal Vaccine 38-52 Years old (1 - PCV) 04/07/2022 (Originally 07[birth year]20/1968)   TETANUS/TDAP  04/07/2022 (Originally 06/09/2015)   COLONOSCOPY (Pts 45-22yrs  Insurance coverage will need to be confirmed)  06/13/2022 (Originally 07/01/2017)   HEMOGLOBIN A1C  10/05/2021   MAMMOGRAM  04/24/2023   Hepatitis C Screening  Completed   HIV Screening  Completed   HPV VACCINES  Aged Out   PAP SMEAR-Modifier  Discontinued    Health Maintenance  Health Maintenance Due  Topic Date Due   OPHTHALMOLOGY EXAM  05/25/2019   FOOT EXAM  03/26/2021    Colorectal cancer screening: Referral to GI placed 2022. Pt aware the office will call re: appt.  Mammogram status: Completed 04/23/2021. Repeat every year (repeat in 3 months) has appt  Lung Cancer Screening: (Low Dose CT Chest recommended if Age 62-80 years, 30 pack-year currently smoking OR have quit w/in 15years.) does not qualify.   Additional Screening:  Hepatitis C Screening: does qualify; Completed 03/26/2020  Vision Screening: Recommended annual ophthalmology exams for early detection of glaucoma and other disorders of the eye. Is the patient up to date with their annual eye exam?  No  Who is the provider or what is the name of the office in which the patient attends annual eye exams? [facility] If pt  is not established with a provider, would they like to be referred to a provider to establish care? No .   Dental Screening: Recommended annual dental exams for proper oral hygiene  Community Resource Referral / Chronic Care Management: CRR required this visit?  No   CCM required this visit?  No      Plan:     I have personally reviewed and noted the following in the patients chart:   Medical and social history Use of alcohol, tobacco or illicit drugs  Current medications and supplements including opioid prescriptions.  Functional ability and status Nutritional status Physical activity Advanced directives List of other physicians Hospitalizations, surgeries, and ER visits in previous 12 months Vitals Screenings to include cognitive, depression, and falls Referrals and  appointments  In addition, I have reviewed and discussed with patient certain preventive protocols, quality metrics, and best practice recommendations. A written personalized care plan for preventive services as well as general preventive health recommendations were provided to patient.     Sandrea Hammond, LPN   09/08/3293   Nurse Notes: BKA stump infected right now - waiting for this to heal before scheduling Colonoscopy or vaccines.

## 2021-06-13 NOTE — Patient Instructions (Signed)
[name] , Thank you for taking time to come for your Medicare Wellness Visit. I appreciate your ongoing commitment to your health goals. Please review the following plan we discussed and let me know if I can assist you in the future.   Screening recommendations/referrals: Colonoscopy: Done 07/01/2012 - Repeat in 5 years (plan to make appointment once leg is healed) Mammogram: Done 04/23/2021 - repeat in 3 months (appointment 07/2021) Bone Density: Due at age 50 Recommended yearly ophthalmology/optometry visit for glaucoma screening and checkup Recommended yearly dental visit for hygiene and checkup  Vaccinations: Influenza vaccine: Declined Pneumococcal vaccine: Declined Tdap vaccine: Done 2007 - Repeat in 10 years *due Shingles vaccine: Declined  Covid-19: Done 01/25/2020 & 02/25/2020  Advanced directives: Advance directive discussed with you today. I have provided a copy for you to complete at home and have notarized. Once this is complete please bring a copy in to our office so we can scan it into your chart.   Conditions/risks identified: Aim for 30 minutes of exercise each day - seated exercises, leg lifts, light weight arm exercises, drink 6-8 glasses of water and eat lots of fruits and vegetables.   Next appointment: Follow up in one year for your annual wellness visit.   Preventive Care 40-64 Years, Female Preventive care refers to lifestyle choices and visits with your health care provider that can promote health and wellness. What does preventive care include? A yearly physical exam. This is also called an annual well check. Dental exams once or twice a year. Routine eye exams. Ask your health care provider how often you should have your eyes checked. Personal lifestyle choices, including: Daily care of your teeth and gums. Regular physical activity. Eating a healthy diet. Avoiding tobacco and drug use. Limiting alcohol use. Practicing safe sex. Taking low-dose aspirin  daily starting at age 30. Taking vitamin and mineral supplements as recommended by your health care provider. What happens during an annual well check? The services and screenings done by your health care provider during your annual well check will depend on your age, overall health, lifestyle risk factors, and family history of disease. Counseling  Your health care provider may ask you questions about your: Alcohol use. Tobacco use. Drug use. Emotional well-being. Home and relationship well-being. Sexual activity. Eating habits. Work and work Statistician. Method of birth control. Menstrual cycle. Pregnancy history. Screening  You may have the following tests or measurements: Height, weight, and BMI. Blood pressure. Lipid and cholesterol levels. These may be checked every 5 years, or more frequently if you are over 67 years old. Skin check. Lung cancer screening. You may have this screening every year starting at age 50 if you have a 30-pack-year history of smoking and currently smoke or have quit within the past 15 years. Fecal occult blood test (FOBT) of the stool. You may have this test every year starting at age 51. Flexible sigmoidoscopy or colonoscopy. You may have a sigmoidoscopy every 5 years or a colonoscopy every 10 years starting at age 17. Hepatitis C blood test. Hepatitis B blood test. Sexually transmitted disease (STD) testing. Diabetes screening. This is done by checking your blood sugar (glucose) after you have not eaten for a while (fasting). You may have this done every 1-3 years. Mammogram. This may be done every 1-2 years. Talk to your health care provider about when you should start having regular mammograms. This may depend on whether you have a family history of breast cancer. BRCA-related cancer screening. This may  be done if you have a family history of breast, ovarian, tubal, or peritoneal cancers. Pelvic exam and Pap test. This may be done every 3 years  starting at age 23. Starting at age 26, this may be done every 5 years if you have a Pap test in combination with an HPV test. Bone density scan. This is done to screen for osteoporosis. You may have this scan if you are at high risk for osteoporosis. Discuss your test results, treatment options, and if necessary, the need for more tests with your health care provider. Vaccines  Your health care provider may recommend certain vaccines, such as: Influenza vaccine. This is recommended every year. Tetanus, diphtheria, and acellular pertussis (Tdap, Td) vaccine. You may need a Td booster every 10 years. Zoster vaccine. You may need this after age 37. Pneumococcal 13-valent conjugate (PCV13) vaccine. You may need this if you have certain conditions and were not previously vaccinated. Pneumococcal polysaccharide (PPSV23) vaccine. You may need one or two doses if you smoke cigarettes or if you have certain conditions. Talk to your health care provider about which screenings and vaccines you need and how often you need them. This information is not intended to replace advice given to you by your health care provider. Make sure you discuss any questions you have with your health care provider. Document Released: 06/21/2015 Document Revised: 02/12/2016 Document Reviewed: 03/26/2015 Elsevier Interactive Patient Education  2017 [place] Prevention in the Home Falls can cause injuries. They can happen to people of all ages. There are many things you can do to make your home safe and to help prevent falls. What can I do on the outside of my home? Regularly fix the edges of walkways and driveways and fix any cracks. Remove anything that might make you trip as you walk through a door, such as a raised step or threshold. Trim any bushes or trees on the path to your home. Use bright outdoor lighting. Clear any walking paths of anything that might make someone trip, such as rocks or  tools. Regularly check to see if handrails are loose or broken. Make sure that both sides of any steps have handrails. Any raised decks and porches should have guardrails on the edges. Have any leaves, snow, or ice cleared regularly. Use sand or salt on walking paths during winter. Clean up any spills in your garage right away. This includes oil or grease spills. What can I do in the bathroom? Use night lights. Install grab bars by the toilet and in the tub and shower. Do not use towel bars as grab bars. Use non-skid mats or decals in the tub or shower. If you need to sit down in the shower, use a plastic, non-slip stool. Keep the floor dry. Clean up any water that spills on the floor as soon as it happens. Remove soap buildup in the tub or shower regularly. Attach bath mats securely with double-sided non-slip rug tape. Do not have throw rugs and other things on the floor that can make you trip. What can I do in the bedroom? Use night lights. Make sure that you have a light by your bed that is easy to reach. Do not use any sheets or blankets that are too big for your bed. They should not hang down onto the floor. Have a firm chair that has side arms. You can use this for support while you get dressed. Do not have throw rugs and other things  on the floor that can make you trip. What can I do in the kitchen? Clean up any spills right away. Avoid walking on wet floors. Keep items that you use a lot in easy-to-reach places. If you need to reach something above you, use a strong step stool that has a grab bar. Keep electrical cords out of the way. Do not use floor polish or wax that makes floors slippery. If you must use wax, use non-skid floor wax. Do not have throw rugs and other things on the floor that can make you trip. What can I do with my stairs? Do not leave any items on the stairs. Make sure that there are handrails on both sides of the stairs and use them. Fix handrails that are  broken or loose. Make sure that handrails are as long as the stairways. Check any carpeting to make sure that it is firmly attached to the stairs. Fix any carpet that is loose or worn. Avoid having throw rugs at the top or bottom of the stairs. If you do have throw rugs, attach them to the floor with carpet tape. Make sure that you have a light switch at the top of the stairs and the bottom of the stairs. If you do not have them, ask someone to add them for you. What else can I do to help prevent falls? Wear shoes that: Do not have high heels. Have rubber bottoms. Are comfortable and fit you well. Are closed at the toe. Do not wear sandals. If you use a stepladder: Make sure that it is fully opened. Do not climb a closed stepladder. Make sure that both sides of the stepladder are locked into place. Ask someone to hold it for you, if possible. Clearly mark and make sure that you can see: Any grab bars or handrails. First and last steps. Where the edge of each step is. Use tools that help you move around (mobility aids) if they are needed. These include: Canes. Walkers. Scooters. Crutches. Turn on the lights when you go into a dark area. Replace any light bulbs as soon as they burn out. Set up your furniture so you have a clear path. Avoid moving your furniture around. If any of your floors are uneven, fix them. If there are any pets around you, be aware of where they are. Review your medicines with your doctor. Some medicines can make you feel dizzy. This can increase your chance of falling. Ask your doctor what other things that you can do to help prevent falls. This information is not intended to replace advice given to you by your health care provider. Make sure you discuss any questions you have with your health care provider. Document Released: 03/21/2009 Document Revised: 10/31/2015 Document Reviewed: 06/29/2014 Elsevier Interactive Patient Education  2017 [employer].

## 2021-06-16 ENCOUNTER — Ambulatory Visit: Payer: Medicare HMO | Admitting: Orthopedic Surgery

## 2021-06-16 ENCOUNTER — Encounter: Payer: Self-pay | Admitting: Orthopedic Surgery

## 2021-06-16 VITALS — Ht 70.0 in | Wt 240.0 lb

## 2021-06-16 DIAGNOSIS — T8130XA Disruption of wound, unspecified, initial encounter: Secondary | ICD-10-CM

## 2021-06-16 DIAGNOSIS — Z89511 Acquired absence of right leg below knee: Secondary | ICD-10-CM

## 2021-06-16 MED ORDER — DOXYCYCLINE HYCLATE 100 MG PO TABS: 100.0000 mg | ORAL_TABLET | Freq: Two times a day (BID) | ORAL | 0 refills | Status: DC

## 2021-06-16 NOTE — Progress Notes (Signed)
Office Visit Note   Patient: [name]           Date of Birth: 07/07/1960           MRN: [id number] Visit Date: 06/16/2021              Requested by: Janora Norlander, DO [place],  [place] [address] PCP: Janora Norlander, DO  Chief Complaint  Patient presents with   Right Leg - Wound Check    01/24/2021 right BKA revision      HPI: Patient is a 61 year old woman who is status post right transtibial amputation revision back in August approximately 5 months ago.  Patient has had a recurrent breakdown ulcer on the medial aspect of the residual limb with some drainage.  She is currently on doxycycline.  Assessment & Plan: Visit Diagnoses:  1. Acquired absence of right leg below knee ([place])   2. Wound dehiscence     Plan: A refill prescription was called in for doxycycline.  I feel like this is mostly coming from granulation tissue from in the same area where she has had multiple vascular surgeries as well as now transtibial amputation.  With her blood thinner I think this tissue is just not durable enough for weightbearing.  She will discuss with Dr. Donnetta Hutching if she can come off her blood thinners.  Follow-Up Instructions: Return in about 2 weeks (around 06/30/2021).   Ortho Exam  Patient is alert, oriented, no adenopathy, well-dressed, normal affect, normal respiratory effort. Examination the residual limb is well consolidated there is no cellulitis there is a small wound medially that is 1 mm in diameter and 10 mm deep.  There is no tenderness to palpation no cellulitis no purulent drainage there is clear serosanguineous drainage.  Imaging: No results found. No images are attached to the encounter.  Labs: Lab Results  Component Value Date   HGBA1C 7.5 (H) 04/07/2021   HGBA1C 8.3 (H) 01/24/2021   HGBA1C 7.5 (H) 12/31/2020   ESRSEDRATE 19 10/25/2012   CRP 0.6 10/25/2012   LABURIC 5.7 12/10/2006   REPTSTATUS 07/09/2018 FINAL 07/04/2018   CULT NO GROWTH  5 DAYS 07/04/2018     Lab Results  Component Value Date   ALBUMIN 3.6 07/19/2020   ALBUMIN 3.7 (L) 06/26/2020   ALBUMIN 3.9 12/27/2019    Lab Results  Component Value Date   MG 1.7 07/08/2018   Lab Results  Component Value Date   VD25OH 67.6 03/29/2019   VD25OH 72.2 12/27/2018   VD25OH 56.8 11/09/2018    No results found for: PREALBUMIN CBC EXTENDED Latest Ref Rng & Units 01/24/2021 07/19/2020 06/26/2020  WBC 4.0 - 10.5 K/uL 15.0(H) 10.5 11.5(H)  RBC 3.87 - 5.11 MIL/uL 5.28(H) 5.35(H) 5.08  HGB 12.0 - 15.0 g/dL 15.1(H) 15.5(H) 14.7  HCT 36.0 - 46.0 % 46.2(H) 47.7(H) 44.0  PLT 150 - 400 K/uL 263 267 273  NEUTROABS 1.7 - 7.7 K/uL - 5.3 -  LYMPHSABS 0.7 - 4.0 K/uL - 3.9 -     Body mass index is 34.44 kg/m.  Orders:  No orders of the defined types were placed in this encounter.  Meds ordered this encounter  Medications   doxycycline (VIBRA-TABS) 100 MG tablet    Sig: Take 1 tablet (100 mg total) by mouth 2 (two) times daily.    Dispense:  60 tablet    Refill:  0     Procedures: No procedures performed  Clinical Data: No additional  findings.  ROS:  All other systems negative, except as noted in the HPI. Review of Systems  Objective: Vital Signs: Ht 5\' 10"  (1.778 m)    Wt 240 lb (108.9 kg)    BMI 34.44 kg/m   Specialty Comments:  No specialty comments available.  PMFS History: Patient Active Problem List   Diagnosis Date Noted   Wound dehiscence 01/24/2021   Dehiscence of amputation stump ([place])    Abscess 01/07/2021   History of DVT (deep vein thrombosis) 07/03/2019   Statin intolerance 04/19/2019   Vitamin D deficiency 12/27/2018   Type 2 diabetes mellitus with diabetic peripheral angiopathy and gangrene, without long-term current use of insulin ([place]) 12/27/2018   DVT of axillary vein, chronic, bilateral ([place]) 12/27/2018   Hypertension associated with chronic kidney disease due to type 2 diabetes mellitus ([place]) 12/27/2018   Long term current use  of anticoagulant 12/27/2018   Medically noncompliant 12/27/2018   S/P unilateral BKA (below knee amputation), right (HCC)    Subtherapeutic international normalized ratio (INR)    Noncompliance    Leukocytosis    Acute blood loss anemia    Diabetes mellitus type 2 in obese [facility])    Acquired absence of right leg below knee ([place]) 07/08/2018   Mild protein-calorie malnutrition (HCC)    Intermittent claudication ([place]) 05/11/2017   Deep vein thrombosis (DVT) of both lower extremities ([place]) 01/05/2017   Peripheral vascular insufficiency ([place]) 09/25/2015   Hypothyroidism 01/29/2014   Obesity (BMI 30-39.9) 08/16/2013   Impetigo 07/28/2013   Chronic obstructive pulmonary disease ([place])    Malignant neoplasm of uterus ([place])    GERD (gastroesophageal reflux disease) 10/18/2012   Hernia, hiatal 10/18/2012   Hyperlipidemia associated with type 2 diabetes mellitus ([place]) 04/18/2008   SMOKER 04/18/2008   Past Medical History:  Diagnosis Date   Arthritis    COPD (chronic obstructive pulmonary disease) (HCC)    Depression    DM (diabetes mellitus) (HCC)    DVT (deep venous thrombosis) (HCC)    x5   Family history of colon cancer    GERD (gastroesophageal reflux disease)    Hyperlipemia    Hypertension    Hypothyroid    Obesity    Osteomyelitis of great toe of right foot ([place]) 07/04/2018   PVD (peripheral vascular disease) (HCC)    Unilateral complete BKA, right, initial encounter ([place]) 07/11/2018   Unilateral complete BKA, right, subsequent encounter ([place])    Uterine cancer ([place])    UTI (urinary tract infection)     Family History  Problem Relation Age of Onset   Colon cancer Brother 72   Hypertension Mother    Hypothyroidism Mother    Heart disease Father    Diabetes Father    Hyperlipidemia Father    Clotting disorder Brother    Diabetes Brother    Heart disease Brother     Past Surgical History:  Procedure Laterality Date   ABDOMINAL HYSTERECTOMY     AMPUTATION Right  07/08/2018   Procedure: RIGHT BELOW KNEE AMPUTATION;  Surgeon: [name], MD;  Location: [facility];  Service: Orthopedics;  Laterality: Right;   CESAREAN SECTION     FEMORAL BYPASS     x 5   LUMBAR DISC SURGERY     L4-L5   STUMP REVISION Right 01/24/2021   Procedure: right below the knee amputation revision;  Surgeon: [name], MD;  Location: [facility];  Service: Orthopedics;  Laterality: Right;   TOE AMPUTATION     right  TONSILLECTOMY     Social History   Occupational History   Occupation: Product manager: Art gallery manager   Occupation: retired  Tobacco Use   Smoking status: Every Day    Packs/day: 1.00    Years: 38.00    Pack years: 38.00    Types: Cigarettes   Smokeless tobacco: Never  Vaping Use   Vaping Use: Never used  Substance and Sexual Activity   Alcohol use: No    Alcohol/week: 0.0 standard drinks   Drug use: Yes    Frequency: 4.0 times per week    Types: Marijuana   Sexual activity: Not Currently    Birth control/protection: Surgical

## 2021-06-17 ENCOUNTER — Other Ambulatory Visit: Payer: Self-pay | Admitting: Family Medicine

## 2021-06-18 ENCOUNTER — Telehealth: Payer: Self-pay | Admitting: Hematology and Oncology

## 2021-06-18 NOTE — Telephone Encounter (Signed)
Returned call per 1/10 inbasket, left msg

## 2021-06-23 ENCOUNTER — Ambulatory Visit: Payer: Medicare HMO | Admitting: Hematology and Oncology

## 2021-07-01 ENCOUNTER — Other Ambulatory Visit: Payer: Self-pay | Admitting: Family Medicine

## 2021-07-01 DIAGNOSIS — E1165 Type 2 diabetes mellitus with hyperglycemia: Secondary | ICD-10-CM

## 2021-07-02 ENCOUNTER — Ambulatory Visit (INDEPENDENT_AMBULATORY_CARE_PROVIDER_SITE_OTHER): Payer: Medicare HMO | Admitting: Family

## 2021-07-02 ENCOUNTER — Other Ambulatory Visit: Payer: Self-pay

## 2021-07-02 ENCOUNTER — Encounter: Payer: Self-pay | Admitting: Family

## 2021-07-02 DIAGNOSIS — Z89511 Acquired absence of right leg below knee: Secondary | ICD-10-CM | POA: Diagnosis not present

## 2021-07-02 DIAGNOSIS — S88111S Complete traumatic amputation at level between knee and ankle, right lower leg, sequela: Secondary | ICD-10-CM

## 2021-07-02 MED ORDER — NITROGLYCERIN 0.2 MG/HR TD PT24: 0.2000 mg | MEDICATED_PATCH | Freq: Every day | TRANSDERMAL | 12 refills | Status: DC

## 2021-07-02 NOTE — Progress Notes (Signed)
Office Visit Note   Patient: [name]           Date of Birth: Sep 02, 1960           MRN: [id number] Visit Date: 07/02/2021              Requested by: Janora Norlander, DO [place],  [place] [address] PCP: Janora Norlander, DO  Chief Complaint  Patient presents with   Right Leg - Follow-up    01/24/2021 right BKA       HPI: Patient is a 61 year old woman who is status post right transtibial amputation revision back in August 2022.  Patient has had a recurrent breakdown ulcer on the medial aspect of the residual limb with some drainage, this is close to her revascularization scar.  She is currently on doxycycline, has completed 3 weeks.  She continues to complain of drainage that she describes as milky and thick, she was milking her wound earlier today and was able to express drainage  Denies fevers chills or feeling poorly  Assessment & Plan: Visit Diagnoses:  1. Below-knee amputation of right lower extremity with complication, sequela ([place])     Plan: She states she is still trying to get an appointment with Dr. Donnetta Hutching.  She will continue with her doxycycline have discussed packing the wound open with mupirocin and gauze.  Have sent a wound culture today. Will also have her start using a nitro patch around the wound, rotating sites.  Follow-Up Instructions: No follow-ups on file.   Ortho Exam  Patient is alert, oriented, no adenopathy, well-dressed, normal affect, normal respiratory effort. Examination the residual limb is well consolidated there is no cellulitis there is a small wound medially that is 2 mm in diameter and 3 mm deep.  There is no tenderness to palpation no cellulitis. serosanguineous drainage today 2 drops   Imaging: No results found. No images are attached to the encounter.  Labs: Lab Results  Component Value Date   HGBA1C 7.5 (H) 04/07/2021   HGBA1C 8.3 (H) 01/24/2021   HGBA1C 7.5 (H) 12/31/2020   ESRSEDRATE 19 10/25/2012    CRP 0.6 10/25/2012   LABURIC 5.7 12/10/2006   REPTSTATUS 07/09/2018 FINAL 07/04/2018   CULT NO GROWTH 5 DAYS 07/04/2018     Lab Results  Component Value Date   ALBUMIN 3.6 07/19/2020   ALBUMIN 3.7 (L) 06/26/2020   ALBUMIN 3.9 12/27/2019    Lab Results  Component Value Date   MG 1.7 07/08/2018   Lab Results  Component Value Date   VD25OH 67.6 03/29/2019   VD25OH 72.2 12/27/2018   VD25OH 56.8 11/09/2018    No results found for: PREALBUMIN CBC EXTENDED Latest Ref Rng & Units 01/24/2021 07/19/2020 06/26/2020  WBC 4.0 - 10.5 K/uL 15.0(H) 10.5 11.5(H)  RBC 3.87 - 5.11 MIL/uL 5.28(H) 5.35(H) 5.08  HGB 12.0 - 15.0 g/dL 15.1(H) 15.5(H) 14.7  HCT 36.0 - 46.0 % 46.2(H) 47.7(H) 44.0  PLT 150 - 400 K/uL 263 267 273  NEUTROABS 1.7 - 7.7 K/uL - 5.3 -  LYMPHSABS 0.7 - 4.0 K/uL - 3.9 -     There is no height or weight on file to calculate BMI.  Orders:  Orders Placed This Encounter  Procedures   Wound culture    Meds ordered this encounter  Medications   nitroGLYCERIN (NITRODUR - DOSED IN MG/24 HR) 0.2 mg/hr patch    Sig: Place 1 patch (0.2 mg total) onto the skin daily.  Dispense:  30 patch    Refill:  12     Procedures: No procedures performed  Clinical Data: No additional findings.  ROS:  All other systems negative, except as noted in the HPI. Review of Systems  Objective: Vital Signs: There were no vitals taken for this visit.  Specialty Comments:  No specialty comments available.  PMFS History: Patient Active Problem List   Diagnosis Date Noted   Wound dehiscence 01/24/2021   Dehiscence of amputation stump ([place])    Abscess 01/07/2021   History of DVT (deep vein thrombosis) 07/03/2019   Statin intolerance 04/19/2019   Vitamin D deficiency 12/27/2018   Type 2 diabetes mellitus with diabetic peripheral angiopathy and gangrene, without long-term current use of insulin ([place]) 12/27/2018   DVT of axillary vein, chronic, bilateral ([place]) 12/27/2018    Hypertension associated with chronic kidney disease due to type 2 diabetes mellitus ([place]) 12/27/2018   Long term current use of anticoagulant 12/27/2018   Medically noncompliant 12/27/2018   S/P unilateral BKA (below knee amputation), right (HCC)    Subtherapeutic international normalized ratio (INR)    Noncompliance    Leukocytosis    Acute blood loss anemia    Diabetes mellitus type 2 in obese [facility])    Acquired absence of right leg below knee ([place]) 07/08/2018   Mild protein-calorie malnutrition (HCC)    Intermittent claudication ([place]) 05/11/2017   Deep vein thrombosis (DVT) of both lower extremities ([place]) 01/05/2017   Peripheral vascular insufficiency ([place]) 09/25/2015   Hypothyroidism 01/29/2014   Obesity (BMI 30-39.9) 08/16/2013   Impetigo 07/28/2013   Chronic obstructive pulmonary disease ([place])    Malignant neoplasm of uterus ([place])    GERD (gastroesophageal reflux disease) 10/18/2012   Hernia, hiatal 10/18/2012   Hyperlipidemia associated with type 2 diabetes mellitus ([place]) 04/18/2008   SMOKER 04/18/2008   Past Medical History:  Diagnosis Date   Arthritis    COPD (chronic obstructive pulmonary disease) (HCC)    Depression    DM (diabetes mellitus) (HCC)    DVT (deep venous thrombosis) (HCC)    x5   Family history of colon cancer    GERD (gastroesophageal reflux disease)    Hyperlipemia    Hypertension    Hypothyroid    Obesity    Osteomyelitis of great toe of right foot ([place]) 07/04/2018   PVD (peripheral vascular disease) (HCC)    Unilateral complete BKA, right, initial encounter ([place]) 07/11/2018   Unilateral complete BKA, right, subsequent encounter ([place])    Uterine cancer ([place])    UTI (urinary tract infection)     Family History  Problem Relation Age of Onset   Colon cancer Brother 27   Hypertension Mother    Hypothyroidism Mother    Heart disease Father    Diabetes Father    Hyperlipidemia Father    Clotting disorder Brother    Diabetes Brother    Heart  disease Brother     Past Surgical History:  Procedure Laterality Date   ABDOMINAL HYSTERECTOMY     AMPUTATION Right 07/08/2018   Procedure: RIGHT BELOW KNEE AMPUTATION;  Surgeon: [name], MD;  Location: [facility];  Service: Orthopedics;  Laterality: Right;   CESAREAN SECTION     FEMORAL BYPASS     x 5   LUMBAR DISC SURGERY     L4-L5   STUMP REVISION Right 01/24/2021   Procedure: right below the knee amputation revision;  Surgeon: [name], MD;  Location: [facility];  Service: Orthopedics;  Laterality: Right;   TOE AMPUTATION     right   TONSILLECTOMY     Social History   Occupational History   Occupation: Product manager: Art gallery manager   Occupation: retired  Tobacco Use   Smoking status: Every Day    Packs/day: 1.00    Years: 38.00    Pack years: 38.00    Types: Cigarettes   Smokeless tobacco: Never  Vaping Use   Vaping Use: Never used  Substance and Sexual Activity   Alcohol use: No    Alcohol/week: 0.0 standard drinks   Drug use: Yes    Frequency: 4.0 times per week    Types: Marijuana   Sexual activity: Not Currently    Birth control/protection: Surgical

## 2021-07-04 ENCOUNTER — Other Ambulatory Visit: Payer: Self-pay | Admitting: Family Medicine

## 2021-07-06 LAB — WOUND CULTURE
MICRO NUMBER:: 12918104
RESULT:: NO GROWTH
SPECIMEN QUALITY:: ADEQUATE

## 2021-07-08 ENCOUNTER — Ambulatory Visit (INDEPENDENT_AMBULATORY_CARE_PROVIDER_SITE_OTHER): Payer: Medicare HMO | Admitting: Family Medicine

## 2021-07-08 ENCOUNTER — Encounter: Payer: Self-pay | Admitting: Family Medicine

## 2021-07-08 VITALS — BP 145/60 | HR 103 | Temp 97.0°F | Ht 70.0 in | Wt 242.0 lb

## 2021-07-08 DIAGNOSIS — E1169 Type 2 diabetes mellitus with other specified complication: Secondary | ICD-10-CM

## 2021-07-08 DIAGNOSIS — J449 Chronic obstructive pulmonary disease, unspecified: Secondary | ICD-10-CM | POA: Diagnosis not present

## 2021-07-08 DIAGNOSIS — E785 Hyperlipidemia, unspecified: Secondary | ICD-10-CM

## 2021-07-08 DIAGNOSIS — E1122 Type 2 diabetes mellitus with diabetic chronic kidney disease: Secondary | ICD-10-CM

## 2021-07-08 DIAGNOSIS — E039 Hypothyroidism, unspecified: Secondary | ICD-10-CM | POA: Diagnosis not present

## 2021-07-08 DIAGNOSIS — I129 Hypertensive chronic kidney disease with stage 1 through stage 4 chronic kidney disease, or unspecified chronic kidney disease: Secondary | ICD-10-CM | POA: Diagnosis not present

## 2021-07-08 DIAGNOSIS — E1152 Type 2 diabetes mellitus with diabetic peripheral angiopathy with gangrene: Secondary | ICD-10-CM

## 2021-07-08 DIAGNOSIS — Z89511 Acquired absence of right leg below knee: Secondary | ICD-10-CM | POA: Diagnosis not present

## 2021-07-08 LAB — BAYER DCA HB A1C WAIVED: HB A1C (BAYER DCA - WAIVED): 9.2 % — ABNORMAL HIGH (ref 4.8–5.6)

## 2021-07-08 MED ORDER — OZEMPIC (0.25 OR 0.5 MG/DOSE) 2 MG/1.5ML ~~LOC~~ SOPN: 0.2500 mg | PEN_INJECTOR | SUBCUTANEOUS | 0 refills | Status: DC

## 2021-07-08 NOTE — Progress Notes (Signed)
Subjective: CC:DM PCP: Janora Norlander, DO MEQ:ASTMHD [name] is a 61 y.o. female presenting to clinic today for:  1. Type 2 Diabetes with hypertension, hyperlipidemia history of right BKA:  Since her last visit, patient has been seen by her orthopedist.  She is status post 3 weeks of doxycycline due to some ongoing residual limb drainage.  Her specialists are continuing her on the doxycycline with packing and mupirocin.  Wound culture was sent and was negative.  They have started her on Nitropatch around the wound to try and promote healing.  She is also to be seeing vascular surgery tomorrow.  At last visit she noted to have uncontrolled blood sugar with A1c up to 7.5.  She wants to pursue lifestyle modification and weight to see how her blood sugars responded to recent infections and amputation.  We were planning on adding Tradjenta today if BGs remain uncontrolled.  She notes that blood sugars when she wakes up are running 190s.  She has various lesions along the legs that are difficult to heal.  Last eye exam: 05/13/2021 Last foot exam: needs Last A1c:  Lab Results  Component Value Date   HGBA1C 7.5 (H) 04/07/2021   Nephropathy screen indicated?: UTD Last flu, zoster and/or pneumovax:  Immunization History  Administered Date(s) Administered   PFIZER(Purple Top)SARS-COV-2 Vaccination 01/25/2020, 02/15/2020   Td 06/08/2005    ROS: No chest pain, shortness of breath, falls.  She continues to have intermittent drainage from the right stump.  Has an appointment with Dr. Donnetta Hutching tomorrow to discuss anticoagulation, which she is motivated to come off of    ROS: Per HPI  Allergies  Allergen Reactions   Aleve [Naproxen Sodium] Hives and Itching   Cortisone Swelling    Internal organs   Prednisone Swelling    Makes internal organs swell   Statins Other (See Comments)    Myalgia even with Pravastatin   Vancomycin Itching   Past Medical History:  Diagnosis Date   Arthritis     COPD (chronic obstructive pulmonary disease) (HCC)    Depression    DM (diabetes mellitus) ([place])    DVT (deep venous thrombosis) (HCC)    x5   Family history of colon cancer    GERD (gastroesophageal reflux disease)    Hyperlipemia    Hypertension    Hypothyroid    Obesity    Osteomyelitis of great toe of right foot ([place]) 07/04/2018   PVD (peripheral vascular disease) ([place])    Unilateral complete BKA, right, initial encounter ([place]) 07/11/2018   Unilateral complete BKA, right, subsequent encounter ([place])    Uterine cancer ([place])    UTI (urinary tract infection)     Current Outpatient Medications:    Accu-Chek Softclix Lancets lancets, Use as instructed to test BGs 1-2 times daily E11.52, Disp: 100 each, Rfl: 12   ascorbic acid (VITAMIN C) 500 MG tablet, Take 500 mg by mouth daily., Disp: , Rfl:    aspirin EC 81 MG EC tablet, Take 1 tablet (81 mg total) by mouth daily., Disp: , Rfl:    Cholecalciferol (VITAMIN D3) 5000 units TABS, Take 5,000 Units by mouth daily., Disp: , Rfl:    clopidogrel (PLAVIX) 75 MG tablet, TAKE 1 TABLET DAILY, Disp: 90 tablet, Rfl: 0   diclofenac Sodium (VOLTAREN) 1 % GEL, APPLY 2 GRAMS UP TO 4 TIMES A DAY AS NEEDED, Disp: 300 g, Rfl: 1   escitalopram (LEXAPRO) 10 MG tablet, TAKE 1 TABLET DAILY, Disp: 90 tablet, Rfl:  0   ezetimibe (ZETIA) 10 MG tablet, TAKE 1 TABLET DAILY, Disp: 90 tablet, Rfl: 0   furosemide (LASIX) 20 MG tablet, TAKE 1 TABLET DAILY, Disp: 90 tablet, Rfl: 0   gabapentin (NEURONTIN) 300 MG capsule, TAKE 1 CAPSULE AT BEDTIME, Disp: 30 capsule, Rfl: 2   glimepiride (AMARYL) 4 MG tablet, TAKE ONE TABLET BY MOUTH EVERY DAY WITH BREAKFAST, Disp: 30 tablet, Rfl: 0   glucose blood test strip, accucheck E11.52; Test BGs 1-2 times daily as directed, Disp: 100 each, Rfl: 12   levothyroxine (SYNTHROID) 100 MCG tablet, Take 1 tablet (100 mcg total) by mouth daily before breakfast., Disp: 90 tablet, Rfl: 3   losartan (COZAAR) 50 MG tablet, TAKE ONE TABLET  BY MOUTH EVERY DAY, Disp: 90 tablet, Rfl: 1   metFORMIN (GLUCOPHAGE-XR) 500 MG 24 hr tablet, TAKE TWO TABLETS IN THE MORNING AND AT BEDTIME, Disp: 360 tablet, Rfl: 0   nitroGLYCERIN (NITRODUR - DOSED IN MG/24 HR) 0.2 mg/hr patch, Place 1 patch (0.2 mg total) onto the skin daily., Disp: 30 patch, Rfl: 12   pantoprazole (PROTONIX) 40 MG tablet, TAKE 1 TABLET DAILY, Disp: 90 tablet, Rfl: 0   Potassium 99 MG TABS, Take 99 mg by mouth in the morning., Disp: , Rfl:    rivaroxaban (XARELTO) 20 MG TABS tablet, Take 1 tablet (20 mg total) by mouth daily with supper. (Patient taking differently: Take 20 mg by mouth in the morning.), Disp: 90 tablet, Rfl: 3   sennosides-docusate sodium (SENOKOT-S) 8.6-50 MG tablet, Take 1 tablet by mouth in the morning., Disp: , Rfl:    Tiotropium Bromide Monohydrate (SPIRIVA RESPIMAT) 2.5 MCG/ACT AERS, Inhale 2 puffs into the lungs daily. (Patient taking differently: Inhale 2 puffs into the lungs 3 (three) times a week.), Disp: 8 g, Rfl: 0   tretinoin (RETIN-A) 0.05 % cream, APPLY TO AFFECTED AREAS AT BEDTIME, Disp: 45 g, Rfl: 3   triamcinolone cream (KENALOG) 0.1 %, Apply topically 2 (two) times daily for up to 10 days per flare (Patient taking differently: Apply 1 application topically 2 (two) times daily as needed (skin rash/irritation.).), Disp: 30 g, Rfl: 0 Social History   Socioeconomic History   Marital status: Divorced    Spouse name: Danny   Number of children: 1   Years of education: 16   Highest education level: Master's degree (e.g., MA, MS, MEng, MEd, MSW, MBA)  Occupational History   Occupation: Product manager: Art gallery manager   Occupation: retired  Tobacco Use   Smoking status: Every Day    Packs/day: 1.00    Years: 38.00    Pack years: 38.00    Types: Cigarettes   Smokeless tobacco: Never  Vaping Use   Vaping Use: Never used  Substance and Sexual Activity   Alcohol use: No    Alcohol/week: 0.0 standard drinks   Drug use: Yes     Frequency: 4.0 times per week    Types: Marijuana   Sexual activity: Not Currently    Birth control/protection: Surgical  Other Topics Concern   Not on file  Social History Narrative   Lives alone in one bedroom apartment - has a ramp outside - no stairs -handicap accessible bathroom   R BKA, but has prosthetic leg she uses to walk, drive, etc   Lots of family nearby, great relationships - lots of help when needed   Social Determinants of Health   Financial Resource Strain: Low Risk    Difficulty of Paying Living Expenses:  Not hard at all  Food Insecurity: No Food Insecurity   Worried About Charity fundraiser in the Last Year: Never true   Ran Out of Food in the Last Year: Never true  Transportation Needs: No Transportation Needs   Lack of Transportation (Medical): No   Lack of Transportation (Non-Medical): No  Physical Activity: Inactive   Days of Exercise per Week: 0 days   Minutes of Exercise per Session: 0 min  Stress: Not on file  Social Connections: Moderately Integrated   Frequency of Communication with Friends and Family: More than three times a week   Frequency of Social Gatherings with Friends and Family: More than three times a week   Attends Religious Services: 1 to 4 times per year   Active Member of [employer] or Organizations: Yes   Attends Archivist Meetings: 1 to 4 times per year   Marital Status: Divorced  Human resources officer Violence: Not At Risk   Fear of Current or Ex-Partner: No   Emotionally Abused: No   Physically Abused: No   Sexually Abused: No   Family History  Problem Relation Age of Onset   Colon cancer Brother 39   Hypertension Mother    Hypothyroidism Mother    Heart disease Father    Diabetes Father    Hyperlipidemia Father    Clotting disorder Brother    Diabetes Brother    Heart disease Brother     Objective: Office vital signs reviewed. BP (!) 145/60    Pulse (!) 103    Temp (!) 97 F (36.1 C)    Ht 5\' 10"  (1.778 m)    Wt  242 lb (109.8 kg)    SpO2 97%    BMI 34.72 kg/m   Physical Examination:  General: Awake, alert, morbidly obese, No acute distress HEENT: Sclera white.  Moist mucous membranes Cardio: regular rate and rhythm  Pulm: normal work of breathing on room air Extremities: Right lower extremity surgically absent.  Ambulating independently bilateral stasis  Assessment/ Plan: 61 y.o. female   Type 2 diabetes mellitus with diabetic peripheral angiopathy and gangrene, without long-term current use of insulin ([place]) - Plan: Bayer DCA Hb A1c Waived, Semaglutide,0.25 or 0.5MG /DOS, (OZEMPIC, 0.25 OR 0.5 MG/DOSE,) 2 MG/1.5ML SOPN, AMB Referral to [place]  Hyperlipidemia associated with type 2 diabetes mellitus ([place])  Hypertension associated with chronic kidney disease due to type 2 diabetes mellitus ([place])  Acquired hypothyroidism - Plan: TSH, T4, free  S/P unilateral BKA (below knee amputation), right (HCC)  A1c has risen quite a bit since her last visit and is at 9.2% today.  Given her history of BKA I am hesitant to use anything like [place] or similar.  I do not think that Tradjenta would be sufficient in controlling blood sugar at this point.  We discussed GLP and I do not see that there are any apparent contraindications to use.  We discussed possible side effects.  A sample was provided of the 0.25 mg, which she will use every 7 days for the next 4 weeks then transition to the point 5 mg.  I have placed a referral to [facility] for medication assistance as this is her biggest barrier to utilizing this class of medication.  We discussed alternatives including Rybelsus today but patient preferred injection.  Would like her to discontinue the Amaryl and perhaps we can get rid of the metformin at some point if her blood sugar becomes well controlled  Blood pressure is not  at goal but hopefully will see some improvement with this as her weight goes down with the Rybelsus.  No medication changes  today  Not currently treated with a statin.  We will continue to readdress this as she has had myalgias even with pravastatin in the past.  Could consider Repatha.  Orders Placed This Encounter  Procedures   TSH   Bayer DCA Hb A1c Waived   T4, free   Meds ordered this encounter  Medications   Semaglutide,0.25 or 0.5MG /DOS, (OZEMPIC, 0.25 OR 0.5 MG/DOSE,) 2 MG/1.5ML SOPN    Sig: Inject 0.25 mg into the skin every 7 (seven) days.    Dispense:  1.5 mL    Refill:  0   Medications Discontinued During This Encounter  Medication Reason   doxycycline (VIBRA-TABS) 100 MG tablet    glimepiride (AMARYL) 4 MG tablet     We will see patient back in 3 months for recheck, sooner if concerns arise  [place], [place] [phone]

## 2021-07-09 ENCOUNTER — Encounter: Payer: Self-pay | Admitting: Vascular Surgery

## 2021-07-09 ENCOUNTER — Other Ambulatory Visit: Payer: Self-pay

## 2021-07-09 ENCOUNTER — Ambulatory Visit: Payer: Medicare HMO | Admitting: Vascular Surgery

## 2021-07-09 VITALS — BP 151/67 | HR 104 | Temp 97.0°F | Resp 22 | Ht 70.0 in | Wt 248.5 lb

## 2021-07-09 DIAGNOSIS — I739 Peripheral vascular disease, unspecified: Secondary | ICD-10-CM | POA: Diagnosis not present

## 2021-07-09 LAB — TSH: TSH: 3.11 u[IU]/mL (ref 0.450–4.500)

## 2021-07-09 LAB — T4, FREE: Free T4: 1.26 ng/dL (ref 0.82–1.77)

## 2021-07-09 NOTE — Progress Notes (Signed)
[facility]  Patient name: [name] MRN: [id number] DOB: 22-Aug-1960 Sex: female  REASON FOR VISIT: Follow-up known peripheral vascular occlusive disease  HPI: [name] is a 61 y.o. female here today for follow-up.  She has a very long past history peripheral vascular occlusive disease dating back 15 years.  She had initially presented at age 55 with atheroemboli to her fourth and fifth toes with gangrenous changes.  She was found to have high-grade superficial femoral artery lesion and had bypass and amputation of her toes.  She was able to heal her toes but had recurrent failures of her bypass.  Eventually underwent prosthetic bypass followed by vein harvest from her left leg.  All of these failed.  She had a long interval with an occluded femoropopliteal where she was tolerating the level of ischemia but eventually went on to limb threatening issues with wound issues and had below-knee amputation with Dr. Sharol Given.  She does have known mild superficial femoral disease on the left with SFA stenosis.  She does not have any symptoms associated with this.  Past Medical History:  Diagnosis Date   Arthritis    COPD (chronic obstructive pulmonary disease) ([place])    Depression    DM (diabetes mellitus) ([place])    DVT (deep venous thrombosis) (HCC)    x5   Family history of colon cancer    GERD (gastroesophageal reflux disease)    Hyperlipemia    Hypertension    Hypothyroid    Obesity    Osteomyelitis of great toe of right foot ([place]) 07/04/2018   PVD (peripheral vascular disease) ([place])    Unilateral complete BKA, right, initial encounter ([place]) 07/11/2018   Unilateral complete BKA, right, subsequent encounter ([place])    Uterine cancer ([place])    UTI (urinary tract infection)     Family History  Problem Relation Age of Onset   Colon cancer Brother 29   Hypertension Mother    Hypothyroidism Mother    Heart disease  Father    Diabetes Father    Hyperlipidemia Father    Clotting disorder Brother    Diabetes Brother    Heart disease Brother     SOCIAL HISTORY: Social History   Tobacco Use   Smoking status: Every Day    Packs/day: 1.00    Years: 38.00    Pack years: 38.00    Types: Cigarettes   Smokeless tobacco: Never  Substance Use Topics   Alcohol use: No    Alcohol/week: 0.0 standard drinks    Allergies  Allergen Reactions   Aleve [Naproxen Sodium] Hives and Itching   Cortisone Swelling    Internal organs   Prednisone Swelling    Makes internal organs swell   Statins Other (See Comments)    Myalgia even with Pravastatin   Vancomycin Itching    Current Outpatient Medications  Medication Sig Dispense Refill   Accu-Chek Softclix Lancets lancets Use as instructed to test BGs 1-2 times daily E11.52 100 each 12   ascorbic acid (VITAMIN C) 500 MG tablet Take 500 mg by mouth daily.     aspirin EC 81 MG EC tablet Take 1 tablet (81 mg total) by mouth daily.     Cholecalciferol (VITAMIN D3) 5000 units TABS Take 5,000 Units by mouth daily.     clopidogrel (PLAVIX) 75 MG tablet TAKE 1 TABLET DAILY 90 tablet 0   diclofenac Sodium (VOLTAREN) 1 % GEL APPLY 2 GRAMS UP TO 4 TIMES  A DAY AS NEEDED 300 g 1   escitalopram (LEXAPRO) 10 MG tablet TAKE 1 TABLET DAILY 90 tablet 0   ezetimibe (ZETIA) 10 MG tablet TAKE 1 TABLET DAILY 90 tablet 0   furosemide (LASIX) 20 MG tablet TAKE 1 TABLET DAILY 90 tablet 0   gabapentin (NEURONTIN) 300 MG capsule TAKE 1 CAPSULE AT BEDTIME 30 capsule 2   glucose blood test strip accucheck E11.52; Test BGs 1-2 times daily as directed 100 each 12   levothyroxine (SYNTHROID) 100 MCG tablet Take 1 tablet (100 mcg total) by mouth daily before breakfast. 90 tablet 3   losartan (COZAAR) 50 MG tablet TAKE ONE TABLET BY MOUTH EVERY DAY 90 tablet 1   metFORMIN (GLUCOPHAGE-XR) 500 MG 24 hr tablet TAKE TWO TABLETS IN THE MORNING AND AT BEDTIME 360 tablet 0   nitroGLYCERIN  (NITRODUR - DOSED IN MG/24 HR) 0.2 mg/hr patch Place 1 patch (0.2 mg total) onto the skin daily. 30 patch 12   pantoprazole (PROTONIX) 40 MG tablet TAKE 1 TABLET DAILY 90 tablet 0   Potassium 99 MG TABS Take 99 mg by mouth in the morning.     rivaroxaban (XARELTO) 20 MG TABS tablet Take 1 tablet (20 mg total) by mouth daily with supper. (Patient taking differently: Take 20 mg by mouth in the morning.) 90 tablet 3   Semaglutide,0.25 or 0.5MG /DOS, (OZEMPIC, 0.25 OR 0.5 MG/DOSE,) 2 MG/1.5ML SOPN Inject 0.25 mg into the skin every 7 (seven) days. 1.5 mL 0   sennosides-docusate sodium (SENOKOT-S) 8.6-50 MG tablet Take 1 tablet by mouth in the morning.     Tiotropium Bromide Monohydrate (SPIRIVA RESPIMAT) 2.5 MCG/ACT AERS Inhale 2 puffs into the lungs daily. (Patient taking differently: Inhale 2 puffs into the lungs 3 (three) times a week.) 8 g 0   tretinoin (RETIN-A) 0.05 % cream APPLY TO AFFECTED AREAS AT BEDTIME 45 g 3   triamcinolone cream (KENALOG) 0.1 % Apply topically 2 (two) times daily for up to 10 days per flare (Patient taking differently: Apply 1 application topically 2 (two) times daily as needed (skin rash/irritation.).) 30 g 0   No current facility-administered medications for this visit.    REVIEW OF SYSTEMS:  [X]  denotes positive finding, [ ]  denotes negative finding Cardiac  Comments:  Chest pain or chest pressure:    Shortness of breath upon exertion:    Short of breath when lying flat:    Irregular heart rhythm:        Vascular    Pain in calf, thigh, or hip brought on by ambulation:    Pain in feet at night that wakes you up from your sleep:     Blood clot in your veins:    Leg swelling:           PHYSICAL EXAM: Vitals:   07/09/21 1445  BP: (!) 151/67  Pulse: (!) 104  Resp: (!) 22  Temp: (!) 97 F (36.1 C)  SpO2: 96%  Weight: 248 lb 8 oz (112.7 kg)  Height: 5\' 10"  (1.778 m)    GENERAL: The patient is a well-nourished female, in no acute distress. The vital  signs are documented above. CARDIOVASCULAR: I do not palpate pedal pulse on the left. PULMONARY: There is good air exchange  MUSCULOSKELETAL: There are no major deformities or cyanosis. NEUROLOGIC: No focal weakness or paresthesias are detected. SKIN: There are no ulcers or rashes noted. PSYCHIATRIC: The patient has a normal affect.  DATA:  Most recent noninvasive studies from March  2020 revealed a left ankle arm index of 0.8 to  MEDICAL ISSUES: Patient remained stable.  She does have continued slow healing of her revised right below-knee amputation.  She is walking with a prosthesis today.  She is questioning whether she can stop her anticoagulation.  She has been maintained on Coumadin for many years and now is on Eliquis.  She certainly has no arterial indication since she is does not have a patent bypass.  She does not recall history of DVT but this is documented in her chart.  She does not have any documentation of hypercoagulable state and was placed on anticoagulation initially due to recurrent graft failure with no explanation.  I feel that it is safe to discontinue her anticoagulation at this time.  This would need to be resumed should she developed venous thrombosis in the future.  She will see [place] again on an as-needed basis    Rosetta Posner, MD FACS [facility] Tel [phone]  Note: Portions of this report may have been transcribed using voice recognition software.  Every effort has been made to ensure accuracy; however, inadvertent computerized transcription errors may still be present.

## 2021-07-16 ENCOUNTER — Ambulatory Visit (INDEPENDENT_AMBULATORY_CARE_PROVIDER_SITE_OTHER): Payer: Medicare HMO

## 2021-07-16 ENCOUNTER — Encounter: Payer: Self-pay | Admitting: Family

## 2021-07-16 ENCOUNTER — Telehealth: Payer: Self-pay

## 2021-07-16 ENCOUNTER — Other Ambulatory Visit: Payer: Self-pay

## 2021-07-16 ENCOUNTER — Ambulatory Visit (INDEPENDENT_AMBULATORY_CARE_PROVIDER_SITE_OTHER): Payer: Medicare HMO | Admitting: Family

## 2021-07-16 DIAGNOSIS — J449 Chronic obstructive pulmonary disease, unspecified: Secondary | ICD-10-CM | POA: Diagnosis not present

## 2021-07-16 DIAGNOSIS — Z89511 Acquired absence of right leg below knee: Secondary | ICD-10-CM

## 2021-07-16 DIAGNOSIS — E119 Type 2 diabetes mellitus without complications: Secondary | ICD-10-CM | POA: Diagnosis not present

## 2021-07-16 DIAGNOSIS — S88111S Complete traumatic amputation at level between knee and ankle, right lower leg, sequela: Secondary | ICD-10-CM

## 2021-07-16 NOTE — Progress Notes (Signed)
Office Visit Note   Patient: [name]           Date of Birth: 11/27/1960           MRN: [id number] Visit Date: 07/16/2021              Requested by: Janora Norlander, DO [place],  [place] [address] PCP: Janora Norlander, DO  Chief Complaint  Patient presents with   Right Leg - Follow-up      HPI: Patient is a 61 year old woman who is status post right transtibial amputation revision back in August 2022.  Patient has had a recurrent breakdown ulcer on the medial aspect of the residual limb with some drainage, this is close to her revascularization scar.  She is currently on doxycycline, has completed 4 weeks.  Has been using nitroglycerin patches. Mupirocin dressings.  She continues to complain of drainage that she describes as milky and thick  Denies fevers chills or feeling poorly  Negative wound cultures 2 weeks ago.  Assessment & Plan: Visit Diagnoses:  1. Below-knee amputation of right lower extremity with complication, sequela ([place])     Plan: wound is stable. She is comfortable continuing with current wound care.  Discussed possibility of I and D. Will follow up in office with Dr. Sharol Given and discuss. Will call or return for any concerns.  Follow-Up Instructions: No follow-ups on file.   Ortho Exam  Patient is alert, oriented, no adenopathy, well-dressed, normal affect, normal respiratory effort. Examination the residual limb is well consolidated there is no cellulitis there is a small wound medially that is 2 mm in diameter and tracts. Does not probe to bone. Is nontender. no cellulitis. Scant serous drainage.   Imaging: No results found. No images are attached to the encounter.  Labs: Lab Results  Component Value Date   HGBA1C 9.2 (H) 07/08/2021   HGBA1C 7.5 (H) 04/07/2021   HGBA1C 8.3 (H) 01/24/2021   ESRSEDRATE 19 10/25/2012   CRP 0.6 10/25/2012   LABURIC 5.7 12/10/2006   REPTSTATUS 07/09/2018 FINAL 07/04/2018   CULT NO GROWTH  5 DAYS 07/04/2018     Lab Results  Component Value Date   ALBUMIN 3.6 07/19/2020   ALBUMIN 3.7 (L) 06/26/2020   ALBUMIN 3.9 12/27/2019    Lab Results  Component Value Date   MG 1.7 07/08/2018   Lab Results  Component Value Date   VD25OH 67.6 03/29/2019   VD25OH 72.2 12/27/2018   VD25OH 56.8 11/09/2018    No results found for: PREALBUMIN CBC EXTENDED Latest Ref Rng & Units 01/24/2021 07/19/2020 06/26/2020  WBC 4.0 - 10.5 K/uL 15.0(H) 10.5 11.5(H)  RBC 3.87 - 5.11 MIL/uL 5.28(H) 5.35(H) 5.08  HGB 12.0 - 15.0 g/dL 15.1(H) 15.5(H) 14.7  HCT 36.0 - 46.0 % 46.2(H) 47.7(H) 44.0  PLT 150 - 400 K/uL 263 267 273  NEUTROABS 1.7 - 7.7 K/uL - 5.3 -  LYMPHSABS 0.7 - 4.0 K/uL - 3.9 -     There is no height or weight on file to calculate BMI.  Orders:  Orders Placed This Encounter  Procedures   XR Tibia/Fibula Right    No orders of the defined types were placed in this encounter.    Procedures: No procedures performed  Clinical Data: No additional findings.  ROS:  All other systems negative, except as noted in the HPI. Review of Systems  Objective: Vital Signs: There were no vitals taken for this visit.  Specialty Comments:  No  specialty comments available.  PMFS History: Patient Active Problem List   Diagnosis Date Noted   Wound dehiscence 01/24/2021   Dehiscence of amputation stump ([place])    Abscess 01/07/2021   History of DVT (deep vein thrombosis) 07/03/2019   Statin intolerance 04/19/2019   Vitamin D deficiency 12/27/2018   Type 2 diabetes mellitus with diabetic peripheral angiopathy and gangrene, without long-term current use of insulin ([place]) 12/27/2018   DVT of axillary vein, chronic, bilateral ([place]) 12/27/2018   Hypertension associated with chronic kidney disease due to type 2 diabetes mellitus ([place]) 12/27/2018   Long term current use of anticoagulant 12/27/2018   Medically noncompliant 12/27/2018   S/P unilateral BKA (below knee amputation), right  (HCC)    Subtherapeutic international normalized ratio (INR)    Noncompliance    Leukocytosis    Acute blood loss anemia    Diabetes mellitus type 2 in obese [facility])    Acquired absence of right leg below knee ([place]) 07/08/2018   Mild protein-calorie malnutrition (HCC)    Intermittent claudication ([place]) 05/11/2017   Deep vein thrombosis (DVT) of both lower extremities ([place]) 01/05/2017   Peripheral vascular insufficiency ([place]) 09/25/2015   Hypothyroidism 01/29/2014   Obesity (BMI 30-39.9) 08/16/2013   Impetigo 07/28/2013   Chronic obstructive pulmonary disease ([place])    Malignant neoplasm of uterus ([place])    GERD (gastroesophageal reflux disease) 10/18/2012   Hernia, hiatal 10/18/2012   Hyperlipidemia associated with type 2 diabetes mellitus ([place]) 04/18/2008   SMOKER 04/18/2008   Past Medical History:  Diagnosis Date   Arthritis    COPD (chronic obstructive pulmonary disease) (HCC)    Depression    DM (diabetes mellitus) (HCC)    DVT (deep venous thrombosis) (HCC)    x5   Family history of colon cancer    GERD (gastroesophageal reflux disease)    Hyperlipemia    Hypertension    Hypothyroid    Obesity    Osteomyelitis of great toe of right foot ([place]) 07/04/2018   PVD (peripheral vascular disease) (HCC)    Unilateral complete BKA, right, initial encounter ([place]) 07/11/2018   Unilateral complete BKA, right, subsequent encounter ([place])    Uterine cancer ([place])    UTI (urinary tract infection)     Family History  Problem Relation Age of Onset   Colon cancer Brother 31   Hypertension Mother    Hypothyroidism Mother    Heart disease Father    Diabetes Father    Hyperlipidemia Father    Clotting disorder Brother    Diabetes Brother    Heart disease Brother     Past Surgical History:  Procedure Laterality Date   ABDOMINAL HYSTERECTOMY     AMPUTATION Right 07/08/2018   Procedure: RIGHT BELOW KNEE AMPUTATION;  Surgeon: [name], MD;  Location: [facility];  Service: Orthopedics;   Laterality: Right;   CESAREAN SECTION     FEMORAL BYPASS     x 5   LUMBAR DISC SURGERY     L4-L5   STUMP REVISION Right 01/24/2021   Procedure: right below the knee amputation revision;  Surgeon: [name], MD;  Location: [facility];  Service: Orthopedics;  Laterality: Right;   TOE AMPUTATION     right   TONSILLECTOMY     Social History   Occupational History   Occupation: Product manager: Art gallery manager   Occupation: retired  Tobacco Use   Smoking status: Every Day    Packs/day: 1.00    Years: 38.00  Pack years: 38.00    Types: Cigarettes   Smokeless tobacco: Never  Vaping Use   Vaping Use: Never used  Substance and Sexual Activity   Alcohol use: No    Alcohol/week: 0.0 standard drinks   Drug use: Yes    Frequency: 4.0 times per week    Types: Marijuana   Sexual activity: Not Currently    Birth control/protection: Surgical

## 2021-07-16 NOTE — Chronic Care Management (AMB) (Signed)
Chronic Care Management   Note  07/16/2021 Name: [name] MRN: [id number] DOB: [birth year]  [name] is a 61 y.o. year old female who is a primary care patient of Janora Norlander, DO. I reached out to Hosie Spangle by phone today in response to a referral sent by [name] PCP.  [name] was given information about Chronic Care Management services today including:  CCM service includes personalized support from designated clinical staff supervised by her physician, including individualized plan of care and coordination with other care providers 24/7 contact phone numbers for assistance for urgent and routine care needs. Service will only be billed when office clinical staff spend 20 minutes or more in a month to coordinate care. Only one practitioner may furnish and bill the service in a calendar month. The patient may stop CCM services at any time (effective at the end of the month) by phone call to the office staff. The patient is responsible for co-pay (up to 20% after annual deductible is met) if co-pay is required by the individual health plan.   Patient agreed to services and verbal consent obtained.   Follow up plan: Face to Face appointment with care management team member scheduled for: 08/13/2021  Noreene Larsson, [place], [place], [place] [address] Direct Dial: [phone] [email or web address]

## 2021-07-21 ENCOUNTER — Encounter: Payer: Self-pay | Admitting: Family Medicine

## 2021-07-25 ENCOUNTER — Other Ambulatory Visit: Payer: Medicare HMO

## 2021-08-04 ENCOUNTER — Other Ambulatory Visit: Payer: Self-pay | Admitting: Family Medicine

## 2021-08-04 DIAGNOSIS — K219 Gastro-esophageal reflux disease without esophagitis: Secondary | ICD-10-CM

## 2021-08-04 DIAGNOSIS — F339 Major depressive disorder, recurrent, unspecified: Secondary | ICD-10-CM

## 2021-08-13 ENCOUNTER — Ambulatory Visit (INDEPENDENT_AMBULATORY_CARE_PROVIDER_SITE_OTHER): Payer: Medicare HMO | Admitting: Pharmacist

## 2021-08-13 DIAGNOSIS — E1152 Type 2 diabetes mellitus with diabetic peripheral angiopathy with gangrene: Secondary | ICD-10-CM

## 2021-08-13 DIAGNOSIS — E1122 Type 2 diabetes mellitus with diabetic chronic kidney disease: Secondary | ICD-10-CM

## 2021-08-13 NOTE — Progress Notes (Unsigned)
Starting rybelsus '3mg'$  Sample left up front

## 2021-08-18 ENCOUNTER — Other Ambulatory Visit: Payer: Self-pay | Admitting: Family Medicine

## 2021-08-18 ENCOUNTER — Ambulatory Visit: Payer: Medicare HMO | Admitting: Orthopedic Surgery

## 2021-08-18 DIAGNOSIS — E1152 Type 2 diabetes mellitus with diabetic peripheral angiopathy with gangrene: Secondary | ICD-10-CM

## 2021-08-27 NOTE — Patient Instructions (Signed)
Visit Information ? ?Following are the goals we discussed today:  ?Current Barriers:  ?Unable to independently afford treatment regimen ?Unable to achieve control of T2DM  ?Suboptimal therapeutic regimen for T2DM ? ? ?Pharmacist Clinical Goal(s):  ?patient will verbalize ability to afford treatment regimen ?achieve control of T2DM as evidenced by GOAL A1C through collaboration with PharmD and provider.  ? ? ?Interventions: ?1:1 collaboration with Janora Norlander, DO regarding development and update of comprehensive plan of care as evidenced by provider attestation and co-signature ?Inter-disciplinary care team collaboration (see longitudinal plan of care) ?Comprehensive medication review performed; medication list updated in electronic medical record ? ?Diabetes: New goal. ?Uncontrolled--A1C UP TO 9.2%; current treatment: starting rybelsus '3mg'$  sample pack, metformin;  ?Denies personal and family history of Medullary thyroid cancer (MTC) ?Couldn't tolerate ozempic, we are unable to get trulicity due to backorder ?Start rybelsus '3mg'$  --> titrate to '7mg'$  daily (when patient assistance arrives) ?Intolerances to: ozempic (nausea/diarrhea) ?Current glucose readings: fasting glucose: <200, post prandial glucose: 200s ?Denies hypoglycemic/hyperglycemic symptoms ?Current exercise: n/a ?Recommended rybelsus ?Assessed patient finances. Novo nordisk patient assistance for rybelsus ?Discussed cholesterol and blood pressure ? ? ?Patient Goals/Self-Care Activities ?patient will:  ?- take medications as prescribed as evidenced by patient report and record review ?check glucose DAILY OR IF SYMPTOMATIC, document, and provide at future appointments ?collaborate with provider on medication access solutions ?target a minimum of 150 minutes of moderate intensity exercise weekly ?engage in dietary modifications by FOLLOWING A HEART HEALTHY DIET/HEALTHY PLATE METHOD ? ? ? ?Plan: Telephone follow up appointment with care management team  member scheduled for:  1 month ? ?Signature ?Regina Eck, PharmD, BCPS ?Clinical Pharmacist, [facility] ?[place]  II Phone [phone] ? ? ?Please call the care guide team at [phone] if you need to cancel or reschedule your appointment.  ? ?Patient verbalizes understanding of instructions and care plan provided today and agrees to view in [place]. Active MyChart status confirmed with patient.   ? ?

## 2021-08-28 ENCOUNTER — Other Ambulatory Visit: Payer: Self-pay | Admitting: Family Medicine

## 2021-08-28 DIAGNOSIS — E1152 Type 2 diabetes mellitus with diabetic peripheral angiopathy with gangrene: Secondary | ICD-10-CM

## 2021-09-12 ENCOUNTER — Other Ambulatory Visit: Payer: Self-pay | Admitting: Family Medicine

## 2021-09-16 ENCOUNTER — Telehealth: Payer: Self-pay | Admitting: Family Medicine

## 2021-09-16 NOTE — Telephone Encounter (Signed)
Left voicemail for pt regarding novo nordisk shipment. Medication sent to shipment processing as of 08/25/21 and should arrive in the next 1-2 weeks hopefully. Gave novo nordisk number [phone] and my direct line [phone] ? ?

## 2021-09-16 NOTE — Telephone Encounter (Signed)
Can you please f/u with patient re: rybelsus?! ? ?Thank you! ?

## 2021-09-18 ENCOUNTER — Ambulatory Visit (INDEPENDENT_AMBULATORY_CARE_PROVIDER_SITE_OTHER): Payer: Medicare HMO | Admitting: Pharmacist

## 2021-09-18 ENCOUNTER — Encounter: Payer: Self-pay | Admitting: Pharmacist

## 2021-09-18 DIAGNOSIS — E1169 Type 2 diabetes mellitus with other specified complication: Secondary | ICD-10-CM

## 2021-09-18 DIAGNOSIS — E1152 Type 2 diabetes mellitus with diabetic peripheral angiopathy with gangrene: Secondary | ICD-10-CM

## 2021-09-18 MED ORDER — GLIMEPIRIDE 4 MG PO TABS: 4.0000 mg | ORAL_TABLET | Freq: Every day | ORAL | 3 refills | Status: DC

## 2021-09-18 NOTE — Progress Notes (Signed)
? ? ?Chronic Care Management ?Pharmacy Note ? ?09/18/2021 ?Name:  [name] MRN:  [id number] DOB:  1961-05-06 ? ?Summary: ? ?Diabetes: Goal on track: NO. ?Uncontrolled--A1C UP TO 9.2%; current treatment: metformin, adding back glimepiride ?Intolerances: ozemipc, rybelsus (GI), jardiance (yeast) ?Patient couldn't tolerate rybelsus 63m, reports 21lb weight loss in 1 months due to not being able to eat ?Discussed re-trial of SGLT2s/DPP4 combos; patient wishes to start back on glimepiride with metformin and do diet/lifestyle ?Discussed with patient that she will likely need additional therapy for t2dm given higher BG readings ?Current glucose readings: fasting glucose: <200, post prandial glucose: 200s ?Denies hypoglycemic/hyperglycemic symptoms ?Current exercise: n/a ?Discussed cholesterol and blood pressure ? ?Subjective: ?[name] an 61y.o. year old female who is a primary patient of GJanora Norlander DO.  The CCM team was consulted for assistance with disease management and care coordination needs.   ? ?Engaged with patient by telephone for follow up visit in response to provider referral for pharmacy case management and/or care coordination services.  ? ?Consent to Services:  ?The patient was given information about Chronic Care Management services, agreed to services, and gave verbal consent prior to initiation of services.  Please see initial visit note for detailed documentation.  ? ?Patient Care Team: ?GJanora Norlander DO as PCP - General (Family Medicine) ?DNewt Minion MD as Consulting Physician (Orthopedic Surgery) ?JCelestia Khat OD (Optometry) ?PLavera Guise [facility] Hospitalas Pharmacist (Family Medicine) ? ? ?Objective: ? ?Lab Results  ?Component Value Date  ? CREATININE 0.78 01/24/2021  ? CREATININE 0.66 07/19/2020  ? CREATININE 0.75 06/26/2020  ? ? ?Lab Results  ?Component Value Date  ? HGBA1C 9.2 (H) 07/08/2021  ? ?Last diabetic Eye exam:  ?Lab Results  ?Component Value Date/Time  ?  HMDIABEYEEXA No Retinopathy 05/24/2018 12:00 AM  ?  ?Last diabetic Foot exam: No results found for: HMDIABFOOTEX  ? ?   ?Component Value Date/Time  ? CHOL 228 (H) 06/26/2020 0932  ? CHOL 136 12/21/2012 1707  ? TRIG 249 (H) 06/26/2020 0932  ? TRIG 192 (H) 04/21/2013 1512  ? TRIG 164 (H) 12/21/2012 1707  ? HDL 29 (L) 06/26/2020 0932  ? HDL 32 (L) 04/21/2013 1512  ? HDL 27 (L) 12/21/2012 1707  ? CHOLHDL 7.9 (H) 06/26/2020 0932  ? L[place]153 (H) 06/26/2020 0932  ? LDLCALC 104 (H) 04/21/2013 1512  ? L[place]76 12/21/2012 1707  ? ? ? ?  Latest Ref Rng & Units 07/19/2020  ?  1:24 PM 06/26/2020  ?  9:32 AM 12/27/2019  ?  1:05 PM  ?Hepatic Function  ?Total Protein 6.5 - 8.1 g/dL 7.4   6.9   6.8    ?Albumin 3.5 - 5.0 g/dL 3.6   3.7   3.9    ?AST 15 - 41 U/L 33   27   15    ?ALT 0 - 44 U/L 37   35   14    ?Alk Phosphatase 38 - 126 U/L 44   59   51    ?Total Bilirubin 0.3 - 1.2 mg/dL 0.5   0.3   0.2    ? ? ?Lab Results  ?Component Value Date/Time  ? TSH 3.110 07/08/2021 02:57 PM  ? TSH 2.600 04/07/2021 02:25 PM  ? FREET4 1.26 07/08/2021 02:57 PM  ? FREET4 1.12 04/07/2021 02:25 PM  ? ? ? ?  Latest Ref Rng & Units 01/24/2021  ? 10:30 AM 07/19/2020  ?  1:24 PM  06/26/2020  ?  9:32 AM  ?CBC  ?WBC 4.0 - 10.5 K/uL 15.0   10.5   11.5    ?Hemoglobin 12.0 - 15.0 g/dL 15.1   15.5   14.7    ?Hematocrit 36.0 - 46.0 % 46.2   47.7   44.0    ?Platelets 150 - 400 K/uL 263   267   273    ? ? ?Lab Results  ?Component Value Date/Time  ? VD25OH 67.6 03/29/2019 03:02 PM  ? VD25OH 72.2 12/27/2018 12:23 PM  ? ? ?Clinical ASCVD: No  ?The 10-year ASCVD risk score (Arnett DK, et al., 2019) is: 35.1% ?  Values used to calculate the score: ?    Age: 61 years ?    Sex: Female ?    Is Non-Hispanic African American: No ?    Diabetic: Yes ?    Tobacco smoker: Yes ?    Systolic Blood Pressure: 453 mmHg ?    Is BP treated: Yes ?    HDL Cholesterol: 29 mg/dL ?    Total Cholesterol: 228 mg/dL   ? ?Other: (CHADS2VASc if Afib, PHQ9 if depression, MMRC or CAT for COPD,  ACT, DEXA) ? ?Social History  ? ?Tobacco Use  ?Smoking Status Every Day  ? Packs/day: 1.00  ? Years: 38.00  ? Pack years: 38.00  ? Types: Cigarettes  ?Smokeless Tobacco Never  ? ?BP Readings from Last 3 Encounters:  ?07/09/21 (!) 151/67  ?07/08/21 (!) 145/60  ?04/07/21 132/64  ? ?Pulse Readings from Last 3 Encounters:  ?07/09/21 (!) 104  ?07/08/21 (!) 103  ?04/07/21 86  ? ?Wt Readings from Last 3 Encounters:  ?07/09/21 248 lb 8 oz (112.7 kg)  ?07/08/21 242 lb (109.8 kg)  ?06/16/21 240 lb (108.9 kg)  ? ? ?Assessment: Review of patient past medical history, allergies, medications, health status, including review of consultants reports, laboratory and other test data, was performed as part of comprehensive evaluation and provision of chronic care management services.  ? ?SDOH:  (Social Determinants of Health) assessments and interventions performed:  ? ? ?CCM Care Plan ? ?Allergies  ?Allergen Reactions  ? Aleve [Naproxen Sodium] Hives and Itching  ? Cortisone Swelling  ?  Internal organs  ? Ozempic (0.25 Or 0.5 Mg-Dose) [Semaglutide(0.25 Or 0.68m-Dos)] Diarrhea and Nausea Only  ? Prednisone Swelling  ?  Makes internal organs swell  ? Statins Other (See Comments)  ?  Myalgia even with Pravastatin  ? Vancomycin Itching  ? Janumet [Sitagliptin-Metformin Hcl]   ?  Nausea/diarrhea ?  ? Semaglutide Diarrhea and Nausea And Vomiting  ?  RYBELSUS --PATIENT CAN'T TOLERATE 3MG DAILY  ? ? ?Medications Reviewed Today   ? ? Reviewed by PLavera Guise RKind[facility](Pharmacist) on 08/27/21 at [phone] Med List Status: <None>  ? ?Medication Order Taking? Sig Documenting Provider Last Dose Status Informant  ?Accu-Chek Softclix Lancets lancets [id number] Use as instructed to test BGs 1-2 times daily E11.52 GRonnie DossM, DO  Active Self  ?ascorbic acid (VITAMIN C) 500 MG tablet [id number] Take 500 mg by mouth daily. [provider]  Active Self  ?aspirin EC 81 MG EC tablet [id number] Take 1 tablet (81 mg total) by mouth daily. LBary Leriche PA-C  Active Self  ?Cholecalciferol (VITAMIN D3) 5000 units TABS [id number] Take 5,000 Units by mouth daily. [provider]  Active Self  ?clopidogrel (PLAVIX) 75 MG tablet [id number] TAKE 1 TABLET DAILY GRonnie DossM, DO  Active   ?  diclofenac Sodium (VOLTAREN) 1 % GEL [id number]  APPLY 2 GRAMS UP TO 4 TIMES A DAY AS NEEDED Ronnie Doss M, DO  Active   ?escitalopram (LEXAPRO) 10 MG tablet [id number]  TAKE 1 TABLET DAILY Ronnie Doss M, DO  Active   ?ezetimibe (ZETIA) 10 MG tablet [id number]  TAKE 1 TABLET DAILY Ronnie Doss M, DO  Active   ?furosemide (LASIX) 20 MG tablet [id number]  TAKE 1 TABLET DAILY Ronnie Doss M, DO  Active   ?gabapentin (NEURONTIN) 300 MG capsule [id number]  TAKE 1 CAPSULE AT BEDTIME Ronnie Doss M, DO  Active   ?glucose blood (ACCU-CHEK GUIDE) test strip [id number]  Check BS BID Dx E11.52 Ronnie Doss M, DO  Active   ?levothyroxine (SYNTHROID) 100 MCG tablet [id number]  Take 1 tablet (100 mcg total) by mouth daily before breakfast. Janora Norlander, DO  Active Self  ?losartan (COZAAR) 50 MG tablet [id number]  TAKE ONE TABLET BY MOUTH EVERY DAY Ronnie Doss M, DO  Active   ?metFORMIN (GLUCOPHAGE-XR) 500 MG 24 hr tablet [id number]  TAKE TWO TABLETS IN THE MORNING AND AT BEDTIME Ronnie Doss M, DO  Active   ?nitroGLYCERIN (NITRODUR - DOSED IN MG/24 HR) 0.2 mg/hr patch [id number]  Place 1 patch (0.2 mg total) onto the skin daily. Suzan Slick, NP  Active   ?pantoprazole (PROTONIX) 40 MG tablet [id number]  TAKE 1 TABLET DAILY Ronnie Doss M, DO  Active   ?Potassium 99 MG TABS [id number]  Take 99 mg by mouth in the morning. [provider]  Active Self  ?rivaroxaban (XARELTO) 20 MG TABS tablet [id number]  Take 1 tablet (20 mg total) by mouth daily with supper.  ?Patient taking differently: Take 20 mg by mouth in the morning.  ? Janora Norlander, DO  Active   ?  Discontinued 08/27/21 0824 (Change in therapy)    ?sennosides-docusate sodium (SENOKOT-S) 8.6-50 MG tablet [id number]  Take 1 tablet by mouth in the morning. [provider]  Active Self  ?Tiotropium Bromide Monohydrate (SPIRIVA RESPIMAT) 2.5 MCG/ACT AERS [phone]

## 2021-09-18 NOTE — Patient Instructions (Signed)
Visit Information ? ?Following are the goals we discussed today:  ?Current Barriers:  ?Unable to independently afford treatment regimen ?Unable to achieve control of T2DM  ?Suboptimal therapeutic regimen for T2DM ? ?Pharmacist Clinical Goal(s):  ?patient will verbalize ability to afford treatment regimen ?achieve control of T2DM as evidenced by GOAL A1C through collaboration with PharmD and provider.  ? ? ?Interventions: ?1:1 collaboration with Janora Norlander, DO regarding development and update of comprehensive plan of care as evidenced by provider attestation and co-signature ?Inter-disciplinary care team collaboration (see longitudinal plan of care) ?Comprehensive medication review performed; medication list updated in electronic medical record ? ?Diabetes: Goal on track: NO. ?Uncontrolled--A1C UP TO 9.2%; current treatment: metformin, adding back glimepiride ?Intolerances: ozemipc, rybelsus (GI), jardiance (yeast) ?Patient couldn't tolerate rybelsus '3mg'$ , reports 21lb weight loss in 1 months due to not being able to eat ?Discussed re-trial of SGLT2s/DPP4 combos; patient wishes to start back on glimepiride with metformin and do diet/lifestyle ?Discussed with patient that she will likely need additional therapy for t2dm given higher BG readings ?Current glucose readings: fasting glucose: <200, post prandial glucose: 200s ?Denies hypoglycemic/hyperglycemic symptoms ?Current exercise: n/a ?Discussed cholesterol and blood pressure ? ? ?Patient Goals/Self-Care Activities ?patient will:  ?- take medications as prescribed as evidenced by patient report and record review ?check glucose DAILY OR IF SYMPTOMATIC, document, and provide at future appointments ?collaborate with provider on medication access solutions ?target a minimum of 150 minutes of moderate intensity exercise weekly ?engage in dietary modifications by FOLLOWING A HEART HEALTHY DIET/HEALTHY PLATE METHOD ? ? ? ?Plan: Next PCP appointment scheduled for:   10/2021 ? ?Signature ?Regina Eck, PharmD, BCPS ?Clinical Pharmacist, [facility] ?[place]  II Phone [phone] ? ? ?Please call the care guide team at [phone] if you need to cancel or reschedule your appointment.  ? ?The patient verbalized understanding of instructions, educational materials, and care plan provided today and declined offer to receive copy of patient instructions, educational materials, and care plan.   ?

## 2021-09-23 ENCOUNTER — Telehealth: Payer: Self-pay | Admitting: Pharmacist

## 2021-09-23 NOTE — Telephone Encounter (Signed)
Patient can't tolerate ?Cancel novo nordisk patient assistance for rybelsus ?

## 2021-09-26 ENCOUNTER — Other Ambulatory Visit: Payer: Self-pay | Admitting: Family Medicine

## 2021-09-26 DIAGNOSIS — E1165 Type 2 diabetes mellitus with hyperglycemia: Secondary | ICD-10-CM

## 2021-09-29 ENCOUNTER — Ambulatory Visit: Payer: Medicare HMO | Admitting: Orthopedic Surgery

## 2021-10-05 DIAGNOSIS — E1169 Type 2 diabetes mellitus with other specified complication: Secondary | ICD-10-CM

## 2021-10-05 DIAGNOSIS — E785 Hyperlipidemia, unspecified: Secondary | ICD-10-CM

## 2021-10-05 DIAGNOSIS — E1152 Type 2 diabetes mellitus with diabetic peripheral angiopathy with gangrene: Secondary | ICD-10-CM

## 2021-10-06 ENCOUNTER — Ambulatory Visit (INDEPENDENT_AMBULATORY_CARE_PROVIDER_SITE_OTHER): Payer: Medicare HMO | Admitting: Family Medicine

## 2021-10-06 ENCOUNTER — Encounter: Payer: Self-pay | Admitting: Family Medicine

## 2021-10-06 ENCOUNTER — Ambulatory Visit (HOSPITAL_COMMUNITY): Admission: RE | Admit: 2021-10-06 | Payer: Medicare HMO | Source: Ambulatory Visit

## 2021-10-06 VITALS — BP 134/73 | HR 76 | Temp 98.0°F | Ht 70.0 in | Wt 229.0 lb

## 2021-10-06 DIAGNOSIS — E1122 Type 2 diabetes mellitus with diabetic chronic kidney disease: Secondary | ICD-10-CM | POA: Diagnosis not present

## 2021-10-06 DIAGNOSIS — E1152 Type 2 diabetes mellitus with diabetic peripheral angiopathy with gangrene: Secondary | ICD-10-CM

## 2021-10-06 DIAGNOSIS — E1169 Type 2 diabetes mellitus with other specified complication: Secondary | ICD-10-CM | POA: Diagnosis not present

## 2021-10-06 DIAGNOSIS — T8740 Infection of amputation stump, unspecified extremity: Secondary | ICD-10-CM | POA: Diagnosis not present

## 2021-10-06 DIAGNOSIS — Z89511 Acquired absence of right leg below knee: Secondary | ICD-10-CM | POA: Diagnosis not present

## 2021-10-06 DIAGNOSIS — G720 Drug-induced myopathy: Secondary | ICD-10-CM

## 2021-10-06 DIAGNOSIS — I739 Peripheral vascular disease, unspecified: Secondary | ICD-10-CM | POA: Diagnosis not present

## 2021-10-06 DIAGNOSIS — R29898 Other symptoms and signs involving the musculoskeletal system: Secondary | ICD-10-CM | POA: Diagnosis not present

## 2021-10-06 DIAGNOSIS — I129 Hypertensive chronic kidney disease with stage 1 through stage 4 chronic kidney disease, or unspecified chronic kidney disease: Secondary | ICD-10-CM

## 2021-10-06 DIAGNOSIS — E669 Obesity, unspecified: Secondary | ICD-10-CM

## 2021-10-06 DIAGNOSIS — E785 Hyperlipidemia, unspecified: Secondary | ICD-10-CM | POA: Diagnosis not present

## 2021-10-06 DIAGNOSIS — T466X5A Adverse effect of antihyperlipidemic and antiarteriosclerotic drugs, initial encounter: Secondary | ICD-10-CM

## 2021-10-06 LAB — BAYER DCA HB A1C WAIVED: HB A1C (BAYER DCA - WAIVED): 7.3 % — ABNORMAL HIGH (ref 4.8–5.6)

## 2021-10-06 MED ORDER — DOXYCYCLINE HYCLATE 100 MG PO TABS: 100.0000 mg | ORAL_TABLET | Freq: Two times a day (BID) | ORAL | 0 refills | Status: DC

## 2021-10-06 MED ORDER — TRESIBA FLEXTOUCH 100 UNIT/ML ~~LOC~~ SOPN: 10.0000 [IU] | PEN_INJECTOR | Freq: Every day | SUBCUTANEOUS | 0 refills | Status: DC

## 2021-10-06 MED ORDER — ACCU-CHEK SOFTCLIX LANCETS MISC: 3 refills | Status: AC

## 2021-10-06 MED ORDER — ACCU-CHEK GUIDE VI STRP: ORAL_STRIP | 3 refills | Status: AC

## 2021-10-06 MED ORDER — NOVOFINE PLUS PEN NEEDLE 32G X 4 MM MISC: 3 refills | Status: DC

## 2021-10-06 NOTE — Progress Notes (Signed)
Subjective: CC:DM PCP: Raliegh Ip, DO [id number] [name] is a 61 y.o. female presenting to clinic today for:  1. Type 2 Diabetes with hypertension, hyperlipidemia, PAD and h/o RLE amputation:  She notes the Ozempic and Rybelsus made her violently ill so she was subsequently placed back on Amaryl 4mg  and continued on Metformin.  She notes that blood sugars have remained in the 160s to low 200s.  She has had no hypoglycemic episodes.  She did not find that the blood sugars went down with either the GLP's either.  Last eye exam: UTD Last foot exam: needs Last A1c:  Lab Results  Component Value Date   HGBA1C 9.2 (H) 07/08/2021   Nephropathy screen indicated?: on ARB Last flu, zoster and/or pneumovax:  Immunization History  Administered Date(s) Administered   PFIZER(Purple Top)SARS-COV-2 Vaccination 01/25/2020, 02/15/2020   Td 06/08/2005    ROS: Reports some drainage from the right stump.  Using Bactroban and nitroglycerin patches but concerned that she has yet another soft tissue infection.  She is now status post 3 revisions to the right stump.  2.  Hand numbness and tingling Patient reports that she had total loss of function of the right hand starting Thursday after she woke up from a nap.  She notes that she had a numbness and tingling sensation in that hand since.  Points to all fingers on the palmar side of the hand on the right.  She can only barely move the hand at the wrist and she notes that this is an improvement after using a wrist splint.  Denies any weakness, sensory changes, visual disturbances or balance issues elsewhere.    ROS: Per HPI  Allergies  Allergen Reactions   Aleve [Naproxen Sodium] Hives and Itching   Cortisone Swelling    Internal organs   Ozempic (0.25 Or 0.5 Mg-Dose) [Semaglutide(0.25 Or 0.5mg -Dos)] Diarrhea and Nausea Only   Prednisone Swelling    Makes internal organs swell   Statins Other (See Comments)    Myalgia even with  Pravastatin   Vancomycin Itching   Janumet [Sitagliptin-Metformin Hcl]     Nausea/diarrhea    Semaglutide Diarrhea and Nausea And Vomiting    RYBELSUS --PATIENT CAN'Pam TOLERATE 3MG  DAILY   Past Medical History:  Diagnosis Date   Abscess 01/07/2021   Acute blood loss anemia    Arthritis    COPD (chronic obstructive pulmonary disease) (HCC)    Deep vein thrombosis (DVT) of both lower extremities (HCC) 01/05/2017   Depression    DM (diabetes mellitus) (HCC)    DVT (deep venous thrombosis) (HCC)    x5   Family history of colon cancer    GERD (gastroesophageal reflux disease)    Hyperlipemia    Hypertension    Hypothyroid    Mild protein-calorie malnutrition (HCC)    Obesity    Osteomyelitis of great toe of right foot (HCC) 07/04/2018   PVD (peripheral vascular disease) (HCC)    S/P unilateral BKA (below knee amputation), right (HCC)    Type 2 diabetes mellitus with diabetic peripheral angiopathy and gangrene, without long-term current use of insulin (HCC) 12/27/2018   Unilateral complete BKA, right, initial encounter (HCC) 07/11/2018   Unilateral complete BKA, right, subsequent encounter (HCC)    Uterine cancer (HCC)    UTI (urinary tract infection)    Wound dehiscence 01/24/2021    Current Outpatient Medications:    Accu-Chek Softclix Lancets lancets, Use as instructed to test BGs 1-2 times daily E11.52, Disp: 100 each,  Rfl: 12   ascorbic acid (VITAMIN C) 500 MG tablet, Take 500 mg by mouth daily., Disp: , Rfl:    aspirin EC 81 MG EC tablet, Take 1 tablet (81 mg total) by mouth daily., Disp: , Rfl:    Cholecalciferol (VITAMIN D3) 5000 units TABS, Take 5,000 Units by mouth daily., Disp: , Rfl:    clopidogrel (PLAVIX) 75 MG tablet, TAKE 1 TABLET DAILY, Disp: 90 tablet, Rfl: 0   diclofenac Sodium (VOLTAREN) 1 % GEL, APPLY 2 GRAMS UP TO 4 TIMES A DAY AS NEEDED, Disp: 300 g, Rfl: 1   escitalopram (LEXAPRO) 10 MG tablet, TAKE 1 TABLET DAILY, Disp: 90 tablet, Rfl: 0   ezetimibe (ZETIA)  10 MG tablet, TAKE 1 TABLET DAILY, Disp: 90 tablet, Rfl: 0   furosemide (LASIX) 20 MG tablet, TAKE 1 TABLET DAILY, Disp: 90 tablet, Rfl: 0   gabapentin (NEURONTIN) 300 MG capsule, TAKE 1 CAPSULE AT BEDTIME, Disp: 30 capsule, Rfl: 2   glimepiride (AMARYL) 4 MG tablet, Take 1 tablet (4 mg total) by mouth daily before breakfast., Disp: 30 tablet, Rfl: 3   glucose blood (ACCU-CHEK GUIDE) test strip, Check BS BID Dx E11.52, Disp: 200 strip, Rfl: 3   levothyroxine (SYNTHROID) 100 MCG tablet, Take 1 tablet (100 mcg total) by mouth daily before breakfast., Disp: 90 tablet, Rfl: 3   losartan (COZAAR) 50 MG tablet, TAKE ONE TABLET BY MOUTH EVERY DAY, Disp: 90 tablet, Rfl: 0   metFORMIN (GLUCOPHAGE-XR) 500 MG 24 hr tablet, TAKE TWO TABLETS IN THE MORNING AND AT BEDTIME, Disp: 360 tablet, Rfl: 0   nitroGLYCERIN (NITRODUR - DOSED IN MG/24 HR) 0.2 mg/hr patch, Place 1 patch (0.2 mg total) onto the skin daily., Disp: 30 patch, Rfl: 12   pantoprazole (PROTONIX) 40 MG tablet, TAKE 1 TABLET DAILY, Disp: 90 tablet, Rfl: 0   Potassium 99 MG TABS, Take 99 mg by mouth in the morning., Disp: , Rfl:    rivaroxaban (XARELTO) 20 MG TABS tablet, Take 1 tablet (20 mg total) by mouth daily with supper. (Patient taking differently: Take 20 mg by mouth in the morning.), Disp: 90 tablet, Rfl: 3   sennosides-docusate sodium (SENOKOT-S) 8.6-50 MG tablet, Take 1 tablet by mouth in the morning., Disp: , Rfl:    Tiotropium Bromide Monohydrate (SPIRIVA RESPIMAT) 2.5 MCG/ACT AERS, Inhale 2 puffs into the lungs daily. (Patient taking differently: Inhale 2 puffs into the lungs 3 (three) times a week.), Disp: 8 g, Rfl: 0   tretinoin (RETIN-A) 0.05 % cream, APPLY TO AFFECTED AREAS AT BEDTIME, Disp: 45 g, Rfl: 3   triamcinolone cream (KENALOG) 0.1 %, Apply topically 2 (two) times daily for up to 10 days per flare (Patient taking differently: Apply 1 application topically 2 (two) times daily as needed (skin rash/irritation.).), Disp: 30 g, Rfl:  0 Social History   Socioeconomic History   Marital status: Divorced    Spouse name: Danny   Number of children: 1   Years of education: 16   Highest education level: Manufacturing engineer (e.g., MA, MS, MEng, MEd, MSW, MBA)  Occupational History   Occupation: Magazine features editor: Sports coach   Occupation: retired  Tobacco Use   Smoking status: Every Day    Packs/day: 1.00    Years: 38.00    Pack years: 38.00    Types: Cigarettes   Smokeless tobacco: Never  Vaping Use   Vaping Use: Never used  Substance and Sexual Activity   Alcohol use: No  Alcohol/week: 0.0 standard drinks   Drug use: Yes    Frequency: 4.0 times per week    Types: Marijuana   Sexual activity: Not Currently    Birth control/protection: Surgical  Other Topics Concern   Not on file  Social History Narrative   Lives alone in one bedroom apartment - has a ramp outside - no stairs -handicap accessible bathroom   R BKA, but has prosthetic leg she uses to walk, drive, etc   Lots of family nearby, great relationships - lots of help when needed   Social Determinants of Health   Financial Resource Strain: Low Risk    Difficulty of Paying Living Expenses: Not hard at all  Food Insecurity: No Food Insecurity   Worried About Programme researcher, broadcasting/film/video in the Last Year: Never true   Ran Out of Food in the Last Year: Never true  Transportation Needs: No Transportation Needs   Lack of Transportation (Medical): No   Lack of Transportation (Non-Medical): No  Physical Activity: Inactive   Days of Exercise per Week: 0 days   Minutes of Exercise per Session: 0 min  Stress: Not on file  Social Connections: Moderately Integrated   Frequency of Communication with Friends and Family: More than three times a week   Frequency of Social Gatherings with Friends and Family: More than three times a week   Attends Religious Services: 1 to 4 times per year   Active Member of [employer] or Organizations: Yes   Attends Tax inspector Meetings: 1 to 4 times per year   Marital Status: Divorced  Catering manager Violence: Not At Risk   Fear of Current or Ex-Partner: No   Emotionally Abused: No   Physically Abused: No   Sexually Abused: No   Family History  Problem Relation Age of Onset   Colon cancer Brother 29   Hypertension Mother    Hypothyroidism Mother    Heart disease Father    Diabetes Father    Hyperlipidemia Father    Clotting disorder Brother    Diabetes Brother    Heart disease Brother     Objective: Office vital signs reviewed. BP 134/73   Pulse 76   Temp 98 F (36.7 C)   Ht 5\' 10"  (1.778 m)   Wt 229 lb (103.9 kg)   SpO2 94%   BMI 32.86 kg/m   Physical Examination:  General: Awake, alert, nontoxic female, No acute distress HEENT: PERRLA, EOMI, speech normal.  No facial drooping Cardio: regular rate and rhythm, S1S2 heard, no murmurs appreciated Pulm: Normal work of breathing on room air Neuro: Cranial nerves II through XII grossly intact.  Alert and oriented.  She has decreased light touch sensation throughout the entire right hand.  She has a visibly reduced strength and function in that right hand but is able to move the right upper extremity without difficulty.  Grip strength is decreased on the right compared to the left.  Assessment/ Plan: 61 y.o. female   Diabetes mellitus type 2 in obese (HCC) - Plan: Bayer DCA Hb A1c Waived, insulin degludec (TRESIBA FLEXTOUCH) 100 UNIT/ML FlexTouch Pen, Insulin Pen Needle (NOVOFINE PLUS PEN NEEDLE) 32G X 4 MM MISC  Hyperlipidemia associated with type 2 diabetes mellitus (HCC) - Plan: CMP14+EGFR  Statin myopathy  Hypertension associated with chronic kidney disease due to type 2 diabetes mellitus (HCC) - Plan: CMP14+EGFR  Peripheral vascular insufficiency (HCC) - Plan: CBC with Differential  Acquired absence of right leg below knee (HCC) -  Plan: CBC with Differential, doxycycline (VIBRA-TABS) 100 MG tablet  Amputation stump  infection (HCC) - Plan: doxycycline (VIBRA-TABS) 100 MG tablet  Right hand weakness - Plan: MR Brain Wo Contrast  Sugar still not controlled but better than previous at 7.3.  I worry about her having some progression of her diabetes such that she is not really responding to oral therapies anymore.  I think the biggest change in her diabetes compared to previous checkup is likely secondary to her not being able to intake any food because her stomach was so upset with the GLP.  Check liver function, kidney function.  Unable to tolerate statins so continue Zetia.  Blood pressure is at goal.  No changes  Still with no active drainage but she reports some purulent drainage this morning.  Doxycycline renewed.  Follow-up with Dr. Lajoyce Corners  I am quite concerned that she may have sustained some type of stroke or TIA based on the right hand weakness and sensory changes that was abrupt onset.  She has multiple risk factors including tobacco use, hyperlipidemia, hypertension and uncontrolled diabetes as well as vascular disease.  MRI of the brain stat ordered further work-up pending result  No orders of the defined types were placed in this encounter.  No orders of the defined types were placed in this encounter.    Raliegh Ip, DO [facility] [phone]

## 2021-10-06 NOTE — Addendum Note (Signed)
Addended by: Janora Norlander on: 10/06/2021 03:14 PM ? ? Modules accepted: Orders ? ?

## 2021-10-06 NOTE — Patient Instructions (Signed)
HOLD glimeperide. ?CONTINUE Metformin ?START Tyler Aas ? ?If your FASTING blood sugar is above 150 for 2 consecutive days, increase by 1 unit of your long acting insulin. ? ?Example: ?Day 1: Blood sugar was 159 ?Day 2: Blood sugar was 205 ? Increase tresiba to 11 units ?Day 3: Blood sugar is 202 ?Day 4: Blood sugars 168 ? Increase tresiba to 12 units ?Day 5: Blood sugar is 105 ?Day 6: Blood sugars 145 ? Stick with 23 units.  Do not increase. ? ?

## 2021-10-07 ENCOUNTER — Other Ambulatory Visit: Payer: Self-pay | Admitting: Family Medicine

## 2021-10-07 ENCOUNTER — Ambulatory Visit (HOSPITAL_COMMUNITY)
Admission: RE | Admit: 2021-10-07 | Discharge: 2021-10-07 | Disposition: A | Discharge status: Home / Self Care | Payer: Medicare HMO | Source: Ambulatory Visit | Attending: Family Medicine | Admitting: Family Medicine

## 2021-10-07 DIAGNOSIS — R29818 Other symptoms and signs involving the nervous system: Secondary | ICD-10-CM | POA: Diagnosis not present

## 2021-10-07 DIAGNOSIS — R29898 Other symptoms and signs involving the musculoskeletal system: Secondary | ICD-10-CM

## 2021-10-07 DIAGNOSIS — R29810 Facial weakness: Secondary | ICD-10-CM | POA: Diagnosis not present

## 2021-10-07 DIAGNOSIS — I6782 Cerebral ischemia: Secondary | ICD-10-CM | POA: Diagnosis not present

## 2021-10-07 LAB — CBC WITH DIFFERENTIAL/PLATELET
Basophils Absolute: 0.1 10*3/uL (ref 0.0–0.2)
Basos: 1 %
EOS (ABSOLUTE): 0.4 10*3/uL (ref 0.0–0.4)
Eos: 3 %
Hematocrit: 41.3 % (ref 34.0–46.6)
Hemoglobin: 13 g/dL (ref 11.1–15.9)
Immature Grans (Abs): 0 10*3/uL (ref 0.0–0.1)
Immature Granulocytes: 0 %
Lymphocytes Absolute: 4.8 10*3/uL — ABNORMAL HIGH (ref 0.7–3.1)
Lymphs: 34 %
MCH: 23.9 pg — ABNORMAL LOW (ref 26.6–33.0)
MCHC: 31.5 g/dL (ref 31.5–35.7)
MCV: 76 fL — ABNORMAL LOW (ref 79–97)
Monocytes Absolute: 0.7 10*3/uL (ref 0.1–0.9)
Monocytes: 5 %
Neutrophils Absolute: 8.1 10*3/uL — ABNORMAL HIGH (ref 1.4–7.0)
Neutrophils: 57 %
Platelets: 372 10*3/uL (ref 150–450)
RBC: 5.43 x10E6/uL — ABNORMAL HIGH (ref 3.77–5.28)
RDW: 18.2 % — ABNORMAL HIGH (ref 11.7–15.4)
WBC: 14.1 10*3/uL — ABNORMAL HIGH (ref 3.4–10.8)

## 2021-10-07 LAB — CMP14+EGFR
ALT: 26 IU/L (ref 0–32)
AST: 31 IU/L (ref 0–40)
Albumin/Globulin Ratio: 1.1 — ABNORMAL LOW (ref 1.2–2.2)
Albumin: 3.8 g/dL (ref 3.8–4.9)
Alkaline Phosphatase: 60 IU/L (ref 44–121)
BUN/Creatinine Ratio: 13 (ref 9–23)
BUN: 10 mg/dL (ref 6–24)
Bilirubin Total: <0.2 mg/dL (ref 0.0–1.2)
CO2: 22 mmol/L (ref 20–29)
Calcium: 10.3 mg/dL — ABNORMAL HIGH (ref 8.7–10.2)
Chloride: 100 mmol/L (ref 96–106)
Creatinine, Ser: 0.75 mg/dL (ref 0.57–1.00)
Globulin, Total: 3.6 g/dL (ref 1.5–4.5)
Glucose: 104 mg/dL — ABNORMAL HIGH (ref 70–99)
Potassium: 4.4 mmol/L (ref 3.5–5.2)
Sodium: 140 mmol/L (ref 134–144)
Total Protein: 7.4 g/dL (ref 6.0–8.5)
eGFR: 92 mL/min/{1.73_m2} (ref >59)

## 2021-10-08 ENCOUNTER — Other Ambulatory Visit: Payer: Self-pay | Admitting: Family Medicine

## 2021-10-08 DIAGNOSIS — R29898 Other symptoms and signs involving the musculoskeletal system: Secondary | ICD-10-CM

## 2021-10-20 ENCOUNTER — Ambulatory Visit (INDEPENDENT_AMBULATORY_CARE_PROVIDER_SITE_OTHER): Payer: Medicare HMO | Admitting: Family Medicine

## 2021-10-20 DIAGNOSIS — E1169 Type 2 diabetes mellitus with other specified complication: Secondary | ICD-10-CM | POA: Diagnosis not present

## 2021-10-20 DIAGNOSIS — E669 Obesity, unspecified: Secondary | ICD-10-CM

## 2021-10-20 DIAGNOSIS — Z89511 Acquired absence of right leg below knee: Secondary | ICD-10-CM | POA: Diagnosis not present

## 2021-10-20 DIAGNOSIS — J310 Chronic rhinitis: Secondary | ICD-10-CM | POA: Diagnosis not present

## 2021-10-20 DIAGNOSIS — J329 Chronic sinusitis, unspecified: Secondary | ICD-10-CM | POA: Diagnosis not present

## 2021-10-20 MED ORDER — TRESIBA FLEXTOUCH 100 UNIT/ML ~~LOC~~ SOPN: 15.0000 [IU] | PEN_INJECTOR | Freq: Every day | SUBCUTANEOUS | 1 refills | Status: DC

## 2021-10-20 NOTE — Progress Notes (Signed)
Telephone visit ? ?Subjective: ?CC:DM ?PCP: Janora Norlander, DO ?[id number] T Huestis is a 61 y.o. female calls for telephone consult today. Patient provides verbal consent for consult held via phone. ? ?Due to COVID-19 pandemic this visit was conducted virtually. This visit type was conducted due to national recommendations for restrictions regarding the COVID-19 Pandemic (e.g. social distancing, sheltering in place) in an effort to limit this patient's exposure and mitigate transmission in our community. All issues noted in this document were discussed and addressed.  A physical exam was not performed with this format.  ? ?Location of patient: home ?Location of provider: [facility] ?Others present for call: none ? ?1. DM ?Patient started on Tresiba insulin last visit.  She has titrated up to 15 units of insulin.  This am BG was 234.  She has had some in the 100s and she is happy about this as her blood sugars have been the lowest they have been compared to prior to initiation of insulin.  She has had no hypoglycemic episodes.  She did forget to take the insulin 1 night but remember the following evening.  She ate egg rolls last night for mother's day.  She did not know that would affect her blood sugar to this degree.  She has now a scooter and her mobility has increased as a result.  She is thrilled about this.  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 ?Leg infection has improved and she has an appoint with Dr. Sharol Given tomorrow ? ?Is having some issues with sinusitis.  She initially had some clear phlegm production but now it starting to turn colors.  She has doxycycline on hand for her wound but was not sure if this would treat a sinus infection or not.  Also has Flonase at home.  She is been taking some Claritin-D and unfortunately this is caused to stimulation and she has not slept all night.  She is going to try to take a nap this afternoon ? ?ROS: Per HPI ? ?Allergies  ?Allergen Reactions  ? Aleve  [Naproxen Sodium] Hives and Itching  ? Cortisone Swelling  ?  Internal organs  ? Ozempic (0.25 Or 0.5 Mg-Dose) [Semaglutide(0.25 Or 0.'5mg'$ -Dos)] Diarrhea and Nausea Only  ? Prednisone Swelling  ?  Makes internal organs swell  ? Statins Other (See Comments)  ?  Myalgia even with Pravastatin  ? Vancomycin Itching  ? Janumet [Sitagliptin-Metformin Hcl]   ?  Nausea/diarrhea ?  ? Semaglutide Diarrhea and Nausea And Vomiting  ?  RYBELSUS --PATIENT CAN'T TOLERATE '3MG'$  DAILY  ? ?Past Medical History:  ?Diagnosis Date  ? Abscess 01/07/2021  ? Acute blood loss anemia   ? Arthritis   ? COPD (chronic obstructive pulmonary disease) ([place])   ? Deep vein thrombosis (DVT) of both lower extremities ([place]) 01/05/2017  ? Depression   ? DM (diabetes mellitus) ([place])   ? DVT (deep venous thrombosis) ([place])   ? x5  ? Family history of colon cancer   ? GERD (gastroesophageal reflux disease)   ? Hyperlipemia   ? Hypertension   ? Hypothyroid   ? Mild protein-calorie malnutrition ([place])   ? Obesity   ? Osteomyelitis of great toe of right foot ([place]) 07/04/2018  ? PVD (peripheral vascular disease) ([place])   ? S/P unilateral BKA (below knee amputation), right ([place])   ? Type 2 diabetes mellitus with diabetic peripheral angiopathy and gangrene, without long-term current use of insulin ([place]) 12/27/2018  ? Unilateral complete BKA,  right, initial encounter ([place]) 07/11/2018  ? Unilateral complete BKA, right, subsequent encounter ([place])   ? Uterine cancer ([place])   ? UTI (urinary tract infection)   ? Wound dehiscence 01/24/2021  ? ? ?Current Outpatient Medications:  ?  Accu-Chek Softclix Lancets lancets, Use as instructed to test BGs up to 3 times daily E11.52, Disp: 300 each, Rfl: 3 ?  ascorbic acid (VITAMIN C) 500 MG tablet, Take 500 mg by mouth daily., Disp: , Rfl:  ?  aspirin EC 81 MG EC tablet, Take 1 tablet (81 mg total) by mouth daily., Disp: , Rfl:  ?  Cholecalciferol (VITAMIN D3) 5000 units TABS, Take 5,000 Units by mouth daily., Disp: , Rfl:  ?  clopidogrel  (PLAVIX) 75 MG tablet, TAKE 1 TABLET DAILY, Disp: 90 tablet, Rfl: 0 ?  diclofenac Sodium (VOLTAREN) 1 % GEL, APPLY 2 GRAMS UP TO 4 TIMES A DAY AS NEEDED, Disp: 300 g, Rfl: 1 ?  doxycycline (VIBRA-TABS) 100 MG tablet, Take 1 tablet (100 mg total) by mouth 2 (two) times daily., Disp: 60 tablet, Rfl: 0 ?  escitalopram (LEXAPRO) 10 MG tablet, TAKE 1 TABLET DAILY, Disp: 90 tablet, Rfl: 0 ?  ezetimibe (ZETIA) 10 MG tablet, TAKE 1 TABLET DAILY, Disp: 90 tablet, Rfl: 0 ?  furosemide (LASIX) 20 MG tablet, TAKE 1 TABLET DAILY, Disp: 90 tablet, Rfl: 0 ?  gabapentin (NEURONTIN) 300 MG capsule, TAKE 1 CAPSULE AT BEDTIME, Disp: 30 capsule, Rfl: 2 ?  glimepiride (AMARYL) 4 MG tablet, Take 1 tablet (4 mg total) by mouth daily before breakfast., Disp: 30 tablet, Rfl: 3 ?  glucose blood (ACCU-CHEK GUIDE) test strip, Check BS up to 3 times daily Dx E11.52, Disp: 300 strip, Rfl: 3 ?  insulin degludec (TRESIBA FLEXTOUCH) 100 UNIT/ML FlexTouch Pen, Inject 10 Units into the skin at bedtime., Disp: 9 mL, Rfl: 0 ?  Insulin Pen Needle (NOVOFINE PLUS PEN NEEDLE) 32G X 4 MM MISC, UAD with tresiba E11.9, Disp: 100 each, Rfl: 3 ?  levothyroxine (SYNTHROID) 100 MCG tablet, Take 1 tablet (100 mcg total) by mouth daily before breakfast., Disp: 90 tablet, Rfl: 3 ?  losartan (COZAAR) 50 MG tablet, TAKE ONE TABLET BY MOUTH EVERY DAY, Disp: 90 tablet, Rfl: 0 ?  metFORMIN (GLUCOPHAGE-XR) 500 MG 24 hr tablet, TAKE TWO TABLETS IN THE MORNING AND AT BEDTIME, Disp: 360 tablet, Rfl: 0 ?  nitroGLYCERIN (NITRODUR - DOSED IN MG/24 HR) 0.2 mg/hr patch, Place 1 patch (0.2 mg total) onto the skin daily., Disp: 30 patch, Rfl: 12 ?  pantoprazole (PROTONIX) 40 MG tablet, TAKE 1 TABLET DAILY, Disp: 90 tablet, Rfl: 0 ?  Potassium 99 MG TABS, Take 99 mg by mouth in the morning., Disp: , Rfl:  ?  rivaroxaban (XARELTO) 20 MG TABS tablet, Take 1 tablet (20 mg total) by mouth daily with supper. (Patient taking differently: Take 20 mg by mouth in the morning.), Disp: 90  tablet, Rfl: 3 ?  sennosides-docusate sodium (SENOKOT-S) 8.6-50 MG tablet, Take 1 tablet by mouth in the morning., Disp: , Rfl:  ?  Tiotropium Bromide Monohydrate (SPIRIVA RESPIMAT) 2.5 MCG/ACT AERS, Inhale 2 puffs into the lungs daily. (Patient taking differently: Inhale 2 puffs into the lungs 3 (three) times a week.), Disp: 8 g, Rfl: 0 ?  tretinoin (RETIN-A) 0.05 % cream, APPLY TO AFFECTED AREAS AT BEDTIME, Disp: 45 g, Rfl: 3 ?  triamcinolone cream (KENALOG) 0.1 %, Apply topically 2 (two) times daily for up to 10 days per flare (Patient taking  differently: Apply 1 application. topically 2 (two) times daily as needed (skin rash/irritation.).), Disp: 30 g, Rfl: 0 ? ?Assessment/ Plan: ?61 y.o. female  ? ?Diabetes mellitus type 2 in obese ([place]) - Plan: insulin degludec (TRESIBA FLEXTOUCH) 100 UNIT/ML FlexTouch Pen ? ?Acquired absence of right leg below knee (HCC) ? ?Rhinosinusitis ? ?Sound like sugars improving.  Continue titrating up by 1 unit every 2 days for fasting blood sugar above 150.  New Rx sent with adjusted directions ? ?With regards to her acquired absence of the right leg it sounds like her infection has improved with doxycycline.  Keep appointment with Ortho ? ?Okay to use Flonase.  If she is having purulent material from the nares okay to proceed with Doxy twice daily for 7 to 10 days ? ?Start time: 1:57pm ?End time: 2:10pm ? ?Total time spent on patient care (including telephone call/ virtual visit): 13 minutes ? ?Janora Norlander, DO ?[place] ?[phone] ? ? ?

## 2021-10-21 ENCOUNTER — Ambulatory Visit (INDEPENDENT_AMBULATORY_CARE_PROVIDER_SITE_OTHER): Payer: Medicare HMO

## 2021-10-21 ENCOUNTER — Ambulatory Visit: Payer: Medicare HMO | Admitting: Orthopedic Surgery

## 2021-10-21 ENCOUNTER — Other Ambulatory Visit: Payer: Self-pay | Admitting: Family Medicine

## 2021-10-21 DIAGNOSIS — M542 Cervicalgia: Secondary | ICD-10-CM

## 2021-10-21 DIAGNOSIS — Z89511 Acquired absence of right leg below knee: Secondary | ICD-10-CM | POA: Diagnosis not present

## 2021-11-07 ENCOUNTER — Other Ambulatory Visit: Payer: Self-pay | Admitting: Family Medicine

## 2021-11-07 DIAGNOSIS — F339 Major depressive disorder, recurrent, unspecified: Secondary | ICD-10-CM

## 2021-11-07 DIAGNOSIS — K219 Gastro-esophageal reflux disease without esophagitis: Secondary | ICD-10-CM

## 2021-11-11 ENCOUNTER — Encounter: Payer: Self-pay | Admitting: Orthopedic Surgery

## 2021-11-11 NOTE — Progress Notes (Signed)
Office Visit Note   Patient: [name]           Date of Birth: 1960/12/16           MRN: [id number] Visit Date: 10/21/2021              Requested by: Janora Norlander, DO [place],  [place] [address] PCP: Janora Norlander, DO  Chief Complaint  Patient presents with   Right Hand - Weakness   Right Leg - Follow-up    01/23/2021 BKA revision       HPI: Patient is a 61 year old woman who presents with right hand weakness and follow-up for below-knee amputation.  Patient just recently has had an MRI scan of her brain which showed no infarct hemorrhage or mass.  Patient states she scratched her hand several months ago and has tingling in the little finger.  She states then she could not move her hand or wrist.  Assessment & Plan: Visit Diagnoses:  1. Neck pain     Plan: Patient will continue with Tylenol and anti-inflammatories as needed follow-up in 4 weeks.  Follow-Up Instructions: Return in about 4 weeks (around 11/18/2021).   Ortho Exam  Patient is alert, oriented, no adenopathy, well-dressed, normal affect, normal respiratory effort. Examination patient has full range of motion of her fingers and wrist.  Patient states she still has tingling in the little finger.  Patient has no focal motor weakness at this time good grip strength.  Patient states that she has had organ swelling from prednisone and did not want to consider prednisone.  Patient has no carpal tunnel symptoms no radicular symptoms.  Imaging: No results found. No images are attached to the encounter.  Labs: Lab Results  Component Value Date   HGBA1C 7.3 (H) 10/06/2021   HGBA1C 9.2 (H) 07/08/2021   HGBA1C 7.5 (H) 04/07/2021   ESRSEDRATE 19 10/25/2012   CRP 0.6 10/25/2012   LABURIC 5.7 12/10/2006   REPTSTATUS 07/09/2018 FINAL 07/04/2018   CULT NO GROWTH 5 DAYS 07/04/2018     Lab Results  Component Value Date   ALBUMIN 3.8 10/06/2021   ALBUMIN 3.6 07/19/2020   ALBUMIN 3.7  (L) 06/26/2020    Lab Results  Component Value Date   MG 1.7 07/08/2018   Lab Results  Component Value Date   VD25OH 67.6 03/29/2019   VD25OH 72.2 12/27/2018   VD25OH 56.8 11/09/2018    No results found for: PREALBUMIN    Latest Ref Rng & Units 10/06/2021    2:20 PM 01/24/2021   10:30 AM 07/19/2020    1:24 PM  CBC EXTENDED  WBC 3.4 - 10.8 x10E3/uL 14.1   15.0   10.5    RBC 3.77 - 5.28 x10E6/uL 5.43   5.28   5.35    Hemoglobin 11.1 - 15.9 g/dL 13.0   15.1   15.5    HCT 34.0 - 46.6 % 41.3   46.2   47.7    Platelets 150 - 450 x10E3/uL 372   263   267    NEUT# 1.4 - 7.0 x10E3/uL 8.1    5.3    Lymph# 0.7 - 3.1 x10E3/uL 4.8    3.9       There is no height or weight on file to calculate BMI.  Orders:  Orders Placed This Encounter  Procedures   XR Cervical Spine 2 or 3 views   No orders of the defined types were placed in this encounter.  Procedures: No procedures performed  Clinical Data: No additional findings.  ROS:  All other systems negative, except as noted in the HPI. Review of Systems  Objective: Vital Signs: There were no vitals taken for this visit.  Specialty Comments:  No specialty comments available.  PMFS History: Patient Active Problem List   Diagnosis Date Noted   Dehiscence of amputation stump ([place])    History of DVT (deep vein thrombosis) 07/03/2019   Statin intolerance 04/19/2019   Vitamin D deficiency 12/27/2018   DVT of axillary vein, chronic, bilateral ([place]) 12/27/2018   Hypertension associated with chronic kidney disease due to type 2 diabetes mellitus ([place]) 12/27/2018   Long term current use of anticoagulant 12/27/2018   Medically noncompliant 12/27/2018   Leukocytosis    Diabetes mellitus type 2 in obese [facility])    Acquired absence of right leg below knee ([place]) 07/08/2018   Intermittent claudication ([place]) 05/11/2017   Peripheral vascular insufficiency ([place]) 09/25/2015   Hypothyroidism 01/29/2014   Impetigo 07/28/2013   Chronic  obstructive pulmonary disease ([place])    Malignant neoplasm of uterus ([place])    GERD (gastroesophageal reflux disease) 10/18/2012   Hernia, hiatal 10/18/2012   Hyperlipidemia associated with type 2 diabetes mellitus ([place]) 04/18/2008   SMOKER 04/18/2008   Past Medical History:  Diagnosis Date   Abscess 01/07/2021   Acute blood loss anemia    Arthritis    COPD (chronic obstructive pulmonary disease) (H[facility])    Deep vein thrombosis (DVT) of both lower extremities ([place]) 01/05/2017   Depression    DM (diabetes mellitus) (H[facility])    DVT (deep venous thrombosis) (H[facility])    x5   Family history of colon cancer    GERD (gastroesophageal reflux disease)    Hyperlipemia    Hypertension    Hypothyroid    Mild protein-calorie malnutrition (H[facility])    Obesity    Osteomyelitis of great toe of right foot ([place]) 07/04/2018   PVD (peripheral vascular disease) (H[facility])    S/P unilateral BKA (below knee amputation), right (H[facility])    Type 2 diabetes mellitus with diabetic peripheral angiopathy and gangrene, without long-term current use of insulin ([place]) 12/27/2018   Unilateral complete BKA, right, initial encounter ([place]) 07/11/2018   Unilateral complete BKA, right, subsequent encounter ([place])    Uterine cancer ([place])    UTI (urinary tract infection)    Wound dehiscence 01/24/2021    Family History  Problem Relation Age of Onset   Colon cancer Brother 62   Hypertension Mother    Hypothyroidism Mother    Heart disease Father    Diabetes Father    Hyperlipidemia Father    Clotting disorder Brother    Diabetes Brother    Heart disease Brother     Past Surgical History:  Procedure Laterality Date   ABDOMINAL HYSTERECTOMY     AMPUTATION Right 07/08/2018   Procedure: RIGHT BELOW KNEE AMPUTATION;  Surgeon: [name], MD;  Location: [facility];  Service: Orthopedics;  Laterality: Right;   CESAREAN SECTION     FEMORAL BYPASS     x 5   LUMBAR DISC SURGERY     L4-L5   STUMP REVISION Right 01/24/2021   Procedure: right  below the knee amputation revision;  Surgeon: [name], MD;  Location: [facility];  Service: Orthopedics;  Laterality: Right;   TOE AMPUTATION     right   TONSILLECTOMY     Social History   Occupational History   Occupation: Product manager:  [employer]   Occupation: retired  Tobacco Use   Smoking status: Every Day    Packs/day: 1.00    Years: 38.00    Pack years: 38.00    Types: Cigarettes   Smokeless tobacco: Never  Vaping Use   Vaping Use: Never used  Substance and Sexual Activity   Alcohol use: No    Alcohol/week: 0.0 standard drinks   Drug use: Yes    Frequency: 4.0 times per week    Types: Marijuana   Sexual activity: Not Currently    Birth control/protection: Surgical

## 2021-11-24 ENCOUNTER — Other Ambulatory Visit: Payer: Self-pay | Admitting: Family Medicine

## 2021-11-24 DIAGNOSIS — E1152 Type 2 diabetes mellitus with diabetic peripheral angiopathy with gangrene: Secondary | ICD-10-CM

## 2021-11-27 ENCOUNTER — Other Ambulatory Visit: Payer: Self-pay | Admitting: Family Medicine

## 2021-11-28 ENCOUNTER — Telehealth: Payer: Medicare HMO

## 2021-12-10 ENCOUNTER — Other Ambulatory Visit: Payer: Self-pay | Admitting: Family Medicine

## 2021-12-10 DIAGNOSIS — R21 Rash and other nonspecific skin eruption: Secondary | ICD-10-CM

## 2021-12-10 NOTE — Telephone Encounter (Signed)
[facility] patient Last office visit 10/20/21 Last refill 02/16/20, 30 grams, no refill

## 2021-12-24 ENCOUNTER — Telehealth: Payer: Self-pay | Admitting: Family Medicine

## 2021-12-24 ENCOUNTER — Telehealth: Payer: Medicare HMO

## 2021-12-24 DIAGNOSIS — E1169 Type 2 diabetes mellitus with other specified complication: Secondary | ICD-10-CM

## 2021-12-24 NOTE — Telephone Encounter (Signed)
lmtcb

## 2021-12-24 NOTE — Telephone Encounter (Signed)
Patient said she needs a refill on her insulin, said she has went from '10mg'$  to '40mg'$  because she was told to increase every other day. She asked if she could get a phone visit to discuss this with Dr Lajuana Ripple, only has four days left at this time.

## 2021-12-25 ENCOUNTER — Telehealth: Payer: Self-pay | Admitting: Family Medicine

## 2021-12-25 NOTE — Telephone Encounter (Signed)
Patient returning call. Please call back

## 2021-12-26 MED ORDER — TRESIBA FLEXTOUCH 100 UNIT/ML ~~LOC~~ SOPN: 40.0000 [IU] | PEN_INJECTOR | Freq: Every day | SUBCUTANEOUS | 3 refills | Status: DC

## 2021-12-26 NOTE — Telephone Encounter (Signed)
Lm making pt aware of dr g recommendations

## 2021-12-26 NOTE — Addendum Note (Signed)
Addended by: Janora Norlander on: 12/26/2021 09:04 AM   Modules accepted: Orders

## 2021-12-26 NOTE — Telephone Encounter (Signed)
New rx sent.  Continue titrating insulin as directed until AM sugars are consistently below 150 fasting.

## 2021-12-29 ENCOUNTER — Other Ambulatory Visit: Payer: Self-pay | Admitting: Family Medicine

## 2021-12-29 DIAGNOSIS — E1165 Type 2 diabetes mellitus with hyperglycemia: Secondary | ICD-10-CM

## 2021-12-30 ENCOUNTER — Ambulatory Visit (INDEPENDENT_AMBULATORY_CARE_PROVIDER_SITE_OTHER): Payer: Medicare HMO | Admitting: Pharmacist

## 2021-12-30 DIAGNOSIS — E1169 Type 2 diabetes mellitus with other specified complication: Secondary | ICD-10-CM

## 2021-12-30 DIAGNOSIS — G720 Drug-induced myopathy: Secondary | ICD-10-CM

## 2021-12-30 DIAGNOSIS — Z789 Other specified health status: Secondary | ICD-10-CM

## 2021-12-31 ENCOUNTER — Other Ambulatory Visit: Payer: Self-pay | Admitting: Family Medicine

## 2021-12-31 NOTE — Patient Instructions (Addendum)
Visit Information  Following are the goals we discussed today:  Current Barriers:  Unable to independently afford treatment regimen Unable to achieve control of T2DM  Suboptimal therapeutic regimen for T2DM  Pharmacist Clinical Goal(s):  patient will verbalize ability to afford treatment regimen achieve control of T2DM as evidenced by GOAL A1C through collaboration with PharmD and provider.   Interventions: 1:1 collaboration with Janora Norlander, DO regarding development and update of comprehensive plan of care as evidenced by provider attestation and co-signature Inter-disciplinary care team collaboration (see longitudinal plan of care) Comprehensive medication review performed; medication list updated in electronic medical record  Diabetes: Goal on Track (progressing): YES. Uncontrolled--A1C 9.2-->improved to 7.3%; current treatment: Tresiba 40-60 units daily (new start in the past month) ; Metformin, glimepiride per patient preference Intolerances:  Ozempic, Rybelsus (GI), Jardiance (yeast) Patient couldn't tolerate Rybelsus '3mg'$ , reports 21lb weight loss in 1 months due to not being able to eat Discussed re-trial of SGLT2s/DPP4 combos; patient wishes to start back on glimepiride with metformin and do diet/lifestyle Current glucose readings: Fasting glucose: 123-159, post prandial glucose: low 200s Patient now on insulin and interested in [place] 2 CGM Submitted to [facility] via parachute Patient given phone number to call [phone] to check on order status Reports the addition of red pickeled beets to diet--states it is helping her sugar; would watch sodium content Denies hypoglycemic/hyperglycemic symptoms Current exercise: n/a Discussed cholesterol and blood pressure  Hyperlipidemia -can only tolerate zetia (continue) -ICD codes documented -other therapies are cost prohibitive at this time; [employer] grant closed -needs repeat labs Lipid Panel     Component Value  Date/Time   CHOL 228 (H) 06/26/2020 0932   CHOL 136 12/21/2012 1707   TRIG 249 (H) 06/26/2020 0932   TRIG 192 (H) 04/21/2013 1512   TRIG 164 (H) 12/21/2012 1707   HDL 29 (L) 06/26/2020 0932   HDL 32 (L) 04/21/2013 1512   HDL 27 (L) 12/21/2012 1707   CHOLHDL 7.9 (H) 06/26/2020 0932   LDLCALC 153 (H) 06/26/2020 0932   LDLCALC 104 (H) 04/21/2013 1512   LDLCALC 76 12/21/2012 1707   LABVLDL 46 (H) 06/26/2020 0932     Patient Goals/Self-Care Activities patient will:  - take medications as prescribed as evidenced by patient report and record review check glucose DAILY OR IF SYMPTOMATIC, document, and provide at future appointments collaborate with provider on medication access solutions target a minimum of 150 minutes of moderate intensity exercise weekly engage in dietary modifications by FOLLOWING A HEART HEALTHY DIET/HEALTHY PLATE METHOD    Plan: Next PCP appointment scheduled for:  01/12/22  Signature Regina Eck, PharmD, BCPS Clinical Pharmacist, [place]  [facility] Phone [phone]   Please call the care guide team at [phone] if you need to cancel or reschedule your appointment.   Patient verbalizes understanding of instructions and care plan provided today and agrees to view in [place]. Active MyChart status and patient understanding of how to access instructions and care plan via MyChart confirmed with patient.

## 2021-12-31 NOTE — Progress Notes (Signed)
Chronic Care Management Pharmacy Note  12/30/2021 Name:  [name] MRN:  [id number] DOB:  1960/12/05  Summary: Diabetes: Goal on Track (progressing): YES. Uncontrolled--A1C 9.2-->improved to 7.3%; GFR 92; current treatment: Tresiba 40-60 units daily (new start in the past month) ; Metformin, glimepiride per patient preference Intolerances:  Ozempic, Rybelsus (GI), Jardiance (yeast) Patient couldn't tolerate Rybelsus 41m, reports 21lb weight loss in 1 months due to not being able to eat Discussed re-trial of SGLT2s/DPP4 combos; patient wishes to start back on glimepiride with metformin and do diet/lifestyle Current glucose readings: Fasting glucose: 123-159, post prandial glucose: low 200s Patient now on insulin and interested in lOsceola2 CGM Submitted to [facility] via parachute Patient given phone number to call [phone] check on order status Reports the addition of red pickeled beets to diet--states it is helping her sugar; would watch sodium content Denies hypoglycemic/hyperglycemic symptoms Current exercise: n/a Discussed cholesterol and blood pressure  Hyperlipidemia -can only tolerate zetia (continue) -ICD codes documented -other therapies are cost prohibitive at this time; [employer] grant closed -needs repeat labs Lipid Panel     Component Value Date/Time   CHOL 228 (H) 06/26/2020 0932   CHOL 136 12/21/2012 1707   TRIG 249 (H) 06/26/2020 0932   TRIG 192 (H) 04/21/2013 1512   TRIG 164 (H) 12/21/2012 1707   HDL 29 (L) 06/26/2020 0932   HDL 32 (L) 04/21/2013 1512   HDL 27 (L) 12/21/2012 1707   CHOLHDL 7.9 (H) 06/26/2020 0932   LDLCALC 153 (H) 06/26/2020 0932   LDLCALC 104 (H) 04/21/2013 1512   LDLCALC 76 12/21/2012 1707   LABVLDL 46 (H) 06/26/2020 0932     Patient Goals/Self-Care Activities patient will:  - take medications as prescribed as evidenced by patient report and record review check glucose DAILY OR IF SYMPTOMATIC, document, and provide at future  appointments collaborate with provider on medication access solutions target a minimum of 150 minutes of moderate intensity exercise weekly engage in dietary modifications by FOLLOWING A HEART HEALTHY DIET/HEALTHY PLATE METHOD  Subjective: Pam KUEHNLEis an 61y.o. year old female who is a primary patient of GJanora Norlander DO.  The CCM team was consulted for assistance with disease management and care coordination needs.    Engaged with patient by telephone for follow up visit in response to provider referral for pharmacy case management and/or care coordination services.   Consent to Services:  The patient was given information about Chronic Care Management services, agreed to services, and gave verbal consent prior to initiation of services.  Please see initial visit note for detailed documentation.   Patient Care Team: GJanora Norlander DO as PCP - General (Family Medicine) DNewt Minion MD as Consulting Physician (Orthopedic Surgery) JCelestia Khat [place](Optometry) PLavera Guise [facility] Pharmac[facility] (Family Medicine)  Objective:  Lab Results  Component Value Date   CREATININE 0.75 10/06/2021   CREATININE 0.78 01/24/2021   CREATININE 0.66 07/19/2020    Lab Results  Component Value Date   HGBA1C 7.3 (H) 10/06/2021   Last diabetic Eye exam:  Lab Results  Component Value Date/Time   HMDIABEYEEXA No Retinopathy 05/24/2018 12:00 AM    Last diabetic Foot exam: No results found for: "HMDIABFOOTEX"      Component Value Date/Time   CHOL 228 (H) 06/26/2020 0932   CHOL 136 12/21/2012 1707   TRIG 249 (H) 06/26/2020 0932   TRIG 192 (H) 04/21/2013 1512   TRIG 164 (H) 12/21/2012 1707  HDL 29 (L) 06/26/2020 0932   HDL 32 (L) 04/21/2013 1512   HDL 27 (L) 12/21/2012 1707   CHOLHDL 7.9 (H) 06/26/2020 0932   LDLCALC 153 (H) 06/26/2020 0932   LDLCALC 104 (H) 04/21/2013 1512   LDLCALC 76 12/21/2012 1707       Latest Ref Rng & Units 10/06/2021    2:20 PM 07/19/2020     1:24 PM 06/26/2020    9:32 AM  Hepatic Function  Total Protein 6.0 - 8.5 g/dL 7.4  7.4  6.9   Albumin 3.8 - 4.9 g/dL 3.8  3.6  3.7   AST 0 - 40 IU/L 31  33  27   ALT 0 - 32 IU/L 26  37  35   Alk Phosphatase 44 - 121 IU/L 60  44  59   Total Bilirubin 0.0 - 1.2 mg/dL <0.2  0.5  0.3     Lab Results  Component Value Date/Time   TSH 3.110 07/08/2021 02:57 PM   TSH 2.600 04/07/2021 02:25 PM   FREET4 1.26 07/08/2021 02:57 PM   FREET4 1.12 04/07/2021 02:25 PM       Latest Ref Rng & Units 10/06/2021    2:20 PM 01/24/2021   10:30 AM 07/19/2020    1:24 PM  CBC  WBC 3.4 - 10.8 x10E3/uL 14.1  15.0  10.5   Hemoglobin 11.1 - 15.9 g/dL 13.0  15.1  15.5   Hematocrit 34.0 - 46.6 % 41.3  46.2  47.7   Platelets 150 - 450 x10E3/uL 372  263  267     Lab Results  Component Value Date/Time   VD25OH 67.6 03/29/2019 03:02 PM   VD25OH 72.2 12/27/2018 12:23 PM    Clinical ASCVD: No  The 10-year ASCVD risk score (Arnett DK, et al., 2019) is: 28.8%   Values used to calculate the score:     Age: 61 years     Sex: Female     Is Non-Hispanic African American: No     Diabetic: Yes     Tobacco smoker: Yes     Systolic Blood Pressure: 997 mmHg     Is BP treated: Yes     HDL Cholesterol: 29 mg/dL     Total Cholesterol: 228 mg/dL    Other: (CHADS2VASc if Afib, PHQ9 if depression, MMRC or CAT for COPD, ACT, DEXA)  Social History   Tobacco Use  Smoking Status Every Day   Packs/day: 1.00   Years: 38.00   Total pack years: 38.00   Types: Cigarettes  Smokeless Tobacco Never   BP Readings from Last 3 Encounters:  10/06/21 134/73  07/09/21 (!) 151/67  07/08/21 (!) 145/60   Pulse Readings from Last 3 Encounters:  10/06/21 76  07/09/21 (!) 104  07/08/21 (!) 103   Wt Readings from Last 3 Encounters:  10/06/21 229 lb (103.9 kg)  07/09/21 248 lb 8 oz (112.7 kg)  07/08/21 242 lb (109.8 kg)    Assessment: Review of patient past medical history, allergies, medications, health status, including  review of consultants reports, laboratory and other test data, was performed as part of comprehensive evaluation and provision of chronic care management services.   SDOH:  (Social Determinants of Health) assessments and interventions performed:    CCM Care Plan  Allergies  Allergen Reactions   Aleve [Naproxen Sodium] Hives and Itching   Cortisone Swelling    Internal organs   Ozempic (0.25 Or 0.5 Mg-Dose) [Semaglutide(0.25 Or 0.6m-Dos)] Diarrhea and Nausea Only   Prednisone  Swelling    Makes internal organs swell   Statins Other (See Comments)    Myalgia even with Pravastatin   Vancomycin Itching   Janumet [Sitagliptin-Metformin Hcl]     Nausea/diarrhea    Semaglutide Diarrhea and Nausea And Vomiting    RYBELSUS --PATIENT CAN'T TOLERATE 3MG DAILY    Medications Reviewed Today     Reviewed by Lavera Guise, [facility] (Pharmacist) on 12/31/21 at 1156  Med List Status: <None>   Medication Order Taking? Sig Documenting Provider Last Dose Status Informant  Accu-Chek Softclix Lancets lancets [id number]  Use as instructed to test BGs up to 3 times daily E11.52 Ronnie Doss M, DO  Active   ascorbic acid (VITAMIN C) 500 MG tablet [id number] No Take 500 mg by mouth daily. [provider] Taking Active Self  aspirin EC 81 MG EC tablet [id number] No Take 1 tablet (81 mg total) by mouth daily. Bary Leriche, PA-C Taking Active Self  Cholecalciferol (VITAMIN D3) 5000 units TABS [id number] No Take 5,000 Units by mouth daily. [provider] Taking Active Self  clopidogrel (PLAVIX) 75 MG tablet [id number]  TAKE 1 TABLET DAILY Ronnie Doss M, DO  Active   diclofenac Sodium (VOLTAREN) 1 % GEL [id number] No APPLY 2 GRAMS UP TO 4 TIMES A DAY AS NEEDED Pam Norlander, DO Taking Active     Discontinued 12/31/21 1155 (Completed Course)   escitalopram (LEXAPRO) 10 MG tablet [id number]  TAKE 1 TABLET DAILY Ronnie Doss M, DO  Active   ezetimibe (ZETIA) 10 MG tablet  [id number]  TAKE 1 TABLET DAILY Ronnie Doss M, DO  Active   furosemide (LASIX) 20 MG tablet [id number]  TAKE 1 TABLET DAILY Ronnie Doss M, DO  Active   gabapentin (NEURONTIN) 300 MG capsule [id number] No TAKE 1 CAPSULE AT BEDTIME Gottschalk, Ashly M, DO Taking Active   glimepiride (AMARYL) 4 MG tablet [id number] No Take 1 tablet (4 mg total) by mouth daily before breakfast. Ronnie Doss M, DO Taking Active   glucose blood (ACCU-CHEK GUIDE) test strip [id number]  Check BS up to 3 times daily Dx E11.52 Ronnie Doss M, DO  Active   insulin degludec (TRESIBA FLEXTOUCH) 100 UNIT/ML FlexTouch Pen [id number]  Inject 40-60 Units into the skin at bedtime. Ronnie Doss M, DO  Active   Insulin Pen Needle (NOVOFINE PLUS PEN NEEDLE) 32G X 4 MM MISC [id number]  UAD with tresiba E11.9 Ronnie Doss M, DO  Active   levothyroxine (SYNTHROID) 100 MCG tablet [id number]  TAKE ONE TABLET ONCE DAILY BEFORE BREAKFAST Ronnie Doss M, DO  Active   losartan (COZAAR) 50 MG tablet [id number] No TAKE ONE TABLET BY MOUTH EVERY DAY Ronnie Doss M, DO Taking Active   metFORMIN (GLUCOPHAGE-XR) 500 MG 24 hr tablet [id number]  TAKE TWO TABLETS IN THE MORNING AND AT BEDTIME Ronnie Doss M, DO  Active   nitroGLYCERIN (NITRODUR - DOSED IN MG/24 HR) 0.2 mg/hr patch [id number] No Place 1 patch (0.2 mg total) onto the skin daily. Suzan Slick, NP Taking Active   pantoprazole (PROTONIX) 40 MG tablet [id number]  TAKE 1 TABLET DAILY Pam Norlander, DO  Active   Potassium 99 MG TABS [id number] No Take 99 mg by mouth in the morning. [provider] Taking Active Self  rivaroxaban (XARELTO) 20 MG TABS tablet [id number] No Take 1 tablet (20 mg total) by mouth daily with supper.  Patient taking differently: Take 20 mg by mouth in the morning.   Pam Norlander, DO Taking  Active   sennosides-docusate sodium (SENOKOT-S) 8.6-50 MG tablet [id number] No Take 1 tablet by mouth in the morning. [provider] Taking Active Self  Tiotropium Bromide Monohydrate (SPIRIVA RESPIMAT) 2.5 MCG/ACT AERS [id number] No Inhale 2 puffs into the lungs daily.  Patient taking differently: Inhale 2 puffs into the lungs 3 (three) times a week.   Ronnie Doss M, DO Taking Active   tretinoin (RETIN-A) 0.05 % cream [id number] No APPLY TO AFFECTED AREAS AT BEDTIME Ronnie Doss M, DO Taking Active   triamcinolone cream (KENALOG) 0.1 % [id number]  APPLY TO THE AFFECTED AREA(S) TWICE A DAY FOR 10 DAYS Stacks, Cletus Gash, MD  Active             Patient Active Problem List   Diagnosis Date Noted   Dehiscence of amputation stump ([place])    History of DVT (deep vein thrombosis) 07/03/2019   Statin intolerance 04/19/2019   Vitamin D deficiency 12/27/2018   DVT of axillary vein, chronic, bilateral ([place]) 12/27/2018   Hypertension associated with chronic kidney disease due to type 2 diabetes mellitus ([place]) 12/27/2018   Long term current use of anticoagulant 12/27/2018   Medically noncompliant 12/27/2018   Leukocytosis    Diabetes mellitus type 2 in obese [facility])    Acquired absence of right leg below knee ([place]) 07/08/2018   Intermittent claudication ([place]) 05/11/2017   Peripheral vascular insufficiency ([place]) 09/25/2015   Hypothyroidism 01/29/2014   Impetigo 07/28/2013   Chronic obstructive pulmonary disease ([place])    Malignant neoplasm of uterus ([place])    GERD (gastroesophageal reflux disease) 10/18/2012   Hernia, hiatal 10/18/2012   Hyperlipidemia associated with type 2 diabetes mellitus ([place]) 04/18/2008   SMOKER 04/18/2008    Immunization History  Administered Date(s) Administered   PFIZER(Purple Top)SARS-COV-2 Vaccination 01/25/2020, 02/15/2020   Td 06/08/2005    Conditions to be addressed/monitored: HLD and DMII  Care Plan : PHARMD MEDICATION MANAGEMENT  Updates made by Lavera Guise, RPH since 12/31/2021 12:00 AM     Problem: DISEASE PROGRESSION PREVENTION      Long-Range Goal: T2DM  PHARMD GOAL   Recent Progress: Not on track  Priority: High  Note:   Current Barriers:  Unable to independently afford treatment regimen Unable to achieve control of T2DM  Suboptimal therapeutic regimen for T2DM  Pharmacist Clinical Goal(s):  patient will verbalize ability to afford treatment regimen achieve control of T2DM as evidenced by GOAL A1C  through collaboration with PharmD and provider.   Interventions: 1:1 collaboration with Pam Norlander, DO regarding development and update of comprehensive plan of care as evidenced by provider attestation and co-signature Inter-disciplinary care team collaboration (see longitudinal plan of care) Comprehensive medication review performed; medication list updated in electronic medical record  Diabetes: Goal on Track (progressing): YES. Uncontrolled--A1C 9.2-->improved to 7.3%; GFR 92; current treatment: Tresiba 40-60 units daily (new start in the past month) ; Metformin, glimepiride per patient preference Intolerances:  Ozempic, Rybelsus (GI), Jardiance (yeast) Patient couldn't tolerate Rybelsus 42m, reports 21lb weight loss in 1 months due to not being able to eat Discussed re-trial of SGLT2s/DPP4 combos; patient wishes to start back on glimepiride with metformin and do diet/lifestyle Current glucose readings: Fasting glucose: 123-159, post prandial glucose: low 200s Patient now on insulin and interested in lPierron2 CGM Submitted to ADS via parachute Patient given phone number to call [phone] check on order status Reports the addition of red pickeled beets to diet--states it is helping her sugar; would watch sodium content Denies  hypoglycemic/hyperglycemic symptoms Current exercise: n/a Discussed cholesterol and blood pressure  Hyperlipidemia -can only tolerate zetia (continue) -ICD codes documented -other therapies are cost prohibitive at this time; healthwell grant closed -needs repeat labs Lipid Panel     Component  Value Date/Time   CHOL 228 (H) 06/26/2020 0932   CHOL 136 12/21/2012 1707   TRIG 249 (H) 06/26/2020 0932   TRIG 192 (H) 04/21/2013 1512   TRIG 164 (H) 12/21/2012 1707   HDL 29 (L) 06/26/2020 0932   HDL 32 (L) 04/21/2013 1512   HDL 27 (L) 12/21/2012 1707   CHOLHDL 7.9 (H) 06/26/2020 0932   LDLCALC 153 (H) 06/26/2020 0932   LDLCALC 104 (H) 04/21/2013 1512   LDLCALC 76 12/21/2012 1707   LABVLDL 46 (H) 06/26/2020 0932     Patient Goals/Self-Care Activities patient will:  - take medications as prescribed as evidenced by patient report and record review check glucose DAILY OR IF SYMPTOMATIC, document, and provide at future appointments collaborate with provider on medication access solutions target a minimum of 150 minutes of moderate intensity exercise weekly engage in dietary modifications by   FOLLOWING A HEART HEALTHY DIET/HEALTHY PLATE METHOD       Medication Assistance: None required.  Patient affirms current coverage meets needs.  Follow Up:  Patient agrees to Care Plan and Follow-up.  Plan: Telephone follow up appointment with care management team member scheduled for:  01/12/22    Regina Eck, PharmD, BCPS Clinical Pharmacist, [place]  II Phone [phone]

## 2022-01-05 DIAGNOSIS — E785 Hyperlipidemia, unspecified: Secondary | ICD-10-CM

## 2022-01-05 DIAGNOSIS — E1169 Type 2 diabetes mellitus with other specified complication: Secondary | ICD-10-CM | POA: Diagnosis not present

## 2022-01-12 ENCOUNTER — Other Ambulatory Visit: Payer: Self-pay

## 2022-01-12 ENCOUNTER — Other Ambulatory Visit: Payer: Medicare HMO

## 2022-01-12 ENCOUNTER — Ambulatory Visit: Payer: Medicare HMO | Admitting: Family Medicine

## 2022-01-12 DIAGNOSIS — E669 Obesity, unspecified: Secondary | ICD-10-CM

## 2022-01-12 DIAGNOSIS — E039 Hypothyroidism, unspecified: Secondary | ICD-10-CM

## 2022-01-12 DIAGNOSIS — E1169 Type 2 diabetes mellitus with other specified complication: Secondary | ICD-10-CM | POA: Diagnosis not present

## 2022-01-12 LAB — BAYER DCA HB A1C WAIVED: HB A1C (BAYER DCA - WAIVED): 8 % — ABNORMAL HIGH (ref 4.8–5.6)

## 2022-01-13 ENCOUNTER — Encounter: Payer: Self-pay | Admitting: Family Medicine

## 2022-01-13 ENCOUNTER — Ambulatory Visit (INDEPENDENT_AMBULATORY_CARE_PROVIDER_SITE_OTHER): Payer: Medicare HMO | Admitting: Family Medicine

## 2022-01-13 DIAGNOSIS — G720 Drug-induced myopathy: Secondary | ICD-10-CM

## 2022-01-13 DIAGNOSIS — K219 Gastro-esophageal reflux disease without esophagitis: Secondary | ICD-10-CM | POA: Diagnosis not present

## 2022-01-13 DIAGNOSIS — E785 Hyperlipidemia, unspecified: Secondary | ICD-10-CM

## 2022-01-13 DIAGNOSIS — E1122 Type 2 diabetes mellitus with diabetic chronic kidney disease: Secondary | ICD-10-CM

## 2022-01-13 DIAGNOSIS — E1169 Type 2 diabetes mellitus with other specified complication: Secondary | ICD-10-CM

## 2022-01-13 DIAGNOSIS — E039 Hypothyroidism, unspecified: Secondary | ICD-10-CM | POA: Diagnosis not present

## 2022-01-13 DIAGNOSIS — F339 Major depressive disorder, recurrent, unspecified: Secondary | ICD-10-CM

## 2022-01-13 DIAGNOSIS — I129 Hypertensive chronic kidney disease with stage 1 through stage 4 chronic kidney disease, or unspecified chronic kidney disease: Secondary | ICD-10-CM

## 2022-01-13 DIAGNOSIS — E1152 Type 2 diabetes mellitus with diabetic peripheral angiopathy with gangrene: Secondary | ICD-10-CM

## 2022-01-13 LAB — T4, FREE: Free T4: 0.92 ng/dL (ref 0.82–1.77)

## 2022-01-13 LAB — TSH: TSH: 2.65 u[IU]/mL (ref 0.450–4.500)

## 2022-01-13 MED ORDER — EZETIMIBE 10 MG PO TABS: 10.0000 mg | ORAL_TABLET | Freq: Every day | ORAL | 3 refills | Status: DC

## 2022-01-13 MED ORDER — METFORMIN HCL ER 500 MG PO TB24: ORAL_TABLET | ORAL | 3 refills | Status: DC

## 2022-01-13 MED ORDER — CLOPIDOGREL BISULFATE 75 MG PO TABS: 75.0000 mg | ORAL_TABLET | Freq: Every day | ORAL | 3 refills | Status: DC

## 2022-01-13 MED ORDER — LOSARTAN POTASSIUM 50 MG PO TABS: 50.0000 mg | ORAL_TABLET | Freq: Every day | ORAL | 3 refills | Status: DC

## 2022-01-13 MED ORDER — PANTOPRAZOLE SODIUM 40 MG PO TBEC: 40.0000 mg | DELAYED_RELEASE_TABLET | Freq: Every day | ORAL | 3 refills | Status: DC

## 2022-01-13 MED ORDER — ESCITALOPRAM OXALATE 10 MG PO TABS: 10.0000 mg | ORAL_TABLET | Freq: Every day | ORAL | 3 refills | Status: DC

## 2022-01-13 NOTE — Progress Notes (Signed)
Telephone visit  Subjective: CC:DM2 PCP: Janora Norlander, DO YHC:WCBJSE [name] is a 61 y.o. female calls for telephone consult today. Patient provides verbal consent for consult held via phone.  Due to COVID-19 pandemic this visit was conducted virtually. This visit type was conducted due to national recommendations for restrictions regarding the COVID-19 Pandemic (e.g. social distancing, sheltering in place) in an effort to limit this patient's exposure and mitigate transmission in our community. All issues noted in this document were discussed and addressed.  A physical exam was not performed with this format.   Location of patient: home Location of provider: [facility] Others present for call: none  1. DM w/ HTN, HLD She had BGs into the 200s for several weeks.  She is taking red pickle beets and over the last 3 weeks she has had BGs <150.  Currently injecting 41 units. Lowest 123.  She has not been able to get the CGM because she was told by her insurance that it was not covered.  She is very interested in this however and would like to pursue it if she can get 1.  She is really been trying to cut back on her Asian food intake and avoiding egg rolls and rice etc.  2.  Hypothyroidism Compliant with thyroid medications.  No reports of tremor or heart palpitations  ROS: Per HPI  Allergies  Allergen Reactions   Aleve [Naproxen Sodium] Hives and Itching   Cortisone Swelling    Internal organs   Ozempic (0.25 Or 0.5 Mg-Dose) [Semaglutide(0.25 Or 0.'5mg'$ -Dos)] Diarrhea and Nausea Only   Prednisone Swelling    Makes internal organs swell   Statins Other (See Comments)    Myalgia even with Pravastatin   Vancomycin Itching   Janumet [Sitagliptin-Metformin Hcl]     Nausea/diarrhea    Semaglutide Diarrhea and Nausea And Vomiting    RYBELSUS --PATIENT CAN'Pam TOLERATE '3MG'$  DAILY   Past Medical History:  Diagnosis Date   Abscess 01/07/2021   Acute blood loss anemia    Arthritis    COPD  (chronic obstructive pulmonary disease) (HCC)    Deep vein thrombosis (DVT) of both lower extremities (HCC) 01/05/2017   Depression    DM (diabetes mellitus) (HCC)    DVT (deep venous thrombosis) (HCC)    x5   Family history of colon cancer    GERD (gastroesophageal reflux disease)    Hyperlipemia    Hypertension    Hypothyroid    Mild protein-calorie malnutrition (HCC)    Obesity    Osteomyelitis of great toe of right foot ([place]) 07/04/2018   PVD (peripheral vascular disease) (HCC)    S/P unilateral BKA (below knee amputation), right (HCC)    Type 2 diabetes mellitus with diabetic peripheral angiopathy and gangrene, without long-term current use of insulin ([place]) 12/27/2018   Unilateral complete BKA, right, initial encounter ([place]) 07/11/2018   Unilateral complete BKA, right, subsequent encounter ([place])    Uterine cancer ([place])    UTI (urinary tract infection)    Wound dehiscence 01/24/2021    Current Outpatient Medications:    Accu-Chek Softclix Lancets lancets, Use as instructed to test BGs up to 3 times daily E11.52, Disp: 300 each, Rfl: 3   ascorbic acid (VITAMIN C) 500 MG tablet, Take 500 mg by mouth daily., Disp: , Rfl:    aspirin EC 81 MG EC tablet, Take 1 tablet (81 mg total) by mouth daily., Disp: , Rfl:    Cholecalciferol (VITAMIN D3) 5000 units TABS, Take 5,000 Units  by mouth daily., Disp: , Rfl:    clopidogrel (PLAVIX) 75 MG tablet, TAKE 1 TABLET DAILY, Disp: 90 tablet, Rfl: 0   diclofenac Sodium (VOLTAREN) 1 % GEL, APPLY 2 GRAMS UP TO 4 TIMES A DAY AS NEEDED, Disp: 300 g, Rfl: 1   escitalopram (LEXAPRO) 10 MG tablet, TAKE 1 TABLET DAILY, Disp: 90 tablet, Rfl: 0   ezetimibe (ZETIA) 10 MG tablet, TAKE 1 TABLET DAILY, Disp: 90 tablet, Rfl: 0   furosemide (LASIX) 20 MG tablet, TAKE 1 TABLET DAILY, Disp: 90 tablet, Rfl: 0   gabapentin (NEURONTIN) 300 MG capsule, TAKE 1 CAPSULE AT BEDTIME, Disp: 30 capsule, Rfl: 2   glimepiride (AMARYL) 4 MG tablet, Take 1 tablet (4 mg total) by  mouth daily before breakfast., Disp: 30 tablet, Rfl: 3   glucose blood (ACCU-CHEK GUIDE) test strip, Check BS up to 3 times daily Dx E11.52, Disp: 300 strip, Rfl: 3   insulin degludec (TRESIBA FLEXTOUCH) 100 UNIT/ML FlexTouch Pen, Inject 40-60 Units into the skin at bedtime., Disp: 54 mL, Rfl: 3   Insulin Pen Needle (NOVOFINE PLUS PEN NEEDLE) 32G X 4 MM MISC, UAD with tresiba E11.9, Disp: 100 each, Rfl: 3   levothyroxine (SYNTHROID) 100 MCG tablet, TAKE ONE TABLET ONCE DAILY BEFORE BREAKFAST, Disp: 90 tablet, Rfl: 1   losartan (COZAAR) 50 MG tablet, TAKE ONE TABLET BY MOUTH EVERY DAY, Disp: 90 tablet, Rfl: 0   metFORMIN (GLUCOPHAGE-XR) 500 MG 24 hr tablet, TAKE TWO TABLETS IN THE MORNING AND AT BEDTIME, Disp: 360 tablet, Rfl: 0   nitroGLYCERIN (NITRODUR - DOSED IN MG/24 HR) 0.2 mg/hr patch, Place 1 patch (0.2 mg total) onto the skin daily., Disp: 30 patch, Rfl: 12   pantoprazole (PROTONIX) 40 MG tablet, TAKE 1 TABLET DAILY, Disp: 90 tablet, Rfl: 0   Potassium 99 MG TABS, Take 99 mg by mouth in the morning., Disp: , Rfl:    rivaroxaban (XARELTO) 20 MG TABS tablet, Take 1 tablet (20 mg total) by mouth daily with supper. (Patient taking differently: Take 20 mg by mouth in the morning.), Disp: 90 tablet, Rfl: 3   sennosides-docusate sodium (SENOKOT-S) 8.6-50 MG tablet, Take 1 tablet by mouth in the morning., Disp: , Rfl:    Tiotropium Bromide Monohydrate (SPIRIVA RESPIMAT) 2.5 MCG/ACT AERS, Inhale 2 puffs into the lungs daily. (Patient taking differently: Inhale 2 puffs into the lungs 3 (three) times a week.), Disp: 8 g, Rfl: 0   tretinoin (RETIN-A) 0.05 % cream, APPLY TO AFFECTED AREAS AT BEDTIME, Disp: 45 g, Rfl: 3   triamcinolone cream (KENALOG) 0.1 %, APPLY TO THE AFFECTED AREA(S) TWICE A DAY FOR 10 DAYS, Disp: 30 g, Rfl: 0  Results for orders placed or performed in visit on 01/12/22 (from the past 71 hour(s))  Bayer DCA Hb A1c Waived     Status: Abnormal   Collection Time: 01/12/22  1:57 PM   Result Value Ref Range   HB A1C (BAYER DCA - WAIVED) 8.0 (H) 4.8 - 5.6 %    Comment:          Prediabetes: 5.7 - 6.4          Diabetes: >6.4          Glycemic control for adults with diabetes: <7.0      Assessment/ Plan: 61 y.o. female   Type 2 diabetes mellitus with diabetic peripheral angiopathy and gangrene, without long-term current use of insulin (HCC) - Plan: ezetimibe (ZETIA) 10 MG tablet, metFORMIN (GLUCOPHAGE-XR) 500 MG 24 hr  tablet  Hypertension associated with chronic kidney disease due to type 2 diabetes mellitus ([place]) - Plan: losartan (COZAAR) 50 MG tablet  Hyperlipidemia associated with type 2 diabetes mellitus ([place])  Drug-induced myopathy  Gastroesophageal reflux disease without esophagitis - Plan: pantoprazole (PROTONIX) 40 MG tablet  Depression, recurrent ([place]) - Plan: escitalopram (LEXAPRO) 10 MG tablet  Acquired hypothyroidism  Sugar is not controlled with rise in A1c but it sounds like her most recent blood sugars are under much better control.  For this reason we will continue her insulin at 41 units daily as prescribed.  Her meds have been renewed.  I have reached out to clinical pharmacy with regards to CGM.  They are going to contact her on ways to navigate insurance restrictions.  Continue ARB.  Continue Zetia.  She is statin intolerant  Thyroid levels were normal  Did not discuss GERD or depression but she was due for refills of these were sent  Start time: 2:40pm End time: 2:58p  Total time spent on patient care (including telephone call/ virtual visit): 18 minutes  [place], [place] [phone]

## 2022-01-20 ENCOUNTER — Ambulatory Visit (INDEPENDENT_AMBULATORY_CARE_PROVIDER_SITE_OTHER): Payer: Medicare HMO | Admitting: Pharmacist

## 2022-01-20 DIAGNOSIS — E669 Obesity, unspecified: Secondary | ICD-10-CM

## 2022-01-20 DIAGNOSIS — E785 Hyperlipidemia, unspecified: Secondary | ICD-10-CM

## 2022-01-26 DIAGNOSIS — E1165 Type 2 diabetes mellitus with hyperglycemia: Secondary | ICD-10-CM | POA: Diagnosis not present

## 2022-01-30 ENCOUNTER — Other Ambulatory Visit: Payer: Self-pay | Admitting: Family Medicine

## 2022-02-04 NOTE — Progress Notes (Signed)
Chronic Care Management Pharmacy Note  01/20/2022 Name:  [name] MRN:  [id number] DOB:  02-Feb-1961  Summary:  Diabetes: Goal on Track (progressing): YES. Uncontrolled--A1c 7.3%-->now back up to 8.0%; GFR 92; Current treatment: Tresiba 40-60 units daily; Metformin, glimepiride per patient preference Intolerances:  Ozempic, Rybelsus (GI), Jardiance (yeast) Patient couldn't tolerate Rybelsus 75m, reports 21lb weight loss in 1 months due to not being able to eat Discussed re-trial of SGLT2s/DPP4 combos; patient wishes to start back on glimepiride with metformin and do diet/lifestyle Hoping addition of CGM will allow patient to improve glycemic control Current glucose readings: Fasting glucose: 123-159, post prandial glucose: low 200s Patient now on insulin and interested in [place] CGM Submitted to [facility] via parachute (only DME contracted with patient's insurance  Reports the addition of red pickeled beets to diet--states it is helping her sugar; would watch sodium content Denies hypoglycemic/hyperglycemic symptoms Current exercise: n/a Discussed cholesterol and blood pressure  Hyperlipidemia -can only tolerate zetia (continue) -ICD codes documented -other therapies are cost prohibitive at this time; [employer] grant closed -needs repeat labs Lipid Panel     Component Value Date/Time   CHOL 228 (H) 06/26/2020 0932   CHOL 136 12/21/2012 1707   TRIG 249 (H) 06/26/2020 0932   TRIG 192 (H) 04/21/2013 1512   TRIG 164 (H) 12/21/2012 1707   HDL 29 (L) 06/26/2020 0932   HDL 32 (L) 04/21/2013 1512   HDL 27 (L) 12/21/2012 1707   CHOLHDL 7.9 (H) 06/26/2020 0932   LDLCALC 153 (H) 06/26/2020 0932   LDLCALC 104 (H) 04/21/2013 1512   LDLCALC 76 12/21/2012 1707   LABVLDL 46 (H) 06/26/2020 0932     Patient Goals/Self-Care Activities patient will:  - take medications as prescribed as evidenced by patient report and record review check glucose DAILY OR IF SYMPTOMATIC,  document, and provide at future appointments collaborate with provider on medication access solutions target a minimum of 150 minutes of moderate intensity exercise weekly engage in dietary modifications by FOLLOWING A HEART HEALTHY DIET/HEALTHY PLATE METHOD    Subjective: BYOANA STAIBis an 61y.o. year old female who is a primary patient of GJanora Norlander DO.  The CCM team was consulted for assistance with disease management and care coordination needs.    Engaged with patient by telephone for follow up visit in response to provider referral for pharmacy case management and/or care coordination services.   Consent to Services:  The patient was given information about Chronic Care Management services, agreed to services, and gave verbal consent prior to initiation of services.  Please see initial visit note for detailed documentation.   Patient Care Team: GJanora Norlander DO as PCP - General (Family Medicine) DNewt Minion MD as Consulting Physician (Orthopedic Surgery) JCelestia Khat ORutherford(Optometry) PLavera Guise [facility] Pharmacist (Family Medicine)  Objective:  Lab Results  Component Value Date   CREATININE 0.75 10/06/2021   CREATININE 0.78 01/24/2021   CREATININE 0.66 07/19/2020    Lab Results  Component Value Date   HGBA1C 8.0 (H) 01/12/2022   Last diabetic Eye exam:  Lab Results  Component Value Date/Time   HMDIABEYEEXA No Retinopathy 05/24/2018 12:00 AM    Last diabetic Foot exam: No results found for: "HMDIABFOOTEX"      Component Value Date/Time   CHOL 228 (H) 06/26/2020 0932   CHOL 136 12/21/2012 1707   TRIG 249 (H) 06/26/2020 0932   TRIG 192 (H) 04/21/2013 1512   TRIG  164 (H) 12/21/2012 1707   HDL 29 (L) 06/26/2020 0932   HDL 32 (L) 04/21/2013 1512   HDL 27 (L) 12/21/2012 1707   CHOLHDL 7.9 (H) 06/26/2020 0932   LDLCALC 153 (H) 06/26/2020 0932   LDLCALC 104 (H) 04/21/2013 1512   LDLCALC 76 12/21/2012 1707       Latest Ref Rng &  Units 10/06/2021    2:20 PM 07/19/2020    1:24 PM 06/26/2020    9:32 AM  Hepatic Function  Total Protein 6.0 - 8.5 g/dL 7.4  7.4  6.9   Albumin 3.8 - 4.9 g/dL 3.8  3.6  3.7   AST 0 - 40 IU/L 31  33  27   ALT 0 - 32 IU/L 26  37  35   Alk Phosphatase 44 - 121 IU/L 60  44  59   Total Bilirubin 0.0 - 1.2 mg/dL <0.2  0.5  0.3     Lab Results  Component Value Date/Time   TSH 2.650 01/12/2022 01:19 PM   TSH 3.110 07/08/2021 02:57 PM   FREET4 0.92 01/12/2022 01:19 PM   FREET4 1.26 07/08/2021 02:57 PM       Latest Ref Rng & Units 10/06/2021    2:20 PM 01/24/2021   10:30 AM 07/19/2020    1:24 PM  CBC  WBC 3.4 - 10.8 x10E3/uL 14.1  15.0  10.5   Hemoglobin 11.1 - 15.9 g/dL 13.0  15.1  15.5   Hematocrit 34.0 - 46.6 % 41.3  46.2  47.7   Platelets 150 - 450 x10E3/uL 372  263  267     Lab Results  Component Value Date/Time   VD25OH 67.6 03/29/2019 03:02 PM   VD25OH 72.2 12/27/2018 12:23 PM    Clinical ASCVD: No  The 10-year ASCVD risk score (Arnett DK, et al., 2019) is: 28.8%   Values used to calculate the score:     Age: 61 years     Sex: Female     Is Non-Hispanic African American: No     Diabetic: Yes     Tobacco smoker: Yes     Systolic Blood Pressure: 540 mmHg     Is BP treated: Yes     HDL Cholesterol: 29 mg/dL     Total Cholesterol: 228 mg/dL    Other: (CHADS2VASc if Afib, PHQ9 if depression, MMRC or CAT for COPD, ACT, DEXA)  Social History   Tobacco Use  Smoking Status Every Day   Packs/day: 1.00   Years: 38.00   Total pack years: 38.00   Types: Cigarettes  Smokeless Tobacco Never   BP Readings from Last 3 Encounters:  10/06/21 134/73  07/09/21 (!) 151/67  07/08/21 (!) 145/60   Pulse Readings from Last 3 Encounters:  10/06/21 76  07/09/21 (!) 104  07/08/21 (!) 103   Wt Readings from Last 3 Encounters:  10/06/21 229 lb (103.9 kg)  07/09/21 248 lb 8 oz (112.7 kg)  07/08/21 242 lb (109.8 kg)    Assessment: Review of patient past medical history, allergies,  medications, health status, including review of consultants reports, laboratory and other test data, was performed as part of comprehensive evaluation and provision of chronic care management services.   SDOH:  (Social Determinants of Health) assessments and interventions performed:    CCM Care Plan  Allergies  Allergen Reactions   Aleve [Naproxen Sodium] Hives and Itching   Cortisone Swelling    Internal organs   Ozempic (0.25 Or 0.5 Mg-Dose) [Semaglutide(0.25 Or 0.30m-Dos)] Diarrhea  and Nausea Only   Prednisone Swelling    Makes internal organs swell   Statins Other (See Comments)    Myalgia even with Pravastatin   Vancomycin Itching   Janumet [Sitagliptin-Metformin Hcl]     Nausea/diarrhea    Semaglutide Diarrhea and Nausea And Vomiting    RYBELSUS --PATIENT CAN'T TOLERATE 3MG DAILY    Medications Reviewed Today     Reviewed by Lavera Guise, [facility] (Pharmacist) on 02/04/22 at [place] List Status: <None>   Medication Order Taking? Sig Documenting Provider Last Dose Status Informant  Accu-Chek Softclix Lancets lancets [id number]  Use as instructed to test BGs up to 3 times daily E11.52 Ronnie Doss M, DO  Active   ascorbic acid (VITAMIN C) 500 MG tablet [id number] No Take 500 mg by mouth daily. [provider] Taking Active Self  aspirin EC 81 MG EC tablet [id number] No Take 1 tablet (81 mg total) by mouth daily. Bary Leriche, PA-C Taking Active Self  Cholecalciferol (VITAMIN D3) 5000 units TABS [id number] No Take 5,000 Units by mouth daily. [provider] Taking Active Self  clopidogrel (PLAVIX) 75 MG tablet [id number]  Take 1 tablet (75 mg total) by mouth daily. Ronnie Doss M, DO  Active   diclofenac Sodium (VOLTAREN) 1 % GEL [id number] No APPLY 2 GRAMS UP TO 4 TIMES A DAY AS NEEDED Ronnie Doss M, DO Taking Active   escitalopram (LEXAPRO) 10 MG tablet [id number]  Take 1 tablet (10 mg total) by mouth daily. Ronnie Doss M, DO  Active    ezetimibe (ZETIA) 10 MG tablet [id number]  Take 1 tablet (10 mg total) by mouth daily. Ronnie Doss M, DO  Active   furosemide (LASIX) 20 MG tablet [id number]  TAKE 1 TABLET DAILY Ronnie Doss M, DO  Active   gabapentin (NEURONTIN) 300 MG capsule [id number] No TAKE 1 CAPSULE AT BEDTIME Ronnie Doss M, DO Taking Active   glimepiride (AMARYL) 4 MG tablet [id number] No Take 1 tablet (4 mg total) by mouth daily before breakfast. Ronnie Doss M, DO Taking Active   glucose blood (ACCU-CHEK GUIDE) test strip [id number]  Check BS up to 3 times daily Dx E11.52 Ronnie Doss M, DO  Active   insulin degludec (TRESIBA FLEXTOUCH) 100 UNIT/ML FlexTouch Pen [id number]  Inject 40-60 Units into the skin at bedtime. Ronnie Doss M, DO  Active   Insulin Pen Needle (NOVOFINE PLUS PEN NEEDLE) 32G X 4 MM MISC [id number]  UAD with tresiba E11.9 Ronnie Doss M, DO  Active   levothyroxine (SYNTHROID) 100 MCG tablet [id number]  TAKE ONE TABLET ONCE DAILY BEFORE BREAKFAST Ronnie Doss M, DO  Active   losartan (COZAAR) 50 MG tablet [id number]  Take 1 tablet (50 mg total) by mouth daily. Ronnie Doss M, DO  Active   metFORMIN (GLUCOPHAGE-XR) 500 MG 24 hr tablet [id number]  TAKE TWO TABLETS IN THE MORNING AND AT BEDTIME Ronnie Doss M, DO  Active   nitroGLYCERIN (NITRODUR - DOSED IN MG/24 HR) 0.2 mg/hr patch [id number] No Place 1 patch (0.2 mg total) onto the skin daily. Suzan Slick, NP Taking Active   pantoprazole (PROTONIX) 40 MG tablet [id number]  Take 1 tablet (40 mg total) by mouth daily. Ronnie Doss M, DO  Active   Potassium 99 MG TABS [id number] No Take 99 mg by mouth in the morning. [provider] Taking Active Self  sennosides-docusate sodium (SENOKOT-S) 8.6-50 MG tablet [id number] No Take 1 tablet by mouth in the morning. [provider]  Taking Active Self  Tiotropium Bromide Monohydrate (SPIRIVA RESPIMAT) 2.5 MCG/ACT AERS [id number] No Inhale 2 puffs into  the lungs daily.  Patient taking differently: Inhale 2 puffs into the lungs 3 (three) times a week.   Ronnie Doss M, DO Taking Active   tretinoin (RETIN-A) 0.05 % cream [id number] No APPLY TO AFFECTED AREAS AT BEDTIME Ronnie Doss M, DO Taking Active   triamcinolone cream (KENALOG) 0.1 % [id number]  APPLY TO THE AFFECTED AREA(S) TWICE A DAY FOR 10 DAYS Stacks, Cletus Gash, MD  Active             Patient Active Problem List   Diagnosis Date Noted   Dehiscence of amputation stump ([place])    History of DVT (deep vein thrombosis) 07/03/2019   Statin intolerance 04/19/2019   Vitamin D deficiency 12/27/2018   DVT of axillary vein, chronic, bilateral ([place]) 12/27/2018   Hypertension associated with chronic kidney disease due to type 2 diabetes mellitus ([place]) 12/27/2018   Long term current use of anticoagulant 12/27/2018   Medically noncompliant 12/27/2018   Leukocytosis    Diabetes mellitus type 2 in obese [facility])    Acquired absence of right leg below knee ([place]) 07/08/2018   Intermittent claudication ([place]) 05/11/2017   Peripheral vascular insufficiency ([place]) 09/25/2015   Hypothyroidism 01/29/2014   Impetigo 07/28/2013   Chronic obstructive pulmonary disease ([place])    Malignant neoplasm of uterus ([place])    GERD (gastroesophageal reflux disease) 10/18/2012   Hernia, hiatal 10/18/2012   Hyperlipidemia associated with type 2 diabetes mellitus ([place]) 04/18/2008   SMOKER 04/18/2008    Immunization History  Administered Date(s) Administered   PFIZER(Purple Top)SARS-COV-2 Vaccination 01/25/2020, 02/15/2020   Td 06/08/2005    Conditions to be addressed/monitored: HLD and DMII  Care Plan : PHARMD MEDICATION MANAGEMENT  Updates made by Lavera Guise, RPH since 02/04/2022 12:00 AM     Problem: DISEASE PROGRESSION PREVENTION      Long-Range Goal: T2DM PHARMD GOAL   Recent Progress: Not on track  Priority: High  Note:   Current Barriers:  Unable to independently afford treatment  regimen Unable to achieve control of T2DM  Suboptimal therapeutic regimen for T2DM  Pharmacist Clinical Goal(s):  patient will verbalize ability to afford treatment regimen achieve control of T2DM as evidenced by GOAL A1C  through collaboration with PharmD and provider.   Interventions: 1:1 collaboration with Janora Norlander, DO regarding development and update of comprehensive plan of care as evidenced by provider attestation and co-signature Inter-disciplinary care team collaboration (see longitudinal plan of care) Comprehensive medication review performed; medication list updated in electronic medical record  Diabetes: Goal on Track (progressing): YES. Uncontrolled--A1c 7.3%-->now back up to 8.0%; GFR 92; Current treatment: Tresiba 40-60 units daily; Metformin, glimepiride per patient preference Intolerances:  Ozempic, Rybelsus (GI), Jardiance (yeast) Patient couldn't tolerate Rybelsus 24m, reports 21lb weight loss in 1 months due to not being able to eat Discussed re-trial of SGLT2s/DPP4 combos; patient wishes to start back on glimepiride with metformin and do diet/lifestyle Hoping addition of CGM will allow patient to improve glycemic control Current glucose readings: Fasting glucose: 123-159, post prandial glucose: low 200s Patient now on insulin and interested in [place] CGM Submitted to [facility] via parachute (only DME contracted with patient's insurance  Reports the addition of red pickeled beets to diet--states it is helping her sugar; would watch sodium content Denies hypoglycemic/hyperglycemic symptoms Current exercise: n/a Discussed cholesterol and blood pressure  Hyperlipidemia -can only tolerate zetia (continue) -ICD codes documented -  other therapies are cost prohibitive at this time; [employer] grant closed -needs repeat labs Lipid Panel     Component Value Date/Time   CHOL 228 (H) 06/26/2020 0932   CHOL 136 12/21/2012 1707   TRIG 249 (H) 06/26/2020 0932    TRIG 192 (H) 04/21/2013 1512   TRIG 164 (H) 12/21/2012 1707   HDL 29 (L) 06/26/2020 0932   HDL 32 (L) 04/21/2013 1512   HDL 27 (L) 12/21/2012 1707   CHOLHDL 7.9 (H) 06/26/2020 0932   LDLCALC 153 (H) 06/26/2020 0932   LDLCALC 104 (H) 04/21/2013 1512   LDLCALC 76 12/21/2012 1707   LABVLDL 46 (H) 06/26/2020 0932     Patient Goals/Self-Care Activities patient will:  - take medications as prescribed as evidenced by patient report and record review check glucose DAILY OR IF SYMPTOMATIC, document, and provide at future appointments collaborate with provider on medication access solutions target a minimum of 150 minutes of moderate intensity exercise weekly engage in dietary modifications by   FOLLOWING A HEART HEALTHY DIET/HEALTHY PLATE METHOD       Plan: Telephone follow up appointment with care management team member scheduled for:  2 months   Regina Eck, PharmD, BCPS Clinical Pharmacist, [place]  II Phone [phone]

## 2022-02-04 NOTE — Patient Instructions (Addendum)
Visit Information  Following are the goals we discussed today:  Current Barriers:  Unable to independently afford treatment regimen Unable to achieve control of T2DM  Suboptimal therapeutic regimen for T2DM  Pharmacist Clinical Goal(s):  patient will verbalize ability to afford treatment regimen achieve control of T2DM as evidenced by GOAL A1C through collaboration with PharmD and provider.   Interventions: 1:1 collaboration with Janora Norlander, DO regarding development and update of comprehensive plan of care as evidenced by provider attestation and co-signature Inter-disciplinary care team collaboration (see longitudinal plan of care) Comprehensive medication review performed; medication list updated in electronic medical record  Diabetes: Goal on Track (progressing): YES. Uncontrolled--A1c 7.3%-->now back up to 8.0%; GFR 92; Current treatment: Tresiba 40-60 units daily; Metformin, glimepiride per patient preference Intolerances:  Ozempic, Rybelsus (GI), Jardiance (yeast) Patient couldn't tolerate Rybelsus '3mg'$ , reports 21lb weight loss in 1 months due to not being able to eat Discussed re-trial of SGLT2s/DPP4 combos; patient wishes to start back on glimepiride with metformin and do diet/lifestyle Hoping addition of CGM will allow patient to improve glycemic control Current glucose readings: Fasting glucose: 123-159, post prandial glucose: low 200s Patient now on insulin and interested in [place] 2 CGM Submitted to [facility] via parachute (only DME contracted with patient's insurance  Reports the addition of red pickeled beets to diet--states it is helping her sugar; would watch sodium content Denies hypoglycemic/hyperglycemic symptoms Current exercise: n/a Discussed cholesterol and blood pressure  Hyperlipidemia -can only tolerate zetia (continue) -ICD codes documented -other therapies are cost prohibitive at this time; healthwell grant closed -needs repeat labs Lipid  Panel     Component Value Date/Time   CHOL 228 (H) 06/26/2020 0932   CHOL 136 12/21/2012 1707   TRIG 249 (H) 06/26/2020 0932   TRIG 192 (H) 04/21/2013 1512   TRIG 164 (H) 12/21/2012 1707   HDL 29 (L) 06/26/2020 0932   HDL 32 (L) 04/21/2013 1512   HDL 27 (L) 12/21/2012 1707   CHOLHDL 7.9 (H) 06/26/2020 0932   LDLCALC 153 (H) 06/26/2020 0932   LDLCALC 104 (H) 04/21/2013 1512   LDLCALC 76 12/21/2012 1707   LABVLDL 46 (H) 06/26/2020 0932     Patient Goals/Self-Care Activities patient will:  - take medications as prescribed as evidenced by patient report and record review check glucose DAILY OR IF SYMPTOMATIC, document, and provide at future appointments collaborate with provider on medication access solutions target a minimum of 150 minutes of moderate intensity exercise weekly engage in dietary modifications by FOLLOWING A HEART HEALTHY DIET/HEALTHY PLATE METHOD    Plan: Telephone follow up appointment with care management team member scheduled for:  3 months  Signature Regina Eck, PharmD, BCPS Clinical Pharmacist, [place]  II Phone [phone]   Please call the care guide team at [phone] if you need to cancel or reschedule your appointment.   The patient verbalized understanding of instructions, educational materials, and care plan provided today and DECLINED offer to receive copy of patient instructions, educational materials, and care plan.

## 2022-02-05 DIAGNOSIS — Z7985 Long-term (current) use of injectable non-insulin antidiabetic drugs: Secondary | ICD-10-CM

## 2022-02-05 DIAGNOSIS — E1169 Type 2 diabetes mellitus with other specified complication: Secondary | ICD-10-CM | POA: Diagnosis not present

## 2022-02-05 DIAGNOSIS — E785 Hyperlipidemia, unspecified: Secondary | ICD-10-CM

## 2022-02-20 DIAGNOSIS — Z029 Encounter for administrative examinations, unspecified: Secondary | ICD-10-CM

## 2022-03-04 ENCOUNTER — Ambulatory Visit: Payer: Medicare HMO | Admitting: Pharmacist

## 2022-03-04 DIAGNOSIS — E1152 Type 2 diabetes mellitus with diabetic peripheral angiopathy with gangrene: Secondary | ICD-10-CM

## 2022-03-04 DIAGNOSIS — E1169 Type 2 diabetes mellitus with other specified complication: Secondary | ICD-10-CM

## 2022-03-05 NOTE — Progress Notes (Signed)
Chronic Care Management Pharmacy Note  03/04/2022 Name:  [name] MRN:  [id number] DOB:  20-Jan-1961  Summary:  Diabetes: Goal on Track (progressing): YES. Uncontrolled--A1c 7.3%-->now back up to 8.0%; GFR 92; Current treatment: Tresiba 40-60 units daily; Metformin, glimepiride per patient preference Intolerances:  Ozempic, Rybelsus (GI), Jardiance (yeast) Patient couldn't tolerate Rybelsus 26m, reports 21lb weight loss in 1 months due to not being able to eat Discussed re-trial of SGLT2s/DPP4 combos; patient wishes to start back on glimepiride with metformin and do diet/lifestyle; adding insulin Hoping addition of CGM will allow patient to improve glycemic control Current glucose readings: Fasting glucose: 123-159, post prandial glucose: low 200s Patient now on insulin and interested in [place] CGM Patient had libre 2 CGM delivered by [facility] (via parachute--only DME contracted with patient's insurance) She reports $140 copay for 3 month supply; she did not receive reader device-->emailed [facility] & requested device by delivered  Reports the addition of red pickeled beets to diet--states it is helping her sugar; would watch sodium content Denies hypoglycemic/hyperglycemic symptoms Current exercise: n/a Discussed cholesterol and blood pressure  Hyperlipidemia -can only tolerate zetia (continue) -ICD codes documented -other therapies are cost prohibitive at this time; healthwell grant closed -needs repeat labs Lipid Panel     Component Value Date/Time   CHOL 228 (H) 06/26/2020 0932   CHOL 136 12/21/2012 1707   TRIG 249 (H) 06/26/2020 0932   TRIG 192 (H) 04/21/2013 1512   TRIG 164 (H) 12/21/2012 1707   HDL 29 (L) 06/26/2020 0932   HDL 32 (L) 04/21/2013 1512   HDL 27 (L) 12/21/2012 1707   CHOLHDL 7.9 (H) 06/26/2020 0932   LDLCALC 153 (H) 06/26/2020 0932   LDLCALC 104 (H) 04/21/2013 1512   LDLCALC 76 12/21/2012 1707   LABVLDL 46 (H) 06/26/2020 0932     Patient  Goals/Self-Care Activities patient will:  - take medications as prescribed as evidenced by patient report and record review check glucose DAILY OR IF SYMPTOMATIC, document, and provide at future appointments collaborate with provider on medication access solutions target a minimum of 150 minutes of moderate intensity exercise weekly engage in dietary modifications by FOLLOWING A HEART HEALTHY DIET/HEALTHY PLATE METHOD    Subjective: Pam MOWBRAYis an 61y.o. year old female who is a primary patient of Pam Norlander DO.  The CCM team was consulted for assistance with disease management and care coordination needs.    Engaged with patient by telephone for follow up visit in response to provider referral for pharmacy case management and/or care coordination services.   Consent to Services:  The patient was given information about Chronic Care Management services, agreed to services, and gave verbal consent prior to initiation of services.  Please see initial visit note for detailed documentation.   Patient Care Team: Pam Norlander DO as PCP - General (Family Medicine) DNewt Minion MD as Consulting Physician (Orthopedic Surgery) JCelestia Khat OFieldale(Optometry) PLavera Guise [facility] Centeras Pharmacist (Family Medicine)  Objective:  Lab Results  Component Value Date   CREATININE 0.75 10/06/2021   CREATININE 0.78 01/24/2021   CREATININE 0.66 07/19/2020    Lab Results  Component Value Date   HGBA1C 8.0 (H) 01/12/2022   Last diabetic Eye exam:  Lab Results  Component Value Date/Time   HMDIABEYEEXA No Retinopathy 05/24/2018 12:00 AM    Last diabetic Foot exam: No results found for: "HMDIABFOOTEX"      Component Value Date/Time   CHOL 228 (  H) 06/26/2020 0932   CHOL 136 12/21/2012 1707   TRIG 249 (H) 06/26/2020 0932   TRIG 192 (H) 04/21/2013 1512   TRIG 164 (H) 12/21/2012 1707   HDL 29 (L) 06/26/2020 0932   HDL 32 (L) 04/21/2013 1512   HDL 27 (L) 12/21/2012 1707    CHOLHDL 7.9 (H) 06/26/2020 0932   LDLCALC 153 (H) 06/26/2020 0932   LDLCALC 104 (H) 04/21/2013 1512   LDLCALC 76 12/21/2012 1707       Latest Ref Rng & Units 10/06/2021    2:20 PM 07/19/2020    1:24 PM 06/26/2020    9:32 AM  Hepatic Function  Total Protein 6.0 - 8.5 g/dL 7.4  7.4  6.9   Albumin 3.8 - 4.9 g/dL 3.8  3.6  3.7   AST 0 - 40 IU/L 31  33  27   ALT 0 - 32 IU/L 26  37  35   Alk Phosphatase 44 - 121 IU/L 60  44  59   Total Bilirubin 0.0 - 1.2 mg/dL <0.2  0.5  0.3     Lab Results  Component Value Date/Time   TSH 2.650 01/12/2022 01:19 PM   TSH 3.110 07/08/2021 02:57 PM   FREET4 0.92 01/12/2022 01:19 PM   FREET4 1.26 07/08/2021 02:57 PM       Latest Ref Rng & Units 10/06/2021    2:20 PM 01/24/2021   10:30 AM 07/19/2020    1:24 PM  CBC  WBC 3.4 - 10.8 x10E3/uL 14.1  15.0  10.5   Hemoglobin 11.1 - 15.9 g/dL 13.0  15.1  15.5   Hematocrit 34.0 - 46.6 % 41.3  46.2  47.7   Platelets 150 - 450 x10E3/uL 372  263  267     Lab Results  Component Value Date/Time   VD25OH 67.6 03/29/2019 03:02 PM   VD25OH 72.2 12/27/2018 12:23 PM    Clinical ASCVD: No  The 10-year ASCVD risk score (Arnett DK, et al., 2019) is: 28.8%   Values used to calculate the score:     Age: 26 years     Sex: Female     Is Non-Hispanic African American: No     Diabetic: Yes     Tobacco smoker: Yes     Systolic Blood Pressure: 675 mmHg     Is BP treated: Yes     HDL Cholesterol: 29 mg/dL     Total Cholesterol: 228 mg/dL    Other: (CHADS2VASc if Afib, PHQ9 if depression, MMRC or CAT for COPD, ACT, DEXA)  Social History   Tobacco Use  Smoking Status Every Day   Packs/day: 1.00   Years: 38.00   Total pack years: 38.00   Types: Cigarettes  Smokeless Tobacco Never   BP Readings from Last 3 Encounters:  10/06/21 134/73  07/09/21 (!) 151/67  07/08/21 (!) 145/60   Pulse Readings from Last 3 Encounters:  10/06/21 76  07/09/21 (!) 104  07/08/21 (!) 103   Wt Readings from Last 3  Encounters:  10/06/21 229 lb (103.9 kg)  07/09/21 248 lb 8 oz (112.7 kg)  07/08/21 242 lb (109.8 kg)    Assessment: Review of patient past medical history, allergies, medications, health status, including review of consultants reports, laboratory and other test data, was performed as part of comprehensive evaluation and provision of chronic care management services.   SDOH:  (Social Determinants of Health) assessments and interventions performed:  SDOH Interventions    Flowsheet Row Clinical Support from 06/13/2021 in  [place] Office Visit from 12/31/2020 in [place] from 06/12/2020 in [place] Visit from 07/03/2019 in [place] Visit from 02/17/2016 in [place] Interventions       Food Insecurity Interventions Intervention Not Indicated -- Intervention Not Indicated -- --  Housing Interventions Intervention Not Indicated -- -- -- --  Transportation Interventions Intervention Not Indicated -- Intervention Not Indicated -- --  Depression Interventions/Treatment  -- Currently on Treatment -- Currently on Treatment Medication  Financial Strain Interventions Intervention Not Indicated -- Intervention Not Indicated -- --  Physical Activity Interventions Patient Refused, Other (Comments)  [R BKA - limited to wheelchair - infected stump - says she will try to be more active when she heals] -- Patient Refused -- --  Stress Interventions Intervention Not Indicated -- Intervention Not Indicated -- --  Social Connections Interventions Intervention Not Indicated -- Intervention Not Indicated -- --       CCM Care Plan  Allergies  Allergen Reactions   Aleve [Naproxen Sodium] Hives and Itching   Cortisone Swelling    Internal organs   Ozempic (0.25 Or 0.5 Mg-Dose) [Semaglutide(0.25 Or 0.64m-Dos)] Diarrhea and Nausea Only   Prednisone Swelling     Makes internal organs swell   Statins Other (See Comments)    Myalgia even with Pravastatin   Vancomycin Itching   Janumet [Sitagliptin-Metformin Hcl]     Nausea/diarrhea    Semaglutide Diarrhea and Nausea And Vomiting    RYBELSUS --PATIENT CAN'T TOLERATE 3MG DAILY    Medications Reviewed Today     Reviewed by PLavera Guise [facility](Pharmacist) on 02/04/22 at 0[place] HeightsList Status: <None>   Medication Order Taking? Sig Documenting Provider Last Dose Status Informant  Accu-Chek Softclix Lancets lancets [id number] Use as instructed to test BGs up to 3 times daily E11.52 GRonnie DossM, DO  Active   ascorbic acid (VITAMIN C) 500 MG tablet [id number] Take 500 mg by mouth daily. [provider] Taking Active Self  aspirin EC 81 MG EC tablet [id number] Take 1 tablet (81 mg total) by mouth daily. LBary Leriche PA-C Taking Active Self  Cholecalciferol (VITAMIN D3) 5000 units TABS [id number] Take 5,000 Units by mouth daily. [provider] Taking Active Self  clopidogrel (PLAVIX) 75 MG tablet [id number] Take 1 tablet (75 mg total) by mouth daily. GRonnie DossM, DO  Active   diclofenac Sodium (VOLTAREN) 1 % GEL [id number] APPLY 2 GRAMS UP TO 4 TIMES A DAY AS NEEDED GRonnie DossM, DO Taking Active   escitalopram (LEXAPRO) 10 MG tablet [id number] Take 1 tablet (10 mg total) by mouth daily. GRonnie DossM, DO  Active   ezetimibe (ZETIA) 10 MG tablet [id number] Take 1 tablet (10 mg total) by mouth daily. GRonnie DossM, DO  Active   furosemide (LASIX) 20 MG tablet [id number] TAKE 1 TABLET DAILY GRonnie DossM, DO  Active   gabapentin (NEURONTIN) 300 MG capsule [id number] TAKE 1 CAPSULE AT BEDTIME GRonnie DossM, DO Taking Active   glimepiride (AMARYL) 4 MG tablet [id number] Take 1 tablet (4 mg total) by mouth daily before breakfast. GRonnie DossM, DO Taking Active   glucose blood (ACCU-CHEK GUIDE) test strip [id number] Check  BS up to 3 times daily Dx E11.52 GRonnie DossM, DO  Active   insulin degludec (TRESIBA FLEXTOUCH) 100 UNIT/ML  FlexTouch Pen [id number]  Inject 40-60 Units into the skin at bedtime. Ronnie Doss M, DO  Active   Insulin Pen Needle (NOVOFINE PLUS PEN NEEDLE) 32G X 4 MM MISC [id number]  UAD with tresiba E11.9 Ronnie Doss M, DO  Active   levothyroxine (SYNTHROID) 100 MCG tablet [id number]  TAKE ONE TABLET ONCE DAILY BEFORE BREAKFAST Ronnie Doss M, DO  Active   losartan (COZAAR) 50 MG tablet [id number]  Take 1 tablet (50 mg total) by mouth daily. Ronnie Doss M, DO  Active   metFORMIN (GLUCOPHAGE-XR) 500 MG 24 hr tablet [id number]  TAKE TWO TABLETS IN THE MORNING AND AT BEDTIME Ronnie Doss M, DO  Active   nitroGLYCERIN (NITRODUR - DOSED IN MG/24 HR) 0.2 mg/hr patch [id number] No Place 1 patch (0.2 mg total) onto the skin daily. Suzan Slick, NP Taking Active   pantoprazole (PROTONIX) 40 MG tablet [id number]  Take 1 tablet (40 mg total) by mouth daily. Ronnie Doss M, DO  Active   Potassium 99 MG TABS [id number] No Take 99 mg by mouth in the morning. [provider] Taking Active Self  sennosides-docusate sodium (SENOKOT-S) 8.6-50 MG tablet [id number] No Take 1 tablet by mouth in the morning. [provider] Taking Active Self  Tiotropium Bromide Monohydrate (SPIRIVA RESPIMAT) 2.5 MCG/ACT AERS [id number] No Inhale 2 puffs into the lungs daily.  Patient taking differently: Inhale 2 puffs into the lungs 3 (three) times a week.   Ronnie Doss M, DO Taking Active   tretinoin (RETIN-A) 0.05 % cream [id number] No APPLY TO AFFECTED AREAS AT BEDTIME Ronnie Doss M, DO Taking Active   triamcinolone cream (KENALOG) 0.1 % [id number]  APPLY TO THE AFFECTED AREA(S) TWICE A DAY FOR 10 DAYS Stacks, Cletus Gash, MD  Active             Patient Active Problem List   Diagnosis Date Noted   Dehiscence of amputation stump ([place])    History of DVT (deep vein thrombosis)  07/03/2019   Statin intolerance 04/19/2019   Vitamin D deficiency 12/27/2018   DVT of axillary vein, chronic, bilateral ([place]) 12/27/2018   Hypertension associated with chronic kidney disease due to type 2 diabetes mellitus ([place]) 12/27/2018   Long term current use of anticoagulant 12/27/2018   Medically noncompliant 12/27/2018   Leukocytosis    Diabetes mellitus type 2 in obese [facility])    Acquired absence of right leg below knee ([place]) 07/08/2018   Intermittent claudication ([place]) 05/11/2017   Peripheral vascular insufficiency ([place]) 09/25/2015   Hypothyroidism 01/29/2014   Impetigo 07/28/2013   Chronic obstructive pulmonary disease ([place])    Malignant neoplasm of uterus ([place])    GERD (gastroesophageal reflux disease) 10/18/2012   Hernia, hiatal 10/18/2012   Hyperlipidemia associated with type 2 diabetes mellitus ([place]) 04/18/2008   SMOKER 04/18/2008    Immunization History  Administered Date(s) Administered   PFIZER(Purple Top)SARS-COV-2 Vaccination 01/25/2020, 02/15/2020   Td 06/08/2005    Conditions to be addressed/monitored: HTN, HLD, and DMII  Care Plan : PHARMD MEDICATION MANAGEMENT  Updates made by Lavera Guise, RPH since 03/05/2022 12:00 AM     Problem: DISEASE PROGRESSION PREVENTION      Long-Range Goal: T2DM PHARMD GOAL   Recent Progress: Not on track  Priority: High  Note:   Current Barriers:  Unable to independently afford treatment regimen Unable to achieve control of T2DM  Suboptimal therapeutic regimen for T2DM  Pharmacist Clinical Goal(s):  patient will verbalize ability to afford treatment regimen achieve control of  T2DM as evidenced by GOAL A1C  through collaboration with PharmD and provider.   Interventions: 1:1 collaboration with Janora Norlander, DO regarding development and update of comprehensive plan of care as evidenced by provider attestation and co-signature Inter-disciplinary care team collaboration (see longitudinal plan of  care) Comprehensive medication review performed; medication list updated in electronic medical record  Diabetes: Goal on Track (progressing): YES. Uncontrolled--A1c 7.3%-->now back up to 8.0%; GFR 92; Current treatment: Tresiba 40-60 units daily; Metformin, glimepiride per patient preference Intolerances:  Ozempic, Rybelsus (GI), Jardiance (yeast) Patient couldn't tolerate Rybelsus 63m, reports 21lb weight loss in 1 months due to not being able to eat Discussed re-trial of SGLT2s/DPP4 combos; patient wishes to start back on glimepiride with metformin and do diet/lifestyle; adding insulin Hoping addition of CGM will allow patient to improve glycemic control Current glucose readings: Fasting glucose: 123-159, post prandial glucose: low 200s Patient now on insulin and interested in l[place]2 CGM Patient had libre 2 CGM delivered by [facility] (via parachute--only DME contracted with patient's insurance) She reports $140 copay for 3 month supply; she did not receive reader device-->emailed [facility] [employer] & requested device by delivered  Reports the addition of red pickeled beets to diet--states it is helping her sugar; would watch sodium content Denies hypoglycemic/hyperglycemic symptoms Current exercise: n/a Discussed cholesterol and blood pressure  Hyperlipidemia -can only tolerate zetia (continue) -ICD codes documented -other therapies are cost prohibitive at this time; healthwell grant closed -needs repeat labs Lipid Panel     Component Value Date/Time   CHOL 228 (H) 06/26/2020 0932   CHOL 136 12/21/2012 1707   TRIG 249 (H) 06/26/2020 0932   TRIG 192 (H) 04/21/2013 1512   TRIG 164 (H) 12/21/2012 1707   HDL 29 (L) 06/26/2020 0932   HDL 32 (L) 04/21/2013 1512   HDL 27 (L) 12/21/2012 1707   CHOLHDL 7.9 (H) 06/26/2020 0932   LDLCALC 153 (H) 06/26/2020 0932   LDLCALC 104 (H) 04/21/2013 1512   LDLCALC 76 12/21/2012 1707   LABVLDL 46 (H) 06/26/2020 0932     Patient Goals/Self-Care  Activities patient will:  - take medications as prescribed as evidenced by patient report and record review check glucose DAILY OR IF SYMPTOMATIC, document, and provide at future appointments collaborate with provider on medication access solutions target a minimum of 150 minutes of moderate intensity exercise weekly engage in dietary modifications by   FOLLOWING A HEART HEALTHY DIET/HEALTHY PLATE METHOD      Plan: The patient has been provided with contact information for the care management team and has been advised to call with any health related questions or concerns.    JRegina Eck PharmD, BCPS Clinical Pharmacist, [place] Phone [phone]

## 2022-03-26 ENCOUNTER — Telehealth: Payer: Self-pay

## 2022-03-26 NOTE — Telephone Encounter (Signed)
WORKING ON PA

## 2022-04-15 ENCOUNTER — Telehealth: Payer: Self-pay | Admitting: Family Medicine

## 2022-04-15 ENCOUNTER — Ambulatory Visit: Payer: Medicare HMO | Admitting: Family Medicine

## 2022-04-15 DIAGNOSIS — E039 Hypothyroidism, unspecified: Secondary | ICD-10-CM

## 2022-04-15 DIAGNOSIS — E1152 Type 2 diabetes mellitus with diabetic peripheral angiopathy with gangrene: Secondary | ICD-10-CM

## 2022-04-15 NOTE — Telephone Encounter (Signed)
Labs ordered- na

## 2022-04-15 NOTE — Telephone Encounter (Signed)
Patient aware via voicemail 

## 2022-04-27 ENCOUNTER — Other Ambulatory Visit: Payer: Self-pay | Admitting: Family Medicine

## 2022-04-27 DIAGNOSIS — T8740 Infection of amputation stump, unspecified extremity: Secondary | ICD-10-CM

## 2022-04-27 DIAGNOSIS — Z89511 Acquired absence of right leg below knee: Secondary | ICD-10-CM

## 2022-05-07 ENCOUNTER — Other Ambulatory Visit: Payer: Self-pay | Admitting: Family Medicine

## 2022-06-09 ENCOUNTER — Encounter: Payer: Self-pay | Admitting: Family Medicine

## 2022-06-09 ENCOUNTER — Ambulatory Visit (INDEPENDENT_AMBULATORY_CARE_PROVIDER_SITE_OTHER): Payer: Medicare HMO | Admitting: Family Medicine

## 2022-06-09 VITALS — BP 124/74 | HR 84 | Temp 97.3°F | Ht 70.0 in | Wt 233.0 lb

## 2022-06-09 DIAGNOSIS — I152 Hypertension secondary to endocrine disorders: Secondary | ICD-10-CM | POA: Diagnosis not present

## 2022-06-09 DIAGNOSIS — E1159 Type 2 diabetes mellitus with other circulatory complications: Secondary | ICD-10-CM

## 2022-06-09 DIAGNOSIS — K449 Diaphragmatic hernia without obstruction or gangrene: Secondary | ICD-10-CM | POA: Diagnosis not present

## 2022-06-09 DIAGNOSIS — E1151 Type 2 diabetes mellitus with diabetic peripheral angiopathy without gangrene: Secondary | ICD-10-CM | POA: Diagnosis not present

## 2022-06-09 DIAGNOSIS — M609 Myositis, unspecified: Secondary | ICD-10-CM | POA: Diagnosis not present

## 2022-06-09 DIAGNOSIS — E785 Hyperlipidemia, unspecified: Secondary | ICD-10-CM

## 2022-06-09 DIAGNOSIS — R7989 Other specified abnormal findings of blood chemistry: Secondary | ICD-10-CM

## 2022-06-09 DIAGNOSIS — E1122 Type 2 diabetes mellitus with diabetic chronic kidney disease: Secondary | ICD-10-CM

## 2022-06-09 DIAGNOSIS — E1169 Type 2 diabetes mellitus with other specified complication: Secondary | ICD-10-CM

## 2022-06-09 DIAGNOSIS — Z89511 Acquired absence of right leg below knee: Secondary | ICD-10-CM | POA: Diagnosis not present

## 2022-06-09 DIAGNOSIS — E1152 Type 2 diabetes mellitus with diabetic peripheral angiopathy with gangrene: Secondary | ICD-10-CM | POA: Diagnosis not present

## 2022-06-09 DIAGNOSIS — T466X5A Adverse effect of antihyperlipidemic and antiarteriosclerotic drugs, initial encounter: Secondary | ICD-10-CM

## 2022-06-09 DIAGNOSIS — E039 Hypothyroidism, unspecified: Secondary | ICD-10-CM | POA: Diagnosis not present

## 2022-06-09 LAB — BAYER DCA HB A1C WAIVED: HB A1C (BAYER DCA - WAIVED): 8.8 % — ABNORMAL HIGH (ref 4.8–5.6)

## 2022-06-09 NOTE — Progress Notes (Addendum)
Subjective: CC:DM PCP: Janora Norlander, DO SNK:[id number] [name] is a 62 y.o. female presenting to clinic today for:  1. Type 2 Diabetes with hypertension, hyperlipidemia:  Patient has been previously treated with Ozempic, Rybelsus, Jardiance.  Both Ozempic and Rybelsus caused severe GI symptoms such that she could not keep food down.  The Jardiance caused yeast vaginitis.  She was on Janumet previously but could not tolerate secondary to nausea and diarrhea.  She notes that she recently discontinued her metformin because she was having nausea and vomiting roughly every 3 days.  She stopped it about a week ago and since then she has not had any recurrence of this.  She does know that she has a hiatal hernia and is not sure if that might be contributing so she would like a referral back to gastroenterology for further evaluation and management.  She is compliant with 51 units of Tresiba daily, Amaryl 4 mg daily, Cozaar 50 mg daily and Zetia as she is statin intolerant.  Last eye exam: needs Last foot exam: needs Last A1c:  Lab Results  Component Value Date   HGBA1C 8.0 (H) 01/12/2022   Nephropathy screen indicated?: needs Last flu, zoster and/or pneumovax:  Immunization History  Administered Date(s) Administered   PFIZER(Purple Top)SARS-COV-2 Vaccination 01/25/2020, 02/15/2020   Td 06/08/2005    ROS: Denies dizziness, LOC, polyuria, polydipsia, unintended weight loss/gain, foot ulcerations, numbness or tingling in extremities, shortness of breath or chest pain.    ROS: Per HPI  Allergies  Allergen Reactions   Aleve [Naproxen Sodium] Hives and Itching   Cortisone Swelling    Internal organs   Ozempic (0.25 Or 0.5 Mg-Dose) [Semaglutide(0.25 Or 0.'5mg'$ -Dos)] Diarrhea and Nausea Only   Prednisone Swelling    Makes internal organs swell   Statins Other (See Comments)    Myalgia even with Pravastatin   Vancomycin Itching   Janumet [Sitagliptin-Metformin Hcl]      Nausea/diarrhea    Semaglutide Diarrhea and Nausea And Vomiting    RYBELSUS --PATIENT CAN'Pam TOLERATE '3MG'$  DAILY   Past Medical History:  Diagnosis Date   Abscess 01/07/2021   Acute blood loss anemia    Arthritis    COPD (chronic obstructive pulmonary disease) (HCC)    Deep vein thrombosis (DVT) of both lower extremities (HCC) 01/05/2017   Depression    DM (diabetes mellitus) (HCC)    DVT (deep venous thrombosis) (HCC)    x5   Family history of colon cancer    GERD (gastroesophageal reflux disease)    Hyperlipemia    Hypertension    Hypothyroid    Mild protein-calorie malnutrition (HCC)    Obesity    Osteomyelitis of great toe of right foot ([place]) 07/04/2018   PVD (peripheral vascular disease) (HCC)    S/P unilateral BKA (below knee amputation), right (HCC)    Type 2 diabetes mellitus with diabetic peripheral angiopathy and gangrene, without long-term current use of insulin ([place]) 12/27/2018   Unilateral complete BKA, right, initial encounter ([place]) 07/11/2018   Unilateral complete BKA, right, subsequent encounter ([place])    Uterine cancer ([place])    UTI (urinary tract infection)    Wound dehiscence 01/24/2021    Current Outpatient Medications:    Accu-Chek Softclix Lancets lancets, Use as instructed to test BGs up to 3 times daily E11.52, Disp: 300 each, Rfl: 3   ascorbic acid (VITAMIN C) 500 MG tablet, Take 500 mg by mouth daily., Disp: , Rfl:    aspirin EC 81 MG EC tablet,  Take 1 tablet (81 mg total) by mouth daily., Disp: , Rfl:    Cholecalciferol (VITAMIN D3) 5000 units TABS, Take 5,000 Units by mouth daily., Disp: , Rfl:    clopidogrel (PLAVIX) 75 MG tablet, Take 1 tablet (75 mg total) by mouth daily., Disp: 90 tablet, Rfl: 3   diclofenac Sodium (VOLTAREN) 1 % GEL, APPLY 2 GRAMS UP TO 4 TIMES A DAY AS NEEDED, Disp: 300 g, Rfl: 1   escitalopram (LEXAPRO) 10 MG tablet, Take 1 tablet (10 mg total) by mouth daily., Disp: 90 tablet, Rfl: 3   ezetimibe (ZETIA) 10 MG tablet, Take 1 tablet  (10 mg total) by mouth daily., Disp: 90 tablet, Rfl: 3   furosemide (LASIX) 20 MG tablet, TAKE 1 TABLET DAILY, Disp: 90 tablet, Rfl: 0   gabapentin (NEURONTIN) 300 MG capsule, TAKE 1 CAPSULE AT BEDTIME, Disp: 30 capsule, Rfl: 2   glimepiride (AMARYL) 4 MG tablet, Take 1 tablet (4 mg total) by mouth daily before breakfast., Disp: 30 tablet, Rfl: 3   glucose blood (ACCU-CHEK GUIDE) test strip, Check BS up to 3 times daily Dx E11.52, Disp: 300 strip, Rfl: 3   insulin degludec (TRESIBA FLEXTOUCH) 100 UNIT/ML FlexTouch Pen, Inject 40-60 Units into the skin at bedtime., Disp: 54 mL, Rfl: 3   Insulin Pen Needle (NOVOFINE PLUS PEN NEEDLE) 32G X 4 MM MISC, UAD with tresiba E11.9, Disp: 100 each, Rfl: 3   levothyroxine (SYNTHROID) 100 MCG tablet, TAKE ONE TABLET ONCE DAILY BEFORE BREAKFAST, Disp: 90 tablet, Rfl: 1   losartan (COZAAR) 50 MG tablet, Take 1 tablet (50 mg total) by mouth daily., Disp: 90 tablet, Rfl: 3   metFORMIN (GLUCOPHAGE-XR) 500 MG 24 hr tablet, TAKE TWO TABLETS IN THE MORNING AND AT BEDTIME, Disp: 360 tablet, Rfl: 3   nitroGLYCERIN (NITRODUR - DOSED IN MG/24 HR) 0.2 mg/hr patch, Place 1 patch (0.2 mg total) onto the skin daily., Disp: 30 patch, Rfl: 12   pantoprazole (PROTONIX) 40 MG tablet, Take 1 tablet (40 mg total) by mouth daily., Disp: 90 tablet, Rfl: 3   Potassium 99 MG TABS, Take 99 mg by mouth in the morning., Disp: , Rfl:    sennosides-docusate sodium (SENOKOT-S) 8.6-50 MG tablet, Take 1 tablet by mouth in the morning., Disp: , Rfl:    Tiotropium Bromide Monohydrate (SPIRIVA RESPIMAT) 2.5 MCG/ACT AERS, Inhale 2 puffs into the lungs daily. (Patient taking differently: Inhale 2 puffs into the lungs 3 (three) times a week.), Disp: 8 g, Rfl: 0   tretinoin (RETIN-A) 0.05 % cream, APPLY TO AFFECTED AREAS AT BEDTIME, Disp: 45 g, Rfl: 3   triamcinolone cream (KENALOG) 0.1 %, APPLY TO THE AFFECTED AREA(S) TWICE A DAY FOR 10 DAYS, Disp: 30 g, Rfl: 0 Social History   Socioeconomic History    Marital status: Divorced    Spouse name: Geneticist, molecular   Number of children: 1   Years of education: 16   Highest education level: Master's degree (e.g., MA, MS, MEng, MEd, MSW, MBA)  Occupational History   Occupation: Product manager: [employer]   Occupation: retired  Tobacco Use   Smoking status: Every Day    Packs/day: 1.00    Years: 38.00    Total pack years: 38.00    Types: Cigarettes   Smokeless tobacco: Never  Vaping Use   Vaping Use: Never used  Substance and Sexual Activity   Alcohol use: No    Alcohol/week: 0.0 standard drinks of alcohol   Drug use: Yes  Frequency: 4.0 times per week    Types: Marijuana   Sexual activity: Not Currently    Birth control/protection: Surgical  Other Topics Concern   Not on file  Social History Narrative   Lives alone in one bedroom apartment - has a ramp outside - no stairs -handicap accessible bathroom   R BKA, but has prosthetic leg she uses to walk, drive, etc   Lots of family nearby, great relationships - lots of help when needed   Social Determinants of Health   Financial Resource Strain: Low Risk  (06/13/2021)   Overall Financial Resource Strain (CARDIA)    Difficulty of Paying Living Expenses: Not hard at all  Food Insecurity: No Food Insecurity (06/13/2021)   Hunger Vital Sign    Worried About Running Out of Food in the Last Year: Never true    [place] in the Last Year: Never true  Transportation Needs: No Transportation Needs (06/13/2021)   PRAPARE - Hydrologist (Medical): No    Lack of Transportation (Non-Medical): No  Physical Activity: Inactive (06/13/2021)   Exercise Vital Sign    Days of Exercise per Week: 0 days    Minutes of Exercise per Session: 0 min  Stress: No Stress Concern Present (06/12/2020)   [place]    Feeling of Stress : Not at all  Social Connections: Moderately Integrated (06/13/2021)   Social  Connection and Isolation Panel [NHANES]    Frequency of Communication with Friends and Family: More than three times a week    Frequency of Social Gatherings with Friends and Family: More than three times a week    Attends Religious Services: 1 to 4 times per year    Active Member of [employer] or Organizations: Yes    Attends Archivist Meetings: 1 to 4 times per year    Marital Status: Divorced  Human resources officer Violence: Not At Risk (06/13/2021)   Humiliation, Afraid, Rape, and Kick questionnaire    Fear of Current or Ex-Partner: No    Emotionally Abused: No    Physically Abused: No    Sexually Abused: No   Family History  Problem Relation Age of Onset   Colon cancer Brother 25   Hypertension Mother    Hypothyroidism Mother    Heart disease Father    Diabetes Father    Hyperlipidemia Father    Clotting disorder Brother    Diabetes Brother    Heart disease Brother     Objective: Office vital signs reviewed. BP 124/74   Pulse 84   Temp (!) 97.3 F (36.3 C) (Temporal)   Ht '5\' 10"'$  (1.778 m)   Wt 233 lb (105.7 kg)   SpO2 99%   BMI 33.43 kg/m   Physical Examination General: Awake, alert, well nourished, No acute distress HEENT: sclera white, MMM Cardio: regular rate and rhythm, S1S2 heard, no murmurs appreciated Pulm: clear to auscultation bilaterally, no wheezes, rhonchi or rales; normal work of breathing on room air MSK: Right lower extremity is surgically absent.  She is wearing a prosthetic today Neuro: see DM foot Diabetic Foot Exam - Simple   Simple Foot Form Diabetic Foot exam was performed with the following findings: Yes 06/09/2022  3:14 PM  Visual Inspection See comments: Yes Sensation Testing Intact to touch and monofilament testing bilaterally: Yes Pulse Check Posterior Tibialis and Dorsalis pulse intact bilaterally: Yes Comments Plantar callus noted along the medial aspect of  the left great toe.  Right foot is surgically absent.      Assessment/  Plan: 62 y.o. female   Type 2 diabetes mellitus with diabetic peripheral angiopathy and gangrene, without long-term current use of insulin ([place]) - Plan: Lipid Panel, Bayer DCA Hb A1c Waived, AMB Referral to Chronic Care Management Services  Hyperlipidemia associated with type 2 diabetes mellitus ([place]) - Plan: AMB Referral to Chronic Care Management Services  Statin-induced myositis - Plan: AMB Referral to Chronic Care Management Services  Hypertension associated with diabetes ([place]) - Plan: AMB Referral to Chronic Care Management Services  Acquired hypothyroidism - Plan: T4, Free, TSH, TSH, T4, free  Acquired absence of right leg below knee ([place]) - Plan: AMB Referral to Chronic Care Management Services  Sliding hiatal hernia - Plan: Ambulatory referral to Gastroenterology  Elevated TSH - Plan: TSH, T4, free  Sugar is not at goal and unfortunately rising with A1c up to 8.8 today.  I think that we should consider revisiting either Januvia or similar.  We could also trial Trulicity or Mounjaro.  However, patient will certainly need patient assistance regardless of what the medication is that we prescribed.  It sounds like perhaps the metformin is what is causing the severe GI symptoms but we are going to let her stay off of the medication for a couple of weeks to see if this recurs while off metformin.  If it does we will reinitiate metformin as this is clearly not the cause and more likely to be related to her hiatal hernia history.  I have placed a new referral to GI to follow-up on this as well.  She will continue Zetia as she is statin intolerant  Blood pressure is controlled.  No changes  Check thyroid levels  Foot exam was performed of the left lower extremity which demonstrated no abnormalities.  She has acquired absence of that right lower extremity  No orders of the defined types were placed in this encounter.  No orders of the defined types were placed in this  encounter.    Janora Norlander, DO [place] [phone]

## 2022-06-10 LAB — TSH: TSH: 4.68 u[IU]/mL — ABNORMAL HIGH (ref 0.450–4.500)

## 2022-06-10 LAB — LIPID PANEL
Chol/HDL Ratio: 6.1 ratio — ABNORMAL HIGH (ref 0.0–4.4)
Cholesterol, Total: 206 mg/dL — ABNORMAL HIGH (ref 100–199)
HDL: 34 mg/dL — ABNORMAL LOW (ref >39)
LDL Chol Calc (NIH): 112 mg/dL — ABNORMAL HIGH (ref 0–99)
Triglycerides: 348 mg/dL — ABNORMAL HIGH (ref 0–149)
VLDL Cholesterol Cal: 60 mg/dL — ABNORMAL HIGH (ref 5–40)

## 2022-06-10 LAB — T4, FREE: Free T4: 1.12 ng/dL (ref 0.82–1.77)

## 2022-06-10 NOTE — Addendum Note (Signed)
Addended by: Everlean Cherry on: 06/10/2022 04:35 PM   Modules accepted: Orders

## 2022-06-17 ENCOUNTER — Other Ambulatory Visit: Payer: Self-pay | Admitting: Family Medicine

## 2022-06-17 ENCOUNTER — Telehealth: Payer: Self-pay | Admitting: Pharmacist

## 2022-06-17 ENCOUNTER — Ambulatory Visit (INDEPENDENT_AMBULATORY_CARE_PROVIDER_SITE_OTHER): Payer: Medicare HMO

## 2022-06-17 ENCOUNTER — Encounter: Payer: Self-pay | Admitting: Family Medicine

## 2022-06-17 VITALS — Ht 70.0 in | Wt 227.0 lb

## 2022-06-17 DIAGNOSIS — Z1211 Encounter for screening for malignant neoplasm of colon: Secondary | ICD-10-CM

## 2022-06-17 DIAGNOSIS — Z0000 Encounter for general adult medical examination without abnormal findings: Secondary | ICD-10-CM

## 2022-06-17 DIAGNOSIS — Z1231 Encounter for screening mammogram for malignant neoplasm of breast: Secondary | ICD-10-CM

## 2022-06-17 NOTE — Telephone Encounter (Signed)
Patient called to notify her of appt with pharmd 1/18

## 2022-06-17 NOTE — Progress Notes (Signed)
Subjective:   [name] is a 62 y.o. female who presents for Medicare Annual (Subsequent) preventive examination. I connected with  Hosie Spangle on 06/17/22 by a audio enabled telemedicine application and verified that I am speaking with the correct person using two identifiers.  Patient Location: Home  Provider Location: Home Office  I discussed the limitations of evaluation and management by telemedicine. The patient expressed understanding and agreed to proceed.  Review of Systems     Cardiac Risk Factors include: advanced age (>79mn, >>41women);diabetes mellitus;hypertension     Objective:    Today's Vitals   06/17/22 1121  Weight: 227 lb (103 kg)  Height: '5\' 10"'$  (1.778 m)   Body mass index is 32.57 kg/m.     06/17/2022   11:28 AM 06/13/2021    1:45 PM 01/24/2021    9:20 AM 10/09/2020    2:53 PM 08/09/2020   10:45 AM 07/19/2020   12:23 PM 07/19/2020   12:19 PM  Advanced Directives  Does Patient Have a Medical Advance Directive? No No No No No No No  Would patient like information on creating a medical advance directive? No - Patient declined Yes (MAU/Ambulatory/Procedural Areas - Information given) No - Patient declined  No - Patient declined No - Patient declined No - Patient declined    Current Medications (verified) Outpatient Encounter Medications as of 06/17/2022  Medication Sig   Accu-Chek Softclix Lancets lancets Use as instructed to test BGs up to 3 times daily E11.52   ascorbic acid (VITAMIN C) 500 MG tablet Take 500 mg by mouth daily.   aspirin EC 81 MG EC tablet Take 1 tablet (81 mg total) by mouth daily.   Cholecalciferol (VITAMIN D3) 5000 units TABS Take 5,000 Units by mouth daily.   clopidogrel (PLAVIX) 75 MG tablet Take 1 tablet (75 mg total) by mouth daily.   diclofenac Sodium (VOLTAREN) 1 % GEL APPLY 2 GRAMS UP TO 4 TIMES A DAY AS NEEDED   escitalopram (LEXAPRO) 10 MG tablet Take 1 tablet (10 mg total) by mouth daily.   ezetimibe (ZETIA) 10 MG  tablet Take 1 tablet (10 mg total) by mouth daily.   furosemide (LASIX) 20 MG tablet TAKE 1 TABLET DAILY   gabapentin (NEURONTIN) 300 MG capsule TAKE 1 CAPSULE AT BEDTIME   glimepiride (AMARYL) 4 MG tablet Take 1 tablet (4 mg total) by mouth daily before breakfast.   glucose blood (ACCU-CHEK GUIDE) test strip Check BS up to 3 times daily Dx E11.52   insulin degludec (TRESIBA FLEXTOUCH) 100 UNIT/ML FlexTouch Pen Inject 40-60 Units into the skin at bedtime.   Insulin Pen Needle (NOVOFINE PLUS PEN NEEDLE) 32G X 4 MM MISC UAD with tresiba E11.9   levothyroxine (SYNTHROID) 100 MCG tablet TAKE ONE TABLET ONCE DAILY BEFORE BREAKFAST   losartan (COZAAR) 50 MG tablet Take 1 tablet (50 mg total) by mouth daily.   metFORMIN (GLUCOPHAGE-XR) 500 MG 24 hr tablet TAKE TWO TABLETS IN THE MORNING AND AT BEDTIME   nitroGLYCERIN (NITRODUR - DOSED IN MG/24 HR) 0.2 mg/hr patch Place 1 patch (0.2 mg total) onto the skin daily.   pantoprazole (PROTONIX) 40 MG tablet Take 1 tablet (40 mg total) by mouth daily.   Potassium 99 MG TABS Take 99 mg by mouth in the morning.   sennosides-docusate sodium (SENOKOT-S) 8.6-50 MG tablet Take 1 tablet by mouth in the morning.   Tiotropium Bromide Monohydrate (SPIRIVA RESPIMAT) 2.5 MCG/ACT AERS Inhale 2 puffs into the lungs daily. (  Patient taking differently: Inhale 2 puffs into the lungs 3 (three) times a week.)   tretinoin (RETIN-A) 0.05 % cream APPLY TO AFFECTED AREAS AT BEDTIME   triamcinolone cream (KENALOG) 0.1 % APPLY TO THE AFFECTED AREA(S) TWICE A DAY FOR 10 DAYS   No facility-administered encounter medications on file as of 06/17/2022.    Allergies (verified) Aleve [naproxen sodium], Cortisone, Ozempic (0.25 or 0.5 mg-dose) [semaglutide(0.25 or 0.'5mg'$ -dos)], Prednisone, Statins, Vancomycin, Janumet [sitagliptin-metformin hcl], and Semaglutide   History: Past Medical History:  Diagnosis Date   Abscess 01/07/2021   Acute blood loss anemia    Arthritis    COPD (chronic  obstructive pulmonary disease) (HCC)    Deep vein thrombosis (DVT) of both lower extremities ([place]) 01/05/2017   Depression    DM (diabetes mellitus) ([place])    DVT (deep venous thrombosis) (HCC)    x5   Family history of colon cancer    GERD (gastroesophageal reflux disease)    Hyperlipemia    Hypertension    Hypothyroid    Mild protein-calorie malnutrition (HCC)    Obesity    Osteomyelitis of great toe of right foot ([place]) 07/04/2018   PVD (peripheral vascular disease) ([place])    S/P unilateral BKA (below knee amputation), right (HCC)    Type 2 diabetes mellitus with diabetic peripheral angiopathy and gangrene, without long-term current use of insulin ([place]) 12/27/2018   Unilateral complete BKA, right, initial encounter ([place]) 07/11/2018   Unilateral complete BKA, right, subsequent encounter ([place])    Uterine cancer ([place])    UTI (urinary tract infection)    Wound dehiscence 01/24/2021   Past Surgical History:  Procedure Laterality Date   ABDOMINAL HYSTERECTOMY     AMPUTATION Right 07/08/2018   Procedure: RIGHT BELOW KNEE AMPUTATION;  Surgeon: [name], MD;  Location: [place];  Service: Orthopedics;  Laterality: Right;   CESAREAN SECTION     FEMORAL BYPASS     x 5   LUMBAR DISC SURGERY     L4-L5   STUMP REVISION Right 01/24/2021   Procedure: right below the knee amputation revision;  Surgeon: [name], MD;  Location: [place];  Service: Orthopedics;  Laterality: Right;   TOE AMPUTATION     right   TONSILLECTOMY     Family History  Problem Relation Age of Onset   Colon cancer Brother 54   Hypertension Mother    Hypothyroidism Mother    Heart disease Father    Diabetes Father    Hyperlipidemia Father    Clotting disorder Brother    Diabetes Brother    Heart disease Brother    Social History   Socioeconomic History   Marital status: Divorced    Spouse name: Danny   Number of children: 1   Years of education: 16   Highest education level: Conservator, museum/gallery (e.g., MA, MS,  MEng, MEd, MSW, MBA)  Occupational History   Occupation: Product manager: Art gallery manager   Occupation: retired  Tobacco Use   Smoking status: Every Day    Packs/day: 1.00    Years: 38.00    Total pack years: 38.00    Types: Cigarettes   Smokeless tobacco: Never  Vaping Use   Vaping Use: Never used  Substance and Sexual Activity   Alcohol use: No    Alcohol/week: 0.0 standard drinks of alcohol   Drug use: Yes    Frequency: 4.0 times per week    Types: Marijuana   Sexual activity: Not Currently  Birth control/protection: Surgical  Other Topics Concern   Not on file  Social History Narrative   Lives alone in one bedroom apartment - has a ramp outside - no stairs -handicap accessible bathroom   R BKA, but has prosthetic leg she uses to walk, drive, etc   Lots of family nearby, great relationships - lots of help when needed   Social Determinants of Health   Financial Resource Strain: Low Risk  (06/17/2022)   Overall Financial Resource Strain (CARDIA)    Difficulty of Paying Living Expenses: Not hard at all  Food Insecurity: Food Insecurity Present (06/17/2022)   Hunger Vital Sign    Worried About [place] in the Last Year: Sometimes true    Ran Out of Food in the Last Year: Never true  Transportation Needs: No Transportation Needs (06/17/2022)   PRAPARE - Hydrologist (Medical): No    Lack of Transportation (Non-Medical): No  Physical Activity: Inactive (06/17/2022)   Exercise Vital Sign    Days of Exercise per Week: 0 days    Minutes of Exercise per Session: 0 min  Stress: No Stress Concern Present (06/17/2022)   [place]    Feeling of Stress : Not at all  Social Connections: Moderately Isolated (06/17/2022)   Social Connection and Isolation Panel [NHANES]    Frequency of Communication with Friends and Family: More than three times a week    Frequency of  Social Gatherings with Friends and Family: More than three times a week    Attends Religious Services: More than 4 times per year    Active Member of [employer] or Organizations: No    Attends Music therapist: Never    Marital Status: Divorced    Tobacco Counseling Ready to quit: No Counseling given: Not Answered   Clinical Intake:  Pre-visit preparation completed: Yes  Pain : No/denies pain     Nutritional Risks: None Diabetes: Yes CBG done?: No Did pt. bring in CBG monitor from home?: No  How often do you need to have someone help you when you read instructions, pamphlets, or other written materials from your doctor or pharmacy?: 1 - Never  Diabetic?yes Nutrition Risk Assessment:  Has the patient had any N/V/D within the last 2 months?  No  Does the patient have any non-healing wounds?  No  Has the patient had any unintentional weight loss or weight gain?  No   Diabetes:  Is the patient diabetic?  Yes  If diabetic, was a CBG obtained today?  No  Did the patient bring in their glucometer from home?  No  How often do you monitor your CBG's? 3 x day .   Financial Strains and Diabetes Management:  Are you having any financial strains with the device, your supplies or your medication? No .  Does the patient want to be seen by Chronic Care Management for management of their diabetes?  No  Would the patient like to be referred to a Nutritionist or for Diabetic Management?  No   Diabetic Exams:  Diabetic Eye Exam: Overdue for diabetic eye exam. Pt has been advised about the importance in completing this exam. Patient advised to call and schedule an eye exam. Diabetic Foot Exam: Overdue, Pt has been advised about the importance in completing this exam. Pt is scheduled for diabetic foot exam on next office visit .   Interpreter Needed?: No  Information entered by ::  Jadene Pierini, LPN   Activities of Daily Living    06/17/2022   11:28 AM  In your present  state of health, do you have any difficulty performing the following activities:  Hearing? 0  Vision? 0  Difficulty concentrating or making decisions? 0  Walking or climbing stairs? 0  Dressing or bathing? 0  Doing errands, shopping? 0  Preparing Food and eating ? N  Using the Toilet? N  In the past six months, have you accidently leaked urine? N  Do you have problems with loss of bowel control? N  Managing your Medications? N  Managing your Finances? N  Housekeeping or managing your Housekeeping? N    Patient Care Team: Janora Norlander, DO as PCP - General (Family Medicine) [name], MD as Consulting Physician (Orthopedic Surgery) Celestia Khat, [place] (Optometry) Blanca Friend Royce Macadamia, [facility] as Pharmacist (Family Medicine)  Indicate any recent Medical Services you may have received from other than [facility] providers in the past year (date may be approximate).     Assessment:   This is a routine wellness examination for [place].  Hearing/Vision screen Vision Screening - Comments:: Patient to schedule with Dr.Lee  Dietary issues and exercise activities discussed: Current Exercise Habits: The patient does not participate in regular exercise at present, Exercise limited by: orthopedic condition(s)   Goals Addressed               This Visit's Progress     Increase physical activity (pt-stated)   On track     "I want to get to walking again after my BKA of the right leg"       Depression Screen    06/17/2022   11:26 AM 06/09/2022    2:32 PM 10/06/2021    2:25 PM 07/08/2021    2:56 PM 06/13/2021    1:42 PM 04/07/2021    1:56 PM 12/31/2020    1:39 PM  PHQ 2/9 Scores  PHQ - 2 Score 0 0 0 0 0 0 3  PHQ- 9 Score 0 0    0 12    Fall Risk    06/17/2022   11:23 AM 06/09/2022    2:32 PM 10/06/2021    2:25 PM 07/08/2021    2:55 PM 06/13/2021    1:46 PM  [place] in the past year? 0 0 0 1 1  Number falls in past yr: 0   1 1  Injury with Fall? 0   1 0  Risk for fall due to  : No Fall Risks   History of fall(s);Medication side effect;Orthopedic patient History of fall(s);Impaired mobility;Medication side effect;Orthopedic patient  Follow up Falls prevention discussed   Education provided;Falls prevention discussed Education provided;Falls prevention discussed    FALL RISK PREVENTION PERTAINING TO THE HOME:  Any stairs in or around the home? No  If so, are there any without handrails? No  Home free of loose throw rugs in walkways, pet beds, electrical cords, etc? Yes  Adequate lighting in your home to reduce risk of falls? Yes   ASSISTIVE DEVICES UTILIZED TO PREVENT FALLS:  Life alert? No  Use of a cane, walker or w/c? No  Grab bars in the bathroom? Yes  Shower chair or bench in shower? Yes  Elevated toilet seat or a handicapped toilet? Yes        06/17/2022   11:28 AM 06/13/2021    1:29 PM 06/12/2020    1:53 PM 11/24/2018    9:39  AM  6CIT Screen  What Year? 0 points 0 points 0 points 0 points  What month? 0 points 0 points 0 points 0 points  What time? 0 points 0 points 0 points 0 points  Count back from 20 0 points 0 points 0 points 0 points  Months in reverse 0 points 0 points 0 points 0 points  Repeat phrase 0 points 0 points 0 points 0 points  Total Score 0 points 0 points 0 points 0 points    Immunizations Immunization History  Administered Date(s) Administered   PFIZER(Purple Top)SARS-COV-2 Vaccination 01/25/2020, 02/15/2020   Td 06/08/2005    TDAP status: Due, Education has been provided regarding the importance of this vaccine. Advised may receive this vaccine at local pharmacy or Health Dept. Aware to provide a copy of the vaccination record if obtained from local pharmacy or Health Dept. Verbalized acceptance and understanding.  Flu Vaccine status: Due, Education has been provided regarding the importance of this vaccine. Advised may receive this vaccine at local pharmacy or Health Dept. Aware to provide a copy of the vaccination record  if obtained from local pharmacy or Health Dept. Verbalized acceptance and understanding.  Pneumococcal vaccine status: Due, Education has been provided regarding the importance of this vaccine. Advised may receive this vaccine at local pharmacy or Health Dept. Aware to provide a copy of the vaccination record if obtained from local pharmacy or Health Dept. Verbalized acceptance and understanding.  Covid-19 vaccine status: Completed vaccines  Qualifies for Shingles Vaccine? Yes   Zostavax completed No   Shingrix Completed?: No.    Education has been provided regarding the importance of this vaccine. Patient has been advised to call insurance company to determine out of pocket expense if they have not yet received this vaccine. Advised may also receive vaccine at local pharmacy or Health Dept. Verbalized acceptance and understanding.  Screening Tests Health Maintenance  Topic Date Due   Diabetic kidney evaluation - Urine ACR  Never done   Lung Cancer Screening  07/20/2012   DTaP/Tdap/Td (2 - Tdap) 06/09/2015   COLONOSCOPY (Pts 45-59yr Insurance coverage will need to be confirmed)  07/01/2017   COVID-19 Vaccine (3 - Pfizer risk series) 03/14/2020   MAMMOGRAM  04/23/2022   OPHTHALMOLOGY EXAM  05/13/2022   Zoster Vaccines- Shingrix (1 of 2) 07/15/2022 (Originally 07/20/1979)   INFLUENZA VACCINE  09/06/2022 (Originally 01/06/2022)   Diabetic kidney evaluation - eGFR measurement  10/07/2022   HEMOGLOBIN A1C  12/08/2022   FOOT EXAM  06/10/2023   Medicare Annual Wellness (AWV)  06/18/2023   Hepatitis C Screening  Completed   HIV Screening  Completed   HPV VACCINES  Aged Out   PAP SMEAR-Modifier  Discontinued    Health Maintenance  Health Maintenance Due  Topic Date Due   Diabetic kidney evaluation - Urine ACR  Never done   Lung Cancer Screening  07/20/2012   DTaP/Tdap/Td (2 - Tdap) 06/09/2015   COLONOSCOPY (Pts 45-464yrInsurance coverage will need to be confirmed)  07/01/2017   COVID-19  Vaccine (3 - Pfizer risk series) 03/14/2020   MAMMOGRAM  04/23/2022   OPHTHALMOLOGY EXAM  05/13/2022    Colorectal cancer screening: Referral to GI placed 06/17/2022. Pt aware the office will call re: appt.  Mammogram status: Ordered 06/17/2022. Pt provided with contact info and advised to call to schedule appt.   Bone Density status: Ordered not of age . Pt provided with contact info and advised to call to schedule appt.  Lung Cancer Screening: (  Low Dose CT Chest recommended if Age 8-80 years, 30 pack-year currently smoking OR have quit w/in 15years.) does not qualify.   Lung Cancer Screening Referral: n/a  Additional Screening:  Hepatitis C Screening: does not qualify;   Vision Screening: Recommended annual ophthalmology exams for early detection of glaucoma and other disorders of the eye. Is the patient up to date with their annual eye exam?  No  Who is the provider or what is the name of the office in which the patient attends annual eye exams? Patient to call schedule  If pt is not established with a provider, would they like to be referred to a provider to establish care? No .   Dental Screening: Recommended annual dental exams for proper oral hygiene  Community Resource Referral / Chronic Care Management: CRR required this visit?  No   CCM required this visit?  No      Plan:     I have personally reviewed and noted the following in the patient's chart:   Medical and social history Use of alcohol, tobacco or illicit drugs  Current medications and supplements including opioid prescriptions. Patient is not currently taking opioid prescriptions. Functional ability and status Nutritional status Physical activity Advanced directives List of other physicians Hospitalizations, surgeries, and ER visits in previous 12 months Vitals Screenings to include cognitive, depression, and falls Referrals and appointments  In addition, I have reviewed and discussed with patient  certain preventive protocols, quality metrics, and best practice recommendations. A written personalized care plan for preventive services as well as general preventive health recommendations were provided to patient.     Daphane Shepherd, LPN   [id number]   Nurse Notes: Due Mammogram, Colonoscopy referral completed

## 2022-06-17 NOTE — Patient Instructions (Signed)
[name] , Thank you for taking time to come for your Medicare Wellness Visit. I appreciate your ongoing commitment to your health goals. Please review the following plan we discussed and let me know if I can assist you in the future.   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   Interventions: 1:1 collaboration with Janora Norlander, DO regarding development and update of comprehensive plan of care as evidenced by provider attestation and co-signature Inter-disciplinary care team collaboration (see longitudinal plan of care) Comprehensive medication review performed; medication list updated in electronic medical record  Diabetes: Goal on Track (progressing): YES. Uncontrolled--A1c 7.3%-->now back up to 8.0%; GFR 92; Current treatment: Tresiba 40-60 units daily; Metformin, glimepiride per patient preference Intolerances:  Ozempic, Rybelsus (GI), Jardiance (yeast) Patient couldn't tolerate Rybelsus '3mg'$ , reports 21lb weight loss in 1 months due to not being able to eat Discussed re-trial of SGLT2s/DPP4 combos; patient wishes to start back on glimepiride with metformin and do diet/lifestyle; adding insulin Hoping addition of CGM will allow patient to improve glycemic control Current glucose readings: Fasting glucose: 123-159, post prandial glucose: low 200s Patient now on insulin and interested in [place] 2 CGM Patient had libre 2 CGM delivered by [facility] (via parachute--only DME contracted  with patient's insurance) She reports $140 copay for 3 month supply; she did not receive reader device-->emailed [facility] & requested device by delivered  Reports the addition of red pickeled beets to diet--states it is helping her sugar; would watch sodium content Denies hypoglycemic/hyperglycemic symptoms Current exercise: n/a Discussed cholesterol and blood pressure  Hyperlipidemia -can only tolerate zetia (continue) -ICD codes documented -other therapies are cost prohibitive at this time; healthwell grant closed -needs repeat labs Lipid Panel     Component Value Date/Time   CHOL 228 (H) 06/26/2020 0932   CHOL 136 12/21/2012 1707   TRIG 249 (H) 06/26/2020 0932   TRIG 192 (H) 04/21/2013 1512   TRIG 164 (H) 12/21/2012 1707   HDL 29 (L) 06/26/2020 0932   HDL 32 (L) 04/21/2013 1512   HDL 27 (L) 12/21/2012 1707   CHOLHDL 7.9 (H) 06/26/2020 0932   LDLCALC 153 (H) 06/26/2020 0932   LDLCALC 104 (H) 04/21/2013 1512   LDLCALC 76 12/21/2012 1707   LABVLDL 46 (H) 06/26/2020 0932     Patient Goals/Self-Care Activities patient will:  - take medications as prescribed as evidenced by patient report and record review check glucose DAILY OR IF SYMPTOMATIC, document, and provide at future appointments collaborate with provider on medication access solutions target a minimum of 150 minutes of moderate intensity exercise weekly engage in dietary modifications by FOLLOWING A HEART HEALTHY DIET/HEALTHY PLATE METHOD        Weight (lb) < 230 lb (104.3 kg) (pt-stated)        This is a list of the screening recommended for you and due dates:  Health Maintenance  Topic Date Due  Yearly kidney health urinalysis for diabetes  Never done   Screening for Lung Cancer  07/20/2012   DTaP/Tdap/Td vaccine (2 - Tdap) 06/09/2015   Colon Cancer Screening  07/01/2017   COVID-19 Vaccine (3 - Pfizer risk series) 03/14/2020   Mammogram  04/23/2022   Eye exam for diabetics  05/13/2022   Zoster  (Shingles) Vaccine (1 of 2) 07/15/2022*   Flu Shot  09/06/2022*   Yearly kidney function blood test for diabetes  10/07/2022   Hemoglobin A1C  12/08/2022   Complete foot exam   06/10/2023   Medicare Annual Wellness Visit  06/18/2023   Hepatitis C Screening: USPSTF Recommendation to screen - Ages 18-79 yo.  Completed   HIV Screening  Completed   HPV Vaccine  Aged Out   Pap Smear  Discontinued  *Topic was postponed. The date shown is not the original due date.    Advanced directives: Advance directive discussed with you today. I have provided a copy for you to complete at home and have notarized. Once this is complete please bring a copy in to our office so we can scan it into your chart.   Conditions/risks identified: Aim for 30 minutes of exercise or brisk walking, 6-8 glasses of water, and 5 servings of fruits and vegetables each day.   Next appointment: Follow up in one year for your annual wellness visit    Preventive Care 65 Years and Older, Female Preventive care refers to lifestyle choices and visits with your health care provider that can promote health and wellness. What does preventive care include? A yearly physical exam. This is also called an annual well check. Dental exams once or twice a year. Routine eye exams. Ask your health care provider how often you should have your eyes checked. Personal lifestyle choices, including: Daily care of your teeth and gums. Regular physical activity. Eating a healthy diet. Avoiding tobacco and drug use. Limiting alcohol use. Practicing safe sex. Taking low-dose aspirin every day. Taking vitamin and mineral supplements as recommended by your health care provider. What happens during an annual well check? The services and screenings done by your health care provider during your annual well check will depend on your age, overall health, lifestyle risk factors, and family history of disease. Counseling  Your health care provider may  ask you questions about your: Alcohol use. Tobacco use. Drug use. Emotional well-being. Home and relationship well-being. Sexual activity. Eating habits. History of falls. Memory and ability to understand (cognition). Work and work Statistician. Reproductive health. Screening  You may have the following tests or measurements: Height, weight, and BMI. Blood pressure. Lipid and cholesterol levels. These may be checked every 5 years, or more frequently if you are over 6 years old. Skin check. Lung cancer screening. You may have this screening every year starting at age 63 if you have a 30-pack-year history of smoking and currently smoke or have quit within the past 15 years. Fecal occult blood test (FOBT) of the stool. You may have this test every year starting at age 5. Flexible sigmoidoscopy or colonoscopy. You may have a sigmoidoscopy every 5 years or a colonoscopy every 10 years starting at age 65. Hepatitis C blood test. Hepatitis B blood test. Sexually transmitted disease (STD) testing. Diabetes screening. This is done by checking your blood sugar (glucose) after you have not eaten for a while (fasting). You may have this done every 1-3 years. Bone density scan. This is done to screen for osteoporosis. You may have this  done starting at age 56. Mammogram. This may be done every 1-2 years. Talk to your health care provider about how often you should have regular mammograms. Talk with your health care provider about your test results, treatment options, and if necessary, the need for more tests. Vaccines  Your health care provider may recommend certain vaccines, such as: Influenza vaccine. This is recommended every year. Tetanus, diphtheria, and acellular pertussis (Tdap, Td) vaccine. You may need a Td booster every 10 years. Zoster vaccine. You may need this after age 18. Pneumococcal 13-valent conjugate (PCV13) vaccine. One dose is recommended after age 71. Pneumococcal  polysaccharide (PPSV23) vaccine. One dose is recommended after age 37. Talk to your health care provider about which screenings and vaccines you need and how often you need them. This information is not intended to replace advice given to you by your health care provider. Make sure you discuss any questions you have with your health care provider. Document Released: 06/21/2015 Document Revised: 02/12/2016 Document Reviewed: 03/26/2015 Elsevier Interactive Patient Education  2017 [employer] Prevention in the Home Falls can cause injuries. They can happen to people of all ages. There are many things you can do to make your home safe and to help prevent falls. What can I do on the outside of my home? Regularly fix the edges of walkways and driveways and fix any cracks. Remove anything that might make you trip as you walk through a door, such as a raised step or threshold. Trim any bushes or trees on the path to your home. Use bright outdoor lighting. Clear any walking paths of anything that might make someone trip, such as rocks or tools. Regularly check to see if handrails are loose or broken. Make sure that both sides of any steps have handrails. Any raised decks and porches should have guardrails on the edges. Have any leaves, snow, or ice cleared regularly. Use sand or salt on walking paths during winter. Clean up any spills in your garage right away. This includes oil or grease spills. What can I do in the bathroom? Use night lights. Install grab bars by the toilet and in the tub and shower. Do not use towel bars as grab bars. Use non-skid mats or decals in the tub or shower. If you need to sit down in the shower, use a plastic, non-slip stool. Keep the floor dry. Clean up any water that spills on the floor as soon as it happens. Remove soap buildup in the tub or shower regularly. Attach bath mats securely with double-sided non-slip rug tape. Do not have throw rugs and other  things on the floor that can make you trip. What can I do in the bedroom? Use night lights. Make sure that you have a light by your bed that is easy to reach. Do not use any sheets or blankets that are too big for your bed. They should not hang down onto the floor. Have a firm chair that has side arms. You can use this for support while you get dressed. Do not have throw rugs and other things on the floor that can make you trip. What can I do in the kitchen? Clean up any spills right away. Avoid walking on wet floors. Keep items that you use a lot in easy-to-reach places. If you need to reach something above you, use a strong step stool that has a grab bar. Keep electrical cords out of the way. Do not use floor polish or wax  that makes floors slippery. If you must use wax, use non-skid floor wax. Do not have throw rugs and other things on the floor that can make you trip. What can I do with my stairs? Do not leave any items on the stairs. Make sure that there are handrails on both sides of the stairs and use them. Fix handrails that are broken or loose. Make sure that handrails are as long as the stairways. Check any carpeting to make sure that it is firmly attached to the stairs. Fix any carpet that is loose or worn. Avoid having throw rugs at the top or bottom of the stairs. If you do have throw rugs, attach them to the floor with carpet tape. Make sure that you have a light switch at the top of the stairs and the bottom of the stairs. If you do not have them, ask someone to add them for you. What else can I do to help prevent falls? Wear shoes that: Do not have high heels. Have rubber bottoms. Are comfortable and fit you well. Are closed at the toe. Do not wear sandals. If you use a stepladder: Make sure that it is fully opened. Do not climb a closed stepladder. Make sure that both sides of the stepladder are locked into place. Ask someone to hold it for you, if possible. Clearly  mark and make sure that you can see: Any grab bars or handrails. First and last steps. Where the edge of each step is. Use tools that help you move around (mobility aids) if they are needed. These include: Canes. Walkers. Scooters. Crutches. Turn on the lights when you go into a dark area. Replace any light bulbs as soon as they burn out. Set up your furniture so you have a clear path. Avoid moving your furniture around. If any of your floors are uneven, fix them. If there are any pets around you, be aware of where they are. Review your medicines with your doctor. Some medicines can make you feel dizzy. This can increase your chance of falling. Ask your doctor what other things that you can do to help prevent falls. This information is not intended to replace advice given to you by your health care provider. Make sure you discuss any questions you have with your health care provider. Document Released: 03/21/2009 Document Revised: 10/31/2015 Document Reviewed: 06/29/2014 Elsevier Interactive Patient Education  2017 [employer].

## 2022-06-24 ENCOUNTER — Encounter: Payer: Self-pay | Admitting: Gastroenterology

## 2022-06-24 ENCOUNTER — Other Ambulatory Visit: Payer: Self-pay | Admitting: Family Medicine

## 2022-06-24 DIAGNOSIS — Z89511 Acquired absence of right leg below knee: Secondary | ICD-10-CM

## 2022-06-24 DIAGNOSIS — T8740 Infection of amputation stump, unspecified extremity: Secondary | ICD-10-CM

## 2022-06-25 ENCOUNTER — Ambulatory Visit (INDEPENDENT_AMBULATORY_CARE_PROVIDER_SITE_OTHER): Payer: Medicare HMO | Admitting: Pharmacist

## 2022-06-25 ENCOUNTER — Telehealth: Payer: Self-pay | Admitting: Pharmacist

## 2022-06-25 ENCOUNTER — Other Ambulatory Visit: Payer: Self-pay

## 2022-06-25 VITALS — BP 135/80

## 2022-06-25 DIAGNOSIS — E1165 Type 2 diabetes mellitus with hyperglycemia: Secondary | ICD-10-CM | POA: Diagnosis not present

## 2022-06-25 DIAGNOSIS — Z794 Long term (current) use of insulin: Secondary | ICD-10-CM

## 2022-06-25 DIAGNOSIS — G720 Drug-induced myopathy: Secondary | ICD-10-CM

## 2022-06-25 DIAGNOSIS — E1169 Type 2 diabetes mellitus with other specified complication: Secondary | ICD-10-CM

## 2022-06-25 DIAGNOSIS — R928 Other abnormal and inconclusive findings on diagnostic imaging of breast: Secondary | ICD-10-CM

## 2022-06-25 NOTE — Telephone Encounter (Signed)
Can you mail application for novo tresiba 70 units nightly; novolog 5-10 units 3x daily with meals Pen needles  Thanks!

## 2022-06-25 NOTE — Progress Notes (Signed)
    06/26/2022 Name: [name] MRN: [id number] DOB: 1961/05/11   S:  62 yoF Presents for diabetes evaluation, education, and management She has multiple intolerances to medications.  She is interested in alternative therapies to manage her chronic conditions  Insurance coverage/medication affordability: [employer]are  Patient denies adherence with medications due to intolerances. Current diabetes medications include: tresiba only Current hypertension medications include: losartan Goal 130/80 Current hyperlipidemia medications include: on zetia  allergy to atorvastatin, pravastatin, rosuvastatin STATIN ALLERGY/INTOLERANCE DOCUMENTED IN EMR g72    Patient denies hypoglycemic events.   Patient reported dietary habits: Eats 1 meals/day   O:  Lab Results  Component Value Date   HGBA1C 8.8 (H) 06/09/2022    Lipid Panel     Component Value Date/Time   CHOL 206 (H) 06/09/2022 1433   CHOL 136 12/21/2012 1707   TRIG 348 (H) 06/09/2022 1433   TRIG 192 (H) 04/21/2013 1512   TRIG 164 (H) 12/21/2012 1707   HDL 34 (L) 06/09/2022 1433   HDL 32 (L) 04/21/2013 1512   HDL 27 (L) 12/21/2012 1707   CHOLHDL 6.1 (H) 06/09/2022 1433   LDLCALC 112 (H) 06/09/2022 1433   LDLCALC 104 (H) 04/21/2013 1512   LDLCALC 76 12/21/2012 1707    Home fasting blood sugars: 200  2 hour post-meal/random blood sugars: up to 400.    Clinical Atherosclerotic Cardiovascular Disease (ASCVD): No   The 10-year ASCVD risk score (Arnett DK, et al., 2019) is: 24.6%   Values used to calculate the score:     Age: 62 years     Sex: Female     Is Non-Hispanic African American: No     Diabetic: Yes     Tobacco smoker: Yes     Systolic Blood Pressure: 466 mmHg     Is BP treated: Yes     HDL Cholesterol: 34 mg/dL     Total Cholesterol: 206 mg/dL    A/P:  Diabetes T2DM currently uncontrolled. Patient is unable to take rybelsus, ozempic, januvia and has now stopped metformin.  Beneficial options that  remain are meal time insulin or mounjaro. She remains on glimepiride which likely has very little benefit at this point. Also, would not recommend SGLT2 due to high blood sugars up to 400.  -Will try Mounjaro 2.'5mg'$  weekly samples to see if patient tolerates.  Denies personal and family history of Medullary thyroid cancer (MTC)  -increase insulin to 60-65 units nightly  PAP forms sent to patient for insulin  -explore hyperlipidemia at follow up  -spiriva samples given-encouraged patient to take daily (not just as needed)  -Extensively discussed pathophysiology of diabetes, recommended lifestyle interventions, dietary effects on blood sugar control  -Counseled on s/sx of and management of hypoglycemia  -Next A1C anticipated next PCP visit.   Written patient instructions provided.  Total time in face to face counseling 25 minutes.    Regina Eck, PharmD, BCPS, BCACP Clinical Pharmacist, [facility]  T [phone]

## 2022-06-26 ENCOUNTER — Encounter: Payer: Self-pay | Admitting: Pharmacist

## 2022-06-26 ENCOUNTER — Telehealth: Payer: Self-pay

## 2022-06-26 NOTE — Telephone Encounter (Signed)
Patient is overdue for follow up diagnostic mammogram - she will have to have diagnostic bilat this year instead of regular screening - have been unable to contact patient tp schedule appointment by phone or through mail. Certified letter was sent to patient on 06/17/2022 and appropriate orders have been placed

## 2022-06-29 ENCOUNTER — Other Ambulatory Visit (HOSPITAL_COMMUNITY): Payer: Self-pay

## 2022-07-01 ENCOUNTER — Emergency Department (HOSPITAL_COMMUNITY): Payer: Medicare HMO

## 2022-07-01 ENCOUNTER — Emergency Department (HOSPITAL_COMMUNITY)
Admission: EM | Admit: 2022-07-01 | Discharge: 2022-07-01 | Disposition: A | Discharge status: Home / Self Care | Payer: Medicare HMO | Attending: Emergency Medicine | Admitting: Emergency Medicine

## 2022-07-01 ENCOUNTER — Other Ambulatory Visit: Payer: Self-pay

## 2022-07-01 ENCOUNTER — Encounter (HOSPITAL_COMMUNITY): Payer: Self-pay

## 2022-07-01 ENCOUNTER — Encounter (INDEPENDENT_AMBULATORY_CARE_PROVIDER_SITE_OTHER): Payer: Medicare HMO | Admitting: Family Medicine

## 2022-07-01 DIAGNOSIS — D72829 Elevated white blood cell count, unspecified: Secondary | ICD-10-CM | POA: Insufficient documentation

## 2022-07-01 DIAGNOSIS — E039 Hypothyroidism, unspecified: Secondary | ICD-10-CM | POA: Diagnosis not present

## 2022-07-01 DIAGNOSIS — Z7982 Long term (current) use of aspirin: Secondary | ICD-10-CM | POA: Insufficient documentation

## 2022-07-01 DIAGNOSIS — R1013 Epigastric pain: Secondary | ICD-10-CM | POA: Insufficient documentation

## 2022-07-01 DIAGNOSIS — R609 Edema, unspecified: Secondary | ICD-10-CM | POA: Diagnosis not present

## 2022-07-01 DIAGNOSIS — Z794 Long term (current) use of insulin: Secondary | ICD-10-CM | POA: Diagnosis not present

## 2022-07-01 DIAGNOSIS — I10 Essential (primary) hypertension: Secondary | ICD-10-CM | POA: Insufficient documentation

## 2022-07-01 DIAGNOSIS — R1011 Right upper quadrant pain: Secondary | ICD-10-CM | POA: Diagnosis not present

## 2022-07-01 DIAGNOSIS — Z7984 Long term (current) use of oral hypoglycemic drugs: Secondary | ICD-10-CM | POA: Diagnosis not present

## 2022-07-01 DIAGNOSIS — J449 Chronic obstructive pulmonary disease, unspecified: Secondary | ICD-10-CM | POA: Diagnosis not present

## 2022-07-01 DIAGNOSIS — R140 Abdominal distension (gaseous): Secondary | ICD-10-CM

## 2022-07-01 DIAGNOSIS — Z8541 Personal history of malignant neoplasm of cervix uteri: Secondary | ICD-10-CM | POA: Diagnosis not present

## 2022-07-01 DIAGNOSIS — Z79899 Other long term (current) drug therapy: Secondary | ICD-10-CM | POA: Diagnosis not present

## 2022-07-01 DIAGNOSIS — R1930 Abdominal rigidity, unspecified site: Secondary | ICD-10-CM | POA: Diagnosis not present

## 2022-07-01 DIAGNOSIS — N3289 Other specified disorders of bladder: Secondary | ICD-10-CM | POA: Diagnosis not present

## 2022-07-01 DIAGNOSIS — Z7902 Long term (current) use of antithrombotics/antiplatelets: Secondary | ICD-10-CM | POA: Insufficient documentation

## 2022-07-01 DIAGNOSIS — K760 Fatty (change of) liver, not elsewhere classified: Secondary | ICD-10-CM | POA: Diagnosis not present

## 2022-07-01 DIAGNOSIS — Z7951 Long term (current) use of inhaled steroids: Secondary | ICD-10-CM | POA: Diagnosis not present

## 2022-07-01 DIAGNOSIS — E119 Type 2 diabetes mellitus without complications: Secondary | ICD-10-CM | POA: Insufficient documentation

## 2022-07-01 DIAGNOSIS — R112 Nausea with vomiting, unspecified: Secondary | ICD-10-CM

## 2022-07-01 DIAGNOSIS — R1084 Generalized abdominal pain: Secondary | ICD-10-CM | POA: Diagnosis not present

## 2022-07-01 DIAGNOSIS — R1012 Left upper quadrant pain: Secondary | ICD-10-CM | POA: Diagnosis not present

## 2022-07-01 DIAGNOSIS — N289 Disorder of kidney and ureter, unspecified: Secondary | ICD-10-CM | POA: Diagnosis not present

## 2022-07-01 LAB — CBC
HCT: 42 % (ref 36.0–46.0)
Hemoglobin: 13.3 g/dL (ref 12.0–15.0)
MCH: 26.2 pg (ref 26.0–34.0)
MCHC: 31.7 g/dL (ref 30.0–36.0)
MCV: 82.7 fL (ref 80.0–100.0)
Platelets: 294 10*3/uL (ref 150–400)
RBC: 5.08 MIL/uL (ref 3.87–5.11)
RDW: 17.1 % — ABNORMAL HIGH (ref 11.5–15.5)
WBC: 15.1 10*3/uL — ABNORMAL HIGH (ref 4.0–10.5)
nRBC: 0 % (ref 0.0–0.2)

## 2022-07-01 LAB — COMPREHENSIVE METABOLIC PANEL
ALT: 15 U/L (ref 0–44)
AST: 21 U/L (ref 15–41)
Albumin: 3.4 g/dL — ABNORMAL LOW (ref 3.5–5.0)
Alkaline Phosphatase: 54 U/L (ref 38–126)
Anion gap: 8 (ref 5–15)
BUN: 15 mg/dL (ref 6–20)
CO2: 25 mmol/L (ref 22–32)
Calcium: 9 mg/dL (ref 8.9–10.3)
Chloride: 102 mmol/L (ref 98–111)
Creatinine, Ser: 0.84 mg/dL (ref 0.44–1.00)
GFR, Estimated: >60 mL/min (ref >60)
Glucose, Bld: 136 mg/dL — ABNORMAL HIGH (ref 70–99)
Potassium: 3.7 mmol/L (ref 3.5–5.1)
Sodium: 135 mmol/L (ref 135–145)
Total Bilirubin: 0.5 mg/dL (ref 0.3–1.2)
Total Protein: 7.7 g/dL (ref 6.5–8.1)

## 2022-07-01 LAB — URINALYSIS, ROUTINE W REFLEX MICROSCOPIC
Bilirubin Urine: NEGATIVE
Glucose, UA: NEGATIVE mg/dL
Hgb urine dipstick: NEGATIVE
Ketones, ur: 5 mg/dL — AB
Leukocytes,Ua: NEGATIVE
Nitrite: NEGATIVE
Protein, ur: NEGATIVE mg/dL
Specific Gravity, Urine: 1.014 (ref 1.005–1.030)
pH: 6 (ref 5.0–8.0)

## 2022-07-01 LAB — LIPASE, BLOOD: Lipase: 36 U/L (ref 11–51)

## 2022-07-01 MED ORDER — IOHEXOL 300 MG/ML  SOLN
100.0000 mL | Freq: Once | INTRAMUSCULAR | Status: AC | PRN
  Administered 2022-07-01: 100 mL via INTRAVENOUS

## 2022-07-01 MED ORDER — ONDANSETRON HCL 4 MG/2ML IJ SOLN
4.0000 mg | Freq: Once | INTRAMUSCULAR | Status: AC
  Administered 2022-07-01: 4 mg via INTRAVENOUS
  Filled 2022-07-01: qty 2

## 2022-07-01 MED ORDER — METOCLOPRAMIDE HCL 10 MG PO TABS: 10.0000 mg | ORAL_TABLET | Freq: Four times a day (QID) | ORAL | 0 refills | Status: DC

## 2022-07-01 MED ORDER — SODIUM CHLORIDE 0.9 % IV BOLUS
1000.0000 mL | Freq: Once | INTRAVENOUS | Status: AC
  Administered 2022-07-01: 1000 mL via INTRAVENOUS

## 2022-07-01 NOTE — ED Triage Notes (Signed)
EMS states pt C/O abd pain, hasn't been able to eat since yesterday morning. Patient states she has had multiple ultrasounds that doesn't show anything. States she has had N/V/D with no appetite.

## 2022-07-01 NOTE — ED Notes (Signed)
Patient transported to CT 

## 2022-07-01 NOTE — Discharge Instructions (Addendum)
You were seen in the emergency department for abdominal pain and vomiting.  As we discussed, your blood work, urine sample, ultrasound, and CT imaging were all reassuring today.   You do have a small infrarenal abdominal aneurysm that should be followed up with ultrasound imaging every 2 years or so to ensure it doesn't grow in size -- make sure to discuss this with your doctor.   I am prescribing you nausea medication to help control your symptoms until you're able to see the gastroenterologist.   Continue to monitor how you're doing and return to the ER for new or worsening symptoms.

## 2022-07-01 NOTE — ED Provider Notes (Signed)
Pam Provider Note   CSN: [id number] Arrival date & time: 07/01/22  1429     History  Chief Complaint  Patient presents with   Abdominal Pain    [name] is a 62 y.o. female with a history of Cox, [name], [name], [name], [name], [name], [name], [name], [name], [name] department complaining of abdominal pain, nausea, vomiting, and diarrhea.  Currently on [facility] for Cox management, last dose on 6 days ago.  Symptoms she is experiencing today have been going on for 4 days.  She discussed with her primary doctor via chat messages this morning and they were going to schedule her for blood work and an ultrasound.  She came into the ER because the pain was worsening.  They had concerns for cholecystitis versus pancreatitis caused by [facility].  Patient herself says that she has been having intermittent vomiting and abdominal bloating for several years, so she is not sure if it is related to her recent medication change.  She reports having severe reactions to other similar medicines in the past, including Ozempic.  She said this time around that she started having abdominal bloating in her upper abdomen, and her stomach will "feel like a rock".  After that she normally has some projectile vomiting.  When she has had the symptoms in the past they have only lasted a day or so, but this is lasting for much longer.   Abdominal Pain Associated symptoms: nausea and vomiting   Associated symptoms: no chills, no constipation, no diarrhea, no dysuria, no fever and no hematuria        Home Medications Prior to Admission medications   Medication Sig Start Date End Date Taking? Authorizing Provider  metoCLOPramide (REGLAN) 10 MG tablet Take 1 tablet (10 mg total) by mouth every 6 (six) hours. 07/01/22  Yes Elajah Kunsman T, PA-C  Accu-Chek Softclix  Lancets lancets Use as instructed to test BGs up to 3 times daily E11.52 10/06/21   Ronnie Doss M, DO  ascorbic acid (VITAMIN C) 500 MG tablet Take 500 mg by mouth daily.    [provider]  aspirin EC 81 MG EC tablet Take 1 tablet (81 mg total) by mouth daily. 07/16/18   Love, Ivan Anchors, PA-C  Cholecalciferol (VITAMIN D3) 5000 units TABS Take 5,000 Units by mouth daily.    [provider]  clopidogrel (Cox) 75 MG tablet Take 1 tablet (75 mg total) by mouth daily. 01/13/22   Ronnie Doss M, DO  diclofenac Sodium (VOLTAREN) 1 % GEL APPLY 2 GRAMS UP TO 4 TIMES A DAY AS NEEDED 07/04/21   Ronnie Doss M, DO  escitalopram (LEXAPRO) 10 MG tablet Take 1 tablet (10 mg total) by mouth daily. 01/13/22   Janora Norlander, DO  ezetimibe (ZETIA) 10 MG tablet Take 1 tablet (10 mg total) by mouth daily. 01/13/22   Ronnie Doss M, DO  furosemide (LASIX) 20 MG tablet TAKE 1 TABLET DAILY 05/07/22   Ronnie Doss M, DO  gabapentin (NEURONTIN) 300 MG capsule TAKE 1 CAPSULE AT BEDTIME 05/05/21   Ronnie Doss M, DO  glimepiride (AMARYL) 4 MG tablet Take 1 tablet (4 mg total) by mouth daily before breakfast. 09/18/21   Ronnie Doss M, DO  glucose blood (ACCU-CHEK GUIDE) test strip Check BS up to 3 times daily Dx E11.52 10/06/21   Ronnie Doss M, DO  insulin degludec (TRESIBA FLEXTOUCH) 100 UNIT/ML FlexTouch  Pen Inject 40-60 Units into the skin at bedtime. 12/26/21   Ronnie Doss M, DO  Insulin Pen Needle (NOVOFINE PLUS PEN NEEDLE) 32G X 4 MM MISC UAD with tresiba E11.9 10/06/21   Ronnie Doss M, DO  levothyroxine (SYNTHROID) 100 MCG tablet TAKE ONE TABLET ONCE DAILY BEFORE BREAKFAST 06/17/22   Ronnie Doss M, DO  losartan (COZAAR) 50 MG tablet Take 1 tablet (50 mg total) by mouth daily. 01/13/22   Janora Norlander, DO  nitroGLYCERIN (NITRODUR - DOSED IN MG/24 HR) 0.2 mg/hr patch Place 1 patch (0.2 mg total) onto the skin daily. 07/02/21   Suzan Slick, NP   pantoprazole (PROTONIX) 40 MG tablet Take 1 tablet (40 mg total) by mouth daily. 01/13/22   Janora Norlander, DO  Potassium 99 MG TABS Take 99 mg by mouth in the morning.    [provider]  sennosides-docusate sodium (SENOKOT-S) 8.6-50 MG tablet Take 1 tablet by mouth in the morning.    [provider]  Tiotropium Bromide Monohydrate (SPIRIVA RESPIMAT) 2.5 MCG/ACT AERS Inhale 2 puffs into the lungs daily. Patient taking differently: Inhale 2 puffs into the lungs 3 (three) times a week. 12/31/20   Ronnie Doss M, DO  tretinoin (RETIN-A) 0.05 % cream APPLY TO AFFECTED AREAS AT BEDTIME 07/04/21   Ronnie Doss M, DO  triamcinolone cream (KENALOG) 0.1 % APPLY TO THE AFFECTED AREA(S) TWICE A DAY FOR 10 DAYS 12/10/21   Claretta Fraise, MD      Allergies    Aleve [naproxen sodium], Cortisone, Ozempic (0.25 or 0.5 mg-dose) [semaglutide(0.25 or 0.'5mg'$ -dos)], Prednisone, Statins, Vancomycin, Janumet [sitagliptin-metformin hcl], and Semaglutide    Review of Systems   Review of Systems  Constitutional:  Negative for chills and fever.  Gastrointestinal:  Positive for abdominal pain, nausea and vomiting. Negative for constipation and diarrhea.  Genitourinary:  Positive for difficulty urinating. Negative for dysuria and hematuria.  All other systems reviewed and are negative.   Physical Exam Updated Vital Signs BP 117/76   Pulse 71   Temp 98 F (36.7 C) (Oral)   Resp 19   Ht '5\' 10"'$  (1.778 m)   Wt 103 kg   SpO2 94%   BMI 32.57 kg/m  Physical Exam Vitals and nursing note reviewed.  Constitutional:      Appearance: Normal appearance.  HENT:     Head: Normocephalic and atraumatic.  Eyes:     Conjunctiva/sclera: Conjunctivae normal.  Cardiovascular:     Rate and Rhythm: Normal rate and regular rhythm.  Pulmonary:     Effort: Pulmonary effort is normal. No respiratory distress.     Breath sounds: Normal breath sounds.  Abdominal:     General: There is no distension.      Palpations: Abdomen is soft.     Tenderness: There is abdominal tenderness in the right upper quadrant, epigastric area and left upper quadrant. There is no guarding or rebound.  Skin:    General: Skin is warm and dry.  Neurological:     General: No focal deficit present.     Mental Status: She is alert.     ED Results / Procedures / Treatments   Labs (all labs ordered are listed, but only abnormal results are displayed) Labs Reviewed  COMPREHENSIVE METABOLIC PANEL - Abnormal; Notable for the following components:      Result Value   Glucose, Bld 136 (*)    Albumin 3.4 (*)    All other components within normal limits  CBC -  Abnormal; Notable for the following components:   WBC 15.1 (*)    RDW 17.1 (*)    All other components within normal limits  URINALYSIS, ROUTINE W REFLEX MICROSCOPIC - Abnormal; Notable for the following components:   Ketones, ur 5 (*)    All other components within normal limits  LIPASE, BLOOD    EKG EKG Interpretation  Date/Time:  Wednesday July 01 2022 14:38:02 EST Ventricular Rate:  76 PR Interval:  174 QRS Duration: 88 QT Interval:  414 QTC Calculation: 466 R Axis:   -16 Text Interpretation: Sinus rhythm Ventricular premature complex Borderline left axis deviation Low voltage, precordial leads Anteroseptal infarct, old Confirmed by Godfrey Pick [phone]) on 07/01/2022 4:19:28 PM  Radiology CT ABDOMEN PELVIS W CONTRAST  Result Date: 07/01/2022 CLINICAL DATA:  Acute abdominal pain EXAM: CT ABDOMEN AND PELVIS WITH CONTRAST TECHNIQUE: Multidetector CT imaging of the abdomen and pelvis was performed using the standard protocol following bolus administration of intravenous contrast. RADIATION DOSE REDUCTION: This exam was performed according to the departmental dose-optimization program which includes automated exposure control, adjustment of the mA and/or kV according to patient size and/or use of iterative reconstruction technique. CONTRAST:  121m  OMNIPAQUE IOHEXOL 300 MG/ML  SOLN COMPARISON:  CT 11/04/2012.  Ultrasound earlier 07/01/2022 and older FINDINGS: Lower chest: Breathing motion at the lung bases. Mild patchy ground-glass identified with some tiny nodules, unchanged from the previous examination demonstrating long-term stability and no specific additional imaging follow-up. No pleural effusion. Coronary artery calcifications are seen. Please correlate for other coronary risk factors. Hepatobiliary: No space-occupying liver lesion. The gallbladder is nondilated. Patent portal vein. Pancreas: Mild pancreatic atrophy. No enhancing pancreatic mass or duct dilatation. Spleen: Spleen is nonenlarged. Adrenals/Urinary Tract: Adrenal glands are preserved. No renal collecting system dilatation. No true enhancing renal mass. There are several bilateral simple appearing renal cysts. Largest on the right is along the midportion of the right kidney inferiorly and posteriorly partially exophytic measuring up to 4.3 by 3.0 cm. Hounsfield unit of 7. five other smaller foci are identified as well. These are nonaggressive in most consistent with Bosniak 1 lesions. Left kidney also has at least 5 or 6 foci of varying size. The largest is seen posteriorly measuring up to 4.4 cm in diameter and has Hounsfield unit of 5. Again Bosniak 1 lesions. No specific imaging follow-up recommended. The ureters are normal course and caliber down to the bladder. Preserved contours of the urinary bladder. Stomach/Bowel: On this non oral contrast exam the large bowel is of normal course and caliber with some luminal debris and fluid. Few left-sided colonic diverticula. Normal retrocecal appendix. There is some moderate debris and fluid in the nondilated stomach. Small bowel is nondilated. Vascular/Lymphatic: Scattered vascular calcifications are identified. There is some ectasia of the infrarenal abdominal aorta with diameter approaching 3.5 by 3.0 cm. Atherosclerotic plaque as well  along the other branch vessels including the iliac vessels, greatest along the internal iliac. Is also potential high-grade stenosis seen along the bilateral common femoral arteries. Please correlate with specific symptoms. Preserved IVC. There is a retroaortic left renal vein. No specific abnormal lymph node enlargement present in the abdomen and pelvis. Reproductive: Status post hysterectomy. No adnexal masses. Other: Mild rectus muscle diastasis. No specific anterior pelvic wall significant hernia. Only minimal fat along the umbilicus. No ascites. Musculoskeletal: Curvature seen of the spine with scattered degenerative changes along the spine and pelvis. Areas of stenosis along the canal at the lower lumbar spine from L3 through  S1. Please correlate with specific symptoms. IMPRESSION: No bowel obstruction, free air or free fluid. Moderate debris in the stomach. Normal appendix. Few colonic diverticula. Numerous bilateral Bosniak 1 renal cysts. No specific imaging follow-up recommended. Global mild wall thickening of the urinary bladder. Please correlate for any known history including for cystitis. Scattered vascular calcifications including along the coronary arteries. Please correlate for other coronary risk factors. There is also significant calcified plaque along branch vessels with potential significant stenosis along the common femoral arteries bilaterally. Surgical changes along the area of the common femoral regions as well. 3.5 cm infrarenal abdominal aortic aneurysm Recommend follow-up ultrasound every 2 years. This recommendation follows ACR consensus guidelines: White Paper of the ACR Incidental Findings Committee II on Vascular Findings. J Am Coll Radiol 2013; 10:789-794. Advanced degenerative changes along the lower lumbar spine with multilevel stenosis. Electronically Signed   By: Jill Side M.D.   On: 07/01/2022 18:22   US Abdomen Limited RUQ (LIVER/GB)  Result Date: 07/01/2022 CLINICAL  DATA:  Right upper quadrant pain EXAM: ULTRASOUND ABDOMEN LIMITED RIGHT UPPER QUADRANT COMPARISON:  Ultrasound 07/26/2020 and older.  CT 2014 May FINDINGS: Gallbladder: No gallstones or wall thickening visualized. No sonographic Murphy sign noted by sonographer. Common bile duct: Diameter: 4 mm Liver: Diffusely echogenic hepatic parenchyma consistent with fatty liver infiltration. With this level of echogenicity evaluation for underlying mass lesion is limited and if needed follow-up contrast CT or MRI as clinically directed portal vein is patent on color Doppler imaging with normal direction of blood flow towards the liver. Other: None. IMPRESSION: No gallstones. Fatty liver.  No gallstones or ductal dilatation. Electronically Signed   By: Jill Side M.D.   On: 07/01/2022 16:01    Procedures Procedures    Medications Ordered in ED Medications  sodium chloride 0.9 % bolus 1,000 mL (1,000 mLs Intravenous New Bag/Given 07/01/22 1554)  ondansetron (ZOFRAN) injection 4 mg (4 mg Intravenous Given 07/01/22 1553)  iohexol (OMNIPAQUE) 300 MG/ML solution 100 mL (100 mLs Intravenous Contrast Given 07/01/22 1725)    ED Course/ Medical Decision Making/ A&P                             Medical Decision Making Amount and/or Complexity of Data Reviewed Labs: ordered. Radiology: ordered.  Risk Prescription drug management.  This patient is a 62 y.o. female  Pam presents to the ED for concern of abdominal pain and vomiting.   Differential diagnoses prior to evaluation: The emergent differential diagnosis includes, but is not limited to,  AAA, mesenteric ischemia, appendicitis, diverticulitis, DKA, gastroenteritis, nephrolithiasis, pancreatitis, constipation, UTI, bowel obstruction, biliary Cox, IBD, PUD, hepatitis. This is not an exhaustive differential.   Past Medical History / Co-morbidities: Cox, [name], [name], [name], [name], [name], Pam vascular  Cox, [name], [name]  Additional history: Chart reviewed. Pertinent results include: Communication with primary doctor reviewed from today in which patient was complaining about abdominal pain and vomiting.  They were scheduling her for labs and an outpatient ultrasound, but at that time patient said that her pain was getting so severe she was going to go to the ER.  Physical Exam: Physical exam performed. The pertinent findings include: Normal vital signs.  Abdomen soft with generalized tenderness, worse in the upper quadrants.  Lab Tests/Imaging studies: I personally interpreted labs/imaging and the pertinent results include: Leukocytosis of 15.1, suspect acute phase reactant after significant vomiting.  Stable hemoglobin.  CMP grossly  unremarkable.  Normal lipase.  Urinalysis negative for hematuria or infection.    Right upper quadrant ultrasound without acute findings.  CT abdomen pelvis with no acute findings -- stable infrarenal abdominal aortic aneurysm, renal cysts, bladder wall thickening. I agree with the radiologist interpretation.  Cardiac monitoring: EKG obtained and interpreted by my attending physician which shows: sinus rhythm   Medications: I ordered medication including IV fluids and zofran.  I have reviewed the patients home medicines and have made adjustments as needed.   Disposition: After consideration of the diagnostic results and the patients response to treatment, I feel that Cox department workup does not suggest an emergent condition requiring admission or immediate intervention beyond what has been performed at this time. The plan is: discharge to home with antiemetics for likely acute on chronic vomiting and abdominal bloating. No acute etiology found on patient's workup today. She has follow up scheduled with gastroenterology next month. Hopefully we can help control her symptoms until then. New medication changes (Mounjaro) could be impacting as well.  No evidence of medication-induced pancreatitis, cholecystitis.  The patient is safe for discharge and has been instructed to return immediately for worsening symptoms, change in symptoms or any other concerns.  Final Clinical Impression(s) / ED Diagnoses Final diagnoses:  Abdominal bloating  Nausea and vomiting, unspecified vomiting type    Rx / DC Orders ED Discharge Orders          Ordered    metoCLOPramide (REGLAN) 10 MG tablet  Every 6 hours        07/01/22 1851           Portions of this report may have been transcribed using voice recognition software. Every effort was made to ensure accuracy; however, inadvertent computerized transcription errors may be present.    Estill Cotta 07/01/22 1853    Godfrey Pick, MD 07/06/22 [phone]

## 2022-07-01 NOTE — Telephone Encounter (Signed)
Please see the MyChart message reply(ies) for my assessment and plan.   R/o acute pancreatitis/ cholecystitis.  Suspect adverse reaction to [facility].  Orders Placed This Encounter  Procedures   US Abdomen Complete    Standing Status:   Future    Standing Expiration Date:   07/02/2023    Order Specific Question:   Reason for Exam (SYMPTOM  OR DIAGNOSIS REQUIRED)    Answer:   please eval gallbladder AND pancreas    Order Specific Question:   Preferred imaging location?    Answer:   Internal   CBC    Standing Status:   Future    Standing Expiration Date:   07/02/2023   CMP14+EGFR    Standing Status:   Future    Standing Expiration Date:   07/02/2023   Lipase    Standing Status:   Future    Standing Expiration Date:   07/02/2023      This patient gave consent for this Medical Advice Message and is aware that it may result in a bill to [employer], as well as the possibility of receiving a bill for a co-payment or deductible. They are an established patient, but are not seeking medical advice exclusively about a problem treated during an in person or video visit in the last seven days. I did not recommend an in person or video visit within seven days of my reply.    I spent a total of 16 minutes cumulative time within 7 days through [employer].  Ronnie Doss, DO

## 2022-07-02 ENCOUNTER — Telehealth (INDEPENDENT_AMBULATORY_CARE_PROVIDER_SITE_OTHER): Payer: Medicare HMO | Admitting: Family Medicine

## 2022-07-02 DIAGNOSIS — R1084 Generalized abdominal pain: Secondary | ICD-10-CM

## 2022-07-02 DIAGNOSIS — E669 Obesity, unspecified: Secondary | ICD-10-CM | POA: Diagnosis not present

## 2022-07-02 DIAGNOSIS — R112 Nausea with vomiting, unspecified: Secondary | ICD-10-CM | POA: Diagnosis not present

## 2022-07-02 DIAGNOSIS — E1169 Type 2 diabetes mellitus with other specified complication: Secondary | ICD-10-CM | POA: Diagnosis not present

## 2022-07-02 NOTE — Progress Notes (Signed)
Virtual Visit  Note Due to COVID-19 pandemic this visit was conducted virtually. This visit type was conducted due to national recommendations for restrictions regarding the COVID-19 Pandemic (e.g. social distancing, sheltering in place) in an effort to limit this patient's exposure and mitigate transmission in our community. All issues noted in this document were discussed and addressed.  A physical exam was not performed with this format.  I connected with [name] on 07/02/22 at 1517 by telephone and verified that I am speaking with the correct person using two identifiers. [name] is currently located at home and no one is currently with her during the visit. The provider, Gwenlyn Perking, FNP is located in their office at time of visit.  I discussed the limitations, risks, security and privacy concerns of performing an evaluation and management service by telephone and the availability of in person appointments. I also discussed with the patient that there may be a patient responsible charge related to this service. The patient expressed understanding and agreed to proceed.  Unable to connect via video. Visit was telephone audio only.   CC: vomiting  History and Present Illness:  HPI [name] reports abdominal pain, nausea, vomiting, and diarrhea for 6 days. Symptoms have been gradually improving. She has not had any vomiting today and only 3 episodes of diarrhea today. These symtpoms started shortly after her first Mounjaro injection. She has had similar reactions in the past with ozempic, rybelsus, and metformin. She was evaluated in the ER yesterday due to the severity of her symptoms. Work up in the ER was unremarkable. WBC was 15.1- likely reactive to vomiting. Normal lipase and negative UA. CT abdomen/pelvis was negative for acute findings. EKG was NSR. She has now discontinue Mounjaro. She does report that her sugars have been well controlled with Mounjaro however. She  currently is taking tresiba 51 units at night. She does not want to increase her tresiba as her blood sugars are elevated overnight and she believes that tresiba is the cause of this. She has a follow up appt with Almyra Free next week.    ROS As per HPI.   Observations/Objective: Alert and oriented x 3. Able to speak in full sentences without difficulty.   Assessment and Plan: [name] was seen today for emesis.  Diagnoses and all orders for this visit:  Generalized abdominal pain Nausea and vomiting, unspecified vomiting type Due to [facility]. I have added Mounjaro to her allergy list. Symptoms are gradually improving and she has a unremarkable work up yesterday in the ER.   Diabetes mellitus type 2 in obese [facility]) Patient declined increasing tresiba today. She is interested in starting meal time insulin. Discussed will route this note to Almyra Free for her review and recommendations.    Follow Up Instructions: Return to office for new or worsening symptoms, or if symptoms persist.     I discussed the assessment and treatment plan with the patient. The patient was provided an opportunity to ask questions and all were answered. The patient agreed with the plan and demonstrated an understanding of the instructions.   The patient was advised to call back or seek an in-person evaluation if the symptoms worsen or if the condition fails to improve as anticipated.  The above assessment and management plan was discussed with the patient. The patient verbalized understanding of and has agreed to the management plan. Patient is aware to call the clinic if symptoms persist or worsen. Patient is aware when to return to  the clinic for a follow-up visit. Patient educated on when it is appropriate to go to the emergency department.   Time call ended:  1528  I provided 11 minutes of  non face-to-face time during this encounter.    Gwenlyn Perking, FNP

## 2022-07-04 ENCOUNTER — Telehealth: Payer: Self-pay | Admitting: Pharmacist

## 2022-07-04 ENCOUNTER — Encounter: Payer: Self-pay | Admitting: Family Medicine

## 2022-07-04 NOTE — Telephone Encounter (Signed)
Patient's blood sugar is <250 She has not started meal time insulin She was instructed to call our office next week and I will work her in to review meal time insulin

## 2022-07-16 ENCOUNTER — Ambulatory Visit (INDEPENDENT_AMBULATORY_CARE_PROVIDER_SITE_OTHER): Payer: Medicare HMO | Admitting: Pharmacist

## 2022-07-16 ENCOUNTER — Telehealth: Payer: Self-pay | Admitting: Pharmacist

## 2022-07-16 DIAGNOSIS — E1169 Type 2 diabetes mellitus with other specified complication: Secondary | ICD-10-CM

## 2022-07-16 DIAGNOSIS — E669 Obesity, unspecified: Secondary | ICD-10-CM

## 2022-07-16 DIAGNOSIS — E785 Hyperlipidemia, unspecified: Secondary | ICD-10-CM

## 2022-07-16 DIAGNOSIS — Z794 Long term (current) use of insulin: Secondary | ICD-10-CM | POA: Diagnosis not present

## 2022-07-16 DIAGNOSIS — G720 Drug-induced myopathy: Secondary | ICD-10-CM

## 2022-07-16 DIAGNOSIS — J418 Mixed simple and mucopurulent chronic bronchitis: Secondary | ICD-10-CM

## 2022-07-16 MED ORDER — REPATHA SURECLICK 140 MG/ML ~~LOC~~ SOAJ: 140.0000 mg | SUBCUTANEOUS | 5 refills | Status: DC

## 2022-07-16 MED ORDER — BREZTRI AEROSPHERE 160-9-4.8 MCG/ACT IN AERO: 2.0000 | INHALATION_SPRAY | Freq: Two times a day (BID) | RESPIRATORY_TRACT | 11 refills | Status: DC

## 2022-07-16 NOTE — Progress Notes (Signed)
07/16/2022 Name: [name]           MRN: [id number]       DOB: 04/19/1961     S:  62 yoF Presents for diabetes evaluation, education, and management She has multiple intolerances to medications.  She is interested in alternative therapies to manage her chronic conditions   Insurance coverage/medication affordability: [employer] medicare   Patient denies adherence with medications due to intolerances. Current diabetes medications include: tresiba only Current hypertension medications include: losartan Goal 130/80 Current hyperlipidemia medications include: on zetia  allergy to atorvastatin, pravastatin, rosuvastatin STATIN ALLERGY/INTOLERANCE DOCUMENTED IN EMR g72     Patient denies hypoglycemic events.   Patient reported dietary habits: Eats 1 meals/day     O:   Recent Labs       Lab Results  Component Value Date    HGBA1C 8.8 (H) 06/09/2022        Lipid Panel   Labs (Brief)          Component Value Date/Time    CHOL 206 (H) 06/09/2022 1433    CHOL 136 12/21/2012 1707    TRIG 348 (H) 06/09/2022 1433    TRIG 192 (H) 04/21/2013 1512    TRIG 164 (H) 12/21/2012 1707    HDL 34 (L) 06/09/2022 1433    HDL 32 (L) 04/21/2013 1512    HDL 27 (L) 12/21/2012 1707    CHOLHDL 6.1 (H) 06/09/2022 1433    LDLCALC 112 (H) 06/09/2022 1433    LDLCALC 104 (H) 04/21/2013 1512    LDLCALC 76 12/21/2012 1707        Home fasting blood sugars: 200   2 hour post-meal/random blood sugars: up to      Clinical Atherosclerotic Cardiovascular Disease (ASCVD): No    The 10-year ASCVD risk score (Arnett DK, et al., 2019) is: 24.6%   Values used to calculate the score:     Age: 62 years     Sex: Female     Is Non-Hispanic African American: No     Diabetic: Yes     Tobacco smoker: Yes     Systolic Blood Pressure: [id number] mmHg     Is BP treated: Yes     HDL Cholesterol: 34 mg/dL     Total Cholesterol: 206 mg/dL     A/P:   Diabetes T2DM currently uncontrolled. Patient is  unable to take mounjao, rybelsus, ozempic, januvia and has now stopped metformin.  Beneficial option that remain are meal time insulin.  Patient has declined meal time insulin at this time, but we will revisit. She remains on glimepiride which likely has very little benefit at this point. Also, would not recommend SGLT2 due to high blood sugars and past AKA   -Continue tresiba insulin to 60-65 units nightly             PAP forms sent to patient for insulin  Patient states Bgs are better controlled with diet improvements   -repatha started for PCSK9  Need to apply for healthwell grant--awaiting information   -additional breztri samples given  Will apply for patient assistance  -Extensively discussed pathophysiology of diabetes, recommended lifestyle interventions, dietary effects on blood sugar control   -Counseled on s/sx of and management of hypoglycemia   -Next A1C anticipated next PCP visit.    Written patient instructions provided.  Total time in face to face counseling 25 minutes.      Regina Eck, PharmD, BCPS, BCACP Clinical Pharmacist,  [place]  [facility]  T [phone]

## 2022-07-16 NOTE — Telephone Encounter (Signed)
Please send full PAP app to my email for Breztri inhaler [place]&me Tolerated breztri sample with no issues Will escribe to [facility] Thank you!

## 2022-07-17 ENCOUNTER — Telehealth: Payer: Self-pay | Admitting: Pharmacist

## 2022-07-17 NOTE — Telephone Encounter (Signed)
Received notification from [place] regarding approval for TRESIBA & NOVOLOG. Patient assistance approved from 07/09/22 to 07/03/23.  MEDICATION WILL SHIP TO OFFICE  Phone: [phone]

## 2022-07-20 ENCOUNTER — Encounter: Payer: Medicare HMO | Admitting: Gastroenterology

## 2022-07-20 ENCOUNTER — Other Ambulatory Visit (HOSPITAL_COMMUNITY): Payer: Self-pay

## 2022-07-20 NOTE — Telephone Encounter (Signed)
Pharmacy aware

## 2022-07-21 ENCOUNTER — Telehealth: Payer: Self-pay

## 2022-07-21 ENCOUNTER — Other Ambulatory Visit (HOSPITAL_COMMUNITY): Payer: Self-pay

## 2022-07-21 NOTE — Telephone Encounter (Signed)
Patient informed that [place] and Novolog have been received and placed in the refrigerator for pick up.

## 2022-07-22 ENCOUNTER — Encounter: Payer: Self-pay | Admitting: Gastroenterology

## 2022-07-22 ENCOUNTER — Telehealth: Payer: Self-pay

## 2022-07-22 ENCOUNTER — Encounter: Payer: Self-pay | Admitting: Family Medicine

## 2022-07-22 ENCOUNTER — Ambulatory Visit: Payer: Medicare HMO | Admitting: Gastroenterology

## 2022-07-22 VITALS — HR 85 | Ht 70.0 in | Wt 229.0 lb

## 2022-07-22 DIAGNOSIS — Z7902 Long term (current) use of antithrombotics/antiplatelets: Secondary | ICD-10-CM

## 2022-07-22 DIAGNOSIS — Z1211 Encounter for screening for malignant neoplasm of colon: Secondary | ICD-10-CM

## 2022-07-22 DIAGNOSIS — Z800 Family history of malignant neoplasm of digestive organs: Secondary | ICD-10-CM

## 2022-07-22 MED ORDER — NA SULFATE-K SULFATE-MG SULF 17.5-3.13-1.6 GM/177ML PO SOLN: 1.0000 | Freq: Once | ORAL | 0 refills | Status: AC

## 2022-07-22 NOTE — Telephone Encounter (Signed)
Dr Lajuana Ripple  I am sending this this clearance to you because you prescribe Plavix for this patient.  Are you willing to clear this patient to hold her Plavix 5 day prior to her procedure which is scheduled for 08-11-2022? Please respond as soon as you can. Take you for your time  [facility] Pre-operative Risk Assessment     Request for surgical clearance:     Endoscopy Procedure  What type of surgery is being performed?     Colonoscopy   When is this surgery scheduled?     08-11-2022  What type of clearance is required ?   Pharmacy  Are there any medications that need to be held prior to surgery and how long? Plavix 5 days prior   Practice name and name of physician performing surgery?      [facility]  What is your office phone and fax number?      Phone- [phone]  [phone]  Anesthesia type (None, local, MAC, general) ?       MAC

## 2022-07-22 NOTE — Progress Notes (Signed)
[facility] Consult Note:  History: [name] 07/22/2022  Referring provider: Janora Norlander, DO  Reason for consult/chief complaint: Colonoscopy (Pt state want to discuss a colon. Pt is currently still taking plavix)   Subjective  HPI: [name] was originally referred to [place] last month for consideration of screening colonoscopy.  She subsequently developed acute onset digestive symptoms. Patient was seen in the ED 07/01/22 for abdominal pain/bloating with accompanying N/V/D x4 days. She had administered Mounjaro x2 days prior to symptom onset. She had similar symptoms in the past with Ozempic. She had a RUQ abdominal US on 07/01/22 which showed fatty liver but was otherwise unremarkable. She had a CT AP w contrast on 07/01/22 which revealed no bowel obstruction, free air or free fluid. Moderate debris in the stomach. Normal appendix. Few colonic diverticula.  January 2014, patient had procedures with Dr. Sharlett Iles.  EGD revealed hiatal hernia, CLO test biopsies taken to rule out H. pylori.  Colonoscopy complete exam with good prep, subcentimeter hepatic flexure polyp removed, tissue not retrieved (therefore pathology unknown).  She is on Plavix for PAD. She has a h/o of right BKA. She had 6 arterial bypass surgeries on her RLE prior to needing her amputation. She denies any other amputation history. Her Plavix was started by vasc surg Dr. Sherren Mocha Early and is now managed by her PCP Dr. Ronnie Doss.  She reports occasional anal itching and discomfort associated with hemorrhoids. She has intermittent bright red rectal bleeding when she is constipated and passes bowel movements. She has twisting pain in her upper abdomen intermittently. This occurs once a month or so. She denies any bowel changes associated with these episodes of pain.  She is no longer on any GLP-1 agonists. Her only DM medications at this time are Glimepiride and Tresiba at nighttime. She  started the [place] x3 months ago.  Her brother had colon cancer and passed away from stomach cancer in his 4s.  She is a smoker.  ROS: Review of Systems  Constitutional:  Negative for appetite change and fever.  HENT:  Negative for trouble swallowing.   Respiratory:  Negative for cough and shortness of breath.   Cardiovascular:  Negative for chest pain.  Gastrointestinal:  Positive for abdominal distention, anal bleeding (hemorrhoids), constipation, diarrhea, nausea and rectal pain. Negative for abdominal pain, blood in stool and vomiting.       + acid reflux + belching + flatulance  Genitourinary:  Negative for dysuria.  Musculoskeletal:  Negative for back pain.  Skin:  Negative for rash.  Neurological:  Negative for weakness.  All other systems reviewed and are negative.    Past Medical History: Past Medical History:  Diagnosis Date   Abscess 01/07/2021   Acute blood loss anemia    Arthritis    COPD (chronic obstructive pulmonary disease) (HCC)    Deep vein thrombosis (DVT) of both lower extremities ([place]) 01/05/2017   Depression    DM (diabetes mellitus) ([place])    DVT (deep venous thrombosis) (HCC)    x5   Family history of colon cancer    GERD (gastroesophageal reflux disease)    Hyperlipemia    Hypertension    Hypothyroid    Mild protein-calorie malnutrition (HCC)    Obesity    Osteomyelitis of great toe of right foot ([place]) 07/04/2018   PVD (peripheral vascular disease) (HCC)    S/P unilateral BKA (below knee amputation), right (HCC)    Type 2 diabetes mellitus with diabetic peripheral angiopathy  and gangrene, without long-term current use of insulin ([place]) 12/27/2018   Unilateral complete BKA, right, initial encounter ([place]) 07/11/2018   Unilateral complete BKA, right, subsequent encounter ([place])    Uterine cancer ([place])    UTI (urinary tract infection)    Wound dehiscence 01/24/2021     Past Surgical History: Past Surgical History:  Procedure Laterality Date    ABDOMINAL HYSTERECTOMY     AMPUTATION Right 07/08/2018   Procedure: RIGHT BELOW KNEE AMPUTATION;  Surgeon: [name], MD;  Location: [facility];  Service: Orthopedics;  Laterality: Right;   CESAREAN SECTION     FEMORAL BYPASS     x 5   LUMBAR DISC SURGERY     L4-L5   STUMP REVISION Right 01/24/2021   Procedure: right below the knee amputation revision;  Surgeon: [name], MD;  Location: [facility];  Service: Orthopedics;  Laterality: Right;   TOE AMPUTATION     right   TONSILLECTOMY       Family History: Family History  Problem Relation Age of Onset   Hypertension Mother    Hypothyroidism Mother    Heart disease Father    Diabetes Father    Hyperlipidemia Father    Colon cancer Brother 4   Clotting disorder Brother    Diabetes Brother    Heart disease Brother    Liver disease Neg Hx    Esophageal cancer Neg Hx     Social History: Social History   Socioeconomic History   Marital status: Divorced    Spouse name: Danny   Number of children: 1   Years of education: 16   Highest education level: Conservator, museum/gallery (e.g., MA, MS, MEng, MEd, MSW, MBA)  Occupational History   Occupation: Product manager: Art gallery manager   Occupation: retired  Tobacco Use   Smoking status: Every Day    Packs/day: 1.00    Years: 38.00    Total pack years: 38.00    Types: Cigarettes   Smokeless tobacco: Never  Vaping Use   Vaping Use: Never used  Substance and Sexual Activity   Alcohol use: No    Alcohol/week: 0.0 standard drinks of alcohol   Drug use: Yes    Frequency: 4.0 times per week    Types: Marijuana   Sexual activity: Not Currently    Birth control/protection: Surgical  Other Topics Concern   Not on file  Social History Narrative   Lives alone in one bedroom apartment - has a ramp outside - no stairs -handicap accessible bathroom   R BKA, but has prosthetic leg she uses to walk, drive, etc   Lots of family nearby, great relationships - lots of help when needed    Social Determinants of Health   Financial Resource Strain: Low Risk  (06/17/2022)   Overall Financial Resource Strain (CARDIA)    Difficulty of Paying Living Expenses: Not hard at all  Food Insecurity: Food Insecurity Present (06/17/2022)   Hunger Vital Sign    Worried About Running Out of Food in the Last Year: Sometimes true    Ran Out of Food in the Last Year: Never true  Transportation Needs: No Transportation Needs (06/17/2022)   PRAPARE - Hydrologist (Medical): No    Lack of Transportation (Non-Medical): No  Physical Activity: Inactive (06/17/2022)   Exercise Vital Sign    Days of Exercise per Week: 0 days    Minutes of Exercise per Session: 0 min  Stress: No  Stress Concern Present (06/17/2022)   [place]    Feeling of Stress : Not at all  Social Connections: Moderately Isolated (06/17/2022)   Social Connection and Isolation Panel [NHANES]    Frequency of Communication with Friends and Family: More than three times a week    Frequency of Social Gatherings with Friends and Family: More than three times a week    Attends Religious Services: More than 4 times per year    Active Member of [employer] or Organizations: No    Attends Archivist Meetings: Never    Marital Status: Divorced    Allergies: Allergies  Allergen Reactions   Aleve [Naproxen Sodium] Hives and Itching   Cortisone Swelling    Internal organs   Mounjaro [Tirzepatide]     Vomiting, severe abdominal pain, diarrhea   Ozempic (0.25 Or 0.5 Mg-Dose) [Semaglutide(0.25 Or 0.63m-Dos)] Diarrhea and Nausea Only   Prednisone Swelling    Makes internal organs swell   Statins Other (See Comments)    Myalgia even with Pravastatin   Vancomycin Itching   Janumet [Sitagliptin-Metformin Hcl]     Nausea/diarrhea    Semaglutide Diarrhea and Nausea And Vomiting    RYBELSUS --PATIENT CAN'T TOLERATE 3MG DAILY     Outpatient Meds: Current Outpatient Medications  Medication Sig Dispense Refill   Accu-Chek Softclix Lancets lancets Use as instructed to test BGs up to 3 times daily E11.52 300 each 3   ascorbic acid (VITAMIN C) 500 MG tablet Take 500 mg by mouth daily.     aspirin EC 81 MG EC tablet Take 1 tablet (81 mg total) by mouth daily.     Budeson-Glycopyrrol-Formoterol (BREZTRI AEROSPHERE) 160-9-4.8 MCG/ACT AERO Inhale 2 puffs into the lungs 2 (two) times daily. 32.1 g 11   Cholecalciferol (VITAMIN D3) 5000 units TABS Take 5,000 Units by mouth daily.     clopidogrel (PLAVIX) 75 MG tablet Take 1 tablet (75 mg total) by mouth daily. 90 tablet 3   diclofenac Sodium (VOLTAREN) 1 % GEL APPLY 2 GRAMS UP TO 4 TIMES A DAY AS NEEDED 300 g 1   escitalopram (LEXAPRO) 10 MG tablet Take 1 tablet (10 mg total) by mouth daily. 90 tablet 3   Evolocumab (REPATHA SURECLICK) 1XX123456MG/ML SOAJ Inject 140 mg into the skin every 14 (fourteen) days. 2 mL 5   ezetimibe (ZETIA) 10 MG tablet Take 1 tablet (10 mg total) by mouth daily. 90 tablet 3   furosemide (LASIX) 20 MG tablet TAKE 1 TABLET DAILY 90 tablet 0   glimepiride (AMARYL) 4 MG tablet Take 1 tablet (4 mg total) by mouth daily before breakfast. 30 tablet 3   glucose blood (ACCU-CHEK GUIDE) test strip Check BS up to 3 times daily Dx E11.52 300 strip 3   insulin degludec (TRESIBA FLEXTOUCH) 100 UNIT/ML FlexTouch Pen Inject 40-60 Units into the skin at bedtime. 54 mL 3   Insulin Pen Needle (NOVOFINE PLUS PEN NEEDLE) 32G X 4 MM MISC UAD with tresiba E11.9 100 each 3   levothyroxine (SYNTHROID) 100 MCG tablet TAKE ONE TABLET ONCE DAILY BEFORE BREAKFAST 90 tablet 1   losartan (COZAAR) 50 MG tablet Take 1 tablet (50 mg total) by mouth daily. 90 tablet 3   pantoprazole (PROTONIX) 40 MG tablet Take 1 tablet (40 mg total) by mouth daily. 90 tablet 3   Potassium 99 MG TABS Take 99 mg by mouth in the morning.     sennosides-docusate sodium (SENOKOT-S) 8.6-50 MG  tablet Take 1  tablet by mouth in the morning.     tretinoin (RETIN-A) 0.05 % cream APPLY TO AFFECTED AREAS AT BEDTIME 45 g 3   No current facility-administered medications for this visit.      ___________________________________________________________________ Objective   Exam:  BP 130/80   Pulse 85   Ht 5' 10"$  (1.778 m)   Wt 229 lb (103.9 kg)   SpO2 93%   BMI 32.86 kg/m  Wt Readings from Last 3 Encounters:  07/22/22 229 lb (103.9 kg)  07/01/22 227 lb (103 kg)  06/17/22 227 lb (103 kg)    General: well-appearing   Eyes: sclera anicteric, no redness ENT: oral mucosa moist without lesions, no cervical or supraclavicular lymphadenopathy CV: RRR, no JVD, no peripheral edema Resp: clear to auscultation bilaterally, normal RR and effort noted GI: soft, no tenderness, with active bowel sounds. No guarding or palpable organomegaly noted. Skin; warm and dry, no rash or jaundice noted Extremities: Right BKA Neuro: awake, alert and oriented x 3. Normal gross motor function and fluent speech  Labs:     Latest Ref Rng & Units 07/01/2022    3:03 PM 10/06/2021    2:20 PM 01/24/2021   10:30 AM  CMP  Glucose 70 - 99 mg/dL 136  104  161   BUN 6 - 20 mg/dL 15  10  17   $ Creatinine 0.44 - 1.00 mg/dL 0.84  0.75  0.78   Sodium 135 - 145 mmol/L 135  140  137   Potassium 3.5 - 5.1 mmol/L 3.7  4.4  4.5   Chloride 98 - 111 mmol/L 102  100  103   CO2 22 - 32 mmol/L 25  22  25   $ Calcium 8.9 - 10.3 mg/dL 9.0  10.3  8.9   Total Protein 6.5 - 8.1 g/dL 7.7  7.4    Total Bilirubin 0.3 - 1.2 mg/dL 0.5  <0.2    Alkaline Phos 38 - 126 U/L 54  60    AST 15 - 41 U/L 21  31    ALT 0 - 44 U/L 15  26        Latest Ref Rng & Units 07/01/2022    3:03 PM 10/06/2021    2:20 PM 01/24/2021   10:30 AM  CBC  WBC 4.0 - 10.5 K/uL 15.1  14.1  15.0   Hemoglobin 12.0 - 15.0 g/dL 13.3  13.0  15.1   Hematocrit 36.0 - 46.0 % 42.0  41.3  46.2   Platelets 150 - 400 K/uL 294  372  263       Component Value Date/Time   LIPASE 36  07/01/2022 1503      Component Value Date/Time   COLORURINE YELLOW 07/01/2022 1600   APPEARANCEUR CLEAR 07/01/2022 1600   APPEARANCEUR Clear 08/16/2019 1149   LABSPEC 1.014 07/01/2022 1600   PHURINE 6.0 07/01/2022 1600   GLUCOSEU NEGATIVE 07/01/2022 1600   HGBUR NEGATIVE 07/01/2022 1600   BILIRUBINUR NEGATIVE 07/01/2022 1600   BILIRUBINUR Negative 08/16/2019 1149   KETONESUR 5 (A) 07/01/2022 1600   PROTEINUR NEGATIVE 07/01/2022 1600   UROBILINOGEN negative 04/24/2015 1454   UROBILINOGEN 0.2 12/08/2006 0927   NITRITE NEGATIVE 07/01/2022 1600   LEUKOCYTESUR NEGATIVE 07/01/2022 1600      Radiologic Studies:  CT ABDOMEN PELVIS W CONTRAST Result Date: 07/01/2022 CLINICAL DATA:  Acute abdominal pain EXAM: CT ABDOMEN AND PELVIS WITH CONTRAST TECHNIQUE: Multidetector CT imaging of the abdomen and pelvis was performed using the standard  protocol following bolus administration of intravenous contrast. RADIATION DOSE REDUCTION: This exam was performed according to the departmental dose-optimization program which includes automated exposure control, adjustment of the mA and/or kV according to patient size and/or use of iterative reconstruction technique. CONTRAST:  152m OMNIPAQUE IOHEXOL 300 MG/ML  SOLN COMPARISON:  CT 11/04/2012.  Ultrasound earlier 07/01/2022 and older FINDINGS: Lower chest: Breathing motion at the lung bases. Mild patchy ground-glass identified with some tiny nodules, unchanged from the previous examination demonstrating long-term stability and no specific additional imaging follow-up. No pleural effusion. Coronary artery calcifications are seen. Please correlate for other coronary risk factors. Hepatobiliary: No space-occupying liver lesion. The gallbladder is nondilated. Patent portal vein. Pancreas: Mild pancreatic atrophy. No enhancing pancreatic mass or duct dilatation. Spleen: Spleen is nonenlarged. Adrenals/Urinary Tract: Adrenal glands are preserved. No renal collecting  system dilatation. No true enhancing renal mass. There are several bilateral simple appearing renal cysts. Largest on the right is along the midportion of the right kidney inferiorly and posteriorly partially exophytic measuring up to 4.3 by 3.0 cm. Hounsfield unit of 7. five other smaller foci are identified as well. These are nonaggressive in most consistent with Bosniak 1 lesions. Left kidney also has at least 5 or 6 foci of varying size. The largest is seen posteriorly measuring up to 4.4 cm in diameter and has Hounsfield unit of 5. Again Bosniak 1 lesions. No specific imaging follow-up recommended. The ureters are normal course and caliber down to the bladder. Preserved contours of the urinary bladder. Stomach/Bowel: On this non oral contrast exam the large bowel is of normal course and caliber with some luminal debris and fluid. Few left-sided colonic diverticula. Normal retrocecal appendix. There is some moderate debris and fluid in the nondilated stomach. Small bowel is nondilated. Vascular/Lymphatic: Scattered vascular calcifications are identified. There is some ectasia of the infrarenal abdominal aorta with diameter approaching 3.5 by 3.0 cm. Atherosclerotic plaque as well along the other branch vessels including the iliac vessels, greatest along the internal iliac. Is also potential high-grade stenosis seen along the bilateral common femoral arteries. Please correlate with specific symptoms. Preserved IVC. There is a retroaortic left renal vein. No specific abnormal lymph node enlargement present in the abdomen and pelvis. Reproductive: Status post hysterectomy. No adnexal masses. Other: Mild rectus muscle diastasis. No specific anterior pelvic wall significant hernia. Only minimal fat along the umbilicus. No ascites. Musculoskeletal: Curvature seen of the spine with scattered degenerative changes along the spine and pelvis. Areas of stenosis along the canal at the lower lumbar spine from L3 through S1.  Please correlate with specific symptoms. IMPRESSION: No bowel obstruction, free air or free fluid. Moderate debris in the stomach. Normal appendix. Few colonic diverticula. Numerous bilateral Bosniak 1 renal cysts. No specific imaging follow-up recommended. Global mild wall thickening of the urinary bladder. Please correlate for any known history including for cystitis. Scattered vascular calcifications including along the coronary arteries. Please correlate for other coronary risk factors. There is also significant calcified plaque along branch vessels with potential significant stenosis along the common femoral arteries bilaterally. Surgical changes along the area of the common femoral regions as well. 3.5 cm infrarenal abdominal aortic aneurysm Recommend follow-up ultrasound every 2 years. This recommendation follows ACR consensus guidelines: White Paper of the ACR Incidental Findings Committee II on Vascular Findings. J Am Coll Radiol 2013; 10:789-794. Advanced degenerative changes along the lower lumbar spine with multilevel stenosis. Electronically Signed   By: AJill SideM.D.   On: 07/01/2022 18:22  US Abdomen Limited RUQ (LIVER/GB) Result Date: 07/01/2022 CLINICAL DATA:  Right upper quadrant pain EXAM: ULTRASOUND ABDOMEN LIMITED RIGHT UPPER QUADRANT COMPARISON:  Ultrasound 07/26/2020 and older.  CT 2014 May FINDINGS: Gallbladder: No gallstones or wall thickening visualized. No sonographic Murphy sign noted by sonographer. Common bile duct: Diameter: 4 mm Liver: Diffusely echogenic hepatic parenchyma consistent with fatty liver infiltration. With this level of echogenicity evaluation for underlying mass lesion is limited and if needed follow-up contrast CT or MRI as clinically directed portal vein is patent on color Doppler imaging with normal direction of blood flow towards the liver. Other: None. IMPRESSION: No gallstones. Fatty liver.  No gallstones or ductal dilatation. Electronically Signed   By:  Jill Side M.D.   On: 07/01/2022 16:01     Assessment: Family history of colon cancer  Long term (current) use of antithrombotics/antiplatelets   Due for screening colonoscopy due to increased risk with family history noted above.  Colonoscopy recommended, and she is on my schedule for March 5.  She was agreeable after discussion of procedure and risks.  The benefits and risks of the planned procedure were described in detail with the patient or (when appropriate) their health care proxy.  Risks were outlined as including, but not limited to, bleeding, infection, perforation, adverse medication reaction leading to cardiac or pulmonary decompensation, pancreatitis (if ERCP).  The limitation of incomplete mucosal visualization was also discussed.  No guarantees or warranties were given.  Hold Plavix 5 days before procedure with no interruption in daily aspirin.  We will clear this with her PCP who prescribes the medicine, but I suspect they will be agreeable since [name] has not had a vascular intervention since before her amputation.   Thank you for the courtesy of this consult.  Please call me with any questions or concerns.  Eugene Gavia  CC: Referring provider noted above   I,Alexis Herring,acting as a Education administrator for [place], MD.,have documented all relevant documentation on the behalf of Doran Stabler, MD,as directed by  Doran Stabler, MD while in the presence of Doran Stabler, MD.

## 2022-07-22 NOTE — Patient Instructions (Addendum)
_______________________________________________________  If your blood pressure at your visit was 140/90 or greater, please contact your primary care physician to follow up on this.  _______________________________________________________  If you are age 60 or older, your body mass index should be between 23-30. Your Body mass index is 32.86 kg/m. If this is out of the aforementioned range listed, please consider follow up with your Primary Care Provider.  If you are age 54 or younger, your body mass index should be between 19-25. Your Body mass index is 32.86 kg/m. If this is out of the aformentioned range listed, please consider follow up with your Primary Care Provider.   ________________________________________________________  The [facility] providers would like to encourage you to use [facility] to communicate with providers for non-urgent requests or questions.  Due to long hold times on the telephone, sending your provider a message by [facility] may be a faster and more efficient way to get a response.  Please allow 48 business hours for a response.  Please remember that this is for non-urgent requests.  _______________________________________________________  [name] have been scheduled for a colonoscopy. Please follow written instructions given to you at your visit today.  Please pick up your prep supplies at the pharmacy within the next 1-3 days. If you use inhalers (even only as needed), please bring them with you on the day of your procedure.  We have sent the following medications to your pharmacy for you to pick up at your convenience: [place] will be contacted by our office prior to your procedure for directions on holding your Plavix.  If you do not hear from our office 1 week prior to your scheduled procedure, please call [phone] to discuss.   It was a pleasure to see you today!  Thank you for trusting me with your gastrointestinal care!

## 2022-07-23 ENCOUNTER — Ambulatory Visit: Payer: Medicare HMO | Admitting: Physician Assistant

## 2022-07-24 NOTE — Telephone Encounter (Signed)
On this for PAD. Ok to hold for procedure.

## 2022-07-24 NOTE — Telephone Encounter (Signed)
LVM for patient to call back. ?

## 2022-07-27 NOTE — Telephone Encounter (Signed)
LVM again

## 2022-07-27 NOTE — Telephone Encounter (Signed)
LVM again for patient to call back 

## 2022-07-27 NOTE — Telephone Encounter (Signed)
Called again and ranged and went to voicemail

## 2022-07-28 NOTE — Telephone Encounter (Signed)
Called again and left voicemail

## 2022-07-28 NOTE — Telephone Encounter (Signed)
Patient made aware to hold blood thinner 5 days prior and voiced understanding

## 2022-07-31 NOTE — Telephone Encounter (Signed)
Submitted application for BREZTRI to [employer] for patient assistance.   Phone: [phone]

## 2022-08-04 ENCOUNTER — Encounter: Payer: Self-pay | Admitting: Gastroenterology

## 2022-08-07 NOTE — Telephone Encounter (Signed)
Received notification from [employer] regarding approval for [place]. Patient assistance approved from 08/04/22 to 06/08/23.  Phone: [phone]

## 2022-08-11 ENCOUNTER — Ambulatory Visit (AMBULATORY_SURGERY_CENTER): Payer: Medicare HMO | Admitting: Gastroenterology

## 2022-08-11 ENCOUNTER — Encounter: Payer: Self-pay | Admitting: Gastroenterology

## 2022-08-11 VITALS — BP 119/55 | HR 79 | Temp 97.8°F | Resp 17 | Ht 70.0 in | Wt 229.0 lb

## 2022-08-11 DIAGNOSIS — D123 Benign neoplasm of transverse colon: Secondary | ICD-10-CM

## 2022-08-11 DIAGNOSIS — Z86010 Personal history of colonic polyps: Secondary | ICD-10-CM

## 2022-08-11 DIAGNOSIS — Z09 Encounter for follow-up examination after completed treatment for conditions other than malignant neoplasm: Secondary | ICD-10-CM | POA: Diagnosis not present

## 2022-08-11 DIAGNOSIS — D124 Benign neoplasm of descending colon: Secondary | ICD-10-CM | POA: Diagnosis not present

## 2022-08-11 DIAGNOSIS — Z800 Family history of malignant neoplasm of digestive organs: Secondary | ICD-10-CM | POA: Diagnosis not present

## 2022-08-11 MED ORDER — SODIUM CHLORIDE 0.9 % IV SOLN: 500.0000 mL | Freq: Once | INTRAVENOUS | Status: DC

## 2022-08-11 NOTE — Op Note (Signed)
[place] Patient Name: [name] Procedure Date: 08/11/2022 2:25 PM MRN: [id number] Endoscopist: Mallie Mussel L. Loletha Carrow , MD, [id number] Age: 62 Referring MD:  Date of Birth: November 22, 1960 Gender: Female Account #: [id number] Procedure:                Colonoscopy Indications:              Surveillance: Personal history of colonic polyps                            (unknown histology) on last colonoscopy more than 5                            years ago, Screening in patient at increased risk:                            Colorectal cancer in brother 44 or older                           Polyp of unknown pathology last colonoscopy 2014                            (tissue not retrieved) Medicines:                Monitored Anesthesia Care Procedure:                Pre-Anesthesia Assessment:                           - Prior to the procedure, a History and Physical                            was performed, and patient medications and                            allergies were reviewed. The patient's tolerance of                            previous anesthesia was also reviewed. The risks                            and benefits of the procedure and the sedation                            options and risks were discussed with the patient.                            All questions were answered, and informed consent                            was obtained. Prior Anticoagulants: The patient has                            taken Plavix (clopidogrel), last dose was 5 days  prior to procedure. ASA Grade Assessment: III - A                            patient with severe systemic disease. After                            reviewing the risks and benefits, the patient was                            deemed in satisfactory condition to undergo the                            procedure.                           After obtaining informed consent, the colonoscope                             was passed under direct vision. Throughout the                            procedure, the patient's blood pressure, pulse, and                            oxygen saturations were monitored continuously. The                            CF [id number] was introduced through the anus                            and advanced to the the cecum, identified by                            appendiceal orifice and ileocecal valve. The                            colonoscopy was extremely difficult due to a                            redundant colon, significant looping and the                            patient's body habitus. Successful completion of                            the procedure was aided by changing the patient to                            a semi-prone position, using manual pressure and                            straightening and shortening the scope to obtain  bowel loop reduction. The patient tolerated the                            procedure well. The quality of the bowel                            preparation was good. The ileocecal valve,                            appendiceal orifice, and rectum were photographed.                            The bowel preparation used was SUPREP. Scope In: 2:35:53 PM Scope Out: 2:56:04 PM Scope Withdrawal Time: 0 hours 12 minutes 44 seconds  Total Procedure Duration: 0 hours 20 minutes 11 seconds  Findings:                 A nonthrombosed external hemorrhoid was found on                            perianal exam.                           The colon (entire examined portion) was                            significantly redundant.                           Repeat examination of right colon under NBI                            performed.                           An area of melanosis was found in the entire colon.                           Three sessile polyps were found in the descending                             colon, proximal transverse colon and distal                            transverse colon. The polyps were diminutive in                            size. These polyps were removed with a cold snare.                            Resection and retrieval were complete.                           The exam was otherwise without abnormality on  direct and retroflexion views. Complications:            No immediate complications. Estimated Blood Loss:     Estimated blood loss was minimal. Impression:               - Hemorrhoids found on perianal exam.                           - Redundant colon.                           - Melanosis in the colon.                           - Three diminutive polyps in the descending colon,                            in the proximal transverse colon and in the distal                            transverse colon, removed with a cold snare.                            Resected and retrieved.                           - The examination was otherwise normal on direct                            and retroflexion views. Recommendation:           - Patient has a contact number available for                            emergencies. The signs and symptoms of potential                            delayed complications were discussed with the                            patient. Return to normal activities tomorrow.                            Written discharge instructions were provided to the                            patient.                           - Resume previous diet.                           - Resume Plavix (clopidogrel) at prior dose                            tomorrow.                           -  Await pathology results.                           - Repeat colonoscopy in 5 years for polyp                            surveillance and family history of colon cancer. Redmond Whittley L. Loletha Carrow, MD 08/11/2022 3:03:24 PM This report has been signed  electronically.

## 2022-08-11 NOTE — Progress Notes (Signed)
Called to room to assist during endoscopic procedure.  Patient ID and intended procedure confirmed with present staff. Received instructions for my participation in the procedure from the performing physician.  

## 2022-08-11 NOTE — Patient Instructions (Signed)
RESUME Plavix at prior dose tomorrow.  Repeat colonoscopy in 5 years for polyps surveillance and family history of colon cancer.   YOU HAD AN ENDOSCOPIC PROCEDURE TODAY AT [facility]:   Refer to the procedure report that was given to you for any specific questions about what was found during the examination.  If the procedure report does not answer your questions, please call your gastroenterologist to clarify.  If you requested that your care partner not be given the details of your procedure findings, then the procedure report has been included in a sealed envelope for you to review at your convenience later.  YOU SHOULD EXPECT: Some feelings of bloating in the abdomen. Passage of more gas than usual.  Walking can help get rid of the air that was put into your GI tract during the procedure and reduce the bloating. If you had a lower endoscopy (such as a colonoscopy or flexible sigmoidoscopy) you may notice spotting of blood in your stool or on the toilet paper. If you underwent a bowel prep for your procedure, you may not have a normal bowel movement for a few days.  Please Note:  You might notice some irritation and congestion in your nose or some drainage.  This is from the oxygen used during your procedure.  There is no need for concern and it should clear up in a day or so.  SYMPTOMS TO REPORT IMMEDIATELY:  Following lower endoscopy (colonoscopy or flexible sigmoidoscopy):  Excessive amounts of blood in the stool  Significant tenderness or worsening of abdominal pains  Swelling of the abdomen that is new, acute  Fever of 100F or higher  For urgent or emergent issues, a gastroenterologist can be reached at any hour by calling [phone]. Do not use MyChart messaging for urgent concerns.    DIET:  We do recommend a small meal at first, but then you may proceed to your regular diet.  Drink plenty of fluids but you should avoid alcoholic beverages for 24  hours.  ACTIVITY:  You should plan to take it easy for the rest of today and you should NOT DRIVE or use heavy machinery until tomorrow (because of the sedation medicines used during the test).    FOLLOW UP: Our staff will call the number listed on your records the next business day following your procedure.  We will call around 7:15- 8:00 am to check on you and address any questions or concerns that you may have regarding the information given to you following your procedure. If we do not reach you, we will leave a message.     If any biopsies were taken you will be contacted by phone or by letter within the next 1-3 weeks.  Please call us at [phone] if you have not heard about the biopsies in 3 weeks.    SIGNATURES/CONFIDENTIALITY: You and/or your care partner have signed paperwork which will be entered into your electronic medical record.  These signatures attest to the fact that that the information above on your After Visit Summary has been reviewed and is understood.  Full responsibility of the confidentiality of this discharge information lies with you and/or your care-partner.

## 2022-08-11 NOTE — Progress Notes (Signed)
No changes to clinical history since GI office visit on 07/22/22.  The patient is appropriate for an endoscopic procedure in the ambulatory setting.  - Wilfrid Lund, MD

## 2022-08-11 NOTE — Progress Notes (Signed)
Pt's states no medical or surgical changes since previsit or office visit.Pt's states no medical or surgical changes since previsit or office visit. 

## 2022-08-12 ENCOUNTER — Telehealth: Payer: Self-pay | Admitting: *Deleted

## 2022-08-12 NOTE — Telephone Encounter (Signed)
Post procedure follow up call placed, no answer and left VM.  

## 2022-08-13 ENCOUNTER — Ambulatory Visit (INDEPENDENT_AMBULATORY_CARE_PROVIDER_SITE_OTHER): Payer: Medicare HMO | Admitting: Pharmacist

## 2022-08-13 DIAGNOSIS — E785 Hyperlipidemia, unspecified: Secondary | ICD-10-CM | POA: Diagnosis not present

## 2022-08-13 DIAGNOSIS — Z789 Other specified health status: Secondary | ICD-10-CM

## 2022-08-13 DIAGNOSIS — Z794 Long term (current) use of insulin: Secondary | ICD-10-CM | POA: Diagnosis not present

## 2022-08-13 DIAGNOSIS — E1169 Type 2 diabetes mellitus with other specified complication: Secondary | ICD-10-CM | POA: Diagnosis not present

## 2022-08-13 DIAGNOSIS — E669 Obesity, unspecified: Secondary | ICD-10-CM | POA: Diagnosis not present

## 2022-08-13 NOTE — Progress Notes (Signed)
08/13/2022 Name: [name]           MRN: [id number]       DOB: 12-14-1960     S:  62 yoF Presents for diabetes evaluation, education, and management She has multiple intolerances to medications.  She reports she is doing well and is stable on her current therapy.   Insurance coverage/medication affordability: [employer]   Patient denies adherence with medications due to intolerances. Current diabetes medications include: tresiba only, occasional metformin Current hypertension medications include: losartan Goal 130/80 Current hyperlipidemia medications include: on zetia , repatha allergy to atorvastatin, pravastatin, rosuvastatin STATIN ALLERGY/INTOLERANCE DOCUMENTED IN EMR g72     Patient denies hypoglycemic events.   Patient reported dietary habits: Eats 1 meals/day     O:   Recent Labs           Lab Results  Component Value Date    HGBA1C 8.8 (H) 06/09/2022        Lipid Panel   Labs (Brief)              Component Value Date/Time    CHOL 206 (H) 06/09/2022 1433    CHOL 136 12/21/2012 1707    TRIG 348 (H) 06/09/2022 1433    TRIG 192 (H) 04/21/2013 1512    TRIG 164 (H) 12/21/2012 1707    HDL 34 (L) 06/09/2022 1433    HDL 32 (L) 04/21/2013 1512    HDL 27 (L) 12/21/2012 1707    CHOLHDL 6.1 (H) 06/09/2022 1433    LDLCALC 112 (H) 06/09/2022 1433    LDLCALC 104 (H) 04/21/2013 1512    LDLCALC 76 12/21/2012 1707        Home fasting blood sugars: 200   2 hour post-meal/random blood sugars: up to      Clinical Atherosclerotic Cardiovascular Disease (ASCVD): No    The 10-year ASCVD risk score (Arnett DK, et al., 2019) is: 24.6%   Values used to calculate the score:     Age: 62 years     Sex: Female     Is Non-Hispanic African American: No     Diabetic: Yes     Tobacco smoker: Yes     Systolic Blood Pressure: [id number] mmHg     Is BP treated: Yes     HDL Cholesterol: 34 mg/dL     Total Cholesterol: 206 mg/dL     A/P:   Diabetes T2DM  currently uncontrolled. Patient is unable to take mounjao, rybelsus, ozempic, januvia and has now stopped metformin.  Beneficial option that remain are meal time insulin.  Patient has declined meal time insulin at this time, but we will revisit. She remains on glimepiride which likely has very little benefit at this point. Also, would not recommend SGLT2 due to high blood sugars and past AKA   -Continue tresiba insulin to 60-65 units nightly             [employer] patient assistance approved for tresiba             Patient states Bgs are better controlled with diet improvements  Patient has novolog on hand but has not had to use   -Continue Repatha for cholesterol             APPROVED for [employer] grant  G72 documented at last visit   -additional breztri samples given             approved for patient assistance   -Extensively discussed  pathophysiology of diabetes, recommended lifestyle interventions, dietary effects on blood sugar control   -Counseled on s/sx of and management of hypoglycemia   -Next A1C anticipated next PCP visit.    Written patient instructions provided.  Total time in face to face counseling 25 minutes.      Regina Eck, PharmD, [place] Clinical Pharmacist, [place]  II  T [phone]

## 2022-08-17 ENCOUNTER — Encounter: Payer: Self-pay | Admitting: Gastroenterology

## 2022-08-17 ENCOUNTER — Other Ambulatory Visit: Payer: Self-pay | Admitting: Family Medicine

## 2022-08-17 ENCOUNTER — Other Ambulatory Visit: Payer: Medicare HMO

## 2022-08-27 ENCOUNTER — Other Ambulatory Visit: Payer: Medicare HMO

## 2022-09-07 NOTE — Progress Notes (Signed)
[facility] Quality Team Note  Name: [name] Date of Birth: 03-11-1961 MRN: [id number] Date: 09/07/2022  [facility] Quality Team has reviewed this patient's chart, please see recommendations below:  [facility] Quality Other; (Called patient to offer retinal eye screening event at [facility] 09/10/2022, left voicemail.)

## 2022-09-08 ENCOUNTER — Ambulatory Visit (INDEPENDENT_AMBULATORY_CARE_PROVIDER_SITE_OTHER): Payer: Medicare HMO | Admitting: Family Medicine

## 2022-09-08 ENCOUNTER — Encounter: Payer: Self-pay | Admitting: Family Medicine

## 2022-09-08 VITALS — BP 116/68 | HR 85 | Temp 98.5°F | Ht 70.0 in | Wt 215.0 lb

## 2022-09-08 DIAGNOSIS — R112 Nausea with vomiting, unspecified: Secondary | ICD-10-CM | POA: Diagnosis not present

## 2022-09-08 DIAGNOSIS — E785 Hyperlipidemia, unspecified: Secondary | ICD-10-CM

## 2022-09-08 DIAGNOSIS — H6992 Unspecified Eustachian tube disorder, left ear: Secondary | ICD-10-CM | POA: Diagnosis not present

## 2022-09-08 DIAGNOSIS — E039 Hypothyroidism, unspecified: Secondary | ICD-10-CM

## 2022-09-08 DIAGNOSIS — N329 Bladder disorder, unspecified: Secondary | ICD-10-CM | POA: Diagnosis not present

## 2022-09-08 DIAGNOSIS — R300 Dysuria: Secondary | ICD-10-CM

## 2022-09-08 DIAGNOSIS — R1013 Epigastric pain: Secondary | ICD-10-CM | POA: Diagnosis not present

## 2022-09-08 DIAGNOSIS — E1169 Type 2 diabetes mellitus with other specified complication: Secondary | ICD-10-CM

## 2022-09-08 DIAGNOSIS — R39198 Other difficulties with micturition: Secondary | ICD-10-CM

## 2022-09-08 DIAGNOSIS — E1152 Type 2 diabetes mellitus with diabetic peripheral angiopathy with gangrene: Secondary | ICD-10-CM

## 2022-09-08 DIAGNOSIS — E1122 Type 2 diabetes mellitus with diabetic chronic kidney disease: Secondary | ICD-10-CM

## 2022-09-08 DIAGNOSIS — R7989 Other specified abnormal findings of blood chemistry: Secondary | ICD-10-CM | POA: Diagnosis not present

## 2022-09-08 DIAGNOSIS — I129 Hypertensive chronic kidney disease with stage 1 through stage 4 chronic kidney disease, or unspecified chronic kidney disease: Secondary | ICD-10-CM

## 2022-09-08 LAB — URINALYSIS, ROUTINE W REFLEX MICROSCOPIC
Bilirubin, UA: NEGATIVE
Glucose, UA: NEGATIVE
Ketones, UA: NEGATIVE
Leukocytes,UA: NEGATIVE
Nitrite, UA: NEGATIVE
RBC, UA: NEGATIVE
Specific Gravity, UA: 1.01 (ref 1.005–1.030)
Urobilinogen, Ur: 0.2 mg/dL (ref 0.2–1.0)
pH, UA: 6 (ref 5.0–7.5)

## 2022-09-08 LAB — MICROSCOPIC EXAMINATION
RBC, Urine: NONE SEEN /hpf (ref 0–2)
Renal Epithel, UA: NONE SEEN /hpf

## 2022-09-08 LAB — BAYER DCA HB A1C WAIVED: HB A1C (BAYER DCA - WAIVED): 7.6 % — ABNORMAL HIGH (ref 4.8–5.6)

## 2022-09-08 MED ORDER — FLUCONAZOLE 150 MG PO TABS: 150.0000 mg | ORAL_TABLET | Freq: Once | ORAL | 0 refills | Status: AC

## 2022-09-08 MED ORDER — LEVOCETIRIZINE DIHYDROCHLORIDE 5 MG PO TABS: 5.0000 mg | ORAL_TABLET | Freq: Every evening | ORAL | 3 refills | Status: DC

## 2022-09-08 MED ORDER — FREESTYLE LIBRE 3 SENSOR MISC
3 refills | Status: DC
Start: 2022-09-08 — End: 2023-02-24

## 2022-09-08 MED ORDER — FREESTYLE LIBRE READER DEVI
1.0000 [IU] | Freq: Every day | 1 refills | Status: DC
Start: 2022-09-08 — End: 2022-09-09

## 2022-09-08 MED ORDER — CEPHALEXIN 500 MG PO CAPS: 500.0000 mg | ORAL_CAPSULE | Freq: Two times a day (BID) | ORAL | 0 refills | Status: AC

## 2022-09-08 NOTE — Progress Notes (Signed)
Subjective: CC:DM PCP: Pam Norlander, DO [id number] [name] is a 62 y.o. female presenting to clinic today for:  1. Type 2 Diabetes with hypertension, hyperlipidemia:  She is utilizing the freestyle libre 2 currently.  Unfortunately she had 2 separate libre's which were nonfunctional and has yet to get a replacement for this.  She tried to get a sample from our office but notes that they were not compatible with her Elenor Legato 2 meter for what ever reason.  She was getting assistance for these through patient assistance program.  She would be interested in advancing to the [place] 3 if possible.  She does report that she has been holding her insulin over the last several weeks in efforts to control her blood sugar with diet alone.  Surprisingly, she has noticed less spikes in blood sugar after discontinuing the insulin.  She really has been trying to decrease unnecessary carbs in her diet  Last eye exam: needs Last foot exam: UTD Last A1c:  Lab Results  Component Value Date   HGBA1C 8.8 (H) 06/09/2022   Nephropathy screen indicated?: needs Last flu, zoster and/or pneumovax:  Immunization History  Administered Date(s) Administered   PFIZER(Purple Top)SARS-COV-2 Vaccination 01/25/2020, 02/15/2020   Td 06/08/2005   ROS: Per HPI  2.  Hypothyroidism Compliant with thyroid replacement.  No reports of tremor, heart palpitations.  She has lost weight but this has been through diet modification as above  3.  Ear pain Patient reports some ear pain that radiates down into the throat on the left.  She worries about an ear infection as this has been present for well over a week.  Denies any drainage, hearing loss or balance issues She does report lots of mucus production but is not on any oral antihistamines  4.  Urinary issues She thinks that she has a UTI.  She reports difficulty emptying her bladder sometimes.  She does report a history of some type of cyst in her bladder that was  supposed to be "watched" but she admits that she has not seen her urologist in over 7 years for this.  No hematuria.  Allergies  Allergen Reactions   Aleve [Naproxen Sodium] Hives and Itching   Cortisone Swelling    Internal organs   Mounjaro [Tirzepatide]     Vomiting, severe abdominal pain, diarrhea   Ozempic (0.25 Or 0.5 Mg-Dose) [Semaglutide(0.25 Or 0.5mg -Dos)] Diarrhea and Nausea Only   Prednisone Swelling    Makes internal organs swell   Statins Other (See Comments)    Myalgia even with Pravastatin   Vancomycin Itching   Janumet [Sitagliptin-Metformin Hcl]     Nausea/diarrhea    Semaglutide Diarrhea and Nausea And Vomiting    RYBELSUS --PATIENT CAN'T TOLERATE 3MG  DAILY   Past Medical History:  Diagnosis Date   Abscess 01/07/2021   Acute blood loss anemia    Arthritis    COPD (chronic obstructive pulmonary disease)    Deep vein thrombosis (DVT) of both lower extremities 01/05/2017   Depression    DM (diabetes mellitus)    DVT (deep venous thrombosis)    x5   Family history of colon cancer    GERD (gastroesophageal reflux disease)    Hyperlipemia    Hypertension    Hypothyroid    Mild protein-calorie malnutrition    Obesity    Osteomyelitis of great toe of right foot 07/04/2018   PVD (peripheral vascular disease)    S/P unilateral BKA (below knee amputation), right    Type  2 diabetes mellitus with diabetic peripheral angiopathy and gangrene, without long-term current use of insulin 12/27/2018   Unilateral complete BKA, right, initial encounter 07/11/2018   Unilateral complete BKA, right, subsequent encounter    Uterine cancer    UTI (urinary tract infection)    Wound dehiscence 01/24/2021    Current Outpatient Medications:    Accu-Chek Softclix Lancets lancets, Use as instructed to test BGs up to 3 times daily E11.52, Disp: 300 each, Rfl: 3   ascorbic acid (VITAMIN C) 500 MG tablet, Take 500 mg by mouth daily., Disp: , Rfl:    aspirin EC 81 MG EC tablet, Take 1  tablet (81 mg total) by mouth daily., Disp: , Rfl:    Budeson-Glycopyrrol-Formoterol (BREZTRI AEROSPHERE) 160-9-4.8 MCG/ACT AERO, Inhale 2 puffs into the lungs 2 (two) times daily., Disp: 32.1 g, Rfl: 11   Cholecalciferol (VITAMIN D3) 5000 units TABS, Take 5,000 Units by mouth daily., Disp: , Rfl:    clopidogrel (PLAVIX) 75 MG tablet, Take 1 tablet (75 mg total) by mouth daily., Disp: 90 tablet, Rfl: 3   diclofenac Sodium (VOLTAREN) 1 % GEL, APPLY 2 GRAMS UP TO 4 TIMES A DAY AS NEEDED, Disp: 300 g, Rfl: 1   escitalopram (LEXAPRO) 10 MG tablet, Take 1 tablet (10 mg total) by mouth daily., Disp: 90 tablet, Rfl: 3   Evolocumab (REPATHA SURECLICK) [id number] MG/ML SOAJ, Inject 140 mg into the skin every 14 (fourteen) days., Disp: 2 mL, Rfl: 5   furosemide (LASIX) 20 MG tablet, TAKE ONE TABLET DAILY, Disp: 90 tablet, Rfl: 0   glucose blood (ACCU-CHEK GUIDE) test strip, Check BS up to 3 times daily Dx E11.52, Disp: 300 strip, Rfl: 3   insulin degludec (TRESIBA FLEXTOUCH) 100 UNIT/ML FlexTouch Pen, Inject 40-60 Units into the skin at bedtime., Disp: 54 mL, Rfl: 3   Insulin Pen Needle (NOVOFINE PLUS PEN NEEDLE) 32G X 4 MM MISC, UAD with tresiba E11.9, Disp: 100 each, Rfl: 3   levothyroxine (SYNTHROID) 100 MCG tablet, TAKE ONE TABLET ONCE DAILY BEFORE BREAKFAST, Disp: 90 tablet, Rfl: 1   losartan (COZAAR) 50 MG tablet, Take 1 tablet (50 mg total) by mouth daily., Disp: 90 tablet, Rfl: 3   pantoprazole (PROTONIX) 40 MG tablet, Take 1 tablet (40 mg total) by mouth daily., Disp: 90 tablet, Rfl: 3   Potassium 99 MG TABS, Take 99 mg by mouth in the morning., Disp: , Rfl:    sennosides-docusate sodium (SENOKOT-S) 8.6-50 MG tablet, Take 1 tablet by mouth in the morning., Disp: , Rfl:    tretinoin (RETIN-A) 0.05 % cream, APPLY TO AFFECTED AREAS AT BEDTIME, Disp: 45 g, Rfl: 3 Social History   Socioeconomic History   Marital status: Divorced    Spouse name: Geneticist, molecular   Number of children: 1   Years of education: 16    Highest education level: Master's degree (e.g., MA, MS, MEng, MEd, MSW, MBA)  Occupational History   Occupation: Product manager: Art gallery manager   Occupation: retired  Tobacco Use   Smoking status: Every Day    Packs/day: 1.00    Years: 38.00    Additional pack years: 0.00    Total pack years: 38.00    Types: Cigarettes   Smokeless tobacco: Never  Vaping Use   Vaping Use: Never used  Substance and Sexual Activity   Alcohol use: No    Alcohol/week: 0.0 standard drinks of alcohol   Drug use: Yes    Frequency: 4.0 times per week  Types: Marijuana   Sexual activity: Not Currently    Birth control/protection: Surgical  Other Topics Concern   Not on file  Social History Narrative   Lives alone in one bedroom apartment - has a ramp outside - no stairs -handicap accessible bathroom   R BKA, but has prosthetic leg she uses to walk, drive, etc   Lots of family nearby, great relationships - lots of help when needed   Social Determinants of Health   Financial Resource Strain: Low Risk  (06/17/2022)   Overall Financial Resource Strain (CARDIA)    Difficulty of Paying Living Expenses: Not hard at all  Food Insecurity: Food Insecurity Present (06/17/2022)   Hunger Vital Sign    Worried About Running Out of Food in the Last Year: Sometimes true    Ran Out of Food in the Last Year: Never true  Transportation Needs: No Transportation Needs (06/17/2022)   PRAPARE - Hydrologist (Medical): No    Lack of Transportation (Non-Medical): No  Physical Activity: Inactive (06/17/2022)   Exercise Vital Sign    Days of Exercise per Week: 0 days    Minutes of Exercise per Session: 0 min  Stress: No Stress Concern Present (06/17/2022)   [place]    Feeling of Stress : Not at all  Social Connections: Moderately Isolated (06/17/2022)   Social Connection and Isolation Panel [NHANES]    Frequency of  Communication with Friends and Family: More than three times a week    Frequency of Social Gatherings with Friends and Family: More than three times a week    Attends Religious Services: More than 4 times per year    Active Member of [employer] or Organizations: No    Attends Archivist Meetings: Never    Marital Status: Divorced  Human resources officer Violence: Not At Risk (06/17/2022)   Humiliation, Afraid, Rape, and Kick questionnaire    Fear of Current or Ex-Partner: No    Emotionally Abused: No    Physically Abused: No    Sexually Abused: No   Family History  Problem Relation Age of Onset   Hypertension Mother    Hypothyroidism Mother    Heart disease Father    Diabetes Father    Hyperlipidemia Father    Stomach cancer Brother    Colon cancer Brother 35   Clotting disorder Brother    Diabetes Brother    Liver disease Neg Hx    Esophageal cancer Neg Hx     Objective: Office vital signs reviewed. BP 116/68   Pulse 85   Temp 98.5 F (36.9 C)   Ht 5\' 10"  (1.778 m)   Wt 215 lb (97.5 kg)   SpO2 95%   BMI 30.85 kg/m   Physical Examination:  General: Awake, alert, well nourished, No acute distress HEENT: sclera white, MMM Cardio: regular rate and rhythm, S1S2 heard, no murmurs appreciated Pulm: clear to auscultation bilaterally, no wheezes, rhonchi or rales; normal work of breathing on room air Extremities: Right BKA present with prosthesis in place.  She is ambulating independently  Assessment/ Plan: 62 y.o. female   Type 2 diabetes mellitus with diabetic peripheral angiopathy and gangrene, without long-term current use of insulin - Plan: Continuous Blood Gluc Receiver (FREESTYLE LIBRE READER) DEVI, Continuous Blood Gluc Sensor (FREESTYLE LIBRE 3 SENSOR) MISC  Hyperlipidemia associated with type 2 diabetes mellitus - Plan: Microalbumin / creatinine urine ratio, Bayer DCA Hb A1c Waived, Lipid  panel  Hypertension associated with chronic kidney disease due to type 2  diabetes mellitus  Acquired hypothyroidism - Plan: T4, free, TSH  Dysuria - Plan: Urine Culture, Urinalysis, Routine w reflex microscopic, cephALEXin (KEFLEX) 500 MG capsule  Nausea and vomiting, unspecified vomiting type - Plan: Lipase, CMP14+EGFR, CBC  Eustachian tube dysfunction, left - Plan: levocetirizine (XYZAL) 5 MG tablet  Difficulty urinating - Plan: Ambulatory referral to Urology  Lesion of bladder - Plan: Ambulatory referral to Urology  Sugar still not quite at goal but substantially improved with diet modification alone.  Wanted him to get the freestyle libre 3 for her and I will CC clinical pharmacy for assistance since they have been getting this through a separate [address]  Future order for fasting lipid panel placed.  She knows to come in and get this done at her earliest availability.  Check urine microalbumin  Blood pressure well-controlled.  No changes  Check thyroid levels  Urinalysis did not demonstrate any significant bacterial findings but given symptomology I will treat her with Keflex.  I suspect eustachian tube dysfunction on the left as there was no appreciable infection.  Start Xyzal  Given this reported cystic lesion of the bladder and some changes in urination going to refer her to urology for further assessment and management.  She apparently has been lost to follow-up as what ever lesion she had in the bladder was but is to be surveyed and hopefully   Orders Placed This Encounter  Procedures   Urine Culture   Microalbumin / creatinine urine ratio   Bayer DCA Hb A1c Waived   Urinalysis, Routine w reflex microscopic   No orders of the defined types were placed in this encounter.    Pam Norlander, DO [place] [phone]

## 2022-09-09 ENCOUNTER — Telehealth: Payer: Self-pay | Admitting: Pharmacist

## 2022-09-09 LAB — CBC
Hematocrit: 39.8 % (ref 34.0–46.6)
Hemoglobin: 12.7 g/dL (ref 11.1–15.9)
MCH: 25.6 pg — ABNORMAL LOW (ref 26.6–33.0)
MCHC: 31.9 g/dL (ref 31.5–35.7)
MCV: 80 fL (ref 79–97)
Platelets: 322 10*3/uL (ref 150–450)
RBC: 4.96 x10E6/uL (ref 3.77–5.28)
RDW: 15.8 % — ABNORMAL HIGH (ref 11.7–15.4)
WBC: 12.9 10*3/uL — ABNORMAL HIGH (ref 3.4–10.8)

## 2022-09-09 LAB — CMP14+EGFR
ALT: 12 IU/L (ref 0–32)
AST: 16 IU/L (ref 0–40)
Albumin/Globulin Ratio: 1.1 — ABNORMAL LOW (ref 1.2–2.2)
Albumin: 3.6 g/dL — ABNORMAL LOW (ref 3.8–4.9)
Alkaline Phosphatase: 74 IU/L (ref 44–121)
BUN/Creatinine Ratio: 13 (ref 12–28)
BUN: 11 mg/dL (ref 8–27)
Bilirubin Total: <0.2 mg/dL (ref 0.0–1.2)
CO2: 24 mmol/L (ref 20–29)
Calcium: 9.2 mg/dL (ref 8.7–10.3)
Chloride: 99 mmol/L (ref 96–106)
Creatinine, Ser: 0.86 mg/dL (ref 0.57–1.00)
Globulin, Total: 3.4 g/dL (ref 1.5–4.5)
Glucose: 157 mg/dL — ABNORMAL HIGH (ref 70–99)
Potassium: 4.2 mmol/L (ref 3.5–5.2)
Sodium: 137 mmol/L (ref 134–144)
Total Protein: 7 g/dL (ref 6.0–8.5)
eGFR: 77 mL/min/{1.73_m2} (ref >59)

## 2022-09-09 LAB — TSH: TSH: 0.692 u[IU]/mL (ref 0.450–4.500)

## 2022-09-09 LAB — LIPASE: Lipase: 28 U/L (ref 14–72)

## 2022-09-09 LAB — T4, FREE: Free T4: 1.4 ng/dL (ref 0.82–1.77)

## 2022-09-09 LAB — MICROALBUMIN / CREATININE URINE RATIO
Creatinine, Urine: 77.7 mg/dL
Microalb/Creat Ratio: 248 mg/g creat — ABNORMAL HIGH (ref 0–29)
Microalbumin, Urine: 192.6 ug/mL

## 2022-09-09 NOTE — Telephone Encounter (Signed)
    09/09/2022 Name: [name] MRN: [id number] DOB: 1960/09/25   Libre 3 CGM orders sent to [place] via parachute portal [facility] Medicare).  Will explore coverage for patient.  Last year her copay was $140 for 3 month supply.  Will follow.      Regina Eck, PharmD, [place] Clinical Pharmacist, [facility]

## 2022-09-10 LAB — URINE CULTURE

## 2022-09-18 ENCOUNTER — Ambulatory Visit: Payer: Medicare HMO

## 2022-09-18 ENCOUNTER — Ambulatory Visit
Admission: RE | Admit: 2022-09-18 | Discharge: 2022-09-18 | Disposition: A | Discharge status: Home / Self Care | Payer: Medicare HMO | Source: Ambulatory Visit | Attending: Family Medicine | Admitting: Family Medicine

## 2022-09-18 DIAGNOSIS — R928 Other abnormal and inconclusive findings on diagnostic imaging of breast: Secondary | ICD-10-CM | POA: Diagnosis not present

## 2022-10-28 ENCOUNTER — Telehealth: Payer: Self-pay | Admitting: *Deleted

## 2022-10-28 NOTE — Telephone Encounter (Signed)
Received Evaristo Bury and Novolog from patient assistance and I called to let the pt know and she states she isn't on it anymore and Raynelle Fanning knew about it. Will route to [place] to decide on what to do with the medication in the lab fridge.

## 2022-11-03 ENCOUNTER — Other Ambulatory Visit: Payer: Self-pay | Admitting: Family Medicine

## 2022-11-05 NOTE — Telephone Encounter (Signed)
Called novo nordisk to cancel auto refills.   If pt decides to continue medication, a new med refill form will need to be completed.

## 2022-11-24 ENCOUNTER — Encounter: Payer: Self-pay | Admitting: Nurse Practitioner

## 2022-11-24 ENCOUNTER — Telehealth (INDEPENDENT_AMBULATORY_CARE_PROVIDER_SITE_OTHER): Payer: Medicare HMO | Admitting: Nurse Practitioner

## 2022-11-24 DIAGNOSIS — S80821A Blister (nonthermal), right lower leg, initial encounter: Secondary | ICD-10-CM

## 2022-11-24 MED ORDER — DOXYCYCLINE HYCLATE 100 MG PO TABS: 100.0000 mg | ORAL_TABLET | Freq: Two times a day (BID) | ORAL | 0 refills | Status: DC

## 2022-11-24 NOTE — Progress Notes (Signed)
Virtual Visit Consent   [name], you are scheduled for a virtual visit with Mary-Margaret Daphine Deutscher, FNP, a [facility] provider, today.     Just as with appointments in the office, your consent must be obtained to participate.  Your consent will be active for this visit and any virtual visit you may have with one of our providers in the next 365 days.     If you have a MyChart account, a copy of this consent can be sent to you electronically.  All virtual visits are billed to your insurance company just like a traditional visit in the office.    As this is a virtual visit, video technology does not allow for your provider to perform a traditional examination.  This may limit your provider's ability to fully assess your condition.  If your provider identifies any concerns that need to be evaluated in person or the need to arrange testing (such as labs, EKG, etc.), we will make arrangements to do so.     Although advances in technology are sophisticated, we cannot ensure that it will always work on either your end or our end.  If the connection with a video visit is poor, the visit may have to be switched to a telephone visit.  With either a video or telephone visit, we are not always able to ensure that we have a secure connection.     I need to obtain your verbal consent now.   Are you willing to proceed with your visit today? YES   [name] has provided verbal consent on 11/24/2022 for a virtual visit (video or telephone).   Mary-Margaret Daphine Deutscher, FNP   Date: 11/24/2022 4:44 PM   Virtual Visit via Video Note   I, Mary-Margaret Daphine Deutscher, connected with [name] ([id number], 09/03/1960) on 11/24/22 at  6:00 PM EDT by a video-enabled telemedicine application and verified that I am speaking with the correct person using two identifiers.  Location: Patient: Virtual Visit Location Patient: Home Provider: Virtual Visit Location Provider: Mobile   I discussed  the limitations of evaluation and management by telemedicine and the availability of in person appointments. The patient expressed understanding and agreed to proceed.    History of Present Illness: [name] is a 62 y.o. who identifies as a female who was assigned female at birth, and is being seen today for blister.  HPI: Patient has a below knee prosthetic on right. She has a blister on her stump. Is slightly red. Afraid of infection.    Review of Systems  Constitutional:  Negative for diaphoresis and weight loss.  Eyes:  Negative for blurred vision, double vision and pain.  Respiratory:  Negative for shortness of breath.   Cardiovascular:  Negative for chest pain, palpitations, orthopnea and leg swelling.  Gastrointestinal:  Negative for abdominal pain.  Skin:  Negative for rash.  Neurological:  Negative for dizziness, sensory change, loss of consciousness, weakness and headaches.  Endo/Heme/Allergies:  Negative for polydipsia. Does not bruise/bleed easily.  Psychiatric/Behavioral:  Negative for memory loss. The patient does not have insomnia.   All other systems reviewed and are negative.   Problems:  Patient Active Problem List   Diagnosis Date Noted   Dehiscence of amputation stump (HCC)    History of DVT (deep vein thrombosis) 07/03/2019   Statin intolerance 04/19/2019   Vitamin D deficiency 12/27/2018   DVT of axillary vein, chronic, bilateral (HCC) 12/27/2018   Hypertension associated with chronic kidney disease  due to type 2 diabetes mellitus (HCC) 12/27/2018   Long term current use of anticoagulant 12/27/2018   Medically noncompliant 12/27/2018   Leukocytosis    Diabetes mellitus type 2 in obese    Acquired absence of right leg below knee (HCC) 07/08/2018   Intermittent claudication (HCC) 05/11/2017   Peripheral vascular insufficiency (HCC) 09/25/2015   Hypothyroidism 01/29/2014   Impetigo 07/28/2013   Chronic obstructive pulmonary disease (HCC)     Malignant neoplasm of uterus (HCC)    GERD (gastroesophageal reflux disease) 10/18/2012   Hernia, hiatal 10/18/2012   Hyperlipidemia associated with type 2 diabetes mellitus (HCC) 04/18/2008   SMOKER 04/18/2008    Allergies:  Allergies  Allergen Reactions   Aleve [Naproxen Sodium] Hives and Itching   Cortisone Swelling    Internal organs   Mounjaro [Tirzepatide]     Vomiting, severe abdominal pain, diarrhea   Ozempic (0.25 Or 0.5 Mg-Dose) [Semaglutide(0.25 Or 0.5mg -Dos)] Diarrhea and Nausea Only   Prednisone Swelling    Makes internal organs swell   Statins Other (See Comments)    Myalgia even with Pravastatin   Vancomycin Itching   Janumet [Sitagliptin-Metformin Hcl]     Nausea/diarrhea    Semaglutide Diarrhea and Nausea And Vomiting    RYBELSUS --PATIENT CAN'T TOLERATE 3MG  DAILY   Medications:  Current Outpatient Medications:    Accu-Chek Softclix Lancets lancets, Use as instructed to test BGs up to 3 times daily E11.52, Disp: 300 each, Rfl: 3   ascorbic acid (VITAMIN C) 500 MG tablet, Take 500 mg by mouth daily., Disp: , Rfl:    aspirin EC 81 MG EC tablet, Take 1 tablet (81 mg total) by mouth daily., Disp: , Rfl:    Budeson-Glycopyrrol-Formoterol (BREZTRI AEROSPHERE) 160-9-4.8 MCG/ACT AERO, Inhale 2 puffs into the lungs 2 (two) times daily., Disp: 32.1 g, Rfl: 11   Cholecalciferol (VITAMIN D3) 5000 units TABS, Take 5,000 Units by mouth daily., Disp: , Rfl:    clopidogrel (PLAVIX) 75 MG tablet, Take 1 tablet (75 mg total) by mouth daily., Disp: 90 tablet, Rfl: 3   Continuous Blood Gluc Sensor (FREESTYLE LIBRE 3 SENSOR) MISC, Place 1 sensor on the skin every 14 days. Use to check glucose continuously E11.52, Disp: 6 each, Rfl: 3   diclofenac Sodium (VOLTAREN) 1 % GEL, APPLY 2 GRAMS UP TO 4 TIMES A DAY AS NEEDED, Disp: 300 g, Rfl: 1   escitalopram (LEXAPRO) 10 MG tablet, Take 1 tablet (10 mg total) by mouth daily., Disp: 90 tablet, Rfl: 3   Evolocumab (REPATHA SURECLICK) 140  MG/ML SOAJ, Inject 140 mg into the skin every 14 (fourteen) days., Disp: 2 mL, Rfl: 5   furosemide (LASIX) 20 MG tablet, TAKE ONE TABLET DAILY, Disp: 90 tablet, Rfl: 0   glucose blood (ACCU-CHEK GUIDE) test strip, Check BS up to 3 times daily Dx E11.52, Disp: 300 strip, Rfl: 3   insulin degludec (TRESIBA FLEXTOUCH) 100 UNIT/ML FlexTouch Pen, Inject 40-60 Units into the skin at bedtime., Disp: 54 mL, Rfl: 3   Insulin Pen Needle (NOVOFINE PLUS PEN NEEDLE) 32G X 4 MM MISC, UAD with tresiba E11.9, Disp: 100 each, Rfl: 3   levocetirizine (XYZAL) 5 MG tablet, Take 1 tablet (5 mg total) by mouth every evening. For allergies/ fluid in ears, Disp: 90 tablet, Rfl: 3   levothyroxine (SYNTHROID) 100 MCG tablet, TAKE ONE TABLET ONCE DAILY BEFORE BREAKFAST, Disp: 90 tablet, Rfl: 1   losartan (COZAAR) 50 MG tablet, Take 1 tablet (50 mg total) by mouth daily., Disp:  90 tablet, Rfl: 3   pantoprazole (PROTONIX) 40 MG tablet, Take 1 tablet (40 mg total) by mouth daily., Disp: 90 tablet, Rfl: 3   Potassium 99 MG TABS, Take 99 mg by mouth in the morning., Disp: , Rfl:    sennosides-docusate sodium (SENOKOT-S) 8.6-50 MG tablet, Take 1 tablet by mouth in the morning., Disp: , Rfl:    tretinoin (RETIN-A) 0.05 % cream, APPLY TO AFFECTED AREAS AT BEDTIME, Disp: 45 g, Rfl: 3  Observations/Objective: Patient is well-developed, well-nourished in no acute distress.  Resting comfortably  at home.  Head is normocephalic, atraumatic.  No labored breathing.  Speech is clear and coherent with logical content.  Patient is alert and oriented at baseline.  2cm erythematous vesicle with surrounding erythema  Assessment and Plan:  Perlie Gold in today with chief complaint of Blister   1. Blister of right lower extremity, initial encounter On stump Soak in epsom salt Rest from prosthetic until resolves  Meds ordered this encounter  Medications   doxycycline (VIBRA-TABS) 100 MG tablet    Sig: Take 1 tablet (100 mg  total) by mouth 2 (two) times daily. 1 po bid    Dispense:  20 tablet    Refill:  0    Order Specific Question:   Supervising Provider    Answer:   Arville Care A [[id number]]       Follow Up Instructions: I discussed the assessment and treatment plan with the patient. The patient was provided an opportunity to ask questions and all were answered. The patient agreed with the plan and demonstrated an understanding of the instructions.  A copy of instructions were sent to the patient via MyChart.  The patient was advised to call back or seek an in-person evaluation if the symptoms worsen or if the condition fails to improve as anticipated.  Time:  I spent 7 minutes with the patient via telehealth technology discussing the above problems/concerns.    Mary-Margaret Daphine Deutscher, FNP vid

## 2022-12-16 ENCOUNTER — Other Ambulatory Visit: Payer: Self-pay | Admitting: Family Medicine

## 2022-12-18 ENCOUNTER — Encounter: Payer: Self-pay | Admitting: Family Medicine

## 2022-12-18 ENCOUNTER — Ambulatory Visit (INDEPENDENT_AMBULATORY_CARE_PROVIDER_SITE_OTHER): Payer: Medicare HMO | Admitting: Family Medicine

## 2022-12-18 VITALS — BP 107/62 | HR 81 | Temp 97.6°F | Ht 70.0 in | Wt 212.6 lb

## 2022-12-18 DIAGNOSIS — Z7984 Long term (current) use of oral hypoglycemic drugs: Secondary | ICD-10-CM

## 2022-12-18 DIAGNOSIS — E1159 Type 2 diabetes mellitus with other circulatory complications: Secondary | ICD-10-CM

## 2022-12-18 DIAGNOSIS — I129 Hypertensive chronic kidney disease with stage 1 through stage 4 chronic kidney disease, or unspecified chronic kidney disease: Secondary | ICD-10-CM

## 2022-12-18 DIAGNOSIS — E1169 Type 2 diabetes mellitus with other specified complication: Secondary | ICD-10-CM

## 2022-12-18 DIAGNOSIS — N189 Chronic kidney disease, unspecified: Secondary | ICD-10-CM

## 2022-12-18 DIAGNOSIS — T8740 Infection of amputation stump, unspecified extremity: Secondary | ICD-10-CM | POA: Diagnosis not present

## 2022-12-18 DIAGNOSIS — E785 Hyperlipidemia, unspecified: Secondary | ICD-10-CM

## 2022-12-18 DIAGNOSIS — E1152 Type 2 diabetes mellitus with diabetic peripheral angiopathy with gangrene: Secondary | ICD-10-CM | POA: Diagnosis not present

## 2022-12-18 DIAGNOSIS — E1122 Type 2 diabetes mellitus with diabetic chronic kidney disease: Secondary | ICD-10-CM

## 2022-12-18 LAB — BAYER DCA HB A1C WAIVED: HB A1C (BAYER DCA - WAIVED): 8.2 % — ABNORMAL HIGH (ref 4.8–5.6)

## 2022-12-18 MED ORDER — CLINDAMYCIN PHOSPHATE 1 % EX SWAB
CUTANEOUS | 3 refills | Status: AC
Start: 2022-12-18 — End: ?

## 2022-12-18 MED ORDER — DOXYCYCLINE HYCLATE 100 MG PO TABS
100.0000 mg | ORAL_TABLET | Freq: Two times a day (BID) | ORAL | 0 refills | Status: DC
Start: 2022-12-18 — End: 2023-12-31

## 2022-12-18 MED ORDER — METFORMIN HCL 1000 MG PO TABS: 1000.0000 mg | ORAL_TABLET | Freq: Two times a day (BID) | ORAL | 3 refills | Status: DC

## 2022-12-18 NOTE — Progress Notes (Signed)
Subjective: CC:DM PCP: Raliegh Ip, DO ONG:EXBMWU [name] is a 62 y.o. female presenting to clinic today for:  1. Type 2 Diabetes with hypertension, hyperlipidemia:  Glucometer:Freestyle Libre3.  She reports that blood sugars have been running roughly in the 200s for the last month or so.  She has been totally diet controlled but admits that she was on some metformin the last time she saw me.  She has since totally stopped everything and really is just monitoring her blood sugar closely with the freestyle libre.  She premed she is exclusively vegetables and meat with no fruits or sugars or carbs.  She does report some inflammation and sites on the right stump that appear consistent with an infection.  She was seen back 3 to 4 weeks ago and placed on doxycycline but she was drinking her doxycycline with coffee and did not realize that would minimize absorption.  Last eye exam: needs Last foot exam: UTD Last A1c:  Lab Results  Component Value Date   HGBA1C 7.6 (H) 09/08/2022   Nephropathy screen indicated?: UTD Last flu, zoster and/or pneumovax:  Immunization History  Administered Date(s) Administered   PFIZER(Purple Top)SARS-COV-2 Vaccination 01/25/2020, 02/15/2020   Td 06/08/2005    ROS: Per HPI  Allergies  Allergen Reactions   Aleve [Naproxen Sodium] Hives and Itching   Cortisone Swelling    Internal organs   Mounjaro [Tirzepatide]     Vomiting, severe abdominal pain, diarrhea   Ozempic (0.25 Or 0.5 Mg-Dose) [Semaglutide(0.25 Or 0.5mg -Dos)] Diarrhea and Nausea Only   Prednisone Swelling    Makes internal organs swell   Statins Other (See Comments)    Myalgia even with Pravastatin   Vancomycin Itching   Janumet [Sitagliptin-Metformin Hcl]     Nausea/diarrhea    Semaglutide Diarrhea and Nausea And Vomiting    RYBELSUS --PATIENT CAN'T TOLERATE 3MG  DAILY   Past Medical History:  Diagnosis Date   Abscess 01/07/2021   Acute blood loss anemia    Arthritis     COPD (chronic obstructive pulmonary disease) (HCC)    Deep vein thrombosis (DVT) of both lower extremities (HCC) 01/05/2017   Depression    DM (diabetes mellitus) (HCC)    DVT (deep venous thrombosis) (HCC)    x5   Family history of colon cancer    GERD (gastroesophageal reflux disease)    Hyperlipemia    Hypertension    Hypothyroid    Mild protein-calorie malnutrition (HCC)    Obesity    Osteomyelitis of great toe of right foot (HCC) 07/04/2018   PVD (peripheral vascular disease) (HCC)    S/P unilateral BKA (below knee amputation), right (HCC)    Type 2 diabetes mellitus with diabetic peripheral angiopathy and gangrene, without long-term current use of insulin (HCC) 12/27/2018   Unilateral complete BKA, right, initial encounter (HCC) 07/11/2018   Unilateral complete BKA, right, subsequent encounter (HCC)    Uterine cancer (HCC)    UTI (urinary tract infection)    Wound dehiscence 01/24/2021    Current Outpatient Medications:    Accu-Chek Softclix Lancets lancets, Use as instructed to test BGs up to 3 times daily E11.52, Disp: 300 each, Rfl: 3   ascorbic acid (VITAMIN C) 500 MG tablet, Take 500 mg by mouth daily., Disp: , Rfl:    aspirin EC 81 MG EC tablet, Take 1 tablet (81 mg total) by mouth daily., Disp: , Rfl:    Budeson-Glycopyrrol-Formoterol (BREZTRI AEROSPHERE) 160-9-4.8 MCG/ACT AERO, Inhale 2 puffs into the lungs 2 (two) times  daily., Disp: 32.1 g, Rfl: 11   Cholecalciferol (VITAMIN D3) 5000 units TABS, Take 5,000 Units by mouth daily., Disp: , Rfl:    clopidogrel (PLAVIX) 75 MG tablet, Take 1 tablet (75 mg total) by mouth daily., Disp: 90 tablet, Rfl: 3   Continuous Blood Gluc Sensor (FREESTYLE LIBRE 3 SENSOR) MISC, Place 1 sensor on the skin every 14 days. Use to check glucose continuously E11.52, Disp: 6 each, Rfl: 3   diclofenac Sodium (VOLTAREN) 1 % GEL, APPLY 2 GRAMS UP TO 4 TIMES A DAY AS NEEDED, Disp: 300 g, Rfl: 1   doxycycline (VIBRA-TABS) 100 MG tablet, Take 1  tablet (100 mg total) by mouth 2 (two) times daily. 1 po bid, Disp: 20 tablet, Rfl: 0   escitalopram (LEXAPRO) 10 MG tablet, Take 1 tablet (10 mg total) by mouth daily., Disp: 90 tablet, Rfl: 3   Evolocumab (REPATHA SURECLICK) 140 MG/ML SOAJ, Inject 140 mg into the skin every 14 (fourteen) days., Disp: 2 mL, Rfl: 5   furosemide (LASIX) 20 MG tablet, TAKE ONE TABLET DAILY, Disp: 90 tablet, Rfl: 0   glucose blood (ACCU-CHEK GUIDE) test strip, Check BS up to 3 times daily Dx E11.52, Disp: 300 strip, Rfl: 3   insulin degludec (TRESIBA FLEXTOUCH) 100 UNIT/ML FlexTouch Pen, Inject 40-60 Units into the skin at bedtime., Disp: 54 mL, Rfl: 3   Insulin Pen Needle (NOVOFINE PLUS PEN NEEDLE) 32G X 4 MM MISC, UAD with tresiba E11.9, Disp: 100 each, Rfl: 3   levocetirizine (XYZAL) 5 MG tablet, Take 1 tablet (5 mg total) by mouth every evening. For allergies/ fluid in ears, Disp: 90 tablet, Rfl: 3   levothyroxine (SYNTHROID) 100 MCG tablet, TAKE ONE TABLET ONCE DAILY BEFORE BREAKFAST, Disp: 90 tablet, Rfl: 3   losartan (COZAAR) 50 MG tablet, Take 1 tablet (50 mg total) by mouth daily., Disp: 90 tablet, Rfl: 3   pantoprazole (PROTONIX) 40 MG tablet, Take 1 tablet (40 mg total) by mouth daily., Disp: 90 tablet, Rfl: 3   Potassium 99 MG TABS, Take 99 mg by mouth in the morning., Disp: , Rfl:    sennosides-docusate sodium (SENOKOT-S) 8.6-50 MG tablet, Take 1 tablet by mouth in the morning., Disp: , Rfl:    tretinoin (RETIN-A) 0.05 % cream, APPLY TO AFFECTED AREAS AT BEDTIME, Disp: 45 g, Rfl: 3 Social History   Socioeconomic History   Marital status: Divorced    Spouse name: Archivist   Number of children: 1   Years of education: 16   Highest education level: Master's degree (e.g., MA, MS, MEng, MEd, MSW, MBA)  Occupational History   Occupation: Magazine features editor: Sports coach   Occupation: retired  Tobacco Use   Smoking status: Every Day    Current packs/day: 1.00    Average packs/day: 1 pack/day for  38.0 years (38.0 ttl pk-yrs)    Types: Cigarettes   Smokeless tobacco: Never  Vaping Use   Vaping status: Never Used  Substance and Sexual Activity   Alcohol use: No    Alcohol/week: 0.0 standard drinks of alcohol   Drug use: Yes    Frequency: 4.0 times per week    Types: Marijuana   Sexual activity: Not Currently    Birth control/protection: Surgical  Other Topics Concern   Not on file  Social History Narrative   Lives alone in one bedroom apartment - has a ramp outside - no stairs -handicap accessible bathroom   R BKA, but has prosthetic leg she uses to  walk, drive, etc   Lots of family nearby, great relationships - lots of help when needed   Social Determinants of Health   Financial Resource Strain: Low Risk  (06/17/2022)   Overall Financial Resource Strain (CARDIA)    Difficulty of Paying Living Expenses: Not hard at all  Food Insecurity: Food Insecurity Present (06/17/2022)   Hunger Vital Sign    Worried About Running Out of Food in the Last Year: Sometimes true    Ran Out of Food in the Last Year: Never true  Transportation Needs: No Transportation Needs (06/17/2022)   PRAPARE - Administrator, Civil Service (Medical): No    Lack of Transportation (Non-Medical): No  Physical Activity: Inactive (06/17/2022)   Exercise Vital Sign    Days of Exercise per Week: 0 days    Minutes of Exercise per Session: 0 min  Stress: No Stress Concern Present (06/17/2022)   [employer] of Occupational Health - Occupational Stress Questionnaire    Feeling of Stress : Not at all  Social Connections: Moderately Isolated (06/17/2022)   Social Connection and Isolation Panel [NHANES]    Frequency of Communication with Friends and Family: More than three times a week    Frequency of Social Gatherings with Friends and Family: More than three times a week    Attends Religious Services: More than 4 times per year    Active Member of [employer] or Organizations: No    Attends Tax inspector Meetings: Never    Marital Status: Divorced  Catering manager Violence: Not At Risk (06/17/2022)   Humiliation, Afraid, Rape, and Kick questionnaire    Fear of Current or Ex-Partner: No    Emotionally Abused: No    Physically Abused: No    Sexually Abused: No   Family History  Problem Relation Age of Onset   Hypertension Mother    Hypothyroidism Mother    Heart disease Father    Diabetes Father    Hyperlipidemia Father    Stomach cancer Brother    Colon cancer Brother 81   Clotting disorder Brother    Diabetes Brother    Liver disease Neg Hx    Esophageal cancer Neg Hx     Objective: Office vital signs reviewed. BP 107/62   Pulse 81   Temp 97.6 F (36.4 C) (Temporal)   Ht 5\' 10"  (1.778 m)   Wt 212 lb 9.6 oz (96.4 kg)   SpO2 95%   BMI 30.50 kg/m   Physical Examination:  General: Awake, alert, well nourished, No acute distress HEENT: sclera white, mmm Cardio: regular rate and rhythm  Pulm: Normal work of breathing on room air MSK: Right lower extremity BKA.  She has an area of mild hyperemia but no overt pustule noted along the right lateral aspect of the stump  Assessment/ Plan: 62 y.o. female   Type 2 diabetes mellitus with diabetic peripheral angiopathy and gangrene, without long-term current use of insulin (HCC) - Plan: Bayer DCA Hb A1c Waived, Lipid panel, metFORMIN (GLUCOPHAGE) 1000 MG tablet  Hyperlipidemia associated with type 2 diabetes mellitus (HCC) - Plan: Lipid panel  Hypertension associated with chronic kidney disease due to type 2 diabetes mellitus (HCC) - Plan: CMP14+EGFR, CBC with Differential/Platelet  Amputation stump infection (HCC) - Plan: clindamycin (CLEOCIN T) 1 % SWAB, doxycycline (VIBRA-TABS) 100 MG tablet  Diabetes is not controlled A1c rising to 8.2 today.  We discussed need for resumption of at minimum metformin and this has been sent.  She may  start with 500 mg daily for 2 weeks and advance 1000 mg daily thereafter.  I have  written so that she can have it twice daily if blood sugars remain above goal of 150.  Continue low-carb diet.  Continue Repatha  Continue Lasix as needed, Cozaar.  Blood pressure is controlled  I am placing her back on doxycycline and have also given her clindamycin pledgets to use twice daily to the affected areas in hopes that we can prevent skin breakdown and infection.  She may follow-up with me in 3 months, sooner if concerns arise  No orders of the defined types were placed in this encounter.  No orders of the defined types were placed in this encounter.    Raliegh Ip, DO [facility] [phone]

## 2022-12-18 NOTE — Patient Instructions (Signed)
Start with 1/2 tablet of Metformin once daily x2 weeks, Then increase to 1 tablet daily May increase to twice daily if sugars remain elevated

## 2022-12-19 LAB — CBC WITH DIFFERENTIAL/PLATELET
Basophils Absolute: 0.1 10*3/uL (ref 0.0–0.2)
Basos: 1 %
EOS (ABSOLUTE): 0.2 10*3/uL (ref 0.0–0.4)
Eos: 2 %
Hematocrit: 40.7 % (ref 34.0–46.6)
Hemoglobin: 13.1 g/dL (ref 11.1–15.9)
Immature Grans (Abs): 0 10*3/uL (ref 0.0–0.1)
Immature Granulocytes: 0 %
Lymphocytes Absolute: 3.4 10*3/uL — ABNORMAL HIGH (ref 0.7–3.1)
Lymphs: 32 %
MCH: 24.9 pg — ABNORMAL LOW (ref 26.6–33.0)
MCHC: 32.2 g/dL (ref 31.5–35.7)
MCV: 77 fL — ABNORMAL LOW (ref 79–97)
Monocytes Absolute: 0.5 10*3/uL (ref 0.1–0.9)
Monocytes: 5 %
Neutrophils Absolute: 6.5 10*3/uL (ref 1.4–7.0)
Neutrophils: 60 %
Platelets: 282 10*3/uL (ref 150–450)
RBC: 5.27 x10E6/uL (ref 3.77–5.28)
RDW: 15.7 % — ABNORMAL HIGH (ref 11.7–15.4)
WBC: 10.8 10*3/uL (ref 3.4–10.8)

## 2022-12-19 LAB — CMP14+EGFR
ALT: 9 IU/L (ref 0–32)
AST: 11 IU/L (ref 0–40)
Albumin: 3.7 g/dL — ABNORMAL LOW (ref 3.8–4.9)
Alkaline Phosphatase: 97 IU/L (ref 44–121)
BUN/Creatinine Ratio: 27 (ref 12–28)
BUN: 20 mg/dL (ref 8–27)
Bilirubin Total: <0.2 mg/dL (ref 0.0–1.2)
CO2: 23 mmol/L (ref 20–29)
Calcium: 9.2 mg/dL (ref 8.7–10.3)
Chloride: 102 mmol/L (ref 96–106)
Creatinine, Ser: 0.75 mg/dL (ref 0.57–1.00)
Globulin, Total: 3.3 g/dL (ref 1.5–4.5)
Glucose: 196 mg/dL — ABNORMAL HIGH (ref 70–99)
Potassium: 4.4 mmol/L (ref 3.5–5.2)
Sodium: 138 mmol/L (ref 134–144)
Total Protein: 7 g/dL (ref 6.0–8.5)
eGFR: 91 mL/min/{1.73_m2} (ref >59)

## 2022-12-19 LAB — LIPID PANEL
Chol/HDL Ratio: 3.4 ratio (ref 0.0–4.4)
Cholesterol, Total: 106 mg/dL (ref 100–199)
HDL: 31 mg/dL — ABNORMAL LOW (ref >39)
LDL Chol Calc (NIH): 38 mg/dL (ref 0–99)
Triglycerides: 239 mg/dL — ABNORMAL HIGH (ref 0–149)
VLDL Cholesterol Cal: 37 mg/dL (ref 5–40)

## 2023-01-04 LAB — HM DIABETES EYE EXAM

## 2023-01-06 DIAGNOSIS — H5213 Myopia, bilateral: Secondary | ICD-10-CM | POA: Diagnosis not present

## 2023-01-11 ENCOUNTER — Other Ambulatory Visit: Payer: Self-pay | Admitting: Family Medicine

## 2023-01-11 DIAGNOSIS — E1169 Type 2 diabetes mellitus with other specified complication: Secondary | ICD-10-CM

## 2023-01-29 ENCOUNTER — Encounter: Payer: Self-pay | Admitting: Pharmacist

## 2023-01-29 ENCOUNTER — Telehealth: Payer: Self-pay | Admitting: Pharmacist

## 2023-01-29 DIAGNOSIS — J418 Mixed simple and mucopurulent chronic bronchitis: Secondary | ICD-10-CM

## 2023-01-29 MED ORDER — BREZTRI AEROSPHERE 160-9-4.8 MCG/ACT IN AERO
2.0000 | INHALATION_SPRAY | Freq: Two times a day (BID) | RESPIRATORY_TRACT | 11 refills | Status: DC
Start: 2023-01-29 — End: 2023-09-29

## 2023-01-29 NOTE — Telephone Encounter (Signed)
    01/29/2023 Name: [name] MRN: [id number] DOB: 03-Oct-1960   Patient [place] for [facility] inhaler.  Updated RX escribed to [facility] mail order (pharmacy for [place] patient [place]).  Patient is stable on current regimen.  She will need to re-enroll for 2025 in November.  Patient is a current smoker (38 pack yr history) and has a diagnosis of COPD    Kieth Brightly, PharmD, BCACP Clinical Pharmacist, [facility]

## 2023-02-01 ENCOUNTER — Other Ambulatory Visit: Payer: Self-pay | Admitting: Family Medicine

## 2023-02-12 ENCOUNTER — Other Ambulatory Visit: Payer: Self-pay | Admitting: Family Medicine

## 2023-02-12 DIAGNOSIS — F339 Major depressive disorder, recurrent, unspecified: Secondary | ICD-10-CM

## 2023-02-12 DIAGNOSIS — K219 Gastro-esophageal reflux disease without esophagitis: Secondary | ICD-10-CM

## 2023-02-23 ENCOUNTER — Other Ambulatory Visit: Payer: Self-pay | Admitting: Family Medicine

## 2023-02-23 ENCOUNTER — Telehealth: Payer: Self-pay

## 2023-02-23 DIAGNOSIS — E1152 Type 2 diabetes mellitus with diabetic peripheral angiopathy with gangrene: Secondary | ICD-10-CM

## 2023-02-23 NOTE — Telephone Encounter (Signed)
[facility] calling to ask if you will send in a new prescription for the Free Style Libre 3 Plus sensor.  They are having a hard time getting in the regular Libre 3 sensors and are making the change over to the 3 Plus.

## 2023-02-24 ENCOUNTER — Telehealth: Payer: Self-pay | Admitting: Pharmacist

## 2023-02-24 MED ORDER — FREESTYLE LIBRE 3 PLUS SENSOR MISC: 2.0000 | 5 refills | Status: DC

## 2023-02-24 NOTE — Telephone Encounter (Signed)
Patient aware and verbalizes understanding. 

## 2023-02-24 NOTE — Telephone Encounter (Signed)
Please let patient know: We will not get libre samples until next week New RX for [employer] 3 PLUS called in to [facility] (regular libre 3 on backorder) Unsure if new prior Berkley Harvey is needed--will have to see Patient can use traditional glucometer in the interim

## 2023-02-24 NOTE — Telephone Encounter (Signed)
   Libre 3 on backorder --> libre 3 PLUS CGM called in to [facility].  New PA may be needed.  Patient to fingerstick in the meantime.  Kieth Brightly, PharmD, BCACP Clinical Pharmacist, [facility]

## 2023-03-08 ENCOUNTER — Other Ambulatory Visit: Payer: Self-pay | Admitting: Family Medicine

## 2023-03-08 DIAGNOSIS — E1169 Type 2 diabetes mellitus with other specified complication: Secondary | ICD-10-CM

## 2023-03-12 ENCOUNTER — Other Ambulatory Visit: Payer: Self-pay | Admitting: Family Medicine

## 2023-03-19 ENCOUNTER — Telehealth: Payer: Self-pay | Admitting: Family Medicine

## 2023-03-19 DIAGNOSIS — Z0279 Encounter for issue of other medical certificate: Secondary | ICD-10-CM

## 2023-03-19 NOTE — Telephone Encounter (Signed)
Pt dropped off STD forms to be completed and signed.  Form Fee Paid? (Y/N)       YES     If NO, form is placed on front office manager desk to hold until payment received. If YES, then form will be placed in the RX/HH Nurse Coordinators box for completion.  Form will not be processed until payment is received

## 2023-03-22 ENCOUNTER — Ambulatory Visit: Payer: Medicare HMO | Admitting: Family Medicine

## 2023-03-22 ENCOUNTER — Encounter: Payer: Self-pay | Admitting: Family Medicine

## 2023-03-22 VITALS — BP 121/68 | HR 85 | Temp 98.4°F | Ht 70.0 in | Wt 216.0 lb

## 2023-03-22 DIAGNOSIS — Z7984 Long term (current) use of oral hypoglycemic drugs: Secondary | ICD-10-CM

## 2023-03-22 DIAGNOSIS — E1169 Type 2 diabetes mellitus with other specified complication: Secondary | ICD-10-CM | POA: Diagnosis not present

## 2023-03-22 DIAGNOSIS — E1152 Type 2 diabetes mellitus with diabetic peripheral angiopathy with gangrene: Secondary | ICD-10-CM

## 2023-03-22 DIAGNOSIS — I152 Hypertension secondary to endocrine disorders: Secondary | ICD-10-CM

## 2023-03-22 DIAGNOSIS — E1159 Type 2 diabetes mellitus with other circulatory complications: Secondary | ICD-10-CM | POA: Diagnosis not present

## 2023-03-22 DIAGNOSIS — E785 Hyperlipidemia, unspecified: Secondary | ICD-10-CM | POA: Diagnosis not present

## 2023-03-22 DIAGNOSIS — Z89511 Acquired absence of right leg below knee: Secondary | ICD-10-CM | POA: Diagnosis not present

## 2023-03-22 LAB — BAYER DCA HB A1C WAIVED: HB A1C (BAYER DCA - WAIVED): 8.4 % — ABNORMAL HIGH (ref 4.8–5.6)

## 2023-03-22 NOTE — Progress Notes (Signed)
Subjective: CC:DM PCP: Raliegh Ip, DO NWG:NFAOZH [name] is a 62 y.o. female presenting to clinic today for:  1. Type 2 Diabetes with hypertension, hyperlipidemia:  She has been utilizing metformin 1000 mg daily.  Compliant with all other medications but not utilizing any insulin right now.  Continues to use freestyle libre for continuous glucose monitoring.  Reports no chest pain, shortness of breath or blurred vision.  She actually reports this is the best she has felt in a long time. Diabetes Health Maintenance Due  Topic Date Due   FOOT EXAM  06/10/2023   HEMOGLOBIN A1C  06/20/2023   OPHTHALMOLOGY EXAM  01/04/2024    Last A1c:  Lab Results  Component Value Date   HGBA1C 8.2 (H) 12/18/2022   ROS: Per HPI  Allergies  Allergen Reactions   Aleve [Naproxen Sodium] Hives and Itching   Cortisone Swelling    Internal organs   Mounjaro [Tirzepatide]     Vomiting, severe abdominal pain, diarrhea   Ozempic (0.25 Or 0.5 Mg-Dose) [Semaglutide(0.25 Or 0.5mg -Dos)] Diarrhea and Nausea Only   Prednisone Swelling    Makes internal organs swell   Statins Other (See Comments)    Myalgia even with Pravastatin   Vancomycin Itching   Janumet [Sitagliptin-Metformin Hcl]     Nausea/diarrhea    Semaglutide Diarrhea and Nausea And Vomiting    RYBELSUS --PATIENT CAN'T TOLERATE 3MG  DAILY   Past Medical History:  Diagnosis Date   Abscess 01/07/2021   Acute blood loss anemia    Arthritis    COPD (chronic obstructive pulmonary disease) (HCC)    Deep vein thrombosis (DVT) of both lower extremities (HCC) 01/05/2017   Dehiscence of amputation stump (HCC)    Depression    DM (diabetes mellitus) (HCC)    DVT (deep venous thrombosis) (HCC)    x5   Family history of colon cancer    GERD (gastroesophageal reflux disease)    Hyperlipemia    Hypertension    Hypothyroid    Malignant neoplasm of uterus (HCC)    Mild protein-calorie malnutrition (HCC)    Obesity    Osteomyelitis  of great toe of right foot (HCC) 07/04/2018   PVD (peripheral vascular disease) (HCC)    S/P unilateral BKA (below knee amputation), right (HCC)    Type 2 diabetes mellitus with diabetic peripheral angiopathy and gangrene, without long-term current use of insulin (HCC) 12/27/2018   Unilateral complete BKA, right, initial encounter (HCC) 07/11/2018   Unilateral complete BKA, right, subsequent encounter (HCC)    Uterine cancer (HCC)    UTI (urinary tract infection)    Wound dehiscence 01/24/2021    Current Outpatient Medications:    Accu-Chek Softclix Lancets lancets, Use as instructed to test BGs up to 3 times daily E11.52, Disp: 300 each, Rfl: 3   ascorbic acid (VITAMIN C) 500 MG tablet, Take 500 mg by mouth daily., Disp: , Rfl:    aspirin EC 81 MG EC tablet, Take 1 tablet (81 mg total) by mouth daily., Disp: , Rfl:    Budeson-Glycopyrrol-Formoterol (BREZTRI AEROSPHERE) 160-9-4.8 MCG/ACT AERO, Inhale 2 puffs into the lungs 2 (two) times daily., Disp: 32.1 g, Rfl: 11   Cholecalciferol (VITAMIN D3) 5000 units TABS, Take 5,000 Units by mouth daily., Disp: , Rfl:    clindamycin (CLEOCIN T) 1 % SWAB, Apply to affected areas twice daily., Disp: 180 each, Rfl: 3   clopidogrel (PLAVIX) 75 MG tablet, TAKE ONE TABLET ONCE DAILY, Disp: 90 tablet, Rfl: 0   Continuous  Glucose Sensor (FREESTYLE LIBRE 3 PLUS SENSOR) MISC, 2 each by Does not apply route See admin instructions. Apply sensor to back of arm every 15 days. DX: E11.65, Disp: 2 each, Rfl: 5   diclofenac Sodium (VOLTAREN) 1 % GEL, APPLY 2 GRAMS UP TO 4 TIMES A DAY AS NEEDED, Disp: 300 g, Rfl: 1   doxycycline (VIBRA-TABS) 100 MG tablet, Take 1 tablet (100 mg total) by mouth 2 (two) times daily. 1 po bid, Disp: 20 tablet, Rfl: 0   escitalopram (LEXAPRO) 10 MG tablet, TAKE ONE TABLET ONCE DAILY, Disp: 60 tablet, Rfl: 0   Evolocumab (REPATHA SURECLICK) 140 MG/ML SOAJ, INJECT 140 MG (1 ML) ONCE EVERY 2 WEEKS, Disp: 2 mL, Rfl: 0   furosemide (LASIX) 20  MG tablet, TAKE ONE TABLET DAILY, Disp: 90 tablet, Rfl: 0   glucose blood (ACCU-CHEK GUIDE) test strip, Check BS up to 3 times daily Dx E11.52, Disp: 300 strip, Rfl: 3   insulin degludec (TRESIBA FLEXTOUCH) 100 UNIT/ML FlexTouch Pen, Inject 40-60 Units into the skin at bedtime., Disp: 54 mL, Rfl: 3   Insulin Pen Needle (NOVOFINE PLUS PEN NEEDLE) 32G X 4 MM MISC, UAD with tresiba E11.9, Disp: 100 each, Rfl: 3   levocetirizine (XYZAL) 5 MG tablet, Take 1 tablet (5 mg total) by mouth every evening. For allergies/ fluid in ears, Disp: 90 tablet, Rfl: 3   levothyroxine (SYNTHROID) 100 MCG tablet, TAKE ONE TABLET ONCE DAILY BEFORE BREAKFAST, Disp: 90 tablet, Rfl: 3   losartan (COZAAR) 50 MG tablet, Take 1 tablet (50 mg total) by mouth daily., Disp: 90 tablet, Rfl: 3   metFORMIN (GLUCOPHAGE) 1000 MG tablet, Take 1 tablet (1,000 mg total) by mouth 2 (two) times daily with a meal., Disp: 180 tablet, Rfl: 3   pantoprazole (PROTONIX) 40 MG tablet, TAKE ONE TABLET ONCE DAILY, Disp: 60 tablet, Rfl: 0   Potassium 99 MG TABS, Take 99 mg by mouth in the morning., Disp: , Rfl:    sennosides-docusate sodium (SENOKOT-S) 8.6-50 MG tablet, Take 1 tablet by mouth in the morning., Disp: , Rfl:    tretinoin (RETIN-A) 0.05 % cream, APPLY TO AFFECTED AREAS AT BEDTIME, Disp: 45 g, Rfl: 3 Social History   Socioeconomic History   Marital status: Divorced    Spouse name: Archivist   Number of children: 1   Years of education: 16   Highest education level: Master's degree (e.g., MA, MS, MEng, MEd, MSW, MBA)  Occupational History   Occupation: Magazine features editor: Sports coach   Occupation: retired  Tobacco Use   Smoking status: Every Day    Current packs/day: 1.00    Average packs/day: 1 pack/day for 38.0 years (38.0 ttl pk-yrs)    Types: Cigarettes   Smokeless tobacco: Never  Vaping Use   Vaping status: Never Used  Substance and Sexual Activity   Alcohol use: No    Alcohol/week: 0.0 standard drinks of alcohol    Drug use: Yes    Frequency: 4.0 times per week    Types: Marijuana   Sexual activity: Not Currently    Birth control/protection: Surgical  Other Topics Concern   Not on file  Social History Narrative   Lives alone in one bedroom apartment - has a ramp outside - no stairs -handicap accessible bathroom   R BKA, but has prosthetic leg she uses to walk, drive, etc   Lots of family nearby, great relationships - lots of help when needed   Social Determinants of Health  Financial Resource Strain: Low Risk  (06/17/2022)   Overall Financial Resource Strain (CARDIA)    Difficulty of Paying Living Expenses: Not hard at all  Food Insecurity: Food Insecurity Present (06/17/2022)   Hunger Vital Sign    Worried About Running Out of Food in the Last Year: Sometimes true    Ran Out of Food in the Last Year: Never true  Transportation Needs: No Transportation Needs (06/17/2022)   PRAPARE - Administrator, Civil Service (Medical): No    Lack of Transportation (Non-Medical): No  Physical Activity: Inactive (06/17/2022)   Exercise Vital Sign    Days of Exercise per Week: 0 days    Minutes of Exercise per Session: 0 min  Stress: No Stress Concern Present (06/17/2022)   [employer] of Occupational Health - Occupational Stress Questionnaire    Feeling of Stress : Not at all  Social Connections: Moderately Isolated (06/17/2022)   Social Connection and Isolation Panel [NHANES]    Frequency of Communication with Friends and Family: More than three times a week    Frequency of Social Gatherings with Friends and Family: More than three times a week    Attends Religious Services: More than 4 times per year    Active Member of [employer] or Organizations: No    Attends Banker Meetings: Never    Marital Status: Divorced  Catering manager Violence: Not At Risk (06/17/2022)   Humiliation, Afraid, Rape, and Kick questionnaire    Fear of Current or Ex-Partner: No    Emotionally Abused:  No    Physically Abused: No    Sexually Abused: No   Family History  Problem Relation Age of Onset   Hypertension Mother    Hypothyroidism Mother    Heart disease Father    Diabetes Father    Hyperlipidemia Father    Stomach cancer Brother    Colon cancer Brother 68   Clotting disorder Brother    Diabetes Brother    Liver disease Neg Hx    Esophageal cancer Neg Hx     Objective: Office vital signs reviewed. BP 121/68   Pulse 85   Temp 98.4 F (36.9 C)   Ht 5\' 10"  (1.778 m)   Wt 216 lb (98 kg)   SpO2 96%   BMI 30.99 kg/m   Physical Examination:  General: Awake, alert, nontoxic-appearing female, No acute distress HEENT: Sclera white.  Moist mucous membranes Cardio: regular rate and rhythm, S1S2 heard, no murmurs appreciated Pulm: clear to auscultation bilaterally, no wheezes, rhonchi or rales; normal work of breathing on room air MSK: Right stump without evidence of infection.  She does have a small area of dried skin at the previous surgical site that has been chronic and stable.  No oozing or tenderness.  No skin breakdown  Assessment/ Plan: 62 y.o. female   Type 2 diabetes mellitus with diabetic peripheral angiopathy and gangrene, without long-term current use of insulin (HCC) - Plan: Bayer DCA Hb A1c Waived  Hyperlipidemia associated with type 2 diabetes mellitus (HCC)  Hypertension associated with diabetes (HCC)  Acquired absence of right leg below knee (HCC)  Sugar not at goal with A1c of 8.4.  She is going to advance her metformin.  I highly recommended that she watch diet.  Strongly may need to consider adding medication like insulin back if she cannot get her sugar under control with metformin alone  She will continue statin, blood pressure medication as directed  Stump appears healthy and stable.  Raliegh Ip, DO [facility] [phone]

## 2023-03-24 NOTE — Telephone Encounter (Signed)
PCP completed and signed attending physician forms. They have been faxed to [facility] at fax number [phone]. LMOVM and informed they are complete, copy at front desk.

## 2023-03-29 ENCOUNTER — Other Ambulatory Visit: Payer: Self-pay | Admitting: Family Medicine

## 2023-03-29 DIAGNOSIS — E1122 Type 2 diabetes mellitus with diabetic chronic kidney disease: Secondary | ICD-10-CM

## 2023-04-05 ENCOUNTER — Other Ambulatory Visit: Payer: Self-pay | Admitting: Family Medicine

## 2023-04-05 DIAGNOSIS — E1169 Type 2 diabetes mellitus with other specified complication: Secondary | ICD-10-CM

## 2023-04-21 ENCOUNTER — Other Ambulatory Visit: Payer: Self-pay | Admitting: Family Medicine

## 2023-04-21 DIAGNOSIS — K219 Gastro-esophageal reflux disease without esophagitis: Secondary | ICD-10-CM

## 2023-04-21 DIAGNOSIS — F339 Major depressive disorder, recurrent, unspecified: Secondary | ICD-10-CM

## 2023-05-14 ENCOUNTER — Other Ambulatory Visit: Payer: Self-pay | Admitting: Family Medicine

## 2023-06-21 ENCOUNTER — Other Ambulatory Visit: Payer: Self-pay | Admitting: Family Medicine

## 2023-06-21 ENCOUNTER — Ambulatory Visit: Payer: Medicare Other

## 2023-06-21 VITALS — Ht 70.0 in | Wt 216.0 lb

## 2023-06-21 DIAGNOSIS — Z0000 Encounter for general adult medical examination without abnormal findings: Secondary | ICD-10-CM | POA: Diagnosis not present

## 2023-06-21 NOTE — Progress Notes (Signed)
 Subjective:   [name] is a 63 y.o. female who presents for Medicare Annual (Subsequent) preventive examination.  Visit Complete: Virtual I connected with  [name] on 06/21/23 by a audio enabled telemedicine application and verified that I am speaking with the correct person using two identifiers.  Patient Location: Home  Provider Location: Home Office  I discussed the limitations of evaluation and management by telemedicine. The patient expressed understanding and agreed to proceed.  Vital Signs: Because this visit was a virtual/telehealth visit, some criteria may be missing or patient reported. Any vitals not documented were not able to be obtained and vitals that have been documented are patient reported.  Cardiac Risk Factors include: advanced age (>4men, >10 women);hypertension;diabetes mellitus;sedentary lifestyle;smoking/ tobacco exposure     Objective:    Today's Vitals   06/21/23 0921  Weight: 216 lb (98 kg)  Height: 5' 10 (1.778 m)   Body mass index is 30.99 kg/m.     06/21/2023    9:38 AM 07/01/2022    2:43 PM 06/17/2022   11:28 AM 06/13/2021    1:45 PM 01/24/2021    9:20 AM 10/09/2020    2:53 PM 08/09/2020   10:45 AM  Advanced Directives  Does Patient Have a Medical Advance Directive? No No No No No No No  Would patient like information on creating a medical advance directive? Yes (MAU/Ambulatory/Procedural Areas - Information given) No - Patient declined No - Patient declined Yes (MAU/Ambulatory/Procedural Areas - Information given) No - Patient declined  No - Patient declined    Current Medications (verified) Outpatient Encounter Medications as of 06/21/2023  Medication Sig   Accu-Chek Softclix Lancets lancets Use as instructed to test BGs up to 3 times daily E11.52   ascorbic acid  (VITAMIN C) 500 MG tablet Take 500 mg by mouth daily.   aspirin  EC 81 MG EC tablet Take 1 tablet (81 mg total) by mouth daily.    Budeson-Glycopyrrol-Formoterol  (BREZTRI  AEROSPHERE) 160-9-4.8 MCG/ACT AERO Inhale 2 puffs into the lungs 2 (two) times daily.   Cholecalciferol  (VITAMIN D3) 5000 units TABS Take 5,000 Units by mouth daily.   clindamycin  (CLEOCIN  T) 1 % SWAB Apply to affected areas twice daily.   clopidogrel  (PLAVIX ) 75 MG tablet TAKE ONE TABLET ONCE DAILY   Continuous Glucose Sensor (FREESTYLE LIBRE 3 PLUS SENSOR) MISC 2 each by Does not apply route See admin instructions. Apply sensor to back of arm every 15 days. DX: E11.65   diclofenac  Sodium (VOLTAREN ) 1 % GEL APPLY 2 GRAMS UP TO 4 TIMES A DAY AS NEEDED   doxycycline  (VIBRA -TABS) 100 MG tablet Take 1 tablet (100 mg total) by mouth 2 (two) times daily. 1 po bid   escitalopram  (LEXAPRO ) 10 MG tablet TAKE ONE TABLET ONCE DAILY   Evolocumab  (REPATHA  SURECLICK) 140 MG/ML SOAJ INJECT 140 MG (1 ML) ONCE EVERY 2 WEEKS   furosemide  (LASIX ) 20 MG tablet TAKE ONE TABLET DAILY   glucose blood (ACCU-CHEK GUIDE) test strip Check BS up to 3 times daily Dx E11.52   insulin  degludec (TRESIBA  FLEXTOUCH) 100 UNIT/ML FlexTouch Pen Inject 40-60 Units into the skin at bedtime.   Insulin  Pen Needle (NOVOFINE PLUS PEN NEEDLE) 32G X 4 MM MISC UAD with tresiba  E11.9   levocetirizine (XYZAL ) 5 MG tablet Take 1 tablet (5 mg total) by mouth every evening. For allergies/ fluid in ears   levothyroxine  (SYNTHROID ) 100 MCG tablet TAKE ONE TABLET ONCE DAILY BEFORE BREAKFAST   losartan  (COZAAR ) 50 MG tablet  TAKE ONE TABLET ONCE DAILY   metFORMIN  (GLUCOPHAGE ) 1000 MG tablet Take 1 tablet (1,000 mg total) by mouth 2 (two) times daily with a meal.   pantoprazole  (PROTONIX ) 40 MG tablet TAKE ONE TABLET ONCE DAILY   Potassium 99 MG TABS Take 99 mg by mouth in the morning.   sennosides-docusate sodium  (SENOKOT-S) 8.6-50 MG tablet Take 1 tablet by mouth in the morning.   tretinoin  (RETIN-A ) 0.05 % cream APPLY TO AFFECTED AREAS AT BEDTIME   No facility-administered encounter medications on file as  of 06/21/2023.    Allergies (verified) Aleve [naproxen sodium], Cortisone, Mounjaro [tirzepatide], Ozempic  (0.25 or 0.5 mg-dose) [semaglutide (0.25 or 0.5mg -dos)], Prednisone, Statins, Vancomycin , Janumet  [sitagliptin -metformin  hcl], and Semaglutide    History: Past Medical History:  Diagnosis Date   Abscess 01/07/2021   Acute blood loss anemia    Arthritis    COPD (chronic obstructive pulmonary disease) (HCC)    Deep vein thrombosis (DVT) of both lower extremities (HCC) 01/05/2017   Dehiscence of amputation stump (HCC)    Depression    DM (diabetes mellitus) (HCC)    DVT (deep venous thrombosis) (HCC)    x5   Family history of colon cancer    GERD (gastroesophageal reflux disease)    Hyperlipemia    Hypertension    Hypothyroid    Malignant neoplasm of uterus (HCC)    Mild protein-calorie malnutrition (HCC)    Obesity    Osteomyelitis of great toe of right foot (HCC) 07/04/2018   PVD (peripheral vascular disease) (HCC)    S/P unilateral BKA (below knee amputation), right (HCC)    Type 2 diabetes mellitus with diabetic peripheral angiopathy and gangrene, without long-term current use of insulin  (HCC) 12/27/2018   Unilateral complete BKA, right, initial encounter (HCC) 07/11/2018   Unilateral complete BKA, right, subsequent encounter (HCC)    Uterine cancer (HCC)    UTI (urinary tract infection)    Wound dehiscence 01/24/2021   Past Surgical History:  Procedure Laterality Date   ABDOMINAL HYSTERECTOMY     AMPUTATION Right 07/08/2018   Procedure: RIGHT BELOW KNEE AMPUTATION;  Surgeon: Harden Jerona GAILS, MD;  Location: [facility] OR;  Service: Orthopedics;  Laterality: Right;   CESAREAN SECTION     FEMORAL BYPASS     x 5   LUMBAR DISC SURGERY     L4-L5   STUMP REVISION Right 01/24/2021   Procedure: right below the knee amputation revision;  Surgeon: Harden Jerona GAILS, MD;  Location: [facility] OR;  Service: Orthopedics;  Laterality: Right;   TOE AMPUTATION     right   TONSILLECTOMY     Family  History  Problem Relation Age of Onset   Hypertension Mother    Hypothyroidism Mother    Heart disease Father    Diabetes Father    Hyperlipidemia Father    Stomach cancer Brother    Colon cancer Brother 92   Clotting disorder Brother    Diabetes Brother    Liver disease Neg Hx    Esophageal cancer Neg Hx    Social History   Socioeconomic History   Marital status: Divorced    Spouse name: Danny   Number of children: 1   Years of education: 16   Highest education level: Manufacturing engineer (e.g., MA, MS, MEng, MEd, MSW, MBA)  Occupational History   Occupation: Magazine Features Editor: [employer]   Occupation: retired  Tobacco Use   Smoking status: Every Day    Current packs/day: 1.00    Average packs/day:  1 pack/day for 38.0 years (38.0 ttl pk-yrs)    Types: Cigarettes   Smokeless tobacco: Never  Vaping Use   Vaping status: Never Used  Substance and Sexual Activity   Alcohol use: No    Alcohol/week: 0.0 standard drinks of alcohol   Drug use: Yes    Frequency: 4.0 times per week    Types: Marijuana   Sexual activity: Not Currently    Birth control/protection: Surgical  Other Topics Concern   Not on file  Social History Narrative   Lives alone in one bedroom apartment - has a ramp outside - no stairs -handicap accessible bathroom   R BKA, but has prosthetic leg she uses to walk, drive, etc   Lots of family nearby, great relationships - lots of help when needed   Social Drivers of Health   Financial Resource Strain: Low Risk  (06/21/2023)   Overall Financial Resource Strain (CARDIA)    Difficulty of Paying Living Expenses: Not hard at all  Food Insecurity: No Food Insecurity (06/21/2023)   Hunger Vital Sign    Worried About Running Out of Food in the Last Year: Never true    Ran Out of Food in the Last Year: Never true  Transportation Needs: No Transportation Needs (06/21/2023)   PRAPARE - Administrator, Civil Service (Medical): No    Lack of  Transportation (Non-Medical): No  Physical Activity: Inactive (06/21/2023)   Exercise Vital Sign    Days of Exercise per Week: 0 days    Minutes of Exercise per Session: 0 min  Stress: No Stress Concern Present (06/21/2023)   [employer] of Occupational Health - Occupational Stress Questionnaire    Feeling of Stress : Not at all  Social Connections: Moderately Isolated (06/21/2023)   Social Connection and Isolation Panel [NHANES]    Frequency of Communication with Friends and Family: More than three times a week    Frequency of Social Gatherings with Friends and Family: Three times a week    Attends Religious Services: More than 4 times per year    Active Member of Clubs or Organizations: No    Attends Banker Meetings: Never    Marital Status: Divorced    Tobacco Counseling Ready to quit: Not Answered Counseling given: Not Answered   Clinical Intake:  Pre-visit preparation completed: Yes  Pain : No/denies pain     Diabetes: Yes CBG done?: No Did pt. bring in CBG monitor from home?: No  How often do you need to have someone help you when you read instructions, pamphlets, or other written materials from your doctor or pharmacy?: 1 - Never  Interpreter Needed?: No  Information entered by :: Charmaine Bloodgood LPN   Activities of Daily Living    06/21/2023    9:37 AM  In your present state of health, do you have any difficulty performing the following activities:  Hearing? 0  Vision? 0  Difficulty concentrating or making decisions? 0  Walking or climbing stairs? 0  Dressing or bathing? 0  Doing errands, shopping? 0  Preparing Food and eating ? N  Using the Toilet? N  In the past six months, have you accidently leaked urine? N  Do you have problems with loss of bowel control? N  Managing your Medications? N  Managing your Finances? N  Housekeeping or managing your Housekeeping? N    Patient Care Team: [name], DO as PCP - General  (Family Medicine) Harden Jerona GAILS, MD as Consulting  Physician (Orthopedic Surgery) Vicci Mcardle, OD (Optometry) Billee Mliss BIRCH, [facility] as Pharmacist (Family Medicine) Ladora Ross Lacy Phebe, MD as Referring Physician (Optometry)  Indicate any recent Medical Services you may have received from other than [facility] providers in the past year (date may be approximate).     Assessment:   This is a routine wellness examination for [name].  Hearing/Vision screen Hearing Screening - Comments:: Denies hearing difficulties   Vision Screening - Comments:: Wears rx glasses - up to date with routine eye exams with Dr. Ladora     Goals Addressed   None   Depression Screen    06/21/2023    9:35 AM 03/22/2023    2:23 PM 12/18/2022    2:28 PM 09/08/2022    1:50 PM 06/17/2022   11:26 AM 06/09/2022    2:32 PM 10/06/2021    2:25 PM  PHQ 2/9 Scores  PHQ - 2 Score 0 0 0 0 0 0 0  PHQ- 9 Score  0 0 0 0 0     Fall Risk    06/21/2023    9:37 AM 03/22/2023    2:23 PM 12/18/2022    2:28 PM 09/08/2022    1:24 PM 06/17/2022   11:23 AM  Fall Risk   Falls in the past year? 0 0 0 0 0  Number falls in past yr: 0 0 0 0 0  Injury with Fall? 0 0 0 0 0  Risk for fall due to : No Fall Risks No Fall Risks No Fall Risks No Fall Risks No Fall Risks  Follow up Falls prevention discussed;Education provided;Falls evaluation completed Education provided Falls evaluation completed  Falls prevention discussed    MEDICARE RISK AT HOME: Medicare Risk at Home Any stairs in or around the home?: No If so, are there any without handrails?: No Home free of loose throw rugs in walkways, pet beds, electrical cords, etc?: Yes Adequate lighting in your home to reduce risk of falls?: Yes Life alert?: No Use of a cane, walker or w/c?: No Grab bars in the bathroom?: Yes Shower chair or bench in shower?: No Elevated toilet seat or a handicapped toilet?: Yes  TIMED UP AND GO:  Was the test performed?  No    Cognitive Function:         06/21/2023    9:38 AM 06/17/2022   11:28 AM 06/13/2021    1:29 PM 06/12/2020    1:53 PM 11/24/2018    9:39 AM  6CIT Screen  What Year? 0 points 0 points 0 points 0 points 0 points  What month? 0 points 0 points 0 points 0 points 0 points  What time? 0 points 0 points 0 points 0 points 0 points  Count back from 20 0 points 0 points 0 points 0 points 0 points  Months in reverse 0 points 0 points 0 points 0 points 0 points  Repeat phrase 0 points 0 points 0 points 0 points 0 points  Total Score 0 points 0 points 0 points 0 points 0 points    Immunizations Immunization History  Administered Date(s) Administered   PFIZER(Purple Top)SARS-COV-2 Vaccination 01/25/2020, 02/15/2020   Td 06/08/2005    TDAP status: Due, Education has been provided regarding the importance of this vaccine. Advised may receive this vaccine at local pharmacy or Health Dept. Aware to provide a copy of the vaccination record if obtained from local pharmacy or Health Dept. Verbalized acceptance and understanding.  Flu Vaccine status: Declined, Education has been  provided regarding the importance of this vaccine but patient still declined. Advised may receive this vaccine at local pharmacy or Health Dept. Aware to provide a copy of the vaccination record if obtained from local pharmacy or Health Dept. Verbalized acceptance and understanding.  Pneumococcal vaccine status: Declined,  Education has been provided regarding the importance of this vaccine but patient still declined. Advised may receive this vaccine at local pharmacy or Health Dept. Aware to provide a copy of the vaccination record if obtained from local pharmacy or Health Dept. Verbalized acceptance and understanding.   Covid-19 vaccine status: Declined, Education has been provided regarding the importance of this vaccine but patient still declined. Advised may receive this vaccine at local pharmacy or Health Dept.or vaccine clinic. Aware to provide a copy of the  vaccination record if obtained from local pharmacy or Health Dept. Verbalized acceptance and understanding.  Qualifies for Shingles Vaccine? Yes   Zostavax completed No   Shingrix Completed?: No.    Education has been provided regarding the importance of this vaccine. Patient has been advised to call insurance company to determine out of pocket expense if they have not yet received this vaccine. Advised may also receive vaccine at local pharmacy or Health Dept. Verbalized acceptance and understanding.  Screening Tests Health Maintenance  Topic Date Due   Pneumococcal Vaccine 49-73 Years old (1 of 2 - PCV) Never done   Lung Cancer Screening  07/20/2012   DTaP/Tdap/Td (2 - Tdap) 06/09/2015   COVID-19 Vaccine (3 - 2024-25 season) 02/07/2023   FOOT EXAM  06/10/2023   Zoster Vaccines- Shingrix (1 of 2) 06/22/2023 (Originally 07/20/2012)   INFLUENZA VACCINE  09/06/2023 (Originally 01/07/2023)   Diabetic kidney evaluation - Urine ACR  09/08/2023   MAMMOGRAM  09/18/2023   HEMOGLOBIN A1C  09/20/2023   Diabetic kidney evaluation - eGFR measurement  12/18/2023   OPHTHALMOLOGY EXAM  01/04/2024   Medicare Annual Wellness (AWV)  06/20/2024   Colonoscopy  08/11/2027   Hepatitis C Screening  Completed   HIV Screening  Completed   HPV VACCINES  Aged Out    Health Maintenance  Health Maintenance Due  Topic Date Due   Pneumococcal Vaccine 32-66 Years old (1 of 2 - PCV) Never done   Lung Cancer Screening  07/20/2012   DTaP/Tdap/Td (2 - Tdap) 06/09/2015   COVID-19 Vaccine (3 - 2024-25 season) 02/07/2023   FOOT EXAM  06/10/2023    Colorectal cancer screening: Type of screening: Colonoscopy. Completed 08/11/22. Repeat every 5 years  Mammogram status: Completed 09/18/22. Repeat every year  Lung Cancer Screening: (Low Dose CT Chest recommended if Age 59-80 years, 20 pack-year currently smoking OR have quit w/in 15years.) does qualify.   Lung Cancer Screening Referral: Patient will discuss with provider  at upcoming appointment   Additional Screening:  Hepatitis C Screening: does qualify; Completed 03/26/20  Vision Screening: Recommended annual ophthalmology exams for early detection of glaucoma and other disorders of the eye. Is the patient up to date with their annual eye exam?  Yes  Who is the provider or what is the name of the office in which the patient attends annual eye exams? Dr. Ladora  If pt is not established with a provider, would they like to be referred to a provider to establish care? No .   Dental Screening: Recommended annual dental exams for proper oral hygiene  Diabetic Foot Exam: Diabetic Foot Exam: Overdue, Pt has been advised about the importance in completing this exam. Pt is scheduled for  diabetic foot exam on at next office visit .  Community Resource Referral / Chronic Care Management: CRR required this visit?  No   CCM required this visit?  No     Plan:     I have personally reviewed and noted the following in the patient's chart:   Medical and social history Use of alcohol, tobacco or illicit drugs  Current medications and supplements including opioid prescriptions. Patient is not currently taking opioid prescriptions. Functional ability and status Nutritional status Physical activity Advanced directives List of other physicians Hospitalizations, surgeries, and ER visits in previous 12 months Vitals Screenings to include cognitive, depression, and falls Referrals and appointments  In addition, I have reviewed and discussed with patient certain preventive protocols, quality metrics, and best practice recommendations. A written personalized care plan for preventive services as well as general preventive health recommendations were provided to patient.     Lavelle Pfeiffer [place], [place]   [id number]   After Visit Summary: (MyChart) Due to this being a telephonic visit, the after visit summary with patients personalized plan was offered to patient via  MyChart   Nurse Notes: No concerns at this time

## 2023-06-21 NOTE — Patient Instructions (Signed)
 [name] , Thank you for taking time to come for your Medicare Wellness Visit. I appreciate your ongoing commitment to your health goals. Please review the following plan we discussed and let me know if I can assist you in the future.   Referrals/Orders/Follow-Ups/Clinician Recommendations: Aim for 30 minutes of exercise or brisk walking, 6-8 glasses of water, and 5 servings of fruits and vegetables each day.  This is a list of the screening recommended for you and due dates:  Health Maintenance  Topic Date Due   Pneumococcal Vaccination (1 of 2 - PCV) Never done   Screening for Lung Cancer  07/20/2012   DTaP/Tdap/Td vaccine (2 - Tdap) 06/09/2015   COVID-19 Vaccine (3 - 2024-25 season) 02/07/2023   Complete foot exam   06/10/2023   Zoster (Shingles) Vaccine (1 of 2) 06/22/2023*   Flu Shot  09/06/2023*   Yearly kidney health urinalysis for diabetes  09/08/2023   Mammogram  09/18/2023   Hemoglobin A1C  09/20/2023   Yearly kidney function blood test for diabetes  12/18/2023   Eye exam for diabetics  01/04/2024   Medicare Annual Wellness Visit  06/20/2024   Colon Cancer Screening  08/11/2027   Hepatitis C Screening  Completed   HIV Screening  Completed   HPV Vaccine  Aged Out  *Topic was postponed. The date shown is not the original due date.    Advanced directives: (ACP Link)Information on Advanced Care Planning can be found at [place]  Secretary of [facility] Advance Health Care Directives Advance Health Care Directives ([email or web address]   Next Medicare Annual Wellness Visit scheduled for next year: Yes

## 2023-06-25 ENCOUNTER — Other Ambulatory Visit: Payer: Self-pay | Admitting: Family Medicine

## 2023-06-25 ENCOUNTER — Telehealth: Payer: Self-pay

## 2023-06-25 DIAGNOSIS — E1169 Type 2 diabetes mellitus with other specified complication: Secondary | ICD-10-CM

## 2023-06-25 DIAGNOSIS — I129 Hypertensive chronic kidney disease with stage 1 through stage 4 chronic kidney disease, or unspecified chronic kidney disease: Secondary | ICD-10-CM

## 2023-06-25 NOTE — Telephone Encounter (Signed)
Copied from CRM [id number]. Topic: Clinical - Prescription Issue >> Jun 25, 2023  1:55 PM Antony Haste wrote: Reason for CRM: This patient is requesting a new application to be completed for her [employer] grant, she states this was completed for her about a year ago and it has now expired. She visited her pharmacy to receive a refill for this prescription but she was informed the cost of this refill would be $300. She states this is the only medication she has taken in the past 20 years where there is significant improvement with her cholesterol levels and she would like to continue this medication if possible.  Callback #: [phone]

## 2023-06-28 ENCOUNTER — Ambulatory Visit (INDEPENDENT_AMBULATORY_CARE_PROVIDER_SITE_OTHER): Payer: Medicare Other | Admitting: Family Medicine

## 2023-06-28 ENCOUNTER — Encounter: Payer: Self-pay | Admitting: Family Medicine

## 2023-06-28 VITALS — BP 112/63 | HR 86 | Temp 98.2°F | Ht 70.0 in | Wt 222.8 lb

## 2023-06-28 DIAGNOSIS — E1169 Type 2 diabetes mellitus with other specified complication: Secondary | ICD-10-CM

## 2023-06-28 DIAGNOSIS — E1152 Type 2 diabetes mellitus with diabetic peripheral angiopathy with gangrene: Secondary | ICD-10-CM | POA: Diagnosis not present

## 2023-06-28 DIAGNOSIS — E1122 Type 2 diabetes mellitus with diabetic chronic kidney disease: Secondary | ICD-10-CM

## 2023-06-28 DIAGNOSIS — Z7984 Long term (current) use of oral hypoglycemic drugs: Secondary | ICD-10-CM | POA: Diagnosis not present

## 2023-06-28 DIAGNOSIS — E785 Hyperlipidemia, unspecified: Secondary | ICD-10-CM

## 2023-06-28 DIAGNOSIS — E1159 Type 2 diabetes mellitus with other circulatory complications: Secondary | ICD-10-CM

## 2023-06-28 DIAGNOSIS — Z789 Other specified health status: Secondary | ICD-10-CM

## 2023-06-28 DIAGNOSIS — Z89511 Acquired absence of right leg below knee: Secondary | ICD-10-CM | POA: Diagnosis not present

## 2023-06-28 DIAGNOSIS — N189 Chronic kidney disease, unspecified: Secondary | ICD-10-CM

## 2023-06-28 DIAGNOSIS — E039 Hypothyroidism, unspecified: Secondary | ICD-10-CM | POA: Diagnosis not present

## 2023-06-28 DIAGNOSIS — I129 Hypertensive chronic kidney disease with stage 1 through stage 4 chronic kidney disease, or unspecified chronic kidney disease: Secondary | ICD-10-CM | POA: Diagnosis not present

## 2023-06-28 MED ORDER — METFORMIN HCL ER 500 MG PO TB24: 1000.0000 mg | ORAL_TABLET | Freq: Every day | ORAL | 3 refills | Status: DC

## 2023-06-28 NOTE — Progress Notes (Signed)
Subjective: CC:DM PCP: Raliegh Ip, DO [id number] [name] is a 63 y.o. female presenting to clinic today for:  1. Type 2 Diabetes with hypertension, hyperlipidemia with history of right BKA:  Compliant with metformin.  She still has stayed off of insulin.  Reports average blood sugars on CGM is somewhere around 180.  She is compliant with her losartan, working with Raynelle Fanning for [employer].  Diabetes Health Maintenance Due  Topic Date Due   FOOT EXAM  06/10/2023   HEMOGLOBIN A1C  09/20/2023   OPHTHALMOLOGY EXAM  01/04/2024    Last A1c:  Lab Results  Component Value Date   HGBA1C 8.4 (H) 03/22/2023    ROS: No chest pain, shortness of breath.  No falls.  Denies any complications with right stump.  ROS: Per HPI  Allergies  Allergen Reactions   Aleve [Naproxen Sodium] Hives and Itching   Cortisone Swelling    Internal organs   Mounjaro [Tirzepatide]     Vomiting, severe abdominal pain, diarrhea   Ozempic (0.25 Or 0.5 Mg-Dose) [Semaglutide(0.25 Or 0.5mg -Dos)] Diarrhea and Nausea Only   Prednisone Swelling    Makes internal organs swell   Statins Other (See Comments)    Myalgia even with Pravastatin   Vancomycin Itching   Janumet [Sitagliptin-Metformin Hcl]     Nausea/diarrhea    Semaglutide Diarrhea and Nausea And Vomiting    RYBELSUS --PATIENT CAN'T TOLERATE 3MG  DAILY   Past Medical History:  Diagnosis Date   Abscess 01/07/2021   Acute blood loss anemia    Arthritis    COPD (chronic obstructive pulmonary disease) (HCC)    Deep vein thrombosis (DVT) of both lower extremities (HCC) 01/05/2017   Dehiscence of amputation stump (HCC)    Depression    DM (diabetes mellitus) (HCC)    DVT (deep venous thrombosis) (HCC)    x5   Family history of colon cancer    GERD (gastroesophageal reflux disease)    Hyperlipemia    Hypertension    Hypothyroid    Malignant neoplasm of uterus (HCC)    Mild protein-calorie malnutrition (HCC)    Obesity    Osteomyelitis of  great toe of right foot (HCC) 07/04/2018   PVD (peripheral vascular disease) (HCC)    S/P unilateral BKA (below knee amputation), right (HCC)    Type 2 diabetes mellitus with diabetic peripheral angiopathy and gangrene, without long-term current use of insulin (HCC) 12/27/2018   Unilateral complete BKA, right, initial encounter (HCC) 07/11/2018   Unilateral complete BKA, right, subsequent encounter (HCC)    Uterine cancer (HCC)    UTI (urinary tract infection)    Wound dehiscence 01/24/2021    Current Outpatient Medications:    Accu-Chek Softclix Lancets lancets, Use as instructed to test BGs up to 3 times daily E11.52, Disp: 300 each, Rfl: 3   ascorbic acid (VITAMIN C) 500 MG tablet, Take 500 mg by mouth daily., Disp: , Rfl:    aspirin EC 81 MG EC tablet, Take 1 tablet (81 mg total) by mouth daily., Disp: , Rfl:    Budeson-Glycopyrrol-Formoterol (BREZTRI AEROSPHERE) 160-9-4.8 MCG/ACT AERO, Inhale 2 puffs into the lungs 2 (two) times daily., Disp: 32.1 g, Rfl: 11   Cholecalciferol (VITAMIN D3) 5000 units TABS, Take 5,000 Units by mouth daily., Disp: , Rfl:    clindamycin (CLEOCIN T) 1 % SWAB, Apply to affected areas twice daily., Disp: 180 each, Rfl: 3   clopidogrel (PLAVIX) 75 MG tablet, TAKE ONE TABLET ONCE DAILY, Disp: 90 tablet, Rfl: 0  Continuous Glucose Sensor (FREESTYLE LIBRE 3 PLUS SENSOR) MISC, 2 each by Does not apply route See admin instructions. Apply sensor to back of arm every 15 days. DX: E11.65, Disp: 2 each, Rfl: 5   diclofenac Sodium (VOLTAREN) 1 % GEL, APPLY 2 GRAMS UP TO 4 TIMES A DAY AS NEEDED, Disp: 300 g, Rfl: 1   doxycycline (VIBRA-TABS) 100 MG tablet, Take 1 tablet (100 mg total) by mouth 2 (two) times daily. 1 po bid, Disp: 20 tablet, Rfl: 0   escitalopram (LEXAPRO) 10 MG tablet, TAKE ONE TABLET ONCE DAILY, Disp: 60 tablet, Rfl: 1   Evolocumab (REPATHA SURECLICK) 140 MG/ML SOAJ, INJECT 140 MG (1 ML) ONCE EVERY 2 WEEKS, Disp: 2 mL, Rfl: 0   furosemide (LASIX) 20 MG  tablet, TAKE ONE TABLET DAILY, Disp: 90 tablet, Rfl: 0   glucose blood (ACCU-CHEK GUIDE) test strip, Check BS up to 3 times daily Dx E11.52, Disp: 300 strip, Rfl: 3   insulin degludec (TRESIBA FLEXTOUCH) 100 UNIT/ML FlexTouch Pen, Inject 40-60 Units into the skin at bedtime., Disp: 54 mL, Rfl: 3   Insulin Pen Needle (NOVOFINE PLUS PEN NEEDLE) 32G X 4 MM MISC, UAD with tresiba E11.9, Disp: 100 each, Rfl: 3   levocetirizine (XYZAL) 5 MG tablet, Take 1 tablet (5 mg total) by mouth every evening. For allergies/ fluid in ears, Disp: 90 tablet, Rfl: 3   levothyroxine (SYNTHROID) 100 MCG tablet, TAKE ONE TABLET ONCE DAILY BEFORE BREAKFAST, Disp: 90 tablet, Rfl: 3   losartan (COZAAR) 50 MG tablet, TAKE ONE TABLET ONCE DAILY, Disp: 90 tablet, Rfl: 0   metFORMIN (GLUCOPHAGE) 1000 MG tablet, Take 1 tablet (1,000 mg total) by mouth 2 (two) times daily with a meal., Disp: 180 tablet, Rfl: 3   pantoprazole (PROTONIX) 40 MG tablet, TAKE ONE TABLET ONCE DAILY, Disp: 60 tablet, Rfl: 1   Potassium 99 MG TABS, Take 99 mg by mouth in the morning., Disp: , Rfl:    sennosides-docusate sodium (SENOKOT-S) 8.6-50 MG tablet, Take 1 tablet by mouth in the morning., Disp: , Rfl:    tretinoin (RETIN-A) 0.05 % cream, APPLY TO AFFECTED AREAS AT BEDTIME, Disp: 45 g, Rfl: 3 Social History   Socioeconomic History   Marital status: Divorced    Spouse name: Archivist   Number of children: 1   Years of education: 16   Highest education level: Master's degree (e.g., MA, MS, MEng, MEd, MSW, MBA)  Occupational History   Occupation: Magazine features editor: Sports coach   Occupation: retired  Tobacco Use   Smoking status: Every Day    Current packs/day: 1.00    Average packs/day: 1 pack/day for 38.0 years (38.0 ttl pk-yrs)    Types: Cigarettes   Smokeless tobacco: Never  Vaping Use   Vaping status: Never Used  Substance and Sexual Activity   Alcohol use: No    Alcohol/week: 0.0 standard drinks of alcohol   Drug use: Yes     Frequency: 4.0 times per week    Types: Marijuana   Sexual activity: Not Currently    Birth control/protection: Surgical  Other Topics Concern   Not on file  Social History Narrative   Lives alone in one bedroom apartment - has a ramp outside - no stairs -handicap accessible bathroom   R BKA, but has prosthetic leg she uses to walk, drive, etc   Lots of family nearby, great relationships - lots of help when needed   Social Drivers of Corporate investment banker  Strain: Low Risk  (06/21/2023)   Overall Financial Resource Strain (CARDIA)    Difficulty of Paying Living Expenses: Not hard at all  Food Insecurity: No Food Insecurity (06/21/2023)   Hunger Vital Sign    Worried About Running Out of Food in the Last Year: Never true    Ran Out of Food in the Last Year: Never true  Transportation Needs: No Transportation Needs (06/21/2023)   PRAPARE - Administrator, Civil Service (Medical): No    Lack of Transportation (Non-Medical): No  Physical Activity: Inactive (06/21/2023)   Exercise Vital Sign    Days of Exercise per Week: 0 days    Minutes of Exercise per Session: 0 min  Stress: No Stress Concern Present (06/21/2023)   [employer] of Occupational Health - Occupational Stress Questionnaire    Feeling of Stress : Not at all  Social Connections: Moderately Isolated (06/21/2023)   Social Connection and Isolation Panel [NHANES]    Frequency of Communication with Friends and Family: More than three times a week    Frequency of Social Gatherings with Friends and Family: Three times a week    Attends Religious Services: More than 4 times per year    Active Member of Clubs or Organizations: No    Attends Banker Meetings: Never    Marital Status: Divorced  Catering manager Violence: Not At Risk (06/21/2023)   Humiliation, Afraid, Rape, and Kick questionnaire    Fear of Current or Ex-Partner: No    Emotionally Abused: No    Physically Abused: No    Sexually  Abused: No   Family History  Problem Relation Age of Onset   Hypertension Mother    Hypothyroidism Mother    Heart disease Father    Diabetes Father    Hyperlipidemia Father    Stomach cancer Brother    Colon cancer Brother 25   Clotting disorder Brother    Diabetes Brother    Liver disease Neg Hx    Esophageal cancer Neg Hx     Objective: Office vital signs reviewed. BP 112/63   Pulse 86   Temp 98.2 F (36.8 C)   Ht 5\' 10"  (1.778 m)   SpO2 96%   BMI 30.99 kg/m   Physical Examination:  General: Awake, alert, obese, No acute distress HEENT: sclera white, mmm.  No except Fala Mohs Cardio: regular rate and rhythm, S1S2 heard, no murmurs appreciated Pulm: clear to auscultation bilaterally, no wheezes, rhonchi or rales; normal work of breathing on room air  Diabetic Foot Exam - Simple   Simple Foot Form Diabetic Foot exam was performed with the following findings: Yes 06/28/2023  4:41 PM  Visual Inspection See comments: Yes Sensation Testing Intact to touch and monofilament testing bilaterally: Yes Pulse Check Posterior Tibialis and Dorsalis pulse intact bilaterally: Yes Comments Right BKA.  Left foot with sensation intact.  She had no ulcerations or preulcerative calluses present      Assessment/ Plan: 63 y.o. female   Type 2 diabetes mellitus with diabetic peripheral angiopathy and gangrene, without long-term current use of insulin (HCC) - Plan: Bayer DCA Hb A1c Waived, CBC with Differential, CMP14+EGFR, metFORMIN (GLUCOPHAGE-XR) 500 MG 24 hr tablet, AMB Referral [facility] Care Management  Hyperlipidemia associated with type 2 diabetes mellitus (HCC) - Plan: CMP14+EGFR, Lipid Panel, AMB Referral [facility]BCI Care Management  Hypertension associated with chronic kidney disease due to type 2 diabetes mellitus (HCC) - Plan: CMP14+EGFR, AMB Referral VBCI Care Management  Acquired absence of  right leg below knee (HCC) - Plan: AMB Referral VBCI Care Management  Statin  intolerance  Acquired hypothyroidism - Plan: TSH + free T4  Fasting labs ordered.  She will return at a later date to have these collected.  I have changed her metformin to extended release 1000 mg because she is missing her p.m. dose.  Hopefully this will help her gain better control of her blood sugar.  Continue CGM.  If fasting blood sugars remain above 150, strongly need to consider starting a low-dose insulin.  Would start it may be 10 units at bedtime.  I have referred her to pharmacy to help with Repatha.  She should continue Repatha for cholesterol control she is statin intolerant.  Her blood pressure was controlled and no changes are needed  Foot exam was performed.  Thyroid labs ordered  Raliegh Ip, DO [facility] [phone]

## 2023-06-30 NOTE — Telephone Encounter (Signed)
Patient scheduled for 07/08/23 with pharmD

## 2023-07-02 ENCOUNTER — Other Ambulatory Visit: Payer: Medicare Other

## 2023-07-05 ENCOUNTER — Encounter: Payer: Self-pay | Admitting: Family Medicine

## 2023-07-05 ENCOUNTER — Other Ambulatory Visit: Payer: Medicare Other

## 2023-07-05 DIAGNOSIS — E785 Hyperlipidemia, unspecified: Secondary | ICD-10-CM

## 2023-07-05 DIAGNOSIS — E039 Hypothyroidism, unspecified: Secondary | ICD-10-CM

## 2023-07-05 DIAGNOSIS — E1122 Type 2 diabetes mellitus with diabetic chronic kidney disease: Secondary | ICD-10-CM | POA: Diagnosis not present

## 2023-07-05 DIAGNOSIS — E1169 Type 2 diabetes mellitus with other specified complication: Secondary | ICD-10-CM | POA: Diagnosis not present

## 2023-07-05 DIAGNOSIS — E1152 Type 2 diabetes mellitus with diabetic peripheral angiopathy with gangrene: Secondary | ICD-10-CM

## 2023-07-05 LAB — LIPID PANEL

## 2023-07-05 LAB — BAYER DCA HB A1C WAIVED: HB A1C (BAYER DCA - WAIVED): 7.8 % — ABNORMAL HIGH (ref 4.8–5.6)

## 2023-07-06 LAB — CBC WITH DIFFERENTIAL/PLATELET
Basophils Absolute: 0.1 10*3/uL (ref 0.0–0.2)
Basos: 1 %
EOS (ABSOLUTE): 0.2 10*3/uL (ref 0.0–0.4)
Eos: 2 %
Hematocrit: 41 % (ref 34.0–46.6)
Hemoglobin: 13 g/dL (ref 11.1–15.9)
Immature Grans (Abs): 0.2 10*3/uL — ABNORMAL HIGH (ref 0.0–0.1)
Immature Granulocytes: 1 %
Lymphocytes Absolute: 3.6 10*3/uL — ABNORMAL HIGH (ref 0.7–3.1)
Lymphs: 33 %
MCH: 25.1 pg — ABNORMAL LOW (ref 26.6–33.0)
MCHC: 31.7 g/dL (ref 31.5–35.7)
MCV: 79 fL (ref 79–97)
Monocytes Absolute: 0.9 10*3/uL (ref 0.1–0.9)
Monocytes: 8 %
Neutrophils Absolute: 6 10*3/uL (ref 1.4–7.0)
Neutrophils: 55 %
Platelets: 296 10*3/uL (ref 150–450)
RBC: 5.18 x10E6/uL (ref 3.77–5.28)
RDW: 15.9 % — ABNORMAL HIGH (ref 11.7–15.4)
WBC: 10.9 10*3/uL — ABNORMAL HIGH (ref 3.4–10.8)

## 2023-07-06 LAB — TSH+FREE T4
Free T4: 1.32 ng/dL (ref 0.82–1.77)
TSH: 1.51 u[IU]/mL (ref 0.450–4.500)

## 2023-07-06 LAB — LIPID PANEL
Cholesterol, Total: 159 mg/dL (ref 100–199)
HDL: 37 mg/dL — ABNORMAL LOW (ref >39)
LDL CALC COMMENT:: 4.3 ratio (ref 0.0–4.4)
LDL Chol Calc (NIH): 93 mg/dL (ref 0–99)
Triglycerides: 169 mg/dL — ABNORMAL HIGH (ref 0–149)
VLDL Cholesterol Cal: 29 mg/dL (ref 5–40)

## 2023-07-06 LAB — CMP14+EGFR
ALT: 9 IU/L (ref 0–32)
AST: 13 IU/L (ref 0–40)
Albumin: 3.8 g/dL (ref 3.8–4.9)
Alkaline Phosphatase: 73 IU/L (ref 44–121)
BUN/Creatinine Ratio: 11 — ABNORMAL LOW (ref 12–28)
BUN: 10 mg/dL (ref 8–27)
Bilirubin Total: 0.2 mg/dL (ref 0.0–1.2)
CO2: 19 mmol/L — ABNORMAL LOW (ref 20–29)
Calcium: 9.3 mg/dL (ref 8.7–10.3)
Chloride: 104 mmol/L (ref 96–106)
Creatinine, Ser: 0.91 mg/dL (ref 0.57–1.00)
Globulin, Total: 2.9 g/dL (ref 1.5–4.5)
Glucose: 170 mg/dL — ABNORMAL HIGH (ref 70–99)
Potassium: 4.7 mmol/L (ref 3.5–5.2)
Sodium: 137 mmol/L (ref 134–144)
Total Protein: 6.7 g/dL (ref 6.0–8.5)
eGFR: 72 mL/min/{1.73_m2} (ref >59)

## 2023-07-08 ENCOUNTER — Other Ambulatory Visit (INDEPENDENT_AMBULATORY_CARE_PROVIDER_SITE_OTHER): Payer: Medicare Other

## 2023-07-08 DIAGNOSIS — G720 Drug-induced myopathy: Secondary | ICD-10-CM

## 2023-07-08 DIAGNOSIS — E782 Mixed hyperlipidemia: Secondary | ICD-10-CM

## 2023-07-08 NOTE — Progress Notes (Signed)
   07/08/2023 Name: [name] MRN: [id number] DOB: 11/19/1960  Chief Complaint  Patient presents with   Diabetes   Hyperlipidemia    [name] is a 63 y.o. year old female who presented for a telephone visit.  I connected with  [name] on 07/08/23 by telephone and verified that I am speaking with the correct person using two identifiers. I discussed the limitations of evaluation and management by telemedicine. The patient expressed understanding and agreed to proceed.  Patient was located in her home and PharmD in PCP office during this visit.   They were referred to the pharmacist by their PCP for assistance in managing hyperlipidemia.    Subjective:  Care Team: Primary Care Provider: Raliegh Ip, DO   Medication Access/Adherence  Current Pharmacy:  [facility] [place], [place] - [address] [place] [address] Phone: [phone] Fax: [phone]  [facility] - [place], [place] - [address] [place] [address] [place] [address] Phone: [phone] Fax: [phone]   Patient reports affordability concerns with their medications: Yes  Patient reports access/transportation concerns to their pharmacy: No  Patient reports adherence concerns with their medications:  No     Hyperlipidemia/ASCVD Risk Reduction  Current lipid lowering medications: repatha Medications tried in the past: atorva, prava, rosuvastatin  Antiplatelet regimen: ASA  Current physical activity: as able  Current medication access support: healthwell grant for repatha, breztri (az&me PAP)  Clinical ASCVD: No  The 10-year ASCVD risk score (Arnett DK, et al., 2019) is: 15.4%   Values used to calculate the score:     Age: 7 years     Sex: Female     Is Non-Hispanic African American: No     Diabetic: Yes     Tobacco smoker: Yes     Systolic Blood Pressure: 112 mmHg     Is BP treated: Yes     HDL Cholesterol:  37 mg/dL     Total Cholesterol: 159 mg/dL   Objective:  Lab Results  Component Value Date   HGBA1C 7.8 (H) 07/05/2023    Lab Results  Component Value Date   CREATININE 0.91 07/05/2023   BUN 10 07/05/2023   NA 137 07/05/2023   K 4.7 07/05/2023   CL 104 07/05/2023   CO2 19 (L) 07/05/2023    Lab Results  Component Value Date   CHOL 159 07/05/2023   HDL 37 (L) 07/05/2023   LDLCALC 93 07/05/2023   TRIG 169 (H) 07/05/2023   CHOLHDL 4.3 07/05/2023    Medications Reviewed Today   Medications were not reviewed in this encounter      Assessment/Plan:   Hyperlipidemia/ASCVD Risk Reduction: - Currently uncontrolled. -but improved - Reviewed long term complications of uncontrolled cholesterol - Reviewed dietary recommendations including FOLLOWING A HEART HEALTHY DIET/HEALTHY PLATE METHOD - Z56 coded for statin myopathy - Recommend to continue repatha  - Meets financial criteria for repatha patient assistance program through [employer].  Re-enrolled as below

## 2023-07-12 ENCOUNTER — Telehealth: Payer: Self-pay

## 2023-07-12 ENCOUNTER — Other Ambulatory Visit (HOSPITAL_COMMUNITY): Payer: Self-pay

## 2023-07-12 NOTE — Telephone Encounter (Signed)
Pharmacy Patient Advocate Encounter   Received notification from Fax that prior authorization for Repatha SureClick 140MG /ML auto-injectors is required/requested.   Insurance verification completed.   The patient is insured through [facility] .   Per test claim: PA required; PA submitted to above mentioned insurance via CoverMyMeds Key/confirmation #/EOC [id number] Status is pending

## 2023-07-12 NOTE — Telephone Encounter (Signed)
Pharmacy Patient Advocate Encounter  Received notification from [facility] that Prior Authorization for Repatha SureClick 140MG /ML auto-injectors  has been APPROVED from 07/12/23 to 01/09/24   PA #/Case ID/Reference #:  [id number].

## 2023-08-04 ENCOUNTER — Other Ambulatory Visit: Payer: Self-pay | Admitting: Family Medicine

## 2023-08-10 ENCOUNTER — Other Ambulatory Visit: Payer: Self-pay | Admitting: Family Medicine

## 2023-08-11 ENCOUNTER — Other Ambulatory Visit: Payer: Self-pay | Admitting: Family Medicine

## 2023-08-23 ENCOUNTER — Other Ambulatory Visit: Payer: Self-pay | Admitting: Family Medicine

## 2023-08-23 DIAGNOSIS — E1169 Type 2 diabetes mellitus with other specified complication: Secondary | ICD-10-CM

## 2023-09-07 ENCOUNTER — Other Ambulatory Visit: Payer: Self-pay | Admitting: Family Medicine

## 2023-09-07 DIAGNOSIS — F339 Major depressive disorder, recurrent, unspecified: Secondary | ICD-10-CM

## 2023-09-08 ENCOUNTER — Other Ambulatory Visit: Payer: Self-pay | Admitting: Family Medicine

## 2023-09-08 DIAGNOSIS — E1169 Type 2 diabetes mellitus with other specified complication: Secondary | ICD-10-CM

## 2023-09-10 ENCOUNTER — Telehealth: Payer: Self-pay | Admitting: Pharmacist

## 2023-09-10 NOTE — Telephone Encounter (Signed)
 [name]

## 2023-09-20 ENCOUNTER — Other Ambulatory Visit: Payer: Self-pay | Admitting: Family Medicine

## 2023-09-20 DIAGNOSIS — K219 Gastro-esophageal reflux disease without esophagitis: Secondary | ICD-10-CM

## 2023-09-28 ENCOUNTER — Other Ambulatory Visit: Payer: Self-pay | Admitting: Family Medicine

## 2023-09-28 DIAGNOSIS — E1122 Type 2 diabetes mellitus with diabetic chronic kidney disease: Secondary | ICD-10-CM

## 2023-09-29 ENCOUNTER — Ambulatory Visit: Payer: Medicare Other | Admitting: Family Medicine

## 2023-09-29 ENCOUNTER — Telehealth: Admitting: Family Medicine

## 2023-09-29 DIAGNOSIS — I739 Peripheral vascular disease, unspecified: Secondary | ICD-10-CM

## 2023-09-29 DIAGNOSIS — F339 Major depressive disorder, recurrent, unspecified: Secondary | ICD-10-CM | POA: Diagnosis not present

## 2023-09-29 DIAGNOSIS — E1151 Type 2 diabetes mellitus with diabetic peripheral angiopathy without gangrene: Secondary | ICD-10-CM | POA: Diagnosis not present

## 2023-09-29 DIAGNOSIS — F17200 Nicotine dependence, unspecified, uncomplicated: Secondary | ICD-10-CM | POA: Diagnosis not present

## 2023-09-29 DIAGNOSIS — Z794 Long term (current) use of insulin: Secondary | ICD-10-CM

## 2023-09-29 DIAGNOSIS — N189 Chronic kidney disease, unspecified: Secondary | ICD-10-CM | POA: Diagnosis not present

## 2023-09-29 DIAGNOSIS — E1122 Type 2 diabetes mellitus with diabetic chronic kidney disease: Secondary | ICD-10-CM | POA: Diagnosis not present

## 2023-09-29 DIAGNOSIS — Z7984 Long term (current) use of oral hypoglycemic drugs: Secondary | ICD-10-CM

## 2023-09-29 DIAGNOSIS — I129 Hypertensive chronic kidney disease with stage 1 through stage 4 chronic kidney disease, or unspecified chronic kidney disease: Secondary | ICD-10-CM

## 2023-09-29 DIAGNOSIS — E039 Hypothyroidism, unspecified: Secondary | ICD-10-CM | POA: Diagnosis not present

## 2023-09-29 MED ORDER — CLOPIDOGREL BISULFATE 75 MG PO TABS: 75.0000 mg | ORAL_TABLET | Freq: Every day | ORAL | 3 refills | Status: AC

## 2023-09-29 MED ORDER — ESCITALOPRAM OXALATE 10 MG PO TABS: 10.0000 mg | ORAL_TABLET | Freq: Every day | ORAL | 3 refills | Status: AC

## 2023-09-29 MED ORDER — NOVOFINE PLUS PEN NEEDLE 32G X 4 MM MISC
3 refills | Status: AC
Start: 2023-09-29 — End: ?

## 2023-09-29 MED ORDER — TRESIBA FLEXTOUCH 100 UNIT/ML ~~LOC~~ SOPN: 10.0000 [IU] | PEN_INJECTOR | Freq: Every day | SUBCUTANEOUS | 4 refills | Status: DC

## 2023-09-29 MED ORDER — LOSARTAN POTASSIUM 50 MG PO TABS
50.0000 mg | ORAL_TABLET | Freq: Every day | ORAL | 3 refills | Status: AC
Start: 2023-09-29 — End: ?

## 2023-09-29 MED ORDER — TRESIBA FLEXTOUCH 100 UNIT/ML ~~LOC~~ SOPN: 40.0000 [IU] | PEN_INJECTOR | Freq: Every day | SUBCUTANEOUS | 3 refills | Status: DC

## 2023-09-29 MED ORDER — LEVOTHYROXINE SODIUM 100 MCG PO TABS: 100.0000 ug | ORAL_TABLET | Freq: Every day | ORAL | 3 refills | Status: AC

## 2023-09-29 MED ORDER — METFORMIN HCL 1000 MG PO TABS: 1000.0000 mg | ORAL_TABLET | Freq: Every day | ORAL | 3 refills | Status: DC

## 2023-09-29 NOTE — Progress Notes (Signed)
 MyChart Video visit  Subjective: CC:DM PCP: Pam Guerin, DO [id number] [name] is a 63 y.o. female. Patient provides verbal consent for consult held via video.  Due to COVID-19 pandemic this visit was conducted virtually. This visit type was conducted due to national recommendations for restrictions regarding the COVID-19 Pandemic (e.g. social distancing, sheltering in place) in an effort to limit this patient's exposure and mitigate transmission in our community. All issues noted in this document were discussed and addressed.  A physical exam was not performed with this format.   Location of patient: home Location of provider: [facility] Others present for call: [name] her friend  1. Type 2 Diabetes with hypertension, hyperlipidemia:  Glucometer:Libre.   At last visit we switched her metformin  to extended release 1000 mg in efforts to improve her blood sugar as she had been missing her evening dose of metformin  immediate release.  She reports however since this change she has seen more blood sugars that are higher than normal.  She would like to resume 1000 mg immediate release if possible and is okay with transitioning over to Tresiba  again.  She was previously on 40 to 60 units daily.  She continues to monitor her diet.  She is compliant with all other medicines  Last eye exam: UTD Last foot exam: UTD Last A1c:  Lab Results  Component Value Date   HGBA1C 7.8 (H) 07/05/2023   Nephropathy screen indicated?: needs Last flu, zoster and/or pneumovax:  Immunization History  Administered Date(s) Administered   PFIZER(Purple Top)SARS-COV-2 Vaccination 01/25/2020, 02/15/2020   Td 06/08/2005    ROS: No chest pain, shortness of breath, visual disturbance reported  ROS: Per HPI  Allergies  Allergen Reactions   Aleve [Naproxen Sodium] Hives and Itching   Cortisone Swelling    Internal organs   Mounjaro [Tirzepatide]     Vomiting, severe abdominal pain, diarrhea   Ozempic   (0.25 Or 0.5 Mg-Dose) [Semaglutide (0.25 Or 0.5mg -Dos)] Diarrhea and Nausea Only   Prednisone Swelling    Makes internal organs swell   Statins Other (See Comments)    Myalgia even with Pravastatin    Vancomycin  Itching   Janumet  [Sitagliptin  Phos-Metformin  Hcl]     Nausea/diarrhea    Semaglutide  Diarrhea and Nausea And Vomiting    RYBELSUS --PATIENT CAN'T TOLERATE 3MG  DAILY   Past Medical History:  Diagnosis Date   Abscess 01/07/2021   Acute blood loss anemia    Arthritis    COPD (chronic obstructive pulmonary disease) (HCC)    Deep vein thrombosis (DVT) of both lower extremities (HCC) 01/05/2017   Dehiscence of amputation stump (HCC)    Depression    DM (diabetes mellitus) (HCC)    DVT (deep venous thrombosis) (HCC)    x5   Family history of colon cancer    GERD (gastroesophageal reflux disease)    Hyperlipemia    Hypertension    Hypothyroid    Malignant neoplasm of uterus (HCC)    Mild protein-calorie malnutrition (HCC)    Obesity    Osteomyelitis of great toe of right foot (HCC) 07/04/2018   PVD (peripheral vascular disease) (HCC)    S/P unilateral BKA (below knee amputation), right (HCC)    Type 2 diabetes mellitus with diabetic peripheral angiopathy and gangrene, without long-term current use of insulin  (HCC) 12/27/2018   Unilateral complete BKA, right, initial encounter (HCC) 07/11/2018   Unilateral complete BKA, right, subsequent encounter (HCC)    Uterine cancer (HCC)    UTI (urinary tract infection)    Wound  dehiscence 01/24/2021    Current Outpatient Medications:    Accu-Chek Softclix Lancets lancets, Use as instructed to test BGs up to 3 times daily E11.52, Disp: 300 each, Rfl: 3   ascorbic acid  (VITAMIN C) 500 MG tablet, Take 500 mg by mouth daily., Disp: , Rfl:    aspirin  EC 81 MG EC tablet, Take 1 tablet (81 mg total) by mouth daily., Disp: , Rfl:    Budeson-Glycopyrrol-Formoterol  (BREZTRI  AEROSPHERE) 160-9-4.8 MCG/ACT AERO, Inhale 2 puffs into the lungs 2  (two) times daily., Disp: 32.1 g, Rfl: 11   Cholecalciferol  (VITAMIN D3) 5000 units TABS, Take 5,000 Units by mouth daily., Disp: , Rfl:    clindamycin  (CLEOCIN  T) 1 % SWAB, Apply to affected areas twice daily., Disp: 180 each, Rfl: 3   clopidogrel  (PLAVIX ) 75 MG tablet, TAKE ONE TABLET ONCE DAILY, Disp: 90 tablet, Rfl: 0   Continuous Glucose Sensor (FREESTYLE LIBRE 3 PLUS SENSOR) MISC, CHECK BLOOD SUGAR AS DIRECTED. CHANGE EVERY 15 DAYS, Disp: 6 each, Rfl: 3   diclofenac  Sodium (VOLTAREN ) 1 % GEL, APPLY 2 GRAMS UP TO 4 TIMES A DAY AS NEEDED, Disp: 300 g, Rfl: 1   doxycycline  (VIBRA -TABS) 100 MG tablet, Take 1 tablet (100 mg total) by mouth 2 (two) times daily. 1 po bid, Disp: 20 tablet, Rfl: 0   escitalopram  (LEXAPRO ) 10 MG tablet, TAKE ONE TABLET ONCE DAILY, Disp: 60 tablet, Rfl: 0   furosemide  (LASIX ) 20 MG tablet, TAKE ONE TABLET DAILY, Disp: 90 tablet, Rfl: 0   glucose blood (ACCU-CHEK GUIDE) test strip, Check BS up to 3 times daily Dx E11.52, Disp: 300 strip, Rfl: 3   insulin  degludec (TRESIBA  FLEXTOUCH) 100 UNIT/ML FlexTouch Pen, Inject 40-60 Units into the skin at bedtime. (Patient not taking: Reported on 07/08/2023), Disp: 54 mL, Rfl: 3   Insulin  Pen Needle (NOVOFINE PLUS PEN NEEDLE) 32G X 4 MM MISC, UAD with tresiba  E11.9, Disp: 100 each, Rfl: 3   levocetirizine (XYZAL ) 5 MG tablet, Take 1 tablet (5 mg total) by mouth every evening. For allergies/ fluid in ears, Disp: 90 tablet, Rfl: 3   levothyroxine  (SYNTHROID ) 100 MCG tablet, TAKE ONE TABLET ONCE DAILY BEFORE BREAKFAST, Disp: 90 tablet, Rfl: 3   losartan  (COZAAR ) 50 MG tablet, TAKE ONE TABLET ONCE DAILY, Disp: 90 tablet, Rfl: 0   metFORMIN  (GLUCOPHAGE -XR) 500 MG 24 hr tablet, Take 2 tablets (1,000 mg total) by mouth daily with breakfast. To replace Immediate release, Disp: 180 tablet, Rfl: 3   pantoprazole  (PROTONIX ) 40 MG tablet, TAKE ONE TABLET ONCE DAILY, Disp: 60 tablet, Rfl: 0   Potassium 99 MG TABS, Take 99 mg by mouth in the  morning., Disp: , Rfl:    REPATHA  SURECLICK 140 MG/ML SOAJ, INJECT 140 MG (1 ML) ONCE EVERY 2 WEEKS, Disp: 2 mL, Rfl: 0   sennosides-docusate sodium  (SENOKOT-S) 8.6-50 MG tablet, Take 1 tablet by mouth in the morning., Disp: , Rfl:    tretinoin  (RETIN-A ) 0.05 % cream, APPLY TO AFFECTED AREAS AT BEDTIME, Disp: 45 g, Rfl: 3  Gen: well appearing female, NAD  Assessment/ Plan: 63 y.o. female   Type 2 diabetes mellitus with diabetic peripheral angiopathy without gangrene, without long-term current use of insulin  (HCC) - Plan: metFORMIN  (GLUCOPHAGE ) 1000 MG tablet, Insulin  Pen Needle (NOVOFINE PLUS PEN NEEDLE) 32G X 4 MM MISC, Bayer DCA Hb A1c Waived, Microalbumin / creatinine urine ratio, insulin  degludec (TRESIBA  FLEXTOUCH) 100 UNIT/ML FlexTouch Pen, DISCONTINUED: insulin  degludec (TRESIBA  FLEXTOUCH) 100 UNIT/ML FlexTouch Pen  Depression, recurrent (  HCC) - Plan: escitalopram  (LEXAPRO ) 10 MG tablet  Hypertension associated with chronic kidney disease due to type 2 diabetes mellitus (HCC) - Plan: losartan  (COZAAR ) 50 MG tablet  Peripheral vascular insufficiency (HCC) - Plan: clopidogrel  (PLAVIX ) 75 MG tablet  Acquired hypothyroidism - Plan: levothyroxine  (SYNTHROID ) 100 MCG tablet  Heavy tobacco smoker - Plan: Ambulatory Referral Lung Cancer Screening [facility]  Metformin  immediate release 1000 mg renewed.  Stop extended release.  Tresiba  10 units to 20 units at bedtime.  Discussed titrating up by 1 unit every 2 days for fasting blood sugars above 150.  Future orders for urine micro and A1c placed.  Lab appointment scheduled for Friday.  She will schedule her 43-month follow-up when she finishes her labs on Friday  We did not discuss depression today but needed refill on Lexapro   I also renewed her Plavix , Synthroid  that we did not discuss thyroid  nor peripheral vascular insufficiency at length today  She will continue all other medications as prescribed.  I have also placed referral for  lung cancer screening as she is a heavy tobacco smoker of 1 pack/day for over 50 years.  Clinically asymptomatic  Start time: 12:19p End time: 12:38p  Total time spent on patient care (including video visit/ documentation): 25 minutes  [name] Bambi Bonine, DO [facility] [phone]

## 2023-09-30 ENCOUNTER — Encounter: Payer: Self-pay | Admitting: Family Medicine

## 2023-10-01 ENCOUNTER — Encounter: Payer: Self-pay | Admitting: Family Medicine

## 2023-10-01 ENCOUNTER — Other Ambulatory Visit

## 2023-10-01 DIAGNOSIS — E1151 Type 2 diabetes mellitus with diabetic peripheral angiopathy without gangrene: Secondary | ICD-10-CM

## 2023-10-01 LAB — BAYER DCA HB A1C WAIVED: HB A1C (BAYER DCA - WAIVED): 8.5 % — ABNORMAL HIGH (ref 4.8–5.6)

## 2023-10-03 LAB — MICROALBUMIN / CREATININE URINE RATIO
Creatinine, Urine: 52.6 mg/dL
Microalb/Creat Ratio: 79 mg/g{creat} — ABNORMAL HIGH (ref 0–29)
Microalbumin, Urine: 41.4 ug/mL

## 2023-10-06 ENCOUNTER — Telehealth: Payer: Self-pay

## 2023-10-06 DIAGNOSIS — F17210 Nicotine dependence, cigarettes, uncomplicated: Secondary | ICD-10-CM

## 2023-10-06 DIAGNOSIS — Z122 Encounter for screening for malignant neoplasm of respiratory organs: Secondary | ICD-10-CM

## 2023-10-06 DIAGNOSIS — Z87891 Personal history of nicotine dependence: Secondary | ICD-10-CM

## 2023-10-06 NOTE — Telephone Encounter (Signed)
 Lung Cancer Screening Narrative/Criteria Questionnaire (Cigarette Smokers Only- No Cigars/Pipes/vapes)   [name]   SDMV:10/27/2023 at 10:45 am with [name]        1960-12-08   LDCT: 10/28/2023 at 1:30 at [facility]   63 y.o.   Phone: [phone]  Lung Screening Narrative (confirm age 32-77 yrs Medicare / 50-80 yrs Private pay insurance)   Insurance information: [employer]   Referring Provider: Bonnell Butcher   This screening involves an initial phone call with a team member from our program. It is called a shared decision making visit. The initial meeting is required by  insurance and Medicare to make sure you understand the program. This appointment takes about 15-20 minutes to complete. You will complete the screening scan at your scheduled date/time.  This scan takes about 5-10 minutes to complete. You can eat and drink normally before and after the scan.  Criteria questions for Lung Cancer Screening:   Are you a current or former smoker? Current Age began smoking: 14   If you are a former smoker, what year did you quit smoking?(within 15 yrs)   To calculate your smoking history, I need an accurate estimate of how many packs of cigarettes you smoked per day and for how many years. (Not just the number of PPD you are now smoking)   Years smoking 49 x Packs per day 1 = Pack years 49   (at least 20 pack yrs)   (Make sure they understand that we need to know how much they have smoked in the past, not just the number of PPD they are smoking now)  Do you have a personal history of cancer?  Yes - (type and when diagnosed - 5 yrs cancer free) Uterine Cancer 2011    Do you have a family history of cancer? Yes  (cancer type and and relative) Brother Colon Cancer  Are you coughing up blood?  No  Have you had unexplained weight loss of 15 lbs or more in the last 6 months? No  It looks like you meet all criteria.  When would be a good time for us  to schedule you for this screening?   Additional  information: N/A

## 2023-10-13 ENCOUNTER — Telehealth: Payer: Self-pay

## 2023-10-13 ENCOUNTER — Other Ambulatory Visit: Payer: Self-pay | Admitting: Family Medicine

## 2023-10-13 DIAGNOSIS — I739 Peripheral vascular disease, unspecified: Secondary | ICD-10-CM

## 2023-10-13 DIAGNOSIS — J418 Mixed simple and mucopurulent chronic bronchitis: Secondary | ICD-10-CM

## 2023-10-13 DIAGNOSIS — E1152 Type 2 diabetes mellitus with diabetic peripheral angiopathy with gangrene: Secondary | ICD-10-CM

## 2023-10-13 DIAGNOSIS — Z89511 Acquired absence of right leg below knee: Secondary | ICD-10-CM

## 2023-10-13 NOTE — Telephone Encounter (Signed)
 Patient informed. LS

## 2023-10-13 NOTE — Telephone Encounter (Signed)
 Copied from CRM [id number]. Topic: General - Other >> Oct 13, 2023  1:00 PM Felizardo Hotter wrote: Reason for CRM: Pt called stated her wheel chair broke and needs order for new wheel. Please call pt at [phone].

## 2023-10-13 NOTE — Telephone Encounter (Signed)
 Pt. Hasn't been seen in over a year. Needs office visit for any services

## 2023-10-13 NOTE — Telephone Encounter (Signed)
 I'm sorry I missed those. Scrip written. Printed.

## 2023-10-14 NOTE — Telephone Encounter (Signed)
 Copied from CRM [id number]. Topic: General - Other >> Oct 13, 2023  1:00 PM Felizardo Hotter wrote: Reason for CRM: Pt called stated her wheel chair broke and needs order for new wheel. Please call pt at [phone]. >> Oct 14, 2023 10:25 AM Lizabeth Riggs wrote: She needs an order sent to [facility] for a new 22 inch wheelchair. The last order was for just a wheel and she needs an entire wheelchair. Please have [facility] call Marvene Slipper to deliver wheelchair to her home. Her is [phone]. Jeannie refused to speak with a triage nurse about her fall.

## 2023-10-15 NOTE — Telephone Encounter (Signed)
 Pt called back states order should be for whole wheelchair, to go to company of [employer] not [facility]

## 2023-10-15 NOTE — Addendum Note (Signed)
 Addended by: Galvin Jules on: 10/15/2023 01:52 PM   Modules accepted: Orders

## 2023-10-15 NOTE — Telephone Encounter (Signed)
Order faxed to [facility] at [phone]

## 2023-10-15 NOTE — Telephone Encounter (Addendum)
 Pt's wheelchair broke in half and she needs order for a whole new wheelchair, this needs to be faxed to [facility], previous order was only for replacement wheel for wheelchair.

## 2023-10-16 DIAGNOSIS — Z89511 Acquired absence of right leg below knee: Secondary | ICD-10-CM | POA: Diagnosis not present

## 2023-10-18 ENCOUNTER — Other Ambulatory Visit: Payer: Self-pay | Admitting: Family Medicine

## 2023-10-18 DIAGNOSIS — E1169 Type 2 diabetes mellitus with other specified complication: Secondary | ICD-10-CM

## 2023-10-27 ENCOUNTER — Encounter: Payer: Self-pay | Admitting: Adult Health

## 2023-10-27 ENCOUNTER — Ambulatory Visit: Admitting: Adult Health

## 2023-10-27 DIAGNOSIS — F17210 Nicotine dependence, cigarettes, uncomplicated: Secondary | ICD-10-CM

## 2023-10-27 NOTE — Patient Instructions (Signed)

## 2023-10-27 NOTE — Progress Notes (Signed)
  Virtual Visit via Telephone Note  I connected with [name] , 10/27/23 10:57 AM by a telemedicine application and verified that I am speaking with the correct person using two identifiers.  Location: Patient: home Provider: home   I discussed the limitations of evaluation and management by telemedicine and the availability of in person appointments. The patient expressed understanding and agreed to proceed.   Shared Decision Making Visit Lung Cancer Screening Program [phone])   Eligibility: 63 y.o. Pack Years Smoking History Calculation = 49 pack years  (# packs/per year x # years smoked) Recent History of coughing up blood  no Unexplained weight loss? no ( >Than 15 pounds within the last 6 months ) Prior History Lung / other cancer - uterine cancer s/p TAH 2012 (Diagnosis within the last 5 years already requiring surveillance chest CT Scans). Smoking Status Current Smoker  Visit Components: Discussion included one or more decision making aids. YES Discussion included risk/benefits of screening. YES Discussion included potential follow up diagnostic testing for abnormal scans. YES Discussion included meaning and risk of over diagnosis. YES Discussion included meaning and risk of False Positives. YES Discussion included meaning of total radiation exposure. YES  Counseling Included: Importance of adherence to annual lung cancer LDCT screening. YES Impact of comorbidities on ability to participate in the program. YES Ability and willingness to under diagnostic treatment. YES  Smoking Cessation Counseling: Current Smokers:  Discussed importance of smoking cessation. yes Information about tobacco cessation classes and interventions provided to patient. yes Patient provided with "ticket" for LDCT Scan. yes Symptomatic Patient. NO Diagnosis Code: Tobacco Use Z72.0 Asymptomatic Patient yes  Counseling - 4 minutes of smoking cessation counseling (CT Chest Lung  Cancer Screening Low Dose W/O CM) [id number]  Z12.2-Screening of respiratory organs Z87.891-Personal history of nicotine  dependence   [name] 10/27/23

## 2023-10-28 ENCOUNTER — Ambulatory Visit (HOSPITAL_COMMUNITY)
Admission: RE | Admit: 2023-10-28 | Discharge: 2023-10-28 | Disposition: A | Discharge status: Home / Self Care | Source: Ambulatory Visit | Attending: Family Medicine | Admitting: Family Medicine

## 2023-10-28 DIAGNOSIS — F17210 Nicotine dependence, cigarettes, uncomplicated: Secondary | ICD-10-CM | POA: Diagnosis not present

## 2023-10-28 DIAGNOSIS — Z122 Encounter for screening for malignant neoplasm of respiratory organs: Secondary | ICD-10-CM | POA: Insufficient documentation

## 2023-10-28 DIAGNOSIS — Z87891 Personal history of nicotine dependence: Secondary | ICD-10-CM | POA: Diagnosis not present

## 2023-11-15 ENCOUNTER — Other Ambulatory Visit: Payer: Self-pay | Admitting: Family Medicine

## 2023-11-15 DIAGNOSIS — K219 Gastro-esophageal reflux disease without esophagitis: Secondary | ICD-10-CM

## 2023-11-16 DIAGNOSIS — Z89511 Acquired absence of right leg below knee: Secondary | ICD-10-CM | POA: Diagnosis not present

## 2023-11-18 ENCOUNTER — Telehealth: Payer: Self-pay | Admitting: Acute Care

## 2023-11-18 NOTE — Telephone Encounter (Signed)
 Left VM for patient to call for review of results for LDCT

## 2023-11-19 NOTE — Telephone Encounter (Signed)
 LVM to call office and review results.

## 2023-11-22 ENCOUNTER — Other Ambulatory Visit: Payer: Self-pay | Admitting: Family Medicine

## 2023-11-22 DIAGNOSIS — E1151 Type 2 diabetes mellitus with diabetic peripheral angiopathy without gangrene: Secondary | ICD-10-CM

## 2023-11-22 NOTE — Telephone Encounter (Signed)
 LVM and letter to mychart to review CT results.

## 2023-11-22 NOTE — Telephone Encounter (Unsigned)
 Copied from CRM [id number]. Topic: Clinical - Medication Refill >> Nov 22, 2023  3:57 PM Fredrica W wrote: Medication: insulin  degludec (TRESIBA  FLEXTOUCH) 100 UNIT/ML FlexTouch Pen - says dose was 52 units per day   Has the patient contacted their pharmacy? Yes pharmacy requested  (Agent: If no, request that the patient contact the pharmacy for the refill. If patient does not wish to contact the pharmacy document the reason why and proceed with request.) (Agent: If yes, when and what did the pharmacy advise?)  This is the patient's preferred pharmacy:  [facility] [place], [place] - [address] [place] [address] Phone: [phone] Fax: [phone]  Is this the correct pharmacy for this prescription? Yes If no, delete pharmacy and type the correct one.   Has the prescription been filled recently? No  Is the patient out of the medication? Yes  Has the patient been seen for an appointment in the last year OR does the patient have an upcoming appointment? Yes  Can we respond through MyChart? No  Agent: Please be advised that Rx refills may take up to 3 business days. We ask that you follow-up with your pharmacy.

## 2023-11-23 NOTE — Telephone Encounter (Signed)
 Letter mailed to home

## 2023-11-23 NOTE — Telephone Encounter (Signed)
LVM to review results.

## 2023-11-25 ENCOUNTER — Other Ambulatory Visit: Payer: Self-pay

## 2023-11-25 DIAGNOSIS — Z122 Encounter for screening for malignant neoplasm of respiratory organs: Secondary | ICD-10-CM

## 2023-11-25 DIAGNOSIS — F17210 Nicotine dependence, cigarettes, uncomplicated: Secondary | ICD-10-CM

## 2023-11-25 DIAGNOSIS — Z87891 Personal history of nicotine dependence: Secondary | ICD-10-CM

## 2023-11-25 DIAGNOSIS — R911 Solitary pulmonary nodule: Secondary | ICD-10-CM

## 2023-11-25 NOTE — Telephone Encounter (Signed)
 I have spoken with the patient and reviewed recent Lung CT results. She is in agreement to complete a 6 month f/u scan to check stability of a 7.3 mm nodule. She has been scheduled for 05/02/2024 at 1:00 pm at [facility]. Results and plan to PCP. Order placed.

## 2023-11-26 ENCOUNTER — Other Ambulatory Visit: Payer: Self-pay | Admitting: Family Medicine

## 2023-12-16 DIAGNOSIS — Z89511 Acquired absence of right leg below knee: Secondary | ICD-10-CM | POA: Diagnosis not present

## 2023-12-17 ENCOUNTER — Other Ambulatory Visit: Payer: Self-pay | Admitting: Family Medicine

## 2023-12-29 ENCOUNTER — Telehealth: Payer: Self-pay

## 2023-12-29 NOTE — Telephone Encounter (Signed)
 Insurance inquiry regarding patient not being on any statins. I informed that patient has statins listed in her allergy list with a reaction of myalgia.

## 2023-12-29 NOTE — Telephone Encounter (Signed)
 Copied from CRM [id number]. Topic: Clinical - Medication Question >> Dec 29, 2023  8:38 AM Carlatta H wrote: Reason for CRM: Please call Wanda with [employer] [phone] about statin//

## 2023-12-31 ENCOUNTER — Encounter: Payer: Self-pay | Admitting: Family Medicine

## 2023-12-31 ENCOUNTER — Ambulatory Visit: Admitting: Family Medicine

## 2023-12-31 VITALS — BP 122/79 | HR 66 | Temp 98.2°F | Ht 70.0 in | Wt 228.0 lb

## 2023-12-31 DIAGNOSIS — I152 Hypertension secondary to endocrine disorders: Secondary | ICD-10-CM | POA: Diagnosis not present

## 2023-12-31 DIAGNOSIS — E1151 Type 2 diabetes mellitus with diabetic peripheral angiopathy without gangrene: Secondary | ICD-10-CM | POA: Diagnosis not present

## 2023-12-31 DIAGNOSIS — E1121 Type 2 diabetes mellitus with diabetic nephropathy: Secondary | ICD-10-CM | POA: Diagnosis not present

## 2023-12-31 DIAGNOSIS — T466X5D Adverse effect of antihyperlipidemic and antiarteriosclerotic drugs, subsequent encounter: Secondary | ICD-10-CM

## 2023-12-31 DIAGNOSIS — E785 Hyperlipidemia, unspecified: Secondary | ICD-10-CM

## 2023-12-31 DIAGNOSIS — M609 Myositis, unspecified: Secondary | ICD-10-CM

## 2023-12-31 DIAGNOSIS — Z794 Long term (current) use of insulin: Secondary | ICD-10-CM

## 2023-12-31 DIAGNOSIS — E1169 Type 2 diabetes mellitus with other specified complication: Secondary | ICD-10-CM | POA: Diagnosis not present

## 2023-12-31 DIAGNOSIS — E1159 Type 2 diabetes mellitus with other circulatory complications: Secondary | ICD-10-CM | POA: Diagnosis not present

## 2023-12-31 LAB — BAYER DCA HB A1C WAIVED: HB A1C (BAYER DCA - WAIVED): 7.3 % — ABNORMAL HIGH (ref 4.8–5.6)

## 2023-12-31 IMAGING — MR MR HEAD W/O CM
11 of 12 series · 40 of 48 positions shown · non-contrast
Comparison: None Available.

CLINICAL DATA: Neuro deficit, acute, stroke suspected; right hand
droop

EXAM:
MRI HEAD WITHOUT CONTRAST
TECHNIQUE: Multiplanar, multiecho pulse sequences of the brain and surrounding
structures were obtained without intravenous contrast.

[Series 5: DWI · axial · 4.0mm · 0.88mm/px · z∈[-33,+104]mm · 4 of 36 slices shown (1 of 6)]
[im 1/36]
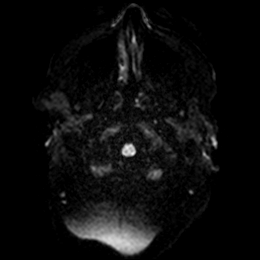
[im 12/36]
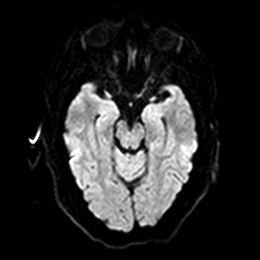
[im 24/36]
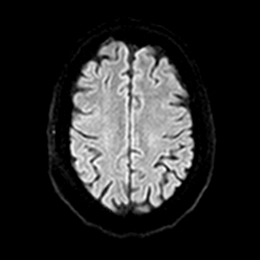
[im 36/36]
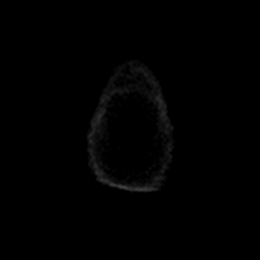

[Series 5: DWI · axial · 4.0mm · 0.88mm/px · z∈[-33,+104]mm · 4 of 36 slices shown (2 of 6)]
[im 1/36]
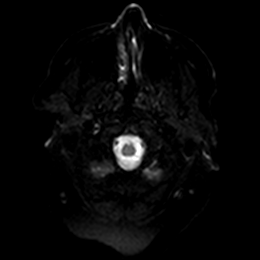
[im 12/36]
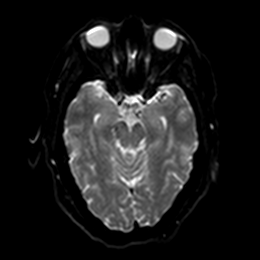
[im 24/36]
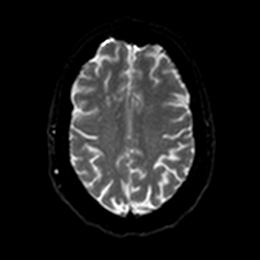
[im 36/36]
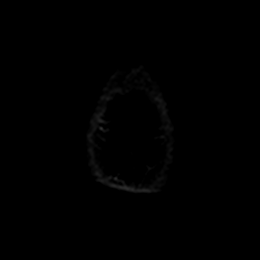

[Series 6: DWI · axial · 4.0mm · 0.88mm/px · z∈[-33,+104]mm · 4 of 36 slices shown (3 of 6)]
[im 1/36]
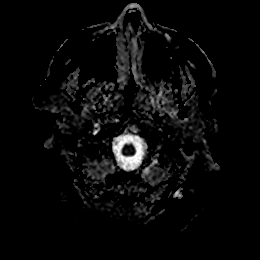
[im 12/36]
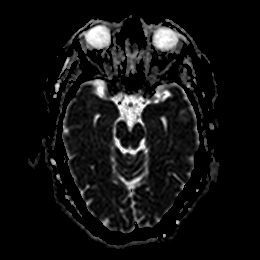
[im 24/36]
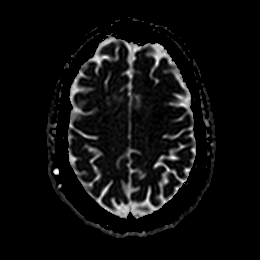
[im 36/36]
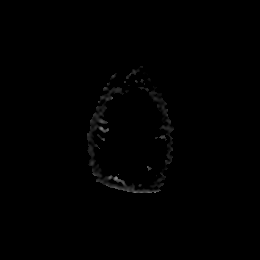

[Series 7: DWI · coronal · 5.0mm · 0.88mm/px · 3 of 28 slices shown (4 of 6)]
[im 1/28]
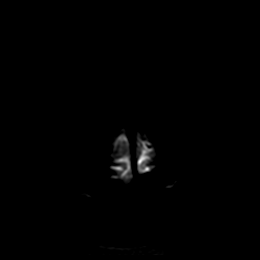
[im 14/28]
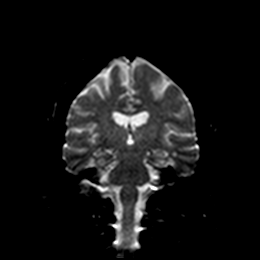
[im 28/28]
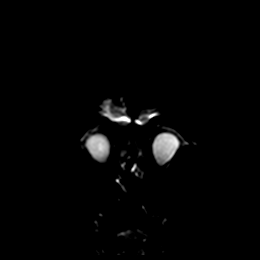

[Series 7: DWI · coronal · 5.0mm · 0.88mm/px · 4 of 28 slices shown (5 of 6)]
[im 1/28]
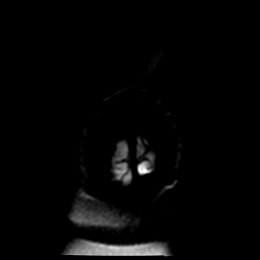
[im 10/28]
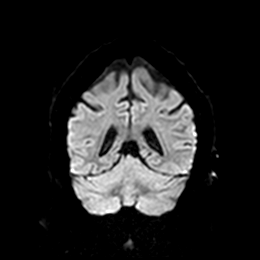
[im 19/28]
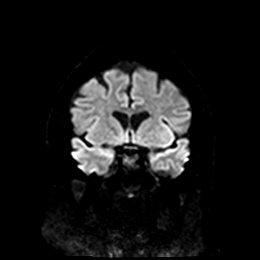
[im 28/28]
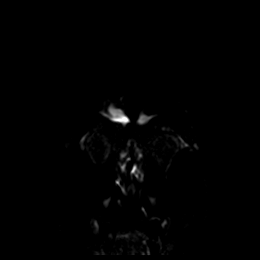

[Series 8: DWI · coronal · 5.0mm · 0.88mm/px · 4 of 28 slices shown (6 of 6)]
[im 1/28]
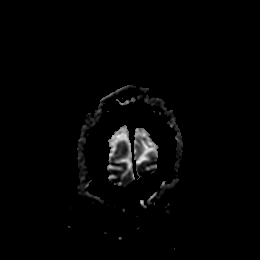
[im 10/28]
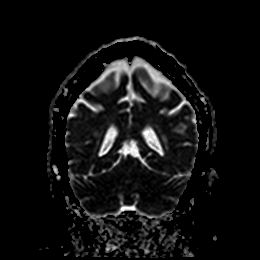
[im 19/28]
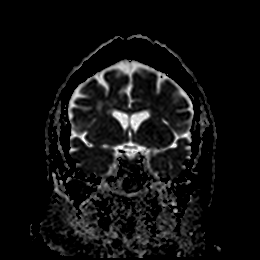
[im 28/28]
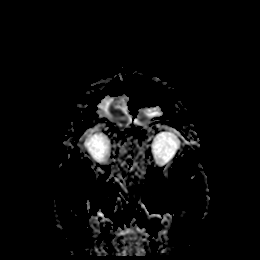

[Series 9: T1 · sagittal · 5.0mm · 0.94mm/px · 3 of 21 slices shown]
[im 1/21]
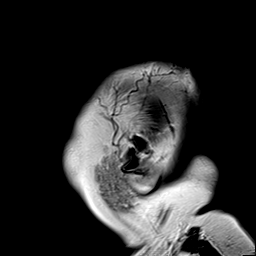
[im 11/21]
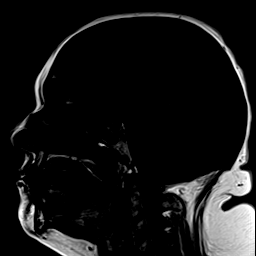
[im 21/21]
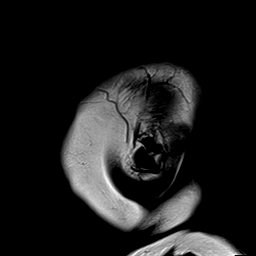

[Series 10: T2 · axial · 5.0mm · 0.72mm/px · z∈[-29,+100]mm · 3 of 20 slices shown (1 of 2)]
[im 1/20]
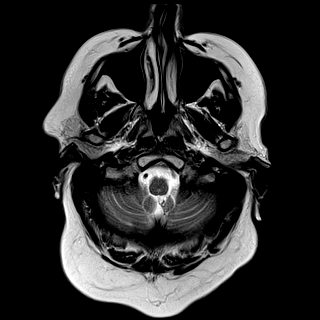
[im 10/20]
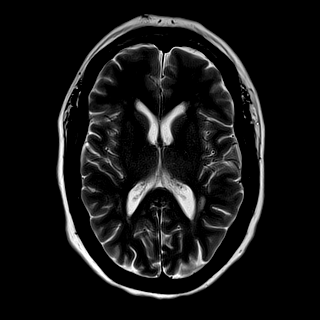
[im 20/20]
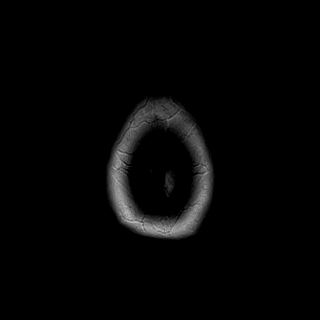

[Series 11: ax hemo · axial · 5.0mm · 0.86mm/px · z∈[-34,+106]mm · 3 of 25 slices shown]
[im 1/25]
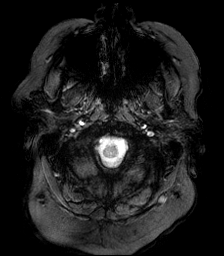
[im 13/25]
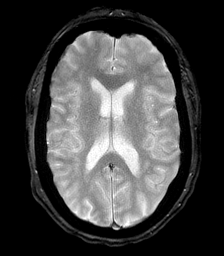
[im 25/25]
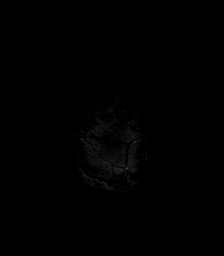

[Series 12: FLAIR · axial · 4.0mm · 0.43mm/px · z∈[-25,+96]mm · 4 of 32 slices shown]
[im 1/32]
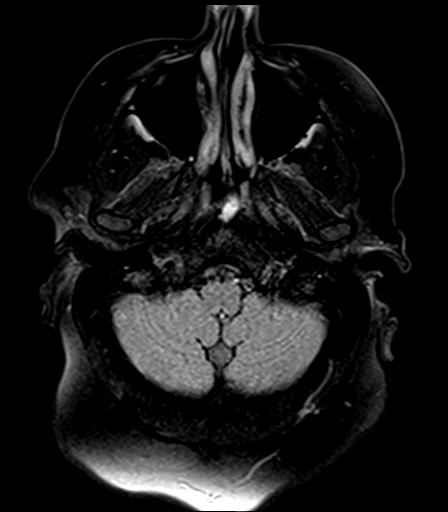
[im 11/32]
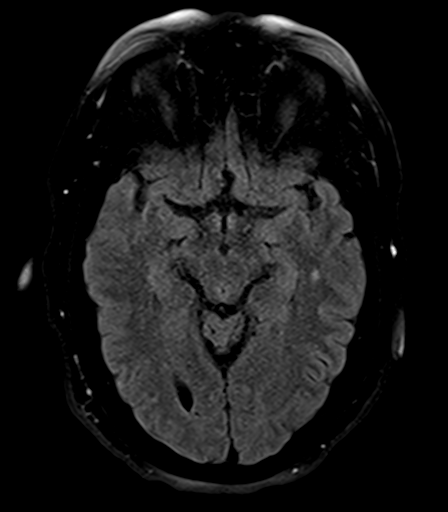
[im 21/32]
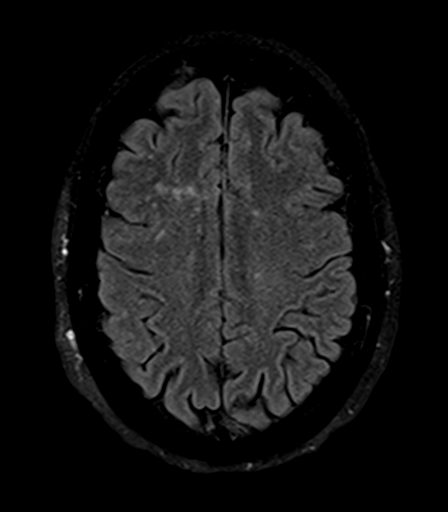
[im 32/32]
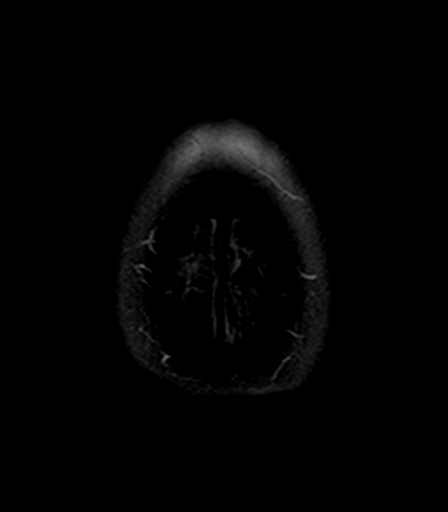

[Series 14: T2 · coronal · 5.0mm · 0.72mm/px · 4 of 28 slices shown (2 of 2)]
[im 1/28]
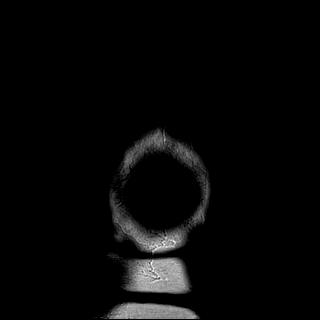
[im 10/28]
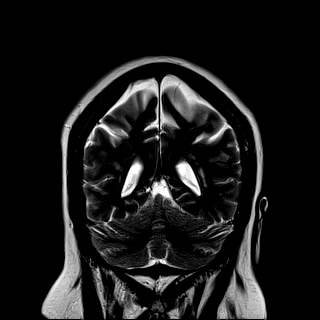
[im 19/28]
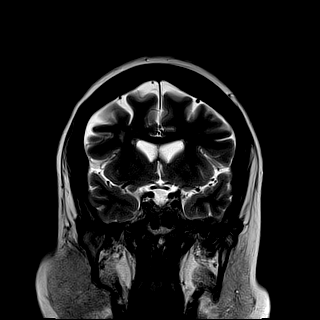
[im 28/28]
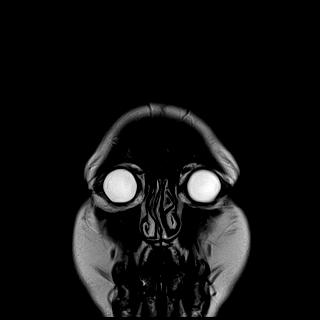

[40 of 48 positions shown; findings below may reference images not displayed]

FINDINGS: Brain: There is no acute infarction or intracranial hemorrhage.
There is no intracranial mass, mass effect, or edema. There is no
hydrocephalus or extra-axial fluid collection. Ventricles and sulci
are within normal limits in size and configuration. Patchy foci of
T2 hyperintensity in the supratentorial white matter are nonspecific
but may reflect mild chronic microvascular ischemic changes.

Vascular: Major vessel flow voids at the skull base are preserved.

Skull and upper cervical spine: Normal marrow signal is preserved.

Sinuses/Orbits: Paranasal sinuses are aerated. Orbits are
unremarkable.

Other: Sella is unremarkable.  Mastoid air cells are clear.
IMPRESSION: No evidence of recent infarction, hemorrhage, or mass.

Mild chronic microvascular ischemic changes.

## 2023-12-31 NOTE — Progress Notes (Signed)
 Subjective: CC:DM PCP: [name], DO [name] is a 63 y.o. female presenting to clinic today for:  1. Type 2 Diabetes with hypertension, hyperlipidemia, microalbuminuria, PAD:  Glucometer:Freestyle libre 3+.   Repatha  for cholesterol due to statin intolerance.  During her video visit we put her back on low-dose insulin  for uncontrolled blood sugars.  She takes losartan  for blood pressure control.  She brings today her freestyle libre which shows average blood sugar of 120-140s.  She has been doing really well.  She has had 1 hypoglycemic episode.  She is injecting 52 units of Tresiba  at bedtime and continues metformin  once daily.  Could not tolerate Repatha  secondary to injection site pain and bleeding.  She does not feel like she has enough meat inject this so she discontinued it.  She has been statin intolerant.  Diabetes Health Maintenance Due  Topic Date Due   OPHTHALMOLOGY EXAM  01/04/2024   HEMOGLOBIN A1C  04/01/2024   FOOT EXAM  06/27/2024    Last A1c:  Lab Results  Component Value Date   HGBA1C 8.5 (H) 10/01/2023    ROS: No chest pain, shortness of breath.  No visual disturbance.  She is getting some sores at the site of the libre patch   ROS: Per HPI  Allergies  Allergen Reactions   Aleve [Naproxen Sodium] Hives and Itching   Cortisone Swelling    Internal organs   Mounjaro [Tirzepatide]     Vomiting, severe abdominal pain, diarrhea   Ozempic  (0.25 Or 0.5 Mg-Dose) [Semaglutide (0.25 Or 0.5mg -Dos)] Diarrhea and Nausea Only   Prednisone Swelling    Makes internal organs swell   Statins Other (See Comments)    Myalgia even with Pravastatin    Vancomycin  Itching   Janumet  [Sitagliptin  Phos-Metformin  Hcl]     Nausea/diarrhea    Semaglutide  Diarrhea and Nausea And Vomiting    RYBELSUS --PATIENT CAN'T TOLERATE 3MG  DAILY   Past Medical History:  Diagnosis Date   Abscess 01/07/2021   Acute blood loss anemia    Arthritis    COPD  (chronic obstructive pulmonary disease) (HCC)    Deep vein thrombosis (DVT) of both lower extremities (HCC) 01/05/2017   Dehiscence of amputation stump (HCC)    Depression    DM (diabetes mellitus) (HCC)    DVT (deep venous thrombosis) (HCC)    x5   Family history of colon cancer    GERD (gastroesophageal reflux disease)    Hyperlipemia    Hypertension    Hypothyroid    Malignant neoplasm of uterus (HCC)    Mild protein-calorie malnutrition (HCC)    Obesity    Osteomyelitis of great toe of right foot (HCC) 07/04/2018   PVD (peripheral vascular disease) (HCC)    S/P unilateral BKA (below knee amputation), right (HCC)    Type 2 diabetes mellitus with diabetic peripheral angiopathy and gangrene, without long-term current use of insulin  (HCC) 12/27/2018   Unilateral complete BKA, right, initial encounter (HCC) 07/11/2018   Unilateral complete BKA, right, subsequent encounter (HCC)    Uterine cancer (HCC)    UTI (urinary tract infection)    Wound dehiscence 01/24/2021    Current Outpatient Medications:    Accu-Chek Softclix Lancets lancets, Use as instructed to test BGs up to 3 times daily E11.52, Disp: 300 each, Rfl: 3   aspirin  EC 81 MG EC tablet, Take 1 tablet (81 mg total) by mouth daily., Disp: , Rfl:    Cholecalciferol  (VITAMIN D3) 5000 units TABS, Take 5,000 Units by  mouth daily., Disp: , Rfl:    clindamycin  (CLEOCIN  T) 1 % SWAB, Apply to affected areas twice daily., Disp: 180 each, Rfl: 3   clopidogrel  (PLAVIX ) 75 MG tablet, Take 1 tablet (75 mg total) by mouth daily., Disp: 90 tablet, Rfl: 3   Continuous Glucose Sensor (FREESTYLE LIBRE 3 PLUS SENSOR) MISC, CHECK BLOOD SUGAR AS DIRECTED. CHANGE EVERY 15 DAYS, Disp: 6 each, Rfl: 3   diclofenac  Sodium (VOLTAREN ) 1 % GEL, APPLY 2 GRAMS UP TO 4 TIMES A DAY AS NEEDED, Disp: 300 g, Rfl: 1   escitalopram  (LEXAPRO ) 10 MG tablet, Take 1 tablet (10 mg total) by mouth daily., Disp: 90 tablet, Rfl: 3   Evolocumab  (REPATHA  SURECLICK) 140  MG/ML SOAJ, INJECT 140 MG (1 ML) ONCE EVERY 2 WEEKS, Disp: 2 mL, Rfl: 1   furosemide  (LASIX ) 20 MG tablet, TAKE ONE TABLET DAILY, Disp: 30 tablet, Rfl: 0   glucose blood (ACCU-CHEK GUIDE) test strip, Check BS up to 3 times daily Dx E11.52, Disp: 300 strip, Rfl: 3   insulin  degludec (TRESIBA  FLEXTOUCH) 100 UNIT/ML FlexTouch Pen, Inject 52 Units into the skin at bedtime., Disp: 45 mL, Rfl: 3   Insulin  Pen Needle (NOVOFINE PLUS PEN NEEDLE) 32G X 4 MM MISC, UAD with tresiba  E11.9, Disp: 100 each, Rfl: 3   levocetirizine (XYZAL ) 5 MG tablet, Take 1 tablet (5 mg total) by mouth every evening. For allergies/ fluid in ears, Disp: 90 tablet, Rfl: 3   levothyroxine  (SYNTHROID ) 100 MCG tablet, Take 1 tablet (100 mcg total) by mouth daily before breakfast., Disp: 90 tablet, Rfl: 3   losartan  (COZAAR ) 50 MG tablet, Take 1 tablet (50 mg total) by mouth daily., Disp: 90 tablet, Rfl: 3   metFORMIN  (GLUCOPHAGE ) 1000 MG tablet, Take 1 tablet (1,000 mg total) by mouth daily with breakfast. **formula change, Disp: 90 tablet, Rfl: 3   pantoprazole  (PROTONIX ) 40 MG tablet, TAKE ONE TABLET ONCE DAILY, Disp: 60 tablet, Rfl: 1   Potassium 99 MG TABS, Take 99 mg by mouth in the morning., Disp: , Rfl:    sennosides-docusate sodium  (SENOKOT-S) 8.6-50 MG tablet, Take 1 tablet by mouth in the morning., Disp: , Rfl:    tretinoin  (RETIN-A ) 0.05 % cream, APPLY TO AFFECTED AREAS AT BEDTIME, Disp: 45 g, Rfl: 3 Social History   Socioeconomic History   Marital status: Divorced    Spouse name: Archivist   Number of children: 1   Years of education: 16   Highest education level: Master's degree (e.g., MA, MS, MEng, MEd, MSW, MBA)  Occupational History   Occupation: Magazine features editor: [employer]   Occupation: retired  Tobacco Use   Smoking status: Every Day    Current packs/day: 1.00    Average packs/day: 1 pack/day for 38.0 years (38.0 ttl pk-yrs)    Types: Cigarettes   Smokeless tobacco: Never  Vaping Use   Vaping  status: Never Used  Substance and Sexual Activity   Alcohol use: No    Alcohol/week: 0.0 standard drinks of alcohol   Drug use: Yes    Frequency: 4.0 times per week    Types: Marijuana   Sexual activity: Not Currently    Birth control/protection: Surgical  Other Topics Concern   Not on file  Social History Narrative   Lives alone in one bedroom apartment - has a ramp outside - no stairs -handicap accessible bathroom   R BKA, but has prosthetic leg she uses to walk, drive, etc   Lots of family  nearby, great relationships - lots of help when needed   Social Drivers of Health   Financial Resource Strain: Medium Risk (09/29/2023)   Overall Financial Resource Strain (CARDIA)    Difficulty of Paying Living Expenses: Somewhat hard  Food Insecurity: No Food Insecurity (09/29/2023)   Hunger Vital Sign    Worried About Running Out of Food in the Last Year: Never true    Ran Out of Food in the Last Year: Never true  Transportation Needs: No Transportation Needs (09/29/2023)   PRAPARE - Administrator, Civil Service (Medical): No    Lack of Transportation (Non-Medical): No  Physical Activity: Inactive (09/29/2023)   Exercise Vital Sign    Days of Exercise per Week: 0 days    Minutes of Exercise per Session: 0 min  Stress: No Stress Concern Present (09/29/2023)   [employer] of Occupational Health - Occupational Stress Questionnaire    Feeling of Stress : Not at all  Social Connections: Moderately Integrated (09/29/2023)   Social Connection and Isolation Panel    Frequency of Communication with Friends and Family: More than three times a week    Frequency of Social Gatherings with Friends and Family: Once a week    Attends Religious Services: More than 4 times per year    Active Member of [employer] or Organizations: Yes    Attends Engineer, structural: More than 4 times per year    Marital Status: Divorced  Intimate Partner Violence: Not At Risk (06/21/2023)    Humiliation, Afraid, Rape, and Kick questionnaire    Fear of Current or Ex-Partner: No    Emotionally Abused: No    Physically Abused: No    Sexually Abused: No   Family History  Problem Relation Age of Onset   Hypertension Mother    Hypothyroidism Mother    Heart disease Father    Diabetes Father    Hyperlipidemia Father    Stomach cancer Brother    Colon cancer Brother 31   Clotting disorder Brother    Diabetes Brother    Liver disease Neg Hx    Esophageal cancer Neg Hx     Objective: Office vital signs reviewed. BP 122/79   Pulse 66   Temp 98.2 F (36.8 C)   Ht 5' 10 (1.778 m)   Wt 228 lb (103.4 kg)   SpO2 96%   BMI 32.71 kg/m   Physical Examination:  General: Awake, alert, well-appearing.  Smells of tobacco, No acute distress HEENT: Sclera white.  Moist mucous membranes Cardio: regular rate and rhythm, S1S2 heard, soft murmurs appreciated Pulm: clear to auscultation bilaterally, no wheezes, rhonchi or rales; normal work of breathing on room air MSK: Right lower extremity surgically absent below the knee   Assessment/ Plan: 63 y.o. female   Type 2 diabetes mellitus with diabetic peripheral angiopathy without gangrene, with long-term current use of insulin  (HCC) - Plan: Bayer DCA Hb A1c Waived  Hyperlipidemia associated with type 2 diabetes mellitus (HCC) - Plan: LDL Cholesterol, Direct, Hepatic function panel  Hypertension associated with diabetes (HCC)  Microalbuminuric diabetic nephropathy (HCC)  Statin-induced myositis  Sugar under much better control at 7.3 today.  Will keep insulin  regimen the same for now given episode of hypoglycemia that occurred recently.  Direct LDL and hepatic function panel collected.  She has statin induced myositis and sounds like she cannot tolerate Repatha  but I will CC Mliss see if there are any troubleshooting's that we might be able to do to  reduce her cardiovascular risk with this medicine  Blood pressure controlled.   No changes  Urine microalbumin positive.  Would strongly consider something like [place] or Jardiance but she does not want take any more medications at this time  Norene CHRISTELLA Fielding, DO [facility] [phone]

## 2024-01-01 LAB — HEPATIC FUNCTION PANEL
ALT: 7 IU/L (ref 0–32)
AST: 16 IU/L (ref 0–40)
Albumin: 3.5 g/dL — ABNORMAL LOW (ref 3.9–4.9)
Alkaline Phosphatase: 66 IU/L (ref 44–121)
Bilirubin Total: 0.2 mg/dL (ref 0.0–1.2)
Bilirubin, Direct: 0.09 mg/dL (ref 0.00–0.40)
Total Protein: 6.9 g/dL (ref 6.0–8.5)

## 2024-01-01 LAB — LDL CHOLESTEROL, DIRECT: LDL Direct: 166 mg/dL — ABNORMAL HIGH (ref 0–99)

## 2024-01-03 ENCOUNTER — Ambulatory Visit: Payer: Self-pay | Admitting: Family Medicine

## 2024-01-16 DIAGNOSIS — Z89511 Acquired absence of right leg below knee: Secondary | ICD-10-CM | POA: Diagnosis not present

## 2024-01-31 ENCOUNTER — Ambulatory Visit: Payer: Self-pay

## 2024-01-31 NOTE — Telephone Encounter (Signed)
 FYI Only or Action Required?: Action required by provider: refused ED recommendation.  Patient was last seen in primary care on 12/31/2023 by [name], DO.  Called Nurse Triage reporting Medication Problem.  Symptoms began several weeks ago.  Interventions attempted: Rest, hydration, or home remedies.  Symptoms are: gradually worsening.  Triage Disposition: Call PCP Now, See HCP Within 4 Hours (Or PCP Triage)  Patient/caregiver understands and will follow disposition?: No, refuses disposition  Copied from CRM #[id number]. Topic: Clinical - Red Word Triage >> Jan 31, 2024  4:26 PM Myrick T wrote: Red Word that prompted transfer to Nurse Triage: patient called stated she has a prosthetic leg but both legs around the thigh area has been hurting for 3 weeks and now she can't walk. She thinks its due to the insulin  medication Kinnie Reason for Disposition  [1] SEVERE pain (e.g., excruciating, unable to do any normal activities) AND [2] not improved after 2 hours of pain medicine  [1] Caller has URGENT medicine question about med that primary care doctor (or NP/PA) or specialist prescribed AND [2] triager unable to answer question  Answer Assessment - Initial Assessment Questions 1. NAME of MEDICINE: What medicine(s) are you calling about?     Tresiba  2. QUESTION: What is your question? (e.g., double dose of medicine, side effect)     Patient is having pain to her thighs to the point that she can't walk.  3. PRESCRIBER: Who prescribed the medicine? Reason: if prescribed by specialist, call should be referred to that group.     Gottschalk 4. SYMPTOMS: Do you have any symptoms? If Yes, ask: What symptoms are you having?  How bad are the symptoms (e.g., mild, moderate, severe)     Patient is having pain to her thighs where she is having severe pain when attempting to walk.  Answer Assessment - Initial Assessment Questions 1. ONSET: When did the pain start?      Started  about three weeks ago 2. LOCATION: Where is the pain located?      Outside thigh area 3. PAIN: How bad is the pain?    (Scale 1-10; or mild, moderate, severe)     10 4. WORK OR EXERCISE: Has there been any recent work or exercise that involved this part of the body?      no 5. CAUSE: What do you think is causing the leg pain?     Patient is concerned that Tresiba  might be causing the pain 6. OTHER SYMPTOMS: Do you have any other symptoms? (e.g., chest pain, back pain, breathing difficulty, swelling, rash, fever, numbness, weakness)     No  Patient reports she has been on the Tresiba  for the last eight months. Patient reports being unable to walk with her prosthetic due to pain  Protocols used: Medication Question Call-A-AH, Leg Pain-A-AH

## 2024-01-31 NOTE — Telephone Encounter (Signed)
 Patient claims bilateral thigh pain when standing.  She states it hurts so bad she cannot stand. She declined ED visit.  She is wanting an appointment to get referred to specialist that can help. Appointment made 09/02 with Dr. Jolinda at 3:00PM

## 2024-02-08 ENCOUNTER — Ambulatory Visit: Admitting: Family Medicine

## 2024-02-08 ENCOUNTER — Telehealth: Admitting: Family Medicine

## 2024-02-08 ENCOUNTER — Encounter: Payer: Self-pay | Admitting: Family Medicine

## 2024-02-08 ENCOUNTER — Other Ambulatory Visit: Payer: Self-pay | Admitting: Family Medicine

## 2024-02-08 DIAGNOSIS — Z794 Long term (current) use of insulin: Secondary | ICD-10-CM

## 2024-02-08 DIAGNOSIS — M79652 Pain in left thigh: Secondary | ICD-10-CM

## 2024-02-08 DIAGNOSIS — M79651 Pain in right thigh: Secondary | ICD-10-CM | POA: Diagnosis not present

## 2024-02-08 DIAGNOSIS — E1151 Type 2 diabetes mellitus with diabetic peripheral angiopathy without gangrene: Secondary | ICD-10-CM | POA: Diagnosis not present

## 2024-02-08 NOTE — Progress Notes (Signed)
 MyChart Video visit  Subjective: RR:uyphy pain PCP: Pam Norene HERO, DO YEP:Anwwpz [name] is a 63 y.o. female. Patient provides verbal consent for consult held via video.  Due to COVID-19 pandemic this visit was conducted virtually. This visit type was conducted due to national recommendations for restrictions regarding the COVID-19 Pandemic (e.g. social distancing, sheltering in place) in an effort to limit this patient's exposure and mitigate transmission in our community. All issues noted in this document were discussed and addressed.  A physical exam was not performed with this format.   Location of patient: home Location of provider: [facility] Others present for call: none  1. Thigh pain/DM Thigh pain started 8/18 and ended 8/31.  She is now able to walk fine. She stopped Tresiba  6 days ago and the pain resolved totally. BGs are running low 100s without insulin  and even into the 80s.  Avg 141.  She is watching portion sizes and that seems to control her BGs.  Compliant with Metformin  and all other meds.  ROS: Per HPI  Allergies  Allergen Reactions   Aleve [Naproxen Sodium] Hives and Itching   Cortisone Swelling    Internal organs   Mounjaro [Tirzepatide]     Vomiting, severe abdominal pain, diarrhea   Ozempic  (0.25 Or 0.5 Mg-Dose) [Semaglutide (0.25 Or 0.5mg -Dos)] Diarrhea and Nausea Only   Prednisone Swelling    Makes internal organs swell   Statins Other (See Comments)    Myalgia even with Pravastatin    Vancomycin  Itching   Janumet  [Sitagliptin  Phos-Metformin  Hcl]     Nausea/diarrhea    Semaglutide  Diarrhea and Nausea And Vomiting    RYBELSUS --PATIENT CAN'T TOLERATE 3MG  DAILY   Past Medical History:  Diagnosis Date   Abscess 01/07/2021   Acute blood loss anemia    Arthritis    COPD (chronic obstructive pulmonary disease) (HCC)    Deep vein thrombosis (DVT) of both lower extremities (HCC) 01/05/2017   Dehiscence of amputation stump (HCC)    Depression    DM  (diabetes mellitus) (HCC)    DVT (deep venous thrombosis) (HCC)    x5   Family history of colon cancer    GERD (gastroesophageal reflux disease)    Hyperlipemia    Hypertension    Hypothyroid    Malignant neoplasm of uterus (HCC)    Mild protein-calorie malnutrition (HCC)    Obesity    Osteomyelitis of great toe of right foot (HCC) 07/04/2018   PVD (peripheral vascular disease) (HCC)    S/P unilateral BKA (below knee amputation), right (HCC)    Type 2 diabetes mellitus with diabetic peripheral angiopathy and gangrene, without long-term current use of insulin  (HCC) 12/27/2018   Unilateral complete BKA, right, initial encounter (HCC) 07/11/2018   Unilateral complete BKA, right, subsequent encounter (HCC)    Uterine cancer (HCC)    UTI (urinary tract infection)    Wound dehiscence 01/24/2021    Current Outpatient Medications:    Accu-Chek Softclix Lancets lancets, Use as instructed to test BGs up to 3 times daily E11.52, Disp: 300 each, Rfl: 3   aspirin  EC 81 MG EC tablet, Take 1 tablet (81 mg total) by mouth daily., Disp: , Rfl:    Cholecalciferol  (VITAMIN D3) 5000 units TABS, Take 5,000 Units by mouth daily., Disp: , Rfl:    clindamycin  (CLEOCIN  T) 1 % SWAB, Apply to affected areas twice daily., Disp: 180 each, Rfl: 3   clopidogrel  (PLAVIX ) 75 MG tablet, Take 1 tablet (75 mg total) by mouth daily., Disp: 90  tablet, Rfl: 3   Continuous Glucose Sensor (FREESTYLE LIBRE 3 PLUS SENSOR) MISC, CHECK BLOOD SUGAR AS DIRECTED. CHANGE EVERY 15 DAYS, Disp: 6 each, Rfl: 3   diclofenac  Sodium (VOLTAREN ) 1 % GEL, APPLY 2 GRAMS UP TO 4 TIMES A DAY AS NEEDED, Disp: 300 g, Rfl: 1   escitalopram  (LEXAPRO ) 10 MG tablet, Take 1 tablet (10 mg total) by mouth daily., Disp: 90 tablet, Rfl: 3   Evolocumab  (REPATHA  SURECLICK) 140 MG/ML SOAJ, INJECT 140 MG (1 ML) ONCE EVERY 2 WEEKS, Disp: 2 mL, Rfl: 1   furosemide  (LASIX ) 20 MG tablet, TAKE ONE TABLET DAILY, Disp: 30 tablet, Rfl: 0   glucose blood (ACCU-CHEK  GUIDE) test strip, Check BS up to 3 times daily Dx E11.52, Disp: 300 strip, Rfl: 3   insulin  degludec (TRESIBA  FLEXTOUCH) 100 UNIT/ML FlexTouch Pen, Inject 52 Units into the skin at bedtime., Disp: 45 mL, Rfl: 3   Insulin  Pen Needle (NOVOFINE PLUS PEN NEEDLE) 32G X 4 MM MISC, UAD with tresiba  E11.9, Disp: 100 each, Rfl: 3   levocetirizine (XYZAL ) 5 MG tablet, Take 1 tablet (5 mg total) by mouth every evening. For allergies/ fluid in ears, Disp: 90 tablet, Rfl: 3   levothyroxine  (SYNTHROID ) 100 MCG tablet, Take 1 tablet (100 mcg total) by mouth daily before breakfast., Disp: 90 tablet, Rfl: 3   losartan  (COZAAR ) 50 MG tablet, Take 1 tablet (50 mg total) by mouth daily., Disp: 90 tablet, Rfl: 3   metFORMIN  (GLUCOPHAGE ) 1000 MG tablet, Take 1 tablet (1,000 mg total) by mouth daily with breakfast. **formula change, Disp: 90 tablet, Rfl: 3   pantoprazole  (PROTONIX ) 40 MG tablet, TAKE ONE TABLET ONCE DAILY, Disp: 60 tablet, Rfl: 1   Potassium 99 MG TABS, Take 99 mg by mouth in the morning., Disp: , Rfl:    sennosides-docusate sodium  (SENOKOT-S) 8.6-50 MG tablet, Take 1 tablet by mouth in the morning., Disp: , Rfl:    tretinoin  (RETIN-A ) 0.05 % cream, APPLY TO AFFECTED AREAS AT BEDTIME, Disp: 45 g, Rfl: 3  Gen: well appearing female, NAd  Assessment/ Plan: 63 y.o. female   Bilateral thigh pain  Type 2 diabetes mellitus with diabetic peripheral angiopathy without gangrene, with long-term current use of insulin  (HCC)  ?medication induced.  I will cc: clinical pharmacy, as I was not aware that myalgia could result from Tresiba .  Since BGs are controlled, ok to use Metformin  as monotherapy. Continue CGM monitoring. Fingerstick glucose for any low BGs.  Keep Oct appt for check up.  Start time: 2:55pm End time: 3:02pm  Total time spent on patient care (including video visit/ documentation): 10 minutes  Pam Nishida CHRISTELLA Fielding, DO [facility] [phone]

## 2024-02-21 ENCOUNTER — Other Ambulatory Visit: Payer: Self-pay | Admitting: Family Medicine

## 2024-02-21 DIAGNOSIS — K219 Gastro-esophageal reflux disease without esophagitis: Secondary | ICD-10-CM

## 2024-03-17 DIAGNOSIS — Z89511 Acquired absence of right leg below knee: Secondary | ICD-10-CM | POA: Diagnosis not present

## 2024-03-20 ENCOUNTER — Telehealth: Payer: Self-pay | Admitting: Family Medicine

## 2024-03-20 ENCOUNTER — Other Ambulatory Visit: Payer: Self-pay | Admitting: Family Medicine

## 2024-03-20 DIAGNOSIS — Z0279 Encounter for issue of other medical certificate: Secondary | ICD-10-CM

## 2024-03-20 NOTE — Telephone Encounter (Signed)
 pt dropped off disability and handicap forms to be completed and signed.  Form Fee Paid? (Y/N)       yes     If NO, form is placed on front office manager desk to hold until payment received. If YES, then form will be placed in the RX/HH Nurse Coordinators box for completion.  Form will not be processed until payment is received

## 2024-03-23 ENCOUNTER — Telehealth: Payer: Self-pay

## 2024-03-23 NOTE — Progress Notes (Signed)
 Care Guide Pharmacy Note  03/23/2024 Name: [name] MRN: [id number] DOB: 1961-06-06  Referred By: [name], DO Reason for referral: Complex Care Management (Outreach to schedule with pharm d for med assistance )   [name] is a 63 y.o. year old female who is a primary care patient of [name], DO.  [name] was referred to the pharmacist for assistance related to: HTN and HLD  An unsuccessful telephone outreach was attempted today to contact the patient who was referred to the pharmacy team for assistance with medication assistance. Additional attempts will be made to contact the patient.  Jeoffrey Buffalo , RMA     [facility]  [facility], [facility] Guide  Direct Dial: [phone]  Website: [email or web address]

## 2024-03-27 NOTE — Telephone Encounter (Signed)
 Aware ppw complete. Faxed to [employer] at [phone]. Pt picked up a copy, along w/ a completed handicap form

## 2024-04-03 ENCOUNTER — Telehealth: Admitting: Family Medicine

## 2024-04-03 ENCOUNTER — Ambulatory Visit: Admitting: Family Medicine

## 2024-04-03 ENCOUNTER — Encounter: Payer: Self-pay | Admitting: Family Medicine

## 2024-04-03 DIAGNOSIS — E1159 Type 2 diabetes mellitus with other circulatory complications: Secondary | ICD-10-CM

## 2024-04-03 DIAGNOSIS — T466X5A Adverse effect of antihyperlipidemic and antiarteriosclerotic drugs, initial encounter: Secondary | ICD-10-CM

## 2024-04-03 DIAGNOSIS — E1169 Type 2 diabetes mellitus with other specified complication: Secondary | ICD-10-CM

## 2024-04-03 DIAGNOSIS — E1151 Type 2 diabetes mellitus with diabetic peripheral angiopathy without gangrene: Secondary | ICD-10-CM | POA: Diagnosis not present

## 2024-04-03 DIAGNOSIS — Z780 Asymptomatic menopausal state: Secondary | ICD-10-CM

## 2024-04-03 DIAGNOSIS — T466X5D Adverse effect of antihyperlipidemic and antiarteriosclerotic drugs, subsequent encounter: Secondary | ICD-10-CM

## 2024-04-03 DIAGNOSIS — I152 Hypertension secondary to endocrine disorders: Secondary | ICD-10-CM

## 2024-04-03 DIAGNOSIS — M609 Myositis, unspecified: Secondary | ICD-10-CM | POA: Diagnosis not present

## 2024-04-03 DIAGNOSIS — E1121 Type 2 diabetes mellitus with diabetic nephropathy: Secondary | ICD-10-CM

## 2024-04-03 DIAGNOSIS — M7072 Other bursitis of hip, left hip: Secondary | ICD-10-CM

## 2024-04-03 DIAGNOSIS — E785 Hyperlipidemia, unspecified: Secondary | ICD-10-CM

## 2024-04-03 DIAGNOSIS — Z794 Long term (current) use of insulin: Secondary | ICD-10-CM

## 2024-04-03 NOTE — Progress Notes (Signed)
 MyChart Video visit  Subjective: CC:DM PCP: [name], DO [name] is a 63 y.o. female. Patient provides verbal consent for consult held via video.  Due to COVID-19 pandemic this visit was conducted virtually. This visit type was conducted due to national recommendations for restrictions regarding the COVID-19 Pandemic (e.g. social distancing, sheltering in place) in an effort to limit this patient's exposure and mitigate transmission in our community. All issues noted in this document were discussed and addressed.  A physical exam was not performed with this format.   Location of patient: home Location of provider: [facility] Others present for call: none  1. DM w/ HTN, HLD She reports BGs had been great until the last week.  She had been in green for 2 months. Avg BGs 143.  Taking Metformin  1000mg  daily.  Reports no chest pain, shortness of breath or dizziness.  Off Repatha  due to intolerance.  Compliant with antihypertensive  2.  Buttock pain She reports left-sided buttock pain after sitting in a steel chair for a couple of hours.  She ordered a donut pillow and hoping that it will help with the discomfort   ROS: Per HPI  Allergies  Allergen Reactions   Aleve [Naproxen Sodium] Hives and Itching   Cortisone Swelling    Internal organs   Mounjaro [Tirzepatide]     Vomiting, severe abdominal pain, diarrhea   Ozempic  (0.25 Or 0.5 Mg-Dose) [Semaglutide (0.25 Or 0.5mg -Dos)] Diarrhea and Nausea Only   Prednisone Swelling    Makes internal organs swell   Statins Other (See Comments)    Myalgia even with Pravastatin    Tresiba  [Insulin  Degludec] Other (See Comments)    myalgia   Vancomycin  Itching   Janumet  [Sitagliptin  Phos-Metformin  Hcl]     Nausea/diarrhea    Semaglutide  Diarrhea and Nausea And Vomiting    RYBELSUS --PATIENT CAN'T TOLERATE 3MG  DAILY   Past Medical History:  Diagnosis Date   Abscess 01/07/2021   Acute blood loss anemia    Arthritis     COPD (chronic obstructive pulmonary disease) (HCC)    Deep vein thrombosis (DVT) of both lower extremities (HCC) 01/05/2017   Dehiscence of amputation stump (HCC)    Depression    DM (diabetes mellitus) (HCC)    DVT (deep venous thrombosis) (HCC)    x5   Family history of colon cancer    GERD (gastroesophageal reflux disease)    Hyperlipemia    Hypertension    Hypothyroid    Malignant neoplasm of uterus (HCC)    Mild protein-calorie malnutrition    Obesity    Osteomyelitis of great toe of right foot (HCC) 07/04/2018   PVD (peripheral vascular disease)    S/P unilateral BKA (below knee amputation), right (HCC)    Type 2 diabetes mellitus with diabetic peripheral angiopathy and gangrene, without long-term current use of insulin  (HCC) 12/27/2018   Unilateral complete BKA, right, initial encounter (HCC) 07/11/2018   Unilateral complete BKA, right, subsequent encounter    Uterine cancer (HCC)    UTI (urinary tract infection)    Wound dehiscence 01/24/2021    Current Outpatient Medications:    Accu-Chek Softclix Lancets lancets, Use as instructed to test BGs up to 3 times daily E11.52, Disp: 300 each, Rfl: 3   aspirin  EC 81 MG EC tablet, Take 1 tablet (81 mg total) by mouth daily., Disp: , Rfl:    Cholecalciferol  (VITAMIN D3) 5000 units TABS, Take 5,000 Units by mouth daily., Disp: , Rfl:    clindamycin  (CLEOCIN  T)  1 % SWAB, Apply to affected areas twice daily., Disp: 180 each, Rfl: 3   clopidogrel  (PLAVIX ) 75 MG tablet, Take 1 tablet (75 mg total) by mouth daily., Disp: 90 tablet, Rfl: 3   Continuous Glucose Sensor (FREESTYLE LIBRE 3 PLUS SENSOR) MISC, CHECK BLOOD SUGAR AS DIRECTED. CHANGE EVERY 15 DAYS, Disp: 6 each, Rfl: 3   diclofenac  Sodium (VOLTAREN ) 1 % GEL, APPLY 2 GRAMS UP TO 4 TIMES A DAY AS NEEDED, Disp: 300 g, Rfl: 1   escitalopram  (LEXAPRO ) 10 MG tablet, Take 1 tablet (10 mg total) by mouth daily., Disp: 90 tablet, Rfl: 3   Evolocumab  (REPATHA  SURECLICK) 140 MG/ML SOAJ,  INJECT 140 MG (1 ML) ONCE EVERY 2 WEEKS, Disp: 2 mL, Rfl: 1   furosemide  (LASIX ) 20 MG tablet, TAKE ONE TABLET DAILY, Disp: 30 tablet, Rfl: 0   glucose blood (ACCU-CHEK GUIDE) test strip, Check BS up to 3 times daily Dx E11.52, Disp: 300 strip, Rfl: 3   Insulin  Pen Needle (NOVOFINE PLUS PEN NEEDLE) 32G X 4 MM MISC, UAD with tresiba  E11.9, Disp: 100 each, Rfl: 3   levocetirizine (XYZAL ) 5 MG tablet, Take 1 tablet (5 mg total) by mouth every evening. For allergies/ fluid in ears, Disp: 90 tablet, Rfl: 3   levothyroxine  (SYNTHROID ) 100 MCG tablet, Take 1 tablet (100 mcg total) by mouth daily before breakfast., Disp: 90 tablet, Rfl: 3   losartan  (COZAAR ) 50 MG tablet, Take 1 tablet (50 mg total) by mouth daily., Disp: 90 tablet, Rfl: 3   metFORMIN  (GLUCOPHAGE ) 1000 MG tablet, Take 1 tablet (1,000 mg total) by mouth daily with breakfast. **formula change, Disp: 90 tablet, Rfl: 3   pantoprazole  (PROTONIX ) 40 MG tablet, TAKE ONE TABLET ONCE DAILY, Disp: 60 tablet, Rfl: 1   Potassium 99 MG TABS, Take 99 mg by mouth in the morning., Disp: , Rfl:    sennosides-docusate sodium  (SENOKOT-S) 8.6-50 MG tablet, Take 1 tablet by mouth in the morning., Disp: , Rfl:    tretinoin  (RETIN-A ) 0.05 % cream, APPLY TO AFFECTED AREAS AT BEDTIME, Disp: 45 g, Rfl: 3  Gen: nontoxic female, NAD HEENT: sclera white, MMM Pulm: normal WOB on room air   Assessment/ Plan: 63 y.o. female   Type 2 diabetes mellitus with diabetic peripheral angiopathy without gangrene, with long-term current use of insulin  (HCC) - Plan: Bayer DCA Hb A1c Waived  Hyperlipidemia associated with type 2 diabetes mellitus (HCC)  Statin-induced myositis  Hypertension associated with diabetes (HCC)  Microalbuminuric diabetic nephropathy (HCC)  Asymptomatic postmenopausal estrogen deficiency - Plan: DG [facility] DEXA  Ischial bursitis of left side  She will come in for A1c.  She is up-to-date on other lab draws.  Continue CGM.  Continue metformin  at  current dose.  She will continue antihypertensives.  Reinforced need for diabetic eye exam  DEXA scan ordered.  She will schedule at her convenience  Sounds like she has ischial bursitis of left side.  We discussed use of donut pillow, to offset pressure.  Stretching recommended.  If needed can refer to orthopedics for corticosteroid injection  Start time: 12:58p End time: 1:06pm  Total time spent on patient care (including video visit/ documentation): 11 minutes  Deandra Gadson CHRISTELLA Fielding, DO [facility] [phone]

## 2024-04-11 ENCOUNTER — Encounter: Payer: Self-pay | Admitting: Family Medicine

## 2024-04-12 ENCOUNTER — Other Ambulatory Visit

## 2024-04-12 DIAGNOSIS — E1151 Type 2 diabetes mellitus with diabetic peripheral angiopathy without gangrene: Secondary | ICD-10-CM

## 2024-04-12 LAB — BAYER DCA HB A1C WAIVED: HB A1C (BAYER DCA - WAIVED): 7.3 % — ABNORMAL HIGH (ref 4.8–5.6)

## 2024-04-13 NOTE — Progress Notes (Signed)
 Complex Care Management Care Guide Note  04/13/2024 Name: Birdell Frasier MRN: [id number] DOB: 1961/02/06  Lily Velasquez is a 63 y.o. year old female who is a primary care patient of [name], DO and is actively engaged with the care management team. I reached out to Ebelyn Tuggle Ziglar by phone today to assist with scheduling  with the Pharmacist.  Follow up plan: Unsuccessful telephone outreach attempt made. A HIPAA compliant phone message was left for the patient providing contact information and requesting a return call.  Jeoffrey Buffalo , RMA     [facility]  [facility], [facility] Guide  Direct Dial: [phone]  Website: [email or web address]

## 2024-05-02 ENCOUNTER — Ambulatory Visit (HOSPITAL_COMMUNITY)
Admission: RE | Admit: 2024-05-02 | Discharge: 2024-05-02 | Disposition: A | Discharge status: Home / Self Care | Source: Ambulatory Visit | Attending: Family Medicine | Admitting: Family Medicine

## 2024-05-02 DIAGNOSIS — F17210 Nicotine dependence, cigarettes, uncomplicated: Secondary | ICD-10-CM | POA: Diagnosis present

## 2024-05-02 DIAGNOSIS — Z122 Encounter for screening for malignant neoplasm of respiratory organs: Secondary | ICD-10-CM | POA: Insufficient documentation

## 2024-05-02 DIAGNOSIS — R911 Solitary pulmonary nodule: Secondary | ICD-10-CM | POA: Diagnosis present

## 2024-05-02 DIAGNOSIS — Z87891 Personal history of nicotine dependence: Secondary | ICD-10-CM | POA: Insufficient documentation

## 2024-05-10 ENCOUNTER — Other Ambulatory Visit: Payer: Self-pay

## 2024-05-10 DIAGNOSIS — Z87891 Personal history of nicotine dependence: Secondary | ICD-10-CM

## 2024-05-10 DIAGNOSIS — F17210 Nicotine dependence, cigarettes, uncomplicated: Secondary | ICD-10-CM

## 2024-05-10 DIAGNOSIS — Z122 Encounter for screening for malignant neoplasm of respiratory organs: Secondary | ICD-10-CM

## 2024-05-15 ENCOUNTER — Encounter: Payer: Self-pay | Admitting: Family Medicine

## 2024-05-15 ENCOUNTER — Telehealth: Admitting: Family Medicine

## 2024-05-15 DIAGNOSIS — T8743 Infection of amputation stump, right lower extremity: Secondary | ICD-10-CM

## 2024-05-15 MED ORDER — DOXYCYCLINE HYCLATE 100 MG PO TABS: 100.0000 mg | ORAL_TABLET | Freq: Two times a day (BID) | ORAL | 0 refills | Status: AC

## 2024-05-15 NOTE — Progress Notes (Signed)
 MyChart Video visit  Subjective: CC:?  Infection in her stump PCP: [name], DO [name] is a 63 y.o. female. Patient provides verbal consent for consult held via video.  Due to COVID-19 pandemic this visit was conducted virtually. This visit type was conducted due to national recommendations for restrictions regarding the COVID-19 Pandemic (e.g. social distancing, sheltering in place) in an effort to limit this patient's exposure and mitigate transmission in our community. All issues noted in this document were discussed and addressed.  A physical exam was not performed with this format.   Location of patient: Home Location of provider: [facility] Others present for call: None  She reports that she went on vacation recently and did a lot of walking.  She subsequently developed a knot and soft tissue swelling at the distal end of her stump that is concerning for possible infection.  She does not report any fevers.  The knot seems to be a little bit better but she has persistent soft tissue swelling and tenderness.  She has an appointment with her orthopedist on Wednesday but wanted to go ahead and get started on oral antibiotics for fear of recurrent infection   ROS: Per HPI  Allergies  Allergen Reactions   Aleve [Naproxen Sodium] Hives and Itching   Cortisone Swelling    Internal organs   Mounjaro [Tirzepatide]     Vomiting, severe abdominal pain, diarrhea   Ozempic  (0.25 Or 0.5 Mg-Dose) [Semaglutide (0.25 Or 0.5mg -Dos)] Diarrhea and Nausea Only   Prednisone Swelling    Makes internal organs swell   Statins Other (See Comments)    Myalgia even with Pravastatin    Tresiba  [Insulin  Degludec] Other (See Comments)    myalgia   Vancomycin  Itching   Janumet  [Sitagliptin  Phos-Metformin  Hcl]     Nausea/diarrhea    Semaglutide  Diarrhea and Nausea And Vomiting    RYBELSUS --PATIENT CAN'T TOLERATE 3MG  DAILY   Past Medical History:  Diagnosis Date   Abscess 01/07/2021    Acute blood loss anemia    Arthritis    COPD (chronic obstructive pulmonary disease) (HCC)    Deep vein thrombosis (DVT) of both lower extremities (HCC) 01/05/2017   Dehiscence of amputation stump (HCC)    Depression    DM (diabetes mellitus) (HCC)    DVT (deep venous thrombosis) (HCC)    x5   Family history of colon cancer    GERD (gastroesophageal reflux disease)    Hyperlipemia    Hypertension    Hypothyroid    Malignant neoplasm of uterus (HCC)    Mild protein-calorie malnutrition    Obesity    Osteomyelitis of great toe of right foot (HCC) 07/04/2018   PVD (peripheral vascular disease)    S/P unilateral BKA (below knee amputation), right (HCC)    Type 2 diabetes mellitus with diabetic peripheral angiopathy and gangrene, without long-term current use of insulin  (HCC) 12/27/2018   Unilateral complete BKA, right, initial encounter (HCC) 07/11/2018   Unilateral complete BKA, right, subsequent encounter    Uterine cancer (HCC)    UTI (urinary tract infection)    Wound dehiscence 01/24/2021    Current Outpatient Medications:    Accu-Chek Softclix Lancets lancets, Use as instructed to test BGs up to 3 times daily E11.52, Disp: 300 each, Rfl: 3   aspirin  EC 81 MG EC tablet, Take 1 tablet (81 mg total) by mouth daily., Disp: , Rfl:    Cholecalciferol  (VITAMIN D3) 5000 units TABS, Take 5,000 Units by mouth daily., Disp: , Rfl:  clindamycin  (CLEOCIN  T) 1 % SWAB, Apply to affected areas twice daily., Disp: 180 each, Rfl: 3   clopidogrel  (PLAVIX ) 75 MG tablet, Take 1 tablet (75 mg total) by mouth daily., Disp: 90 tablet, Rfl: 3   Continuous Glucose Sensor (FREESTYLE LIBRE 3 PLUS SENSOR) MISC, CHECK BLOOD SUGAR AS DIRECTED. CHANGE EVERY 15 DAYS, Disp: 6 each, Rfl: 3   diclofenac  Sodium (VOLTAREN ) 1 % GEL, APPLY 2 GRAMS UP TO 4 TIMES A DAY AS NEEDED, Disp: 300 g, Rfl: 1   escitalopram  (LEXAPRO ) 10 MG tablet, Take 1 tablet (10 mg total) by mouth daily., Disp: 90 tablet, Rfl: 3    furosemide  (LASIX ) 20 MG tablet, TAKE ONE TABLET DAILY, Disp: 30 tablet, Rfl: 0   glucose blood (ACCU-CHEK GUIDE) test strip, Check BS up to 3 times daily Dx E11.52, Disp: 300 strip, Rfl: 3   Insulin  Pen Needle (NOVOFINE PLUS PEN NEEDLE) 32G X 4 MM MISC, UAD with tresiba  E11.9, Disp: 100 each, Rfl: 3   levocetirizine (XYZAL ) 5 MG tablet, Take 1 tablet (5 mg total) by mouth every evening. For allergies/ fluid in ears, Disp: 90 tablet, Rfl: 3   levothyroxine  (SYNTHROID ) 100 MCG tablet, Take 1 tablet (100 mcg total) by mouth daily before breakfast., Disp: 90 tablet, Rfl: 3   losartan  (COZAAR ) 50 MG tablet, Take 1 tablet (50 mg total) by mouth daily., Disp: 90 tablet, Rfl: 3   metFORMIN  (GLUCOPHAGE ) 1000 MG tablet, Take 1 tablet (1,000 mg total) by mouth daily with breakfast. **formula change, Disp: 90 tablet, Rfl: 3   pantoprazole  (PROTONIX ) 40 MG tablet, TAKE ONE TABLET ONCE DAILY, Disp: 60 tablet, Rfl: 1   Potassium 99 MG TABS, Take 99 mg by mouth in the morning., Disp: , Rfl:    sennosides-docusate sodium  (SENOKOT-S) 8.6-50 MG tablet, Take 1 tablet by mouth in the morning., Disp: , Rfl:    tretinoin  (RETIN-A ) 0.05 % cream, APPLY TO AFFECTED AREAS AT BEDTIME, Disp: 45 g, Rfl: 3  General: Well-appearing, nontoxic female  Assessment/ Plan: 63 y.o. female   Infection of amputation stump, right lower extremity (HCC) - Plan: doxycycline  (VIBRA -TABS) 100 MG tablet  Suspected based on recurrence in the past and soft tissue swelling but no overt erythema, warmth or drainage at this time.  Keep appointment with orthopedist on Wednesday.  Follow-up with me if any alternative recommendations are made   Start time: 11:12am End time: 11:17am  Total time spent on patient care (including video visit/ documentation): 8 minutes  Tomisha Reppucci CHRISTELLA Fielding, DO [facility] [phone]

## 2024-05-23 ENCOUNTER — Other Ambulatory Visit: Payer: Self-pay | Admitting: Family Medicine

## 2024-05-29 ENCOUNTER — Other Ambulatory Visit (HOSPITAL_COMMUNITY): Payer: Self-pay

## 2024-06-05 ENCOUNTER — Telehealth: Payer: Self-pay | Admitting: Pharmacy Technician

## 2024-06-05 ENCOUNTER — Other Ambulatory Visit (HOSPITAL_COMMUNITY): Payer: Self-pay

## 2024-06-05 NOTE — Telephone Encounter (Signed)
 Pharmacy Patient Advocate Encounter   Received notification from CoverMyMeds that prior authorization for FreeStyle Libre 3 Plus Sensor  is due for renewal.   Insurance verification completed.   The patient is insured through [facility] [employer].  Action: PA required; PA started via CoverMyMeds. KEY [id number] . Waiting for clinical questions to populate.

## 2024-06-06 ENCOUNTER — Other Ambulatory Visit (HOSPITAL_COMMUNITY): Payer: Self-pay

## 2024-06-06 NOTE — Telephone Encounter (Signed)
 Pharmacy Patient Advocate Encounter  Received notification from [facility] MEDICARE that Prior Authorization for FreeStyle Libre 3 Plus Sensor has been APPROVED from 06/08/24 to 06/07/25. Unable to obtain price due to refill too soon rejection, last fill date 05/25/24 next available fill date01/10/26   PA #/Case ID/Reference #: [id number]

## 2024-06-21 ENCOUNTER — Ambulatory Visit: Payer: Medicare Other

## 2024-07-04 ENCOUNTER — Ambulatory Visit: Payer: Self-pay

## 2024-07-04 NOTE — Telephone Encounter (Signed)
 FYI Only or Action Required?: Action required by provider: update on patient condition and patient requesting 1/28 appt be changed to virtual for both wellness and acute care needs (acute symptoms) due to weather.  Patient also voices need for bone density scan reschedule.   Patient was last seen in primary care on 05/15/2024 by [name], DO.  Called Nurse Triage reporting Sinusitis and Headache.  Symptoms began several weeks ago.  Interventions attempted: OTC medications: Ibuprofen.  Symptoms are: stable.  Triage Disposition: See Physician Within 24 Hours  Patient/caregiver understands and will follow disposition?: Yes   Triage RN notified CAL of request to modify appointment to virtual visit.  Will make provider aware in AM and office will call patient back in AM.    Message from [place] S sent at 07/04/2024  2:11 PM EST  Reason for Triage: Right side of nose sore, mild headache.  Would like virtual appt for tomorrow       Reason for Disposition  Earache  Answer Assessment - Initial Assessment Questions 1. LOCATION: Where does it hurt?      Pain above right eye- 3/10, comes/ goes  2. ONSET: When did the sinus pain start?  (e.g., hours, days)      About 3 weeks now, can't get nose to clear   3. SEVERITY: How bad is the pain?   (Scale 0-10; or none, mild, moderate or severe)     3/10, Ibuprofen helping  4. RECURRENT SYMPTOM: Have you ever had sinus problems before? If Yes, ask: When was the last time? and What happened that time?      Past sinus infection, this doesn't really feel like infection  5. NASAL CONGESTION: Is the nose blocked? If Yes, ask: Can you open it or must you breathe through your mouth?     Yes  6. NASAL DISCHARGE: Do you have discharge from your nose? If so ask, What color?     Yellow  7. FEVER: Do you have a fever? If Yes, ask: What is it, how was it measured, and when did it start?      Denies fever or  chills  8. OTHER SYMPTOMS: Do you have any other symptoms? (e.g., sore throat, cough, earache, difficulty breathing)    R) side Earache down neck, no other listed symptoms, no stiffness in neck  History of really bad sinus infection that took me down, this is not this way, feels head stopped up but not like a bad infection.  History of swimmer's ear and ear infections periodically requiring an antibiotic.   9. EXPOSURES: Has lived in current home for 5 years.  No known water leaks, flooding or humidity issues in this home or a past home/ workplace.  Does not monitor humidity level.  HVAC is electric.  Runs an air purifier all the time. No visible mold growth or dampness concerns.  Protocols used: Sinus Pain or Congestion-A-AH

## 2024-07-05 ENCOUNTER — Telehealth: Admitting: Family Medicine

## 2024-07-05 ENCOUNTER — Encounter: Payer: Self-pay | Admitting: Family Medicine

## 2024-07-05 ENCOUNTER — Ambulatory Visit

## 2024-07-05 DIAGNOSIS — E1159 Type 2 diabetes mellitus with other circulatory complications: Secondary | ICD-10-CM | POA: Diagnosis not present

## 2024-07-05 DIAGNOSIS — Z7984 Long term (current) use of oral hypoglycemic drugs: Secondary | ICD-10-CM

## 2024-07-05 DIAGNOSIS — E1121 Type 2 diabetes mellitus with diabetic nephropathy: Secondary | ICD-10-CM

## 2024-07-05 DIAGNOSIS — Z794 Long term (current) use of insulin: Secondary | ICD-10-CM

## 2024-07-05 DIAGNOSIS — J329 Chronic sinusitis, unspecified: Secondary | ICD-10-CM

## 2024-07-05 DIAGNOSIS — T466X5D Adverse effect of antihyperlipidemic and antiarteriosclerotic drugs, subsequent encounter: Secondary | ICD-10-CM | POA: Diagnosis not present

## 2024-07-05 DIAGNOSIS — I152 Hypertension secondary to endocrine disorders: Secondary | ICD-10-CM | POA: Diagnosis not present

## 2024-07-05 DIAGNOSIS — E039 Hypothyroidism, unspecified: Secondary | ICD-10-CM | POA: Diagnosis not present

## 2024-07-05 DIAGNOSIS — M609 Myositis, unspecified: Secondary | ICD-10-CM | POA: Diagnosis not present

## 2024-07-05 DIAGNOSIS — T466X5A Adverse effect of antihyperlipidemic and antiarteriosclerotic drugs, initial encounter: Secondary | ICD-10-CM

## 2024-07-05 DIAGNOSIS — E1151 Type 2 diabetes mellitus with diabetic peripheral angiopathy without gangrene: Secondary | ICD-10-CM

## 2024-07-05 DIAGNOSIS — E785 Hyperlipidemia, unspecified: Secondary | ICD-10-CM | POA: Diagnosis not present

## 2024-07-05 DIAGNOSIS — E1169 Type 2 diabetes mellitus with other specified complication: Secondary | ICD-10-CM | POA: Diagnosis not present

## 2024-07-05 MED ORDER — LEVOCETIRIZINE DIHYDROCHLORIDE 5 MG PO TABS: 5.0000 mg | ORAL_TABLET | Freq: Every evening | ORAL | 3 refills | Status: AC

## 2024-07-05 MED ORDER — AMOXICILLIN-POT CLAVULANATE 875-125 MG PO TABS: 1.0000 | ORAL_TABLET | Freq: Two times a day (BID) | ORAL | 0 refills | Status: AC

## 2024-07-05 MED ORDER — FLUCONAZOLE 150 MG PO TABS: 150.0000 mg | ORAL_TABLET | Freq: Once | ORAL | 0 refills | Status: AC

## 2024-07-05 MED ORDER — METFORMIN HCL 1000 MG PO TABS: 1000.0000 mg | ORAL_TABLET | Freq: Two times a day (BID) | ORAL | 3 refills | Status: AC

## 2024-07-05 NOTE — Telephone Encounter (Signed)
 Yes, absolutely.

## 2024-07-05 NOTE — Progress Notes (Signed)
 MyChart Video visit  Subjective: CC: Follow-up PCP: Pam Norene HERO, DO [name] is a 64 y.o. female. Patient provides verbal consent for consult held via video.  Due to COVID-19 pandemic this visit was conducted virtually. This visit type was conducted due to national recommendations for restrictions regarding the COVID-19 Pandemic (e.g. social distancing, sheltering in place) in an effort to limit this patient's exposure and mitigate transmission in our community. All issues noted in this document were discussed and addressed.  A physical exam was not performed with this format.   Location of patient: home Location of provider: [facility] Others present for call: none  Patient changed her visit to a video visit due to ice and inability to ambulate safely with her prosthetic.  She notes that things have been doing okay.  No blood sugars over 250.  She tries to watch her diet very closely and utilizes freestyle libre for continuous blood sugar monitoring.  No hypoglycemia.  Taking metformin  1000 mg daily.  However, sometimes she still wakes up with blood sugars above 200 despite going to bed with blood sugars in 150s.  She does not reporting chest pain, shortness of breath, dizziness.  She does report rhinosinusitis that is been ongoing for about 3 weeks and seems to be getting worse.  She describes pressure over the nasal bridge that causes sinus headache and radiates to the right eye.  She notes that she has been utilizing Motrin at bedtime in efforts to alleviate some of the discomfort but is not taking any other medicines.  No reports of fevers, chills, falls, hemoptysis   ROS: Per HPI  Allergies[1] Past Medical History:  Diagnosis Date   Abscess 01/07/2021   Acute blood loss anemia    Arthritis    COPD (chronic obstructive pulmonary disease) (HCC)    Deep vein thrombosis (DVT) of both lower extremities (HCC) 01/05/2017   Dehiscence of amputation stump (HCC)     Depression    DM (diabetes mellitus) (HCC)    DVT (deep venous thrombosis) (HCC)    x5   Family history of colon cancer    GERD (gastroesophageal reflux disease)    Hyperlipemia    Hypertension    Hypothyroid    Malignant neoplasm of uterus (HCC)    Mild protein-calorie malnutrition    Obesity    Osteomyelitis of great toe of right foot (HCC) 07/04/2018   PVD (peripheral vascular disease)    S/P unilateral BKA (below knee amputation), right (HCC)    Type 2 diabetes mellitus with diabetic peripheral angiopathy and gangrene, without long-term current use of insulin  (HCC) 12/27/2018   Unilateral complete BKA, right, initial encounter (HCC) 07/11/2018   Unilateral complete BKA, right, subsequent encounter    Uterine cancer (HCC)    UTI (urinary tract infection)    Wound dehiscence 01/24/2021   Current Medications[2]  Gen: Nontoxic-appearing female HEENT: Sclera white.  Moist mucous membranes Pulm: Normal work of breathing on room air Psych: Mood stable, speech normal.  Affect appropriate.  Pleasant, interactive  Assessment/ Plan: 64 y.o. female   Rhinosinusitis - Plan: levocetirizine (XYZAL ) 5 MG tablet, amoxicillin -clavulanate (AUGMENTIN ) 875-125 MG tablet, fluconazole  (DIFLUCAN ) 150 MG tablet  Type 2 diabetes mellitus with diabetic peripheral angiopathy without gangrene, with long-term current use of insulin  (HCC) - Plan: metFORMIN  (GLUCOPHAGE ) 1000 MG tablet, Bayer DCA Hb A1c Waived, CMP14+EGFR, Vitamin D , 25-hydroxy, Microalbumin / creatinine urine ratio, CBC with Differential/Platelet  Hyperlipidemia associated with type 2 diabetes mellitus (HCC) - Plan: Lipid panel,  CMP14+EGFR  Statin-induced myositis - Plan: Lipid panel, CMP14+EGFR  Hypertension associated with diabetes (HCC) - Plan: CMP14+EGFR  Microalbuminuric diabetic nephropathy (HCC) - Plan: CMP14+EGFR, Vitamin D , 25-hydroxy  Acquired hypothyroidism - Plan: CMP14+EGFR, TSH + free T4  For her rhinosinusitis I  presume that is a bacterial based on her discharge and duration of symptoms.  Augmentin  sent.  Diflucan  sent for as needed use.  I renewed her Xyzal .  She will come in for fasting labs at some point.  Urine microalbumin added.  We need to update her diabetic eye and foot exam at some point.  She is also overdue for bone density scan  She has sadly been statin intolerant as well as Repatha  intolerant.  She understands the risk of uncontrolled cholesterol.  Check thyroid  levels though clinically euthyroid  Start time: 11:33am End time: 11:42am  Total time spent on patient care (including video visit/ documentation): 14 minutes  Pam Don M Brenn Gatton, DO [facility] [phone]      [1]  Allergies Allergen Reactions   Aleve [Naproxen Sodium] Hives and Itching   Cortisone Swelling    Internal organs   Mounjaro [Tirzepatide]     Vomiting, severe abdominal pain, diarrhea   Ozempic  (0.25 Or 0.5 Mg-Dose) [Semaglutide (0.25 Or 0.5mg -Dos)] Diarrhea and Nausea Only   Prednisone Swelling    Makes internal organs swell   Statins Other (See Comments)    Myalgia even with Pravastatin    Tresiba  [Insulin  Degludec] Other (See Comments)    myalgia   Vancomycin  Itching   Janumet  [Sitagliptin  Phos-Metformin  Hcl]     Nausea/diarrhea    Semaglutide  Diarrhea and Nausea And Vomiting    RYBELSUS --PATIENT CAN'T TOLERATE 3MG  DAILY  [2]  Current Outpatient Medications:    Accu-Chek Softclix Lancets lancets, Use as instructed to test BGs up to 3 times daily E11.52, Disp: 300 each, Rfl: 3   aspirin  EC 81 MG EC tablet, Take 1 tablet (81 mg total) by mouth daily., Disp: , Rfl:    Cholecalciferol  (VITAMIN D3) 5000 units TABS, Take 5,000 Units by mouth daily., Disp: , Rfl:    clindamycin  (CLEOCIN  T) 1 % SWAB, Apply to affected areas twice daily., Disp: 180 each, Rfl: 3   clopidogrel  (PLAVIX ) 75 MG tablet, Take 1 tablet (75 mg total) by mouth daily., Disp: 90 tablet, Rfl: 3    Continuous Glucose Sensor (FREESTYLE LIBRE 3 PLUS SENSOR) MISC, CHECK BLOOD SUGAR AS DIRECTED. CHANGE EVERY 15 DAYS, Disp: 6 each, Rfl: 3   diclofenac  Sodium (VOLTAREN ) 1 % GEL, APPLY 2 GRAMS UP TO 4 TIMES A DAY AS NEEDED, Disp: 300 g, Rfl: 1   escitalopram  (LEXAPRO ) 10 MG tablet, Take 1 tablet (10 mg total) by mouth daily., Disp: 90 tablet, Rfl: 3   furosemide  (LASIX ) 20 MG tablet, TAKE ONE TABLET DAILY, Disp: 30 tablet, Rfl: 1   glucose blood (ACCU-CHEK GUIDE) test strip, Check BS up to 3 times daily Dx E11.52, Disp: 300 strip, Rfl: 3   Insulin  Pen Needle (NOVOFINE PLUS PEN NEEDLE) 32G X 4 MM MISC, UAD with tresiba  E11.9, Disp: 100 each, Rfl: 3   levocetirizine (XYZAL ) 5 MG tablet, Take 1 tablet (5 mg total) by mouth every evening. For allergies/ fluid in ears, Disp: 90 tablet, Rfl: 3   levothyroxine  (SYNTHROID ) 100 MCG tablet, Take 1 tablet (100 mcg total) by mouth daily before breakfast., Disp: 90 tablet, Rfl: 3   losartan  (COZAAR ) 50 MG tablet, Take 1 tablet (50 mg total) by mouth daily., Disp:  90 tablet, Rfl: 3   metFORMIN  (GLUCOPHAGE ) 1000 MG tablet, Take 1 tablet (1,000 mg total) by mouth daily with breakfast. **formula change, Disp: 90 tablet, Rfl: 3   pantoprazole  (PROTONIX ) 40 MG tablet, TAKE ONE TABLET ONCE DAILY, Disp: 60 tablet, Rfl: 1   Potassium 99 MG TABS, Take 99 mg by mouth in the morning., Disp: , Rfl:    sennosides-docusate sodium  (SENOKOT-S) 8.6-50 MG tablet, Take 1 tablet by mouth in the morning., Disp: , Rfl:    tretinoin  (RETIN-A ) 0.05 % cream, APPLY TO AFFECTED AREAS AT BEDTIME, Disp: 45 g, Rfl: 3

## 2024-07-05 NOTE — Telephone Encounter (Signed)
 Left message advising patient that appointment has been switched to virtual per patient request.
